# Patient Record
Sex: Male | Born: 1955
Health system: Southern US, Community
[De-identification: ages and names within clinical notes are randomized; demographics above are authoritative.]

## PROBLEM LIST (undated history)

## (undated) DIAGNOSIS — F79 Unspecified intellectual disabilities: Secondary | ICD-10-CM

## (undated) DIAGNOSIS — F419 Anxiety disorder, unspecified: Secondary | ICD-10-CM

## (undated) DIAGNOSIS — I2510 Atherosclerotic heart disease of native coronary artery without angina pectoris: Secondary | ICD-10-CM

## (undated) DIAGNOSIS — K429 Umbilical hernia without obstruction or gangrene: Secondary | ICD-10-CM

## (undated) DIAGNOSIS — R479 Unspecified speech disturbances: Secondary | ICD-10-CM

## (undated) DIAGNOSIS — M1611 Unilateral primary osteoarthritis, right hip: Secondary | ICD-10-CM

## (undated) DIAGNOSIS — Z9911 Dependence on respirator [ventilator] status: Secondary | ICD-10-CM

## (undated) DIAGNOSIS — M1990 Unspecified osteoarthritis, unspecified site: Secondary | ICD-10-CM

## (undated) DIAGNOSIS — F801 Expressive language disorder: Secondary | ICD-10-CM

## (undated) DIAGNOSIS — I10 Essential (primary) hypertension: Secondary | ICD-10-CM

## (undated) DIAGNOSIS — Q048 Other specified congenital malformations of brain: Secondary | ICD-10-CM

## (undated) DIAGNOSIS — M19012 Primary osteoarthritis, left shoulder: Secondary | ICD-10-CM

## (undated) DIAGNOSIS — M1612 Unilateral primary osteoarthritis, left hip: Secondary | ICD-10-CM

## (undated) DIAGNOSIS — K5900 Constipation, unspecified: Secondary | ICD-10-CM

## (undated) DIAGNOSIS — I7121 Aneurysm of the ascending aorta, without rupture: Secondary | ICD-10-CM

## (undated) DIAGNOSIS — R4189 Other symptoms and signs involving cognitive functions and awareness: Secondary | ICD-10-CM

## (undated) DIAGNOSIS — Z87442 Personal history of urinary calculi: Secondary | ICD-10-CM

## (undated) DIAGNOSIS — N201 Calculus of ureter: Secondary | ICD-10-CM

## (undated) HISTORY — PX: TONSILLECTOMY: SUR1361

## (undated) HISTORY — DX: Dependence on respirator (ventilator) status: Z99.11

## (undated) HISTORY — PX: BACK SURGERY: SHX140

---

## 2005-02-18 ENCOUNTER — Encounter: Admission: RE | Admit: 2005-02-18 | Discharge: 2005-02-18 | Payer: Self-pay | Admitting: General Surgery

## 2005-03-04 ENCOUNTER — Ambulatory Visit (HOSPITAL_BASED_OUTPATIENT_CLINIC_OR_DEPARTMENT_OTHER): Admission: RE | Admit: 2005-03-04 | Discharge: 2005-03-04 | Payer: Self-pay | Admitting: General Surgery

## 2005-03-04 ENCOUNTER — Ambulatory Visit (HOSPITAL_COMMUNITY): Admission: RE | Admit: 2005-03-04 | Discharge: 2005-03-04 | Payer: Self-pay | Admitting: General Surgery

## 2005-03-04 HISTORY — PX: INGUINAL HERNIA REPAIR: SUR1180

## 2006-10-19 ENCOUNTER — Encounter: Admission: RE | Admit: 2006-10-19 | Discharge: 2006-10-19 | Payer: Self-pay | Admitting: Internal Medicine

## 2009-03-18 ENCOUNTER — Encounter: Admission: RE | Admit: 2009-03-18 | Discharge: 2009-03-18 | Payer: Self-pay | Admitting: Gastroenterology

## 2009-10-12 HISTORY — PX: TOTAL SHOULDER ARTHROPLASTY: SHX126

## 2010-08-05 ENCOUNTER — Encounter: Admission: RE | Admit: 2010-08-05 | Discharge: 2010-08-05 | Payer: Self-pay | Admitting: Orthopedic Surgery

## 2010-08-26 ENCOUNTER — Inpatient Hospital Stay (HOSPITAL_COMMUNITY): Admission: RE | Admit: 2010-08-26 | Discharge: 2010-08-28 | Payer: Self-pay | Admitting: Orthopedic Surgery

## 2010-08-26 HISTORY — PX: OTHER SURGICAL HISTORY: SHX169

## 2010-12-23 LAB — BASIC METABOLIC PANEL
BUN: 8 mg/dL (ref 6–23)
CO2: 27 mEq/L (ref 19–32)
Chloride: 102 mEq/L (ref 96–112)
Chloride: 104 mEq/L (ref 96–112)
Chloride: 105 mEq/L (ref 96–112)
Creatinine, Ser: 0.62 mg/dL (ref 0.4–1.5)
GFR calc Af Amer: >60 mL/min (ref >60)
GFR calc Af Amer: >60 mL/min (ref >60)
GFR calc non Af Amer: >60 mL/min (ref >60)
GFR calc non Af Amer: >60 mL/min (ref >60)
Potassium: 4.1 mEq/L (ref 3.5–5.1)
Potassium: 4.2 mEq/L (ref 3.5–5.1)
Potassium: 4.3 mEq/L (ref 3.5–5.1)
Sodium: 135 mEq/L (ref 135–145)
Sodium: 138 mEq/L (ref 135–145)

## 2010-12-23 LAB — SURGICAL PCR SCREEN
MRSA, PCR: NEGATIVE
Staphylococcus aureus: NEGATIVE

## 2010-12-23 LAB — CBC
HCT: 33.6 % — ABNORMAL LOW (ref 39.0–52.0)
HCT: 34.2 % — ABNORMAL LOW (ref 39.0–52.0)
Hemoglobin: 11.9 g/dL — ABNORMAL LOW (ref 13.0–17.0)
Hemoglobin: 14.4 g/dL (ref 13.0–17.0)
MCH: 34.5 pg — ABNORMAL HIGH (ref 26.0–34.0)
MCV: 100.3 fL — ABNORMAL HIGH (ref 78.0–100.0)
MCV: 98.3 fL (ref 78.0–100.0)
MCV: 99 fL (ref 78.0–100.0)
Platelets: 212 10*3/uL (ref 150–400)
RBC: 3.35 MIL/uL — ABNORMAL LOW (ref 4.22–5.81)
RBC: 4.17 MIL/uL — ABNORMAL LOW (ref 4.22–5.81)
RDW: 12.9 % (ref 11.5–15.5)
WBC: 8.4 10*3/uL (ref 4.0–10.5)
WBC: 9.5 10*3/uL (ref 4.0–10.5)

## 2011-02-27 NOTE — Op Note (Signed)
NAME:  Joe [name], Joe [name]               ACCOUNT NO.:  [id number]   MEDICAL RECORD NO.:  [id number]          PATIENT TYPE:  AMB   LOCATION:  [facility]                         FACILITY:  [facility]   PHYSICIAN:  Anselm Pancoast. Zachery Dakins, M.D.DATE OF BIRTH:  [birth year]   DATE OF PROCEDURE:  03/04/2005  DATE OF DISCHARGE:                                 OPERATIVE REPORT   REFERRING PHYSICIAN:  Olene Craven, M.D.   PREOPERATIVE DIAGNOSIS:  Left inguinal hernia.   POSTOPERATIVE DIAGNOSIS:  Left inguinal hernia.   OPERATION:  Left inguinal herniorrhaphy with mesh reinforcement.   ANESTHESIA:  General anesthesia.   SURGEON:  Anselm Pancoast. Zachery Dakins, M.D.   ASSISTANT:  Nurse.   HISTORY:  Javi Bollman is a 55 year old Caucasian male who was referred to  me by Dr. Barbee Shropshire for a symptomatic left inguinal hernia.  Pao is mentally  retarded, has a mild speech impairment, but has no difficulty communicating  with you.  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  Patient has a left bulge that has been present for several weeks  and sometimes his work does require fairly heavy lifting and he is not on  any type of chronic home medications.  His only previous surgery has been a  tonsillectomy.  He denies any difficulty voiding and does not smoke.  Patient lives with his mother but does drive and we are planning to keep him  out of work approximately three weeks because of the heavy nature of his  work.  Patient preoperatively, was taken to the operating suite, induction  of general anesthesia and __________ he had already shaved the groin area.  We had marked the left side, identified the patient and prepped him with  Betadine surgical solution.  Patient has been given a gram of Kefzol and  after draping in a sterile manner, a left inguinal incision area was  infiltrated with 0.5% Marcaine with adrenalin and the ilioinguinal nerve  area was infiltrated with blunted 21-gauge  needle for nerve block.  Sharp  dissection into the skin and subcutaneous tissue.  External oblique  aponeurosis was opened through the external ring.  The cord structures were  elevated and there was a definite large lipoma herniated through the  internal ring area and after this had been removed and the pedicle ligated  with 4-0 Vicryl, a indirect hernia sac about two inches in length was  separated from the cord structures, opened and a high sac ligation done was  2-0 Surgilon under direct vision.  This retracted up into the preperitoneal  space nicely.  He has kind of a weakness of the floor and I closed this with  a __________ modified __________ running 2-0 Prolene and then taking it by  suturing the two ends together.  The reinforced internal ring should not be  tight enough to cause any testicular swelling.  A piece of mesh like a sail  slit laterally was then used to reinforce the floor starting at the  symphysis pubis with a running 2-0 Prolene and the two tails were sutured  together laterally reinforcing the internal ring.  The superior flap was  sutured down with interrupted 2-0 Vicryl and the ilioinguinal nerve comes up  much higher than the typical and I placed the tail of the mesh over it, not  really elevating and trying to bring this out through the recreated internal  ring area.  The mesh is lying flat and not under excessive tension and then  the external oblique was closed with running 3-0 Vicryl.  Scarpa's fascia closed with interrupted 3-0 Vicryl, 4-0 Dexon subcuticular  and Benzoin and Steri-Strips on the skin.  The patient tolerated the  procedure nicely.  Sterile occlusive dressing has been applied.  He was sent  to the recovery room in a stable postoperative condition.      WJW/MEDQ  D:  03/04/2005  T:  03/04/2005  Job:  [id number]   cc:   Olene Craven, M.D.  [address]  [address]  [place]  [place] [address]  Fax: [phone]

## 2011-05-13 ENCOUNTER — Inpatient Hospital Stay (HOSPITAL_COMMUNITY): Admission: RE | Admit: 2011-05-13 | Payer: Medicare Other | Source: Ambulatory Visit | Admitting: Orthopedic Surgery

## 2011-08-18 ENCOUNTER — Encounter (HOSPITAL_COMMUNITY): Payer: Self-pay | Admitting: Pharmacy Technician

## 2011-08-19 ENCOUNTER — Other Ambulatory Visit: Payer: Self-pay

## 2011-08-19 ENCOUNTER — Other Ambulatory Visit: Payer: Self-pay | Admitting: Orthopedic Surgery

## 2011-08-19 ENCOUNTER — Encounter (HOSPITAL_COMMUNITY)
Admission: RE | Admit: 2011-08-19 | Discharge: 2011-08-19 | Disposition: A | Discharge status: Home / Self Care | Payer: Medicare Other | Source: Ambulatory Visit | Attending: Orthopedic Surgery | Admitting: Orthopedic Surgery

## 2011-08-19 ENCOUNTER — Encounter (HOSPITAL_COMMUNITY): Payer: Self-pay

## 2011-08-19 HISTORY — DX: Anxiety disorder, unspecified: F41.9

## 2011-08-19 HISTORY — DX: Unspecified osteoarthritis, unspecified site: M19.90

## 2011-08-19 LAB — CBC
HCT: 41.8 % (ref 39.0–52.0)
MCH: 35 pg — ABNORMAL HIGH (ref 26.0–34.0)
MCHC: 35.9 g/dL (ref 30.0–36.0)
MCV: 97.4 fL (ref 78.0–100.0)
Platelets: 258 10*3/uL (ref 150–400)
RDW: 12.5 % (ref 11.5–15.5)
WBC: 7.1 10*3/uL (ref 4.0–10.5)

## 2011-08-19 LAB — DIFFERENTIAL
Lymphocytes Relative: 24 % (ref 12–46)
Monocytes Absolute: 0.5 10*3/uL (ref 0.1–1.0)
Monocytes Relative: 7 % (ref 3–12)
Neutro Abs: 4.9 10*3/uL (ref 1.7–7.7)
Neutrophils Relative %: 69 % (ref 43–77)

## 2011-08-19 LAB — COMPREHENSIVE METABOLIC PANEL
AST: 15 U/L (ref 0–37)
Albumin: 4.4 g/dL (ref 3.5–5.2)
BUN: 16 mg/dL (ref 6–23)
Calcium: 10.5 mg/dL (ref 8.4–10.5)
Creatinine, Ser: 0.66 mg/dL (ref 0.50–1.35)
Total Protein: 7.6 g/dL (ref 6.0–8.3)

## 2011-08-19 LAB — URINALYSIS, ROUTINE W REFLEX MICROSCOPIC
Glucose, UA: NEGATIVE mg/dL
Hgb urine dipstick: NEGATIVE
Ketones, ur: 15 mg/dL — AB
Leukocytes, UA: NEGATIVE
Protein, ur: NEGATIVE mg/dL
Urobilinogen, UA: 0.2 mg/dL (ref 0.0–1.0)

## 2011-08-19 LAB — PROTIME-INR: INR: 1.03 (ref 0.00–1.49)

## 2011-08-19 LAB — SURGICAL PCR SCREEN: MRSA, PCR: NEGATIVE

## 2011-08-19 LAB — TYPE AND SCREEN
ABO/RH(D): A POS
Antibody Screen: NEGATIVE

## 2011-08-19 NOTE — Pre-Procedure Instructions (Signed)
20 Dina A Bodin  08/19/2011   Your procedure is scheduled on:  08/27/2011  Report to [facility] at 5:30 AM.  Call this number if you have problems the morning of surgery: [phone]   Remember: Bring Incentive Spirometer back in suitcase day of surgery.   Do not eat food:After Midnight.  Do not drink clear liquids: 4 Hours before arrival.  Take these medicines the morning of surgery with A SIP OF WATER: Oxycodone   Do not wear jewelry, make-up or nail polish.  Do not wear lotions, powders, or perfumes. You may wear deodorant.  Do not shave 48 hours prior to surgery.  Do not bring valuables to the hospital.  Contacts, dentures or bridgework may not be worn into surgery.  Leave suitcase in the car. After surgery it may be brought to your room.  For patients admitted to the hospital, checkout time is 11:00 AM the day of discharge.   Patients discharged the day of surgery will not be allowed to drive home.  Name and phone number of your driver: [name] mother [phone]  Special Instructions: CHG Shower Use Special Wash: 1/2 bottle night before surgery and 1/2 bottle morning of surgery.   Please read over the following fact sheets that you were given: Pain Booklet, Coughing and Deep Breathing, Blood Transfusion Information, MRSA Information and Surgical Site Infection Prevention

## 2011-08-26 ENCOUNTER — Encounter (HOSPITAL_COMMUNITY): Admission: RE | Payer: Self-pay | Source: Ambulatory Visit

## 2011-08-26 ENCOUNTER — Inpatient Hospital Stay (HOSPITAL_COMMUNITY): Admission: RE | Admit: 2011-08-26 | Payer: Medicare Other | Source: Ambulatory Visit | Admitting: Orthopedic Surgery

## 2011-08-26 SURGERY — POSTERIOR LUMBAR FUSION 2 LEVEL
Anesthesia: General

## 2011-08-27 ENCOUNTER — Inpatient Hospital Stay (HOSPITAL_COMMUNITY): Payer: Medicare Other | Admitting: Anesthesiology

## 2011-08-27 ENCOUNTER — Inpatient Hospital Stay (HOSPITAL_COMMUNITY): Payer: Medicare Other

## 2011-08-27 ENCOUNTER — Encounter (HOSPITAL_COMMUNITY): Payer: Self-pay | Admitting: Anesthesiology

## 2011-08-27 ENCOUNTER — Inpatient Hospital Stay (HOSPITAL_COMMUNITY)
Admission: RE | Admit: 2011-08-27 | Discharge: 2011-08-29 | DRG: 460 | Disposition: A | Discharge status: Home / Self Care | Payer: Medicare Other | Source: Ambulatory Visit | Attending: Orthopedic Surgery | Admitting: Orthopedic Surgery

## 2011-08-27 ENCOUNTER — Encounter (HOSPITAL_COMMUNITY)
Admission: RE | Disposition: A | Discharge status: Home / Self Care | Payer: Self-pay | Source: Ambulatory Visit | Attending: Orthopedic Surgery

## 2011-08-27 ENCOUNTER — Encounter (HOSPITAL_COMMUNITY): Payer: Self-pay | Admitting: *Deleted

## 2011-08-27 DIAGNOSIS — Z79899 Other long term (current) drug therapy: Secondary | ICD-10-CM

## 2011-08-27 DIAGNOSIS — Q762 Congenital spondylolisthesis: Principal | ICD-10-CM

## 2011-08-27 DIAGNOSIS — F411 Generalized anxiety disorder: Secondary | ICD-10-CM | POA: Diagnosis present

## 2011-08-27 DIAGNOSIS — M79605 Pain in left leg: Secondary | ICD-10-CM | POA: Insufficient documentation

## 2011-08-27 DIAGNOSIS — M48061 Spinal stenosis, lumbar region without neurogenic claudication: Secondary | ICD-10-CM | POA: Diagnosis present

## 2011-08-27 DIAGNOSIS — Z01818 Encounter for other preprocedural examination: Secondary | ICD-10-CM

## 2011-08-27 DIAGNOSIS — Z01811 Encounter for preprocedural respiratory examination: Secondary | ICD-10-CM

## 2011-08-27 DIAGNOSIS — M129 Arthropathy, unspecified: Secondary | ICD-10-CM | POA: Diagnosis present

## 2011-08-27 DIAGNOSIS — Z01812 Encounter for preprocedural laboratory examination: Secondary | ICD-10-CM

## 2011-08-27 DIAGNOSIS — Z01810 Encounter for preprocedural cardiovascular examination: Secondary | ICD-10-CM

## 2011-08-27 DIAGNOSIS — Z96619 Presence of unspecified artificial shoulder joint: Secondary | ICD-10-CM

## 2011-08-27 HISTORY — PX: POSTERIOR LUMBAR FUSION: SHX6036

## 2011-08-27 SURGERY — POSTERIOR LUMBAR FUSION 1 LEVEL
Anesthesia: General | Site: Back | Wound class: Clean

## 2011-08-27 MED ORDER — MENTHOL 3 MG MT LOZG
1.0000 | LOZENGE | OROMUCOSAL | Status: DC | PRN
  Filled 2011-08-27: qty 9

## 2011-08-27 MED ORDER — NALOXONE HCL 0.4 MG/ML IJ SOLN: 0.4000 mg | INTRAMUSCULAR | Status: DC | PRN

## 2011-08-27 MED ORDER — SODIUM CHLORIDE 0.9 % IV SOLN: 250.0000 mL | INTRAVENOUS | Status: DC

## 2011-08-27 MED ORDER — DIPHENHYDRAMINE HCL 12.5 MG/5ML PO ELIX
12.5000 mg | ORAL_SOLUTION | Freq: Four times a day (QID) | ORAL | Status: DC | PRN
  Filled 2011-08-27: qty 5

## 2011-08-27 MED ORDER — POTASSIUM CHLORIDE IN NACL 20-0.9 MEQ/L-% IV SOLN
INTRAVENOUS | Status: DC
  Filled 2011-08-27 (×4): qty 1000

## 2011-08-27 MED ORDER — ALBUMIN HUMAN 25 % IV SOLN: INTRAVENOUS | Status: DC | PRN

## 2011-08-27 MED ORDER — OXYCODONE-ACETAMINOPHEN 5-325 MG PO TABS
1.0000 | ORAL_TABLET | ORAL | Status: DC | PRN
  Administered 2011-08-27 – 2011-08-29 (×5): 2 via ORAL
  Filled 2011-08-27 (×5): qty 2

## 2011-08-27 MED ORDER — SODIUM CHLORIDE 0.9 % IJ SOLN
3.0000 mL | Freq: Two times a day (BID) | INTRAMUSCULAR | Status: DC
  Administered 2011-08-27 – 2011-08-28 (×3): 3 mL via INTRAVENOUS

## 2011-08-27 MED ORDER — ONDANSETRON HCL 4 MG/2ML IJ SOLN
INTRAMUSCULAR | Status: DC | PRN
  Administered 2011-08-27: 4 mg via INTRAVENOUS

## 2011-08-27 MED ORDER — SODIUM CHLORIDE 0.9 % IV SOLN
INTRAVENOUS | Status: DC | PRN
  Administered 2011-08-27: 11:00:00 via INTRAVENOUS

## 2011-08-27 MED ORDER — PHENYLEPHRINE HCL 10 MG/ML IJ SOLN
20.0000 mg | INTRAVENOUS | Status: DC | PRN
  Administered 2011-08-27: 50 ug/min via INTRAVENOUS

## 2011-08-27 MED ORDER — NEOSTIGMINE METHYLSULFATE 1 MG/ML IJ SOLN
INTRAMUSCULAR | Status: DC | PRN
  Administered 2011-08-27: 4 mg via INTRAVENOUS

## 2011-08-27 MED ORDER — HEMOSTATIC AGENTS (NO CHARGE) OPTIME
TOPICAL | Status: DC | PRN
  Administered 2011-08-27 (×3): 1 via TOPICAL

## 2011-08-27 MED ORDER — DIPHENHYDRAMINE HCL 50 MG/ML IJ SOLN: 12.5000 mg | Freq: Four times a day (QID) | INTRAMUSCULAR | Status: DC | PRN

## 2011-08-27 MED ORDER — SODIUM CHLORIDE 0.9 % IJ SOLN: 9.0000 mL | INTRAMUSCULAR | Status: DC | PRN

## 2011-08-27 MED ORDER — MAGNESIUM HYDROXIDE 400 MG/5ML PO SUSP: 30.0000 mL | Freq: Two times a day (BID) | ORAL | Status: DC | PRN

## 2011-08-27 MED ORDER — ROCURONIUM BROMIDE 100 MG/10ML IV SOLN
INTRAVENOUS | Status: DC | PRN
  Administered 2011-08-27: 30 mg via INTRAVENOUS
  Administered 2011-08-27: 50 mg via INTRAVENOUS
  Administered 2011-08-27: 10 mg via INTRAVENOUS
  Administered 2011-08-27 (×2): 20 mg via INTRAVENOUS

## 2011-08-27 MED ORDER — CEFAZOLIN SODIUM 1-5 GM-% IV SOLN
INTRAVENOUS | Status: DC | PRN
  Administered 2011-08-27: 2 g via INTRAVENOUS

## 2011-08-27 MED ORDER — PROPOFOL 10 MG/ML IV EMUL
INTRAVENOUS | Status: DC | PRN
  Administered 2011-08-27: 180 mg via INTRAVENOUS

## 2011-08-27 MED ORDER — THROMBIN 20000 UNITS EX KIT
PACK | CUTANEOUS | Status: DC | PRN
  Administered 2011-08-27: 09:00:00 via TOPICAL

## 2011-08-27 MED ORDER — BUPIVACAINE-EPINEPHRINE 0.25% -1:200000 IJ SOLN
INTRAMUSCULAR | Status: DC | PRN
  Administered 2011-08-27: 6 mL

## 2011-08-27 MED ORDER — ONDANSETRON HCL 4 MG/2ML IJ SOLN: 4.0000 mg | Freq: Four times a day (QID) | INTRAMUSCULAR | Status: DC | PRN

## 2011-08-27 MED ORDER — ACETAMINOPHEN 650 MG RE SUPP: 650.0000 mg | RECTAL | Status: DC | PRN

## 2011-08-27 MED ORDER — SENNA 8.6 MG PO TABS
1.0000 | ORAL_TABLET | Freq: Two times a day (BID) | ORAL | Status: DC
  Administered 2011-08-27 – 2011-08-29 (×4): 8.6 mg via ORAL
  Filled 2011-08-27 (×6): qty 1

## 2011-08-27 MED ORDER — ALUM & MAG HYDROXIDE-SIMETH 400-400-40 MG/5ML PO SUSP
30.0000 mL | Freq: Four times a day (QID) | ORAL | Status: DC | PRN
  Filled 2011-08-27: qty 30

## 2011-08-27 MED ORDER — ACETAMINOPHEN 325 MG PO TABS: 650.0000 mg | ORAL_TABLET | ORAL | Status: DC | PRN

## 2011-08-27 MED ORDER — MORPHINE SULFATE (PF) 1 MG/ML IV SOLN
INTRAVENOUS | Status: DC
  Administered 2011-08-27: 1 mg via INTRAVENOUS
  Administered 2011-08-27: 15:00:00 via INTRAVENOUS
  Administered 2011-08-28: 1 mg via INTRAVENOUS

## 2011-08-27 MED ORDER — ACETAMINOPHEN 10 MG/ML IV SOLN
1000.0000 mg | Freq: Four times a day (QID) | INTRAVENOUS | Status: DC
  Administered 2011-08-27: 1000 mg via INTRAVENOUS
  Filled 2011-08-27 (×4): qty 100

## 2011-08-27 MED ORDER — SODIUM CHLORIDE 0.9 % IR SOLN
Status: DC | PRN
  Administered 2011-08-27 (×2): 1000 mL

## 2011-08-27 MED ORDER — MORPHINE SULFATE 2 MG/ML IJ SOLN: 2.0000 mg | INTRAMUSCULAR | Status: DC | PRN

## 2011-08-27 MED ORDER — HYDROXYZINE HCL 50 MG PO TABS
50.0000 mg | ORAL_TABLET | ORAL | Status: DC | PRN
  Filled 2011-08-27: qty 1

## 2011-08-27 MED ORDER — LACTATED RINGERS IV SOLN
INTRAVENOUS | Status: DC | PRN
  Administered 2011-08-27 (×2): via INTRAVENOUS

## 2011-08-27 MED ORDER — HYDROMORPHONE HCL PF 1 MG/ML IJ SOLN
0.2500 mg | INTRAMUSCULAR | Status: DC | PRN
  Filled 2011-08-27: qty 0.5

## 2011-08-27 MED ORDER — HYDROXYZINE HCL 50 MG/ML IM SOLN
50.0000 mg | INTRAMUSCULAR | Status: DC | PRN
  Filled 2011-08-27: qty 1

## 2011-08-27 MED ORDER — MIDAZOLAM HCL 5 MG/5ML IJ SOLN
INTRAMUSCULAR | Status: DC | PRN
  Administered 2011-08-27: 2 mg via INTRAVENOUS

## 2011-08-27 MED ORDER — LIDOCAINE HCL (CARDIAC) 20 MG/ML IV SOLN
INTRAVENOUS | Status: DC | PRN
  Administered 2011-08-27: 100 mg via INTRAVENOUS

## 2011-08-27 MED ORDER — DIAZEPAM 5 MG PO TABS
5.0000 mg | ORAL_TABLET | ORAL | Status: DC
  Administered 2011-08-27 – 2011-08-29 (×11): 5 mg via ORAL
  Filled 2011-08-27 (×11): qty 1

## 2011-08-27 MED ORDER — ZOLPIDEM TARTRATE 5 MG PO TABS: 5.0000 mg | ORAL_TABLET | Freq: Every evening | ORAL | Status: DC | PRN

## 2011-08-27 MED ORDER — LACTATED RINGERS IV SOLN
INTRAVENOUS | Status: DC | PRN
  Administered 2011-08-27 (×2): via INTRAVENOUS

## 2011-08-27 MED ORDER — CEFAZOLIN SODIUM 1-5 GM-% IV SOLN
1.0000 g | Freq: Three times a day (TID) | INTRAVENOUS | Status: AC
  Administered 2011-08-27 – 2011-08-28 (×2): 1 g via INTRAVENOUS
  Filled 2011-08-27 (×2): qty 50

## 2011-08-27 MED ORDER — PHENOL 1.4 % MT LIQD
1.0000 | OROMUCOSAL | Status: DC | PRN
  Filled 2011-08-27: qty 177

## 2011-08-27 MED ORDER — ALBUMIN HUMAN 5 % IV SOLN: INTRAVENOUS | Status: DC | PRN

## 2011-08-27 MED ORDER — DEXTROSE 5 % IV SOLN
INTRAVENOUS | Status: DC | PRN
  Administered 2011-08-27 (×2): via INTRAVENOUS

## 2011-08-27 MED ORDER — GLYCOPYRROLATE 0.2 MG/ML IJ SOLN
INTRAMUSCULAR | Status: DC | PRN
  Administered 2011-08-27: .8 mg via INTRAVENOUS
  Administered 2011-08-27: .4 mg via INTRAVENOUS

## 2011-08-27 MED ORDER — SODIUM CHLORIDE 0.9 % IV SOLN: INTRAVENOUS | Status: DC

## 2011-08-27 MED ORDER — FENTANYL CITRATE 0.05 MG/ML IJ SOLN
INTRAMUSCULAR | Status: DC | PRN
  Administered 2011-08-27: 75 ug via INTRAVENOUS
  Administered 2011-08-27: 50 ug via INTRAVENOUS
  Administered 2011-08-27: 125 ug via INTRAVENOUS

## 2011-08-27 MED ORDER — DEXAMETHASONE SODIUM PHOSPHATE 10 MG/ML IJ SOLN
INTRAMUSCULAR | Status: DC | PRN
  Administered 2011-08-27: 10 mg via INTRAVENOUS

## 2011-08-27 MED ORDER — DOCUSATE SODIUM 100 MG PO CAPS
100.0000 mg | ORAL_CAPSULE | Freq: Two times a day (BID) | ORAL | Status: DC
  Administered 2011-08-27 – 2011-08-28 (×3): 100 mg via ORAL
  Filled 2011-08-27 (×4): qty 1

## 2011-08-27 MED ORDER — SODIUM CHLORIDE 0.9 % IJ SOLN: 3.0000 mL | INTRAMUSCULAR | Status: DC | PRN

## 2011-08-27 MED ORDER — ALBUMIN HUMAN 5 % IV SOLN
INTRAVENOUS | Status: DC | PRN
  Administered 2011-08-27 (×2): via INTRAVENOUS

## 2011-08-27 SURGICAL SUPPLY — 80 items
BENZOIN TINCTURE PRP APPL 2/3 (GAUZE/BANDAGES/DRESSINGS) ×2 IMPLANT
BLADE SURG ROTATE 9660 (MISCELLANEOUS) IMPLANT
BUR ROUND PRECISION 4.0 (BURR) ×2 IMPLANT
CARTRIDGE OIL MAESTRO DRILL (MISCELLANEOUS) IMPLANT
CLOTH BEACON ORANGE TIMEOUT ST (SAFETY) ×2 IMPLANT
CONT SPEC STER OR (MISCELLANEOUS) ×2 IMPLANT
CORDS BIPOLAR (ELECTRODE) ×2 IMPLANT
COVER SURGICAL LIGHT HANDLE (MISCELLANEOUS) ×2 IMPLANT
DIFFUSER DRILL AIR PNEUMATIC (MISCELLANEOUS) ×2 IMPLANT
DRAIN CHANNEL 15F RND FF W/TCR (WOUND CARE) ×2 IMPLANT
DRAPE C-ARM 42X72 X-RAY (DRAPES) ×2 IMPLANT
DRAPE ORTHO SPLIT 77X108 STRL (DRAPES) ×1
DRAPE POUCH INSTRU U-SHP 10X18 (DRAPES) ×2 IMPLANT
DRAPE SURG 17X23 STRL (DRAPES) ×6 IMPLANT
DRAPE SURG ORHT 6 SPLT 77X108 (DRAPES) ×1 IMPLANT
DURAPREP 26ML APPLICATOR (WOUND CARE) ×2 IMPLANT
ELECT BLADE 4.0 EZ CLEAN MEGAD (MISCELLANEOUS) ×2
ELECT CAUTERY BLADE 6.4 (BLADE) ×2 IMPLANT
ELECT REM PT RETURN 9FT ADLT (ELECTROSURGICAL) ×2
ELECTRODE BLDE 4.0 EZ CLN MEGD (MISCELLANEOUS) ×1 IMPLANT
ELECTRODE REM PT RTRN 9FT ADLT (ELECTROSURGICAL) ×1 IMPLANT
EVACUATOR SILICONE 100CC (DRAIN) ×2 IMPLANT
GAUZE SPONGE 4X4 12PLY STRL LF (GAUZE/BANDAGES/DRESSINGS) ×2 IMPLANT
GAUZE SPONGE 4X4 16PLY XRAY LF (GAUZE/BANDAGES/DRESSINGS) ×4 IMPLANT
GLOVE BIO SURGEON STRL SZ 6.5 (GLOVE) ×4 IMPLANT
GLOVE BIO SURGEON STRL SZ7.5 (GLOVE) ×4 IMPLANT
GLOVE BIO SURGEON STRL SZ8 (GLOVE) ×6 IMPLANT
GLOVE BIOGEL M 7.0 STRL (GLOVE) ×2 IMPLANT
GLOVE BIOGEL PI IND STRL 6.5 (GLOVE) ×1 IMPLANT
GLOVE BIOGEL PI IND STRL 7.5 (GLOVE) ×3 IMPLANT
GLOVE BIOGEL PI IND STRL 8 (GLOVE) ×1 IMPLANT
GLOVE BIOGEL PI INDICATOR 6.5 (GLOVE) ×1
GLOVE BIOGEL PI INDICATOR 7.5 (GLOVE) ×3
GLOVE BIOGEL PI INDICATOR 8 (GLOVE) ×1
GLOVE SURG SS PI 7.5 STRL IVOR (GLOVE) ×6 IMPLANT
GOWN PREVENTION PLUS XLARGE (GOWN DISPOSABLE) ×8 IMPLANT
GOWN STRL NON-REIN LRG LVL3 (GOWN DISPOSABLE) ×2 IMPLANT
IV CATH 14GX2 1/4 (CATHETERS) ×2 IMPLANT
KIT BASIN OR (CUSTOM PROCEDURE TRAY) ×2 IMPLANT
KIT POSITION SURG JACKSON T1 (MISCELLANEOUS) IMPLANT
KIT ROOM TURNOVER OR (KITS) ×2 IMPLANT
MARKER SKIN DUAL TIP RULER LAB (MISCELLANEOUS) ×2 IMPLANT
MILL MEDIUM DISP (BLADE) ×2 IMPLANT
NEEDLE BONE MARROW 8GX6 FENEST (NEEDLE) ×2 IMPLANT
NEEDLE HYPO 25GX1X1/2 BEV (NEEDLE) ×2 IMPLANT
NEEDLE SPNL 18GX3.5 QUINCKE PK (NEEDLE) ×2 IMPLANT
NS IRRIG 1000ML POUR BTL (IV SOLUTION) ×2 IMPLANT
OIL CARTRIDGE MAESTRO DRILL (MISCELLANEOUS)
PACK LAMINECTOMY ORTHO (CUSTOM PROCEDURE TRAY) ×2 IMPLANT
PACK UNIVERSAL I (CUSTOM PROCEDURE TRAY) ×2 IMPLANT
PACK VITOSS BIOACTIVE 5CC (Neuro Prosthesis/Implant) ×2 IMPLANT
PAD ARMBOARD 7.5X6 YLW CONV (MISCELLANEOUS) ×4 IMPLANT
PATTIES SURGICAL .5 X1 (DISPOSABLE) ×2 IMPLANT
PATTIES SURGICAL .5X1.5 (GAUZE/BANDAGES/DRESSINGS) IMPLANT
ROD EXPEDIUM PREBENT 5.5X30MM (Rod) ×2 IMPLANT
ROD EXPEDIUM PREBENT 5.5X35MM (Rod) ×2 IMPLANT
SCREW EXPEDIUM POLYAXIAL 7X40M (Screw) ×2 IMPLANT
SCREW POLYAXIAL 7X45MM (Screw) ×6 IMPLANT
SCREW SET SINGLE INNER (Screw) ×8 IMPLANT
SLEEVE SURGEON STRL (DRAPES) ×2 IMPLANT
SPONGE GAUZE 4X4 12PLY (GAUZE/BANDAGES/DRESSINGS) ×2 IMPLANT
SPONGE INTESTINAL PEANUT (DISPOSABLE) ×2 IMPLANT
SPONGE SURGIFOAM ABS GEL 100 (HEMOSTASIS) ×2 IMPLANT
STRIP CLOSURE SKIN 1/2X4 (GAUZE/BANDAGES/DRESSINGS) ×2 IMPLANT
SURGIFLO TRUKIT (HEMOSTASIS) IMPLANT
SUT MNCRL AB 3-0 PS2 18 (SUTURE) IMPLANT
SUT MNCRL AB 4-0 PS2 18 (SUTURE) ×2 IMPLANT
SUT VIC AB 0 CT1 18XCR BRD 8 (SUTURE) ×1 IMPLANT
SUT VIC AB 0 CT1 8-18 (SUTURE) ×1
SUT VIC AB 1 CT1 18XCR BRD 8 (SUTURE) ×1 IMPLANT
SUT VIC AB 1 CT1 8-18 (SUTURE) ×1
SUT VIC AB 2-0 CT2 18 VCP726D (SUTURE) ×4 IMPLANT
SYR 20CC LL (SYRINGE) ×2 IMPLANT
SYR BULB IRRIGATION 50ML (SYRINGE) ×2 IMPLANT
SYR CONTROL 10ML LL (SYRINGE) ×6 IMPLANT
TOWEL OR 17X24 6PK STRL BLUE (TOWEL DISPOSABLE) ×2 IMPLANT
TOWEL OR 17X26 10 PK STRL BLUE (TOWEL DISPOSABLE) ×2 IMPLANT
TRAY FOLEY CATH 14FR (SET/KITS/TRAYS/PACK) ×2 IMPLANT
WATER STERILE IRR 1000ML POUR (IV SOLUTION) ×2 IMPLANT
YANKAUER SUCT BULB TIP NO VENT (SUCTIONS) ×2 IMPLANT

## 2011-08-27 NOTE — H&P (Signed)
PREOPERATIVE H&P  Chief Complaint: left leg pain  HPI: Joe [name] is a 55 y.o. male who presents with left leg pain  Past Medical History  Diagnosis Date  . Arthritis   . Anxiety     over surgery   Past Surgical History  Procedure Date  . Shoulder surgery 08/2010    Replacement   History   Social History  . Marital Status: Single    Spouse Name: N/A    Number of Children: N/A  . Years of Education: N/A   Social History Main Topics  . Smoking status: Never Smoker   . Smokeless tobacco: None  . Alcohol Use:   . Drug Use:   . Sexually Active:    Other Topics Concern  . None   Social History Narrative  . None   History reviewed. No pertinent family history. No Known Allergies Prior to Admission medications   Medication Sig Start Date End Date Taking? Authorizing Provider  oxyCODONE-acetaminophen (PERCOCET) 5-325 MG per tablet Take 1 tablet by mouth every 4 (four) hours as needed. For pain    Yes Historical Provider, MD  naproxen sodium (ANAPROX) 220 MG tablet Take 220 mg by mouth daily as needed. For pain     Historical Provider, MD     All other systems have been reviewed and were otherwise negative with the exception of those mentioned in the HPI and as above.  Physical Exam: Filed Vitals:   08/27/11 0629  BP: 139/69  Pulse: 68  Temp: 98.3 F (36.8 C)  Resp: 18    General: Alert, no acute distress Cardiovascular: No pedal edema Respiratory: No cyanosis, no use of accessory musculature GI: No organomegaly, abdomen is soft and non-tender Skin: No lesions in the area of chief complaint Neurologic: Sensation intact distally Psychiatric: Patient is competent for consent with normal mood and affect Lymphatic: No axillary or cervical lymphadenopathy  MUSCULOSKELETAL: SLR on left   Assessment/Plan: left leg pain Plan for Procedure(s): L4-S1 decompression, L4/5 PSF with instrumentation   Ilias Stcharles,Sanders LEONARD 08/27/2011 7:07 AM

## 2011-08-27 NOTE — Progress Notes (Signed)
Utilization review completed.  

## 2011-08-27 NOTE — Preoperative (Signed)
Beta Blockers   Reason not to administer Beta Blockers:Not Applicable 

## 2011-08-27 NOTE — Anesthesia Preprocedure Evaluation (Addendum)
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  Performs ADL's w/o diffficulty.  Expressive delay.  Pt's mother said Romario completed [employer] in "Special Needs" Class. Negative Psych ROS   GI/Hepatic negative GI ROS, Neg liver ROS,   Endo/Other  Negative Endocrine ROS  Renal/GU negative Renal ROS  Genitourinary negative   Musculoskeletal negative musculoskeletal ROS (+) Arthritis -,   Abdominal   Peds  Hematology negative hematology ROS (+)   Anesthesia Other Findings   Reproductive/Obstetrics                        Anesthesia Physical Anesthesia Plan  ASA: II  Anesthesia Plan: General   Post-op Pain Management:    Induction: Intravenous  Airway Management Planned: Oral ETT  Additional Equipment:   Intra-op Plan:   Post-operative Plan: Extubation in OR  Informed Consent: I have reviewed the patients History and Physical, chart, labs and discussed the procedure including the risks, benefits and alternatives for the proposed anesthesia with the patient or authorized representative who has indicated his/her understanding and acceptance.   Dental advisory given  Plan Discussed with: CRNA and Surgeon  Anesthesia Plan Comments:        Anesthesia Quick Evaluation

## 2011-08-27 NOTE — Anesthesia Postprocedure Evaluation (Signed)
Anesthesia Post Note  Patient: Joe [name]  Procedure(s) Performed:  POSTERIOR LUMBAR FUSION 1 LEVEL - Laminectomy Lumbar Decompression L4-5 L5-S1 Lumbar Fusion L4-5 and Lumbar Fusion L4-5 with Bone Marrow Asprite  Anesthesia type: General  Patient location: PACU  Post pain: Pain level controlled and Adequate analgesia  Post assessment: Post-op Vital signs reviewed, Patient's Cardiovascular Status Stable, Respiratory Function Stable, Patent Airway and Pain level controlled  Last Vitals:  Filed Vitals:   08/27/11 1311  BP:   Pulse:   Temp: 37.3 C  Resp:     Post vital signs: Reviewed and stable  Level of consciousness: awake, alert  and oriented  Complications: No apparent anesthesia complications

## 2011-08-27 NOTE — Transfer of Care (Signed)
Immediate Anesthesia Transfer of Care Note  Patient: Joe [name]  Procedure(s) Performed:  POSTERIOR LUMBAR FUSION 1 LEVEL - Laminectomy Lumbar Decompression L4-5 L5-S1 Lumbar Fusion L4-5 and Lumbar Fusion L4-5 with Bone Marrow Asprite  Patient Location: PACU  Anesthesia Type: General  Level of Consciousness: awake, alert , oriented and patient cooperative  Airway & Oxygen Therapy: Patient Spontanous Breathing and Patient connected to nasal cannula oxygen  Post-op Assessment: Report given to PACU RN, Post -op Vital signs reviewed and stable and Patient moving all extremities  Post vital signs: Reviewed and stable  Complications: No apparent anesthesia complications

## 2011-08-27 NOTE — Op Note (Signed)
Joe [name], Joe [name]               ACCOUNT NO.:  [id number]  MEDICAL RECORD NO.:  [id number]  LOCATION:  5030                         FACILITY:  [facility]  PHYSICIAN:  Estill Bamberg, MD      DATE OF BIRTH:  Apr 18, 1956  DATE OF PROCEDURE:  08/27/2011 DATE OF DISCHARGE:                              OPERATIVE REPORT   PREOPERATIVE DIAGNOSES: 1. L4-5, L5-S1 spinal stenosis. 2. Grade 1 L4-5 spondylolisthesis.  POSTOPERATIVE DIAGNOSES: 1. L4-5, L5-S1 spinal stenosis. 2. Grade 1 L4-5 spondylolisthesis.  PROCEDURES: 1. L4-5, L5-S1 laminectomy, partial facetectomy, and foraminotomy. 2. Posterolateral fusion, L4-5. 3. Placement of posterior instrumentation, L4, L5. 4. Use of local autograft. 5. Intraoperative bone marrow aspiration from the patient's left iliac     crest, using a separate incision. 6. Intraoperative use of fluoroscopy.  SURGEON:  Estill Bamberg, M.D.  ASSISTANT:  Skip Mayer, PA-C.  ANESTHESIA:  General endotracheal anesthesia.  COMPLICATIONS:  None.  DISPOSITION:  Stable.  INSTRUMENTATION USED:  Expedium.  ESTIMATED BLOOD LOSS:  300 mL.  INDICATIONS FOR PROCEDURE:  Briefly, Mr. Imler is an extremely pleasant 55 year old male who presented to my office with severe debilitating pain in his left leg.  I did review an MRI, which was notable for rather profound spinal stenosis at the L4-5 and L5-S1 levels.  There was noted to be excessive facet hypertrophy at both the L4-5 and L5-S1 facets.  This did result in severe lateral recess stenosis bilaterally at L4-5 and left-sided neural foraminal stenosis at L5-S1.  Of note, we did go forward with extensive conservative care and the patient continued to have ongoing debilitating pain.  Of note, prior to the patient's nonoperative treatment, was a L5 selected nerve block on the left side and this did significantly alleviate the patient's symptomatology.  We therefore had a discussion regarding going forward with  two-level decompression and a 1 level fusion as noted above.  The patient fully understood the risks and limitations of the procedure as outlined in my preoperative note.  OPERATIVE DETAILS:  On August 27, 2011, the patient was brought to surgery and general endotracheal anesthesia was administered.  The patient was placed prone on a well-padded flat Jackson bed with a Wilson frame.  SCDs were placed and a time-out procedure was performed. Antibiotics were given.  The back was prepped and draped in usual sterile fashion.  I then made an incision from approximately spinous process of L3 to approximately spinous process of S1.  The fascia was incised using electrocautery at the midline and the paraspinal muscular gently swept laterally.  A lateral fluoroscopic view was obtained to help optimize the trajectory of the pedicle screws.  I then brought in lateral fluoroscopy and I did place a Kocher over the L4 spinous process and a lateral fluoroscopic view was obtained, which helped to confirm the appropriate operative levels.  The lamina of L4, L5 and S1 were subperiosteally exposed as was the transverse processes of L4 and L5 bilaterally.  Of note, there was noted to be excessive and profound hypertrophy of the L4-5 facet joints.  Again, the amount of hypertrophy was significant and extensive amount of this facet hypertrophy was taken down in order  to help optimize the exposure.  I then packed out the posterolateral gutters using Ray-Tec soaked with thrombin.  Prior to this, I did use a 4-mm high-speed bur followed by a gearshift probe to cannulate the L4 and L5 pedicles on both the right and the left sides. A ball-tip Feeler was used to confirm that there was no cortical violation.  A 6-mm tap was utilized and again, a ball-tip probe confirmed there was no cortical violation.  The holes were sealed with bone wax and the posterolateral gutters were packed.  I then went forward with a  thorough central and lateral recess and bilateral neural foraminal decompression at the L4-5 and L5-S1 levels in the usual manner.  At the termination of the decompression, I was easily able to pass a [place] out the neural foramen on both the right and left sides of the L4-5 and L5-S1 levels.  Of particular note, was the left side where it readily apparent that there was excessive and severe lateral recess stenosis secondary to the L4-5 facet hypertrophy.  This was removed in its entirety.  There was also noted to be a significant bone spur at the L5-S1 facet joint, which was causing compression of the exiting L5 nerve at the L5-S1 level and a thorough foraminotomy was performed, which did confirm adequate decompression of the exiting L5 nerve.  I was very happy with the final decompression.  The epidural space was packed with Gelfoam soaked with thrombin.  At this point, the wound was copiously irrigated using approximately 1 L of normal saline. I then turned my attention towards the fusion aspect of the procedure. I did use a 4-mm high-speed bur to decorticate the transverse processes of L4 and L5 bilaterally as well as the posterior elements in addition to the L4-5 facet joint.  I did obtain a 5 mL of the Vitoss BA and this was mixed with 4 mL of bone marrow aspirate that obtained from the patient's left iliac crest using a separate incision.  This was mixed with the autograft.  Autograft was placed in the right posterolateral gutter and bone marrow aspirate/Vitoss/autograft mixture was placed into the left posterolateral gutter.  7 x 45 mm screws were placed bilaterally at L5 and on the left side at L4.  7 x 40 mm screw was placed on the right side at L4.  Each screw was tested using the triggered EMG and each screw tested over 25 milliamps with the exception of the right L5 screw, which was tested at 7 milliamps.  I did obtain multiple fluoroscopic views to visualize the right L5  screw and it was clearly adequately positioned.  A 30-mm rod was placed on the right and a 35-mm rod was placed on the left.  Caps were placed and a final locking procedure was performed bilaterally.  I again obtained AP and lateral fluoroscopic views and I was very happy with the final appearance of the radiographs.  I then went forward on closing the fascia using #1 Vicryl.  The subcutaneous layer was closed using 2-0 Vicryl.  The skin was closed using 3-0 Monocryl.  The iliac crest site was also closed using 3-0 Monocryl.  All instrument counts were correct at the termination of the procedure.  Of note, Skip Mayer was my assistant throughout the procedure and aided in essential retraction and suctioning needed throughout the surgery.     Estill Bamberg, MD     MD/MEDQ  D:  08/27/2011  T:  08/27/2011  Job:  [id number]

## 2011-08-28 MED ORDER — OXYCODONE HCL 10 MG PO TB12
10.0000 mg | ORAL_TABLET | Freq: Two times a day (BID) | ORAL | Status: DC
  Administered 2011-08-28 (×2): 10 mg via ORAL
  Filled 2011-08-28 (×2): qty 1

## 2011-08-28 NOTE — Progress Notes (Signed)
Ortho  Pt comfortable, no leg pain, minimal back pain  AVSS NVI Dressing cdi  POD #1 after 4-1 decompression, 4/5 fusion  D/c pca today Oxycontin, percocet, valium SCDs Up with PT today BID Likely d/c home tomorrow vs. Sunday

## 2011-08-28 NOTE — Progress Notes (Signed)
Occupational Therapy Evaluation Patient Details Name: Joe [name] MRN: [id number] DOB: 09-02-1956 Today's Date: 08/28/2011  Problem List:  Patient Active Problem List  Diagnoses  . Left leg pain    Past Medical History:  Past Medical History  Diagnosis Date  . Arthritis   . Anxiety     over surgery  . Mental disorder    Past Surgical History:  Past Surgical History  Procedure Date  . Shoulder surgery 08/2010    Replacement    OT Assessment/Plan/Recommendation OT Assessment Clinical Impression Statement: Pt will benefit from OT services in the acute care setting to increase I with ADLs and functional transfers in prep for d/c home with mother. OT Recommendation/Assessment: Patient will need skilled OT in the acute care venue OT Problem List: Decreased activity tolerance;Decreased knowledge of use of DME or AE;Decreased knowledge of precautions;Pain OT Therapy Diagnosis : Acute pain OT Plan OT Frequency: Min 2X/week OT Treatment/Interventions: Self-care/ADL training;DME and/or AE instruction;Therapeutic activities;Patient/family education OT Recommendation Follow Up Recommendations: None Equipment Recommended: 3 in 1 bedside comode Individuals Consulted Consulted and Agree with Results and Recommendations: Patient;Family member/caregiver Family Member Consulted: Mother OT Goals Acute Rehab OT Goals OT Goal Formulation: With patient/family Time For Goal Achievement: 7 days ADL Goals Pt Will Perform Grooming: with modified independence;Standing at sink;Other (comment) (while maintaining back safety precautions) ADL Goal: Grooming - Progress: Other (comment) Pt Will Perform Lower Body Bathing: with modified independence;Sit to stand from bed;Other (comment) (while maintaining back safety precautions) ADL Goal: Lower Body Bathing - Progress: Other (comment) Pt Will Perform Lower Body Dressing: with modified independence;Sit to stand from chair;Other (comment) (while  maintaining back safety precautions) ADL Goal: Lower Body Dressing - Progress: Other (comment) Pt Will Transfer to Toilet: with modified independence;Stand pivot transfer;3-in-1;with DME;Maintaining back safety precautions ADL Goal: Toilet Transfer - Progress: Other (comment)  OT Evaluation Precautions/Restrictions  Precautions Precautions: Back Precaution Booklet Issued: Yes (comment) Required Braces or Orthoses: No Prior Functioning Home Living Lives With: Family Receives Help From: Family Type of Home: House Home Layout: Two level Alternate Level Stairs-Rails: Can reach both Alternate Level Stairs-Number of Steps: 15 Home Access: Stairs to enter Entrance Stairs-Rails: None Entrance Stairs-Number of Steps: 2 Bathroom Shower/Tub: Engineer, manufacturing systems: Standard Bathroom Accessibility: Yes How Accessible: Accessible via walker Home Adaptive Equipment: Straight cane Additional Comments: May possibly have RW Prior Function Level of Independence: Independent with basic ADLs;Independent with gait;Independent with transfers Able to Take Stairs?: Yes Driving: Yes Vocation: On disability ADL ADL Eating/Feeding: Simulated;Independent Where Assessed - Eating/Feeding: Chair Grooming: Simulated;Set up Grooming Details (indicate cue type and reason): Setup assist to gather grooming materials. Where Assessed - Grooming: Sitting, chair Upper Body Bathing: Simulated;Set up Upper Body Bathing Details (indicate cue type and reason): Setup assist to gather bathing materials. Where Assessed - Upper Body Bathing: Sitting, chair Lower Body Bathing: Simulated;Minimal assistance Lower Body Bathing Details (indicate cue type and reason): Min assist due to pain Where Assessed - Lower Body Bathing: Sit to stand from chair Upper Body Dressing: Simulated;Independent Where Assessed - Upper Body Dressing: Sitting, chair Lower Body Dressing: Simulated;Minimal assistance Lower Body  Dressing Details (indicate cue type and reason): Min assist due to pain Where Assessed - Lower Body Dressing: Sit to stand from chair Toilet Transfer: Simulated;Minimal assistance Toilet Transfer Method: Ambulating Toileting - Clothing Manipulation: Simulated;Supervision/safety Where Assessed - Toileting Clothing Manipulation: Standing Toileting - Hygiene: Simulated;Supervision/safety Where Assessed - Toileting Hygiene: Standing Tub/Shower Transfer: Not assessed Tub/Shower Transfer Method: Not assessed Equipment Used:  Rolling walker ADL Comments: Pt with decreased I with ADLs due to back pain. Vision/Perception    Cognition Cognition Arousal/Alertness: Awake/alert Overall Cognitive Status: History of cognitive impairments History of Cognitive Impairment: Appears at baseline functioning Orientation Level: Oriented X4 Sensation/Coordination   Extremity Assessment RUE Assessment RUE Assessment: Within Functional Limits LUE Assessment LUE Assessment: Within Functional Limits Mobility  Bed Mobility Bed Mobility: Yes Rolling Left: 4: Min assist Rolling Left Details (indicate cue type and reason): VC for log rolling Left Sidelying to Sit: Not tested (comment) Sitting - Scoot to Edge of Bed: Not tested (comment) Transfers Transfers: Yes Sit to Stand: 5: Supervision Stand to Sit: 5: Supervision Exercises   End of Session OT - End of Session Equipment Utilized During Treatment: Gait belt Activity Tolerance: Patient limited by pain Patient left: in bed;with call bell in reach;with family/visitor present General Behavior During Session: [facility] for tasks performed Cognition: [facility] for tasks performed   Cipriano Mile 08/28/2011, 4:00 PM  08/28/2011 Cipriano Mile OTR/L Pager [phone] Office [phone]

## 2011-08-28 NOTE — Progress Notes (Signed)
Physical Therapy Evaluation Patient Details Name: Joe [name] MRN: [id number] DOB: May 30, 1956 Today's Date: 08/28/2011  Problem List:  Patient Active Problem List  Diagnoses  . Left leg pain    Past Medical History:  Past Medical History  Diagnosis Date  . Arthritis   . Anxiety     over surgery  . Mental disorder    Past Surgical History:  Past Surgical History  Procedure Date  . Shoulder surgery 08/2010    Replacement    PT Assessment/Plan/Recommendation PT Assessment Clinical Impression Statement: Pt presents with a medical diagnosis of L4-5 fusion and L4-S1 decompression along with the following impairments/deficits. Pt will benefit from skilled PT in the acute care setting in order to maximize functional mobility for a safe d/c home. PT Recommendation/Assessment: Patient will need skilled PT in the acute care venue PT Problem List: Decreased balance;Decreased mobility;Decreased safety awareness;Decreased knowledge of precautions;Pain PT Therapy Diagnosis : Difficulty walking PT Plan PT Frequency: Min 5X/week PT Treatment/Interventions: Gait training;Stair training;Patient/family education;Balance training;Therapeutic exercise;Functional mobility training PT Recommendation Follow Up Recommendations: None Equipment Recommended: Rolling walker with 5" wheels PT Goals  Acute Rehab PT Goals PT Goal Formulation: With patient/family Time For Goal Achievement: 7 days Pt will Roll Supine to Left Side: with modified independence PT Goal: Rolling Supine to Left Side - Progress: Progressing toward goal Pt will go Supine/Side to Sit: with modified independence PT Goal: Supine/Side to Sit - Progress: Progressing toward goal Pt will Transfer Sit to Stand/Stand to Sit: with modified independence PT Transfer Goal: Sit to Stand/Stand to Sit - Progress: Progressing toward goal Pt will Transfer Bed to Chair/Chair to Bed: with modified independence PT Transfer Goal: Bed to  Chair/Chair to Bed - Progress: Progressing toward goal Pt will Ambulate: >150 feet;with modified independence;with rolling walker PT Goal: Ambulate - Progress: Progressing toward goal Pt will Go Up / Down Stairs: Flight;with supervision;with rail(s) PT Goal: Up/Down Stairs - Progress: Progressing toward goal  PT Evaluation Precautions/Restrictions  Precautions Precautions: Back Precaution Booklet Issued: Yes (comment) Required Braces or Orthoses: No Prior Functioning  Home Living Lives With: Family Receives Help From: Family Type of Home: House Home Layout: Two level Alternate Level Stairs-Rails: Can reach both Alternate Level Stairs-Number of Steps: 15 Home Access: Stairs to enter Entrance Stairs-Rails: None Entrance Stairs-Number of Steps: 2 Bathroom Shower/Tub: Engineer, manufacturing systems: Standard Bathroom Accessibility: Yes How Accessible: Accessible via walker Home Adaptive Equipment: Straight cane Additional Comments: May possibly have RW Prior Function Level of Independence: Independent with basic ADLs;Independent with gait;Independent with transfers Able to Take Stairs?: Yes Driving: Yes Vocation: On disability Cognition Cognition Arousal/Alertness: Awake/alert Overall Cognitive Status: History of cognitive impairments History of Cognitive Impairment: Appears at baseline functioning Orientation Level: Oriented X4 Sensation/Coordination Sensation Light Touch: Appears Intact Extremity Assessment RLE Assessment RLE Assessment: Within Functional Limits LLE Assessment LLE Assessment: Within Functional Limits Mobility (including Balance) Bed Mobility Bed Mobility: Yes Rolling Left: 4: Min assist Rolling Left Details (indicate cue type and reason): VC for log rolling to maintain back precuations including and hand placement Left Sidelying to Sit: 4: Min assist Left Sidelying to Sit Details (indicate cue type and reason): VC for sequencing and hand  placement Sitting - Scoot to Edge of Bed: 6: Modified independent (Device/Increase time) Transfers Transfers: Yes Sit to Stand: 5: Supervision Sit to Stand Details (indicate cue type and reason): VC for hand placement. Supervision for safety Stand to Sit: 5: Supervision Stand to Sit Details: VC for hand placement for safety Ambulation/Gait  Ambulation/Gait: Yes Ambulation/Gait Assistance: 5: Supervision;4: Min assist (Supervision-Minguard assist) Ambulation/Gait Assistance Details (indicate cue type and reason): VC for gait sequencing and speed secondary to speed Ambulation Distance (Feet): 200 Feet Assistive device: None Gait Pattern: Within Functional Limits Gait velocity: Normal gait speed (slight balance loss with increased gait speed) Stairs: Yes Stairs Assistance: 4: Min assist Stairs Assistance Details (indicate cue type and reason): VC for sequencing. Assist for balance on steps Stair Management Technique: No rails;Forwards Number of Stairs: 3   Balance Balance Assessed: Yes Dynamic Standing Balance Dynamic Standing - Level of Assistance: 4: Min assist (Slight balance loss during gait) Exercise    End of Session PT - End of Session Equipment Utilized During Treatment: Gait belt Activity Tolerance: Patient tolerated treatment well Patient left: in chair;with family/visitor present;with call bell in reach Nurse Communication: Mobility status for transfers;Mobility status for ambulation General Behavior During Session: [facility] for tasks performed Cognition: [facility] for tasks performed  Joe [name] 08/28/2011, 10:47 AM  08/28/2011 Joe [name] DPT PAGER: [phone] OFFICE: [phone]

## 2011-08-29 MED ORDER — PROMETHAZINE HCL 12.5 MG PO TABS: 12.5000 mg | ORAL_TABLET | ORAL | Status: DC | PRN

## 2011-08-29 MED ORDER — OXYCODONE HCL 10 MG PO TB12: 10.0000 mg | ORAL_TABLET | Freq: Two times a day (BID) | ORAL | Status: AC

## 2011-08-29 NOTE — Progress Notes (Signed)
JP accidentally pulled out when getting out of bed by patient. JP output 20ml. Scant drainage at site, reinforced with 4x4 and hyperfix tape. Will continue to monitor pt. Elly Modena

## 2011-08-29 NOTE — Progress Notes (Signed)
Physical Therapy Treatment Patient Details Name: DELEON PASSE MRN: [id number] DOB: 1955/11/26 Today's Date: 08/29/2011  PT Assessment/Plan  PT - Assessment/Plan Comments on Treatment Session: Pt progressing well. Discussed safety with pt and mother for use of RW at home until pt feels as though his balance and strength is normal. Pt was able to complete a full flight of steps with minimal assistance.  PT Plan: Discharge plan remains appropriate PT Frequency: Min 5X/week Follow Up Recommendations: None Equipment Recommended: Rolling walker with 5" wheels PT Goals  Acute Rehab PT Goals PT Goal Formulation: With patient/family Time For Goal Achievement: 7 days PT Goal: Rolling Supine to Left Side - Progress: Progressing toward goal PT Goal: Supine/Side to Sit - Progress: Progressing toward goal PT Transfer Goal: Sit to Stand/Stand to Sit - Progress: Progressing toward goal PT Transfer Goal: Bed to Chair/Chair to Bed - Progress: Progressing toward goal PT Goal: Ambulate - Progress: Progressing toward goal PT Goal: Up/Down Stairs - Progress: Progressing toward goal  PT Treatment Precautions/Restrictions  Precautions Precautions: Back Precaution Booklet Issued: Yes (comment) Required Braces or Orthoses: No Mobility (including Balance) Bed Mobility Bed Mobility: No Transfers Transfers: Yes Sit to Stand: 6: Modified independent (Device/Increase time) Stand to Sit: 6: Modified independent (Device/Increase time) Ambulation/Gait Ambulation/Gait: Yes Ambulation/Gait Assistance: 4: Min assist (Minguard assist-Min assist ) Ambulation/Gait Assistance Details (indicate cue type and reason): Increased assistance needed at times during swaying. Discussed use of RW at home when no supervision for safety due to balance loss and weakness.  Ambulation Distance (Feet): 200 Feet Assistive device: None Gait Pattern:  (Swaying with increased speed) Gait velocity: Normal gait speed Stairs:  Yes Stairs Assistance: 4: Min assist (Minguard assist) Stairs Assistance Details (indicate cue type and reason): VC for stair sequencing. Reciprocal stairs towards end of stairwell Stair Management Technique: One rail Left;Forwards Number of Stairs: 13     Exercise    End of Session PT - End of Session Equipment Utilized During Treatment: Gait belt Activity Tolerance: Patient tolerated treatment well Patient left: in chair;with family/visitor present;with call bell in reach Nurse Communication: Mobility status for transfers;Mobility status for ambulation General Behavior During Session: [facility] for tasks performed Cognition: [facility] for tasks performed  [name] 08/29/2011, 10:28 AM  08/29/2011 [name] DPT PAGER: [phone] OFFICE: [phone]

## 2011-08-29 NOTE — Progress Notes (Signed)
Ortho   Pt comfortable, no leg pain, minimal back pain   AVSS  NVI  Dressing cdi  Pt appears very comfortable, sitting in chair  POD #2 after 4-1 decompression, 4/5 fusion   Oxycontin, percocet, valium  SCDs  d/c home today

## 2011-08-31 MED FILL — Sodium Chloride Irrigation Soln 0.9%: Qty: 3000 | Status: AC

## 2011-08-31 MED FILL — Heparin Sodium (Porcine) Inj 1000 Unit/ML: INTRAMUSCULAR | Qty: 1 | Status: AC

## 2011-08-31 MED FILL — Sodium Chloride IV Soln 0.9%: INTRAVENOUS | Qty: 1000 | Status: AC

## 2011-09-11 NOTE — Discharge Summary (Signed)
NAMESWAIN, ACREE               ACCOUNT NO.:  [id number]  MEDICAL RECORD NO.:  [id number]  LOCATION:  5030                         FACILITY:  [facility]  PHYSICIAN:  Estill Bamberg, MD      DATE OF BIRTH:  20-Jan-1956  DATE OF ADMISSION:  08/27/2011 DATE OF DISCHARGE:  08/29/2011                              DISCHARGE SUMMARY   ADMISSION DIAGNOSES:  L4-S1 spinal stenosis as well as L5-S1 spondylolisthesis.  DISCHARGE DIAGNOSES:  L4-S1 spinal stenosis as well as L5-S1 spondylolisthesis.  ATTENDING PHYSICIAN:  Estill Bamberg, M.D.  ADMISSION HISTORY:  Briefly, Mr. Krukowski is a very pleasant 55 year old male who presented to my office with severe debilitating pain in his left leg.  Upon review of the patient's MRI, he was noted to have severe stenosis at the L4-5 and L5-S1 levels as well as a grade 1 spondylolisthesis.  Given the patient's failure of nonoperative care, the patient was admitted on August 27, 2011, for a lumbar decompression and fusion.  HOSPITAL COURSE:  On August 27, 2011, the patient was brought to surgery and underwent the procedure noted above.  The patient tolerated the procedure well and was transferred to recovery in stable condition. Of note, the patient has very minimal pain postoperatively.  His PCA was discontinued on the morning of postop day #1.  The patient was progressively mobilized throughout his hospital stay.  On the morning of postop day #2, it was felt the patient will be safe for discharge home. The patient was uneventfully discharged home on the morning of postop day#3. The patient was noted to be neurovascularly intact and his dressing was noted to be clean, dry, intact.  BRIEF DISCHARGE INSTRUCTIONS:  The patient will take Percocet for pain and Valium for spasms.  He will adhere to back precautions at all times. He will follow up in my office at 2 weeks.     Estill Bamberg, MD     MD/MEDQ  D:  09/11/2011  T:  09/11/2011  Job:   [id number]

## 2012-09-28 IMAGING — CR DG LUMBAR SPINE 2-3V
2 series · 2 of 2 positions shown · non-contrast
Comparison: Intraoperative films earlier today.

CLINICAL DATA: Low back pain.  Postop.

LUMBAR SPINE - 2-3 VIEW

[view not recorded (1 of 2)]
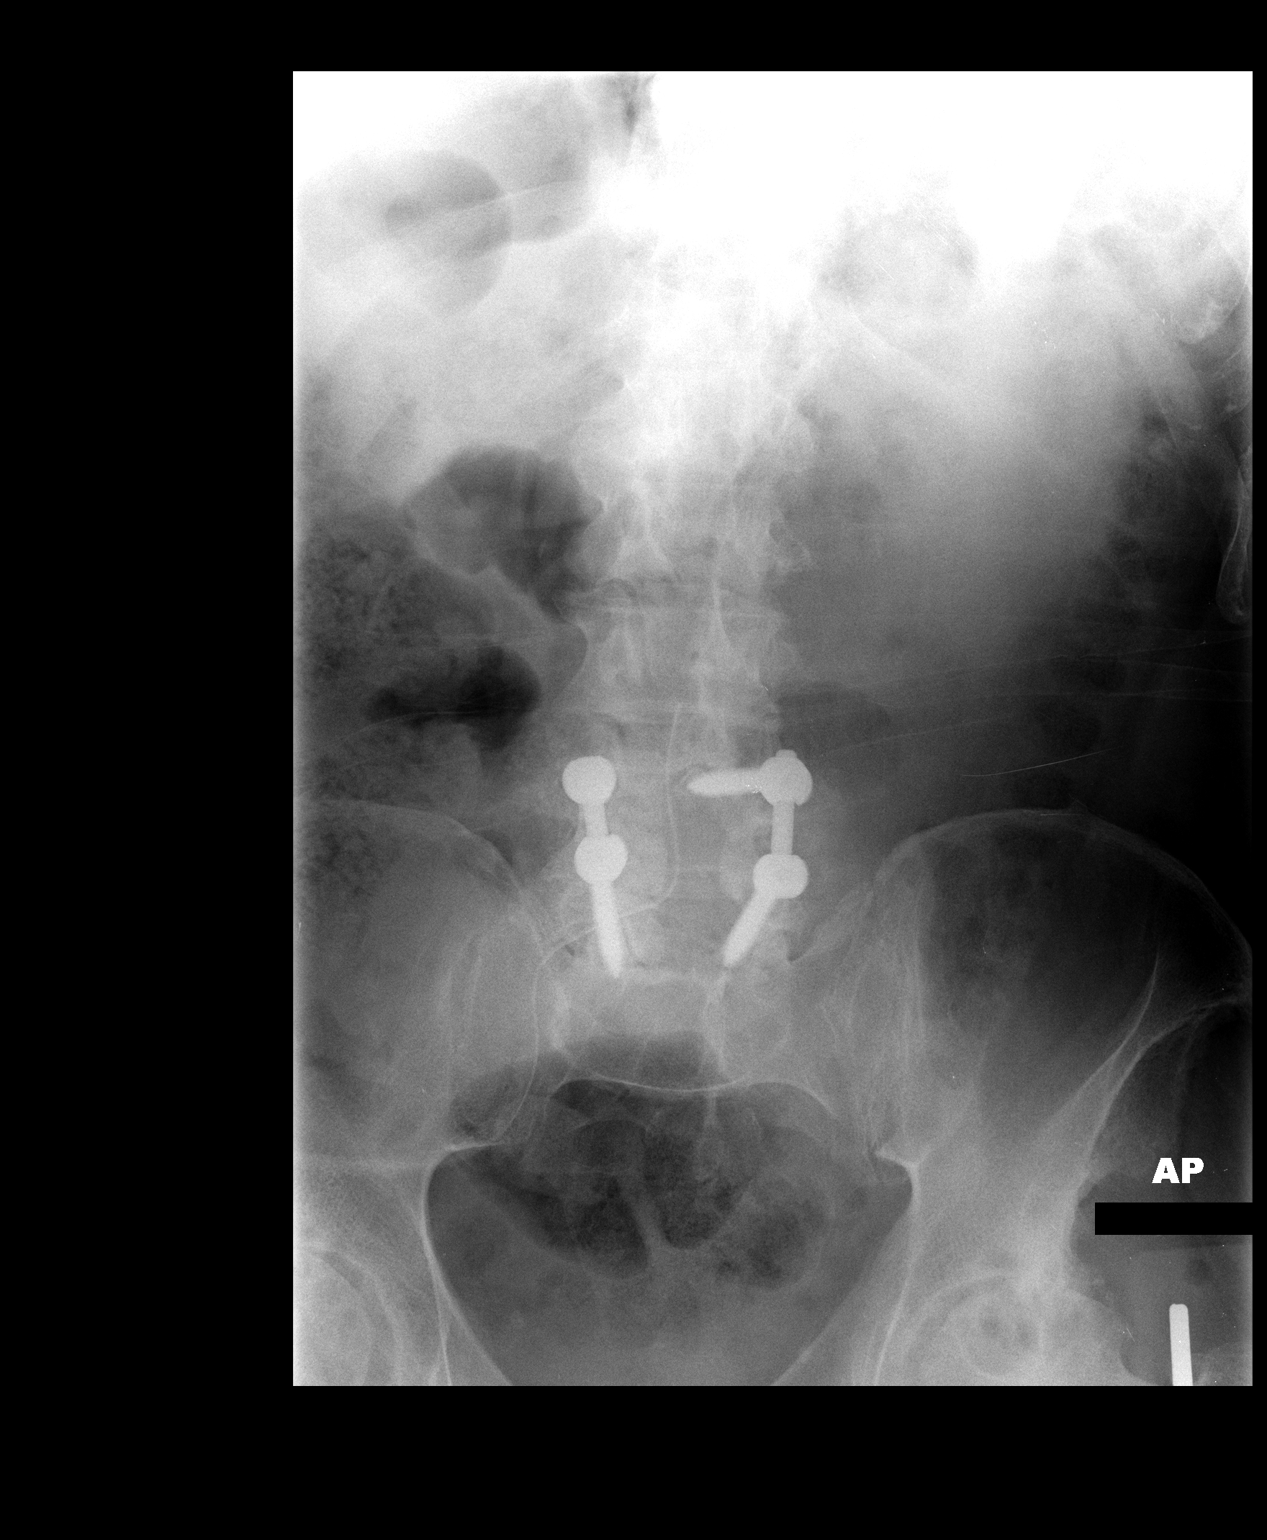

[view not recorded (2 of 2)]
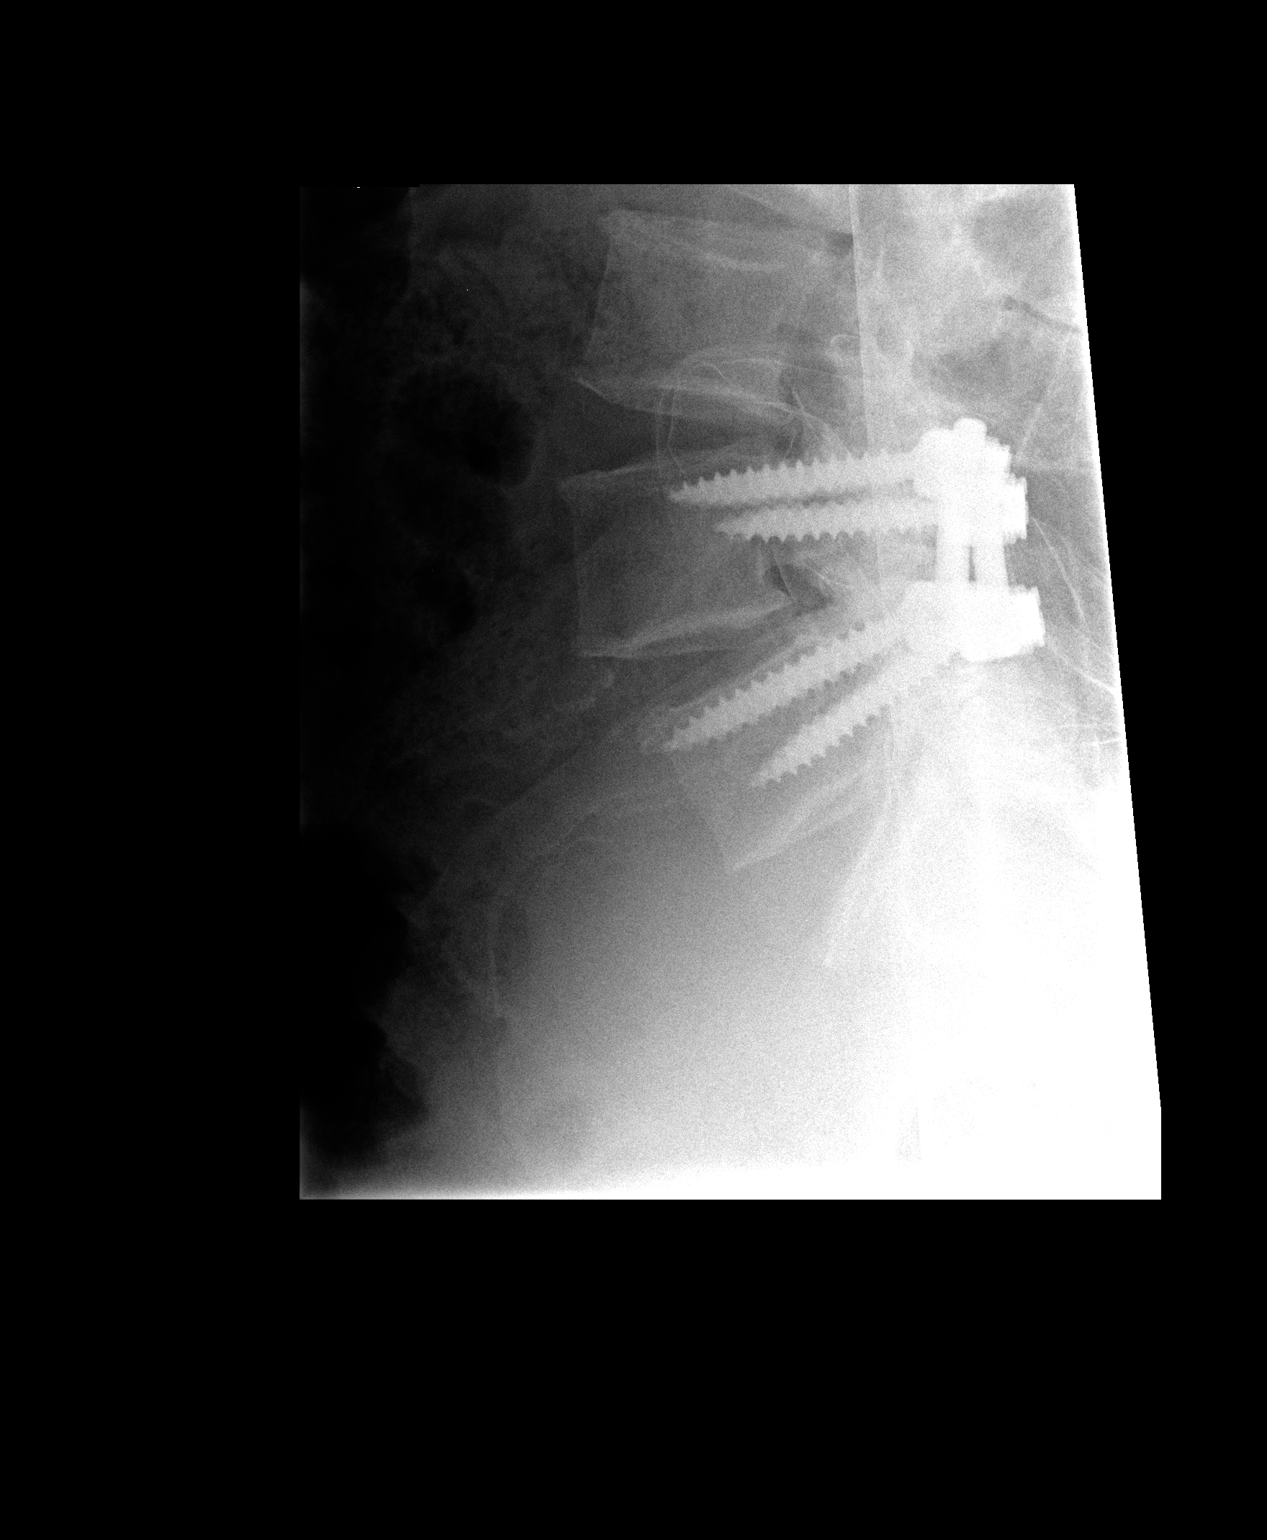

[2 of 2 positions shown; findings below may reference images not displayed]

FINDINGS: The patient is status post L4-L5 posterolateral fusion
with pedicle screw and rod fixation.  Satisfactory position and
alignment.  Surgical drain in the midline.
IMPRESSION: As above.

## 2012-12-11 ENCOUNTER — Emergency Department (HOSPITAL_COMMUNITY): Payer: Medicare Other

## 2012-12-11 ENCOUNTER — Inpatient Hospital Stay (HOSPITAL_COMMUNITY)
Admission: EM | Admit: 2012-12-11 | Discharge: 2012-12-16 | DRG: 641 | Disposition: A | Discharge status: Home / Self Care | Payer: Medicare Other | Attending: Internal Medicine | Admitting: Internal Medicine

## 2012-12-11 ENCOUNTER — Encounter (HOSPITAL_COMMUNITY): Payer: Self-pay | Admitting: Nurse Practitioner

## 2012-12-11 DIAGNOSIS — R339 Retention of urine, unspecified: Secondary | ICD-10-CM | POA: Diagnosis present

## 2012-12-11 DIAGNOSIS — R142 Eructation: Secondary | ICD-10-CM | POA: Diagnosis present

## 2012-12-11 DIAGNOSIS — R141 Gas pain: Secondary | ICD-10-CM | POA: Diagnosis present

## 2012-12-11 DIAGNOSIS — M129 Arthropathy, unspecified: Secondary | ICD-10-CM | POA: Diagnosis present

## 2012-12-11 DIAGNOSIS — M79609 Pain in unspecified limb: Secondary | ICD-10-CM | POA: Diagnosis present

## 2012-12-11 DIAGNOSIS — Z96619 Presence of unspecified artificial shoulder joint: Secondary | ICD-10-CM

## 2012-12-11 DIAGNOSIS — R443 Hallucinations, unspecified: Secondary | ICD-10-CM | POA: Diagnosis present

## 2012-12-11 DIAGNOSIS — R945 Abnormal results of liver function studies: Secondary | ICD-10-CM

## 2012-12-11 DIAGNOSIS — N138 Other obstructive and reflux uropathy: Secondary | ICD-10-CM | POA: Diagnosis present

## 2012-12-11 DIAGNOSIS — F05 Delirium due to known physiological condition: Secondary | ICD-10-CM | POA: Diagnosis present

## 2012-12-11 DIAGNOSIS — R748 Abnormal levels of other serum enzymes: Secondary | ICD-10-CM | POA: Diagnosis present

## 2012-12-11 DIAGNOSIS — F411 Generalized anxiety disorder: Secondary | ICD-10-CM | POA: Diagnosis present

## 2012-12-11 DIAGNOSIS — R140 Abdominal distension (gaseous): Secondary | ICD-10-CM | POA: Diagnosis not present

## 2012-12-11 DIAGNOSIS — E876 Hypokalemia: Secondary | ICD-10-CM | POA: Diagnosis present

## 2012-12-11 DIAGNOSIS — F79 Unspecified intellectual disabilities: Secondary | ICD-10-CM | POA: Diagnosis present

## 2012-12-11 DIAGNOSIS — E860 Dehydration: Principal | ICD-10-CM | POA: Diagnosis present

## 2012-12-11 DIAGNOSIS — N401 Enlarged prostate with lower urinary tract symptoms: Secondary | ICD-10-CM | POA: Diagnosis present

## 2012-12-11 DIAGNOSIS — M79605 Pain in left leg: Secondary | ICD-10-CM | POA: Diagnosis present

## 2012-12-11 DIAGNOSIS — D72829 Elevated white blood cell count, unspecified: Secondary | ICD-10-CM | POA: Diagnosis present

## 2012-12-11 DIAGNOSIS — R7989 Other specified abnormal findings of blood chemistry: Secondary | ICD-10-CM | POA: Diagnosis present

## 2012-12-11 DIAGNOSIS — R4182 Altered mental status, unspecified: Secondary | ICD-10-CM | POA: Diagnosis present

## 2012-12-11 LAB — URINALYSIS, ROUTINE W REFLEX MICROSCOPIC
Glucose, UA: NEGATIVE mg/dL
Hgb urine dipstick: NEGATIVE
Leukocytes, UA: NEGATIVE
Specific Gravity, Urine: 1.021 (ref 1.005–1.030)
Urobilinogen, UA: 1 mg/dL (ref 0.0–1.0)

## 2012-12-11 LAB — CBC
HCT: 42.6 % (ref 39.0–52.0)
HCT: 38.3 % — ABNORMAL LOW (ref 39.0–52.0)
Hemoglobin: 14.1 g/dL (ref 13.0–17.0)
MCH: 35.5 pg — ABNORMAL HIGH (ref 26.0–34.0)
MCHC: 36.8 g/dL — ABNORMAL HIGH (ref 30.0–36.0)
MCV: 96.5 fL (ref 78.0–100.0)
RDW: 13.5 % (ref 11.5–15.5)
WBC: 12.8 10*3/uL — ABNORMAL HIGH (ref 4.0–10.5)

## 2012-12-11 LAB — COMPREHENSIVE METABOLIC PANEL
AST: 22 U/L (ref 0–37)
Albumin: 4.6 g/dL (ref 3.5–5.2)
Calcium: 10.1 mg/dL (ref 8.4–10.5)
Creatinine, Ser: 0.62 mg/dL (ref 0.50–1.35)
GFR calc non Af Amer: >90 mL/min (ref >90)

## 2012-12-11 LAB — GLUCOSE, CAPILLARY: Glucose-Capillary: 118 mg/dL — ABNORMAL HIGH (ref 70–99)

## 2012-12-11 MED ORDER — ACETAMINOPHEN 325 MG PO TABS
650.0000 mg | ORAL_TABLET | Freq: Four times a day (QID) | ORAL | Status: DC | PRN
  Administered 2012-12-13: 650 mg via ORAL
  Filled 2012-12-11: qty 2

## 2012-12-11 MED ORDER — DIAZEPAM 5 MG PO TABS
2.5000 mg | ORAL_TABLET | Freq: Four times a day (QID) | ORAL | Status: DC | PRN
  Administered 2012-12-12: 2.5 mg via ORAL
  Filled 2012-12-11: qty 1

## 2012-12-11 MED ORDER — ACETAMINOPHEN 650 MG RE SUPP: 650.0000 mg | Freq: Four times a day (QID) | RECTAL | Status: DC | PRN

## 2012-12-11 MED ORDER — DOCUSATE SODIUM 100 MG PO CAPS
100.0000 mg | ORAL_CAPSULE | Freq: Two times a day (BID) | ORAL | Status: DC
  Administered 2012-12-11 – 2012-12-16 (×8): 100 mg via ORAL
  Filled 2012-12-11 (×8): qty 1

## 2012-12-11 MED ORDER — ONDANSETRON HCL 4 MG PO TABS: 4.0000 mg | ORAL_TABLET | Freq: Four times a day (QID) | ORAL | Status: DC | PRN

## 2012-12-11 MED ORDER — SODIUM CHLORIDE 0.9 % IV SOLN
INTRAVENOUS | Status: DC
  Administered 2012-12-11 – 2012-12-16 (×10): via INTRAVENOUS

## 2012-12-11 MED ORDER — SODIUM CHLORIDE 0.9 % IV SOLN: Freq: Once | INTRAVENOUS | Status: DC

## 2012-12-11 MED ORDER — ONDANSETRON HCL 4 MG/2ML IJ SOLN: 4.0000 mg | Freq: Four times a day (QID) | INTRAMUSCULAR | Status: DC | PRN

## 2012-12-11 MED ORDER — ENOXAPARIN SODIUM 40 MG/0.4ML ~~LOC~~ SOLN
40.0000 mg | SUBCUTANEOUS | Status: DC
  Administered 2012-12-11 – 2012-12-15 (×5): 40 mg via SUBCUTANEOUS
  Filled 2012-12-11 (×6): qty 0.4

## 2012-12-11 NOTE — H&P (Signed)
[facility]  History and Physical Scott L. Link Snuffer, MD Pager [phone] (if 7P to 7A, page night hospitalist on [email or web address] [name] NFA:[id number] DOB: April 13, 1956 DOA: 12/11/2012   PCP: Melford Aase, MD   Chief Complaint: altered sensorium   HPI:  The patient has a history of mental retardation, though he is independent with functional.  He complains of urinary urge, without dysuria, hematuria, and may have some urinary retention. He does note decreased interactivity over the past 2 days, denies specific cognitive changes.   The patient's companion states over the past 2 days the patient has been disoriented, with visual hallucinations, delusional behavior. Symptoms began yesterday without clear precipitant. Since onset symptoms not improved with Valium, or other interventions.  In ED, CT head and CXR unremarkable. WBC mildly elevated. VSS. BUN:Cr is elevated       Review of Systems:  As mentioned above   Past Medical History  Diagnosis Date  . Arthritis   . Anxiety     over surgery  . Mental disorder     Past Surgical History  Procedure Laterality Date  . Shoulder surgery  08/2010    Replacement    Social History:  reports that he has never smoked. He does not have any smokeless tobacco history on file. He reports that he does not drink alcohol. His drug history is not on file.  No Known Allergies  History reviewed. No pertinent family history.   Prior to Admission medications   Medication Sig Start Date End Date Taking? Authorizing Provider  diazepam (VALIUM) 5 MG tablet Take 2.5 mg by mouth every 6 (six) hours as needed for anxiety.   Yes Historical Provider, MD  Naproxen Sodium (ALEVE PO) Take 2 tablets by mouth daily as needed.   Yes Historical Provider, MD   Physical Exam: Filed Vitals:   12/11/12 1031  BP: 130/83  Pulse: 87  Temp: 98.6 F (37 C)  TempSrc: Oral  Resp: 18  SpO2: 99%     General:  Well nourished WM in NAD    HEENT: EOMI, PERRL, moist membranes   Neck: trachea midline, no JVD   Cardiovascular: RRR no MRG   Respiratory: no wheezes or rales. CTAB  Abdomen: soft, minimally tender in suprapubic region   Skin: no tenting   Musculoskeletal: 5/5 strength in UE and LE   Psychiatric: no hallucinations at this time but not oriented to place or situation   Neurologic: AAOx1. Normal reflexes   Wt Readings from Last 3 Encounters:  08/27/11 75.4 kg (166 lb 3.6 oz)  08/27/11 75.4 kg (166 lb 3.6 oz)  08/19/11 75.4 kg (166 lb 3.6 oz)    Labs on Admission:  Basic Metabolic Panel:  Recent Labs Lab 12/11/12 1035  NA 138  K 4.0  CL 102  CO2 21  GLUCOSE 120*  BUN 21  CREATININE 0.62  CALCIUM 10.1    Liver Function Tests:  Recent Labs Lab 12/11/12 1035  AST 22  ALT 24  ALKPHOS 118*  BILITOT 1.3*  PROT 7.8  ALBUMIN 4.6   No results found for this basename: LIPASE, AMYLASE,  in the last 168 hours No results found for this basename: AMMONIA,  in the last 168 hours  CBC:  Recent Labs Lab 12/11/12 1157  WBC 12.8*  HGB 15.1  HCT 42.6  MCV 97.3  PLT 261    Cardiac Enzymes: No results found for this basename: CKTOTAL, CKMB, CKMBINDEX, TROPONINI,  in the last 168 hours  Troponin (  Point of Care Test) No results found for this basename: TROPIPOC,  in the last 72 hours  BNP (last 3 results) No results found for this basename: PROBNP,  in the last 8760 hours  CBG:  Recent Labs Lab 12/11/12 1057  GLUCAP 118*     Radiological Exams on Admission: Dg Chest 2 View  12/11/2012  *RADIOLOGY REPORT*  Clinical Data: Altered mental status  CHEST - 2 VIEW  Comparison: 08/19/2011  Findings: Lungs are essentially clear.  Bibasilar opacities, likely atelectasis.  No pleural effusion or pneumothorax.  Mild cardiomegaly.  Degenerative changes of the visualized thoracolumbar spine.  Lucency beneath the right hemidiaphragm likely reflects interposition of colon.  IMPRESSION: No evidence  of acute cardiopulmonary disease.  Lucency beneath the right hemidiaphragm likely reflects interposition of colon.  Consider dedicated abdominal radiographs if clinically warranted.   Original Report Authenticated By: Charline Bills, M.D.    Ct Head Wo Contrast  12/11/2012  *RADIOLOGY REPORT*  Clinical Data: Altered mental status  CT HEAD WITHOUT CONTRAST  Technique:  Contiguous axial images were obtained from the base of the skull through the vertex without contrast.  Comparison: None.  Findings: Motion degraded images.  No evidence of parenchymal hemorrhage or extra-axial fluid collection. No mass lesion, mass effect, or midline shift.  No CT evidence of acute infarction.  Cerebral volume is age appropriate.  No ventriculomegaly.  The visualized paranasal sinuses are essentially clear. 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  The mastoid air cells are unopacified.  No evidence of calvarial fracture.  IMPRESSION: Motion degraded images.  No evidence of acute intracranial abnormality.   Original Report Authenticated By: Charline Bills, M.D.    Dg Finger Ring Right  12/11/2012  *RADIOLOGY REPORT*  Clinical Data: Ring finger pain.  RIGHT RING FINGER 2+V  Comparison: None.  Findings: Degenerative changes at the right ring DIP joint. No acute bony abnormality.  Specifically, no fracture, subluxation, or dislocation.  Soft tissues are intact.  IMPRESSION: No acute bony abnormality.   Original Report Authenticated By: Charlett Nose, M.D.        Assessment/Plan Leukocytosis   - likely hemoconcentration   - U/A clean  -CXR unremarkable  - vitals stable and normal   - repeat CBC in AM and follow   - no abx at this time    Mental status change   - could be from dehydration, combined w/ some urinary retention. But also concern for psychiatric causes  - will hydrate at 100cc/hr NS  - bladder scan in a few hours and see if needs foley. - want to avoid foley if possible b/c  of altered sensorium and potential to worsen delirium w/ restraint  - given hallucinations, ED placed consult to psych at my request  - continue to follow     Time spent: 45 minutes  Alysia Penna, MD  Internal Medicine Pager [phone] If 7PM-7AM, please contact night-coverage at [email or web address], password [facility] 12/11/2012, 2:59 PM

## 2012-12-11 NOTE — ED Notes (Signed)
Pt mother states since yesterday he has been having chills, confusion, agitation, hallucinations, change in mental status. C/o pain in R 4th finger where he jammed it on a door this am. Mother gave pt valium with no change in symptoms. Mother states he has been urinating more. Denies any thoughts of harming self or others. Mother states all night pt thought the doorbell was ringing, patio was on fire, snakes were in house.

## 2012-12-11 NOTE — ED Provider Notes (Signed)
History     CSN: [id number]  Arrival date & time 12/11/12  1007   First MD Initiated Contact with Patient 12/11/12 1108      Chief Complaint  Patient presents with  . Altered Mental Status    HPI  The patient presents with family members and College right the history of present illness.  The patient has a history of mental retardation, though he is independent with functional.  He states that he currently has pain in his finger, left, index following minor trauma, but has no other significant pain.  He complains of urinary urge, without dysuria, hematuria.  He does note decreased interactivity over the past 2 days, denies specific cognitive changes. The patient's companion states over the past 2 days the patient has been disoriented, with visual hallucinations, delusional behavior.  Symptoms began yesterday without clear precipitant.  Since onset symptoms not improved with Valium, or other interventions.  Past Medical History  Diagnosis Date  . Arthritis   . Anxiety     over surgery  . Mental disorder     Past Surgical History  Procedure Laterality Date  . Shoulder surgery  08/2010    Replacement    History reviewed. No pertinent family history.  History  Substance Use Topics  . Smoking status: Never Smoker   . Smokeless tobacco: Not on file  . Alcohol Use: No      Review of Systems  Unable to perform ROS: Psychiatric disorder    Allergies  Review of patient's allergies indicates no known allergies.  Home Medications   Current Outpatient Rx  Name  Route  Sig  Dispense  Refill  . diazepam (VALIUM) 5 MG tablet   Oral   Take 2.5 mg by mouth every 6 (six) hours as needed for anxiety.         . Naproxen Sodium (ALEVE PO)   Oral   Take 2 tablets by mouth daily as needed.           BP 130/83  Pulse 87  Temp(Src) 98.6 F (37 C) (Oral)  Resp 18  SpO2 99%  Physical Exam  Nursing note and vitals reviewed. Constitutional: He is oriented to person,  place, and time. He appears well-developed. No distress.  HENT:  Head: Normocephalic and atraumatic.  Eyes: Conjunctivae and EOM are normal.  Cardiovascular: Normal rate and regular rhythm.   Pulmonary/Chest: Effort normal. No stridor. No respiratory distress.  Abdominal: He exhibits no distension.  Musculoskeletal: He exhibits no edema.       Arms: Neurological: He is alert and oriented to person, place, and time.  Skin: Skin is warm and dry.  Psychiatric: His mood appears anxious. He is slowed. Thought content is delusional. Cognition and memory are impaired. He expresses no homicidal and no suicidal ideation. He expresses no suicidal plans and no homicidal plans.    ED Course  Procedures (including critical care time)  Labs Reviewed  COMPREHENSIVE METABOLIC PANEL - Abnormal; Notable for the following:    Glucose, Bld 120 (*)    Alkaline Phosphatase 118 (*)    Total Bilirubin 1.3 (*)    All other components within normal limits  URINALYSIS, ROUTINE W REFLEX MICROSCOPIC - Abnormal; Notable for the following:    Color, Urine AMBER (*)    Bilirubin Urine SMALL (*)    Ketones, ur 40 (*)    All other components within normal limits  GLUCOSE, CAPILLARY - Abnormal; Notable for the following:    Glucose-Capillary 118 (*)  All other components within normal limits  CBC - Abnormal; Notable for the following:    WBC 12.8 (*)    MCH 34.5 (*)    All other components within normal limits  URINALYSIS, MICROSCOPIC ONLY   Ct Head Wo Contrast  12/11/2012  *RADIOLOGY REPORT*  Clinical Data: Altered mental status  CT HEAD WITHOUT CONTRAST  Technique:  Contiguous axial images were obtained from the base of the skull through the vertex without contrast.  Comparison: None.  Findings: Motion degraded images.  No evidence of parenchymal hemorrhage or extra-axial fluid collection. No mass lesion, mass effect, or midline shift.  No CT evidence of acute infarction.  Cerebral volume is age appropriate.   No ventriculomegaly.  The visualized paranasal sinuses are essentially clear. 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  The mastoid air cells are unopacified.  No evidence of calvarial fracture.  IMPRESSION: Motion degraded images.  No evidence of acute intracranial abnormality.   Original Report Authenticated By: Charline Bills, M.D.    Dg Finger Ring Right  12/11/2012  *RADIOLOGY REPORT*  Clinical Data: Ring finger pain.  RIGHT RING FINGER 2+V  Comparison: None.  Findings: Degenerative changes at the right ring DIP joint. No acute bony abnormality.  Specifically, no fracture, subluxation, or dislocation.  Soft tissues are intact.  IMPRESSION: No acute bony abnormality.   Original Report Authenticated By: Charlett Nose, M.D.      No diagnosis found.  Soon after the patient's initial evaluation with his complaints of urinary urge, with no production, he had Foley catheter placed with production of significant amounts of urine.  Initial labs demonstrate ketonuria, but are otherwise largely unremarkable  MDM  This patient presents with altered mental status, difficulty initiating urinary stream.  The patient also has a musculoskeletal concern, but x-ray does not demonstrate fracture, and the patient is neurovascularly intact in the distal extremity.  Given the patient's mental retardation, there is some limitation of the history of present illness.  However, given the new concern for delirium, possible urinary retention he was admitted for further evaluation and management.  Gerhard Munch, MD 12/11/12 Serena Croissant

## 2012-12-12 DIAGNOSIS — R7989 Other specified abnormal findings of blood chemistry: Secondary | ICD-10-CM | POA: Diagnosis present

## 2012-12-12 LAB — COMPREHENSIVE METABOLIC PANEL
Alkaline Phosphatase: 95 U/L (ref 39–117)
BUN: 12 mg/dL (ref 6–23)
CO2: 23 mEq/L (ref 19–32)
Chloride: 107 mEq/L (ref 96–112)
GFR calc Af Amer: >90 mL/min (ref >90)
Glucose, Bld: 106 mg/dL — ABNORMAL HIGH (ref 70–99)
Potassium: 3.4 mEq/L — ABNORMAL LOW (ref 3.5–5.1)
Total Bilirubin: 1.1 mg/dL (ref 0.3–1.2)

## 2012-12-12 LAB — TSH: TSH: 1.208 u[IU]/mL (ref 0.350–4.500)

## 2012-12-12 MED ORDER — HALOPERIDOL LACTATE 5 MG/ML IJ SOLN
5.0000 mg | Freq: Four times a day (QID) | INTRAMUSCULAR | Status: DC | PRN
Start: 2012-12-12 — End: 2012-12-16
  Administered 2012-12-13: 5 mg via INTRAVENOUS
  Filled 2012-12-12: qty 1

## 2012-12-12 MED ORDER — RISPERIDONE 0.25 MG PO TABS
0.2500 mg | ORAL_TABLET | Freq: Two times a day (BID) | ORAL | Status: DC | PRN
  Administered 2012-12-12 – 2012-12-16 (×4): 0.25 mg via ORAL
  Filled 2012-12-12 (×4): qty 1

## 2012-12-12 MED ORDER — LEVOFLOXACIN 500 MG PO TABS
500.0000 mg | ORAL_TABLET | Freq: Every day | ORAL | Status: DC
  Administered 2012-12-12 – 2012-12-16 (×5): 500 mg via ORAL
  Filled 2012-12-12 (×5): qty 1

## 2012-12-12 MED ORDER — TAMSULOSIN HCL 0.4 MG PO CAPS
0.4000 mg | ORAL_CAPSULE | Freq: Every day | ORAL | Status: DC
  Administered 2012-12-12 – 2012-12-16 (×5): 0.4 mg via ORAL
  Filled 2012-12-12 (×6): qty 1

## 2012-12-12 NOTE — Progress Notes (Signed)
[facility] PROGRESS NOTE  Joe [name] ZOX:[id number] DOB: [birth year] DOA: 12/11/2012 PCP: Melford Aase, MD  Assessment/Plan: 1. Leukocytosis -mild - likely hemoconcentration,  although  demargination is a possibility as well  - Given his presentation I would suspect he may have an infectious process -CXR unremarkable - not coughing-doubt pneumonia  2. visual hallucinations-probably delirium - could be from dehydration, combined w/ some urinary retention. Description of his mental status changes fits with probable medical delirium.  - check TSH, B12, RPR on 12/12/12   - also concern for psychiatric causes - await opinion from Dr. Ferol Luz   - Risperdal  0.25 mg by mouth twice a day as needed to control symptoms  3. Urinary retention-most likely etiology is  BPH. Alternatively the patient may have a urinary tract infection as well. Send urine culture, start Flomax and Levaquin 12/12/12   4. Mild elevation AP and TB 3/2 probably from mild dehydration - might be worthwhile checking an ammonia level.  Repeat complete metabolic profile on March 3 - WNL     Code Status: full Family Communication: Mother at bedside Disposition Plan: Home   Consultants:  Psychiatry  Procedures:  Foley catheter placement  Antibiotics: Levaquin March 3  HPI/Subjective: asleep now  Objective: Filed Vitals:   12/11/12 2105 12/12/12 0300 12/12/12 0605 12/12/12 1400  BP: 140/87  147/78 159/82  Pulse: 83  75 81  Temp: 98.6 F (37 C)  98.4 F (36.9 C) 97.9 F (36.6 C)  TempSrc: Oral   Oral  Resp: 18  19 19   Height:  5\' 5"  (1.651 m)    Weight:  75.6 kg (166 lb 10.7 oz)    SpO2: 96%  97% 99%    Intake/Output Summary (Last 24 hours) at 12/12/12 1542 Last data filed at 12/12/12 1400  Gross per 24 hour  Intake   2175 ml  Output   2300 ml  Net   -125 ml   Filed Weights   12/12/12 0300  Weight: 75.6 kg (166 lb 10.7 oz)    Exam:   General:  Sleeping   Cardiovascular:  rrr  Respiratory: ctab   Abdomen: soft, nt   Data Reviewed: Basic Metabolic Panel:  Recent Labs Lab 12/11/12 1035 12/11/12 1635 12/12/12 0725  NA 138  --  142  K 4.0  --  3.4*  CL 102  --  107  CO2 21  --  23  GLUCOSE 120*  --  106*  BUN 21  --  12  CREATININE 0.62 0.58 0.56  CALCIUM 10.1  --  9.0   Liver Function Tests:  Recent Labs Lab 12/11/12 1035 12/12/12 0725  AST 22 15  ALT 24 17  ALKPHOS 118* 95  BILITOT 1.3* 1.1  PROT 7.8 6.3  ALBUMIN 4.6 3.6   No results found for this basename: LIPASE, AMYLASE,  in the last 168 hours No results found for this basename: AMMONIA,  in the last 168 hours CBC:  Recent Labs Lab 12/11/12 1157 12/11/12 1635  WBC 12.8* 10.2  HGB 15.1 14.1  HCT 42.6 38.3*  MCV 97.3 96.5  PLT 261 244   Cardiac Enzymes: No results found for this basename: CKTOTAL, CKMB, CKMBINDEX, TROPONINI,  in the last 168 hours BNP (last 3 results) No results found for this basename: PROBNP,  in the last 8760 hours CBG:  Recent Labs Lab 12/11/12 1057  GLUCAP 118*    No results found for this or any previous visit (from the past 240 hour(s)).  Studies: Dg Chest 2 View  12/11/2012  *RADIOLOGY REPORT*  Clinical Data: Altered mental status  CHEST - 2 VIEW  Comparison: 08/19/2011  Findings: Lungs are essentially clear.  Bibasilar opacities, likely atelectasis.  No pleural effusion or pneumothorax.  Mild cardiomegaly.  Degenerative changes of the visualized thoracolumbar spine.  Lucency beneath the right hemidiaphragm likely reflects interposition of colon.  IMPRESSION: No evidence of acute cardiopulmonary disease.  Lucency beneath the right hemidiaphragm likely reflects interposition of colon.  Consider dedicated abdominal radiographs if clinically warranted.   Original Report Authenticated By: Charline Bills, M.D.    Ct Head Wo Contrast  12/11/2012  *RADIOLOGY REPORT*  Clinical Data: Altered mental status  CT HEAD WITHOUT CONTRAST  Technique:   Contiguous axial images were obtained from the base of the skull through the vertex without contrast.  Comparison: None.  Findings: Motion degraded images.  No evidence of parenchymal hemorrhage or extra-axial fluid collection. No mass lesion, mass effect, or midline shift.  No CT evidence of acute infarction.  Cerebral volume is age appropriate.  No ventriculomegaly.  The visualized paranasal sinuses are essentially clear. 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  The mastoid air cells are unopacified.  No evidence of calvarial fracture.  IMPRESSION: Motion degraded images.  No evidence of acute intracranial abnormality.   Original Report Authenticated By: Charline Bills, M.D.    Dg Finger Ring Right  12/11/2012  *RADIOLOGY REPORT*  Clinical Data: Ring finger pain.  RIGHT RING FINGER 2+V  Comparison: None.  Findings: Degenerative changes at the right ring DIP joint. No acute bony abnormality.  Specifically, no fracture, subluxation, or dislocation.  Soft tissues are intact.  IMPRESSION: No acute bony abnormality.   Original Report Authenticated By: Charlett Nose, M.D.     Scheduled Meds: . docusate sodium  100 mg Oral BID  . enoxaparin (LOVENOX) injection  40 mg Subcutaneous Q24H  . levofloxacin  500 mg Oral Daily  . tamsulosin  0.4 mg Oral Daily   Continuous Infusions: . sodium chloride 100 mL/hr at 12/12/12 1238    Principal Problem:   Mental status change Active Problems:   Left leg pain   Urinary retention   Abnormal LFTs      LAZA,SORIN  [facility] Pager [phone]. If 8PM-8AM, please contact night-coverage at [email or web address], password [facility] 12/12/2012, 3:42 PM  LOS: 1 day
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
## 2012-12-13 ENCOUNTER — Observation Stay (HOSPITAL_COMMUNITY): Payer: Medicare Other

## 2012-12-13 LAB — COMPREHENSIVE METABOLIC PANEL
Albumin: 3.4 g/dL — ABNORMAL LOW (ref 3.5–5.2)
BUN: 8 mg/dL (ref 6–23)
Chloride: 105 mEq/L (ref 96–112)
Creatinine, Ser: 0.65 mg/dL (ref 0.50–1.35)
GFR calc non Af Amer: >90 mL/min (ref >90)
Total Bilirubin: 1 mg/dL (ref 0.3–1.2)

## 2012-12-13 LAB — CBC
Platelets: 209 10*3/uL (ref 150–400)
RBC: 3.81 MIL/uL — ABNORMAL LOW (ref 4.22–5.81)
WBC: 6.5 10*3/uL (ref 4.0–10.5)

## 2012-12-13 MED ORDER — POTASSIUM CHLORIDE CRYS ER 20 MEQ PO TBCR
40.0000 meq | EXTENDED_RELEASE_TABLET | Freq: Once | ORAL | Status: AC
  Administered 2012-12-13: 40 meq via ORAL
  Filled 2012-12-13: qty 2

## 2012-12-13 NOTE — Progress Notes (Signed)
Notified NP on call that pt is anxious and having visual hallucinations. Also that pt already had dose of Risperdal for this 12 hr. Period. Received order for Haldol IV.
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
## 2012-12-13 NOTE — Progress Notes (Signed)
UR COMPLETED  

## 2012-12-13 NOTE — Progress Notes (Addendum)
[facility] PROGRESS NOTE  Joe [name] WGN:[id number] DOB: 11/09/1955 DOA: 12/11/2012 PCP: Melford Aase, MD  Assessment/Plan: Leukocytosis -mild - Likely hemoconcentration, although demargination/reactive leukocytosis is a possibility as well. - CXR and urine analysis not suggestive for infectious etiology.  - On empiric levofloxacin.  Altered mental status/Acute delirium/visual hallucinations - Etiology unclear.  - Could be from dehydration, combined w/ some urinary retention.  - TSH, B12, RPR, and plasma ammonia 12/12/12 unremarkable.  - Concern for psychiatric causes, appreciate input of Dr. Ferol Luz. - Will get MRI of the brain and EEG. - Risperdal  0.25 mg by mouth twice a day as needed to control symptoms  Urinary retention - Most likely etiology is  BPH. Alternatively the patient may have a urinary tract infection as well, urine analysis not suggestive of infectious etiology.  Urine culture not sent yesterday unfortunately. Treat with empiric levofloxacin. - Continue Flomax, 12/12/12. Do a voiding trial tomorrow.  Mild elevation alkaline phosphatase and total bilirubin on 3/2 probably from mild dehydration - Ammonia level normal. - Likely due to dehydration, resolved with IV hydration.s   Generalized weakness Request PT consultation  Hypokalemia Replace as needed.  Abdominal distention Denies any pain. Having bowel movements, will get an abdominal x-ray for further evaluation.  Code Status: Full Family Communication: Mother at bedside Disposition Plan: Home   Consultants:  Psychiatry  Procedures:  Foley catheter placement  Antibiotics: Levofloxacin 12/12/2012 >>   HPI/Subjective: Per mother patient still confused but improved after risperidone yesterday. Complaining of being hot at times and cold other times.  Objective: Filed Vitals:   12/12/12 0605 12/12/12 1400 12/12/12 2129 12/13/12 0623  BP: 147/78 159/82 135/74 134/81  Pulse: 75 81 81  80  Temp: 98.4 F (36.9 C) 97.9 F (36.6 C) 97.7 F (36.5 C) 98.4 F (36.9 C)  TempSrc:  Oral Oral Oral  Resp: 19 19 18 18   Height:      Weight:      SpO2: 97% 99% 99% 99%    Intake/Output Summary (Last 24 hours) at 12/13/12 1255 Last data filed at 12/13/12 1105  Gross per 24 hour  Intake 2566.33 ml  Output   3500 ml  Net -933.67 ml   Filed Weights   12/12/12 0300  Weight: 75.6 kg (166 lb 10.7 oz)    Exam: Physical Exam: General: Awake, Oriented to location, not oriented to self or time, No acute distress. HEENT: EOMI. Neck: Supple CV: S1 and S2 Lungs: Clear to ascultation bilaterally Abdomen: Soft, Nontender, Nondistended, +bowel sounds. Ext: Good pulses. Trace edema.  Data Reviewed: Basic Metabolic Panel:  Recent Labs Lab 12/11/12 1035 12/11/12 1635 12/12/12 0725 12/13/12 0515  NA 138  --  142 141  K 4.0  --  3.4* 3.1*  CL 102  --  107 105  CO2 21  --  23 24  GLUCOSE 120*  --  106* 109*  BUN 21  --  12 8  CREATININE 0.62 0.58 0.56 0.65  CALCIUM 10.1  --  9.0 9.2   Liver Function Tests:  Recent Labs Lab 12/11/12 1035 12/12/12 0725 12/13/12 0515  AST 22 15 15   ALT 24 17 17   ALKPHOS 118* 95 91  BILITOT 1.3* 1.1 1.0  PROT 7.8 6.3 6.2  ALBUMIN 4.6 3.6 3.4*   No results found for this basename: LIPASE, AMYLASE,  in the last 168 hours  Recent Labs Lab 12/12/12 1630  AMMONIA 31   CBC:  Recent Labs Lab 12/11/12 1157 12/11/12 1635 12/13/12  0515  WBC 12.8* 10.2 6.5  HGB 15.1 14.1 13.2  HCT 42.6 38.3* 37.3*  MCV 97.3 96.5 97.9  PLT 261 244 209   Cardiac Enzymes: No results found for this basename: CKTOTAL, CKMB, CKMBINDEX, TROPONINI,  in the last 168 hours BNP (last 3 results) No results found for this basename: PROBNP,  in the last 8760 hours CBG:  Recent Labs Lab 12/11/12 1057  GLUCAP 118*    No results found for this or any previous visit (from the past 240 hour(s)).   Studies: Dg Chest 2 View  12/11/2012  *RADIOLOGY  REPORT*  Clinical Data: Altered mental status  CHEST - 2 VIEW  Comparison: 08/19/2011  Findings: Lungs are essentially clear.  Bibasilar opacities, likely atelectasis.  No pleural effusion or pneumothorax.  Mild cardiomegaly.  Degenerative changes of the visualized thoracolumbar spine.  Lucency beneath the right hemidiaphragm likely reflects interposition of colon.  IMPRESSION: No evidence of acute cardiopulmonary disease.  Lucency beneath the right hemidiaphragm likely reflects interposition of colon.  Consider dedicated abdominal radiographs if clinically warranted.   Original Report Authenticated By: Charline Bills, M.D.     Scheduled Meds: . docusate sodium  100 mg Oral BID  . enoxaparin (LOVENOX) injection  40 mg Subcutaneous Q24H  . levofloxacin  500 mg Oral Daily  . potassium chloride  40 mEq Oral Once  . tamsulosin  0.4 mg Oral Daily   Continuous Infusions: . sodium chloride 100 mL/hr at 12/13/12 1610    Principal Problem:   Mental status change Active Problems:   Left leg pain   Urinary retention   Abnormal LFTs      REDDY,SRIKAR A  [facility] Pager [phone]. If 7PM-7AM, please contact night-coverage at [email or web address], password [facility] 12/13/2012, 12:55 PM  LOS: 2 days

## 2012-12-14 ENCOUNTER — Inpatient Hospital Stay (HOSPITAL_COMMUNITY): Payer: Medicare Other

## 2012-12-14 DIAGNOSIS — R140 Abdominal distension (gaseous): Secondary | ICD-10-CM | POA: Diagnosis not present

## 2012-12-14 LAB — MAGNESIUM: Magnesium: 1.9 mg/dL (ref 1.5–2.5)

## 2012-12-14 LAB — BASIC METABOLIC PANEL
Chloride: 104 mEq/L (ref 96–112)
GFR calc Af Amer: >90 mL/min (ref >90)
Potassium: 3.6 mEq/L (ref 3.5–5.1)
Sodium: 140 mEq/L (ref 135–145)

## 2012-12-14 LAB — HIV ANTIBODY (ROUTINE TESTING W REFLEX): HIV: NONREACTIVE

## 2012-12-14 MED ORDER — HALOPERIDOL LACTATE 5 MG/ML IJ SOLN
5.0000 mg | Freq: Four times a day (QID) | INTRAMUSCULAR | Status: DC | PRN
  Administered 2012-12-15: 5 mg via INTRAVENOUS
  Filled 2012-12-14: qty 1

## 2012-12-14 NOTE — Progress Notes (Signed)
[id number] Patient's mother very concern about patient's abdomen being very distended. Mother stated patient had two bowel movements yesterday and one was diarrhea. Patient's abdomen is very distended and firm with audible bowel sounds in all quadrants. Will continue to monitor.

## 2012-12-14 NOTE — Progress Notes (Signed)
eeg completed at bedside

## 2012-12-14 NOTE — Consult Note (Signed)
Patient Identification:  Joe [name] Date of Evaluation:  12/14/2012 Reason for Consult:  hallucinations  Referring Provider: Dr. Betti Cruz  History of Present Illness:  Joe [name] is a 57 yo man cared for by his mother, [name].  He is mentally retarded and is supported by mother's family by supervised work jobs or respite care.  He suddenly developed symptoms of urgency His mother says he went to the bathroom many times Sat. Night before she realized something might be wrong. He was having visual hallucinations of snakes crawling out of the walls. He was extremely restless Sat, Sun.and Mon [in hosp.] Nights sleeping only about 2 hrs at a time. His aunt and uncle came over Sun and helped him get dressed and took him to the ED. Inquiry about BPH.- Pt has not had a physical exam in past 3 yrs. (Pt. Had injured shoulder and back, not knowing limits of his strength; and has had multiple surgeries - has not gone to PCP) yesterday, he was awake and responded appropriately.  Today, he is undergoing the EEG and cannot be seen.  Mother provides update for his behavior and remarks.    Past Psychiatric History:Cognitive deficits attributed to birth process ? He has a older brother, working and married; cares for pt. Pt has been in special ed with specific attention to speech. He was unable to retain ie store learned information. He does very well in supervised work, Barrister's clerk. He Has SSD, drinks only occasional alcohol and has never made a suicide attempts. He is supported by family members and is fond of aunt and uncle parent surrogates.     Past Medical History:     Past Medical History  Diagnosis Date  . Arthritis   . Anxiety     over surgery  . Mental disorder        Past Surgical History  Procedure Laterality Date  . Shoulder surgery  08/2010    Replacement    Allergies: No Known Allergies  Current Medications:  Prior to Admission medications   Medication Sig Start Date  End Date Taking? Authorizing Provider  diazepam (VALIUM) 5 MG tablet Take 2.5 mg by mouth every 6 (six) hours as needed for anxiety.   Yes Historical Provider, MD  Naproxen Sodium (ALEVE PO) Take 2 tablets by mouth daily as needed.   Yes Historical Provider, MD    Social History:    reports that he has never smoked. He does not have any smokeless tobacco history on file. He reports that he does not drink alcohol. His drug history is not on file.   Family History:    History reviewed. No pertinent family history.  Mental Status Examination/Evaluation: Objective:  Appearance: Casual and fewer complaints  Eye Contact::  NA  Speech:  NA  Volume:  Normal  Mood:    Affect:  NA  Thought Process:  NA  Orientation:  NA  Thought Content:  NA  Suicidal Thoughts:  No  Homicidal Thoughts:  No  Judgement:  NA  Insight:  NA   DIAGNOSIS:   AXIS I  Psychotic Disorder, NOS, delirium secondary to r/o urinary retention [BPH?], dehydration  AXIS II  Deferred  AXIS III See medical notes.  AXIS IV other psychosocial or environmental problems, problems related to social environment and Pt developed symptoms acutely and are beginning to resolve; pt has very supportive family  AXIS V 41-50 serious symptoms   Assessment/Plan:  Dr. Betti Cruz,  Discussed with Psych CSW Pt  is resting quietly in darkened room while EEG is recorded.  Mother steps out of room.  She says her son [pt] is doing so much better today.  There is concern over his distended abdomen.  He has bowel sounds and has had some diarrhea.  He is placed on liquid diet.  KUB displays gas in distended bowel with no apparent obstrction.  Dr. noted some improvement today.    Mother is encouraged.  She reports pt [son] has not reported any visual hallucinations today.  It is planned to remove the catheter tomorrow.  RECOMMENDATION:  1,.  Will continue to follow; visual hallucinations are most likely due to medical symptoms and are resolving. [name] MD 12/14/2012 2:40 PM

## 2012-12-14 NOTE — Progress Notes (Signed)
Portable EEG completed

## 2012-12-14 NOTE — Progress Notes (Signed)
[facility] PROGRESS NOTE  Joe [name] AVW:[id number] DOB: 1956/05/13 DOA: 12/11/2012 PCP: Melford Aase, MD  Assessment/Plan: Leukocytosis Resolved. Likely hemoconcentration, although demargination/reactive leukocytosis is a possibility as well. CXR and urine analysis not suggestive for infectious etiology. On empiric levofloxacin, define 5 day course.  Altered mental status/Acute delirium/visual hallucinations Etiology unclear. Improved. Could be from dehydration, combined w/ some urinary retention.  TSH, B12, RPR, and plasma ammonia 12/12/12 unremarkable. Concern for psychiatric causes, appreciate input of Dr. Ferol Luz. MRI of the brain and EEG. Risperdal  0.25 mg by mouth twice a day as needed to control symptoms. Hallucinations improved from admission.  Urinary retention Most likely etiology is BPH. Alternatively the patient may have a urinary tract infection as well, urine analysis not suggestive of infectious etiology.  Urine culture not sent yesterday unfortunately. Treat with empiric levofloxacin. Continue Flomax, 12/12/12. Do a voiding trial tomorrow.  Mild elevation alkaline phosphatase and total bilirubin on 3/2 probably from mild dehydration Ammonia level normal. Likely due to dehydration, resolved with IV hydration.  Abdominal distention Abdominal x-ray suggestive of ileus. Patient on clear liquid diet. Patient having bowel movements. Abdominal distention improved from yesterday.  Generalized weakness Request PT consultation.  Hypokalemia Resolved with replacement.  Code Status: Full Family Communication: Mother at bedside Disposition Plan: Home   Consultants:  Psychiatry  Procedures:  Foley catheter placement  Antibiotics: Levofloxacin 12/12/2012 >>   HPI/Subjective: No specific concerns except feeling hot and cold at times. Denies any abdominal pain.  Objective: Filed Vitals:   12/13/12 1420 12/13/12 2112 12/14/12 0400 12/14/12 0558  BP: 138/75  131/74  132/74  Pulse: 82 69  94  Temp: 98.4 F (36.9 C) 98.1 F (36.7 C) 98.4 F (36.9 C) 97 F (36.1 C)  TempSrc: Oral Oral    Resp: 18 18  17   Height:      Weight:      SpO2: 97% 96%  100%    Intake/Output Summary (Last 24 hours) at 12/14/12 1211 Last data filed at 12/14/12 0610  Gross per 24 hour  Intake 2906.67 ml  Output   3751 ml  Net -844.33 ml   Filed Weights   12/12/12 0300  Weight: 75.6 kg (166 lb 10.7 oz)    Exam: Physical Exam: General: Awake, No acute distress. HEENT: EOMI. Neck: Supple CV: S1 and S2 Lungs: Clear to ascultation bilaterally Abdomen: Soft, Nontender, Nondistended, +bowel sounds. Ext: Good pulses. Trace edema.  Data Reviewed: Basic Metabolic Panel:  Recent Labs Lab 12/11/12 1035 12/11/12 1635 12/12/12 0725 12/13/12 0515 12/14/12 0540  NA 138  --  142 141 140  K 4.0  --  3.4* 3.1* 3.6  CL 102  --  107 105 104  CO2 21  --  23 24 23   GLUCOSE 120*  --  106* 109* 113*  BUN 21  --  12 8 10   CREATININE 0.62 0.58 0.56 0.65 0.62  CALCIUM 10.1  --  9.0 9.2 9.5  MG  --   --   --   --  1.9   Liver Function Tests:  Recent Labs Lab 12/11/12 1035 12/12/12 0725 12/13/12 0515  AST 22 15 15   ALT 24 17 17   ALKPHOS 118* 95 91  BILITOT 1.3* 1.1 1.0  PROT 7.8 6.3 6.2  ALBUMIN 4.6 3.6 3.4*   No results found for this basename: LIPASE, AMYLASE,  in the last 168 hours  Recent Labs Lab 12/12/12 1630  AMMONIA 31   CBC:  Recent Labs Lab 12/11/12  1157 12/11/12 1635 12/13/12 0515  WBC 12.8* 10.2 6.5  HGB 15.1 14.1 13.2  HCT 42.6 38.3* 37.3*  MCV 97.3 96.5 97.9  PLT 261 244 209   Cardiac Enzymes: No results found for this basename: CKTOTAL, CKMB, CKMBINDEX, TROPONINI,  in the last 168 hours BNP (last 3 results) No results found for this basename: PROBNP,  in the last 8760 hours CBG:  Recent Labs Lab 12/11/12 1057  GLUCAP 118*    No results found for this or any previous visit (from the past 240 hour(s)).    Studies: Dg Abd 2 Views  12/13/2012  *RADIOLOGY REPORT*  Clinical Data: 57 year old male with abdominal pain and distention nausea and vomiting.  ABDOMEN - 2 VIEW  Comparison: Lumbar radiographs 08/27/2011.  Barium enema 03/18/2009.  Findings: Supine and left-side down lateral decubitus views of the abdomen.  No pneumoperitoneum.  Gas throughout the colon which is redundant as seen in 2010.  Gas in some small bowel, but no definitely dilated small bowel loops. Lower lumbar fusion hardware. Interval very pronounced bilateral hip joint space loss with subchondral sclerosis and bulky subchondral cysts.  No definite acute osseous abnormality.  IMPRESSION: 1.  Gas throughout redundant colon, favor ileus.  No pneumoperitoneum or strong evidence of mechanical bowel obstruction.  Follow-up films may be helpful. 2.  Advanced bilateral hip joint osteoarthritis with significant progression since 2010.   Original Report Authenticated By: Erskine Speed, M.D.     Scheduled Meds: . docusate sodium  100 mg Oral BID  . enoxaparin (LOVENOX) injection  40 mg Subcutaneous Q24H  . levofloxacin  500 mg Oral Daily  . tamsulosin  0.4 mg Oral Daily   Continuous Infusions: . sodium chloride 100 mL/hr at 12/14/12 0606    Principal Problem:   Mental status change Active Problems:   Left leg pain   Urinary retention   Abnormal LFTs      REDDY,SRIKAR A  [facility] Pager [phone]. If 7PM-7AM, please contact night-coverage at [email or web address], password [facility] 12/14/2012, 12:11 PM  LOS: 3 days

## 2012-12-14 NOTE — Progress Notes (Signed)
Patient's mother at bedside and states he appears to be better- complaining of being cold instead of hot- He is sitting up in chair and appears more comfortable than yesterday when I met him- Still unclear etiology- mom plans to take him home at dc- CSW will continue to follow for support and assistance as identified.   Reece Levy, MSW, Theresia Majors [phone]
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
## 2012-12-15 ENCOUNTER — Inpatient Hospital Stay (HOSPITAL_COMMUNITY): Payer: Medicare Other

## 2012-12-15 LAB — CBC
Hemoglobin: 14.2 g/dL (ref 13.0–17.0)
RBC: 4.15 MIL/uL — ABNORMAL LOW (ref 4.22–5.81)

## 2012-12-15 LAB — BASIC METABOLIC PANEL
CO2: 23 mEq/L (ref 19–32)
GFR calc non Af Amer: >90 mL/min (ref >90)
Glucose, Bld: 104 mg/dL — ABNORMAL HIGH (ref 70–99)
Potassium: 3.8 mEq/L (ref 3.5–5.1)
Sodium: 140 mEq/L (ref 135–145)

## 2012-12-15 MED ORDER — LURASIDONE HCL 40 MG PO TABS: 20.0000 mg | ORAL_TABLET | Freq: Every day | ORAL | Status: DC

## 2012-12-15 MED ORDER — LURASIDONE HCL 40 MG PO TABS
20.0000 mg | ORAL_TABLET | Freq: Every day | ORAL | Status: DC
  Administered 2012-12-15: 20 mg via ORAL
  Filled 2012-12-15 (×2): qty 1
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
## 2012-12-15 NOTE — Progress Notes (Signed)
[facility] PROGRESS NOTE  Joe [name] KGM:[id number] DOB: [birth year] DOA: 12/11/2012 PCP: Melford Aase, MD  Assessment/Plan: Leukocytosis Resolved. Likely hemoconcentration, although demargination/reactive leukocytosis is [name] possibility as well. CXR and urine analysis not suggestive for infectious etiology. On empiric levofloxacin, define 5 day course.  Altered mental status/Acute delirium/visual hallucinations Etiology unclear. Improved. Could be from dehydration, combined w/ some urinary retention.  TSH, B12, RPR, and plasma ammonia 12/12/12 unremarkable. Concern for psychiatric causes, appreciate input of Dr. Ferol Luz. Discussed today with Dr. Ferol Luz start lurasidone. MRI of the brain and EEG negative. Risperdal  0.25 mg by mouth twice [name] day as needed to control symptoms. Hallucinations improved from admission.  Urinary retention Most likely etiology is BPH.  Continue Flomax.  Foley catheter discontinued, patient voiding appropriately. Continue empiric levofloxacin.   Mild elevation alkaline phosphatase and total bilirubin on 3/2 probably from mild dehydration Ammonia level normal. Likely due to dehydration, resolved with IV hydration.  Abdominal distention Abdominal x-ray suggestive of ileus, patient having daily regular bowel movements. Patient on clear liquid diet, advance diet to full liquid.  Generalized weakness Request PT consultation.  No PT needs.  Hypokalemia Resolved with replacement.  Code Status: Full Family Communication: Mother at bedside Disposition Plan: Home   Consultants:  Psychiatry  Procedures:  Foley catheter discontinued on 12/15/2012  Antibiotics: Levofloxacin 12/12/2012 >>   HPI/Subjective: Per patient's mother had an episode of hallucination last night.  Objective: Filed Vitals:   12/14/12 0558 12/14/12 1357 12/14/12 2124 12/15/12 0517  BP: 132/74 134/83 134/80 138/77  Pulse: 94 85 75 91  Temp: 97 F (36.1 C) 97.6 F (36.4  C) 98.1 F (36.7 C) 98.2 F (36.8 C)  TempSrc:  Oral Oral Oral  Resp: 17 16 18 18   Height:      Weight:      SpO2: 100% 97% 96% 98%    Intake/Output Summary (Last 24 hours) at 12/15/12 1316 Last data filed at 12/15/12 1100  Gross per 24 hour  Intake   1902 ml  Output   2500 ml  Net   -598 ml   Filed Weights   12/12/12 0300  Weight: 75.6 kg (166 lb 10.7 oz)    Exam: Physical Exam: General: Awake, No acute distress. HEENT: EOMI. Neck: Supple CV: S1 and S2 Lungs: Clear to ascultation bilaterally Abdomen: Soft, Nontender, Nondistended, +bowel sounds. Ext: Good pulses. Trace edema.  Data Reviewed: Basic Metabolic Panel:  Recent Labs Lab 12/11/12 1035 12/11/12 1635 12/12/12 0725 12/13/12 0515 12/14/12 0540 12/15/12 0705  NA 138  --  142 141 140 140  K 4.0  --  3.4* 3.1* 3.6 3.8  CL 102  --  107 105 104 106  CO2 21  --  23 24 23 23   GLUCOSE 120*  --  106* 109* 113* 104*  BUN 21  --  12 8 10 8   CREATININE 0.62 0.58 0.56 0.65 0.62 0.59  CALCIUM 10.1  --  9.0 9.2 9.5 9.1  MG  --   --   --   --  1.9  --    Liver Function Tests:  Recent Labs Lab 12/11/12 1035 12/12/12 0725 12/13/12 0515  AST 22 15 15   ALT 24 17 17   ALKPHOS 118* 95 91  BILITOT 1.3* 1.1 1.0  PROT 7.8 6.3 6.2  ALBUMIN 4.6 3.6 3.4*   No results found for this basename: LIPASE, AMYLASE,  in the last 168 hours  Recent Labs Lab 12/12/12 1630  AMMONIA 31  CBC:  Recent Labs Lab 12/11/12 1157 12/11/12 1635 12/13/12 0515 12/15/12 0705  WBC 12.8* 10.2 6.5 10.2  HGB 15.1 14.1 13.2 14.2  HCT 42.6 38.3* 37.3* 39.6  MCV 97.3 96.5 97.9 95.4  PLT 261 244 209 201   Cardiac Enzymes: No results found for this basename: CKTOTAL, CKMB, CKMBINDEX, TROPONINI,  in the last 168 hours BNP (last 3 results) No results found for this basename: PROBNP,  in the last 8760 hours CBG:  Recent Labs Lab 12/11/12 1057  GLUCAP 118*    No results found for this or any previous visit (from the past 240  hour(s)).   Studies: Mr Brain Wo Contrast  12/15/2012  *RADIOLOGY REPORT*  Clinical Data: Altered mental status.  Hallucinations.  MRI HEAD WITHOUT CONTRAST  Technique:  Multiplanar, multiecho pulse sequences of the brain and surrounding structures were obtained according to standard protocol without intravenous contrast.  Comparison: History of mental dilated.  Findings: The patient was not able to complete entire examination (T1 axial and T2 coronal images were not obtained) secondary to anxiety.  No acute infarct.  No intracranial hemorrhage.  No hydrocephalus.  Major intracranial vascular structures are patent with [name] congenitally small right vertebral artery.  Large slightly complex Thornwaldt cyst.  On the sagittal sequence there is apparent slight asymmetry of the sellar region with fullness on the right.  It is possible this is related to partial volume averaging with the right cavernous sinus region.  This is incompletely assessed on axial imaging. No other findings to suggest intracranial mass lesion.  IMPRESSION: The patient was not able to complete entire examination (T1 axial and T2 coronal images were not obtained) secondary to anxiety.  No acute infarct.  No intracranial hemorrhage.  On the sagittal sequence there is apparent slight asymmetry of the sellar region with fullness on the right.  It is possible this is related to partial volume averaging with the right cavernous sinus region.  This is incompletely assessed on axial imaging.   Original Report Authenticated By: Lacy Duverney, M.D.    Dg Abd 2 Views  12/15/2012  *RADIOLOGY REPORT*  Clinical Data: Abdominal pain, possible ileus  ABDOMEN - 2 VIEW  Comparison: Abdomen films of 12/13/2012  Findings: Both large and small bowel gas is present most consistent with diffuse ileus.  No definite bowel obstruction is seen.  No free air is noted on the left lateral decubitus film of the abdomen.  Hardware for fusion of the lower lumbar spine is noted  and marked degenerative changes are present in both hips.  IMPRESSION: No change in apparent ileus pattern.  No definite obstruction or free air.   Original Report Authenticated By: Dwyane Dee, M.D.    Dg Abd 2 Views  12/13/2012  *RADIOLOGY REPORT*  Clinical Data: 57 year old male with abdominal pain and distention nausea and vomiting.  ABDOMEN - 2 VIEW  Comparison: Lumbar radiographs 08/27/2011.  Barium enema 03/18/2009.  Findings: Supine and left-side down lateral decubitus views of the abdomen.  No pneumoperitoneum.  Gas throughout the colon which is redundant as seen in 2010.  Gas in some small bowel, but no definitely dilated small bowel loops. Lower lumbar fusion hardware. Interval very pronounced bilateral hip joint space loss with subchondral sclerosis and bulky subchondral cysts.  No definite acute osseous abnormality.  IMPRESSION: 1.  Gas throughout redundant colon, favor ileus.  No pneumoperitoneum or strong evidence of mechanical bowel obstruction.  Follow-up films may be helpful. 2.  Advanced bilateral hip joint osteoarthritis  with significant progression since 2010.   Original Report Authenticated By: Erskine Speed, M.D.     Scheduled Meds: . docusate sodium  100 mg Oral BID  . enoxaparin (LOVENOX) injection  40 mg Subcutaneous Q24H  . levofloxacin  500 mg Oral Daily  . lurasidone  20 mg Oral QHS  . tamsulosin  0.4 mg Oral Daily   Continuous Infusions: . sodium chloride 100 mL/hr at 12/15/12 0542    Principal Problem:   Mental status change Active Problems:   Left leg pain   Urinary retention   Abnormal LFTs   Abdominal distension      Joe [name],Joe [name]  [facility] Pager [phone]. If 7PM-7AM, please contact night-coverage at [email or web address], password [facility] 12/15/2012, 1:16 PM  LOS: 4 days

## 2012-12-15 NOTE — Procedures (Signed)
INDICATION FOR STUDY:  A 57 year old man with a history of mental retardation presenting with altered mental status with delirium and confusion.  Study is being performed to assess severity of encephalopathy as well as to rule out possible seizure activity.  DESCRIPTION:  This is a routine EEG recording performed during wakefulness.  Predominant background activity consisted of symmetrical 10 Hz alpha rhythm, which attenuated well with eye opening.  Photic stimulation produced a symmetrical occipital driving response. Hyperventilation was not performed.  No epileptiform discharges were recorded.  There were no areas of abnormal slowing.  INTERPRETATION:  This is a normal EEG recording during wakefulness.  No evidence of an epileptic disorder was demonstrated.     Noel Christmas, MD    FA:OZHY D:  12/14/2012 17:51:08  T:  12/15/2012 03:51:22  Job #:  [id number]

## 2012-12-15 NOTE — Progress Notes (Signed)
Notified NP on call that pt was unable to have MRI brain done because he was unable to hold still enough. Requested to be able to pre-medicate pt with haldol before he returns to MRI. Received order.
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
## 2012-12-15 NOTE — Progress Notes (Signed)
Physical Therapy Evaluation Patient Details Name: Joe [name] MRN: [id number] DOB: 05-28-1956 Today's Date: 12/15/2012 Time: 7829-5621 PT Time Calculation (min): 26 min  PT Assessment / Plan / Recommendation Clinical Impression  Pt is a 57 y.o. male who presented to [facility] with AMS and hx of MR.  Patient initially unstead but as session continued patient was [facility].  Spoke with patients mom in regards to activity. Possible that initial balance deficits are related to fear of moving normally with IV lines attached.  Patient steady with activity.  Encouraged to continue ambulation. Will see one more time to address stair negotiation. No other PT needs identified.    PT Assessment  Patient needs continued PT services    Follow Up Recommendations  No PT follow up    Does the patient have the potential to tolerate intense rehabilitation      Barriers to Discharge None      Equipment Recommendations  None recommended by PT    Recommendations for Other Services     Frequency Min 2X/week    Precautions / Restrictions Precautions Precautions: Fall Restrictions Weight Bearing Restrictions: No   Pertinent Vitals/Pain No pain      Mobility  Bed Mobility Bed Mobility: Supine to Sit;Sitting - Scoot to Edge of Bed;Sit to Supine Supine to Sit: 4: Min assist;HOB elevated;With rails Sitting - Scoot to Edge of Bed: 5: Supervision;With rail Sit to Supine: 5: Supervision;HOB flat Transfers Transfers: Sit to Stand;Stand to Sit Sit to Stand: 4: Min guard Stand to Sit: 4: Min guard Ambulation/Gait Ambulation/Gait Assistance: 5: Supervision;4: Min guard Ambulation Distance (Feet): 400 Feet Assistive device: None Ambulation/Gait Assistance Details: Patient initially unsteady and rigid but with VCs able to increase cadence and stability. Gait Pattern: Within Functional Limits Gait velocity: initally decreased but with cues able to increase to [facility] Gait Details: Feel patient's  instability is secondary to fear of moving with IV line attached. Patient able to increase to a near jog when ambulating in hall without any assistance of balance deficits. Stairs: No        PT Diagnosis: Difficulty walking  PT Problem List: Decreased balance PT Treatment Interventions: Gait training;Stair training;Therapeutic activities;Therapeutic exercise   PT Goals Acute Rehab PT Goals PT Goal Formulation: With patient/family Time For Goal Achievement: 12/22/12 Potential to Achieve Goals: Good Pt will go Sit to Stand: Independently PT Goal: Sit to Stand - Progress: Goal set today Pt will go Stand to Sit: Independently PT Goal: Stand to Sit - Progress: Goal set today Pt will Ambulate: >150 feet;Independently PT Goal: Ambulate - Progress: Goal set today Pt will Go Up / Down Stairs: 3-5 stairs PT Goal: Up/Down Stairs - Progress: Goal set today  Visit Information  Last PT Received On: 12/15/12 Assistance Needed: +1    Subjective Data  Subjective: I feel cold Patient Stated Goal: to walk   Prior Functioning  Home Living Lives With: Family (mother) Available Help at Discharge: Family;Available 24 hours/day Type of Home: House Home Access: Stairs to enter [employer] of Steps: 1 Home Layout: Two level Alternate Level Stairs-Number of Steps: 12, can stay on main if has to; however his bedroom is really upstairs Alternate Level Stairs-Rails: Right Bathroom Shower/Tub: Tub/shower unit;[employer]: Standard Home Adaptive Equipment: Shower chair with back Prior Function Level of Independence: Independent Able to Take Stairs?: Yes Driving: No Communication Communication: Expressive difficulties Dominant Hand: Right    Cognition  Cognition Overall Cognitive Status: History of cognitive impairments - at baseline  Arousal/Alertness: Awake/alert Behavior During Session: Flat affect    Extremity/Trunk Assessment Right Upper Extremity Assessment RUE  ROM/Strength/Tone: Within functional levels Left Upper Extremity Assessment LUE ROM/Strength/Tone: Within functional levels   Balance Standardized Balance Assessment Standardized Balance Assessment: Dynamic Gait Index Dynamic Gait Index Level Surface: Normal Change in Gait Speed: Mild Impairment Gait with Horizontal Head Turns: Normal Gait with Vertical Head Turns: Normal Gait and Pivot Turn: Normal Step Over Obstacle: Normal Step Around Obstacles: Mild Impairment  End of Session PT - End of Session Equipment Utilized During Treatment: Gait belt Activity Tolerance: Patient tolerated treatment well Patient left: in bed;with call bell/phone within reach;with family/visitor present Nurse Communication: Mobility status  GP     Fabio Asa 12/15/2012, 3:43 PM Charlotte Crumb, PT DPT  [phone]

## 2012-12-15 NOTE — Progress Notes (Signed)
RN notified by MRI that they will be unable to take pt down to have MRI of brain tonight but that it will probably be done early in the morning. RN notified pt and pt's mother of this as well.

## 2012-12-16 MED ORDER — LURASIDONE HCL 20 MG PO TABS: 20.0000 mg | ORAL_TABLET | Freq: Every day | ORAL | Status: DC

## 2012-12-16 MED ORDER — TAMSULOSIN HCL 0.4 MG PO CAPS: 0.4000 mg | ORAL_CAPSULE | Freq: Every day | ORAL | Status: DC

## 2012-12-16 MED ORDER — RISPERIDONE 0.25 MG PO TABS: 0.2500 mg | ORAL_TABLET | Freq: Two times a day (BID) | ORAL | Status: DC | PRN

## 2012-12-16 NOTE — Progress Notes (Signed)
Pt discharged to home

## 2012-12-16 NOTE — Progress Notes (Signed)
[facility] PROGRESS NOTE  Joe [name] ZOX:[id number] DOB: [birth year] DOA: 12/11/2012 PCP: Melford Aase, MD  Assessment/Plan: Leukocytosis Resolved. Likely hemoconcentration, although demargination/reactive leukocytosis is a possibility as well. CXR and urine analysis not suggestive for infectious etiology. On empiric levofloxacin, define 5 day course.  Altered mental status/Acute delirium/visual hallucinations Etiology unclear. Improved. Could be from dehydration, combined w/ some urinary retention.  TSH, B12, RPR, and plasma ammonia 12/12/12 unremarkable. Concern for psychiatric causes, appreciate input of Dr. Ferol Luz. Continue start lurasidone, risperdal  0.25 mg by mouth twice a day as needed to control symptoms. MRI of the brain and EEG negative. Hallucinations improved from admission.  Urinary retention Most likely etiology is BPH.  Continue Flomax.  Foley catheter discontinued, patient voiding appropriately. Completed empiric levofloxacin.   Mild elevation alkaline phosphatase and total bilirubin on 3/2 probably from mild dehydration Ammonia level normal. Likely due to dehydration, resolved with IV hydration.  Abdominal distention Abdominal x-ray suggestive of ileus, patient having daily regular bowel movements. Patient on clear liquid diet, advance diet to full liquid.  Generalized weakness PT noted, no PT needs.  Hypokalemia Resolved with replacement.  Code Status: Full Family Communication: Mother at bedside Disposition Plan: Home today.   Consultants:  Psychiatry  Procedures:  Foley catheter discontinued on 12/15/2012  Antibiotics: Levofloxacin 12/12/2012 >> 12/16/2012  HPI/Subjective: Had hallucinations last night that lasted only for a short period of time.  Objective: Filed Vitals:   12/15/12 0517 12/15/12 1408 12/15/12 2057 12/16/12 0616  BP: 138/77 126/80 144/80 145/80  Pulse: 91 88 71 64  Temp: 98.2 F (36.8 C) 97.8 F (36.6 C) 97.6 F  (36.4 C) 97.4 F (36.3 C)  TempSrc: Oral Oral Oral Oral  Resp: 18 20 20 20   Height:      Weight:      SpO2: 98% 99% 97% 100%    Intake/Output Summary (Last 24 hours) at 12/16/12 1122 Last data filed at 12/16/12 1000  Gross per 24 hour  Intake 2660.33 ml  Output   2150 ml  Net 510.33 ml   Filed Weights   12/12/12 0300  Weight: 75.6 kg (166 lb 10.7 oz)    Exam: Physical Exam: General: Awake, No acute distress. HEENT: EOMI. Neck: Supple CV: S1 and S2 Lungs: Clear to ascultation bilaterally Abdomen: Soft, Nontender, Nondistended, +bowel sounds. Ext: Good pulses. Trace edema.  Data Reviewed: Basic Metabolic Panel:  Recent Labs Lab 12/11/12 1035 12/11/12 1635 12/12/12 0725 12/13/12 0515 12/14/12 0540 12/15/12 0705  NA 138  --  142 141 140 140  K 4.0  --  3.4* 3.1* 3.6 3.8  CL 102  --  107 105 104 106  CO2 21  --  23 24 23 23   GLUCOSE 120*  --  106* 109* 113* 104*  BUN 21  --  12 8 10 8   CREATININE 0.62 0.58 0.56 0.65 0.62 0.59  CALCIUM 10.1  --  9.0 9.2 9.5 9.1  MG  --   --   --   --  1.9  --    Liver Function Tests:  Recent Labs Lab 12/11/12 1035 12/12/12 0725 12/13/12 0515  AST 22 15 15   ALT 24 17 17   ALKPHOS 118* 95 91  BILITOT 1.3* 1.1 1.0  PROT 7.8 6.3 6.2  ALBUMIN 4.6 3.6 3.4*   No results found for this basename: LIPASE, AMYLASE,  in the last 168 hours  Recent Labs Lab 12/12/12 1630  AMMONIA 31   CBC:  Recent Labs Lab 12/11/12  1157 12/11/12 1635 12/13/12 0515 12/15/12 0705  WBC 12.8* 10.2 6.5 10.2  HGB 15.1 14.1 13.2 14.2  HCT 42.6 38.3* 37.3* 39.6  MCV 97.3 96.5 97.9 95.4  PLT 261 244 209 201   Cardiac Enzymes: No results found for this basename: CKTOTAL, CKMB, CKMBINDEX, TROPONINI,  in the last 168 hours BNP (last 3 results) No results found for this basename: PROBNP,  in the last 8760 hours CBG:  Recent Labs Lab 12/11/12 1057  GLUCAP 118*    No results found for this or any previous visit (from the past 240  hour(s)).   Studies: Mr Brain Wo Contrast  12/15/2012  *RADIOLOGY REPORT*  Clinical Data: Altered mental status.  Hallucinations.  MRI HEAD WITHOUT CONTRAST  Technique:  Multiplanar, multiecho pulse sequences of the brain and surrounding structures were obtained according to standard protocol without intravenous contrast.  Comparison: History of mental dilated.  Findings: The patient was not able to complete entire examination (T1 axial and T2 coronal images were not obtained) secondary to anxiety.  No acute infarct.  No intracranial hemorrhage.  No hydrocephalus.  Major intracranial vascular structures are patent with a congenitally small right vertebral artery.  Large slightly complex Thornwaldt cyst.  On the sagittal sequence there is apparent slight asymmetry of the sellar region with fullness on the right.  It is possible this is related to partial volume averaging with the right cavernous sinus region.  This is incompletely assessed on axial imaging. No other findings to suggest intracranial mass lesion.  IMPRESSION: The patient was not able to complete entire examination (T1 axial and T2 coronal images were not obtained) secondary to anxiety.  No acute infarct.  No intracranial hemorrhage.  On the sagittal sequence there is apparent slight asymmetry of the sellar region with fullness on the right.  It is possible this is related to partial volume averaging with the right cavernous sinus region.  This is incompletely assessed on axial imaging.   Original Report Authenticated By: Lacy Duverney, M.D.    Dg Abd 2 Views  12/15/2012  *RADIOLOGY REPORT*  Clinical Data: Abdominal pain, possible ileus  ABDOMEN - 2 VIEW  Comparison: Abdomen films of 12/13/2012  Findings: Both large and small bowel gas is present most consistent with diffuse ileus.  No definite bowel obstruction is seen.  No free air is noted on the left lateral decubitus film of the abdomen.  Hardware for fusion of the lower lumbar spine is noted  and marked degenerative changes are present in both hips.  IMPRESSION: No change in apparent ileus pattern.  No definite obstruction or free air.   Original Report Authenticated By: Dwyane Dee, M.D.     Scheduled Meds: . docusate sodium  100 mg Oral BID  . enoxaparin (LOVENOX) injection  40 mg Subcutaneous Q24H  . levofloxacin  500 mg Oral Daily  . lurasidone  20 mg Oral Q supper  . tamsulosin  0.4 mg Oral Daily   Continuous Infusions: . sodium chloride 100 mL/hr at 12/16/12 1610    Principal Problem:   Mental status change Active Problems:   Left leg pain   Urinary retention   Abnormal LFTs   Abdominal distension      REDDY,SRIKAR A  [facility] Pager [phone]. If 7PM-7AM, please contact night-coverage at [email or web address], password [facility] 12/16/2012, 11:22 AM  LOS: 5 days

## 2012-12-16 NOTE — Progress Notes (Signed)
Physical Therapy Treatment Patient Details Name: Joe [name] MRN: [id number] DOB: July 06, 1956 Today's Date: 12/16/2012 Time: 4098-1191 PT Time Calculation (min): 26 min  PT Assessment / Plan / Recommendation Comments on Treatment Session  Pt able to perform all aspects of DGI today without difficulty. Patient amble to go up and down steps without difficulty. Feel patient is back at baseline for mobility, no other acute PT concerns. PT will sign off.    Follow Up Recommendations  No PT follow up     Does the patient have the potential to tolerate intense rehabilitation     Barriers to Discharge        Equipment Recommendations  None recommended by PT    Recommendations for Other Services    Frequency     Plan All goals met and education completed, patient dischaged from PT services    Precautions / Restrictions Precautions Precautions: Fall Restrictions Weight Bearing Restrictions: No   Pertinent Vitals/Pain No pain    Mobility  Bed Mobility Bed Mobility: Not assessed Transfers Transfers: Sit to Stand;Stand to Sit Sit to Stand: 7: Independent Stand to Sit: 7: Independent Ambulation/Gait Ambulation/Gait Assistance: 5: Supervision;4: Min guard Ambulation Distance (Feet): 400 Feet Assistive device: None Ambulation/Gait Assistance Details: Supervision to manage IV pole Gait Pattern: Within Functional Limits Gait velocity: initally decreased but with cues able to increase to [facility] Gait Details: Patient appears overall steady with baseline gait Stairs: Yes Stairs Assistance: 5: Supervision Stairs Assistance Details (indicate cue type and reason): No assist required Stair Management Technique: One rail Right;Forwards Number of Stairs: 6 (performed x 2)      PT Goals Acute Rehab PT Goals PT Goal Formulation: With patient/family Time For Goal Achievement: 12/22/12 Potential to Achieve Goals: Good Pt will go Sit to Stand: Independently PT Goal: Sit to Stand -  Progress: Met Pt will go Stand to Sit: Independently PT Goal: Stand to Sit - Progress: Met Pt will Ambulate: >150 feet;Independently PT Goal: Ambulate - Progress: Partly met Pt will Go Up / Down Stairs: 3-5 stairs PT Goal: Up/Down Stairs - Progress: Met  Visit Information  Last PT Received On: 12/16/12 Assistance Needed: +1    Subjective Data  Subjective: I feel better Patient Stated Goal: to go home   Cognition  Cognition Overall Cognitive Status: History of cognitive impairments - at baseline Arousal/Alertness: Awake/alert Behavior During Session: [facility] for tasks performed    Balance  Standardized Balance Assessment Standardized Balance Assessment: Dynamic Gait Index Dynamic Gait Index Level Surface: Normal Change in Gait Speed: Normal Gait with Horizontal Head Turns: Normal Gait with Vertical Head Turns: Normal Gait and Pivot Turn: Normal Step Over Obstacle: Normal Step Around Obstacles: Normal Steps: Normal Total Score: 24  End of Session PT - End of Session Equipment Utilized During Treatment: Gait belt Activity Tolerance: Patient tolerated treatment well Patient left: in bed;with call bell/phone within reach;with family/visitor present Nurse Communication: Mobility status   GP     Fabio Asa 12/16/2012, 12:06 PM Charlotte Crumb, PT DPT  [phone]

## 2012-12-16 NOTE — Progress Notes (Signed)
Physical Therapy Discharge Patient Details Name: JAVEN HINDERLITER MRN: [id number] DOB: 1955-11-12 Today's Date: 12/16/2012 Time: 4098-1191 PT Time Calculation (min): 26 min  Patient discharged from PT services secondary to goals met and no further PT needs identified.  Please see latest therapy progress note for current level of functioning and progress toward goals.    Progress and discharge plan discussed with patient and/or caregiver: Patient/Caregiver agrees with plan  GP     Fabio Asa 12/16/2012, 12:07 PM

## 2012-12-16 NOTE — Discharge Summary (Signed)
Physician Discharge Summary  Joe [name] YNW:[id number] DOB: 1956-06-26 DOA: 12/11/2012  PCP: Melford Aase, MD  Admit date: 12/11/2012 Discharge date: 12/16/2012  Time spent: 25 minutes  Recommendations for Outpatient Follow-up:  Please followup with Melford Aase, MD (PCP) in 1 week. May consider following up with Urology and Psychiatry as outpatient in the near future.  Discharge Diagnoses:  Principal Problem:   Mental status change Active Problems:   Left leg pain   Urinary retention   Abnormal LFTs   Abdominal distension   Discharge Condition: Stable  Diet recommendation: Regular diet.  Filed Weights   12/12/12 0300  Weight: 75.6 kg (166 lb 10.7 oz)    History of present illness:  On admission: "The patient has a history of mental retardation, though he is independent with functional. He complains of urinary urge, without dysuria, hematuria, and may have some urinary retention. He does note decreased interactivity over the past 2 days, denies specific cognitive changes.   The patient's companion states over the past 2 days the patient has been disoriented, with visual hallucinations, delusional behavior. Symptoms began yesterday without clear precipitant. Since onset symptoms not improved with Valium, or other interventions."  Hospital Course:  Leukocytosis Resolved. Likely hemoconcentration, although demargination/reactive leukocytosis is a possibility as well. CXR and urine analysis not suggestive for infectious etiology. On empiric levofloxacin, completed 5 day course.  Altered mental status/Acute delirium/visual hallucinations Etiology unclear. Improved. Could be from dehydration, combined w/ some urinary retention.  TSH, B12, RPR, and plasma ammonia 12/12/12 unremarkable. Concern for psychiatric causes, appreciate input of Dr. Ferol Luz. Continue start lurasidone, risperdal  0.25 mg by mouth twice a day as needed to control symptoms. MRI of the brain and EEG  negative. Hallucinations improved from admission.  Urinary retention Most likely etiology is BPH.  Continue Flomax.  Foley catheter discontinued, patient voiding appropriately. Completed empiric levofloxacin.   Mild elevation alkaline phosphatase and total bilirubin on 3/2 probably from mild dehydration Ammonia level normal. Likely due to dehydration, resolved with IV hydration.  Abdominal distention Abdominal x-ray suggestive of ileus, patient having daily regular bowel movements. Patient on clear liquid diet, advance diet to full liquid.  Generalized weakness PT noted, no PT needs.  Hypokalemia Resolved with replacement.  Consultants:  Psychiatry  Procedures:  Foley catheter discontinued on 12/15/2012  Antibiotics: Levofloxacin 12/12/2012 >> 12/16/2012  Discharge Exam: Filed Vitals:   12/15/12 0517 12/15/12 1408 12/15/12 2057 12/16/12 0616  BP: 138/77 126/80 144/80 145/80  Pulse: 91 88 71 64  Temp: 98.2 F (36.8 C) 97.8 F (36.6 C) 97.6 F (36.4 C) 97.4 F (36.3 C)  TempSrc: Oral Oral Oral Oral  Resp: 18 20 20 20   Height:      Weight:      SpO2: 98% 99% 97% 100%   Discharge Instructions  Discharge Orders   Future Orders Complete By Expires     Diet general  As directed     Discharge instructions  As directed     Comments:      Please followup with VAN [name],[name], MD (PCP) in 1 week. May consider following up with Urology and Psychiatry as outpatient in the near future.    Increase activity slowly  As directed         Medication List    STOP taking these medications       VALIUM 5 MG tablet  Generic drug:  diazepam      TAKE these medications       ALEVE PO  Take 2 tablets by mouth daily as needed.     Lurasidone HCl 20 MG Tabs  Take 1 tablet (20 mg total) by mouth daily with supper.     risperiDONE 0.25 MG tablet  Commonly known as:  RISPERDAL  Take 1 tablet (0.25 mg total) by mouth every 12 (twelve) hours as needed (hallucinations).      tamsulosin 0.4 MG Caps  Commonly known as:  FLOMAX  Take 1 capsule (0.4 mg total) by mouth daily.           Follow-up Information   Follow up with VAN [name],[name], MD. Schedule an appointment as soon as possible for a visit in 1 week.   Contact information:   [facility] [address] [place] [address] [phone]        The results of significant diagnostics from this hospitalization (including imaging, microbiology, ancillary and laboratory) are listed below for reference.    Significant Diagnostic Studies: Dg Chest 2 View  12/11/2012  *RADIOLOGY REPORT*  Clinical Data: Altered mental status  CHEST - 2 VIEW  Comparison: 08/19/2011  Findings: Lungs are essentially clear.  Bibasilar opacities, likely atelectasis.  No pleural effusion or pneumothorax.  Mild cardiomegaly.  Degenerative changes of the visualized thoracolumbar spine.  Lucency beneath the right hemidiaphragm likely reflects interposition of colon.  IMPRESSION: No evidence of acute cardiopulmonary disease.  Lucency beneath the right hemidiaphragm likely reflects interposition of colon.  Consider dedicated abdominal radiographs if clinically warranted.   Original Report Authenticated By: Charline Bills, M.D.    Ct Head Wo Contrast  12/11/2012  *RADIOLOGY REPORT*  Clinical Data: Altered mental status  CT HEAD WITHOUT CONTRAST  Technique:  Contiguous axial images were obtained from the base of the skull through the vertex without contrast.  Comparison: None.  Findings: Motion degraded images.  No evidence of parenchymal hemorrhage or extra-axial fluid collection. No mass lesion, mass effect, or midline shift.  No CT evidence of acute infarction.  Cerebral volume is age appropriate.  No ventriculomegaly.  The visualized paranasal sinuses are essentially clear. 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  The mastoid air cells are unopacified.  No evidence of  calvarial fracture.  IMPRESSION: Motion degraded images.  No evidence of acute intracranial abnormality.   Original Report Authenticated By: Charline Bills, M.D.    Mr Brain Wo Contrast  12/15/2012  *RADIOLOGY REPORT*  Clinical Data: Altered mental status.  Hallucinations.  MRI HEAD WITHOUT CONTRAST  Technique:  Multiplanar, multiecho pulse sequences of the brain and surrounding structures were obtained according to standard protocol without intravenous contrast.  Comparison: History of mental dilated.  Findings: The patient was not able to complete entire examination (T1 axial and T2 coronal images were not obtained) secondary to anxiety.  No acute infarct.  No intracranial hemorrhage.  No hydrocephalus.  Major intracranial vascular structures are patent with a congenitally small right vertebral artery.  Large slightly complex Thornwaldt cyst.  On the sagittal sequence there is apparent slight asymmetry of the sellar region with fullness on the right.  It is possible this is related to partial volume averaging with the right cavernous sinus region.  This is incompletely assessed on axial imaging. No other findings to suggest intracranial mass lesion.  IMPRESSION: The patient was not able to complete entire examination (T1 axial and T2 coronal images were not obtained) secondary to anxiety.  No acute infarct.  No intracranial hemorrhage.  On the sagittal sequence  there is apparent slight asymmetry of the sellar region with fullness on the right.  It is possible this is related to partial volume averaging with the right cavernous sinus region.  This is incompletely assessed on axial imaging.   Original Report Authenticated By: Lacy Duverney, M.D.    Dg Abd 2 Views  12/15/2012  *RADIOLOGY REPORT*  Clinical Data: Abdominal pain, possible ileus  ABDOMEN - 2 VIEW  Comparison: Abdomen films of 12/13/2012  Findings: Both large and small bowel gas is present most consistent with diffuse ileus.  No definite bowel  obstruction is seen.  No free air is noted on the left lateral decubitus film of the abdomen.  Hardware for fusion of the lower lumbar spine is noted and marked degenerative changes are present in both hips.  IMPRESSION: No change in apparent ileus pattern.  No definite obstruction or free air.   Original Report Authenticated By: Dwyane Dee, M.D.    Dg Abd 2 Views  12/13/2012  *RADIOLOGY REPORT*  Clinical Data: 57 year old male with abdominal pain and distention nausea and vomiting.  ABDOMEN - 2 VIEW  Comparison: Lumbar radiographs 08/27/2011.  Barium enema 03/18/2009.  Findings: Supine and left-side down lateral decubitus views of the abdomen.  No pneumoperitoneum.  Gas throughout the colon which is redundant as seen in 2010.  Gas in some small bowel, but no definitely dilated small bowel loops. Lower lumbar fusion hardware. Interval very pronounced bilateral hip joint space loss with subchondral sclerosis and bulky subchondral cysts.  No definite acute osseous abnormality.  IMPRESSION: 1.  Gas throughout redundant colon, favor ileus.  No pneumoperitoneum or strong evidence of mechanical bowel obstruction.  Follow-up films may be helpful. 2.  Advanced bilateral hip joint osteoarthritis with significant progression since 2010.   Original Report Authenticated By: Erskine Speed, M.D.    Dg Finger Ring Right  12/11/2012  *RADIOLOGY REPORT*  Clinical Data: Ring finger pain.  RIGHT RING FINGER 2+V  Comparison: None.  Findings: Degenerative changes at the right ring DIP joint. No acute bony abnormality.  Specifically, no fracture, subluxation, or dislocation.  Soft tissues are intact.  IMPRESSION: No acute bony abnormality.   Original Report Authenticated By: Charlett Nose, M.D.     Microbiology: No results found for this or any previous visit (from the past 240 hour(s)).   Labs: Basic Metabolic Panel:  Recent Labs Lab 12/11/12 1035 12/11/12 1635 12/12/12 0725 12/13/12 0515 12/14/12 0540 12/15/12 0705  NA  138  --  142 141 140 140  K 4.0  --  3.4* 3.1* 3.6 3.8  CL 102  --  107 105 104 106  CO2 21  --  23 24 23 23   GLUCOSE 120*  --  106* 109* 113* 104*  BUN 21  --  12 8 10 8   CREATININE 0.62 0.58 0.56 0.65 0.62 0.59  CALCIUM 10.1  --  9.0 9.2 9.5 9.1  MG  --   --   --   --  1.9  --    Liver Function Tests:  Recent Labs Lab 12/11/12 1035 12/12/12 0725 12/13/12 0515  AST 22 15 15   ALT 24 17 17   ALKPHOS 118* 95 91  BILITOT 1.3* 1.1 1.0  PROT 7.8 6.3 6.2  ALBUMIN 4.6 3.6 3.4*   No results found for this basename: LIPASE, AMYLASE,  in the last 168 hours  Recent Labs Lab 12/12/12 1630  AMMONIA 31   CBC:  Recent Labs Lab 12/11/12 1157 12/11/12 1635 12/13/12 0515  12/15/12 0705  WBC 12.8* 10.2 6.5 10.2  HGB 15.1 14.1 13.2 14.2  HCT 42.6 38.3* 37.3* 39.6  MCV 97.3 96.5 97.9 95.4  PLT 261 244 209 201   Cardiac Enzymes: No results found for this basename: CKTOTAL, CKMB, CKMBINDEX, TROPONINI,  in the last 168 hours BNP: BNP (last 3 results) No results found for this basename: PROBNP,  in the last 8760 hours CBG:  Recent Labs Lab 12/11/12 1057  GLUCAP 118*       Signed:  REDDY,SRIKAR A  [facility] 12/16/2012, 11:29 AM

## 2013-03-24 ENCOUNTER — Ambulatory Visit (HOSPITAL_COMMUNITY): Payer: Medicare Other | Admitting: Anesthesiology

## 2013-03-24 ENCOUNTER — Encounter (HOSPITAL_COMMUNITY)
Admission: AD | Disposition: A | Discharge status: Home / Self Care | Payer: Self-pay | Source: Ambulatory Visit | Attending: Urology

## 2013-03-24 ENCOUNTER — Encounter (HOSPITAL_COMMUNITY): Payer: Self-pay | Admitting: *Deleted

## 2013-03-24 ENCOUNTER — Other Ambulatory Visit: Payer: Self-pay | Admitting: Urology

## 2013-03-24 ENCOUNTER — Ambulatory Visit (HOSPITAL_COMMUNITY)
Admission: AD | Admit: 2013-03-24 | Discharge: 2013-03-24 | Disposition: A | Discharge status: Home / Self Care | Payer: Medicare Other | Source: Ambulatory Visit | Attending: Urology | Admitting: Urology

## 2013-03-24 ENCOUNTER — Encounter (HOSPITAL_COMMUNITY): Payer: Self-pay | Admitting: Anesthesiology

## 2013-03-24 DIAGNOSIS — N1330 Unspecified hydronephrosis: Secondary | ICD-10-CM | POA: Insufficient documentation

## 2013-03-24 DIAGNOSIS — F79 Unspecified intellectual disabilities: Secondary | ICD-10-CM | POA: Insufficient documentation

## 2013-03-24 DIAGNOSIS — N201 Calculus of ureter: Secondary | ICD-10-CM | POA: Insufficient documentation

## 2013-03-24 DIAGNOSIS — N390 Urinary tract infection, site not specified: Secondary | ICD-10-CM | POA: Insufficient documentation

## 2013-03-24 DIAGNOSIS — N400 Enlarged prostate without lower urinary tract symptoms: Secondary | ICD-10-CM | POA: Insufficient documentation

## 2013-03-24 HISTORY — DX: Unspecified intellectual disabilities: F79

## 2013-03-24 HISTORY — PX: CYSTOSCOPY WITH STENT PLACEMENT: SHX5790

## 2013-03-24 SURGERY — CYSTOSCOPY, WITH STENT INSERTION
Anesthesia: General | Site: Urethra | Laterality: Right | Wound class: Clean Contaminated

## 2013-03-24 MED ORDER — PROMETHAZINE HCL 25 MG/ML IJ SOLN: 6.2500 mg | INTRAMUSCULAR | Status: DC | PRN

## 2013-03-24 MED ORDER — MUPIROCIN 2 % EX OINT: TOPICAL_OINTMENT | Freq: Two times a day (BID) | CUTANEOUS | Status: DC

## 2013-03-24 MED ORDER — CIPROFLOXACIN HCL 250 MG PO TABS: 250.0000 mg | ORAL_TABLET | Freq: Two times a day (BID) | ORAL | Status: DC

## 2013-03-24 MED ORDER — LIDOCAINE HCL 2 % EX GEL
CUTANEOUS | Status: DC | PRN
  Administered 2013-03-24: 1

## 2013-03-24 MED ORDER — LIDOCAINE HCL (CARDIAC) 20 MG/ML IV SOLN
INTRAVENOUS | Status: DC | PRN
  Administered 2013-03-24: 50 mg via INTRAVENOUS

## 2013-03-24 MED ORDER — IOHEXOL 300 MG/ML  SOLN
INTRAMUSCULAR | Status: AC
  Filled 2013-03-24: qty 1

## 2013-03-24 MED ORDER — LIDOCAINE HCL 2 % EX GEL
CUTANEOUS | Status: AC
  Filled 2013-03-24: qty 10

## 2013-03-24 MED ORDER — HYDROCODONE-ACETAMINOPHEN 5-325 MG PO TABS: 1.0000 | ORAL_TABLET | Freq: Four times a day (QID) | ORAL | Status: DC | PRN

## 2013-03-24 MED ORDER — FENTANYL CITRATE 0.05 MG/ML IJ SOLN
INTRAMUSCULAR | Status: DC | PRN
  Administered 2013-03-24: 100 ug via INTRAVENOUS

## 2013-03-24 MED ORDER — LACTATED RINGERS IV SOLN
INTRAVENOUS | Status: DC | PRN
  Administered 2013-03-24: 17:00:00 via INTRAVENOUS

## 2013-03-24 MED ORDER — MUPIROCIN 2 % EX OINT
TOPICAL_OINTMENT | CUTANEOUS | Status: AC
  Administered 2013-03-24: 1 via NASAL
  Filled 2013-03-24: qty 22

## 2013-03-24 MED ORDER — CEPHALEXIN 250 MG PO CAPS: 250.0000 mg | ORAL_CAPSULE | Freq: Four times a day (QID) | ORAL | Status: DC

## 2013-03-24 MED ORDER — BELLADONNA ALKALOIDS-OPIUM 16.2-60 MG RE SUPP
RECTAL | Status: AC
  Filled 2013-03-24: qty 1

## 2013-03-24 MED ORDER — MIDAZOLAM HCL 5 MG/5ML IJ SOLN
INTRAMUSCULAR | Status: DC | PRN
  Administered 2013-03-24 (×2): 1 mg via INTRAVENOUS

## 2013-03-24 MED ORDER — CEFAZOLIN SODIUM 1-5 GM-% IV SOLN
1.0000 g | INTRAVENOUS | Status: AC
  Administered 2013-03-24: 2 g via INTRAVENOUS

## 2013-03-24 MED ORDER — PROPOFOL 10 MG/ML IV BOLUS
INTRAVENOUS | Status: DC | PRN
  Administered 2013-03-24: 180 mg via INTRAVENOUS

## 2013-03-24 MED ORDER — ONDANSETRON HCL 4 MG/2ML IJ SOLN
INTRAMUSCULAR | Status: DC | PRN
  Administered 2013-03-24: 4 mg via INTRAVENOUS

## 2013-03-24 MED ORDER — CEFAZOLIN SODIUM-DEXTROSE 2-3 GM-% IV SOLR
INTRAVENOUS | Status: AC
  Filled 2013-03-24: qty 50

## 2013-03-24 MED ORDER — FENTANYL CITRATE 0.05 MG/ML IJ SOLN: 25.0000 ug | INTRAMUSCULAR | Status: DC | PRN

## 2013-03-24 MED ORDER — EPHEDRINE SULFATE 50 MG/ML IJ SOLN
INTRAMUSCULAR | Status: DC | PRN
  Administered 2013-03-24 (×2): 5 mg via INTRAVENOUS

## 2013-03-24 MED ORDER — LACTATED RINGERS IV SOLN: INTRAVENOUS | Status: DC

## 2013-03-24 MED ORDER — SODIUM CHLORIDE 0.9 % IR SOLN
Status: DC | PRN
  Administered 2013-03-24: 3000 mL

## 2013-03-24 SURGICAL SUPPLY — 16 items
ADAPTER CATH URET PLST 4-6FR (CATHETERS) ×3 IMPLANT
BAG URO CATCHER STRL LF (DRAPE) ×3 IMPLANT
CATH INTERMIT  6FR 70CM (CATHETERS) IMPLANT
CATH URET 5FR 28IN CONE TIP (BALLOONS)
CATH URET 5FR 70CM CONE TIP (BALLOONS) IMPLANT
CLOTH BEACON ORANGE TIMEOUT ST (SAFETY) ×3 IMPLANT
DRAPE CAMERA CLOSED 9X96 (DRAPES) ×3 IMPLANT
GLOVE BIOGEL M 7.0 STRL (GLOVE) ×3 IMPLANT
GOWN STRL NON-REIN LRG LVL3 (GOWN DISPOSABLE) ×6 IMPLANT
MANIFOLD NEPTUNE II (INSTRUMENTS) ×3 IMPLANT
MARKER SKIN DUAL TIP RULER LAB (MISCELLANEOUS) ×3 IMPLANT
NS IRRIG 1000ML POUR BTL (IV SOLUTION) ×3 IMPLANT
PACK CYSTO (CUSTOM PROCEDURE TRAY) ×3 IMPLANT
STENT CONTOUR 6FRX24X.038 (STENTS) ×3 IMPLANT
TUBING CONNECTING 10 (TUBING) ×3 IMPLANT
WIRE COONS/BENSON .038X145CM (WIRE) ×3 IMPLANT

## 2013-03-24 NOTE — Transfer of Care (Signed)
Immediate Anesthesia Transfer of Care Note  Patient: Joe [name]  Procedure(s) Performed: Procedure(s): CYSTOSCOPY WITH STENT PLACEMENT (Right)  Patient Location: PACU  Anesthesia Type:General  Level of Consciousness: awake, alert  and oriented  Airway & Oxygen Therapy: Patient Spontanous Breathing and Patient connected to face mask oxygen  Post-op Assessment: Report given to PACU RN and Post -op Vital signs reviewed and stable  Post vital signs: Reviewed and stable  Complications: No apparent anesthesia complications

## 2013-03-24 NOTE — H&P (Signed)
History of Present Illness   Joe [name] with PMH of mental retardation, is a patient of Dr. Sherron Monday with the following GU hx:  Urinary retention:  He was seen in ER for hallucinations and found to have urinary retention which resolved with Rapaflo.  His PVR during f/u visit in March 2014 was 70 ml.  Currently on tamsulosin 0.4mg  once daily.  Interval Hx:  Joe [name], accompanied by his mother, returns to office today w/ c/o acute onset of right flank pain that woke him up this morning.  Pain was sharp 8/10, radiates to left pain then to the suprapubic area.  Pain associated with intermittent chills and lost of appetite.  He has no desire for food and has not eaten anything all day today.  Denies fever/nausea/vomitting/dysuria/hematuria.  Denies any urinary voiding problems.  He took 1/2 dose of the 5-325mg  oxycodone for pain with improvement, current pain grade 2-3 out of 10. Denies any other alleviating or aggravating factors.  Denies hx of kidney stones but brother and cousins have hx of stones.   Past Medical History Problems  1. History of  Acute Urinary Retention 788.20 2. History of  Anxiety (Symptom) 300.00 3. History of  Anxiety (Symptom) 300.00 4. History of  Arthritis V13.4 5. History of  Arthritis V13.4 6. History of  Hypokalemia 276.8 7. History of  Mental Retardation (History) 8. History of  Mental Status Change 780.97 9. History of  Urge Incontinence Of Urine 788.31  Surgical History Problems  1. History of  Hernia Repair 2. History of  Shoulder Repair 3. History of  Shoulder Surgery 4. History of  Spine Excision  Allergies Medication  1. Levaquin TABS  Family History Problems  1. Paternal history of  Lung Cancer V16.1 2. Maternal history of  Malignant Lymphoma V16.7 3. Fraternal history of  Nephrolithiasis  Social History Problems  1. Caffeine Use 2 drinks daily 2. Marital History - Single 3. Never A Smoker 4. Occupation: Runner, broadcasting/film/video  5. History of  Alcohol Use 6. History of  Tobacco Use  Review of Systems Genitourinary system(s) were reviewed and pertinent findings if present are noted.  Genitourinary: no urinary frequency, no feelings of urinary urgency, no dysuria, no nocturia, no incomplete emptying of bladder, no post-void dribbling, no hematuria and no suprapubic pain.  Gastrointestinal: flank pain, but no nausea, no vomiting, no diarrhea and no constipation.  Constitutional: feeling poorly (malaise) and feeling tired (fatigue), but no fever and no night sweats.    Vitals Vital Signs [Data Includes: Last 1 Day]  13Jun2014 11:36AM  BMI Calculated: 23.54 BSA Calculated: 1.79 Height: 5 ft 7 in Weight: 150 lb  Blood Pressure: 116 / 73 Temperature: 98 F Heart Rate: 79  Physical Exam Constitutional: Well nourished and well developed . No acute distress.   Within normal limits  Amended By: Su Grand; 03/24/2013 5:32 PMEST  Normal. No adenopathy.  Amended By: Su Grand; 03/24/2013 5:32 PMEST  Lungs clear  Amended By: Su Grand; 03/24/2013 5:32 PMEST  Regular sinus rhythm  Amended By: Su Grand; 03/24/2013 5:32 PMEST Abdomen: The abdomen is soft and nontender. Suprapubic tenderness is present, but no epigastric tenderness. Right CVA tenderness, but no left CVA tenderness.   No skin rash  Amended By: Su Grand; 03/24/2013 5:32 PMEST  Mental retardation  Amended By: Su Grand; 03/24/2013 5:32 PMEST   Results/Data Urine Tempie Donning Includes: Last 1 Day]   13Jun2014  COLOR YELLOW   APPEARANCE CLOUDY   SPECIFIC GRAVITY 1.020  pH 8.0   GLUCOSE NEG mg/dL  BILIRUBIN NEG   KETONE NEG mg/dL  BLOOD LARGE   PROTEIN TRACE mg/dL  UROBILINOGEN 4 mg/dL  NITRITE POS   LEUKOCYTE ESTERASE SMALL   SQUAMOUS EPITHELIAL/HPF RARE   WBC 3-6 WBC/hpf  RBC 11-20 RBC/hpf  BACTERIA FEW   CRYSTALS NONE SEEN   CASTS NONE SEEN    The following images/tracing/specimen were independently visualized:  obstructing  right distal 7mm ureteral stone.  The following clinical lab reports were reviewed:  UA + RBC, + WBC, +bacteria.    Assessment Assessed  1. Flank Pain Right 2. Distal Ureteral Stone On The Right 592.1   At time of arrival to clinic, patient appears comfortable, non-toxic looking, except for 2-3 out of 10 right flank pain grade.  Afebrile, vitals normal.  However, 1 hour later, after completion of CT test, right flank pain increased to 8 out 10 but vitals remain normal.  He has an obstructing distal right ureteral stone of 7mm with right hydronephrosis.   Plan Flank Pain Right  1. Ketorolac Tromethamine 60 MG/2ML Intramuscular Solution; INJECT 60  MG Intramuscular;  Done: 13Jun2014 02:16PM; Status: COMPLETE   - Toradol 60mg  IM today for pain - Continue w/ tamsulosin home med. - Going OR tonight for right ureteral stent placement.  Patient maybe clinically infected w/ obstructing stone and + UA - Return to see Dr. Margarita Grizzle for future stone management of possible right ureteroscopy laser stone removal   Discussion/Summary  Informed patient and his mother of the CT result regarding the 7mm right ureteral stone and showed patient the stone location on the CT imaging. Consulted with on call physician, Dr. Brunilda Payor, and received confirmation that he will do stent placement today.  I drew picture of the stone location and explained reason for right ureteral stent placement and its risks of infection, ureteral /bladder perforation, kidney damage.  I also informed patient of possible side effects of stent: intermittent hematuria/dysuria/urinary frequency/urge incontinence.  We also discussed treatment timeline: stent today, treat infection w/ abx, return to see Dr. Margarita Grizzle for stone management option w/ possible ureteroscopy Holmium laser stone retrieval.  Answered all questions.  Patient and mom verbalized understanding and willing to proceed with stent placement today.   Signatures Electronically  signed by : Seward Grater, Dyann Ruddle; Mar 24 2013  4:19PM Electronically signed by : Alfredo Martinez, M.D.; Mar 24 2013  4:23PM Electronically signed by : Su Grand, M.D.; Mar 24 2013  5:35PM

## 2013-03-24 NOTE — Op Note (Signed)
Joe [name] is a 57 y.o.   03/24/2013  General  Preop diagnosis: Right ureteral stone, urinary tract infection.  Postop diagnosis: Same.  Procedure done: Cystoscopy and insertion of right double-J stent  Anesthesia: General  Surgeon: Wendie Simmer. Raynesha Tiedt  Indication: Patient is a 57 years old male with history of mental retardation who was seen in the office today with the sudden onset of severe right flank pain that woke him up this morning. The pain was associated with chills and loss of appetite. He does not have a history of kidney stone. CT scan revealed a 7 mm right ureteral stone. Urinalysis shows a 3-6 WBCs,  few bacteria and is nitrite positive. The pain has been intermittent on and off. Now after CT scan note flank pain increased to an 8 out of10. In view of the the abnormal urinalysis the plan is to place the double-J stent to relieve obstruction. The stone will be treated at a later date when the infection clears up with either ESL or ureteroscopy  Procedure: Patient was identified by his wrist band and proper timeout was taken.  Under general anesthesia he was prepped and draped and placed in the dorsolithotomy position. A panendoscope was inserted in the bladder. The urethra is normal. There is trilobar prostatic hypertrophy. The bladder mucosa is reddened. There is no stone or tumor in the bladder. The ureteral orifices are in normal position and shape.  A sensor wire was passed through the cystoscope and the right ureteral orifice and advanced in the renal pelvis. A #[id number] French-[id number] double-J stent was then passed over the sensor wire. When the proximal end of the double-J stent was noted in the renal pelvis the sensor wire was removed. The proximal curl of the double-J stent is in the collecting system; the distal curl is in the bladder. Cloudy urine drained out of the stent. The bladder was then emptied and the cystoscope was removed. There was no string attached to the double-J  stent.  Patient tolerated the procedure well and left the OR in satisfactory condition to postanesthesia care unit.

## 2013-03-24 NOTE — Anesthesia Preprocedure Evaluation (Addendum)
Anesthesia Evaluation  Patient identified by MRN, date of birth, ID band Patient awake    Reviewed: Allergy & Precautions, H&P , NPO status , Patient's Chart, lab work & pertinent test results  Airway Mallampati: II TM Distance: >3 FB Neck ROM: Full    Dental no notable dental hx.    Pulmonary neg pulmonary ROS,  breath sounds clear to auscultation  Pulmonary exam normal       Cardiovascular negative cardio ROS  Rhythm:Regular Rate:Normal     Neuro/Psych PSYCHIATRIC DISORDERS Anxiety H/O Mental disorder    GI/Hepatic negative GI ROS, Abnormal LFTs   Endo/Other  negative endocrine ROS  Renal/GU Renal disease  negative genitourinary   Musculoskeletal negative musculoskeletal ROS (+)   Abdominal   Peds negative pediatric ROS (+)  Hematology negative hematology ROS (+)   Anesthesia Other Findings   Reproductive/Obstetrics negative OB ROS                         Anesthesia Physical Anesthesia Plan  ASA: II  Anesthesia Plan: General   Post-op Pain Management:    Induction: Intravenous  Airway Management Planned: LMA  Additional Equipment:   Intra-op Plan:   Post-operative Plan: Extubation in OR  Informed Consent: I have reviewed the patients History and Physical, chart, labs and discussed the procedure including the risks, benefits and alternatives for the proposed anesthesia with the patient or authorized representative who has indicated his/her understanding and acceptance.   Dental advisory given  Plan Discussed with: CRNA  Anesthesia Plan Comments: (Discussed with patient's mother)      Anesthesia Quick Evaluation

## 2013-03-24 NOTE — Progress Notes (Signed)
PACU Nsg Note: Pt alert & oriented, per mental hx, cooperative, follows commands, interacting with nsg staff, mother at bedside, pt denies any pain or discomfort, pt able to ambulate to BR w/o assistance, gait very steady, pt voided very well, dark colored urine, pt taking PO's very well, denies any nausea, VSS. DC instructions reviewed with mother, pt teaching done re: (1) Rx's of Norco and Keflex, explained importance of taking all abx prescribed by MD, and safety while taking PO narcotics. (2) importance of handwashing, (3) post procedure diet when arriving home (4) importance of MD follow up appt, Teach Back method utilized, copy of DC instructions given to mother, again several opportunities for questions provided, pt escorted to exit via wc, mother at side.

## 2013-03-24 NOTE — Anesthesia Postprocedure Evaluation (Signed)
  Anesthesia Post-op Note  Patient: Joe [name]  Procedure(s) Performed: Procedure(s) (LRB): CYSTOSCOPY WITH STENT PLACEMENT (Right)  Patient Location: PACU  Anesthesia Type: General  Level of Consciousness: awake and alert   Airway and Oxygen Therapy: Patient Spontanous Breathing  Post-op Pain: mild  Post-op Assessment: Post-op Vital signs reviewed, Patient's Cardiovascular Status Stable, Respiratory Function Stable, Patent Airway and No signs of Nausea or vomiting  Last Vitals:  Filed Vitals:   03/24/13 1845  BP: 119/65  Pulse: 56  Temp:   Resp: 16    Post-op Vital Signs: stable   Complications: No apparent anesthesia complications

## 2013-03-27 ENCOUNTER — Encounter (HOSPITAL_COMMUNITY): Payer: Self-pay | Admitting: Urology

## 2013-04-03 ENCOUNTER — Other Ambulatory Visit: Payer: Self-pay | Admitting: Urology

## 2013-04-11 ENCOUNTER — Encounter (HOSPITAL_BASED_OUTPATIENT_CLINIC_OR_DEPARTMENT_OTHER): Payer: Self-pay | Admitting: *Deleted

## 2013-04-11 NOTE — Progress Notes (Signed)
SPOKE W/ PT MOTHER. PT IS COGNITIVE AND SPEECH DELAY'S WITH ANXIETY ABOUT SURGERY, LET MOTHER GO BACK. NPO AFTER MN WITH EXCEPTION CLEAR LIQUIDS UNTIL 0800 (NO CREAM/ MILK PRODUCTS). NEEDS HG. MAY TAKE ONE OF THE RX PAIN MED. W/ SIP OF WATER.

## 2013-04-19 ENCOUNTER — Ambulatory Visit (HOSPITAL_BASED_OUTPATIENT_CLINIC_OR_DEPARTMENT_OTHER): Payer: Medicare Other | Admitting: Anesthesiology

## 2013-04-19 ENCOUNTER — Ambulatory Visit (HOSPITAL_COMMUNITY): Payer: Medicare Other

## 2013-04-19 ENCOUNTER — Encounter (HOSPITAL_BASED_OUTPATIENT_CLINIC_OR_DEPARTMENT_OTHER): Payer: Self-pay | Admitting: Anesthesiology

## 2013-04-19 ENCOUNTER — Encounter (HOSPITAL_BASED_OUTPATIENT_CLINIC_OR_DEPARTMENT_OTHER): Payer: Self-pay

## 2013-04-19 ENCOUNTER — Encounter (HOSPITAL_BASED_OUTPATIENT_CLINIC_OR_DEPARTMENT_OTHER)
Admission: RE | Disposition: A | Discharge status: Home / Self Care | Payer: Self-pay | Source: Ambulatory Visit | Attending: Urology

## 2013-04-19 ENCOUNTER — Ambulatory Visit (HOSPITAL_BASED_OUTPATIENT_CLINIC_OR_DEPARTMENT_OTHER)
Admission: RE | Admit: 2013-04-19 | Discharge: 2013-04-19 | Disposition: A | Discharge status: Home / Self Care | Payer: Medicare Other | Source: Ambulatory Visit | Attending: Urology | Admitting: Urology

## 2013-04-19 DIAGNOSIS — Q625 Duplication of ureter: Secondary | ICD-10-CM

## 2013-04-19 DIAGNOSIS — N201 Calculus of ureter: Secondary | ICD-10-CM | POA: Insufficient documentation

## 2013-04-19 DIAGNOSIS — F79 Unspecified intellectual disabilities: Secondary | ICD-10-CM | POA: Insufficient documentation

## 2013-04-19 DIAGNOSIS — Q628 Other congenital malformations of ureter: Secondary | ICD-10-CM | POA: Insufficient documentation

## 2013-04-19 DIAGNOSIS — N200 Calculus of kidney: Secondary | ICD-10-CM | POA: Insufficient documentation

## 2013-04-19 HISTORY — PX: CYSTOSCOPY WITH RETROGRADE PYELOGRAM, URETEROSCOPY AND STENT PLACEMENT: SHX5789

## 2013-04-19 HISTORY — DX: Unspecified speech disturbances: R47.9

## 2013-04-19 HISTORY — PX: CYSTOSCOPY W/ URETERAL STENT REMOVAL: SHX1430

## 2013-04-19 HISTORY — DX: Other symptoms and signs involving cognitive functions and awareness: R41.89

## 2013-04-19 HISTORY — DX: Other specified congenital malformations of brain: Q04.8

## 2013-04-19 HISTORY — PX: HOLMIUM LASER APPLICATION: SHX5852

## 2013-04-19 HISTORY — DX: Calculus of ureter: N20.1

## 2013-04-19 HISTORY — DX: Expressive language disorder: F80.1

## 2013-04-19 SURGERY — CYSTOURETEROSCOPY, WITH RETROGRADE PYELOGRAM AND STENT INSERTION
Anesthesia: General | Site: Ureter | Laterality: Right | Wound class: Clean Contaminated

## 2013-04-19 MED ORDER — DEXAMETHASONE SODIUM PHOSPHATE 4 MG/ML IJ SOLN
INTRAMUSCULAR | Status: DC | PRN
  Administered 2013-04-19: 10 mg via INTRAVENOUS

## 2013-04-19 MED ORDER — CEPHALEXIN 500 MG PO CAPS: 500.0000 mg | ORAL_CAPSULE | Freq: Three times a day (TID) | ORAL | Status: DC

## 2013-04-19 MED ORDER — HYOSCYAMINE SULFATE 0.125 MG PO TABS: 0.1250 mg | ORAL_TABLET | ORAL | Status: DC | PRN

## 2013-04-19 MED ORDER — OXYCODONE-ACETAMINOPHEN 5-325 MG PO TABS: 1.0000 | ORAL_TABLET | ORAL | Status: DC | PRN

## 2013-04-19 MED ORDER — PROMETHAZINE HCL 25 MG/ML IJ SOLN
6.2500 mg | INTRAMUSCULAR | Status: DC | PRN
  Filled 2013-04-19: qty 1

## 2013-04-19 MED ORDER — BELLADONNA ALKALOIDS-OPIUM 16.2-60 MG RE SUPP
RECTAL | Status: DC | PRN
  Administered 2013-04-19: 1 via RECTAL

## 2013-04-19 MED ORDER — FENTANYL CITRATE 0.05 MG/ML IJ SOLN
25.0000 ug | INTRAMUSCULAR | Status: DC | PRN
  Filled 2013-04-19: qty 1

## 2013-04-19 MED ORDER — GLYCOPYRROLATE 0.2 MG/ML IJ SOLN
INTRAMUSCULAR | Status: DC | PRN
  Administered 2013-04-19: 0.2 mg via INTRAVENOUS

## 2013-04-19 MED ORDER — IOHEXOL 350 MG/ML SOLN
INTRAVENOUS | Status: DC | PRN
  Administered 2013-04-19: 12 mL

## 2013-04-19 MED ORDER — SODIUM CHLORIDE 0.9 % IR SOLN
Status: DC | PRN
  Administered 2013-04-19: 6000 mL

## 2013-04-19 MED ORDER — PROPOFOL 10 MG/ML IV BOLUS
INTRAVENOUS | Status: DC | PRN
  Administered 2013-04-19: 300 mg via INTRAVENOUS

## 2013-04-19 MED ORDER — PHENAZOPYRIDINE HCL 100 MG PO TABS: 100.0000 mg | ORAL_TABLET | Freq: Three times a day (TID) | ORAL | Status: DC | PRN

## 2013-04-19 MED ORDER — ONDANSETRON HCL 4 MG/2ML IJ SOLN
INTRAMUSCULAR | Status: DC | PRN
  Administered 2013-04-19: 4 mg via INTRAVENOUS

## 2013-04-19 MED ORDER — LIDOCAINE HCL (CARDIAC) 20 MG/ML IV SOLN
INTRAVENOUS | Status: DC | PRN
  Administered 2013-04-19: 100 mg via INTRAVENOUS

## 2013-04-19 MED ORDER — CEFAZOLIN SODIUM-DEXTROSE 2-3 GM-% IV SOLR
2.0000 g | INTRAVENOUS | Status: AC
  Administered 2013-04-19: 2 g via INTRAVENOUS
  Filled 2013-04-19: qty 50

## 2013-04-19 MED ORDER — MIDAZOLAM HCL 5 MG/5ML IJ SOLN
INTRAMUSCULAR | Status: DC | PRN
  Administered 2013-04-19: 2 mg via INTRAVENOUS

## 2013-04-19 MED ORDER — FENTANYL CITRATE 0.05 MG/ML IJ SOLN
INTRAMUSCULAR | Status: DC | PRN
  Administered 2013-04-19: 25 ug via INTRAVENOUS
  Administered 2013-04-19: 50 ug via INTRAVENOUS
  Administered 2013-04-19: 25 ug via INTRAVENOUS

## 2013-04-19 MED ORDER — LACTATED RINGERS IV SOLN
INTRAVENOUS | Status: DC
  Administered 2013-04-19 (×2): via INTRAVENOUS
  Filled 2013-04-19: qty 1000

## 2013-04-19 MED ORDER — SENNOSIDES-DOCUSATE SODIUM 8.6-50 MG PO TABS: 1.0000 | ORAL_TABLET | Freq: Two times a day (BID) | ORAL | Status: DC

## 2013-04-19 MED ORDER — LIDOCAINE HCL 2 % EX GEL
CUTANEOUS | Status: DC | PRN
  Administered 2013-04-19: 1

## 2013-04-19 MED ORDER — LACTATED RINGERS IV SOLN
INTRAVENOUS | Status: DC
  Filled 2013-04-19: qty 1000

## 2013-04-19 MED ORDER — MEPERIDINE HCL 25 MG/ML IJ SOLN
6.2500 mg | INTRAMUSCULAR | Status: DC | PRN
  Filled 2013-04-19: qty 1

## 2013-04-19 SURGICAL SUPPLY — 38 items
ADAPTER CATH URET PLST 4-6FR (CATHETERS) IMPLANT
BAG DRAIN URO-CYSTO SKYTR STRL (DRAIN) ×2 IMPLANT
BASKET LASER NITINOL 1.9FR (BASKET) IMPLANT
BASKET STNLS GEMINI 4WIRE 3FR (BASKET) IMPLANT
BASKET ZERO TIP NITINOL 2.4FR (BASKET) ×2 IMPLANT
BRUSH URET BIOPSY 3F (UROLOGICAL SUPPLIES) IMPLANT
CANISTER SUCT LVC 12 LTR MEDI- (MISCELLANEOUS) ×2 IMPLANT
CATH CLEAR GEL 3F BACKSTOP (CATHETERS) IMPLANT
CATH INTERMIT  6FR 70CM (CATHETERS) IMPLANT
CATH URET 5FR 28IN CONE TIP (BALLOONS)
CATH URET 5FR 28IN OPEN ENDED (CATHETERS) ×2 IMPLANT
CATH URET 5FR 70CM CONE TIP (BALLOONS) IMPLANT
CATH URET DUAL LUMEN 6-10FR 50 (CATHETERS) IMPLANT
CLOTH BEACON ORANGE TIMEOUT ST (SAFETY) ×2 IMPLANT
DRAPE CAMERA CLOSED 9X96 (DRAPES) ×2 IMPLANT
ELECT REM PT RETURN 9FT ADLT (ELECTROSURGICAL)
ELECTRODE REM PT RTRN 9FT ADLT (ELECTROSURGICAL) IMPLANT
GLOVE BIO SURGEON STRL SZ 6.5 (GLOVE) ×2 IMPLANT
GLOVE BIO SURGEON STRL SZ7 (GLOVE) ×2 IMPLANT
GLOVE ECLIPSE 7.0 STRL STRAW (GLOVE) ×2 IMPLANT
GLOVE INDICATOR 6.5 STRL GRN (GLOVE) ×2 IMPLANT
GLOVE INDICATOR 7.5 STRL GRN (GLOVE) ×2 IMPLANT
GOWN PREVENTION PLUS LG XLONG (DISPOSABLE) ×2 IMPLANT
GUIDEWIRE 0.038 PTFE COATED (WIRE) IMPLANT
GUIDEWIRE ANG ZIPWIRE 038X150 (WIRE) IMPLANT
GUIDEWIRE STR DUAL SENSOR (WIRE) ×2 IMPLANT
IV NS IRRIG 3000ML ARTHROMATIC (IV SOLUTION) ×4 IMPLANT
KIT BALLIN UROMAX 15FX10 (LABEL) IMPLANT
KIT BALLN UROMAX 15FX4 (MISCELLANEOUS) IMPLANT
KIT BALLN UROMAX 26 75X4 (MISCELLANEOUS)
LASER FIBER DISP (UROLOGICAL SUPPLIES) ×2 IMPLANT
PACK CYSTOSCOPY (CUSTOM PROCEDURE TRAY) ×2 IMPLANT
SET HIGH PRES BAL DIL (LABEL)
SHEATH ACCESS URETERAL 38CM (SHEATH) ×2 IMPLANT
SHEATH ACCESS URETERAL 54CM (SHEATH) IMPLANT
SHEATH URET ACCESS 12FR/35CM (UROLOGICAL SUPPLIES) IMPLANT
SHEATH URET ACCESS 12FR/55CM (UROLOGICAL SUPPLIES) IMPLANT
SYRINGE IRR TOOMEY STRL 70CC (SYRINGE) IMPLANT

## 2013-04-19 NOTE — H&P (Signed)
Urology History and Physical Exam  CC: Right ureter stones, right nephrolithiasis  HPI:  57 year old male presents today for nephrolithiasis and a ureter stone. This was discovered on a CT scan 03/24/13. He has a stone located in his right distal ureter. It appears to be 2 different stones. One measures 7 mm in size and the other is approximately 1 mm in size. He also has right-sided nephrolithiasis. This stone is located in the upper pole of the right kidney. It is approximately 6-7 mm in size. This was associated with flank pain and urinary tract infection. He had a right-sided ureter stent placed by Dr. Brunilda Payor 03/24/13 and was treated according to cultures with Keflex. He has completed the antibiotic. Repeat urine culture on 04/03/13 was negative for growth. He presents today for cystoscopy, right ureteroscopy, laser lithotripsy, possible right retrograde pyelogram, right ureter stent removal, possible right ureter stent placement.  PMH: Past Medical History  Diagnosis Date  . Right ureteral stone   . Cognitive deficits   . Anxiety     over surgery  . 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 Expressive speech delay   . Arthritis     BACK AND SHOULDER  . Congenital brain damage   . Speech impediment     PSH: Past Surgical History  Procedure Laterality Date  . Cystoscopy with stent placement Right 03/24/2013    Procedure: CYSTOSCOPY WITH STENT PLACEMENT;  Surgeon: Lindaann Slough, MD;  Location: [facility];  Service: Urology;  Laterality: Right;  . Inguinal hernia repair Left 03-04-2005  . Posterior lumbar fusion  08-27-2011    L4 -- L5  . Tonsillectomy  AS CHILD  . Right shoulder hemiarthroplasty  08-26-2010    OA    Allergies: Allergies  Allergen Reactions  . Levaquin (Levofloxacin In D5w) Diarrhea    SEVERE    Medications: No prescriptions prior to admission     Social History: History   Social History  . Marital Status:  Single    Spouse Name: N/A    Number of Children: N/A  . Years of Education: N/A   Occupational History  . Not on file.   Social History Main Topics  . Smoking status: Never Smoker   . Smokeless tobacco: Never Used  . Alcohol Use: No  . Drug Use: No  . Sexually Active: Not on file   Other Topics Concern  . 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    Family History: History reviewed. No pertinent family history.  Review of Systems: Positive: Right flank pain. Negative: Fever, chest pain, or SOB.  A further 10 point review of systems was negative except what is listed in the HPI.  Physical Exam: Filed Vitals:   04/19/13 1305  BP: 113/66  Pulse: 58  Temp: 97.1 F (36.2 C)  Resp: 18    General: No acute distress.  Awake. Head:  Normocephalic.  Atraumatic. ENT:  EOMI.  Mucous membranes moist Neck:  Supple.  No lymphadenopathy. CV:  S1 present. S2 present. Regular rate. Pulmonary: Equal effort bilaterally.  Clear to auscultation bilaterally. Abdomen: Soft.  Non- tender to palpation. Skin:  Normal turgor.  No visible rash. Extremity: No gross deformity of bilateral upper  extremities.  No gross deformity of    bilateral lower extremities. Neurologic: Alert. Appropriate mood.    Studies:  No results found for this basename: HGB, WBC, PLT,  in the last 72 hours  No results found for this basename: NA, K, CL, CO2, BUN, CREATININE, CALCIUM, MAGNESIUM, GFRNONAA, GFRAA,  in the last 72 hours   No results found for this basename: PT, INR, APTT,  in the last 72 hours   No components found with this basename: ABG,     Assessment:  Right ureter stones, right nephrolithiasis.  Plan: To OR for cystoscopy, right ureteroscopy, laser lithotripsy, possible right retrograde pyelogram,  right ureter stent removal, possible right ureter stent placement.

## 2013-04-19 NOTE — Op Note (Signed)
Urology Operative Report  Date of Procedure: 04/19/13  Surgeon: Natalia Leatherwood, MD Assistant:  None  Preoperative Diagnosis: Right ureter stone. Right nephrolithiasis. Postoperative Diagnosis:  Right ureter stone. Partially duplicated right ureter.  Procedure(s): Cystoscopy Right ureteroscopy Laser lithotripsy Basket stone removal Right retrograde pyelogram Right ureter stent removal  Estimated blood loss: Minimal  Specimen: Stone sent to [facility] lab for chemical analysis  Drains: none  Complications: None  Findings: Right ureter stone. Partially duplicated right ureter. Negative right nephrolithiasis.  History of present illness: 57 year old male presents today for right ureter stone. This was an obstructing stone which was associated with urinary tract infection. He had right ureter stent placed and his infection was treated. He had a CT scan which showed a right ureter stone and a right kidney stone. He presents today for ureteroscopy.   Procedure in detail: After informed consent was obtained, the patient was taken to the operating room. They were placed in the supine position. SCDs were turned on and in place. IV antibiotics were infused, and general anesthesia was induced. A timeout was performed in which the correct patient, surgical site, and procedure were identified and agreed upon by the team.  The patient was placed in a dorsolithotomy position, making sure to pad all pertinent neurovascular pressure points. The genitals were prepped and draped in the usual sterile fashion.  A rigid cystoscope was passed through the urethra and into the bladder. The bladder was drained. It was then fully distended and evaluated in a systematic fashion. There were no bladder tumors. Attention was turned to the right ureter orifice. A sensor tip wire was placed the side the ureter stent into the right renal pelvis on fluoroscopy. A stent grasper was used to remove the right ureter stent  and the wire was secured to the drape as a safety wire. The flexible digital ureter scope was then navigated into the distal right ureter where a stone was encountered. Lithotripsy was carried out with a holmium laser using a 200  fiber and settings of 0.5 J and 20 Hz. After lithotripsy was carried out the stone fragments were completely removed with a 0 tip Nitinol basket. These were set aside to be taken for chemical analysis at the [facility]. I then navigated the ureter scope up the ureter into the renal pelvis. I shot a retrograde pyelogram to outline the renal pelvis and all the calyces were systematically evaluated. They were free of stones. I withdrew the ureter scope were encountered bifurcation of the ureter. This was fairly distal in the ureter. I navigated up this other side and encountered what appeared to be a blind ending ureter. I shot a retrograde pyelogram on this side as well and this was confirmed. There were no stones in this area. The entirety of the right ureter was visualized as I removed the ureter scope and there were no injuries or mucosal disruptions. All of the stone fragments had been removed. I then removed the safety wire. I then replaced the cystoscope into the bladder and drained all the remaining stone fragments out of the bladder.  I injected 10 cc of lidocaine jelly into the urethra and placed a belladonna and opium suppository into the rectum. Anesthesia was reversed, he was placed in a supine position, and he was taken to the PACU in stable condition.  All counts were correct at the end of the case.

## 2013-04-19 NOTE — Anesthesia Procedure Notes (Signed)
Procedure Name: LMA Insertion Date/Time: 04/19/2013 2:35 PM Performed by: Norva Pavlov Pre-anesthesia Checklist: Patient identified, Emergency Drugs available, Suction available and Patient being monitored Patient Re-evaluated:Patient Re-evaluated prior to inductionOxygen Delivery Method: Circle System Utilized Preoxygenation: Pre-oxygenation with 100% oxygen Intubation Type: IV induction Ventilation: Mask ventilation without difficulty LMA: LMA inserted LMA Size: 4.0 Number of attempts: 1 Airway Equipment and Method: bite block Placement Confirmation: positive ETCO2 Tube secured with: Tape Dental Injury: Teeth and Oropharynx as per pre-operative assessment

## 2013-04-19 NOTE — Anesthesia Preprocedure Evaluation (Signed)
Anesthesia Evaluation  Patient identified by MRN, date of birth, ID band Patient awake    Reviewed: Allergy & Precautions, H&P , NPO status , Patient's Chart, lab work & pertinent test results  Airway Mallampati: II TM Distance: >3 FB Neck ROM: Full    Dental no notable dental hx.    Pulmonary neg pulmonary ROS,  breath sounds clear to auscultation  Pulmonary exam normal       Cardiovascular negative cardio ROS  Rhythm:Regular Rate:Normal     Neuro/Psych PSYCHIATRIC DISORDERS Anxiety H/O Mental disorder    GI/Hepatic negative GI ROS, Abnormal LFTs   Endo/Other  negative endocrine ROS  Renal/GU Renal disease  negative genitourinary   Musculoskeletal negative musculoskeletal ROS (+)   Abdominal   Peds negative pediatric ROS (+)  Hematology negative hematology ROS (+)   Anesthesia Other Findings   Reproductive/Obstetrics negative OB ROS                           Anesthesia Physical  Anesthesia Plan  ASA: II  Anesthesia Plan: General   Post-op Pain Management:    Induction: Intravenous  Airway Management Planned: LMA  Additional Equipment:   Intra-op Plan:   Post-operative Plan: Extubation in OR  Informed Consent: I have reviewed the patients History and Physical, chart, labs and discussed the procedure including the risks, benefits and alternatives for the proposed anesthesia with the patient or authorized representative who has indicated his/her understanding and acceptance.   Dental advisory given  Plan Discussed with: CRNA  Anesthesia Plan Comments: (Discussed with patient's mother)        Anesthesia Quick Evaluation

## 2013-04-19 NOTE — Transfer of Care (Signed)
Immediate Anesthesia Transfer of Care Note  Patient: Joe [name]  Procedure(s) Performed: Procedure(s) (LRB): CYSTOSCOPY WITH RETROGRADE PYELOGRAM, URETEROSCOPY  (Right) HOLMIUM LASER APPLICATION (Right) CYSTOSCOPY WITH STENT REMOVAL (Right)  Patient Location: PACU  Anesthesia Type: General  Level of Consciousness: awake, alert  and oriented  Airway & Oxygen Therapy: Patient Spontanous Breathing and Patient connected to face mask oxygen  Post-op Assessment: Report given to PACU RN and Post -op Vital signs reviewed and stable  Post vital signs: Reviewed and stable  Complications: No apparent anesthesia complications

## 2013-04-20 ENCOUNTER — Encounter (HOSPITAL_BASED_OUTPATIENT_CLINIC_OR_DEPARTMENT_OTHER): Payer: Self-pay | Admitting: Urology

## 2013-04-20 NOTE — Anesthesia Postprocedure Evaluation (Signed)
  Anesthesia Post-op Note  Patient: Joe [name]  Procedure(s) Performed: Procedure(s) (LRB): CYSTOSCOPY WITH RETROGRADE PYELOGRAM, URETEROSCOPY  (Right) HOLMIUM LASER APPLICATION (Right) CYSTOSCOPY WITH STENT REMOVAL (Right)  Patient Location: PACU  Anesthesia Type: General  Level of Consciousness: awake and alert   Airway and Oxygen Therapy: Patient Spontanous Breathing  Post-op Pain: mild  Post-op Assessment: Post-op Vital signs reviewed, Patient's Cardiovascular Status Stable, Respiratory Function Stable, Patent Airway and No signs of Nausea or vomiting  Last Vitals:  Filed Vitals:   04/19/13 1730  BP: 151/83  Pulse: 58  Temp: 36.6 C  Resp: 16    Post-op Vital Signs: stable   Complications: No apparent anesthesia complications

## 2013-12-21 ENCOUNTER — Other Ambulatory Visit: Payer: Self-pay | Admitting: Orthopedic Surgery

## 2014-01-04 ENCOUNTER — Encounter (HOSPITAL_COMMUNITY): Payer: Self-pay | Admitting: Pharmacy Technician

## 2014-01-09 ENCOUNTER — Encounter (HOSPITAL_COMMUNITY): Payer: Self-pay

## 2014-01-09 ENCOUNTER — Encounter (HOSPITAL_COMMUNITY)
Admission: RE | Admit: 2014-01-09 | Discharge: 2014-01-09 | Disposition: A | Discharge status: Home / Self Care | Payer: Medicare Other | Source: Ambulatory Visit | Attending: Orthopedic Surgery | Admitting: Orthopedic Surgery

## 2014-01-09 DIAGNOSIS — Z01812 Encounter for preprocedural laboratory examination: Secondary | ICD-10-CM | POA: Insufficient documentation

## 2014-01-09 HISTORY — DX: Constipation, unspecified: K59.00

## 2014-01-09 HISTORY — DX: Umbilical hernia without obstruction or gangrene: K42.9

## 2014-01-09 LAB — SURGICAL PCR SCREEN
MRSA, PCR: NEGATIVE
Staphylococcus aureus: NEGATIVE

## 2014-01-09 LAB — APTT: APTT: 34 s (ref 24–37)

## 2014-01-09 LAB — CBC
HEMATOCRIT: 41.7 % (ref 39.0–52.0)
Hemoglobin: 15.2 g/dL (ref 13.0–17.0)
MCH: 36.3 pg — ABNORMAL HIGH (ref 26.0–34.0)
MCHC: 36.5 g/dL — ABNORMAL HIGH (ref 30.0–36.0)
MCV: 99.5 fL (ref 78.0–100.0)
PLATELETS: 228 10*3/uL (ref 150–400)
RBC: 4.19 MIL/uL — AB (ref 4.22–5.81)
RDW: 12.5 % (ref 11.5–15.5)
WBC: 7.3 10*3/uL (ref 4.0–10.5)

## 2014-01-09 LAB — BASIC METABOLIC PANEL
BUN: 15 mg/dL (ref 6–23)
CALCIUM: 9.8 mg/dL (ref 8.4–10.5)
CO2: 23 mEq/L (ref 19–32)
Chloride: 103 mEq/L (ref 96–112)
Creatinine, Ser: 0.63 mg/dL (ref 0.50–1.35)
GFR calc Af Amer: >90 mL/min (ref >90)
GFR calc non Af Amer: >90 mL/min (ref >90)
Glucose, Bld: 90 mg/dL (ref 70–99)
Potassium: 4.7 mEq/L (ref 3.7–5.3)
Sodium: 139 mEq/L (ref 137–147)

## 2014-01-09 LAB — PROTIME-INR
INR: 1.05 (ref 0.00–1.49)
Prothrombin Time: 13.5 seconds (ref 11.6–15.2)

## 2014-01-09 LAB — TYPE AND SCREEN
ABO/RH(D): A POS
Antibody Screen: NEGATIVE

## 2014-01-09 NOTE — Pre-Procedure Instructions (Signed)
Joe [name]  01/09/2014   Your procedure is scheduled on:  Tuesday, April 7.  Report to [facility], Main Entrance [name]" at 8:00 AM.  Call this number if you have problems the morning of surgery: [phone]   Remember:   Do not eat food or drink liquids after midnight, Monday, April 6.    Take these medicines the morning of surgery with A SIP OF WATER: Take if needed:promethazine (PHENERGAN), traMADol (ULTRAM).     Stop taking :naproxen sodium (ANAPROX), and Vitamins.   Do not wear jewelry, make-up or nail polish.  Do not wear lotions, powders, or perfumes.             Men may shave face and neck.  Do not bring valuables to the hospital.  [facility] is not responsible for any belongings or valuables.               Contacts, dentures or bridgework may not be worn into surgery.  Leave suitcase in the car. After surgery it may be brought to your room.  For patients admitted to the hospital, discharge time is determined by your  treatment team.            Special Instructions: Review  [facility] - Preparing For Surgery.   Please read over the following fact sheets that you were given: Pain Booklet, Coughing and Deep Breathing, Blood Transfusion Information and Surgical Site Infection Prevention

## 2014-01-15 IMAGING — CR DG ABDOMEN 2V
2 series · 2 of 2 positions shown · non-contrast
Comparison: Lumbar radiographs 08/27/2011.  Barium enema
03/18/2009.

CLINICAL DATA: 56-year-old male with abdominal pain and distention
nausea and vomiting.

ABDOMEN - 2 VIEW

[w abdomen decub]
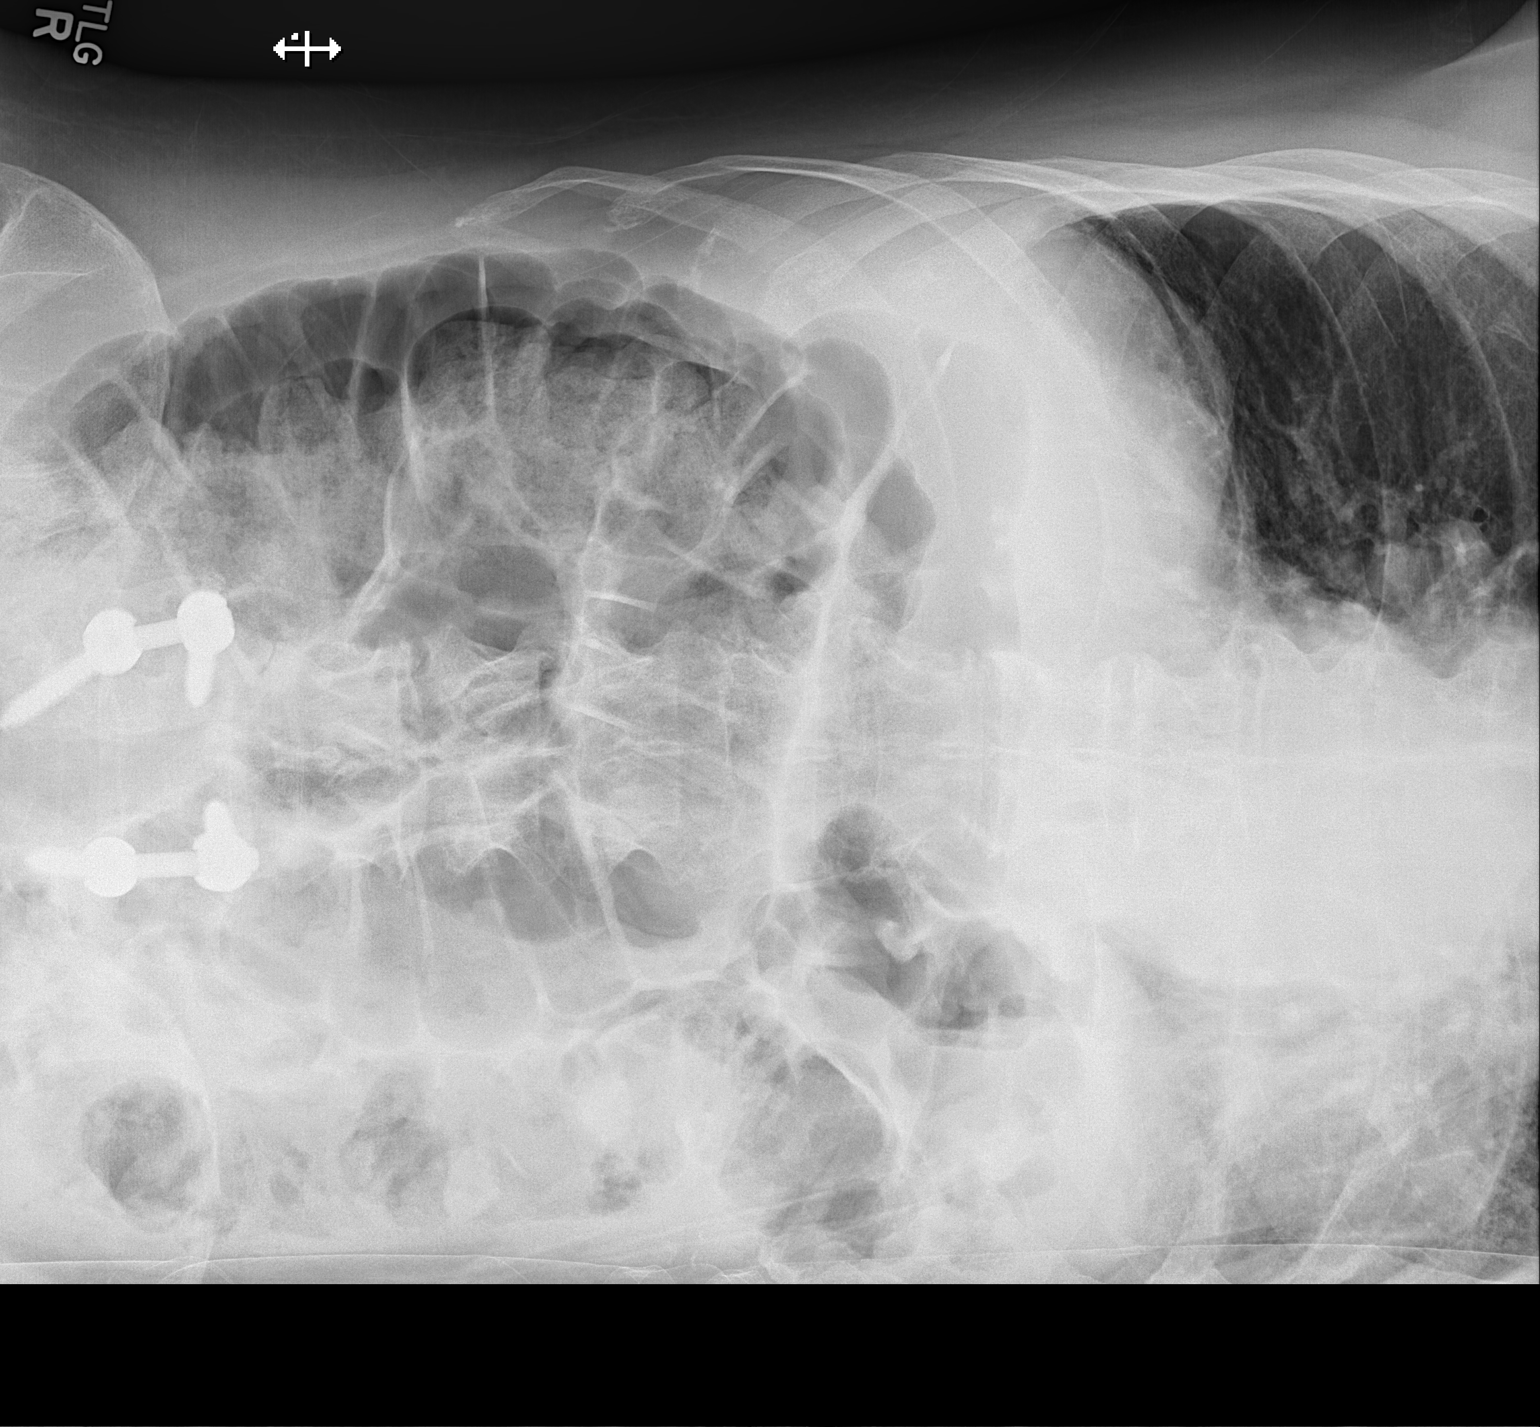

[x abdomen supine]
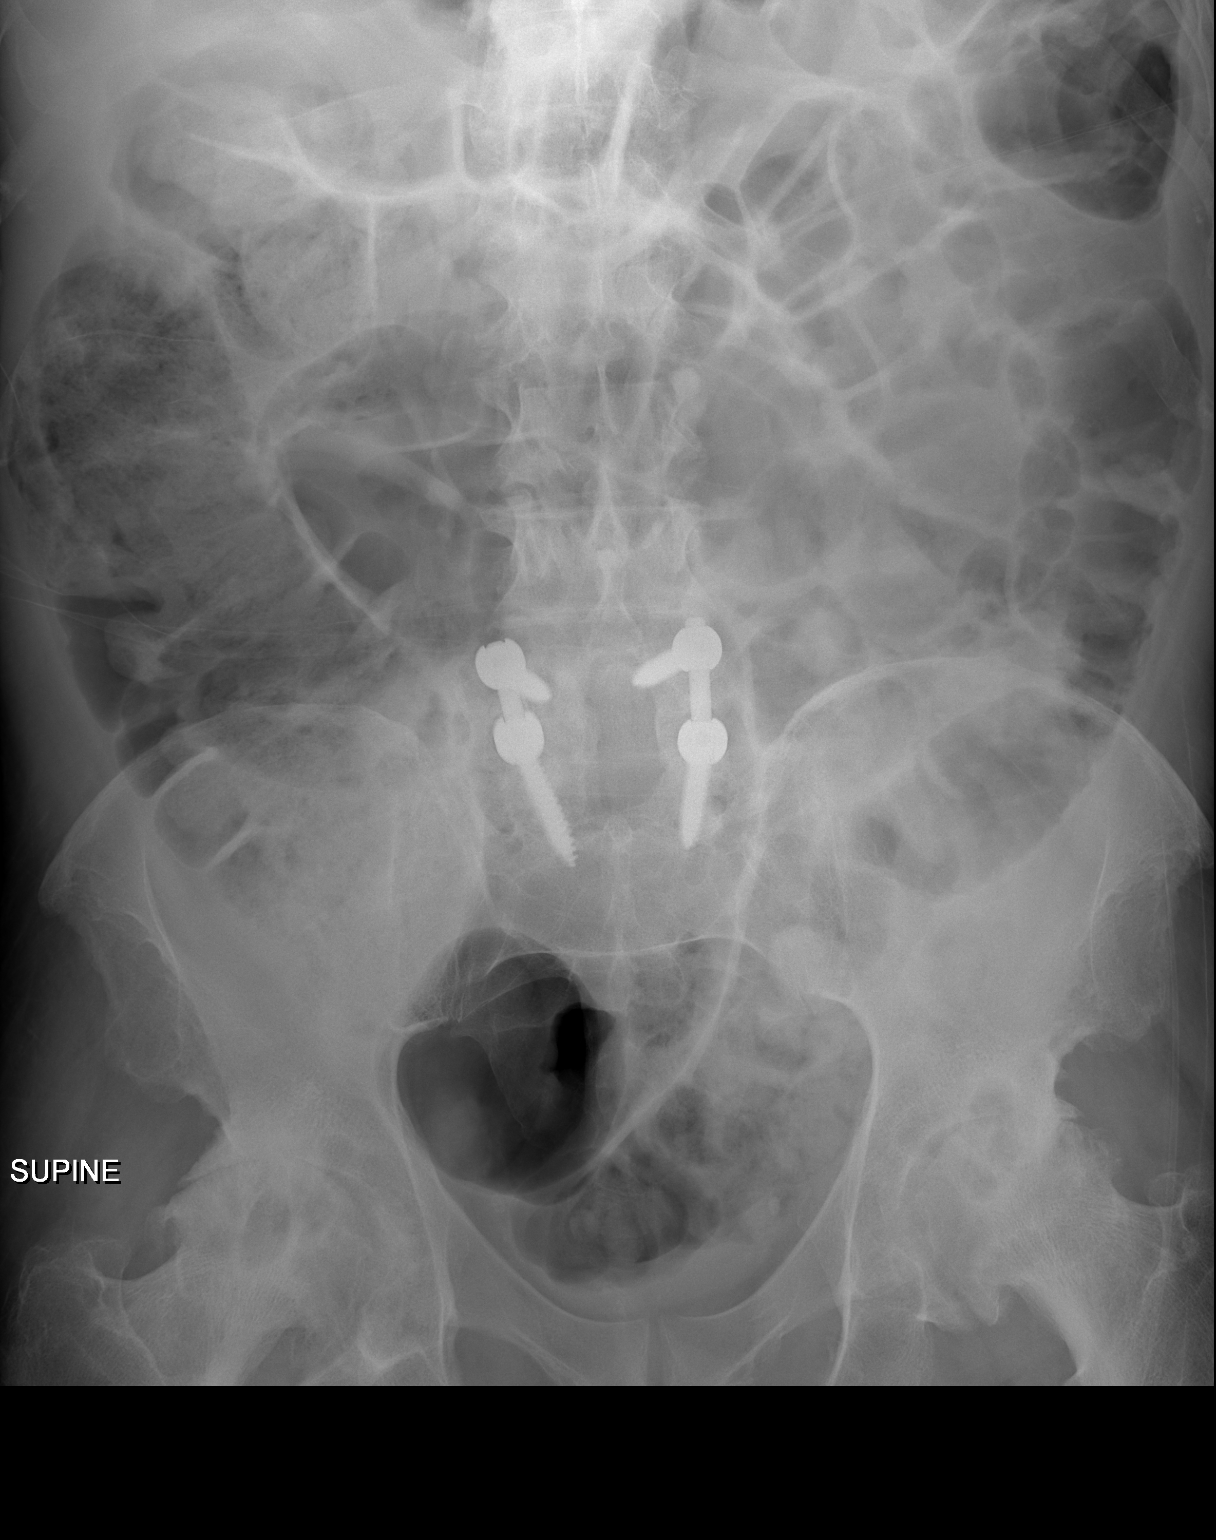

[2 of 2 positions shown; findings below may reference images not displayed]

FINDINGS: Supine and left-side down lateral decubitus views of the
abdomen.  No pneumoperitoneum.  Gas throughout the colon which is
redundant as seen in 7282.  Gas in some small bowel, but no
definitely dilated small bowel loops. Lower lumbar fusion hardware.
Interval very pronounced bilateral hip joint space loss with
subchondral sclerosis and bulky subchondral cysts.  No definite
acute osseous abnormality.
IMPRESSION: 1.  Gas throughout redundant colon, favor ileus.  No
pneumoperitoneum or strong evidence of mechanical bowel
obstruction.  Follow-up films may be helpful.
2.  Advanced bilateral hip joint osteoarthritis with significant
progression since [DATE].

## 2014-01-15 MED ORDER — CEFAZOLIN SODIUM-DEXTROSE 2-3 GM-% IV SOLR
2.0000 g | INTRAVENOUS | Status: AC
  Administered 2014-01-16: 2 g via INTRAVENOUS
  Filled 2014-01-15: qty 50

## 2014-01-16 ENCOUNTER — Inpatient Hospital Stay (HOSPITAL_COMMUNITY)
Admission: RE | Admit: 2014-01-16 | Discharge: 2014-01-18 | DRG: 470 | Disposition: A | Discharge status: Home Health | Payer: Medicare Other | Source: Ambulatory Visit | Attending: Orthopedic Surgery | Admitting: Orthopedic Surgery

## 2014-01-16 ENCOUNTER — Inpatient Hospital Stay (HOSPITAL_COMMUNITY): Payer: Medicare Other

## 2014-01-16 ENCOUNTER — Inpatient Hospital Stay (HOSPITAL_COMMUNITY): Payer: Medicare Other | Admitting: Anesthesiology

## 2014-01-16 ENCOUNTER — Encounter (HOSPITAL_COMMUNITY)
Admission: RE | Disposition: A | Discharge status: Home Health | Payer: Self-pay | Source: Ambulatory Visit | Attending: Orthopedic Surgery

## 2014-01-16 ENCOUNTER — Encounter (HOSPITAL_COMMUNITY): Payer: Medicare Other | Admitting: Anesthesiology

## 2014-01-16 ENCOUNTER — Encounter (HOSPITAL_COMMUNITY): Payer: Self-pay | Admitting: *Deleted

## 2014-01-16 DIAGNOSIS — M169 Osteoarthritis of hip, unspecified: Principal | ICD-10-CM | POA: Diagnosis present

## 2014-01-16 DIAGNOSIS — F8089 Other developmental disorders of speech and language: Secondary | ICD-10-CM | POA: Diagnosis present

## 2014-01-16 DIAGNOSIS — E871 Hypo-osmolality and hyponatremia: Secondary | ICD-10-CM | POA: Diagnosis not present

## 2014-01-16 DIAGNOSIS — Z881 Allergy status to other antibiotic agents status: Secondary | ICD-10-CM

## 2014-01-16 DIAGNOSIS — F79 Unspecified intellectual disabilities: Secondary | ICD-10-CM | POA: Diagnosis present

## 2014-01-16 DIAGNOSIS — M1612 Unilateral primary osteoarthritis, left hip: Secondary | ICD-10-CM | POA: Diagnosis present

## 2014-01-16 DIAGNOSIS — F411 Generalized anxiety disorder: Secondary | ICD-10-CM | POA: Diagnosis present

## 2014-01-16 DIAGNOSIS — Z87820 Personal history of traumatic brain injury: Secondary | ICD-10-CM

## 2014-01-16 DIAGNOSIS — M1610 Unilateral primary osteoarthritis, unspecified hip: Principal | ICD-10-CM | POA: Diagnosis present

## 2014-01-16 HISTORY — DX: Unilateral primary osteoarthritis, left hip: M16.12

## 2014-01-16 HISTORY — PX: TOTAL HIP ARTHROPLASTY: SHX124

## 2014-01-16 LAB — CBC
HEMATOCRIT: 34.5 % — AB (ref 39.0–52.0)
Hemoglobin: 12.2 g/dL — ABNORMAL LOW (ref 13.0–17.0)
MCH: 35.3 pg — ABNORMAL HIGH (ref 26.0–34.0)
MCHC: 35.4 g/dL (ref 30.0–36.0)
MCV: 99.7 fL (ref 78.0–100.0)
Platelets: 204 10*3/uL (ref 150–400)
RBC: 3.46 MIL/uL — ABNORMAL LOW (ref 4.22–5.81)
RDW: 12.8 % (ref 11.5–15.5)
WBC: 10.9 10*3/uL — ABNORMAL HIGH (ref 4.0–10.5)

## 2014-01-16 LAB — CREATININE, SERUM
Creatinine, Ser: 0.69 mg/dL (ref 0.50–1.35)
GFR calc Af Amer: >90 mL/min (ref >90)

## 2014-01-16 SURGERY — ARTHROPLASTY, HIP, TOTAL,POSTERIOR APPROACH
Anesthesia: General | Site: Hip | Laterality: Left

## 2014-01-16 MED ORDER — CEFAZOLIN SODIUM-DEXTROSE 2-3 GM-% IV SOLR
2.0000 g | Freq: Four times a day (QID) | INTRAVENOUS | Status: AC
  Administered 2014-01-16 (×2): 2 g via INTRAVENOUS
  Filled 2014-01-16 (×2): qty 50

## 2014-01-16 MED ORDER — OXYCODONE HCL 5 MG/5ML PO SOLN: 5.0000 mg | Freq: Once | ORAL | Status: DC | PRN

## 2014-01-16 MED ORDER — ACETAMINOPHEN 650 MG RE SUPP
650.0000 mg | Freq: Four times a day (QID) | RECTAL | Status: DC | PRN
Start: 2014-01-16 — End: 2014-01-18
  Filled 2014-01-16: qty 1

## 2014-01-16 MED ORDER — LIDOCAINE HCL (CARDIAC) 20 MG/ML IV SOLN
INTRAVENOUS | Status: DC | PRN
  Administered 2014-01-16: 80 mg via INTRAVENOUS

## 2014-01-16 MED ORDER — HYDROMORPHONE HCL PF 1 MG/ML IJ SOLN
0.2500 mg | INTRAMUSCULAR | Status: DC | PRN
  Administered 2014-01-16: 0.25 mg via INTRAVENOUS
  Administered 2014-01-16: 0.5 mg via INTRAVENOUS
  Administered 2014-01-16: 0.25 mg via INTRAVENOUS

## 2014-01-16 MED ORDER — ACETAMINOPHEN 325 MG PO TABS
650.0000 mg | ORAL_TABLET | Freq: Four times a day (QID) | ORAL | Status: DC | PRN
  Administered 2014-01-17 – 2014-01-18 (×2): 650 mg via ORAL
  Filled 2014-01-16 (×2): qty 2

## 2014-01-16 MED ORDER — ROCURONIUM BROMIDE 50 MG/5ML IV SOLN
INTRAVENOUS | Status: AC
  Filled 2014-01-16: qty 1

## 2014-01-16 MED ORDER — PROPOFOL 10 MG/ML IV BOLUS
INTRAVENOUS | Status: DC | PRN
  Administered 2014-01-16: 110 mg via INTRAVENOUS

## 2014-01-16 MED ORDER — BISACODYL 10 MG RE SUPP: 10.0000 mg | Freq: Every day | RECTAL | Status: DC | PRN

## 2014-01-16 MED ORDER — PROMETHAZINE HCL 25 MG PO TABS: 25.0000 mg | ORAL_TABLET | Freq: Four times a day (QID) | ORAL | Status: DC | PRN

## 2014-01-16 MED ORDER — BUPIVACAINE HCL (PF) 0.25 % IJ SOLN
INTRAMUSCULAR | Status: DC | PRN
  Administered 2014-01-16: 10 mL

## 2014-01-16 MED ORDER — DIAZEPAM 5 MG PO TABS: 5.0000 mg | ORAL_TABLET | Freq: Four times a day (QID) | ORAL | Status: DC | PRN

## 2014-01-16 MED ORDER — FENTANYL CITRATE 0.05 MG/ML IJ SOLN
50.0000 ug | Freq: Once | INTRAMUSCULAR | Status: DC
Start: 2014-01-16 — End: 2014-01-16

## 2014-01-16 MED ORDER — LACTATED RINGERS IV SOLN
INTRAVENOUS | Status: DC
  Administered 2014-01-16: 09:00:00 via INTRAVENOUS

## 2014-01-16 MED ORDER — EPHEDRINE SULFATE 50 MG/ML IJ SOLN
INTRAMUSCULAR | Status: DC | PRN
  Administered 2014-01-16 (×4): 5 mg via INTRAVENOUS
  Administered 2014-01-16: 10 mg via INTRAVENOUS

## 2014-01-16 MED ORDER — GLYCOPYRROLATE 0.2 MG/ML IJ SOLN
INTRAMUSCULAR | Status: DC | PRN
  Administered 2014-01-16: 0.4 mg via INTRAVENOUS

## 2014-01-16 MED ORDER — METHOCARBAMOL 500 MG PO TABS
500.0000 mg | ORAL_TABLET | Freq: Four times a day (QID) | ORAL | Status: DC | PRN
  Administered 2014-01-16 – 2014-01-18 (×4): 500 mg via ORAL
  Filled 2014-01-16 (×6): qty 1

## 2014-01-16 MED ORDER — ONDANSETRON HCL 4 MG/2ML IJ SOLN: 4.0000 mg | Freq: Four times a day (QID) | INTRAMUSCULAR | Status: DC | PRN

## 2014-01-16 MED ORDER — PHENOL 1.4 % MT LIQD: 1.0000 | OROMUCOSAL | Status: DC | PRN

## 2014-01-16 MED ORDER — FENTANYL CITRATE 0.05 MG/ML IJ SOLN
INTRAMUSCULAR | Status: AC
  Filled 2014-01-16: qty 5

## 2014-01-16 MED ORDER — FENTANYL CITRATE 0.05 MG/ML IJ SOLN
INTRAMUSCULAR | Status: DC | PRN
  Administered 2014-01-16: 100 ug via INTRAVENOUS
  Administered 2014-01-16: 50 ug via INTRAVENOUS

## 2014-01-16 MED ORDER — DEXAMETHASONE 6 MG PO TABS
10.0000 mg | ORAL_TABLET | Freq: Three times a day (TID) | ORAL | Status: AC
  Administered 2014-01-16 – 2014-01-17 (×2): 10 mg via ORAL
  Filled 2014-01-16 (×3): qty 1

## 2014-01-16 MED ORDER — METHOCARBAMOL 500 MG PO TABS: 500.0000 mg | ORAL_TABLET | Freq: Four times a day (QID) | ORAL | Status: DC

## 2014-01-16 MED ORDER — MIDAZOLAM HCL 2 MG/2ML IJ SOLN
INTRAMUSCULAR | Status: AC
  Filled 2014-01-16: qty 2

## 2014-01-16 MED ORDER — NEOSTIGMINE METHYLSULFATE 1 MG/ML IJ SOLN
INTRAMUSCULAR | Status: DC | PRN
  Administered 2014-01-16: 3 mg via INTRAVENOUS

## 2014-01-16 MED ORDER — MIDAZOLAM HCL 5 MG/5ML IJ SOLN
INTRAMUSCULAR | Status: DC | PRN
  Administered 2014-01-16: 2 mg via INTRAVENOUS

## 2014-01-16 MED ORDER — BUPIVACAINE HCL (PF) 0.25 % IJ SOLN
INTRAMUSCULAR | Status: AC
  Filled 2014-01-16: qty 30

## 2014-01-16 MED ORDER — ONDANSETRON HCL 4 MG PO TABS: 4.0000 mg | ORAL_TABLET | Freq: Four times a day (QID) | ORAL | Status: DC | PRN

## 2014-01-16 MED ORDER — HYDROMORPHONE HCL PF 1 MG/ML IJ SOLN
INTRAMUSCULAR | Status: AC
  Administered 2014-01-16: 0.25 mg via INTRAVENOUS
  Filled 2014-01-16: qty 1

## 2014-01-16 MED ORDER — ENOXAPARIN SODIUM 40 MG/0.4ML ~~LOC~~ SOLN: 40.0000 mg | SUBCUTANEOUS | Status: DC

## 2014-01-16 MED ORDER — POTASSIUM CHLORIDE IN NACL 20-0.45 MEQ/L-% IV SOLN
INTRAVENOUS | Status: DC
  Administered 2014-01-16: 75 mL/h via INTRAVENOUS
  Filled 2014-01-16 (×5): qty 1000

## 2014-01-16 MED ORDER — DIAZEPAM 5 MG PO TABS
5.0000 mg | ORAL_TABLET | Freq: Three times a day (TID) | ORAL | Status: DC | PRN
  Administered 2014-01-16 – 2014-01-17 (×2): 5 mg via ORAL
  Filled 2014-01-16 (×2): qty 1

## 2014-01-16 MED ORDER — PHENYLEPHRINE HCL 10 MG/ML IJ SOLN
INTRAMUSCULAR | Status: DC | PRN
  Administered 2014-01-16: 80 ug via INTRAVENOUS
  Administered 2014-01-16: 40 ug via INTRAVENOUS
  Administered 2014-01-16 (×2): 80 ug via INTRAVENOUS

## 2014-01-16 MED ORDER — MIDAZOLAM HCL 2 MG/2ML IJ SOLN: 1.0000 mg | INTRAMUSCULAR | Status: DC | PRN

## 2014-01-16 MED ORDER — ALUM & MAG HYDROXIDE-SIMETH 200-200-20 MG/5ML PO SUSP
30.0000 mL | ORAL | Status: DC | PRN
  Administered 2014-01-17 (×2): 30 mL via ORAL
  Filled 2014-01-16 (×2): qty 30

## 2014-01-16 MED ORDER — NEOSTIGMINE METHYLSULFATE 1 MG/ML IJ SOLN
INTRAMUSCULAR | Status: AC
  Filled 2014-01-16: qty 10

## 2014-01-16 MED ORDER — GLYCOPYRROLATE 0.2 MG/ML IJ SOLN
INTRAMUSCULAR | Status: AC
  Filled 2014-01-16: qty 3

## 2014-01-16 MED ORDER — OXYCODONE-ACETAMINOPHEN 10-325 MG PO TABS: 1.0000 | ORAL_TABLET | Freq: Four times a day (QID) | ORAL | Status: DC | PRN

## 2014-01-16 MED ORDER — METOCLOPRAMIDE HCL 5 MG/ML IJ SOLN: 5.0000 mg | Freq: Three times a day (TID) | INTRAMUSCULAR | Status: DC | PRN

## 2014-01-16 MED ORDER — DEXAMETHASONE SODIUM PHOSPHATE 10 MG/ML IJ SOLN
10.0000 mg | Freq: Three times a day (TID) | INTRAMUSCULAR | Status: AC
  Administered 2014-01-16: 10 mg via INTRAVENOUS
  Filled 2014-01-16 (×3): qty 1

## 2014-01-16 MED ORDER — ROCURONIUM BROMIDE 100 MG/10ML IV SOLN
INTRAVENOUS | Status: DC | PRN
  Administered 2014-01-16: 50 mg via INTRAVENOUS

## 2014-01-16 MED ORDER — HYDROMORPHONE HCL PF 1 MG/ML IJ SOLN: 0.5000 mg | INTRAMUSCULAR | Status: DC | PRN

## 2014-01-16 MED ORDER — LIDOCAINE HCL (CARDIAC) 20 MG/ML IV SOLN
INTRAVENOUS | Status: AC
  Filled 2014-01-16: qty 5

## 2014-01-16 MED ORDER — OXYCODONE HCL 5 MG PO TABS
5.0000 mg | ORAL_TABLET | ORAL | Status: DC | PRN
  Administered 2014-01-16 – 2014-01-17 (×4): 10 mg via ORAL
  Filled 2014-01-16 (×3): qty 2

## 2014-01-16 MED ORDER — OXYCODONE HCL 5 MG PO TABS: 5.0000 mg | ORAL_TABLET | Freq: Once | ORAL | Status: DC | PRN

## 2014-01-16 MED ORDER — ALBUMIN HUMAN 5 % IV SOLN
INTRAVENOUS | Status: DC | PRN
  Administered 2014-01-16: 12:00:00 via INTRAVENOUS

## 2014-01-16 MED ORDER — MAGNESIUM CITRATE PO SOLN: 1.0000 | Freq: Once | ORAL | Status: AC | PRN

## 2014-01-16 MED ORDER — DEXTROSE 5 % IV SOLN
500.0000 mg | Freq: Four times a day (QID) | INTRAVENOUS | Status: DC | PRN
  Filled 2014-01-16: qty 5

## 2014-01-16 MED ORDER — DIPHENHYDRAMINE HCL 12.5 MG/5ML PO ELIX: 12.5000 mg | ORAL_SOLUTION | ORAL | Status: DC | PRN

## 2014-01-16 MED ORDER — POLYETHYLENE GLYCOL 3350 17 G PO PACK
17.0000 g | PACK | Freq: Every day | ORAL | Status: DC | PRN
  Administered 2014-01-17: 17 g via ORAL
  Filled 2014-01-16: qty 1

## 2014-01-16 MED ORDER — ONDANSETRON HCL 4 MG/2ML IJ SOLN
INTRAMUSCULAR | Status: AC
  Filled 2014-01-16: qty 2

## 2014-01-16 MED ORDER — ENOXAPARIN SODIUM 40 MG/0.4ML ~~LOC~~ SOLN
40.0000 mg | SUBCUTANEOUS | Status: DC
  Administered 2014-01-17 – 2014-01-18 (×2): 40 mg via SUBCUTANEOUS
  Filled 2014-01-16 (×3): qty 0.4

## 2014-01-16 MED ORDER — SENNA 8.6 MG PO TABS
1.0000 | ORAL_TABLET | Freq: Two times a day (BID) | ORAL | Status: DC
  Administered 2014-01-16 – 2014-01-17 (×3): 8.6 mg via ORAL
  Filled 2014-01-16 (×5): qty 1

## 2014-01-16 MED ORDER — TRAMADOL HCL 50 MG PO TABS
25.0000 mg | ORAL_TABLET | Freq: Two times a day (BID) | ORAL | Status: DC | PRN
  Administered 2014-01-16 – 2014-01-17 (×2): 25 mg via ORAL
  Filled 2014-01-16 (×2): qty 1

## 2014-01-16 MED ORDER — MENTHOL 3 MG MT LOZG: 1.0000 | LOZENGE | OROMUCOSAL | Status: DC | PRN

## 2014-01-16 MED ORDER — SENNA-DOCUSATE SODIUM 8.6-50 MG PO TABS: 2.0000 | ORAL_TABLET | Freq: Every day | ORAL | Status: DC

## 2014-01-16 MED ORDER — DOCUSATE SODIUM 100 MG PO CAPS
100.0000 mg | ORAL_CAPSULE | Freq: Two times a day (BID) | ORAL | Status: DC
  Administered 2014-01-16 – 2014-01-18 (×4): 100 mg via ORAL
  Filled 2014-01-16 (×5): qty 1

## 2014-01-16 MED ORDER — OXYCODONE HCL 5 MG PO TABS
ORAL_TABLET | ORAL | Status: AC
  Administered 2014-01-16: 10 mg via ORAL
  Filled 2014-01-16: qty 2

## 2014-01-16 MED ORDER — ONDANSETRON HCL 4 MG/2ML IJ SOLN
INTRAMUSCULAR | Status: DC | PRN
  Administered 2014-01-16: 4 mg via INTRAVENOUS

## 2014-01-16 MED ORDER — LACTATED RINGERS IV SOLN
INTRAVENOUS | Status: DC | PRN
  Administered 2014-01-16 (×2): via INTRAVENOUS

## 2014-01-16 MED ORDER — PROMETHAZINE HCL 25 MG/ML IJ SOLN: 6.2500 mg | INTRAMUSCULAR | Status: DC | PRN

## 2014-01-16 MED ORDER — 0.9 % SODIUM CHLORIDE (POUR BTL) OPTIME
TOPICAL | Status: DC | PRN
  Administered 2014-01-16: 1000 mL

## 2014-01-16 MED ORDER — METHOCARBAMOL 500 MG PO TABS
ORAL_TABLET | ORAL | Status: AC
  Administered 2014-01-16: 500 mg via ORAL
  Filled 2014-01-16: qty 1

## 2014-01-16 MED ORDER — METOCLOPRAMIDE HCL 10 MG PO TABS: 5.0000 mg | ORAL_TABLET | Freq: Three times a day (TID) | ORAL | Status: DC | PRN

## 2014-01-16 SURGICAL SUPPLY — 57 items
BENZOIN TINCTURE PRP APPL 2/3 (GAUZE/BANDAGES/DRESSINGS) ×3 IMPLANT
BLADE SAW SAG 73X25 THK (BLADE) ×2
BLADE SAW SGTL 73X25 THK (BLADE) ×1 IMPLANT
BRUSH FEMORAL CANAL (MISCELLANEOUS) IMPLANT
CAPT HIP PF COP ×3 IMPLANT
CLOSURE STERI-STRIP 1/2X4 (GAUZE/BANDAGES/DRESSINGS) ×1
CLSR STERI-STRIP ANTIMIC 1/2X4 (GAUZE/BANDAGES/DRESSINGS) ×2 IMPLANT
COVER SURGICAL LIGHT HANDLE (MISCELLANEOUS) ×3 IMPLANT
DRAPE INCISE IOBAN 66X45 STRL (DRAPES) IMPLANT
DRAPE ORTHO SPLIT 77X108 STRL (DRAPES) ×4
DRAPE PROXIMA HALF (DRAPES) ×3 IMPLANT
DRAPE SURG ORHT 6 SPLT 77X108 (DRAPES) ×2 IMPLANT
DRAPE U-SHAPE 47X51 STRL (DRAPES) ×3 IMPLANT
DRILL BIT 5/64 (BIT) ×3 IMPLANT
DRSG MEPILEX BORDER 4X12 (GAUZE/BANDAGES/DRESSINGS) ×3 IMPLANT
DRSG MEPILEX BORDER 4X8 (GAUZE/BANDAGES/DRESSINGS) IMPLANT
DRSG PAD ABDOMINAL 8X10 ST (GAUZE/BANDAGES/DRESSINGS) IMPLANT
DURAPREP 26ML APPLICATOR (WOUND CARE) ×3 IMPLANT
ELECT CAUTERY BLADE 6.4 (BLADE) ×3 IMPLANT
ELECT REM PT RETURN 9FT ADLT (ELECTROSURGICAL) ×3
ELECTRODE REM PT RTRN 9FT ADLT (ELECTROSURGICAL) ×1 IMPLANT
GLOVE BIOGEL PI ORTHO PRO SZ8 (GLOVE) ×2
GLOVE ORTHO TXT STRL SZ7.5 (GLOVE) ×3 IMPLANT
GLOVE PI ORTHO PRO STRL SZ8 (GLOVE) ×1 IMPLANT
GLOVE SURG ORTHO 8.0 STRL STRW (GLOVE) ×3 IMPLANT
GOWN STRL REUS W/ TWL LRG LVL3 (GOWN DISPOSABLE) ×1 IMPLANT
GOWN STRL REUS W/TWL LRG LVL3 (GOWN DISPOSABLE) ×2
HANDPIECE INTERPULSE COAX TIP (DISPOSABLE)
HOOD PEEL AWAY FACE SHEILD DIS (HOOD) ×6 IMPLANT
KIT BASIN OR (CUSTOM PROCEDURE TRAY) ×3 IMPLANT
KIT ROOM TURNOVER OR (KITS) ×3 IMPLANT
MANIFOLD NEPTUNE II (INSTRUMENTS) ×3 IMPLANT
NEEDLE HYPO 25GX1X1/2 BEV (NEEDLE) ×3 IMPLANT
NS IRRIG 1000ML POUR BTL (IV SOLUTION) ×3 IMPLANT
PACK TOTAL JOINT (CUSTOM PROCEDURE TRAY) ×3 IMPLANT
PAD ARMBOARD 7.5X6 YLW CONV (MISCELLANEOUS) ×6 IMPLANT
PILLOW ABDUCTION HIP (SOFTGOODS) ×3 IMPLANT
PRESSURIZER FEMORAL UNIV (MISCELLANEOUS) IMPLANT
RETRIEVER SUT HEWSON (MISCELLANEOUS) ×3 IMPLANT
SET HNDPC FAN SPRY TIP SCT (DISPOSABLE) IMPLANT
SPONGE GAUZE 4X4 12PLY (GAUZE/BANDAGES/DRESSINGS) ×3 IMPLANT
SPONGE LAP 4X18 X RAY DECT (DISPOSABLE) IMPLANT
SUCTION FRAZIER TIP 10 FR DISP (SUCTIONS) ×3 IMPLANT
SUT FIBERWIRE #2 38 REV NDL BL (SUTURE) ×9
SUT MNCRL AB 4-0 PS2 18 (SUTURE) ×3 IMPLANT
SUT VIC AB 0 CT1 27 (SUTURE) ×2
SUT VIC AB 0 CT1 27XBRD ANBCTR (SUTURE) ×1 IMPLANT
SUT VIC AB 2-0 CT1 27 (SUTURE) ×2
SUT VIC AB 2-0 CT1 TAPERPNT 27 (SUTURE) ×1 IMPLANT
SUT VIC AB 3-0 SH 8-18 (SUTURE) ×3 IMPLANT
SUTURE FIBERWR#2 38 REV NDL BL (SUTURE) ×3 IMPLANT
SYR CONTROL 10ML LL (SYRINGE) ×3 IMPLANT
TOWEL OR 17X24 6PK STRL BLUE (TOWEL DISPOSABLE) ×3 IMPLANT
TOWEL OR 17X26 10 PK STRL BLUE (TOWEL DISPOSABLE) ×3 IMPLANT
TOWER CARTRIDGE SMART MIX (DISPOSABLE) IMPLANT
TRAY FOLEY CATH 14FR (SET/KITS/TRAYS/PACK) IMPLANT
WATER STERILE IRR 1000ML POUR (IV SOLUTION) ×6 IMPLANT

## 2014-01-16 NOTE — Anesthesia Postprocedure Evaluation (Signed)
  Anesthesia Post-op Note  Patient: Joe [name]  Procedure(s) Performed: Procedure(s): TOTAL HIP ARTHROPLASTY (Left)  Patient Location: PACU  Anesthesia Type:General  Level of Consciousness: awake and alert   Airway and Oxygen Therapy: Patient Spontanous Breathing  Post-op Pain: mild  Post-op Assessment: Post-op Vital signs reviewed, Patient's Cardiovascular Status Stable, Respiratory Function Stable, Patent Airway, No signs of Nausea or vomiting and Pain level controlled  Post-op Vital Signs: Reviewed and stable  Complications: No apparent anesthesia complications

## 2014-01-16 NOTE — Discharge Instructions (Signed)
Diet: As you were doing prior to hospitalization  ° °Shower:  May shower but keep the wounds dry, use an occlusive plastic wrap, NO SOAKING IN TUB.  If the bandage gets wet, change with a clean dry gauze. ° °Dressing:  You may change your dressing 3-5 days after surgery.  Then change the dressing daily with sterile gauze dressing.   ° °There are sticky tapes (steri-strips) on your wounds and all the stitches are absorbable.  Leave the steri-strips in place when changing your dressings, they will peel off with time, usually 2-3 weeks. ° °Activity:  Increase activity slowly as tolerated, but follow the weight bearing instructions below.  No lifting or driving for 6 weeks. ° °Weight Bearing:   As tolerated.   ° °To prevent constipation: you may use a stool softener such as - ° °Colace (over the counter) 100 mg by mouth twice a day  °Drink plenty of fluids (prune juice may be helpful) and high fiber foods °Miralax (over the counter) for constipation as needed.   ° °Itching:  If you experience itching with your medications, try taking only a single pain pill, or even half a pain pill at a time.  You may take up to 10 pain pills per day, and you can also use benadryl over the counter for itching or also to help with sleep.  ° °Precautions:  If you experience chest pain or shortness of breath - call 911 immediately for transfer to the hospital emergency department!! ° °If you develop a fever greater that 101 F, purulent drainage from wound, increased redness or drainage from wound, or calf pain -- Call the office at [phone]                                                °Follow- Up Appointment:  Please call for an appointment to be seen in 2 weeks [place] - [phone] ° ° ° ° ° °

## 2014-01-16 NOTE — Anesthesia Preprocedure Evaluation (Addendum)
Anesthesia Evaluation  Patient identified by MRN, date of birth, ID band Patient awake    Reviewed: Allergy & Precautions, H&P , NPO status , Patient's Chart, lab work & pertinent test results  Airway Mallampati: II TM Distance: >3 FB Neck ROM: Full    Dental no notable dental hx. (+) Teeth Intact, Dental Advisory Given   Pulmonary neg pulmonary ROS,  breath sounds clear to auscultation  Pulmonary exam normal       Cardiovascular negative cardio ROS  Rhythm:Regular Rate:Normal     Neuro/Psych PSYCHIATRIC DISORDERS Anxiety Mental retardationH/O Mental disorder    GI/Hepatic negative GI ROS, Abnormal LFTs   Endo/Other  negative endocrine ROS  Renal/GU   negative genitourinary   Musculoskeletal negative musculoskeletal ROS (+)   Abdominal   Peds negative pediatric ROS (+)  Hematology negative hematology ROS (+)   Anesthesia Other Findings   Reproductive/Obstetrics negative OB ROS                          Anesthesia Physical Anesthesia Plan  ASA: III  Anesthesia Plan: General   Post-op Pain Management:    Induction: Intravenous  Airway Management Planned: Oral ETT  Additional Equipment:   Intra-op Plan:   Post-operative Plan: Extubation in OR  Informed Consent: I have reviewed the patients History and Physical, chart, labs and discussed the procedure including the risks, benefits and alternatives for the proposed anesthesia with the patient or authorized representative who has indicated his/her understanding and acceptance.     Plan Discussed with: CRNA and Surgeon  Anesthesia Plan Comments:         Anesthesia Quick Evaluation

## 2014-01-16 NOTE — Progress Notes (Signed)
PT Cancellation Note  Patient Details Name: Joe [name] MRN: [id number] DOB: 1956-04-15   Cancelled Treatment:    Reason Eval/Treat Not Completed: Patient declined, no reason specified Pt in room with his mother and aunt. Pt states he would prefer to rest at this time and start with therapy tomorrow. Will follow up first thing tomorrow for PT Evaluation/Treat.  [place], [place]   [name] 01/16/2014, 4:17 PM

## 2014-01-16 NOTE — Transfer of Care (Signed)
Immediate Anesthesia Transfer of Care Note  Patient: Joe [name]  Procedure(s) Performed: Procedure(s): TOTAL HIP ARTHROPLASTY (Left)  Patient Location: PACU  Anesthesia Type:General  Level of Consciousness: awake and alert   Airway & Oxygen Therapy: Patient Spontanous Breathing and Patient connected to nasal cannula oxygen  Post-op Assessment: Report given to PACU RN, Post -op Vital signs reviewed and stable and Patient moving all extremities X 4  Post vital signs: Reviewed and stable  Complications: No apparent anesthesia complications

## 2014-01-16 NOTE — Op Note (Signed)
01/16/2014  1:14 PM  PATIENT:  Joe [name]   MRN: [id number]  PRE-OPERATIVE DIAGNOSIS:  DJD LT HIP  POST-OPERATIVE DIAGNOSIS:  Degenerative Joint Disease Left Hip  PROCEDURE:  Procedure(s): TOTAL HIP ARTHROPLASTY  PREOPERATIVE INDICATIONS:    Joe [name] is an 58 y.o. male who has a diagnosis of Osteoarthritis of left hip and elected for surgical management after failing conservative treatment.  The risks benefits and alternatives were discussed with the patient including but not limited to the risks of nonoperative treatment, versus surgical intervention including infection, bleeding, nerve injury, periprosthetic fracture, the need for revision surgery, dislocation, leg length discrepancy, blood clots, cardiopulmonary complications, morbidity, mortality, among others, and they were willing to proceed.     OPERATIVE REPORT     SURGEON:   , MD    ASSISTANT:   [place], PA-C  (Present throughout the entire procedure,  necessary for completion of procedure in a timely manner, assisting with retraction, instrumentation, and closure)     ANESTHESIA:  General    COMPLICATIONS:  None.     COMPONENTS:  Depuy [place] Press fit femur size 5 hi-offset with a 36 mm +5 head ball and a gription acetabular shell size 54 with a 10 degree lipped polyethylene liner    PROCEDURE IN DETAIL:   The patient was met in the holding area and  identified.  The appropriate hip was identified and marked at the operative site.  The patient was then transported to the OR  and  placed under general anesthesia.  At that point, the patient was  placed in the lateral decubitus position with the operative side up and  secured to the operating room table and all bony prominences padded.     The operative lower extremity was prepped from the iliac crest to the distal leg.  Sterile draping was performed.  Time out was performed prior to incision.      A routine posterolateral approach was  utilized via sharp dissection  carried down to the subcutaneous tissue.  Gross bleeders were Bovie coagulated.  The iliotibial band was identified and incised along the length of the skin incision.  Self-retaining retractors were  inserted.  With the hip internally rotated, the short external rotators  were identified. The piriformis and capsule was tagged with FiberWire, and the hip capsule released in a T-type fashion.  The femoral neck was exposed, and I resected the femoral neck using the appropriate jig. This was performed at approximately a thumb's breadth above the lesser trochanter.    I then exposed the deep acetabulum, cleared out any tissue including the ligamentum teres.  A wing retractor was placed.  There were hypertrophic osteophyte, particularly anteriorly and inferiorly on the acetabulum. The native landmarks were somewhat difficult to assess. After adequate visualization, I excised the labrum, and then sequentially reamed.  I placed the trial acetabulum, which seated nicely, and then impacted the real cup into place.  Appropriate version and inclination was confirmed clinically matching their bony anatomy, and also with the use of the jig. I adjusted the inclination at final impaction, in order to achieve more inclination, because I felt like I was somewhat flat. Because there was still able to adjust the position, I did feel that backing the fixation up with a screw was prudent, which I did using a 20 mm screw. I suspect that this was a bicortical screw, posterior superior. Upon inspection, I then realized that actually I had caught some of the FiberWire tagging   sutures underneath the shell of the cup, and so the sutures were cut, because the shell was already well fixed. I replaced the sutures on the capsule inferiorly and superiorly.  A trial polyethylene liner was placed and the wing retractor removed.    I then prepared the proximal femur using the cookie-cutter, the lateralizing  reamer, and then sequentially reamed and broached.  A trial broach, neck, and head was utilized, and I reduced the hip, and initially I trialed with a +1.5 and standard neck, which did not adequately restore stability or leg length, and so I went to the high offset with the +5 neck. This eliminated the shuck, and had better stability, with functional range of motion. The trial components were then removed, and the real polyethylene liner was placed with the lip directed posteriorly. I also excised osteophytes inferiorly, as well as to some degree anteriorly.  I then impacted the real femoral prosthesis into place into the appropriate version, slightly anteverted to the normal anatomy, and I impacted the real head ball into place. The hip was then reduced and taken through functional range of motion and found to have excellent stability. Leg lengths were restored.  I then used a 2 mm drill bits to pass the FiberWire suture from the capsule and piriformis through the greater trochanter, and secured this. Satisfactory posterior repair was achieved, although I suspect that the high offset was slightly more offset than his native position, such that the posterior repair did not quite fully reached the posterior bone.  Nonetheless it was the best posterior capsular repair possible. I also closed the T in the capsule.  I then irrigated the hip copiously again with pulse lavage, and repaired the fascia with Vicryl, followed by Vicryl for the subcutaneous tissue, Monocryl for the skin, Steri-Strips and sterile gauze. The wounds were injected. The patient was then awakened and returned to PACU in stable and satisfactory condition. There were no complications.  Marchia Bond, MD Orthopedic Surgeon [phone]   01/16/2014 1:14 PM

## 2014-01-16 NOTE — Anesthesia Procedure Notes (Addendum)
Procedure Name: Intubation Date/Time: 01/16/2014 11:00 AM Performed by: Blair Heys E Pre-anesthesia Checklist: Patient identified, Emergency Drugs available, Suction available and Patient being monitored Patient Re-evaluated:Patient Re-evaluated prior to inductionOxygen Delivery Method: Circle system utilized Preoxygenation: Pre-oxygenation with 100% oxygen Intubation Type: IV induction Ventilation: Mask ventilation without difficulty Laryngoscope Size: 4 and Mac Grade View: Grade III Tube type: Oral (grade 3 view with MAC 4 blade SRNA; grade 1 view with glidescope) Tube size: 7.5 mm Number of attempts: 2 Airway Equipment and Method: Video-laryngoscopy and Stylet Placement Confirmation: ETT inserted through vocal cords under direct vision,  breath sounds checked- equal and bilateral,  positive ETCO2 and CO2 detector Secured at: 23 cm Tube secured with: Tape Dental Injury: Teeth and Oropharynx as per pre-operative assessment  Difficulty Due To: Difficulty was anticipated

## 2014-01-16 NOTE — H&P (Signed)
PREOPERATIVE H&P  Chief Complaint: DJD LT HIP  HPI: Joe [name] is a 58 y.o. male who presents for preoperative history and physical with a diagnosis of DJD LT HIP. Symptoms are rated as moderate to severe, and have been worsening.  This is significantly impairing activities of daily living.  He has elected for surgical management. He has failed injections, activity modification, assistive walking device, anti-inflammatories, among others. Does have a history of a kidney stone as well.  Past Medical History  Diagnosis Date  . Right ureteral stone   . Cognitive deficits   . Anxiety     over surgery  . 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 Expressive speech delay   . Arthritis     BACK AND SHOULDER  . Congenital brain damage   . Speech impediment   . Umbilical hernia   . Complication of anesthesia   . Constipation    Past Surgical History  Procedure Laterality Date  . Cystoscopy with stent placement Right 03/24/2013    Procedure: CYSTOSCOPY WITH STENT PLACEMENT;  Surgeon: Hanley Ben, MD;  Location: [facility];  Service: Urology;  Laterality: Right;  . Inguinal hernia repair Left 03-04-2005  . Posterior lumbar fusion  08-27-2011    L4 -- L5  . Tonsillectomy  AS CHILD  . Right shoulder hemiarthroplasty  08-26-2010    OA  . Cystoscopy with retrograde pyelogram, ureteroscopy and stent placement Right 04/19/2013    Procedure: CYSTOSCOPY WITH RETROGRADE PYELOGRAM, URETEROSCOPY ;  Surgeon: Molli Hazard, MD;  Location: [facility];  Service: Urology;  Laterality: Right;  . Holmium laser application Right 11/15/4008    Procedure: HOLMIUM LASER APPLICATION;  Surgeon: Molli Hazard, MD;  Location: [facility];  Service: Urology;  Laterality: Right;  . Cystoscopy w/ ureteral stent removal Right 04/19/2013    Procedure: CYSTOSCOPY WITH STENT REMOVAL;  Surgeon: Molli Hazard, MD;  Location:  [facility];  Service: Urology;  Laterality: Right;   History   Social History  . Marital Status: Single    Spouse Name: N/A    Number of Children: N/A  . Years of Education: N/A   Social History Main Topics  . Smoking status: Never Smoker   . Smokeless tobacco: Never Used  . Alcohol Use: No  . Drug Use: No  . Sexual Activity: None   Other Topics Concern  . 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  Lives with parents.   History reviewed. No pertinent family history. Allergies  Allergen Reactions  . Levaquin [Levofloxacin In D5w] Diarrhea    SEVERE   Prior to Admission medications   Medication Sig Start Date End Date Taking? Authorizing Provider  Ascorbic Acid (VITAMIN C PO) Take 500 mg by mouth daily.   Yes Historical Provider, MD  naproxen sodium (ANAPROX) 220 MG tablet Take 440 mg by mouth 2 (two) times daily as needed (pain).   Yes Historical Provider, MD  promethazine (PHENERGAN) 25 MG tablet Take 12.5 mg by mouth 2 (two) times daily as needed for nausea or vomiting.   Yes Historical Provider, MD  sennosides-docusate sodium (SENOKOT-S) 8.6-50 MG tablet Take 1 tablet by mouth daily as needed for constipation.   Yes Historical  Provider, MD  traMADol (ULTRAM) 50 MG tablet Take 25 mg by mouth 2 (two) times daily as needed for moderate pain.   Yes Historical Provider, MD     Positive ROS: All other systems have been reviewed and were otherwise negative with the exception of those mentioned in the HPI and as above.  Physical Exam: General: Alert, no acute distress Cardiovascular: No pedal edema Respiratory: No cyanosis, no use of accessory musculature GI: No organomegaly, abdomen is soft and non-tender Skin: No lesions in the area of chief complaint Neurologic: Sensation intact  distally Psychiatric: Patient is competent for consent with normal mood and affect Lymphatic: No axillary or cervical lymphadenopathy  MUSCULOSKELETAL: Left hip has range of motion 0-80 at most, no internal rotation, left side is slightly shorter than the right. EHL is intact.  Assessment: DJD LT HIP  Plan: Plan for Procedure(s): TOTAL HIP ARTHROPLASTY  The risks benefits and alternatives were discussed with the patient including but not limited to the risks of nonoperative treatment, versus surgical intervention including infection, bleeding, nerve injury,  blood clots, cardiopulmonary complications, morbidity, mortality, among others, and they were willing to proceed.   Johnny Bridge, MD Cell [phone]   01/16/2014 9:59 AM

## 2014-01-16 NOTE — Progress Notes (Signed)
Utilization Review Completed.Joe [name] T4/04/2014  

## 2014-01-17 LAB — CBC
HEMATOCRIT: 38.5 % — AB (ref 39.0–52.0)
Hemoglobin: 13.5 g/dL (ref 13.0–17.0)
MCH: 35 pg — AB (ref 26.0–34.0)
MCHC: 35.1 g/dL (ref 30.0–36.0)
MCV: 99.7 fL (ref 78.0–100.0)
Platelets: 238 10*3/uL (ref 150–400)
RBC: 3.86 MIL/uL — ABNORMAL LOW (ref 4.22–5.81)
RDW: 12.7 % (ref 11.5–15.5)
WBC: 9.6 10*3/uL (ref 4.0–10.5)

## 2014-01-17 LAB — BASIC METABOLIC PANEL
BUN: 12 mg/dL (ref 6–23)
CHLORIDE: 95 meq/L — AB (ref 96–112)
CO2: 26 mEq/L (ref 19–32)
CREATININE: 0.62 mg/dL (ref 0.50–1.35)
Calcium: 9.5 mg/dL (ref 8.4–10.5)
GFR calc non Af Amer: >90 mL/min (ref >90)
Glucose, Bld: 140 mg/dL — ABNORMAL HIGH (ref 70–99)
Potassium: 4.5 mEq/L (ref 3.7–5.3)
Sodium: 135 mEq/L — ABNORMAL LOW (ref 137–147)

## 2014-01-17 LAB — GLUCOSE, CAPILLARY: Glucose-Capillary: 140 mg/dL — ABNORMAL HIGH (ref 70–99)

## 2014-01-17 IMAGING — CR DG ABDOMEN 2V
2 series · 2 of 2 positions shown · non-contrast
Comparison: Abdomen films of 12/13/2012

CLINICAL DATA: Abdominal pain, possible ileus

ABDOMEN - 2 VIEW

[w abdomen decub *]
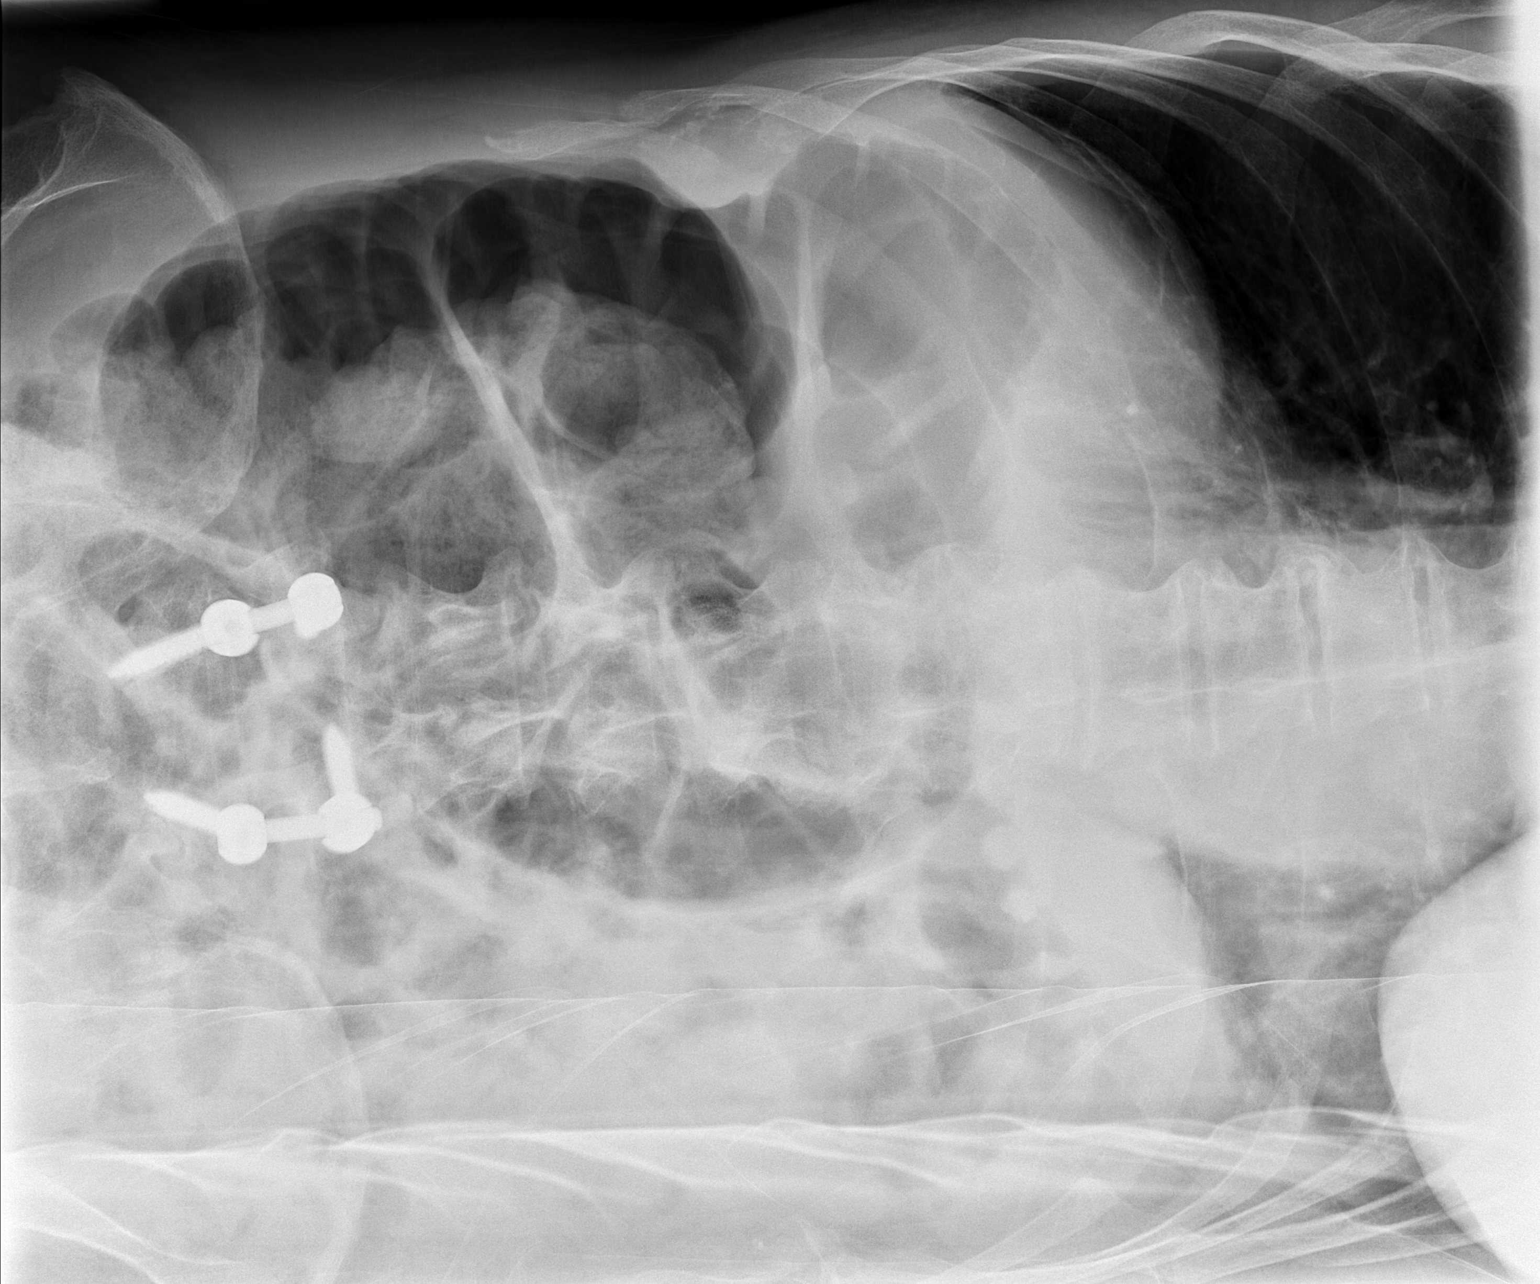

[t abdomen supine]
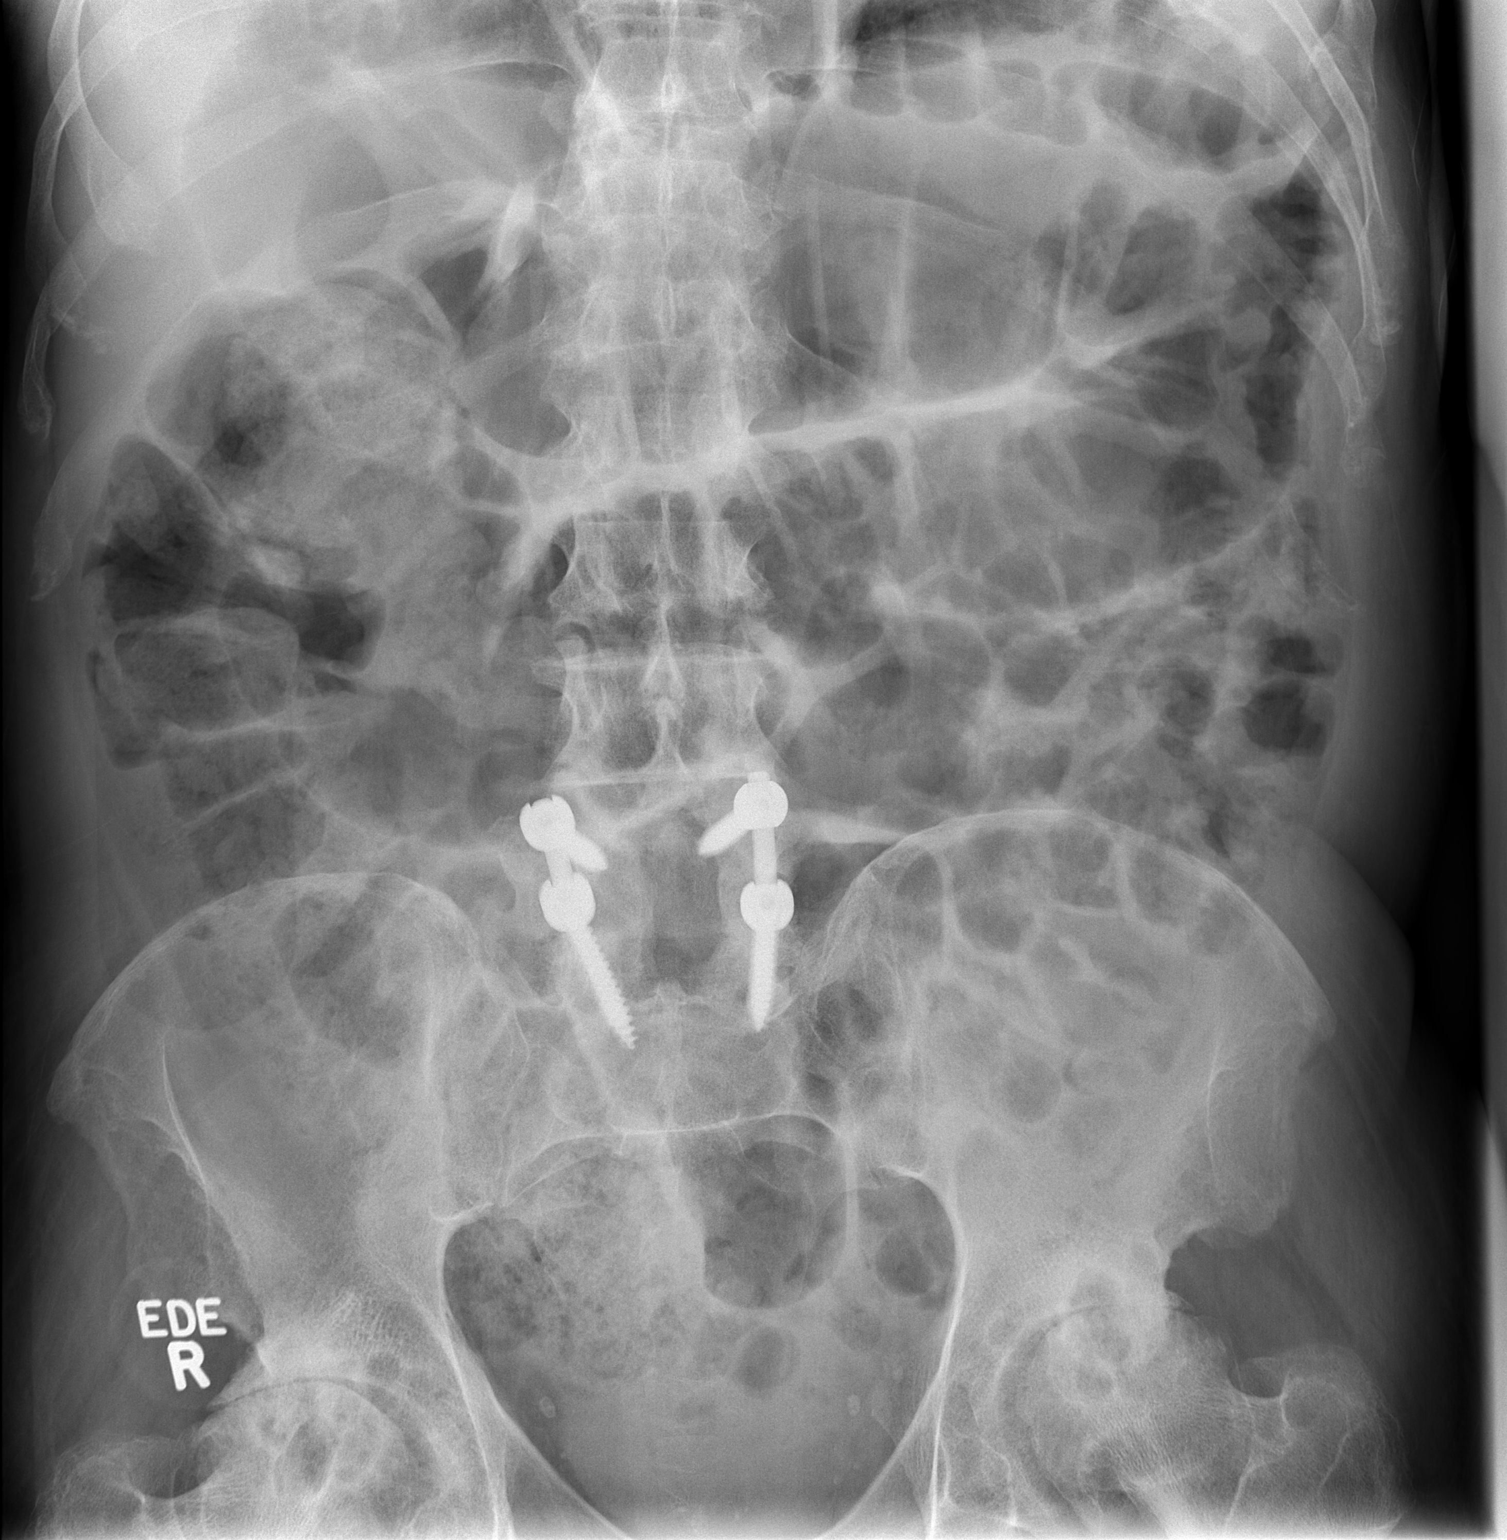

[2 of 2 positions shown; findings below may reference images not displayed]

FINDINGS: Both large and small bowel gas is present most consistent
with diffuse ileus.  No definite bowel obstruction is seen.  No
free air is noted on the left lateral decubitus film of the
abdomen.  Hardware for fusion of the lower lumbar spine is noted
and marked degenerative changes are present in both hips.
IMPRESSION: No change in apparent ileus pattern.  No definite obstruction or
free air.

## 2014-01-17 NOTE — Progress Notes (Signed)
Physical Therapy Treatment Patient Details Name: Joe [name] MRN: [id number] DOB: [birth year] Today's Date: 01/17/2014    History of Present Illness Pt is a 58 y.o. male s/p left THA with a history of mental retardation.    PT Comments    Pt is progressing well towards physical therapy goals, ambulating up to 100 feet with supervision and minimal cueing. Pt has completed stair training (1 step to enter house) and safely demonstrates understanding of correct technique to ascend/descend. Pt will benefit from continued skilled PT in home setting upon d/c. Will follow up with pt in AM to answer any further questions/concerns from family members as they have none at this time.  Follow Up Recommendations  Home health PT;Supervision/Assistance - 24 hour     Equipment Recommendations  3in1 (PT)    Recommendations for Other Services OT consult     Precautions / Restrictions Precautions Precautions: Posterior Hip Restrictions Weight Bearing Restrictions: Yes LLE Weight Bearing: Weight bearing as tolerated    Mobility  Bed Mobility               General bed mobility comments: Pt in recliner upon PT entering room  Transfers Overall transfer level: Needs assistance Equipment used: Rolling walker (2 wheeled) Transfers: Sit to/from Stand Sit to Stand: Supervision         General transfer comment: Supervision for safety, with verbal cues for hand placement and to scoot forward in chair.  Ambulation/Gait Ambulation/Gait assistance: Supervision Ambulation Distance (Feet): 100 Feet Assistive device: Rolling walker (2 wheeled) Gait Pattern/deviations: Step-through pattern;Decreased stride length;Decreased stance time - left;Antalgic Gait velocity: Slowed   General Gait Details: Pt continues to ambulate well;  Supervision for safety. Verbal cues for RW closer to BOS. No loss of balance. Decreased speed but pt is safe and careful with steps.   Stairs             Wheelchair Mobility    Modified Rankin (Stroke Patients Only)       Balance                                    Cognition Arousal/Alertness: Awake/alert Behavior During Therapy: WFL for tasks assessed/performed Overall Cognitive Status: History of cognitive impairments - at baseline                      Exercises Total Joint Exercises Ankle Circles/Pumps: AROM;Both;10 reps;Seated Hip ABduction/ADduction: AROM;Left;10 reps;Supine Long Arc Quad: AROM;Both;10 reps;Seated    General Comments General comments (skin integrity, edema, etc.): Pt educated again on posterior hip precautions. Reviewed exercises and safe stair navilgation. Mother and aunt were present and participated in discussion during therapy      Pertinent Vitals/Pain Pt states his pain in low at current. Complains of hiccoughs Nurse notified Pt positioned in chair for comfort.     Home Living                      Prior Function            PT Goals (current goals can now be found in the care plan section) Acute Rehab PT Goals Patient Stated Goal: No goal stated by pt PT Goal Formulation: Patient unable to participate in goal setting Time For Goal Achievement: 01/24/14 Potential to Achieve Goals: Good Progress towards PT goals: Progressing toward goals    Frequency  7X/week    PT  Plan Current plan remains appropriate    Co-evaluation             End of Session Equipment Utilized During Treatment: Gait belt Activity Tolerance: Patient tolerated treatment well Patient left: in chair;with call bell/phone within reach;with family/visitor present     Time: 1323-1350 PT Time Calculation (min): 27 min  Charges:  $Gait Training: 8-22 mins $Therapeutic Exercise: 8-22 mins                    G Codes:      [employer], [place] 01/17/2014, 2:05 PM

## 2014-01-17 NOTE — Care Management Note (Signed)
CARE MANAGEMENT NOTE 01/17/2014  Patient:  Joe [name], Joe [name]   Account Number:  [id number]  Date Initiated:  01/17/2014  Documentation initiated by:  Ricki Miller  Subjective/Objective Assessment:   58 yr old male s/p left total hip arthroplasty.     Action/Plan:   Case manager spoke with patient's mom concerning home health and DME needs at discharge. Preoperatively setup with [facility], no changes. Patient has rolling walker and 3in1. Family cares for him at home.   Anticipated DC Date:  01/18/2014   Anticipated DC Plan:  [place]  CM consult      [facility] Choice  HOME HEALTH   Choice offered to / List presented to:  C-6 Parent   DME arranged  NA        [place] arranged  HH-2 PT      [place].   Status of service:  Completed, signed off Medicare Important Message given?   (If response is "NO", the following Medicare IM given date fields will be blank) Date Medicare IM given:   Date Additional Medicare IM given:    Discharge Disposition:  [place]

## 2014-01-17 NOTE — Plan of Care (Signed)
Problem: Consults Goal: Diagnosis- Total Joint Replacement Primary Total Hip     

## 2014-01-17 NOTE — Progress Notes (Signed)
Occupational Therapy Evaluation and Discharge Patient Details Name: ANAY RATHE MRN: [id number] DOB: [birth year] Today's Date: 01/17/2014    History of Present Illness Pt is a 58 y.o male s/p L THA with hx of Intellectual Disability and speech impairment.   Clinical Impression   PTA pt lived at home with his mother and was Independent in ADLs and mobility. Pt unable to recall total hip precautions and pt and mother educated on 3/3 posterior hip precautions incorporating precautions into ADLs. Education and training provided for compensatory techniques for LB ADLs. Pt declined opportunity to practice shower transfer. Educated and demonstrated safe shower transfer with use of RW and BSC and provided handout to pt. No further acute OT needs at this time. Acute OT to sign off.     Follow Up Recommendations  No OT follow up;Supervision/Assistance - 24 hour    Equipment Recommendations  None recommended by OT       Precautions / Restrictions Precautions Precautions: Posterior Hip Precaution Booklet Issued: Yes (comment) Precaution Comments: Pt unable to recall precautions. Educated pt on 3/3 precautions and incorporating THP into ADLs. Restrictions Weight Bearing Restrictions: Yes LLE Weight Bearing: Weight bearing as tolerated      Mobility                General bed mobility comments: Pt in recliner for duration of OT session.                   ADL Overall ADL's : Needs assistance/impaired Eating/Feeding: Independent;Sitting   Grooming: Set up;Sitting   Upper Body Bathing: Set up;Sitting   Lower Body Bathing: Minimal assistance;Sit to/from stand   Upper Body Dressing : Set up;Sitting   Lower Body Dressing: Moderate assistance;Sit to/from stand                 General ADL Comments: Pt declined OOB activities due to lethargy. Educated and demonstrated shower transfer with use of RW and BSC.                Pertinent Vitals/Pain No c/o pain      Hand Dominance Right   Extremity/Trunk Assessment Upper Extremity Assessment Upper Extremity Assessment: Overall WFL for tasks assessed   Lower Extremity Assessment Lower Extremity Assessment: Defer to PT evaluation       Communication Communication Communication: Expressive difficulties (Hx of Intellectual Disability)   Cognition Arousal/Alertness: Awake/alert Behavior During Therapy: WFL for tasks assessed/performed Overall Cognitive Status: History of cognitive impairments - at baseline                                Home Living Family/patient expects to be discharged to:: Private residence Living Arrangements: Parent Available Help at Discharge: Available 24 hours/day;Family Type of Home: House Home Access: Stairs to enter [employer] of Steps: 2-3 Entrance Stairs-Rails: Right Home Layout: Two level;Able to live on main level with bedroom/bathroom     Bathroom Shower/Tub: Occupational psychologist: Standard     Home Equipment: Environmental consultant - 2 wheels;Toilet riser;Tub bench          Prior Functioning/Environment Level of Independence: Needs assistance        Comments: Assistance for cognitive deficits. Did not need physical assist                              End of Session  Activity  Tolerance: Patient limited by lethargy (pt in recliner for duration of session) Patient left: in chair;with call bell/phone within reach;with family/visitor present   Time: 5188-4166 OT Time Calculation (min): 24 min Charges:  OT General Charges $OT Visit: 1 Procedure OT Evaluation $Initial OT Evaluation Tier I: 1 Procedure OT Treatments $Self Care/Home Management : 8-22 mins  Juluis Rainier [phone] 01/17/2014, 3:23 PM

## 2014-01-17 NOTE — Evaluation (Signed)
Physical Therapy Evaluation Patient Details Name: Joe [name] MRN: [id number] DOB: [birth year] Today's Date: 01/17/2014   History of Present Illness  Pt is a 58 y.o. male s/p left THA with a history of mental retardation.  Clinical Impression  Patient is s/p left THA surgery resulting in functional limitations due to the deficits listed below (see PT Problem List). Pt is ambulating up to 80 feet with Min guard for safety. Patient will benefit from skilled PT to increase their independence and safety with mobility to allow discharge to the venue listed below. PT will continue to follow pt until d/c focusing on gait, stair training, and independence with functional mobility.     Follow Up Recommendations Home health PT;Supervision/Assistance - 24 hour    Equipment Recommendations  3in1 (PT)    Recommendations for Other Services OT consult     Precautions / Restrictions Precautions Precautions: Posterior Hip Precaution Booklet Issued: Yes (comment) Precaution Comments: Reviewed Restrictions Weight Bearing Restrictions: Yes LLE Weight Bearing: Weight bearing as tolerated      Mobility  Bed Mobility               General bed mobility comments: Pt was at EOB when PT entered  Transfers Overall transfer level: Needs assistance Equipment used: Rolling walker (2 wheeled) Transfers: Sit to/from Stand Sit to Stand: Supervision         General transfer comment: Supervision for safety, with verbal cues for hand placement. Pt did not need physical assist to stand from lowest bed setting  Ambulation/Gait Ambulation/Gait assistance: Min guard Ambulation Distance (Feet): 80 Feet Assistive device: Rolling walker (2 wheeled) Gait Pattern/deviations: Step-through pattern;Decreased stance time - left;Antalgic Gait velocity: Slowed   General Gait Details: Pt ambulating generally well; min guard for safety. Needed verbal cues for sequencing and RW placement initially however  progressed quickly during therapy session and showed good control of RW during gait.   Stairs            Wheelchair Mobility    Modified Rankin (Stroke Patients Only)       Balance Overall balance assessment: Modified Independent Sitting-balance support: No upper extremity supported Sitting balance-Leahy Scale: Good Sitting balance - Comments: Pt able to shift weight to limits of stability while seated without UE support   Standing balance support: No upper extremity supported Standing balance-Leahy Scale: Fair Standing balance comment: Pt able to stand without RW while urinating into urinal                             Pertinent Vitals/Pain Pt reports pain as "mild" states he does not care for medication right now Pt repositioned in chair for comfort    Home Living Family/patient expects to be discharged to:: Private residence Living Arrangements: Parent Available Help at Discharge: Available 24 hours/day;Family Type of Home: House Home Access: Stairs to enter Entrance Stairs-Rails: Right Entrance Stairs-Number of Steps: 2-3 Home Layout: Two level;Able to live on main level with bedroom/bathroom Home Equipment: Gilford Rile - 2 wheels;Toilet riser;Tub bench      Prior Function Level of Independence: Needs assistance         Comments: Assistance for cognitive deficits. Did not need physical assist     Hand Dominance        Extremity/Trunk Assessment   Upper Extremity Assessment: Overall WFL for tasks assessed           Lower Extremity Assessment: LLE deficits/detail   LLE Deficits /  Details: Generalized weakness from surgery.     Communication   Communication: Expressive difficulties;Other (comment) (Hx of mental retardation)  Cognition Arousal/Alertness: Awake/alert Behavior During Therapy: WFL for tasks assessed/performed Overall Cognitive Status: History of cognitive impairments - at baseline                      General  Comments General comments (skin integrity, edema, etc.): Pt educated extensively on posterior hip precautions for safety.    Exercises        Assessment/Plan    PT Assessment Patient needs continued PT services  PT Diagnosis Difficulty walking;Abnormality of gait;Acute pain   PT Problem List Decreased strength;Decreased range of motion;Decreased activity tolerance;Decreased balance;Decreased mobility;Decreased cognition;Decreased knowledge of use of DME;Decreased safety awareness;Decreased knowledge of precautions;Pain  PT Treatment Interventions DME instruction;Gait training;Stair training;Functional mobility training;Therapeutic activities;Therapeutic exercise;Balance training;Neuromuscular re-education;Patient/family education;Modalities   PT Goals (Current goals can be found in the Care Plan section) Acute Rehab PT Goals Patient Stated Goal: No goal stated by pt PT Goal Formulation: Patient unable to participate in goal setting Time For Goal Achievement: 01/24/14 Potential to Achieve Goals: Good    Frequency 7X/week   Barriers to discharge        Co-evaluation               End of Session Equipment Utilized During Treatment: Gait belt Activity Tolerance: Patient tolerated treatment well Patient left: in chair;with call bell/phone within reach;with family/visitor present Nurse Communication: Mobility status         Time: 5397-6734 PT Time Calculation (min): 26 min   Charges:   PT Evaluation $Initial PT Evaluation Tier I: 1 Procedure PT Treatments $Gait Training: 8-22 mins   PT G CodesCamille Bal [place], [place] 01/17/2014, 11:36 AM

## 2014-01-17 NOTE — Progress Notes (Signed)
Patient ID: Joe [name], male   DOB: 1956/08/30, 58 y.o.   MRN: [id number]     Subjective:  Patient reports pain as mild to moderate.  Patient and his family report that he feels better when he is able to move around. Bladder working today.  Bowels not active.  Objective:   VITALS:   Filed Vitals:   01/16/14 2156 01/16/14 2358 01/17/14 0020 01/17/14 0609  BP: 118/64  124/64 108/64  Pulse: 62  62 81  Temp: 97.9 F (36.6 C)  98.2 F (36.8 C) 97.8 F (36.6 C)  TempSrc: Oral  Oral Oral  Resp: 18 16 18 18   Weight:      SpO2: 100% 100% 93% 100%    ABD soft Sensation intact distally Dorsiflexion/Plantar flexion intact Incision: moderate drainage EHL FHL firing   Lab Results  Component Value Date   WBC 9.6 01/17/2014   HGB 13.5 01/17/2014   HCT 38.5* 01/17/2014   MCV 99.7 01/17/2014   PLT 238 01/17/2014     Assessment/Plan: 1 Day Post-Op   Principal Problem:   Osteoarthritis of left hip Active Problems:   Hip arthritis   Advance diet Up with therapy WBAT Plan dressing tomorrow  Plan DC home tomorrow    Joya Gaskins 01/17/2014, 7:55 AM  Seen and agree.  Marchia Bond, MD Cell [phone]

## 2014-01-18 ENCOUNTER — Encounter (HOSPITAL_COMMUNITY): Payer: Self-pay | Admitting: Orthopedic Surgery

## 2014-01-18 DIAGNOSIS — E871 Hypo-osmolality and hyponatremia: Secondary | ICD-10-CM | POA: Diagnosis not present

## 2014-01-18 LAB — BASIC METABOLIC PANEL
BUN: 27 mg/dL — AB (ref 6–23)
CO2: 23 mEq/L (ref 19–32)
Calcium: 8.7 mg/dL (ref 8.4–10.5)
Chloride: 95 mEq/L — ABNORMAL LOW (ref 96–112)
Creatinine, Ser: 0.71 mg/dL (ref 0.50–1.35)
GFR calc Af Amer: >90 mL/min (ref >90)
GLUCOSE: 110 mg/dL — AB (ref 70–99)
Potassium: 4.9 mEq/L (ref 3.7–5.3)
SODIUM: 130 meq/L — AB (ref 137–147)

## 2014-01-18 LAB — CBC
HEMATOCRIT: 31 % — AB (ref 39.0–52.0)
HEMOGLOBIN: 11.1 g/dL — AB (ref 13.0–17.0)
MCH: 35 pg — ABNORMAL HIGH (ref 26.0–34.0)
MCHC: 35.8 g/dL (ref 30.0–36.0)
MCV: 97.8 fL (ref 78.0–100.0)
Platelets: 204 10*3/uL (ref 150–400)
RBC: 3.17 MIL/uL — ABNORMAL LOW (ref 4.22–5.81)
RDW: 12.5 % (ref 11.5–15.5)
WBC: 13.8 10*3/uL — ABNORMAL HIGH (ref 4.0–10.5)

## 2014-01-18 NOTE — Clinical Documentation Improvement (Signed)
Possible Clinical Conditions?  _______Hyponatremia _______Other Condition _______Cannot Clinically Determine     Diagnostics: Sodium: 4/09: 130 4/08: 135 Treatment: 0.45% NaCl w/80meq kcl @ 75cc/hr  Thank Dennis Bast Theron Arista, Clinical Documentation Specialist:  [facility]

## 2014-01-18 NOTE — Progress Notes (Signed)
Patient ID: Joe [name], male   DOB: 08/07/1956, 58 y.o.   MRN: [id number]     Subjective:  Patient reports pain as mild.  Patient states that he feels much better and would like to go home.  Objective:   VITALS:   Filed Vitals:   01/18/14 0210 01/18/14 0400 01/18/14 0524 01/18/14 0538  BP:    99/57  Pulse:    72  Temp: 98.8 F (37.1 C)   98.4 F (36.9 C)  TempSrc: Oral   Oral  Resp:  18  16  Height:   5\' 8"  (1.727 m)   Weight:   66.679 kg (147 lb)   SpO2:  97%  98%    ABD soft Sensation intact distally Dorsiflexion/Plantar flexion intact Incision: dressing C/D/I and no drainage Wound clean and dry no sign of infection  Lab Results  Component Value Date   WBC 9.6 01/17/2014   HGB 13.5 01/17/2014   HCT 38.5* 01/17/2014   MCV 99.7 01/17/2014   PLT 238 01/17/2014     Assessment/Plan: 2 Days Post-Op   Principal Problem:   Osteoarthritis of left hip Active Problems:   Hip arthritis   Advance diet Up with therapy Discharge home with home health WBAT dry dressing PRN Mild hyponatremia, observe.     Joya Gaskins 01/18/2014, 7:40 AM   Marchia Bond, MD Cell [phone]

## 2014-01-18 NOTE — Progress Notes (Signed)
Pt provided with discharge instructions and follow up information. He is going home with HHPT provided by [facility]. Equipment has been provided by the family and education on Lovenox injections has been provided. He is going home at this time in private vehicle.

## 2014-01-18 NOTE — Progress Notes (Signed)
Physical Therapy Treatment Patient Details Name: Joe [name] MRN: [id number] DOB: 27-Sep-1956 Today's Date: 01/18/2014    History of Present Illness Pt is a 58 y.o. male s/p left THA with a history of mental retardation.    PT Comments    Pt continues to progress well towards physical therapy goals, ambulating up to 150 feet with supervision. Pt and his mother have been educated on posterior hip precautions and exercises. Gait training completed yesterday and pt states this AM that he feels comfortable with ascending/descending single step similar to home environment. Pt will benefit from continued skilled PT in home setting to improve independence with functional mobility. Pt is safe for d/c to home from PT standpoint.  Follow Up Recommendations  Home health PT;Supervision/Assistance - 24 hour     Equipment Recommendations  3in1 (PT)    Recommendations for Other Services OT consult     Precautions / Restrictions Precautions Precautions: Posterior Hip Precaution Booklet Issued: Yes (comment) Precaution Comments: Reviewed Restrictions Weight Bearing Restrictions: Yes LLE Weight Bearing: Weight bearing as tolerated    Mobility  Bed Mobility               General bed mobility comments: Pt in recliining chair when PT session began  Transfers Overall transfer level: Needs assistance Equipment used: Rolling walker (2 wheeled) Transfers: Sit to/from Stand Sit to Stand: Supervision         General transfer comment: Supervision for safety, with verbal cues for hand placement. Pt did not need physical assist to stand from recliner. Requires extra time.  Ambulation/Gait Ambulation/Gait assistance: Supervision Ambulation Distance (Feet): 150 Feet Assistive device: Rolling walker (2 wheeled) Gait Pattern/deviations: Step-through pattern;Decreased stance time - left;Antalgic     General Gait Details: Pt continues to progress with ambulatory distance. Supervision for  safety, no loss of balance. Cues for upright posture and forward gaze intermittently.   Stairs            Wheelchair Mobility    Modified Rankin (Stroke Patients Only)       Balance                                    Cognition Arousal/Alertness: Awake/alert Behavior During Therapy: WFL for tasks assessed/performed Overall Cognitive Status: History of cognitive impairments - at baseline                      Exercises Total Joint Exercises Ankle Circles/Pumps: AROM;Both;10 reps;Seated    General Comments General comments (skin integrity, edema, etc.): Pt and mother educated on posterior hip precautions and exercises. they have no further questions      Pertinent Vitals/Pain Pt verbalizes that he is "sore" this AM. Mother states nursing has just administered medications Pt repositioned in chair for comfort.    Home Living                      Prior Function            PT Goals (current goals can now be found in the care plan section) Acute Rehab PT Goals Patient Stated Goal: No goal stated by pt PT Goal Formulation: Patient unable to participate in goal setting Time For Goal Achievement: 01/24/14 Potential to Achieve Goals: Good Progress towards PT goals: Progressing toward goals    Frequency  7X/week    PT Plan Current plan remains appropriate  Co-evaluation             End of Session Equipment Utilized During Treatment: Gait belt Activity Tolerance: Patient tolerated treatment well Patient left: in chair;with call bell/phone within reach;with family/visitor present     Time: 0350-0938 PT Time Calculation (min): 11 min  Charges:  $Gait Training: 8-22 mins                    G Codes:      Joe [name], Joe [name] 01/18/2014, 9:45 AM

## 2014-01-18 NOTE — Discharge Summary (Signed)
Physician Discharge Summary  Patient ID: KOURTLAND COOPMAN MRN: [id number] DOB/AGE: [birth year] 58 y.o.  Admit date: 01/16/2014 Discharge date: 01/18/2014  Admission Diagnoses:  Osteoarthritis of left hip  Discharge Diagnoses:  Principal Problem:   Osteoarthritis of left hip Active Problems:   Hyponatremia   Past Medical History  Diagnosis Date  . Right ureteral stone   . Cognitive deficits   . Anxiety     over surgery  . 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 Expressive speech delay   . Arthritis     BACK AND SHOULDER  . Congenital brain damage   . Speech impediment   . Umbilical hernia   . Complication of anesthesia   . Constipation   . Osteoarthritis of left hip 01/16/2014    Surgeries: Procedure(s): TOTAL HIP ARTHROPLASTY on 01/16/2014   Consultants (if any):    Discharged Condition: Improved  Hospital Course: LUISALBERTO BEEGLE is an 58 y.o. male who was admitted 01/16/2014 with a diagnosis of Osteoarthritis of left hip and went to the operating room on 01/16/2014 and underwent the above named procedures.    He was given perioperative antibiotics:  Anti-infectives   Start     Dose/Rate Route Frequency Ordered Stop   01/16/14 1700  ceFAZolin (ANCEF) IVPB 2 g/50 mL premix     2 g 100 mL/hr over 30 Minutes Intravenous Every 6 hours 01/16/14 1520 01/16/14 2350   01/16/14 0600  ceFAZolin (ANCEF) IVPB 2 g/50 mL premix     2 g 100 mL/hr over 30 Minutes Intravenous On call to O.R. 01/15/14 1358 01/16/14 1102    .  He was given sequential compression devices, early ambulation, and lovenox for DVT prophylaxis.  He benefited maximally from the hospital stay and there were no complications.    Recent vital signs:  Filed Vitals:   01/18/14 0538  BP: 99/57  Pulse: 72  Temp: 98.4 F (36.9 C)  Resp: 16    Recent laboratory studies:  Lab Results  Component Value Date   HGB 11.1* 01/18/2014   HGB 13.5 01/17/2014   HGB 12.2*  01/16/2014   Lab Results  Component Value Date   WBC 13.8* 01/18/2014   PLT 204 01/18/2014   Lab Results  Component Value Date   INR 1.05 01/09/2014   Lab Results  Component Value Date   NA 130* 01/18/2014   K 4.9 01/18/2014   CL 95* 01/18/2014   CO2 23 01/18/2014   BUN 27* 01/18/2014   CREATININE 0.71 01/18/2014   GLUCOSE 110* 01/18/2014    Discharge Medications:     Medication List    STOP taking these medications       naproxen sodium 220 MG tablet  Commonly known as:  ANAPROX      TAKE these medications       diazepam 5 MG tablet  Commonly known as:  VALIUM  Take 1 tablet (5 mg total) by mouth every 6 (six) hours as needed for anxiety.     enoxaparin 40 MG/0.4ML injection  Commonly known as:  LOVENOX  Inject 0.4 mLs (40 mg total) into the skin daily.     methocarbamol 500 MG tablet  Commonly known as:  ROBAXIN  Take 1 tablet (500 mg total) by mouth 4 (four) times daily.     oxyCODONE-acetaminophen 10-325 MG per tablet  Commonly known as:  PERCOCET  Take 1-2 tablets by mouth every 6 (six)  hours as needed for pain. MAXIMUM TOTAL ACETAMINOPHEN DOSE IS 4000 MG PER DAY     promethazine 25 MG tablet  Commonly known as:  PHENERGAN  Take 1 tablet (25 mg total) by mouth every 6 (six) hours as needed for nausea or vomiting.     sennosides-docusate sodium 8.6-50 MG tablet  Commonly known as:  SENOKOT-S  Take 2 tablets by mouth daily.     sennosides-docusate sodium 8.6-50 MG tablet  Commonly known as:  SENOKOT-S  Take 1 tablet by mouth daily as needed for constipation.     traMADol 50 MG tablet  Commonly known as:  ULTRAM  Take 25 mg by mouth 2 (two) times daily as needed for moderate pain.     VITAMIN C PO  Take 500 mg by mouth daily.        Diagnostic Studies: Dg Pelvis Portable  01/16/2014   CLINICAL DATA:  Postop left hip arthroplasty.  EXAM: PORTABLE PELVIS 1-2 VIEWS  COMPARISON:  CT, 03/24/2013  FINDINGS: Left hip arthroplasty is well-seated with the acetabular and  femoral components well aligned. No acute fracture or evidence of an operative complication. There are advanced arthropathic changes of the right hip.  IMPRESSION: Well aligned left hip prosthesis.   Electronically Signed   By: Lajean Manes M.D.   On: 01/16/2014 14:33   Dg Hip Portable 1 View Left  01/16/2014   CLINICAL DATA:  Hip replacement.  EXAM: PORTABLE LEFT HIP - 1 VIEW  COMPARISON:  None.  FINDINGS: Left total hip arthroplasty is in place. The device is located. There is no fracture.  IMPRESSION: Left hip replacement without complication.   Electronically Signed   By: Inge Rise M.D.   On: 01/16/2014 14:44    Disposition: 01-Home or Self Care      Discharge Orders   Future Orders Complete By Expires   Posterior total hip precautions  As directed    Weight bearing as tolerated  As directed    Questions:     Laterality:     Extremity:        Follow-up Information   Follow up with Johnny Bridge, MD. Schedule an appointment as soon as possible for a visit in 2 weeks.   Specialty:  Orthopedic Surgery   Contact information:   [place] [id number] [phone]       Follow up with [facility]. (Someone from [facility] will contact you concerning physical therapy start date and time.)    Contact information:   [address] [place] [address] [phone]        Signed: Johnny Bridge 01/18/2014, 11:56 AM

## 2014-02-20 ENCOUNTER — Encounter (HOSPITAL_BASED_OUTPATIENT_CLINIC_OR_DEPARTMENT_OTHER): Payer: Medicare Other | Attending: General Surgery

## 2014-04-21 ENCOUNTER — Other Ambulatory Visit: Payer: Self-pay | Admitting: Orthopedic Surgery

## 2014-05-02 ENCOUNTER — Encounter (HOSPITAL_COMMUNITY): Payer: Self-pay | Admitting: Pharmacy Technician

## 2014-05-03 NOTE — Pre-Procedure Instructions (Signed)
[facility] - Preparing for Surgery  Before surgery, you can play an important role.  Because skin is not sterile, your skin needs to be as free of germs as possible.  You can reduce the number of germs on you skin by washing with CHG (chlorahexidine gluconate) soap before surgery.  CHG is an antiseptic cleaner which kills germs and bonds with the skin to continue killing germs even after washing.  Please DO NOT use if you have an allergy to CHG or antibacterial soaps.  If your skin becomes reddened/irritated stop using the CHG and inform your nurse when you arrive at Short Stay.  Do not shave (including legs and underarms) for at least 48 hours prior to the first CHG shower.  You may shave your face.  Please follow these instructions carefully:   1.  Shower with CHG Soap the night before surgery and the morning of Surgery.  2.  If you choose to wash your hair, wash your hair first as usual with your normal shampoo.  3.  After you shampoo, rinse your hair and body thoroughly to remove the shampoo.  4.  Use CHG as you would any other liquid soap.  You can apply CHG directly to the skin and wash gently with scrungie or a clean washcloth.  5.  Apply the CHG Soap to your body ONLY FROM THE NECK DOWN.  Do not use on open wounds or open sores.  Avoid contact with your eyes, ears, mouth and genitals (private parts).  Wash genitals (private parts) with your normal soap.  6.  Wash thoroughly, paying special attention to the area where your surgery will be performed.  7.  Thoroughly rinse your body with warm water from the neck down.  8.  DO NOT shower/wash with your normal soap after using and rinsing off the CHG Soap.  9.  Pat yourself dry with a clean towel.            10.  Wear clean pajamas.            11.  Place clean sheets on your bed the night of your first shower and do not sleep with pets.  Day of Surgery  Do not apply any lotions the morning of surgery.  Please wear clean clothes to the  hospital/surgery center.   

## 2014-05-03 NOTE — Pre-Procedure Instructions (Signed)
Taino Maertens Martinson  05/03/2014   Your procedure is scheduled on:  August 4  Report to [facility] Admitting at 05:30 AM.  Call this number if you have problems the morning of surgery: [phone]   Remember:   Do not eat food or drink liquids after midnight.   Take these medicines the morning of surgery with A SIP OF WATER: Tylenol OR Tramadol (if needed),    STOP/ Do not take Aspirin, Aleve, Naproxen, Advil, Ibuprofen, Motrin, Vitamins, Herbs, or Supplements starting today   Do not wear jewelry, make-up or nail polish.  Do not wear lotions, powders, or perfumes. You may wear deodorant.  Do not shave 48 hours prior to surgery. Men may shave face and neck.  Do not bring valuables to the hospital.  [facility] is not responsible for any belongings or valuables.               Contacts, dentures or bridgework may not be worn into surgery.  Leave suitcase in the car. After surgery it may be brought to your room.  For patients admitted to the hospital, discharge time is determined by your treatment team.               Special Instructions: See [facility] Preparing For Surgery   Please read over the following fact sheets that you were given: Pain Booklet, Coughing and Deep Breathing, Blood Transfusion Information, Total Joint Packet and Surgical Site Infection Prevention

## 2014-05-04 ENCOUNTER — Encounter (HOSPITAL_COMMUNITY): Payer: Self-pay

## 2014-05-04 ENCOUNTER — Encounter (HOSPITAL_COMMUNITY)
Admission: RE | Admit: 2014-05-04 | Discharge: 2014-05-04 | Disposition: A | Discharge status: Home / Self Care | Payer: Medicare Other | Source: Ambulatory Visit | Attending: Orthopedic Surgery | Admitting: Orthopedic Surgery

## 2014-05-04 DIAGNOSIS — Z01812 Encounter for preprocedural laboratory examination: Secondary | ICD-10-CM | POA: Diagnosis present

## 2014-05-04 DIAGNOSIS — Z01818 Encounter for other preprocedural examination: Secondary | ICD-10-CM | POA: Insufficient documentation

## 2014-05-04 HISTORY — DX: Personal history of urinary calculi: Z87.442

## 2014-05-04 LAB — CBC
HCT: 40.2 % (ref 39.0–52.0)
Hemoglobin: 13.6 g/dL (ref 13.0–17.0)
MCH: 33.3 pg (ref 26.0–34.0)
MCHC: 33.8 g/dL (ref 30.0–36.0)
MCV: 98.3 fL (ref 78.0–100.0)
Platelets: 229 10*3/uL (ref 150–400)
RBC: 4.09 MIL/uL — ABNORMAL LOW (ref 4.22–5.81)
RDW: 13.8 % (ref 11.5–15.5)
WBC: 6.4 10*3/uL (ref 4.0–10.5)

## 2014-05-04 LAB — BASIC METABOLIC PANEL
Anion gap: 12 (ref 5–15)
BUN: 13 mg/dL (ref 6–23)
CALCIUM: 9.7 mg/dL (ref 8.4–10.5)
CO2: 28 mEq/L (ref 19–32)
Chloride: 103 mEq/L (ref 96–112)
Creatinine, Ser: 0.56 mg/dL (ref 0.50–1.35)
GFR calc Af Amer: >90 mL/min (ref >90)
GLUCOSE: 100 mg/dL — AB (ref 70–99)
Potassium: 4.4 mEq/L (ref 3.7–5.3)
Sodium: 143 mEq/L (ref 137–147)

## 2014-05-04 LAB — TYPE AND SCREEN
ABO/RH(D): A POS
ANTIBODY SCREEN: NEGATIVE

## 2014-05-04 LAB — SURGICAL PCR SCREEN
MRSA, PCR: NEGATIVE
Staphylococcus aureus: NEGATIVE

## 2014-05-04 NOTE — Pre-Procedure Instructions (Signed)
Evert Wenrich Depner  05/04/2014   Your procedure is scheduled on:  Tuesday May 15, 2014 at 7:30 AM.  Report to [facility] Admitting at 5:30 AM.  Call this number if you have problems the morning of surgery: [phone]   Remember:   Do not eat food or drink liquids after midnight.   Take these medicines the morning of surgery with A SIP OF WATER: Tylenol OR Tramadol (if needed),    STOP/ Do not take Aspirin, Aleve, Naproxen, Advil, Ibuprofen, Motrin, Vitamins, Herbs, or Supplements starting today   Do not wear jewelry.  Do not wear lotions, powders, or cologne.   Men may shave face and neck.  Do not bring valuables to the hospital.  [facility] is not responsible for any belongings or valuables.               Contacts, dentures or bridgework may not be worn into surgery.  Leave suitcase in the car. After surgery it may be brought to your room.  For patients admitted to the hospital, discharge time is determined by your treatment team.               Special Instructions: See [facility] Preparing For Surgery   Please read over the following fact sheets that you were given: Pain Booklet, Coughing and Deep Breathing, Blood Transfusion Information, Total Joint Packet and Surgical Site Infection Prevention

## 2014-05-04 NOTE — Progress Notes (Signed)
Patient is very pleasant and had his mother Chalmers Guest at chairside during PAT visit. Patients mother answered all questions regarding past medical and surgical history, however patient was alert and oriented to person, place, and situation. PCP is at [facility].

## 2014-05-14 MED ORDER — CEFAZOLIN SODIUM-DEXTROSE 2-3 GM-% IV SOLR
2.0000 g | INTRAVENOUS | Status: AC
  Administered 2014-05-15: 2 g via INTRAVENOUS
  Filled 2014-05-14: qty 50

## 2014-05-15 ENCOUNTER — Inpatient Hospital Stay (HOSPITAL_COMMUNITY): Payer: Medicare Other

## 2014-05-15 ENCOUNTER — Inpatient Hospital Stay (HOSPITAL_COMMUNITY)
Admission: RE | Admit: 2014-05-15 | Discharge: 2014-05-17 | DRG: 470 | Disposition: A | Discharge status: Home Health | Payer: Medicare Other | Source: Ambulatory Visit | Attending: Orthopedic Surgery | Admitting: Orthopedic Surgery

## 2014-05-15 ENCOUNTER — Encounter (HOSPITAL_COMMUNITY): Payer: Self-pay | Admitting: *Deleted

## 2014-05-15 ENCOUNTER — Encounter (HOSPITAL_COMMUNITY)
Admission: RE | Disposition: A | Discharge status: Home Health | Payer: Self-pay | Source: Ambulatory Visit | Attending: Orthopedic Surgery

## 2014-05-15 ENCOUNTER — Inpatient Hospital Stay (HOSPITAL_COMMUNITY): Payer: Medicare Other | Admitting: Certified Registered Nurse Anesthetist

## 2014-05-15 ENCOUNTER — Encounter (HOSPITAL_COMMUNITY): Payer: Medicare Other | Admitting: Certified Registered Nurse Anesthetist

## 2014-05-15 DIAGNOSIS — F79 Unspecified intellectual disabilities: Secondary | ICD-10-CM | POA: Diagnosis present

## 2014-05-15 DIAGNOSIS — M1610 Unilateral primary osteoarthritis, unspecified hip: Principal | ICD-10-CM | POA: Diagnosis present

## 2014-05-15 DIAGNOSIS — M25559 Pain in unspecified hip: Secondary | ICD-10-CM | POA: Diagnosis present

## 2014-05-15 DIAGNOSIS — M169 Osteoarthritis of hip, unspecified: Secondary | ICD-10-CM | POA: Diagnosis not present

## 2014-05-15 DIAGNOSIS — M1611 Unilateral primary osteoarthritis, right hip: Secondary | ICD-10-CM | POA: Diagnosis present

## 2014-05-15 DIAGNOSIS — Z881 Allergy status to other antibiotic agents status: Secondary | ICD-10-CM

## 2014-05-15 DIAGNOSIS — F8089 Other developmental disorders of speech and language: Secondary | ICD-10-CM | POA: Diagnosis present

## 2014-05-15 DIAGNOSIS — Z96649 Presence of unspecified artificial hip joint: Secondary | ICD-10-CM

## 2014-05-15 HISTORY — DX: Unilateral primary osteoarthritis, right hip: M16.11

## 2014-05-15 HISTORY — PX: TOTAL HIP ARTHROPLASTY: SHX124

## 2014-05-15 SURGERY — ARTHROPLASTY, HIP, TOTAL,POSTERIOR APPROACH
Anesthesia: General | Site: Hip | Laterality: Right

## 2014-05-15 MED ORDER — NEOSTIGMINE METHYLSULFATE 10 MG/10ML IV SOLN
INTRAVENOUS | Status: DC | PRN
  Administered 2014-05-15: 4 mg via INTRAVENOUS

## 2014-05-15 MED ORDER — METOCLOPRAMIDE HCL 10 MG PO TABS: 5.0000 mg | ORAL_TABLET | Freq: Three times a day (TID) | ORAL | Status: DC | PRN

## 2014-05-15 MED ORDER — FENTANYL CITRATE 0.05 MG/ML IJ SOLN
INTRAMUSCULAR | Status: AC
  Filled 2014-05-15: qty 5

## 2014-05-15 MED ORDER — ONDANSETRON HCL 4 MG/2ML IJ SOLN
4.0000 mg | Freq: Four times a day (QID) | INTRAMUSCULAR | Status: DC | PRN
Start: 2014-05-15 — End: 2014-05-17
  Administered 2014-05-16: 4 mg via INTRAVENOUS
  Filled 2014-05-15: qty 2

## 2014-05-15 MED ORDER — HYDROMORPHONE HCL PF 1 MG/ML IJ SOLN
0.2500 mg | INTRAMUSCULAR | Status: DC | PRN
  Administered 2014-05-15: 0.5 mg via INTRAVENOUS

## 2014-05-15 MED ORDER — PHENOL 1.4 % MT LIQD: 1.0000 | OROMUCOSAL | Status: DC | PRN

## 2014-05-15 MED ORDER — SENNA 8.6 MG PO TABS
1.0000 | ORAL_TABLET | Freq: Two times a day (BID) | ORAL | Status: DC
  Administered 2014-05-15 – 2014-05-17 (×5): 8.6 mg via ORAL
  Filled 2014-05-15 (×6): qty 1

## 2014-05-15 MED ORDER — ONDANSETRON HCL 4 MG/2ML IJ SOLN
INTRAMUSCULAR | Status: DC | PRN
  Administered 2014-05-15: 4 mg via INTRAVENOUS

## 2014-05-15 MED ORDER — OXYCODONE HCL 5 MG/5ML PO SOLN: 5.0000 mg | Freq: Once | ORAL | Status: AC | PRN

## 2014-05-15 MED ORDER — CEFAZOLIN SODIUM-DEXTROSE 2-3 GM-% IV SOLR
2.0000 g | Freq: Four times a day (QID) | INTRAVENOUS | Status: AC
  Administered 2014-05-15 (×2): 2 g via INTRAVENOUS
  Filled 2014-05-15 (×2): qty 50

## 2014-05-15 MED ORDER — ROCURONIUM BROMIDE 100 MG/10ML IV SOLN
INTRAVENOUS | Status: DC | PRN
  Administered 2014-05-15: 40 mg via INTRAVENOUS

## 2014-05-15 MED ORDER — MAGNESIUM CITRATE PO SOLN: 1.0000 | Freq: Once | ORAL | Status: AC | PRN

## 2014-05-15 MED ORDER — LIDOCAINE HCL (CARDIAC) 20 MG/ML IV SOLN
INTRAVENOUS | Status: DC | PRN
  Administered 2014-05-15: 80 mg via INTRAVENOUS

## 2014-05-15 MED ORDER — PROPOFOL 10 MG/ML IV BOLUS
INTRAVENOUS | Status: DC | PRN
  Administered 2014-05-15: 160 mg via INTRAVENOUS

## 2014-05-15 MED ORDER — ACETAMINOPHEN 650 MG RE SUPP: 650.0000 mg | Freq: Four times a day (QID) | RECTAL | Status: DC | PRN

## 2014-05-15 MED ORDER — ONDANSETRON HCL 4 MG PO TABS: 4.0000 mg | ORAL_TABLET | Freq: Four times a day (QID) | ORAL | Status: DC | PRN

## 2014-05-15 MED ORDER — RIVAROXABAN 10 MG PO TABS
10.0000 mg | ORAL_TABLET | Freq: Every day | ORAL | Status: DC
  Administered 2014-05-16 – 2014-05-17 (×2): 10 mg via ORAL
  Filled 2014-05-15 (×3): qty 1

## 2014-05-15 MED ORDER — STERILE WATER FOR INJECTION IJ SOLN
INTRAMUSCULAR | Status: AC
  Filled 2014-05-15: qty 10

## 2014-05-15 MED ORDER — OXYCODONE HCL 5 MG PO TABS
ORAL_TABLET | ORAL | Status: AC
  Filled 2014-05-15: qty 1

## 2014-05-15 MED ORDER — ALUM & MAG HYDROXIDE-SIMETH 200-200-20 MG/5ML PO SUSP: 30.0000 mL | ORAL | Status: DC | PRN

## 2014-05-15 MED ORDER — METHOCARBAMOL 500 MG PO TABS: 500.0000 mg | ORAL_TABLET | Freq: Four times a day (QID) | ORAL | Status: DC

## 2014-05-15 MED ORDER — PHENYLEPHRINE HCL 10 MG/ML IJ SOLN
INTRAMUSCULAR | Status: DC | PRN
  Administered 2014-05-15 (×2): 80 ug via INTRAVENOUS
  Administered 2014-05-15: 40 ug via INTRAVENOUS
  Administered 2014-05-15 (×3): 80 ug via INTRAVENOUS

## 2014-05-15 MED ORDER — ACETAMINOPHEN 325 MG PO TABS: 650.0000 mg | ORAL_TABLET | Freq: Four times a day (QID) | ORAL | Status: DC | PRN

## 2014-05-15 MED ORDER — METHOCARBAMOL 1000 MG/10ML IJ SOLN
500.0000 mg | Freq: Four times a day (QID) | INTRAMUSCULAR | Status: DC | PRN
  Filled 2014-05-15: qty 5

## 2014-05-15 MED ORDER — EPHEDRINE SULFATE 50 MG/ML IJ SOLN
INTRAMUSCULAR | Status: AC
  Filled 2014-05-15: qty 1

## 2014-05-15 MED ORDER — MEPERIDINE HCL 25 MG/ML IJ SOLN: 6.2500 mg | INTRAMUSCULAR | Status: DC | PRN

## 2014-05-15 MED ORDER — VITAMIN C 500 MG PO TABS
500.0000 mg | ORAL_TABLET | Freq: Every day | ORAL | Status: DC
  Administered 2014-05-15 – 2014-05-17 (×3): 500 mg via ORAL
  Filled 2014-05-15 (×3): qty 1

## 2014-05-15 MED ORDER — OXYCODONE HCL 5 MG PO TABS
5.0000 mg | ORAL_TABLET | ORAL | Status: DC | PRN
  Administered 2014-05-15 (×2): 5 mg via ORAL
  Filled 2014-05-15: qty 1
  Filled 2014-05-15: qty 2

## 2014-05-15 MED ORDER — KETOROLAC TROMETHAMINE 15 MG/ML IJ SOLN
INTRAMUSCULAR | Status: AC
  Filled 2014-05-15: qty 1

## 2014-05-15 MED ORDER — SENNA-DOCUSATE SODIUM 8.6-50 MG PO TABS: 2.0000 | ORAL_TABLET | Freq: Every day | ORAL | Status: DC

## 2014-05-15 MED ORDER — TRAMADOL HCL 50 MG PO TABS
50.0000 mg | ORAL_TABLET | Freq: Four times a day (QID) | ORAL | Status: DC | PRN
  Administered 2014-05-16 – 2014-05-17 (×3): 50 mg via ORAL
  Filled 2014-05-15 (×3): qty 1

## 2014-05-15 MED ORDER — DEXAMETHASONE SODIUM PHOSPHATE 10 MG/ML IJ SOLN
10.0000 mg | Freq: Three times a day (TID) | INTRAMUSCULAR | Status: AC
  Filled 2014-05-15 (×3): qty 1

## 2014-05-15 MED ORDER — OXYCODONE-ACETAMINOPHEN 10-325 MG PO TABS: 1.0000 | ORAL_TABLET | Freq: Four times a day (QID) | ORAL | Status: DC | PRN

## 2014-05-15 MED ORDER — ONDANSETRON HCL 4 MG/2ML IJ SOLN: 4.0000 mg | Freq: Once | INTRAMUSCULAR | Status: DC | PRN

## 2014-05-15 MED ORDER — FENTANYL CITRATE 0.05 MG/ML IJ SOLN
INTRAMUSCULAR | Status: DC | PRN
  Administered 2014-05-15 (×4): 50 ug via INTRAVENOUS
  Administered 2014-05-15: 100 ug via INTRAVENOUS

## 2014-05-15 MED ORDER — POTASSIUM CHLORIDE IN NACL 20-0.45 MEQ/L-% IV SOLN
INTRAVENOUS | Status: DC
  Administered 2014-05-15 – 2014-05-16 (×2): via INTRAVENOUS
  Filled 2014-05-15 (×5): qty 1000

## 2014-05-15 MED ORDER — SODIUM CHLORIDE 0.9 % IR SOLN
Status: DC | PRN
  Administered 2014-05-15: 1000 mL

## 2014-05-15 MED ORDER — BUPIVACAINE HCL (PF) 0.25 % IJ SOLN
INTRAMUSCULAR | Status: AC
  Filled 2014-05-15: qty 30

## 2014-05-15 MED ORDER — POLYETHYLENE GLYCOL 3350 17 G PO PACK: 17.0000 g | PACK | Freq: Every day | ORAL | Status: DC | PRN

## 2014-05-15 MED ORDER — METHOCARBAMOL 500 MG PO TABS
500.0000 mg | ORAL_TABLET | Freq: Four times a day (QID) | ORAL | Status: DC | PRN
  Administered 2014-05-15 – 2014-05-17 (×3): 500 mg via ORAL
  Filled 2014-05-15 (×2): qty 1

## 2014-05-15 MED ORDER — PROPOFOL 10 MG/ML IV BOLUS
INTRAVENOUS | Status: AC
  Filled 2014-05-15: qty 20

## 2014-05-15 MED ORDER — HYDROMORPHONE HCL PF 1 MG/ML IJ SOLN
0.5000 mg | INTRAMUSCULAR | Status: DC | PRN
Start: 2014-05-15 — End: 2014-05-17

## 2014-05-15 MED ORDER — BUPIVACAINE HCL (PF) 0.25 % IJ SOLN
INTRAMUSCULAR | Status: DC | PRN
  Administered 2014-05-15: 12 mL

## 2014-05-15 MED ORDER — BISACODYL 10 MG RE SUPP: 10.0000 mg | Freq: Every day | RECTAL | Status: DC | PRN

## 2014-05-15 MED ORDER — METHOCARBAMOL 500 MG PO TABS
ORAL_TABLET | ORAL | Status: AC
  Filled 2014-05-15: qty 1

## 2014-05-15 MED ORDER — METOCLOPRAMIDE HCL 5 MG/ML IJ SOLN: 5.0000 mg | Freq: Three times a day (TID) | INTRAMUSCULAR | Status: DC | PRN

## 2014-05-15 MED ORDER — DEXAMETHASONE 6 MG PO TABS
10.0000 mg | ORAL_TABLET | Freq: Three times a day (TID) | ORAL | Status: AC
  Administered 2014-05-15 – 2014-05-16 (×3): 10 mg via ORAL
  Filled 2014-05-15 (×3): qty 1

## 2014-05-15 MED ORDER — EPHEDRINE SULFATE 50 MG/ML IJ SOLN
INTRAMUSCULAR | Status: DC | PRN
  Administered 2014-05-15 (×7): 10 mg via INTRAVENOUS

## 2014-05-15 MED ORDER — GLYCOPYRROLATE 0.2 MG/ML IJ SOLN
INTRAMUSCULAR | Status: DC | PRN
  Administered 2014-05-15: 0.6 mg via INTRAVENOUS

## 2014-05-15 MED ORDER — KETOROLAC TROMETHAMINE 15 MG/ML IJ SOLN
7.5000 mg | Freq: Four times a day (QID) | INTRAMUSCULAR | Status: AC
  Administered 2014-05-15 – 2014-05-16 (×4): 7.5 mg via INTRAVENOUS

## 2014-05-15 MED ORDER — DEXAMETHASONE SODIUM PHOSPHATE 4 MG/ML IJ SOLN
INTRAMUSCULAR | Status: DC | PRN
  Administered 2014-05-15: 4 mg via INTRAVENOUS

## 2014-05-15 MED ORDER — RIVAROXABAN 10 MG PO TABS: 10.0000 mg | ORAL_TABLET | Freq: Every day | ORAL | Status: DC

## 2014-05-15 MED ORDER — MENTHOL 3 MG MT LOZG: 1.0000 | LOZENGE | OROMUCOSAL | Status: DC | PRN

## 2014-05-15 MED ORDER — HYDROMORPHONE HCL PF 1 MG/ML IJ SOLN
INTRAMUSCULAR | Status: AC
  Filled 2014-05-15: qty 1

## 2014-05-15 MED ORDER — MIDAZOLAM HCL 2 MG/2ML IJ SOLN
INTRAMUSCULAR | Status: AC
  Filled 2014-05-15: qty 2

## 2014-05-15 MED ORDER — DOCUSATE SODIUM 100 MG PO CAPS
100.0000 mg | ORAL_CAPSULE | Freq: Two times a day (BID) | ORAL | Status: DC
  Administered 2014-05-15 – 2014-05-17 (×5): 100 mg via ORAL
  Filled 2014-05-15 (×6): qty 1

## 2014-05-15 MED ORDER — OXYCODONE HCL 5 MG PO TABS
5.0000 mg | ORAL_TABLET | Freq: Once | ORAL | Status: AC | PRN
  Administered 2014-05-15: 5 mg via ORAL

## 2014-05-15 MED ORDER — DIPHENHYDRAMINE HCL 12.5 MG/5ML PO ELIX: 12.5000 mg | ORAL_SOLUTION | ORAL | Status: DC | PRN

## 2014-05-15 MED ORDER — LACTATED RINGERS IV SOLN
INTRAVENOUS | Status: DC | PRN
  Administered 2014-05-15 (×3): via INTRAVENOUS

## 2014-05-15 SURGICAL SUPPLY — 57 items
BENZOIN TINCTURE PRP APPL 2/3 (GAUZE/BANDAGES/DRESSINGS) ×3 IMPLANT
BLADE SAW SAG 73X25 THK (BLADE) ×2
BLADE SAW SGTL 73X25 THK (BLADE) ×1 IMPLANT
BRUSH FEMORAL CANAL (MISCELLANEOUS) IMPLANT
CAPT HIP PF MOP ×3 IMPLANT
CLOSURE STERI-STRIP 1/2X4 (GAUZE/BANDAGES/DRESSINGS) ×1
CLSR STERI-STRIP ANTIMIC 1/2X4 (GAUZE/BANDAGES/DRESSINGS) ×2 IMPLANT
COVER SURGICAL LIGHT HANDLE (MISCELLANEOUS) ×3 IMPLANT
DRAPE INCISE IOBAN 66X45 STRL (DRAPES) IMPLANT
DRAPE ORTHO SPLIT 77X108 STRL (DRAPES) ×4
DRAPE PROXIMA HALF (DRAPES) ×3 IMPLANT
DRAPE SURG ORHT 6 SPLT 77X108 (DRAPES) ×2 IMPLANT
DRAPE U-SHAPE 47X51 STRL (DRAPES) ×3 IMPLANT
DRILL BIT 5/64 (BIT) ×3 IMPLANT
DRSG MEPILEX BORDER 4X12 (GAUZE/BANDAGES/DRESSINGS) IMPLANT
DRSG MEPILEX BORDER 4X8 (GAUZE/BANDAGES/DRESSINGS) ×3 IMPLANT
DRSG PAD ABDOMINAL 8X10 ST (GAUZE/BANDAGES/DRESSINGS) ×3 IMPLANT
DURAPREP 26ML APPLICATOR (WOUND CARE) ×3 IMPLANT
ELECT CAUTERY BLADE 6.4 (BLADE) ×3 IMPLANT
ELECT REM PT RETURN 9FT ADLT (ELECTROSURGICAL) ×3
ELECTRODE REM PT RTRN 9FT ADLT (ELECTROSURGICAL) ×1 IMPLANT
GAUZE SPONGE 4X4 12PLY STRL (GAUZE/BANDAGES/DRESSINGS) ×3 IMPLANT
GLOVE BIOGEL PI ORTHO PRO SZ8 (GLOVE) ×2
GLOVE ORTHO TXT STRL SZ7.5 (GLOVE) ×3 IMPLANT
GLOVE PI ORTHO PRO STRL SZ8 (GLOVE) ×1 IMPLANT
GLOVE SURG ORTHO 8.0 STRL STRW (GLOVE) ×3 IMPLANT
GOWN STRL REUS W/ TWL LRG LVL3 (GOWN DISPOSABLE) ×1 IMPLANT
GOWN STRL REUS W/TWL LRG LVL3 (GOWN DISPOSABLE) ×2
HANDPIECE INTERPULSE COAX TIP (DISPOSABLE)
HOOD PEEL AWAY FACE SHEILD DIS (HOOD) ×6 IMPLANT
KIT BASIN OR (CUSTOM PROCEDURE TRAY) ×3 IMPLANT
KIT ROOM TURNOVER OR (KITS) ×3 IMPLANT
MANIFOLD NEPTUNE II (INSTRUMENTS) ×3 IMPLANT
NEEDLE HYPO 25GX1X1/2 BEV (NEEDLE) ×3 IMPLANT
NS IRRIG 1000ML POUR BTL (IV SOLUTION) ×3 IMPLANT
PACK TOTAL JOINT (CUSTOM PROCEDURE TRAY) ×3 IMPLANT
PAD ARMBOARD 7.5X6 YLW CONV (MISCELLANEOUS) ×6 IMPLANT
PILLOW ABDUCTION HIP (SOFTGOODS) ×3 IMPLANT
PRESSURIZER FEMORAL UNIV (MISCELLANEOUS) IMPLANT
RETRIEVER SUT HEWSON (MISCELLANEOUS) ×3 IMPLANT
SET HNDPC FAN SPRY TIP SCT (DISPOSABLE) IMPLANT
SPONGE LAP 4X18 X RAY DECT (DISPOSABLE) IMPLANT
SUCTION FRAZIER TIP 10 FR DISP (SUCTIONS) ×3 IMPLANT
SUT FIBERWIRE #2 38 REV NDL BL (SUTURE) ×9
SUT MNCRL AB 4-0 PS2 18 (SUTURE) ×3 IMPLANT
SUT VIC AB 0 CT1 27 (SUTURE) ×2
SUT VIC AB 0 CT1 27XBRD ANBCTR (SUTURE) ×1 IMPLANT
SUT VIC AB 2-0 CT1 27 (SUTURE) ×2
SUT VIC AB 2-0 CT1 TAPERPNT 27 (SUTURE) ×1 IMPLANT
SUT VIC AB 3-0 SH 8-18 (SUTURE) ×3 IMPLANT
SUTURE FIBERWR#2 38 REV NDL BL (SUTURE) ×3 IMPLANT
SYR CONTROL 10ML LL (SYRINGE) ×3 IMPLANT
TOWEL OR 17X24 6PK STRL BLUE (TOWEL DISPOSABLE) ×3 IMPLANT
TOWEL OR 17X26 10 PK STRL BLUE (TOWEL DISPOSABLE) ×3 IMPLANT
TOWER CARTRIDGE SMART MIX (DISPOSABLE) IMPLANT
TRAY FOLEY CATH 14FR (SET/KITS/TRAYS/PACK) IMPLANT
WATER STERILE IRR 1000ML POUR (IV SOLUTION) ×6 IMPLANT

## 2014-05-15 NOTE — Progress Notes (Signed)
Patient with excessive bleeding at surgical right hip site. Dressing reinforced when assisted standing up to use urinal. Later, at approximately 1830, nurse tech informed this nurse, patient with pool of blood on floor. Original dressing saturated, removed. 4 x 4s and ABDs used to place new dressing. Patient assisted back to bed. Patient complaining of headache. Charge nurse aware and helped to dress surgical site. Spoke with Linton Rump a PA, informed him of patient's excessive bleeding. No further orders. Will report to oncoming night nurse to continue to monitor patient. Lind Covert, RN

## 2014-05-15 NOTE — Anesthesia Postprocedure Evaluation (Signed)
Anesthesia Post Note  Patient: Joe [name]  Procedure(s) Performed: Procedure(s) (LRB): RIGHT TOTAL HIP ARTHROPLASTY (Right)  Anesthesia type: general  Patient location: PACU  Post pain: Pain level controlled  Post assessment: Patient's Cardiovascular Status Stable  Last Vitals:  Filed Vitals:   05/15/14 1245  BP: 126/65  Pulse: 72  Temp:   Resp: 14    Post vital signs: Reviewed and stable  Level of consciousness: sedated  Complications: No apparent anesthesia complications

## 2014-05-15 NOTE — Plan of Care (Signed)
Problem: Consults Goal: Diagnosis- Total Joint Replacement Primary Total Hip     

## 2014-05-15 NOTE — Evaluation (Signed)
Physical Therapy Evaluation Patient Details Name: Joe [name] MRN: [id number] DOB: 1956/09/15 Today's Date: 05/15/2014   History of Present Illness  58 y.o. male s/p right total hip arthroplasty. Hx of cognitive deficits and recent (april 2015) left THA.  Clinical Impression  Pt is s/p right THA, via posterior approach, presenting with the deficits listed below (see PT Problem List). Ambulates up to 25 feet with min guard assist on post op day #0. Reviewed posterior hip precautions and handout with patient and his mother. Pt will benefit from skilled PT to increase their independence and safety with mobility to allow discharge to the venue listed below.       Follow Up Recommendations Home health PT;Supervision for mobility/OOB    Equipment Recommendations  None recommended by PT    Recommendations for Other Services OT consult     Precautions / Restrictions Precautions Precautions: Posterior Hip Precaution Booklet Issued: Yes (comment) Precaution Comments: Reviewed posterior hip precautions Restrictions Weight Bearing Restrictions: Yes RLE Weight Bearing: Weight bearing as tolerated      Mobility  Bed Mobility Overal bed mobility: Modified Independent                Transfers Overall transfer level: Needs assistance Equipment used: Rolling walker (2 wheeled) Transfers: Sit to/from Stand Sit to Stand: Min guard         General transfer comment: Min guard for safety. VC for technique to maintain posterior hip precautions. Good stability upon standing.  Ambulation/Gait Ambulation/Gait assistance: Min guard;+2 safety/equipment Ambulation Distance (Feet): 25 Feet Assistive device: Rolling walker (2 wheeled) Gait Pattern/deviations: Step-through pattern;Decreased step length - left;Decreased stance time - right;Antalgic   Gait velocity interpretation: Below normal speed for age/gender General Gait Details: Educated on safe DME use with VC for sequencing.  Second person available for safety. No loss of balance while using rolling walker.  Stairs            Wheelchair Mobility    Modified Rankin (Stroke Patients Only)       Balance Overall balance assessment: Needs assistance Sitting-balance support: No upper extremity supported;Feet supported Sitting balance-Leahy Scale: Good     Standing balance support: No upper extremity supported Standing balance-Leahy Scale: Fair                               Pertinent Vitals/Pain Pt reports pain as "pretty low" no numerical value given Moderate drainage through gown and bed pad from surgical site. Nurse notified Patient repositioned in chair for comfort.     Home Living Family/patient expects to be discharged to:: Private residence Living Arrangements: Parent Available Help at Discharge: Available 24 hours/day;Family Type of Home: House Home Access: Stairs to enter Entrance Stairs-Rails: Right Entrance Stairs-Number of Steps: 2-3 Home Layout: Two level;Able to live on main level with bedroom/bathroom Home Equipment: Gilford Rile - 2 wheels;Toilet riser;Tub bench;Bedside commode      Prior Function Level of Independence: Needs assistance   Gait / Transfers Assistance Needed: Independent with gait.  ADL's / Homemaking Assistance Needed: Independent with bath/dress - lives with parents to help assist with other ADLS  Comments: Assistance for cognitive deficits. Did not need physical assist     Hand Dominance   Dominant Hand: Right    Extremity/Trunk Assessment   Upper Extremity Assessment: Defer to OT evaluation           Lower Extremity Assessment: RLE deficits/detail RLE Deficits / Details: Decreased strength  and ROM as expected post op       Communication   Communication: Expressive difficulties (Hx of intellectual disability)  Cognition Arousal/Alertness: Awake/alert Behavior During Therapy: WFL for tasks assessed/performed Overall Cognitive  Status: History of cognitive impairments - at baseline                      General Comments General comments (skin integrity, edema, etc.): Moderate drainage from surgical site saturating gown and bed pad. Nurse notified. Pt mother was present during therapy session and assisted in answering questions intermittently.    Exercises Total Joint Exercises Ankle Circles/Pumps: AROM;Both;10 reps;Seated Quad Sets: AROM;Both;10 reps;Seated      Assessment/Plan    PT Assessment Patient needs continued PT services  PT Diagnosis Difficulty walking;Abnormality of gait;Acute pain   PT Problem List Decreased strength;Decreased range of motion;Decreased activity tolerance;Decreased balance;Decreased mobility;Decreased cognition;Decreased knowledge of use of DME;Decreased knowledge of precautions;Pain  PT Treatment Interventions DME instruction;Gait training;Stair training;Functional mobility training;Therapeutic activities;Therapeutic exercise;Balance training;Neuromuscular re-education;Cognitive remediation;Patient/family education;Modalities   PT Goals (Current goals can be found in the Care Plan section) Acute Rehab PT Goals Patient Stated Goal: None stated PT Goal Formulation: With patient/family Time For Goal Achievement: 05/22/14 Potential to Achieve Goals: Good    Frequency 7X/week   Barriers to discharge        Co-evaluation               End of Session   Activity Tolerance: Patient tolerated treatment well Patient left: in chair;with call bell/phone within reach;with family/visitor present Nurse Communication: Mobility status;Other (comment) (Bleeding from surgical site saturated dressing)         Time: 7672-0947 PT Time Calculation (min): 22 min   Charges:   PT Evaluation $Initial PT Evaluation Tier I: 1 Procedure PT Treatments $Therapeutic Activity: 8-22 mins   PT G Codes:         Elayne Snare, [place]  [name] 05/15/2014, 3:23  PM

## 2014-05-15 NOTE — Op Note (Signed)
05/15/2014  9:55 AM  PATIENT:  Joe [name]   MRN: [id number]  PRE-OPERATIVE DIAGNOSIS:  right hip DJD  POST-OPERATIVE DIAGNOSIS:  right hip DJD  PROCEDURE:  Procedure(s): RIGHT TOTAL HIP ARTHROPLASTY  PREOPERATIVE INDICATIONS:    Joe [name] is an 58 y.o. male who has a diagnosis of Osteoarthritis of right hip and elected for surgical management after failing conservative treatment.  The risks benefits and alternatives were discussed with the patient including but not limited to the risks of nonoperative treatment, versus surgical intervention including infection, bleeding, nerve injury, periprosthetic fracture, the need for revision surgery, dislocation, leg length discrepancy, blood clots, cardiopulmonary complications, morbidity, mortality, among others, and they were willing to proceed.     OPERATIVE REPORT     SURGEON:  Marchia Bond, MD    ASSISTANT:  Joya Gaskins, OPA-C  (Present throughout the entire procedure,  necessary for completion of procedure in a timely manner, assisting with retraction, instrumentation, and closure)     Second assistant: [facility], PA Student   ANESTHESIA:  General    COMPLICATIONS:  None.     COMPONENTS:  [employer] fit femur size 5 high offset with a +5 mm +36 head ball and a gription acetabular shell size 56 with a 10 degree lipped polyethylene liner    PROCEDURE IN DETAIL:   The patient was met in the holding area and  identified.  The appropriate hip was identified and marked at the operative site.  The patient was then transported to the OR  and  placed under general anesthesia.  At that point, the patient was  placed in the lateral decubitus position with the operative side up and  secured to the operating room table and all bony prominences padded.     The operative lower extremity was prepped from the iliac crest to the distal leg.  Sterile draping was performed.  Time out was performed prior to incision.      A  routine posterolateral approach was utilized via sharp dissection  carried down to the subcutaneous tissue.  Gross bleeders were Bovie coagulated.  The iliotibial band was identified and incised along the length of the skin incision.  Self-retaining retractors were  inserted.  With the hip internally rotated, the short external rotators  were identified. The piriformis and capsule was tagged with FiberWire, and the hip capsule released in a T-type fashion.  The femoral neck was exposed, and I resected the femoral neck using the appropriate jig. This was performed at approximately a thumb's breadth above the lesser trochanter.    I then exposed the deep acetabulum, cleared out any tissue including the ligamentum teres.   After adequate visualization, I excised the labrum, and then sequentially reamed.  I placed the trial acetabulum, which seated nicely, and then impacted the real cup into place.  Appropriate version and inclination was confirmed clinically matching their bony anatomy, and also with the use of the jig.There were substantial hypertrophic osteophyte inferiorly which were removed.  A trial polyethylene liner was placed and the wing retractor removed.    I then prepared the proximal femur using the cookie-cutter, the lateralizing reamer, and then sequentially reamed and broached.  A trial broach, neck, and head was utilized, and I reduced the hip and it was found to have excellent stability with functional range of motion. Initially I trialed with the standard neck, which did not provide adequate length restoration or stability, and I moved up to a +5, and  then there was still a small amount of shuck, which I eliminated with the high offset.The trial components were then removed, and the real polyethylene liner was placed with the lip directed posteriorly.  I then impacted the real femoral prosthesis into place into the appropriate version, slightly anteverted to the normal anatomy, and I  impacted the real head ball into place. The hip was then reduced and taken through functional range of motion and found to have excellent stability. Leg lengths were restored.  I then used a 2 mm drill bits to pass the FiberWire suture from the capsule and piriformis through the greater trochanter, and secured this. Excellent posterior capsular repair was achieved. I also closed the T in the capsule.  I then irrigated the hip copiously again with pulse lavage, and repaired the fascia with Vicryl, followed by Vicryl for the subcutaneous tissue, Monocryl for the skin, Steri-Strips and sterile gauze. The wounds were injected. The patient was then awakened and returned to PACU in stable and satisfactory condition. There were no complications.  Marchia Bond, MD Orthopedic Surgeon [phone]   05/15/2014 9:55 AM

## 2014-05-15 NOTE — Anesthesia Preprocedure Evaluation (Signed)
Anesthesia Evaluation  Patient identified by MRN, date of birth, ID band Patient awake    Reviewed: Allergy & Precautions, H&P , NPO status , Patient's Chart, lab work & pertinent test results  Airway Mallampati: I TM Distance: >3 FB Neck ROM: Full    Dental   Pulmonary          Cardiovascular     Neuro/Psych Anxiety    GI/Hepatic   Endo/Other    Renal/GU      Musculoskeletal   Abdominal   Peds  Hematology   Anesthesia Other Findings   Reproductive/Obstetrics                           Anesthesia Physical Anesthesia Plan  ASA: III  Anesthesia Plan: General   Post-op Pain Management:    Induction: Intravenous  Airway Management Planned: Oral ETT  Additional Equipment:   Intra-op Plan:   Post-operative Plan: Extubation in OR  Informed Consent: I have reviewed the patients History and Physical, chart, labs and discussed the procedure including the risks, benefits and alternatives for the proposed anesthesia with the patient or authorized representative who has indicated his/her understanding and acceptance.     Plan Discussed with: CRNA and Surgeon  Anesthesia Plan Comments:         Anesthesia Quick Evaluation

## 2014-05-15 NOTE — H&P (Signed)
PREOPERATIVE H&P  Chief Complaint: right hip DJD  HPI: Joe [name] is a 58 y.o. male who presents for preoperative history and physical with a diagnosis of right hip DJD. Symptoms are rated as moderate to severe, and have been worsening.  This is significantly impairing activities of daily living.  He has elected for surgical management. He has tried anti-inflammatories, injections, activity modification, and assistive walking devices without relief. He had a hip replacement done on the other side and was very happy with his pain relief with that.  Past Medical History  Diagnosis Date  . Right ureteral stone   . Cognitive deficits   . Anxiety     over surgery  . 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 Expressive speech delay   . Arthritis     BACK AND SHOULDER  . Congenital brain damage   . Speech impediment   . Umbilical hernia   . Constipation   . Osteoarthritis of left hip 01/16/2014  . History of kidney stones    Past Surgical History  Procedure Laterality Date  . Cystoscopy with stent placement Right 03/24/2013    Procedure: CYSTOSCOPY WITH STENT PLACEMENT;  Surgeon: Hanley Ben, MD;  Location: [facility];  Service: Urology;  Laterality: Right;  . Inguinal hernia repair Left 03-04-2005  . Posterior lumbar fusion  08-27-2011    L4 -- L5  . Tonsillectomy  AS CHILD  . Right shoulder hemiarthroplasty  08-26-2010    OA  . Cystoscopy with retrograde pyelogram, ureteroscopy and stent placement Right 04/19/2013    Procedure: CYSTOSCOPY WITH RETROGRADE PYELOGRAM, URETEROSCOPY ;  Surgeon: Molli Hazard, MD;  Location: [facility];  Service: Urology;  Laterality: Right;  . Holmium laser application Right 03/14/1307    Procedure: HOLMIUM LASER APPLICATION;  Surgeon: Molli Hazard, MD;  Location: [facility];  Service: Urology;  Laterality: Right;  . Cystoscopy w/ ureteral stent removal Right  04/19/2013    Procedure: CYSTOSCOPY WITH STENT REMOVAL;  Surgeon: Molli Hazard, MD;  Location: [facility];  Service: Urology;  Laterality: Right;  . Total hip arthroplasty Left 01/16/2014    Procedure: TOTAL HIP ARTHROPLASTY;  Surgeon: Johnny Bridge, MD;  Location: [place];  Service: Orthopedics;  Laterality: Left;  . Back surgery     History   Social History  . Marital Status: Single    Spouse Name: N/A    Number of Children: N/A  . Years of Education: N/A   Social History Main Topics  . Smoking status: Never Smoker   . Smokeless tobacco: Never Used  . Alcohol Use: No  . Drug Use: No  . Sexual Activity: None   Other Topics Concern  . 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  Lives with parents.   History reviewed. No pertinent family history. Allergies  Allergen Reactions  . Levaquin [Levofloxacin In D5w] Diarrhea    SEVERE   Prior to Admission medications   Medication Sig Start Date End Date Taking? Authorizing Provider  acetaminophen (TYLENOL) 500 MG tablet Take 500 mg by mouth every 6 (six) hours as needed for mild pain.   Yes Historical Provider, MD  senna-docusate (SENOKOT-S) 8.6-50 MG per tablet Take 1 tablet  by mouth daily as needed for mild constipation.   Yes Historical Provider, MD  traMADol (ULTRAM) 50 MG tablet Take 50 mg by mouth every 6 (six) hours as needed for moderate pain.    Yes Historical Provider, MD  vitamin C (ASCORBIC ACID) 500 MG tablet Take 500 mg by mouth daily.   Yes Historical Provider, MD     Positive ROS: All other systems have been reviewed and were otherwise negative with the exception of those mentioned in the HPI and as above.  Physical Exam: General: Alert, no acute distress Cardiovascular: No pedal edema Respiratory: No cyanosis, no use of accessory  musculature GI: No organomegaly, abdomen is soft and non-tender Skin: No lesions in the area of chief complaint Neurologic: Sensation intact distally Psychiatric: Patient is competent for consent with normal mood and affect Lymphatic: No axillary or cervical lymphadenopathy  MUSCULOSKELETAL: Right hip has range of motion 0-80, no internal or external rotation. EHL and FHL are firing.  Assessment: right hip DJD  Plan: Plan for Procedure(s): RIGHT TOTAL HIP ARTHROPLASTY  The risks benefits and alternatives were discussed with the patient including but not limited to the risks of nonoperative treatment, versus surgical intervention including infection, bleeding, nerve injury, periprosthetic fracture, the need for revision surgery, dislocation, leg length discrepancy, blood clots, cardiopulmonary complications, morbidity, mortality, among others, and they were willing to proceed.     Johnny Bridge, MD Cell [phone]   05/15/2014 7:16 AM

## 2014-05-15 NOTE — Progress Notes (Signed)
Utilization review completed.  

## 2014-05-15 NOTE — Transfer of Care (Signed)
Immediate Anesthesia Transfer of Care Note  Patient: Joe [name]  Procedure(s) Performed: Procedure(s): RIGHT TOTAL HIP ARTHROPLASTY (Right)  Patient Location: PACU  Anesthesia Type:General  Level of Consciousness: awake, alert , oriented, patient cooperative and responds to stimulation  Airway & Oxygen Therapy: Patient Spontanous Breathing and Patient connected to face mask oxygen  Post-op Assessment: Report given to PACU RN, Post -op Vital signs reviewed and stable and Patient moving all extremities  Post vital signs: Reviewed and stable  Complications: No apparent anesthesia complications

## 2014-05-16 LAB — BASIC METABOLIC PANEL
Anion gap: 14 (ref 5–15)
BUN: 10 mg/dL (ref 6–23)
CALCIUM: 8.8 mg/dL (ref 8.4–10.5)
CO2: 23 mEq/L (ref 19–32)
CREATININE: 0.46 mg/dL — AB (ref 0.50–1.35)
Chloride: 104 mEq/L (ref 96–112)
GFR calc Af Amer: >90 mL/min (ref >90)
GFR calc non Af Amer: >90 mL/min (ref >90)
GLUCOSE: 123 mg/dL — AB (ref 70–99)
Potassium: 4.6 mEq/L (ref 3.7–5.3)
SODIUM: 141 meq/L (ref 137–147)

## 2014-05-16 LAB — CBC
HCT: 37.1 % — ABNORMAL LOW (ref 39.0–52.0)
Hemoglobin: 12.3 g/dL — ABNORMAL LOW (ref 13.0–17.0)
MCH: 33.2 pg (ref 26.0–34.0)
MCHC: 33.2 g/dL (ref 30.0–36.0)
MCV: 100.3 fL — ABNORMAL HIGH (ref 78.0–100.0)
Platelets: 219 10*3/uL (ref 150–400)
RBC: 3.7 MIL/uL — ABNORMAL LOW (ref 4.22–5.81)
RDW: 14.8 % (ref 11.5–15.5)
WBC: 9.9 10*3/uL (ref 4.0–10.5)

## 2014-05-16 MED ORDER — BACLOFEN 10 MG PO TABS: 10.0000 mg | ORAL_TABLET | Freq: Three times a day (TID) | ORAL | Status: DC

## 2014-05-16 MED ORDER — KETOROLAC TROMETHAMINE 15 MG/ML IJ SOLN
INTRAMUSCULAR | Status: AC
  Filled 2014-05-16: qty 1

## 2014-05-16 NOTE — Progress Notes (Signed)
Physical Therapy Treatment Patient Details Name: Joe [name] MRN: [id number] DOB: 24-Jan-1956 Today's Date: 05/16/2014    History of Present Illness 58 y.o. male s/p right total hip arthroplasty. Hx of cognitive deficits and recent (april 2015) left THA.    PT Comments    Patient is progressing well towards physical therapy goals, ambulating up to 150 feet with supervision while using a rolling walker. Safely complete stair training today and performs this task well with no further questions. Patient will continue to benefit from skilled physical therapy services at home with HHPT to further improve independence with functional mobility.    Follow Up Recommendations  Home health PT;Supervision for mobility/OOB     Equipment Recommendations  None recommended by PT    Recommendations for Other Services OT consult     Precautions / Restrictions Precautions Precautions: Posterior Hip Precaution Booklet Issued: Yes (comment) Precaution Comments: Reviewed posterior hip precautions Restrictions Weight Bearing Restrictions: Yes RLE Weight Bearing: Weight bearing as tolerated    Mobility  Bed Mobility Overal bed mobility: Modified Independent                Transfers Overall transfer level: Needs assistance Equipment used: Rolling walker (2 wheeled) Transfers: Sit to/from Stand Sit to Stand: Supervision         General transfer comment: Supervision for safety. Correctly places hand on stable surface. Able to bring RLE forward to maintain Posterior hip precautions  Ambulation/Gait Ambulation/Gait assistance: Supervision Ambulation Distance (Feet): 150 Feet (additional bout of 125) Assistive device: Rolling walker (2 wheeled) Gait Pattern/deviations: Step-through pattern;Decreased step length - left;Decreased stance time - right;Antalgic   Gait velocity interpretation: Below normal speed for age/gender General Gait Details: VC for forward gaze. Instructions for  symmetical step length. No loss of balance during bout and demonstrates proper use of DME.   Stairs Stairs: Yes Stairs assistance: Min guard Stair Management: One rail Right;Step to pattern;Sideways Number of Stairs: 2 (x2) General stair comments: Demonstrated to patient and his mother prior to having patient practice. Able to safely perform with VC for step sequence. Pt able to teach back technique on second trial.  Wheelchair Mobility    Modified Rankin (Stroke Patients Only)       Balance                                    Cognition Arousal/Alertness: Awake/alert Behavior During Therapy: WFL for tasks assessed/performed Overall Cognitive Status: History of cognitive impairments - at baseline                      Exercises Total Joint Exercises Ankle Circles/Pumps: AROM;Both;10 reps;Seated Quad Sets: AROM;Both;10 reps;Seated Heel Slides: AAROM;Right;10 reps;Seated Hip ABduction/ADduction: AAROM;Right;10 reps;Supine Straight Leg Raises: AAROM;Right;10 reps;Supine Long Arc Quad: Strengthening;Right;10 reps;Seated    General Comments        Pertinent Vitals/Pain 4/10 pain of right hip Reports he does not wish for pain medication from nurse at this time Patient repositioned in chair for comfort.     Home Living                      Prior Function            PT Goals (current goals can now be found in the care plan section) Acute Rehab PT Goals PT Goal Formulation: With patient/family Time For Goal Achievement: 05/22/14 Potential to Achieve Goals:  Good Progress towards PT goals: Progressing toward goals    Frequency  7X/week    PT Plan Current plan remains appropriate    Co-evaluation             End of Session   Activity Tolerance: Patient tolerated treatment well Patient left: in chair;with call bell/phone within reach;with family/visitor present     Time: 1435-1500 PT Time Calculation (min): 25  min  Charges:  $Gait Training: 8-22 mins $Therapeutic Exercise: 8-22 mins                    G Codes:      [employer], [place]  [name] 05/16/2014, 4:32 PM

## 2014-05-16 NOTE — Progress Notes (Signed)
OT Cancellation Note  Patient Details Name: Joe [name] MRN: [id number] DOB: October 22, 1955   Cancelled Treatment:     Pt was previously seen for L THA in April by this OT. Pt and mother both report no problems with last THA upon return home and have no ADL concerns. Per PT note, pt ambulating 150 ft with Supervision using RW. Reviewed posterior hip precautions with pt and mother. Acute OT to sign off.   Juluis Rainier [phone] 05/16/2014, 4:40 PM

## 2014-05-16 NOTE — Progress Notes (Signed)
     Subjective:  Patient reports pain as mild.  Walked with PT yesterday.  Objective:   VITALS:   Filed Vitals:   05/16/14 0000 05/16/14 0200 05/16/14 0400 05/16/14 0527  BP:  117/65  126/66  Pulse:  62  64  Temp:  98.2 F (36.8 C)  98.2 F (36.8 C)  TempSrc:      Resp: 16 16 16 16   Height:      Weight:      SpO2: 98% 94% 98% 98%    Neurologically intact Sensation intact distally Dorsiflexion/Plantar flexion intact Incision: dressing C/D/I New dressing was applied yesterday due to bloody discharge.  Lab Results  Component Value Date   WBC 9.9 05/16/2014   HGB 12.3* 05/16/2014   HCT 37.1* 05/16/2014   MCV 100.3* 05/16/2014   PLT 219 05/16/2014   BMET    Component Value Date/Time   NA 141 05/16/2014 0600   K 4.6 05/16/2014 0600   CL 104 05/16/2014 0600   CO2 23 05/16/2014 0600   GLUCOSE 123* 05/16/2014 0600   BUN 10 05/16/2014 0600   CREATININE 0.46* 05/16/2014 0600   CALCIUM 8.8 05/16/2014 0600   GFRNONAA >90 05/16/2014 0600   GFRAA >90 05/16/2014 0600     Assessment/Plan: 1 Day Post-Op   Principal Problem:   Osteoarthritis of right hip Active Problems:   Hip arthritis   Advance diet Up with therapy Plan for discharge tomorrow Discharge home with home health    Rain[place] 05/16/2014, 8:35 AM   Marchia Bond, MD Cell [phone]

## 2014-05-16 NOTE — Discharge Instructions (Signed)
Diet: As you were doing prior to hospitalization  ° °Shower:  May shower but keep the wounds dry, use an occlusive plastic wrap, NO SOAKING IN TUB.  If the bandage gets wet, change with a clean dry gauze. ° °Dressing:  You may change your dressing 3-5 days after surgery.  Then change the dressing daily with sterile gauze dressing.   ° °There are sticky tapes (steri-strips) on your wounds and all the stitches are absorbable.  Leave the steri-strips in place when changing your dressings, they will peel off with time, usually 2-3 weeks. ° °Activity:  Increase activity slowly as tolerated, but follow the weight bearing instructions below.  No lifting or driving for 6 weeks. ° °Weight Bearing:   As tolerated.   ° °To prevent constipation: you may use a stool softener such as - ° °Colace (over the counter) 100 mg by mouth twice a day  °Drink plenty of fluids (prune juice may be helpful) and high fiber foods °Miralax (over the counter) for constipation as needed.   ° °Itching:  If you experience itching with your medications, try taking only a single pain pill, or even half a pain pill at a time.  You may take up to 10 pain pills per day, and you can also use benadryl over the counter for itching or also to help with sleep.  ° °Precautions:  If you experience chest pain or shortness of breath - call 911 immediately for transfer to the hospital emergency department!! ° °If you develop a fever greater that 101 F, purulent drainage from wound, increased redness or drainage from wound, or calf pain -- Call the office at [phone]                                                °Follow- Up Appointment:  Please call for an appointment to be seen in 2 weeks [place] - [phone] ° °___________________________________ ° ° °Information on my medicine - XARELTO® (Rivaroxaban) ° °This medication education was reviewed with me or my healthcare representative as part of my discharge preparation.  The pharmacist that spoke with  me during my hospital stay was:  Bernerd Terhune P, RPH ° °Why was Xarelto® prescribed for you? °Xarelto® was prescribed for you to reduce the risk of blood clots forming after orthopedic surgery. The medical term for these abnormal blood clots is venous thromboembolism (VTE). ° °What do you need to know about xarelto® ? °Take your Xarelto® ONCE DAILY at the same time every day. °You may take it either with or without food. ° °If you have difficulty swallowing the tablet whole, you may crush it and mix in applesauce just prior to taking your dose. ° °Take Xarelto® exactly as prescribed by your doctor and DO NOT stop taking Xarelto® without talking to the doctor who prescribed the medication.  Stopping without other VTE prevention medication to take the place of Xarelto® may increase your risk of developing a clot. ° °After discharge, you should have regular check-up appointments with your healthcare provider that is prescribing your Xarelto®.   ° °What do you do if you miss a dose? °If you miss a dose, take it as soon as you remember on the same day then continue your regularly scheduled once daily regimen the next day. Do not take two doses of Xarelto® on the same day.  ° °  Important Safety Information °A possible side effect of Xarelto® is bleeding. You should call your healthcare provider right away if you experience any of the following: °  Bleeding from an injury or your nose that does not stop. °  Unusual colored urine (red or dark brown) or unusual colored stools (red or black). °  Unusual bruising for unknown reasons. °  A serious fall or if you hit your head (even if there is no bleeding). ° °Some medicines may interact with Xarelto® and might increase your risk of bleeding while on Xarelto®. To help avoid this, consult your healthcare provider or pharmacist prior to using any new prescription or non-prescription medications, including herbals, vitamins, non-steroidal anti-inflammatory drugs (NSAIDs) and  supplements. ° °This website has more information on Xarelto®: www.xarelto.com. ° ° °

## 2014-05-16 NOTE — Care Management Note (Signed)
CARE MANAGEMENT NOTE 05/16/2014  Patient:  TYRIN, HERBERS A   Account Number:  [id number]  Date Initiated:  05/16/2014  Documentation initiated by:  Ricki Miller  Subjective/Objective Assessment:   58 yr old male s/p right total hip arthroplasty.     Action/Plan:   Case manager spoke with patient's mom concerning home health and DME needs. She has wheelchair, walker and 3in1. Preoperatively setup with [place], no changes.   Anticipated DC Date:  05/17/2014   Anticipated DC Plan:  [place]  CM consult      [facility] Choice  HOME HEALTH   Choice offered to / List presented to:  C-1 Patient   DME arranged  NA        [place] arranged  [facility]      [place].   Status of service:  Completed, signed off Medicare Important Message given?  NA - LOS <3 / Initial given by admissions (If response is "NO", the following Medicare IM given date fields will be blank) Date Medicare IM given:   Medicare IM given by:   Date Additional Medicare IM given:   Additional Medicare IM given by:    Discharge Disposition:  [place]  Per UR Regulation:  Reviewed for med. necessity/level of care/duration of stay

## 2014-05-16 NOTE — Progress Notes (Signed)
Physical Therapy Treatment Patient Details Name: Joe [name] MRN: [id number] DOB: [birth year] Today's Date: 05/16/2014    History of Present Illness 58 y.o. male s/p right total hip arthroplasty. Hx of cognitive deficits and recent (april 2015) left THA.    PT Comments    Patient is progressing well towards physical therapy goals, ambulating up to 85 feet with min guard for safety while using rolling walker. Reviewed posterior hip precautions, and tolerating therapeutic exercises well. Plan for stair training this afternoon. Patient will continue to benefit from skilled physical therapy services to further improve independence with functional mobility.    Follow Up Recommendations  Home health PT;Supervision for mobility/OOB     Equipment Recommendations  None recommended by PT    Recommendations for Other Services OT consult     Precautions / Restrictions Precautions Precautions: Posterior Hip Precaution Booklet Issued: Yes (comment) Precaution Comments: Reviewed posterior hip precautions Restrictions Weight Bearing Restrictions: Yes RLE Weight Bearing: Weight bearing as tolerated    Mobility  Bed Mobility Overal bed mobility: Modified Independent                Transfers Overall transfer level: Needs assistance Equipment used: Rolling walker (2 wheeled) Transfers: Sit to/from Stand Sit to Stand: Min guard         General transfer comment: Min guard for safety. VC for technique to maintain posterior hip precautions. Good stability upon standing.  Ambulation/Gait Ambulation/Gait assistance: Min guard Ambulation Distance (Feet): 85 Feet Assistive device: Rolling walker (2 wheeled) Gait Pattern/deviations: Step-through pattern;Decreased step length - left;Decreased stance time - right;Antalgic;Decreased dorsiflexion - right   Gait velocity interpretation: Below normal speed for age/gender General Gait Details: Instructions for walker placement and to  focus on Rt heel strike. Mildly antalgic. Improved ability to maintain posterior hip precautions with turns following VCs.   Stairs            Wheelchair Mobility    Modified Rankin (Stroke Patients Only)       Balance                                    Cognition Arousal/Alertness: Awake/alert Behavior During Therapy: WFL for tasks assessed/performed Overall Cognitive Status: History of cognitive impairments - at baseline                      Exercises Total Joint Exercises Ankle Circles/Pumps: AROM;Both;10 reps;Seated Quad Sets: AROM;Both;10 reps;Seated Gluteal Sets: Strengthening;Both;10 reps    General Comments        Pertinent Vitals/Pain 5/10 pain Nurse notified Patient repositioned in chair for comfort.     Home Living                      Prior Function            PT Goals (current goals can now be found in the care plan section) Acute Rehab PT Goals PT Goal Formulation: With patient/family Time For Goal Achievement: 05/22/14 Potential to Achieve Goals: Good Progress towards PT goals: Progressing toward goals    Frequency  7X/week    PT Plan Current plan remains appropriate    Co-evaluation             End of Session   Activity Tolerance: Patient tolerated treatment well Patient left: in chair;with call bell/phone within reach;with family/visitor present     Time: 6644-0347 PT Time  Calculation (min): 24 min  Charges:  $Gait Training: 8-22 mins $Therapeutic Exercise: 8-22 mins                    G Codes:      [employer], [name]  [name] 05/16/2014, 10:37 AM

## 2014-05-17 ENCOUNTER — Encounter (HOSPITAL_COMMUNITY): Payer: Self-pay | Admitting: Orthopedic Surgery

## 2014-05-17 LAB — BASIC METABOLIC PANEL
ANION GAP: 12 (ref 5–15)
BUN: 13 mg/dL (ref 6–23)
CALCIUM: 8.8 mg/dL (ref 8.4–10.5)
CO2: 24 mEq/L (ref 19–32)
CREATININE: 0.55 mg/dL (ref 0.50–1.35)
Chloride: 103 mEq/L (ref 96–112)
GFR calc Af Amer: >90 mL/min (ref >90)
Glucose, Bld: 96 mg/dL (ref 70–99)
Potassium: 4.3 mEq/L (ref 3.7–5.3)
Sodium: 139 mEq/L (ref 137–147)

## 2014-05-17 LAB — CBC
HCT: 32.6 % — ABNORMAL LOW (ref 39.0–52.0)
Hemoglobin: 11.2 g/dL — ABNORMAL LOW (ref 13.0–17.0)
MCH: 33.1 pg (ref 26.0–34.0)
MCHC: 34.4 g/dL (ref 30.0–36.0)
MCV: 96.4 fL (ref 78.0–100.0)
PLATELETS: 236 10*3/uL (ref 150–400)
RBC: 3.38 MIL/uL — ABNORMAL LOW (ref 4.22–5.81)
RDW: 14.9 % (ref 11.5–15.5)
WBC: 8.8 10*3/uL (ref 4.0–10.5)

## 2014-05-17 NOTE — Discharge Summary (Signed)
Physician Discharge Summary  Patient ID: Joe [name] MRN: [id number] DOB/AGE: 11-Dec-1955 58 y.o.  Admit date: 05/15/2014 Discharge date: 05/17/2014  Admission Diagnoses:  Osteoarthritis of right hip  Discharge Diagnoses:  Principal Problem:   Osteoarthritis of right hip Active Problems:   Hip arthritis   Past Medical History  Diagnosis Date  . Right ureteral stone   . Cognitive deficits   . Anxiety     over surgery  . 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 Expressive speech delay   . Arthritis     BACK AND SHOULDER  . Congenital brain damage   . Speech impediment   . Umbilical hernia   . Constipation   . Osteoarthritis of left hip 01/16/2014  . History of kidney stones   . Osteoarthritis of right hip 05/15/2014    Surgeries: Procedure(s): RIGHT TOTAL HIP ARTHROPLASTY on 05/15/2014   Consultants (if any):    Discharged Condition: Improved  Hospital Course: Joe [name] is an 58 y.o. male who was admitted 05/15/2014 with a diagnosis of Osteoarthritis of right hip and went to the operating room on 05/15/2014 and underwent the above named procedures.    He was given perioperative antibiotics:  Anti-infectives   Start     Dose/Rate Route Frequency Ordered Stop   05/15/14 1330  ceFAZolin (ANCEF) IVPB 2 g/50 mL premix     2 g 100 mL/hr over 30 Minutes Intravenous Every 6 hours 05/15/14 1251 05/15/14 2123   05/15/14 0600  ceFAZolin (ANCEF) IVPB 2 g/50 mL premix     2 g 100 mL/hr over 30 Minutes Intravenous On call to O.R. 05/14/14 1409 05/15/14 0726    .  He was given sequential compression devices, early ambulation, and xarelto for DVT prophylaxis.  He benefited maximally from the hospital stay and there were no complications.    Recent vital signs:  Filed Vitals:   05/17/14 0623  BP: 121/67  Pulse: 79  Temp: 97.5 F (36.4 C)  Resp: 18    Recent laboratory studies:  Lab Results  Component Value Date   HGB  12.3* 05/16/2014   HGB 13.6 05/04/2014   HGB 11.1* 01/18/2014   Lab Results  Component Value Date   WBC 9.9 05/16/2014   PLT 219 05/16/2014   Lab Results  Component Value Date   INR 1.05 01/09/2014   Lab Results  Component Value Date   NA 141 05/16/2014   K 4.6 05/16/2014   CL 104 05/16/2014   CO2 23 05/16/2014   BUN 10 05/16/2014   CREATININE 0.46* 05/16/2014   GLUCOSE 123* 05/16/2014    Discharge Medications:     Medication List    STOP taking these medications       acetaminophen 500 MG tablet  Commonly known as:  TYLENOL      TAKE these medications       baclofen 10 MG tablet  Commonly known as:  LIORESAL  Take 1 tablet (10 mg total) by mouth 3 (three) times daily. As needed for muscle spasm     oxyCODONE-acetaminophen 10-325 MG per tablet  Commonly known as:  PERCOCET  Take 1-2 tablets by mouth every 6 (six) hours as needed for pain. MAXIMUM TOTAL ACETAMINOPHEN DOSE IS 4000 MG PER DAY     rivaroxaban 10 MG Tabs tablet  Commonly known as:  XARELTO  Take 1 tablet (10 mg total) by mouth daily.  senna-docusate 8.6-50 MG per tablet  Commonly known as:  Senokot-S  Take 1 tablet by mouth daily as needed for mild constipation.     sennosides-docusate sodium 8.6-50 MG tablet  Commonly known as:  SENOKOT-S  Take 2 tablets by mouth daily.     traMADol 50 MG tablet  Commonly known as:  ULTRAM  Take 50 mg by mouth every 6 (six) hours as needed for moderate pain.     vitamin C 500 MG tablet  Commonly known as:  ASCORBIC ACID  Take 500 mg by mouth daily.        Diagnostic Studies: Dg Pelvis Portable  05/15/2014   CLINICAL DATA:  Severe osteoarthritis of the right hip. Status post right total hip prosthesis insertion.  EXAM: PORTABLE PELVIS 1-2 VIEWS  COMPARISON:  01/16/2014  FINDINGS: The acetabular and femoral components of the right hip prosthesis appear in excellent position in the AP projection. No fractures. Left hip prosthesis appears unchanged. No acute abnormality of the  pelvic bones.  IMPRESSION: Satisfactory appearance of the right hip in the AP projection after total hip prosthesis insertion.   Electronically Signed   By: Rozetta Nunnery M.D.   On: 05/15/2014 11:04   Dg Hip Portable 1 View Right  05/15/2014   CLINICAL DATA:  Severe osteoarthritis of the right hip. Status post total hip prosthesis insertion.  EXAM: PORTABLE RIGHT HIP - 1 VIEW  COMPARISON:  01/16/2014  FINDINGS: Acetabular and femoral components of the total hip prosthesis appear in good position in the lateral projection. No visible fractures.  IMPRESSION: Satisfactory appearance of the right hip prosthesis in the lateral projection.   Electronically Signed   By: Rozetta Nunnery M.D.   On: 05/15/2014 11:05    Disposition: 06-Home-Health Care Svc      Discharge Instructions   Weight bearing as tolerated    Complete by:  As directed            Follow-up Information   Follow up with Johnny Bridge, MD. Schedule an appointment as soon as possible for a visit in 2 weeks.   Specialty:  Orthopedic Surgery   Contact information:   [place] [address] [phone]       Follow up with [facility]. (Someone from [facility] will contact you regarding start date and time for physical therapy.)    Contact information:   [place] [address] [phone]        Signed: Johnny Bridge 05/17/2014, 7:00 AM

## 2014-05-17 NOTE — Progress Notes (Signed)
Physical Therapy Treatment Patient Details Name: Joe [name] MRN: [id number] DOB: 25-Oct-1955 Today's Date: 05/17/2014    History of Present Illness 58 y.o. male s/p right total hip arthroplasty. Hx of cognitive deficits and recent (april 2015) left THA.    PT Comments    Patient continues to progress well towards physical therapy goals, ambulating up to 175 feet at a supervision level while using a rolling walker. Has already completed stair training and reports he feels ready to go home. Mother present during therapy session and all education has been reviewed. Feel patient is adequate for d/c from a mobility standpoint when medically ready. Patient will continue to benefit from skilled physical therapy services at home with HHPT to further improve independence with functional mobility.    Follow Up Recommendations  Home health PT;Supervision for mobility/OOB     Equipment Recommendations  None recommended by PT    Recommendations for Other Services OT consult     Precautions / Restrictions Precautions Precautions: Posterior Hip Precaution Booklet Issued: Yes (comment) Precaution Comments: Reviewed posterior hip precautions Restrictions Weight Bearing Restrictions: Yes RLE Weight Bearing: Weight bearing as tolerated    Mobility  Bed Mobility                  Transfers Overall transfer level: Needs assistance Equipment used: Rolling walker (2 wheeled) Transfers: Sit to/from Stand Sit to Stand: Supervision         General transfer comment: Supervision for safety. Correctly places hand on stable surface. Able to bring RLE forward to maintain Posterior hip precautions with sit<>stand.  Ambulation/Gait Ambulation/Gait assistance: Supervision Ambulation Distance (Feet): 175 Feet (additional bout of 150 feet) Assistive device: Rolling walker (2 wheeled) Gait Pattern/deviations: Step-to pattern;Step-through pattern;Decreased step length - left;Decreased stance  time - right;Antalgic   Gait velocity interpretation: Below normal speed for age/gender General Gait Details: Initially using step-to gait pattern due to pain, but quickly progressed to step-though pattern with VC. Additional cues for forward gaze and upright posture. Walker height adjusted to more appropriate level. No loss of balance during bouts.   Stairs            Wheelchair Mobility    Modified Rankin (Stroke Patients Only)       Balance                                    Cognition Arousal/Alertness: Awake/alert Behavior During Therapy: WFL for tasks assessed/performed Overall Cognitive Status: History of cognitive impairments - at baseline                      Exercises Total Joint Exercises Ankle Circles/Pumps: AROM;Both;10 reps;Seated Quad Sets: AROM;Both;10 reps;Seated Gluteal Sets: Strengthening;Both;10 reps Short Arc Quad: AROM;Right;10 reps;Supine Heel Slides: AAROM;Right;10 reps;Seated Hip ABduction/ADduction: AAROM;Right;10 reps;Supine Straight Leg Raises: AAROM;Right;10 reps;Supine Long Arc Quad: Strengthening;Right;10 reps;Seated    General Comments        Pertinent Vitals/Pain 4/10 pain Patient repositioned in chair for comfort.     Home Living                      Prior Function            PT Goals (current goals can now be found in the care plan section) Acute Rehab PT Goals PT Goal Formulation: With patient/family Time For Goal Achievement: 05/22/14 Potential to Achieve Goals: Good Progress towards PT  goals: Progressing toward goals    Frequency  7X/week    PT Plan Current plan remains appropriate    Co-evaluation             End of Session   Activity Tolerance: Patient tolerated treatment well Patient left: in chair;with call bell/phone within reach     Time: 0900-0926 PT Time Calculation (min): 26 min  Charges:  $Gait Training: 8-22 mins $Therapeutic Exercise: 8-22 mins                     G Codes:      [employer], [place]  [name] 05/17/2014, 9:56 AM

## 2014-05-17 NOTE — Progress Notes (Signed)
Discharge instructions reviewed with patient and patient's mother.  Patient has all equipment and is set up with [facility].  Rx x 1 given.  All medications and discharge instructions reviewed.  All questions answered.  Pt d/c via wheelchair to home in stable condition.

## 2015-03-19 DIAGNOSIS — K5900 Constipation, unspecified: Secondary | ICD-10-CM | POA: Diagnosis not present

## 2015-03-19 DIAGNOSIS — K625 Hemorrhage of anus and rectum: Secondary | ICD-10-CM | POA: Diagnosis not present

## 2015-03-19 DIAGNOSIS — Z1211 Encounter for screening for malignant neoplasm of colon: Secondary | ICD-10-CM | POA: Diagnosis not present

## 2015-03-29 DIAGNOSIS — H2513 Age-related nuclear cataract, bilateral: Secondary | ICD-10-CM | POA: Diagnosis not present

## 2015-03-29 DIAGNOSIS — H10413 Chronic giant papillary conjunctivitis, bilateral: Secondary | ICD-10-CM | POA: Diagnosis not present

## 2015-03-29 DIAGNOSIS — H04123 Dry eye syndrome of bilateral lacrimal glands: Secondary | ICD-10-CM | POA: Diagnosis not present

## 2015-05-24 DIAGNOSIS — Z0000 Encounter for general adult medical examination without abnormal findings: Secondary | ICD-10-CM | POA: Diagnosis not present

## 2015-05-24 DIAGNOSIS — Z1211 Encounter for screening for malignant neoplasm of colon: Secondary | ICD-10-CM | POA: Diagnosis not present

## 2015-05-24 DIAGNOSIS — H6122 Impacted cerumen, left ear: Secondary | ICD-10-CM | POA: Diagnosis not present

## 2015-09-09 DIAGNOSIS — Z1211 Encounter for screening for malignant neoplasm of colon: Secondary | ICD-10-CM | POA: Diagnosis not present

## 2015-09-09 DIAGNOSIS — Z1212 Encounter for screening for malignant neoplasm of rectum: Secondary | ICD-10-CM | POA: Diagnosis not present

## 2015-09-23 DIAGNOSIS — M25512 Pain in left shoulder: Secondary | ICD-10-CM | POA: Diagnosis not present

## 2015-09-23 DIAGNOSIS — M25511 Pain in right shoulder: Secondary | ICD-10-CM | POA: Diagnosis not present

## 2015-09-23 DIAGNOSIS — M542 Cervicalgia: Secondary | ICD-10-CM | POA: Diagnosis not present

## 2015-09-23 DIAGNOSIS — M25551 Pain in right hip: Secondary | ICD-10-CM | POA: Diagnosis not present

## 2016-07-06 DIAGNOSIS — H9312 Tinnitus, left ear: Secondary | ICD-10-CM | POA: Diagnosis not present

## 2016-07-06 DIAGNOSIS — J0100 Acute maxillary sinusitis, unspecified: Secondary | ICD-10-CM | POA: Diagnosis not present

## 2016-07-06 DIAGNOSIS — H6122 Impacted cerumen, left ear: Secondary | ICD-10-CM | POA: Diagnosis not present

## 2016-08-04 DIAGNOSIS — J302 Other seasonal allergic rhinitis: Secondary | ICD-10-CM | POA: Diagnosis not present

## 2016-08-04 DIAGNOSIS — H903 Sensorineural hearing loss, bilateral: Secondary | ICD-10-CM | POA: Diagnosis not present

## 2016-08-04 DIAGNOSIS — H9312 Tinnitus, left ear: Secondary | ICD-10-CM | POA: Diagnosis not present

## 2016-08-04 DIAGNOSIS — H93233 Hyperacusis, bilateral: Secondary | ICD-10-CM | POA: Diagnosis not present

## 2016-09-09 DIAGNOSIS — E559 Vitamin D deficiency, unspecified: Secondary | ICD-10-CM | POA: Diagnosis not present

## 2016-09-09 DIAGNOSIS — Z0000 Encounter for general adult medical examination without abnormal findings: Secondary | ICD-10-CM | POA: Diagnosis not present

## 2016-09-09 DIAGNOSIS — R238 Other skin changes: Secondary | ICD-10-CM | POA: Diagnosis not present

## 2016-09-23 DIAGNOSIS — M25551 Pain in right hip: Secondary | ICD-10-CM | POA: Diagnosis not present

## 2016-09-23 DIAGNOSIS — M25511 Pain in right shoulder: Secondary | ICD-10-CM | POA: Diagnosis not present

## 2016-09-23 DIAGNOSIS — M25512 Pain in left shoulder: Secondary | ICD-10-CM | POA: Diagnosis not present

## 2016-12-17 DIAGNOSIS — K219 Gastro-esophageal reflux disease without esophagitis: Secondary | ICD-10-CM | POA: Diagnosis not present

## 2016-12-17 DIAGNOSIS — Z86010 Personal history of colonic polyps: Secondary | ICD-10-CM | POA: Diagnosis not present

## 2016-12-17 DIAGNOSIS — R1033 Periumbilical pain: Secondary | ICD-10-CM | POA: Diagnosis not present

## 2016-12-18 ENCOUNTER — Other Ambulatory Visit: Payer: Self-pay | Admitting: Gastroenterology

## 2016-12-18 DIAGNOSIS — R1033 Periumbilical pain: Secondary | ICD-10-CM

## 2016-12-24 ENCOUNTER — Other Ambulatory Visit: Payer: Self-pay

## 2017-01-20 DIAGNOSIS — Z1211 Encounter for screening for malignant neoplasm of colon: Secondary | ICD-10-CM | POA: Diagnosis not present

## 2017-01-20 DIAGNOSIS — Z86010 Personal history of colonic polyps: Secondary | ICD-10-CM | POA: Diagnosis not present

## 2017-01-20 DIAGNOSIS — D123 Benign neoplasm of transverse colon: Secondary | ICD-10-CM | POA: Diagnosis not present

## 2017-01-20 DIAGNOSIS — K635 Polyp of colon: Secondary | ICD-10-CM | POA: Diagnosis not present

## 2017-01-20 DIAGNOSIS — K5730 Diverticulosis of large intestine without perforation or abscess without bleeding: Secondary | ICD-10-CM | POA: Diagnosis not present

## 2017-03-24 DIAGNOSIS — H2513 Age-related nuclear cataract, bilateral: Secondary | ICD-10-CM | POA: Diagnosis not present

## 2017-03-24 DIAGNOSIS — H43393 Other vitreous opacities, bilateral: Secondary | ICD-10-CM | POA: Diagnosis not present

## 2017-03-24 DIAGNOSIS — H04123 Dry eye syndrome of bilateral lacrimal glands: Secondary | ICD-10-CM | POA: Diagnosis not present

## 2017-03-24 DIAGNOSIS — H10413 Chronic giant papillary conjunctivitis, bilateral: Secondary | ICD-10-CM | POA: Diagnosis not present

## 2017-03-24 DIAGNOSIS — H43811 Vitreous degeneration, right eye: Secondary | ICD-10-CM | POA: Diagnosis not present

## 2017-04-01 DIAGNOSIS — Z86010 Personal history of colonic polyps: Secondary | ICD-10-CM | POA: Diagnosis not present

## 2017-04-01 DIAGNOSIS — K5904 Chronic idiopathic constipation: Secondary | ICD-10-CM | POA: Diagnosis not present

## 2017-04-01 DIAGNOSIS — K219 Gastro-esophageal reflux disease without esophagitis: Secondary | ICD-10-CM | POA: Diagnosis not present

## 2017-04-02 ENCOUNTER — Other Ambulatory Visit: Payer: Self-pay | Admitting: Gastroenterology

## 2017-04-02 DIAGNOSIS — Q438 Other specified congenital malformations of intestine: Secondary | ICD-10-CM

## 2017-04-20 ENCOUNTER — Ambulatory Visit
Admission: RE | Admit: 2017-04-20 | Discharge: 2017-04-20 | Disposition: A | Discharge status: Home / Self Care | Payer: Medicare Other | Source: Ambulatory Visit | Attending: Gastroenterology | Admitting: Gastroenterology

## 2017-04-20 DIAGNOSIS — Q438 Other specified congenital malformations of intestine: Secondary | ICD-10-CM | POA: Diagnosis not present

## 2017-08-18 ENCOUNTER — Other Ambulatory Visit: Payer: Self-pay | Admitting: Orthopedic Surgery

## 2017-08-18 DIAGNOSIS — Z96643 Presence of artificial hip joint, bilateral: Secondary | ICD-10-CM | POA: Diagnosis not present

## 2017-08-18 DIAGNOSIS — M25512 Pain in left shoulder: Secondary | ICD-10-CM

## 2017-08-18 DIAGNOSIS — M25511 Pain in right shoulder: Secondary | ICD-10-CM | POA: Diagnosis not present

## 2017-08-18 DIAGNOSIS — M19012 Primary osteoarthritis, left shoulder: Secondary | ICD-10-CM | POA: Diagnosis not present

## 2017-08-23 ENCOUNTER — Ambulatory Visit
Admission: RE | Admit: 2017-08-23 | Discharge: 2017-08-23 | Disposition: A | Discharge status: Home / Self Care | Payer: Medicare Other | Source: Ambulatory Visit | Attending: Orthopedic Surgery | Admitting: Orthopedic Surgery

## 2017-08-23 DIAGNOSIS — M25512 Pain in left shoulder: Secondary | ICD-10-CM

## 2017-08-23 DIAGNOSIS — Z471 Aftercare following joint replacement surgery: Secondary | ICD-10-CM | POA: Diagnosis not present

## 2017-08-23 DIAGNOSIS — Z96612 Presence of left artificial shoulder joint: Secondary | ICD-10-CM | POA: Diagnosis not present

## 2017-09-08 ENCOUNTER — Other Ambulatory Visit: Payer: Self-pay | Admitting: Orthopedic Surgery

## 2017-09-10 ENCOUNTER — Other Ambulatory Visit: Payer: Self-pay

## 2017-09-10 ENCOUNTER — Encounter (HOSPITAL_COMMUNITY)
Admission: RE | Admit: 2017-09-10 | Discharge: 2017-09-10 | Disposition: A | Discharge status: Home / Self Care | Payer: Medicare Other | Source: Ambulatory Visit | Attending: Ophthalmology | Admitting: Ophthalmology

## 2017-09-10 ENCOUNTER — Encounter (HOSPITAL_COMMUNITY): Payer: Self-pay

## 2017-09-10 DIAGNOSIS — Z01810 Encounter for preprocedural cardiovascular examination: Secondary | ICD-10-CM | POA: Insufficient documentation

## 2017-09-10 DIAGNOSIS — Z01812 Encounter for preprocedural laboratory examination: Secondary | ICD-10-CM | POA: Insufficient documentation

## 2017-09-10 DIAGNOSIS — M4326 Fusion of spine, lumbar region: Secondary | ICD-10-CM | POA: Diagnosis not present

## 2017-09-10 DIAGNOSIS — Z9889 Other specified postprocedural states: Secondary | ICD-10-CM | POA: Diagnosis not present

## 2017-09-10 DIAGNOSIS — Q049 Congenital malformation of brain, unspecified: Secondary | ICD-10-CM | POA: Insufficient documentation

## 2017-09-10 DIAGNOSIS — F809 Developmental disorder of speech and language, unspecified: Secondary | ICD-10-CM | POA: Diagnosis not present

## 2017-09-10 DIAGNOSIS — F79 Unspecified intellectual disabilities: Secondary | ICD-10-CM | POA: Insufficient documentation

## 2017-09-10 LAB — SURGICAL PCR SCREEN
MRSA, PCR: NEGATIVE
STAPHYLOCOCCUS AUREUS: NEGATIVE

## 2017-09-10 LAB — BASIC METABOLIC PANEL
Anion gap: 7 (ref 5–15)
BUN: 15 mg/dL (ref 6–20)
CHLORIDE: 106 mmol/L (ref 101–111)
CO2: 24 mmol/L (ref 22–32)
CREATININE: 0.77 mg/dL (ref 0.61–1.24)
Calcium: 9.7 mg/dL (ref 8.9–10.3)
GFR calc Af Amer: >60 mL/min (ref >60)
GLUCOSE: 94 mg/dL (ref 65–99)
Potassium: 4.3 mmol/L (ref 3.5–5.1)
SODIUM: 137 mmol/L (ref 135–145)

## 2017-09-10 LAB — CBC
HEMATOCRIT: 40.6 % (ref 39.0–52.0)
Hemoglobin: 13.8 g/dL (ref 13.0–17.0)
MCH: 33.3 pg (ref 26.0–34.0)
MCHC: 34 g/dL (ref 30.0–36.0)
MCV: 97.8 fL (ref 78.0–100.0)
PLATELETS: 242 10*3/uL (ref 150–400)
RBC: 4.15 MIL/uL — ABNORMAL LOW (ref 4.22–5.81)
RDW: 13.7 % (ref 11.5–15.5)
WBC: 7.4 10*3/uL (ref 4.0–10.5)

## 2017-09-10 NOTE — Pre-Procedure Instructions (Signed)
Joe [name]  09/10/2017      [facility], [place] - [place] AT [address] [place] [address] Phone: [phone] Fax: [phone]    Your procedure is scheduled on 09-21-2017 Tuesday   Report to [facility] Admitting at 9:30 A.M.   Call this number if you have problems the morning of surgery:  [phone]   Remember:  Do not eat food or drink liquids after midnight.   Take these medicines the morning of surgery with A SIP OF WATER Tylenol if needed omeprazole(Prilosec)  STOP ASPIRIN,ANTIINFLAMATORIES (IBUPROFEN,ALEVE,MOTRIN,ADVIL,GOODY'S POWDERS),HERBAL SUPPLEMENTS,FISH OIL,AND VITAMINS 5-7 DAYS PRIOR TO SURGERY   Do not wear jewelry, .  Do not wear lotions, powders, or perfumes, or deoderant.  Do not shave 48 hours prior to surgery.  Men may shave face and neck.  Do not bring valuables to the hospital.   [name] is not responsible for any belongings or valuables.  Contacts, dentures or bridgework may not be worn into surgery.  Leave your suitcase in the car.  After surgery it may be brought to your room.  For patients admitted to the hospital, discharge time will be determined by your treatment team.  Patients discharged the day of surgery will not be allowed to drive home.    Special Instructions: [name] - Preparing for Surgery  Before surgery, you can play an important role.  Because skin is not sterile, your skin needs to be as free of germs as possible.  You can reduce the number of germs on you skin by washing with CHG (chlorahexidine gluconate) soap before surgery.  CHG is an antiseptic cleaner which kills germs and bonds with the skin to continue killing germs even after washing.  Please DO NOT use if you have an allergy to CHG or antibacterial soaps.  If your skin becomes reddened/irritated stop using the CHG and inform your nurse when you arrive at Short Stay.  Do  not shave (including legs and underarms) for at least 48 hours prior to the first CHG shower.  You may shave your face.  Please follow these instructions carefully:   1.  Shower with CHG Soap the night before surgery and the   morning of Surgery.  2.  If you choose to wash your hair, wash your hair first as usual with your normal shampoo.  3.  After you shampoo, rinse your hair and body thoroughly to remove the  Shampoo.  4.  Use CHG as you would any other liquid soap.  You can apply chg directly  to the skin and wash gently with scrungie or a clean washcloth.  5.  Apply the CHG Soap to your body ONLY FROM THE NECK DOWN.   Do not use on open wounds or open sores.  Avoid contact with your eyes,  ears, mouth and genitals (private parts).  Wash genitals (private parts) with your normal soap.  6.  Wash thoroughly, paying special attention to the area where your surgery will be performed.  7.  Thoroughly rinse your body with warm water from the neck down.  8.  DO NOT shower/wash with your normal soap after using and rinsing o  the CHG Soap.  9.  Pat yourself dry with a clean towel.            10.  Wear clean pajamas.  11.  Place clean sheets on your bed the night of your first shower and do not sleep with pets.  Day of Surgery  Do not apply any lotions/deodorants the morning of surgery.  Please wear clean clothes to the hospital/surgery center.   Please read over the following fact sheets that you were given. MRSA Information and Surgical Site Infection Prevention

## 2017-09-10 NOTE — Progress Notes (Signed)
Mother requested Dr. Conrad [place] as anesthesiologist.  OR scheduler notified

## 2017-09-13 NOTE — Progress Notes (Signed)
Anesthesia Chart Review:  Mother (caregiver) requests Dr. Conrad [place] be anesthesiologist.   Pt is a 61 year old male scheduled for L total shoulder arthroplasty on 09/21/2017 with Marchia Bond, MD  PMH includes:  Congenital brain damage, mental retardation, expressive speech delay. Never smoker. BMI 26.  - S/p R THA 05/15/14. S/p L THA 01/16/14. S/p lumbar fusion 08/27/11.   Medications reviewed  BP (!) 143/57   Pulse 61   Temp 36.9 C (Oral)   Resp 18   Ht 5\' 6"  (1.676 m)   Wt 159 lb 2 oz (72.2 kg)   SpO2 99%   BMI 25.68 kg/m   Preoperative labs reviewed.     EKG 09/10/17: Sinus bradycardia (56 bpm) with 1st degree A-V block. LVH with QRS widening and repolarization abnormality Possible Inferior infarct, age undetermined  Reviewed case with Dr. Deatra Canter.   If no changes, I anticipate pt can proceed with surgery as scheduled.   Willeen Cass, FNP-BC [facility]/Anesthesiology Phone: [phone] 09/13/2017 3:48 PM

## 2017-09-15 DIAGNOSIS — Z0000 Encounter for general adult medical examination without abnormal findings: Secondary | ICD-10-CM | POA: Diagnosis not present

## 2017-09-15 DIAGNOSIS — E7800 Pure hypercholesterolemia, unspecified: Secondary | ICD-10-CM | POA: Diagnosis not present

## 2017-09-15 DIAGNOSIS — J302 Other seasonal allergic rhinitis: Secondary | ICD-10-CM | POA: Diagnosis not present

## 2017-09-15 DIAGNOSIS — H6123 Impacted cerumen, bilateral: Secondary | ICD-10-CM | POA: Diagnosis not present

## 2017-09-15 DIAGNOSIS — M1990 Unspecified osteoarthritis, unspecified site: Secondary | ICD-10-CM | POA: Diagnosis not present

## 2017-09-21 ENCOUNTER — Encounter (HOSPITAL_COMMUNITY)
Admission: RE | Disposition: A | Discharge status: Home / Self Care | Payer: Self-pay | Source: Ambulatory Visit | Attending: Orthopedic Surgery

## 2017-09-21 ENCOUNTER — Inpatient Hospital Stay (HOSPITAL_COMMUNITY): Payer: Medicare Other | Admitting: Emergency Medicine

## 2017-09-21 ENCOUNTER — Inpatient Hospital Stay (HOSPITAL_COMMUNITY)
Admission: RE | Admit: 2017-09-21 | Discharge: 2017-09-22 | DRG: 483 | Disposition: A | Discharge status: Home / Self Care | Payer: Medicare Other | Source: Ambulatory Visit | Attending: Orthopedic Surgery | Admitting: Orthopedic Surgery

## 2017-09-21 ENCOUNTER — Encounter (HOSPITAL_COMMUNITY): Payer: Self-pay

## 2017-09-21 ENCOUNTER — Other Ambulatory Visit: Payer: Self-pay

## 2017-09-21 ENCOUNTER — Inpatient Hospital Stay (HOSPITAL_COMMUNITY): Payer: Medicare Other

## 2017-09-21 DIAGNOSIS — Z96619 Presence of unspecified artificial shoulder joint: Secondary | ICD-10-CM

## 2017-09-21 DIAGNOSIS — G8918 Other acute postprocedural pain: Secondary | ICD-10-CM | POA: Diagnosis not present

## 2017-09-21 DIAGNOSIS — Z885 Allergy status to narcotic agent status: Secondary | ICD-10-CM

## 2017-09-21 DIAGNOSIS — F79 Unspecified intellectual disabilities: Secondary | ICD-10-CM | POA: Diagnosis present

## 2017-09-21 DIAGNOSIS — F801 Expressive language disorder: Secondary | ICD-10-CM | POA: Diagnosis present

## 2017-09-21 DIAGNOSIS — F419 Anxiety disorder, unspecified: Secondary | ICD-10-CM | POA: Diagnosis present

## 2017-09-21 DIAGNOSIS — M19012 Primary osteoarthritis, left shoulder: Secondary | ICD-10-CM | POA: Diagnosis not present

## 2017-09-21 DIAGNOSIS — Z881 Allergy status to other antibiotic agents status: Secondary | ICD-10-CM

## 2017-09-21 DIAGNOSIS — M25712 Osteophyte, left shoulder: Secondary | ICD-10-CM | POA: Diagnosis not present

## 2017-09-21 DIAGNOSIS — R4189 Other symptoms and signs involving cognitive functions and awareness: Secondary | ICD-10-CM | POA: Diagnosis present

## 2017-09-21 DIAGNOSIS — Z471 Aftercare following joint replacement surgery: Secondary | ICD-10-CM | POA: Diagnosis not present

## 2017-09-21 DIAGNOSIS — Z981 Arthrodesis status: Secondary | ICD-10-CM | POA: Diagnosis not present

## 2017-09-21 DIAGNOSIS — Z79899 Other long term (current) drug therapy: Secondary | ICD-10-CM

## 2017-09-21 DIAGNOSIS — M160 Bilateral primary osteoarthritis of hip: Secondary | ICD-10-CM | POA: Diagnosis not present

## 2017-09-21 DIAGNOSIS — Z7901 Long term (current) use of anticoagulants: Secondary | ICD-10-CM

## 2017-09-21 DIAGNOSIS — Z96611 Presence of right artificial shoulder joint: Secondary | ICD-10-CM | POA: Diagnosis present

## 2017-09-21 DIAGNOSIS — Z96612 Presence of left artificial shoulder joint: Secondary | ICD-10-CM | POA: Diagnosis not present

## 2017-09-21 DIAGNOSIS — M19019 Primary osteoarthritis, unspecified shoulder: Secondary | ICD-10-CM | POA: Diagnosis present

## 2017-09-21 DIAGNOSIS — Z96643 Presence of artificial hip joint, bilateral: Secondary | ICD-10-CM | POA: Diagnosis present

## 2017-09-21 DIAGNOSIS — R339 Retention of urine, unspecified: Secondary | ICD-10-CM | POA: Diagnosis not present

## 2017-09-21 HISTORY — DX: Primary osteoarthritis, left shoulder: M19.012

## 2017-09-21 HISTORY — PX: SHOULDER HEMI-ARTHROPLASTY: SHX5049

## 2017-09-21 SURGERY — HEMIARTHROPLASTY, SHOULDER
Anesthesia: General | Laterality: Left

## 2017-09-21 MED ORDER — PHENOL 1.4 % MT LIQD: 1.0000 | OROMUCOSAL | Status: DC | PRN

## 2017-09-21 MED ORDER — FENTANYL CITRATE (PF) 250 MCG/5ML IJ SOLN
INTRAMUSCULAR | Status: DC | PRN
  Administered 2017-09-21 (×2): 50 ug via INTRAVENOUS

## 2017-09-21 MED ORDER — GLYCOPYRROLATE 0.2 MG/ML IJ SOLN
INTRAMUSCULAR | Status: DC | PRN
  Administered 2017-09-21: 0.2 mg via INTRAVENOUS

## 2017-09-21 MED ORDER — ROCURONIUM BROMIDE 10 MG/ML (PF) SYRINGE
PREFILLED_SYRINGE | INTRAVENOUS | Status: AC
  Filled 2017-09-21: qty 15

## 2017-09-21 MED ORDER — EPHEDRINE 5 MG/ML INJ
INTRAVENOUS | Status: AC
  Filled 2017-09-21: qty 10

## 2017-09-21 MED ORDER — ROCURONIUM BROMIDE 10 MG/ML (PF) SYRINGE
PREFILLED_SYRINGE | INTRAVENOUS | Status: AC
  Filled 2017-09-21: qty 5

## 2017-09-21 MED ORDER — PANTOPRAZOLE SODIUM 40 MG PO TBEC
80.0000 mg | DELAYED_RELEASE_TABLET | Freq: Every day | ORAL | Status: DC
  Administered 2017-09-21: 80 mg via ORAL
  Filled 2017-09-21: qty 2

## 2017-09-21 MED ORDER — 0.9 % SODIUM CHLORIDE (POUR BTL) OPTIME
TOPICAL | Status: DC | PRN
  Administered 2017-09-21: 1000 mL

## 2017-09-21 MED ORDER — SENNA 8.6 MG PO TABS
1.0000 | ORAL_TABLET | Freq: Two times a day (BID) | ORAL | Status: DC
  Administered 2017-09-21: 8.6 mg via ORAL
  Filled 2017-09-21: qty 1

## 2017-09-21 MED ORDER — METOCLOPRAMIDE HCL 5 MG PO TABS: 5.0000 mg | ORAL_TABLET | Freq: Three times a day (TID) | ORAL | Status: DC | PRN

## 2017-09-21 MED ORDER — OXYCODONE HCL 5 MG PO TABS: 5.0000 mg | ORAL_TABLET | ORAL | 0 refills | Status: DC | PRN

## 2017-09-21 MED ORDER — OXYCODONE HCL 5 MG PO TABS
10.0000 mg | ORAL_TABLET | ORAL | Status: DC | PRN
  Administered 2017-09-21: 10 mg via ORAL
  Filled 2017-09-21: qty 2

## 2017-09-21 MED ORDER — FENTANYL CITRATE (PF) 100 MCG/2ML IJ SOLN
50.0000 ug | Freq: Once | INTRAMUSCULAR | Status: AC
  Administered 2017-09-21: 50 ug via INTRAVENOUS
  Filled 2017-09-21: qty 1

## 2017-09-21 MED ORDER — HYDROMORPHONE HCL 1 MG/ML IJ SOLN: 0.2500 mg | INTRAMUSCULAR | Status: DC | PRN

## 2017-09-21 MED ORDER — LIDOCAINE 2% (20 MG/ML) 5 ML SYRINGE
INTRAMUSCULAR | Status: AC
  Filled 2017-09-21: qty 15

## 2017-09-21 MED ORDER — ONDANSETRON HCL 4 MG PO TABS: 4.0000 mg | ORAL_TABLET | Freq: Three times a day (TID) | ORAL | 0 refills | Status: DC | PRN

## 2017-09-21 MED ORDER — VITAMIN C 500 MG PO TABS: 500.0000 mg | ORAL_TABLET | Freq: Every day | ORAL | Status: DC

## 2017-09-21 MED ORDER — ONDANSETRON HCL 4 MG PO TABS: 4.0000 mg | ORAL_TABLET | Freq: Four times a day (QID) | ORAL | Status: DC | PRN

## 2017-09-21 MED ORDER — DOCUSATE SODIUM 100 MG PO CAPS
100.0000 mg | ORAL_CAPSULE | Freq: Two times a day (BID) | ORAL | Status: DC
  Administered 2017-09-21: 100 mg via ORAL
  Filled 2017-09-21: qty 1

## 2017-09-21 MED ORDER — PROPOFOL 10 MG/ML IV BOLUS
INTRAVENOUS | Status: AC
  Filled 2017-09-21: qty 20

## 2017-09-21 MED ORDER — DIPHENHYDRAMINE HCL 12.5 MG/5ML PO ELIX: 12.5000 mg | ORAL_SOLUTION | ORAL | Status: DC | PRN

## 2017-09-21 MED ORDER — BACLOFEN 10 MG PO TABS: 10.0000 mg | ORAL_TABLET | Freq: Three times a day (TID) | ORAL | 0 refills | Status: DC

## 2017-09-21 MED ORDER — CHLORHEXIDINE GLUCONATE 4 % EX LIQD: 60.0000 mL | Freq: Once | CUTANEOUS | Status: DC

## 2017-09-21 MED ORDER — ROCURONIUM BROMIDE 10 MG/ML (PF) SYRINGE
PREFILLED_SYRINGE | INTRAVENOUS | Status: DC | PRN
  Administered 2017-09-21: 20 mg via INTRAVENOUS
  Administered 2017-09-21: 30 mg via INTRAVENOUS
  Administered 2017-09-21: 20 mg via INTRAVENOUS
  Administered 2017-09-21: 50 mg via INTRAVENOUS

## 2017-09-21 MED ORDER — ONDANSETRON HCL 4 MG/2ML IJ SOLN
INTRAMUSCULAR | Status: DC | PRN
  Administered 2017-09-21: 4 mg via INTRAVENOUS

## 2017-09-21 MED ORDER — ROCURONIUM BROMIDE 10 MG/ML (PF) SYRINGE
PREFILLED_SYRINGE | INTRAVENOUS | Status: AC
  Filled 2017-09-21: qty 20

## 2017-09-21 MED ORDER — MAGNESIUM CITRATE PO SOLN: 1.0000 | Freq: Once | ORAL | Status: DC | PRN

## 2017-09-21 MED ORDER — METHOCARBAMOL 500 MG PO TABS
ORAL_TABLET | ORAL | Status: AC
  Filled 2017-09-21: qty 1

## 2017-09-21 MED ORDER — SUGAMMADEX SODIUM 200 MG/2ML IV SOLN
INTRAVENOUS | Status: DC | PRN
  Administered 2017-09-21: 200 mg via INTRAVENOUS

## 2017-09-21 MED ORDER — ACETAMINOPHEN 500 MG PO TABS
1000.0000 mg | ORAL_TABLET | Freq: Four times a day (QID) | ORAL | Status: DC
  Administered 2017-09-21 – 2017-09-22 (×3): 1000 mg via ORAL
  Filled 2017-09-21 (×3): qty 2

## 2017-09-21 MED ORDER — ZOLPIDEM TARTRATE 5 MG PO TABS: 5.0000 mg | ORAL_TABLET | Freq: Every evening | ORAL | Status: DC | PRN

## 2017-09-21 MED ORDER — BISACODYL 10 MG RE SUPP: 10.0000 mg | Freq: Every day | RECTAL | Status: DC | PRN

## 2017-09-21 MED ORDER — DEXAMETHASONE SODIUM PHOSPHATE 10 MG/ML IJ SOLN
INTRAMUSCULAR | Status: DC | PRN
  Administered 2017-09-21: 5 mg via INTRAVENOUS

## 2017-09-21 MED ORDER — OXYCODONE HCL 5 MG PO TABS
5.0000 mg | ORAL_TABLET | ORAL | Status: DC | PRN
  Administered 2017-09-21: 5 mg via ORAL

## 2017-09-21 MED ORDER — LIDOCAINE 2% (20 MG/ML) 5 ML SYRINGE
INTRAMUSCULAR | Status: AC
  Filled 2017-09-21: qty 10

## 2017-09-21 MED ORDER — DEXAMETHASONE SODIUM PHOSPHATE 10 MG/ML IJ SOLN
INTRAMUSCULAR | Status: AC
  Filled 2017-09-21: qty 3

## 2017-09-21 MED ORDER — METHOCARBAMOL 1000 MG/10ML IJ SOLN: 500.0000 mg | Freq: Four times a day (QID) | INTRAVENOUS | Status: DC | PRN

## 2017-09-21 MED ORDER — BUPIVACAINE-EPINEPHRINE (PF) 0.5% -1:200000 IJ SOLN
INTRAMUSCULAR | Status: DC | PRN
  Administered 2017-09-21: 30 mL via PERINEURAL

## 2017-09-21 MED ORDER — FENTANYL CITRATE (PF) 250 MCG/5ML IJ SOLN
INTRAMUSCULAR | Status: AC
  Filled 2017-09-21: qty 5

## 2017-09-21 MED ORDER — MIDAZOLAM HCL 2 MG/2ML IJ SOLN
2.0000 mg | Freq: Once | INTRAMUSCULAR | Status: AC
  Administered 2017-09-21: 2 mg via INTRAVENOUS
  Filled 2017-09-21: qty 2

## 2017-09-21 MED ORDER — POLYETHYLENE GLYCOL 3350 17 G PO PACK: 17.0000 g | PACK | Freq: Every day | ORAL | Status: DC | PRN

## 2017-09-21 MED ORDER — EPHEDRINE SULFATE 50 MG/ML IJ SOLN
INTRAMUSCULAR | Status: DC | PRN
  Administered 2017-09-21: 10 mg via INTRAVENOUS

## 2017-09-21 MED ORDER — SENNA-DOCUSATE SODIUM 8.6-50 MG PO TABS: 2.0000 | ORAL_TABLET | Freq: Every day | ORAL | 1 refills | Status: DC

## 2017-09-21 MED ORDER — CEFAZOLIN SODIUM-DEXTROSE 1-4 GM/50ML-% IV SOLN
1.0000 g | Freq: Four times a day (QID) | INTRAVENOUS | Status: AC
  Administered 2017-09-21 – 2017-09-22 (×3): 1 g via INTRAVENOUS
  Filled 2017-09-21 (×3): qty 50

## 2017-09-21 MED ORDER — CEFAZOLIN SODIUM-DEXTROSE 2-4 GM/100ML-% IV SOLN
2.0000 g | INTRAVENOUS | Status: AC
  Administered 2017-09-21: 2 g via INTRAVENOUS
  Filled 2017-09-21: qty 100

## 2017-09-21 MED ORDER — CALCIUM CARBONATE-VITAMIN D 500-200 MG-UNIT PO TABS
2.0000 | ORAL_TABLET | Freq: Every day | ORAL | Status: DC
Start: 2017-09-22 — End: 2017-09-22

## 2017-09-21 MED ORDER — LIDOCAINE 2% (20 MG/ML) 5 ML SYRINGE
INTRAMUSCULAR | Status: DC | PRN
  Administered 2017-09-21: 40 mg via INTRAVENOUS

## 2017-09-21 MED ORDER — ONDANSETRON HCL 4 MG/2ML IJ SOLN: 4.0000 mg | Freq: Once | INTRAMUSCULAR | Status: DC | PRN

## 2017-09-21 MED ORDER — LACTATED RINGERS IV SOLN
INTRAVENOUS | Status: DC
  Administered 2017-09-21 (×2): via INTRAVENOUS

## 2017-09-21 MED ORDER — FENTANYL CITRATE (PF) 100 MCG/2ML IJ SOLN
INTRAMUSCULAR | Status: AC
  Administered 2017-09-21: 50 ug via INTRAVENOUS
  Filled 2017-09-21: qty 2

## 2017-09-21 MED ORDER — PROPOFOL 1000 MG/100ML IV EMUL
INTRAVENOUS | Status: AC
  Filled 2017-09-21: qty 100

## 2017-09-21 MED ORDER — PHENYLEPHRINE HCL 10 MG/ML IJ SOLN
INTRAVENOUS | Status: DC | PRN
  Administered 2017-09-21: 50 ug/min via INTRAVENOUS

## 2017-09-21 MED ORDER — MENTHOL 3 MG MT LOZG: 1.0000 | LOZENGE | OROMUCOSAL | Status: DC | PRN

## 2017-09-21 MED ORDER — KETOROLAC TROMETHAMINE 15 MG/ML IJ SOLN
7.5000 mg | Freq: Four times a day (QID) | INTRAMUSCULAR | Status: DC
  Administered 2017-09-21 – 2017-09-22 (×3): 7.5 mg via INTRAVENOUS
  Filled 2017-09-21 (×3): qty 1

## 2017-09-21 MED ORDER — ACETAMINOPHEN 650 MG RE SUPP
650.0000 mg | RECTAL | Status: DC | PRN
Start: 2017-09-21 — End: 2017-09-22

## 2017-09-21 MED ORDER — MEPERIDINE HCL 25 MG/ML IJ SOLN: 6.2500 mg | INTRAMUSCULAR | Status: DC | PRN

## 2017-09-21 MED ORDER — ONDANSETRON HCL 4 MG/2ML IJ SOLN: 4.0000 mg | Freq: Four times a day (QID) | INTRAMUSCULAR | Status: DC | PRN

## 2017-09-21 MED ORDER — B-12 5000 MCG PO CAPS: 5000.0000 ug | ORAL_CAPSULE | Freq: Every day | ORAL | Status: DC

## 2017-09-21 MED ORDER — METOCLOPRAMIDE HCL 5 MG/ML IJ SOLN: 5.0000 mg | Freq: Three times a day (TID) | INTRAMUSCULAR | Status: DC | PRN

## 2017-09-21 MED ORDER — METHOCARBAMOL 500 MG PO TABS
500.0000 mg | ORAL_TABLET | Freq: Four times a day (QID) | ORAL | Status: DC | PRN
  Administered 2017-09-21: 500 mg via ORAL

## 2017-09-21 MED ORDER — DEXAMETHASONE SODIUM PHOSPHATE 10 MG/ML IJ SOLN
INTRAMUSCULAR | Status: AC
  Filled 2017-09-21: qty 4

## 2017-09-21 MED ORDER — OXYCODONE HCL 5 MG PO TABS
ORAL_TABLET | ORAL | Status: AC
  Filled 2017-09-21: qty 1

## 2017-09-21 MED ORDER — EPHEDRINE SULFATE-NACL 50-0.9 MG/10ML-% IV SOSY
PREFILLED_SYRINGE | INTRAVENOUS | Status: DC | PRN
  Administered 2017-09-21: 10 mg via INTRAVENOUS

## 2017-09-21 MED ORDER — PROPOFOL 10 MG/ML IV BOLUS
INTRAVENOUS | Status: DC | PRN
  Administered 2017-09-21: 130 mg via INTRAVENOUS

## 2017-09-21 MED ORDER — ONDANSETRON HCL 4 MG/2ML IJ SOLN
INTRAMUSCULAR | Status: AC
  Filled 2017-09-21: qty 10

## 2017-09-21 MED ORDER — ALUM & MAG HYDROXIDE-SIMETH 200-200-20 MG/5ML PO SUSP: 30.0000 mL | ORAL | Status: DC | PRN

## 2017-09-21 MED ORDER — HYDROMORPHONE HCL 1 MG/ML IJ SOLN: 0.5000 mg | INTRAMUSCULAR | Status: DC | PRN

## 2017-09-21 MED ORDER — MIDAZOLAM HCL 2 MG/2ML IJ SOLN
INTRAMUSCULAR | Status: AC
  Administered 2017-09-21: 2 mg via INTRAVENOUS
  Filled 2017-09-21: qty 2

## 2017-09-21 MED ORDER — POTASSIUM CHLORIDE IN NACL 20-0.45 MEQ/L-% IV SOLN
INTRAVENOUS | Status: DC
  Filled 2017-09-21: qty 1000

## 2017-09-21 MED ORDER — ACETAMINOPHEN 325 MG PO TABS: 650.0000 mg | ORAL_TABLET | ORAL | Status: DC | PRN

## 2017-09-21 MED ORDER — ONDANSETRON HCL 4 MG/2ML IJ SOLN
INTRAMUSCULAR | Status: AC
  Filled 2017-09-21: qty 6

## 2017-09-21 MED ORDER — SODIUM CHLORIDE 0.9 % IR SOLN
Status: DC | PRN
  Administered 2017-09-21: 3000 mL

## 2017-09-21 SURGICAL SUPPLY — 53 items
BIT DRILL 5/64X5 DISP (BIT) ×3 IMPLANT
BIT DRILL 7/64X5 DISP (BIT) ×3 IMPLANT
BIT DRILL QUICK RELEASE PRPHRL (DRILL) IMPLANT
BLADE SAW SGTL MED 73X18.5 STR (BLADE) ×3 IMPLANT
CAPT SHOULDER PARTIAL 2 ×3 IMPLANT
CEMENT BONE REFOBACIN R1X40 US (Cement) IMPLANT
CLOSURE STERI-STRIP 1/2X4 (GAUZE/BANDAGES/DRESSINGS) ×2
CLSR STERI-STRIP ANTIMIC 1/2X4 (GAUZE/BANDAGES/DRESSINGS) ×4 IMPLANT
COVER SURGICAL LIGHT HANDLE (MISCELLANEOUS) ×3 IMPLANT
DRAPE HALF SHEET 40X57 (DRAPES) ×3 IMPLANT
DRAPE ORTHO SPLIT 77X108 STRL (DRAPES) ×4
DRAPE SURG ORHT 6 SPLT 77X108 (DRAPES) ×2 IMPLANT
DRAPE U-SHAPE 47X51 STRL (DRAPES) ×3 IMPLANT
DRILL QUICK RELEASE PERIPHERAL (DRILL)
DRSG MEPILEX BORDER 4X8 (GAUZE/BANDAGES/DRESSINGS) ×3 IMPLANT
DURAPREP 26ML APPLICATOR (WOUND CARE) ×6 IMPLANT
ELECT REM PT RETURN 9FT ADLT (ELECTROSURGICAL) ×3
ELECTRODE REM PT RTRN 9FT ADLT (ELECTROSURGICAL) ×1 IMPLANT
GLOVE BIOGEL PI ORTHO PRO SZ8 (GLOVE) ×4
GLOVE ORTHO TXT STRL SZ7.5 (GLOVE) ×3 IMPLANT
GLOVE PI ORTHO PRO STRL SZ8 (GLOVE) ×2 IMPLANT
GLOVE SURG ORTHO 8.0 STRL STRW (GLOVE) ×3 IMPLANT
GOWN STRL REUS W/ TWL XL LVL3 (GOWN DISPOSABLE) ×1 IMPLANT
GOWN STRL REUS W/TWL 2XL LVL3 (GOWN DISPOSABLE) ×3 IMPLANT
GOWN STRL REUS W/TWL XL LVL3 (GOWN DISPOSABLE) ×2
HANDPIECE INTERPULSE COAX TIP (DISPOSABLE) ×2
HOOD PEEL AWAY FLYTE STAYCOOL (MISCELLANEOUS) ×6 IMPLANT
KIT BASIN OR (CUSTOM PROCEDURE TRAY) ×3 IMPLANT
KIT ROOM TURNOVER OR (KITS) ×3 IMPLANT
MANIFOLD NEPTUNE II (INSTRUMENTS) ×3 IMPLANT
NS IRRIG 1000ML POUR BTL (IV SOLUTION) ×3 IMPLANT
PACK SHOULDER (CUSTOM PROCEDURE TRAY) ×3 IMPLANT
PAD ARMBOARD 7.5X6 YLW CONV (MISCELLANEOUS) ×6 IMPLANT
PIN HUMERAL STMN 3.2MMX9IN (INSTRUMENTS) IMPLANT
PIN STEINMANN THREADED TIP (PIN) IMPLANT
SET HNDPC FAN SPRY TIP SCT (DISPOSABLE) ×1 IMPLANT
SLING ARM IMMOBILIZER LRG (SOFTGOODS) ×3 IMPLANT
SLING ARM IMMOBILIZER MED (SOFTGOODS) IMPLANT
SMARTMIX MINI TOWER (MISCELLANEOUS) ×3
SUCTION FRAZIER HANDLE 10FR (MISCELLANEOUS) ×2
SUCTION TUBE FRAZIER 10FR DISP (MISCELLANEOUS) ×1 IMPLANT
SUPPORT WRAP ARM LG (MISCELLANEOUS) ×3 IMPLANT
SUT FIBERWIRE #2 38 REV NDL BL (SUTURE) ×15
SUT VIC AB 0 CT1 27 (SUTURE) ×2
SUT VIC AB 0 CT1 27XBRD ANBCTR (SUTURE) ×1 IMPLANT
SUT VIC AB 2-0 CT1 27 (SUTURE) ×2
SUT VIC AB 2-0 CT1 TAPERPNT 27 (SUTURE) ×1 IMPLANT
SUT VIC AB 3-0 SH 8-18 (SUTURE) ×3 IMPLANT
SUTURE FIBERWR#2 38 REV NDL BL (SUTURE) ×5 IMPLANT
TOWER SMARTMIX MINI (MISCELLANEOUS) ×1 IMPLANT
TUBE CONNECTING 12'X1/4 (SUCTIONS)
TUBE CONNECTING 12X1/4 (SUCTIONS) IMPLANT
YANKAUER SUCT BULB TIP NO VENT (SUCTIONS) ×3 IMPLANT

## 2017-09-21 NOTE — Anesthesia Postprocedure Evaluation (Signed)
Anesthesia Post Note  Patient: Joe [name]  Procedure(s) Performed: SHOULDER HEMI-ARTHROPLASTY (Left )     Patient location during evaluation: PACU Anesthesia Type: General Level of consciousness: awake and alert Pain management: pain level controlled Vital Signs Assessment: post-procedure vital signs reviewed and stable Respiratory status: spontaneous breathing, nonlabored ventilation, respiratory function stable and patient connected to nasal cannula oxygen Cardiovascular status: blood pressure returned to baseline and stable Postop Assessment: no apparent nausea or vomiting Anesthetic complications: no    Last Vitals:  Vitals:   09/21/17 1605 09/21/17 1633  BP:  (!) 118/59  Pulse: (!) 56 67  Resp: 15 16  Temp: (!) 36.1 C (!) 36.1 C  SpO2: 100% 99%    Last Pain:  Vitals:   09/21/17 1600  TempSrc:   PainSc: [name] DAVID

## 2017-09-21 NOTE — Transfer of Care (Signed)
Immediate Anesthesia Transfer of Care Note  Patient: Joe [name]  Procedure(s) Performed: SHOULDER HEMI-ARTHROPLASTY (Left )  Patient Location: PACU  Anesthesia Type:General  Level of Consciousness: drowsy  Airway & Oxygen Therapy: Patient Spontanous Breathing and Patient connected to face mask oxygen  Post-op Assessment: Report given to RN and Post -op Vital signs reviewed and stable  Post vital signs: Reviewed and stable  Last Vitals:  Vitals:   09/21/17 1020 09/21/17 1025  BP: (!) 153/59 (!) 145/60  Pulse: (!) 51 (!) 54  Resp: 17 15  Temp:    SpO2: 100% 100%    Last Pain:  Vitals:   09/21/17 1025  TempSrc:   PainSc: 0-No pain      Patients Stated Pain Goal: 1 (64/33/29 5188)  Complications: No apparent anesthesia complications

## 2017-09-21 NOTE — Anesthesia Procedure Notes (Signed)
Anesthesia Regional Block: Interscalene brachial plexus block   Pre-Anesthetic Checklist: ,, timeout performed, Correct Patient, Correct Site, Correct Laterality, Correct Procedure, Correct Position, site marked, Risks and benefits discussed,  Surgical consent,  Pre-op evaluation,  At surgeon's request and post-op pain management  Laterality: Left  Prep: chloraprep       Needles:  Injection technique: Single-shot  Needle Type: Echogenic Stimulator Needle     Needle Length: 5cm  Needle Gauge: 21     Additional Needles:   Procedures:, nerve stimulator,,,,,,,   Nerve Stimulator or Paresthesia:  Response: 0.4 mA,   Additional Responses:   Narrative:  Start time: 09/21/2017 10:11 AM End time: 09/21/2017 10:21 AM Injection made incrementally with aspirations every 5 mL.  Performed by: Personally  Anesthesiologist: Lillia Abed, MD  Additional Notes: Monitors applied. Patient sedated. Sterile prep and drape,hand hygiene and sterile gloves were used. Relevant anatomy identified.Needle position confirmed.Local anesthetic injected incrementally after negative aspiration. Local anesthetic spread visualized around nerve(s). Vascular puncture avoided. No complications. Image printed for medical record.The patient tolerated the procedure well.

## 2017-09-21 NOTE — Plan of Care (Signed)
  Progressing Safety: Ability to remain free from injury will improve 09/21/2017 2006 - Progressing by Charlena Cross, RN Activity: Ability to tolerate increased activity will improve 09/21/2017 2006 - Progressing by Charlena Cross, RN Pain Management: Pain level will decrease with appropriate interventions 09/21/2017 2006 - Progressing by Charlena Cross, RN

## 2017-09-21 NOTE — Discharge Instructions (Signed)

## 2017-09-21 NOTE — H&P (Signed)
PREOPERATIVE H&P  Chief Complaint: DJD LEFT SHOULDER  HPI: Joe [name] is a 61 y.o. male who presents for preoperative history and physical with a diagnosis of DJD LEFT SHOULDER. Symptoms are rated as moderate to severe, and have been worsening.  This is significantly impairing activities of daily living.  He has elected for surgical management.   He has failed injections, activity modification, anti-inflammatories, and assistive devices.  Preoperative X-rays demonstrate end stage degenerative changes with osteophyte formation, loss of joint space, subchondral sclerosis.   Past Medical History:  Diagnosis Date  . Anxiety    over surgery  . Arthritis    BACK AND SHOULDER  . Cognitive deficits   . Congenital brain damage ([place])   . Constipation   . Expressive speech delay   . History of kidney stones   . 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 Osteoarthritis of left hip 01/16/2014  . Osteoarthritis of right hip 05/15/2014  . Right ureteral stone   . Speech impediment   . Umbilical hernia    Past Surgical History:  Procedure Laterality Date  . BACK SURGERY    . CYSTOSCOPY W/ URETERAL STENT REMOVAL Right 04/19/2013   Procedure: CYSTOSCOPY WITH STENT REMOVAL;  Surgeon: Molli Hazard, MD;  Location: [facility];  Service: Urology;  Laterality: Right;  . CYSTOSCOPY WITH RETROGRADE PYELOGRAM, URETEROSCOPY AND STENT PLACEMENT Right 04/19/2013   Procedure: CYSTOSCOPY WITH RETROGRADE PYELOGRAM, URETEROSCOPY ;  Surgeon: Molli Hazard, MD;  Location: [facility];  Service: Urology;  Laterality: Right;  . CYSTOSCOPY WITH STENT PLACEMENT Right 03/24/2013   Procedure: CYSTOSCOPY WITH STENT PLACEMENT;  Surgeon: Hanley Ben, MD;  Location: [facility];  Service: Urology;  Laterality: Right;  . HOLMIUM LASER APPLICATION Right 02/13/980   Procedure: HOLMIUM LASER APPLICATION;  Surgeon: Molli Hazard,  MD;  Location: [facility];  Service: Urology;  Laterality: Right;  . INGUINAL HERNIA REPAIR Left 03-04-2005  . POSTERIOR LUMBAR FUSION  08-27-2011   L4 -- L5  . RIGHT SHOULDER HEMIARTHROPLASTY  08-26-2010   OA  . TONSILLECTOMY  AS CHILD  . TOTAL HIP ARTHROPLASTY Left 01/16/2014   Procedure: TOTAL HIP ARTHROPLASTY;  Surgeon: Johnny Bridge, MD;  Location: [facility];  Service: Orthopedics;  Laterality: Left;  . TOTAL HIP ARTHROPLASTY Right 05/15/2014   Procedure: RIGHT TOTAL HIP ARTHROPLASTY;  Surgeon: Johnny Bridge, MD;  Location: [facility];  Service: Orthopedics;  Laterality: Right;  . TOTAL SHOULDER ARTHROPLASTY Right 2011   Social History   Socioeconomic History  . Marital status: Single    Spouse name: None  . Number of children: None  . Years of education: None  . Highest education level: None  Social Needs  . Financial resource strain: None  . Food insecurity - worry: None  . Food insecurity - inability: None  . Transportation needs - medical: None  . Transportation needs - non-medical: None  Occupational History  . None  Tobacco Use  . Smoking status: Never Smoker  . Smokeless tobacco: Never Used  Substance and Sexual Activity  . Alcohol use: No  . Drug use: No  . Sexual activity: None  Other Topics Concern  . 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  Lives with parents.   History reviewed. No pertinent family history. Allergies  Allergen Reactions  . Codeine Nausea Only  . Levaquin [Levofloxacin In D5w] Diarrhea   Prior to Admission medications   Medication Sig Start Date End Date Taking? Authorizing Provider  Acetaminophen (TYLENOL PO) Take 2 tablets by mouth every 8 (eight) hours as needed (pain).   Yes [provider]  CALCIUM-VITAMIN D PO Take 2 tablets by mouth daily.   Yes  [provider]  Cyanocobalamin (B-12) 5000 MCG CAPS Take 5,000 mcg by mouth daily.   Yes [provider]  docusate sodium (COLACE) 100 MG capsule Take 100 mg by mouth daily.   Yes [provider]  omeprazole (PRILOSEC) 40 MG capsule Take 40 mg by mouth daily.   Yes [provider]  baclofen (LIORESAL) 10 MG tablet Take 1 tablet (10 mg total) by mouth 3 (three) times daily. As needed for muscle spasm Patient not taking: Reported on 09/01/2017 05/16/14   Marchia Bond, MD  oxyCODONE-acetaminophen (PERCOCET) 10-325 MG per tablet Take 1-2 tablets by mouth every 6 (six) hours as needed for pain. MAXIMUM TOTAL ACETAMINOPHEN DOSE IS 4000 MG PER DAY Patient not taking: Reported on 09/01/2017 05/15/14   Marchia Bond, MD  rivaroxaban (XARELTO) 10 MG TABS tablet Take 1 tablet (10 mg total) by mouth daily. Patient not taking: Reported on 09/01/2017 05/15/14   Marchia Bond, MD  sennosides-docusate sodium (SENOKOT-S) 8.6-50 MG tablet Take 2 tablets by mouth daily. Patient not taking: Reported on 09/01/2017 05/15/14   Marchia Bond, MD  vitamin C (ASCORBIC ACID) 500 MG tablet Take 500 mg by mouth daily.    [provider]     Positive ROS: All other systems have been reviewed and were otherwise negative with the exception of those mentioned in the HPI and as above.  Physical Exam: General: Alert, no acute distress Cardiovascular: No pedal edema Respiratory: No cyanosis, no use of accessory musculature GI: No organomegaly, abdomen is soft and non-tender Skin: No lesions in the area of chief complaint Neurologic: Sensation intact distally Psychiatric: Patient is competent for consent with normal mood and affect Lymphatic: No axillary or cervical lymphadenopathy  MUSCULOSKELETAL: Left shoulder active motion 0-60 degrees with no external rotation, positive crepitance, significant painful arc of motion.  Assessment: DJD LEFT SHOULDER   Plan: Plan for  Procedure(s): TOTAL SHOULDER ARTHROPLASTY, versus hemiarthroplasty  The risks benefits and alternatives were discussed with the patient including but not limited to the risks of nonoperative treatment, versus surgical intervention including infection, bleeding, nerve injury,  blood clots, cardiopulmonary complications, morbidity, mortality, among others, and they were willing to proceed.   Johnny Bridge, MD Cell [phone]   09/21/2017 10:48 AM

## 2017-09-21 NOTE — Anesthesia Preprocedure Evaluation (Signed)
Anesthesia Evaluation  Patient identified by MRN, date of birth, ID band Patient awake    Reviewed: Allergy & Precautions, NPO status , Patient's Chart, lab work & pertinent test results  Airway Mallampati: I  TM Distance: >3 FB Neck ROM: Full    Dental   Pulmonary    Pulmonary exam normal        Cardiovascular Normal cardiovascular exam     Neuro/Psych Anxiety    GI/Hepatic   Endo/Other    Renal/GU      Musculoskeletal   Abdominal   Peds  Hematology   Anesthesia Other Findings   Reproductive/Obstetrics                             Anesthesia Physical Anesthesia Plan  ASA: III  Anesthesia Plan: General   Post-op Pain Management:  Regional for Post-op pain   Induction: Intravenous  PONV Risk Score and Plan: 2 and Ondansetron, Dexamethasone and Treatment may vary due to age or medical condition  Airway Management Planned: Oral ETT  Additional Equipment:   Intra-op Plan:   Post-operative Plan: Extubation in OR  Informed Consent: I have reviewed the patients History and Physical, chart, labs and discussed the procedure including the risks, benefits and alternatives for the proposed anesthesia with the patient or authorized representative who has indicated his/her understanding and acceptance.     Plan Discussed with: CRNA and Surgeon  Anesthesia Plan Comments:         Anesthesia Quick Evaluation

## 2017-09-21 NOTE — Op Note (Signed)
09/21/2017  3:07 PM  PATIENT:  Joe [name]    PRE-OPERATIVE DIAGNOSIS:  LEFT SHOULDER primary localized osteoarthritis  POST-OPERATIVE DIAGNOSIS:  Same  PROCEDURE: Left SHOULDER HEMI-ARTHROPLASTY  SURGEON:  Johnny Bridge, MD  PHYSICIAN ASSISTANT: Joya Gaskins, OPA-C, present and scrubbed throughout the case, critical for completion in a timely fashion, and for retraction, instrumentation, and closure.  ANESTHESIA:   General with an interscalene block  ESTIMATED BLOOD LOSS: 200 mL  PREOPERATIVE INDICATIONS:  Joe [name] is a  61 y.o. male with a diagnosis of DJD LEFT SHOULDER who failed conservative measures and elected for surgical management.  He has had a previous contralateral hemiarthroplasty with excellent results.  He had a preoperative CAT scan that demonstrated severe bone loss on the glenoid, enough that fixation for glenoid implantation appeared to be too risky to warrant, particularly given his excellent result on the contralateral side with the same structure of the anatomy.  The risks benefits and alternatives were discussed with the patient preoperatively including but not limited to the risks of infection, bleeding, nerve injury, cardiopulmonary complications, the need for revision surgery, dislocation, loosening, incomplete relief of pain, among others, and the patient was willing to proceed.   OPERATIVE IMPLANTS: Biomet size 12 micro press-fit humeral stem, size 46+21 versa-dial humeral head, set in the D position with increased coverage posteriorly.  OPERATIVE FINDINGS: Advanced glenohumeral osteoarthritis involving the glenoid and the humeral head with substantial osteophyte formation inferiorly.  The glenoid was extremely flattened, and it was completely smooth, but eburnated, with severe medialization.  There was not enough bone stock to warrant implantation of a glenoid component.   OPERATIVE PROCEDURE: The patient was brought to the operating room and  placed in the supine position. General anesthesia was administered. IV antibiotics were given.  The upper extremity was prepped and draped in usual sterile fashion. The patient was in a beachchair position with all bony prominences padded.   Time out was performed and a deltopectoral approach was carried out. The biceps tendon was tenodesed to the pectoralis tendon. The subscapularis was released, tagging it with a #2 FiberWire, leaving a cuff of tendon for repair.   The inferior osteophyte was removed, and release of the capsule off of the humeral side was completed. The head was dislocated, and I reamed sequentially. I placed the humeral cutting guide at 30 of retroversion, and then pinned this into place, and made my humeral neck cut. This was at the appropriate level.   I then placed deep retractors and exposed the glenoid. I excised the biceps tendon at its base, and evaluated the glenoid and elected not to perform the glenoid implantation because of loss of bone stock.  I sequentially broached, up to the selected size, with the broach set at 30 of retroversion. I placed 3 #2 Fiberwire through the bone for subsequent repair.  I then placed the real stem. I trialed with multiple heads, and the above-named component was selected. Increased posterior coverage improved the coverage. The soft tissue tension was appropriate.   I then impacted the real humeral head into place, reduced the head, and irrigated copiously. Excellent stability and range of motion was achieved. I repaired the subscapularis with a total of 5 #2 FiberWire; one for the interval, one for the corner, and then the remaining three from the lesser tuberosity which had already been passed.  I reinforced the repair by using the tag suture brought through parallel soft tissue tunnels at the bicipital groove in  order to augment closure of the subscapularis.    Satisfactory repair achieved and I irrigated copiously once more. The  subcutaneous tissue was closed with Vicryl including the deltopectoral fascia.   The skin was closed with Steri-Strips and sterile gauze was applied. He had a preoperative nerve block. He tolerated the procedure well and there were no complications.

## 2017-09-21 NOTE — Anesthesia Procedure Notes (Signed)
Procedure Name: Intubation Date/Time: 09/21/2017 1:03 PM Performed by: Freddie Breech, CRNA Pre-anesthesia Checklist: Patient identified, Emergency Drugs available, Suction available and Patient being monitored Patient Re-evaluated:Patient Re-evaluated prior to induction Oxygen Delivery Method: Circle System Utilized Preoxygenation: Pre-oxygenation with 100% oxygen Induction Type: IV induction Ventilation: Mask ventilation without difficulty Laryngoscope Size: Mac and 4 Grade View: Grade II Tube type: Oral Tube size: 7.5 mm Number of attempts: 1 Airway Equipment and Method: Stylet and Oral airway Placement Confirmation: ETT inserted through vocal cords under direct vision,  positive ETCO2 and breath sounds checked- equal and bilateral Secured at: 23 cm Tube secured with: Tape Dental Injury: Teeth and Oropharynx as per pre-operative assessment

## 2017-09-22 ENCOUNTER — Encounter (HOSPITAL_COMMUNITY): Payer: Self-pay | Admitting: Orthopedic Surgery

## 2017-09-22 LAB — BASIC METABOLIC PANEL
ANION GAP: 7 (ref 5–15)
BUN: 11 mg/dL (ref 6–20)
CALCIUM: 9 mg/dL (ref 8.9–10.3)
CHLORIDE: 102 mmol/L (ref 101–111)
CO2: 26 mmol/L (ref 22–32)
CREATININE: 0.77 mg/dL (ref 0.61–1.24)
GFR calc non Af Amer: >60 mL/min (ref >60)
GLUCOSE: 101 mg/dL — AB (ref 65–99)
Potassium: 4.4 mmol/L (ref 3.5–5.1)
Sodium: 135 mmol/L (ref 135–145)

## 2017-09-22 LAB — CBC
HEMATOCRIT: 37.5 % — AB (ref 39.0–52.0)
HEMOGLOBIN: 12.5 g/dL — AB (ref 13.0–17.0)
MCH: 32.7 pg (ref 26.0–34.0)
MCHC: 33.3 g/dL (ref 30.0–36.0)
MCV: 98.2 fL (ref 78.0–100.0)
Platelets: 227 10*3/uL (ref 150–400)
RBC: 3.82 MIL/uL — ABNORMAL LOW (ref 4.22–5.81)
RDW: 13.6 % (ref 11.5–15.5)
WBC: 9.7 10*3/uL (ref 4.0–10.5)

## 2017-09-22 MED ORDER — DIAZEPAM 5 MG PO TABS: 5.0000 mg | ORAL_TABLET | Freq: Four times a day (QID) | ORAL | 0 refills | Status: DC | PRN

## 2017-09-22 NOTE — Evaluation (Signed)
Physical Therapy Evaluation & Discharge   Patient Details Name: Joe [name] MRN: [id number] DOB: August 09, 1956 Today's Date: 09/22/2017   History of Present Illness  Pt is a 61 y/o male s/p L shoulder hemiarthroplasty. PMH including but not limited to congenital brain damage with cognitive deficits, PLIF in 2012, L THA in 2015 and R THA in 2015.  Clinical Impression  Pt presented sitting OOB in recliner chair, awake and willing to participate in therapy session. Pt's mother present throughout session as well. Prior to admission, pt reported that he was independent with all functional mobility and ADLs. Pt ambulated in hallway without use of an AD with supervision for safety. Pt also successfully completed stair training with no concerns. No further acute PT needs identified at this time. PT signing off.      Follow Up Recommendations No PT follow up    Equipment Recommendations  None recommended by PT    Recommendations for Other Services       Precautions / Restrictions Precautions Precautions: Shoulder Restrictions Weight Bearing Restrictions: Yes LUE Weight Bearing: Non weight bearing      Mobility  Bed Mobility               General bed mobility comments: pt OOB in recliner upon arrival  Transfers Overall transfer level: Needs assistance Equipment used: None Transfers: Sit to/from Stand Sit to Stand: Supervision         General transfer comment: for safety  Ambulation/Gait Ambulation/Gait assistance: Supervision Ambulation Distance (Feet): 150 Feet Assistive device: None Gait Pattern/deviations: Step-through pattern;Decreased stride length Gait velocity: decreased Gait velocity interpretation: Below normal speed for age/gender General Gait Details: mild instability but no overt LOB, supervision for safety  Stairs Stairs: Yes Stairs assistance: Supervision Stair Management: One rail Right;Step to pattern;Forwards Number of Stairs: 1 General stair  comments: no instability or LOB, supervision for safety with use of R rail  Wheelchair Mobility    Modified Rankin (Stroke Patients Only)       Balance Overall balance assessment: Needs assistance Sitting-balance support: Feet supported Sitting balance-Leahy Scale: Good     Standing balance support: During functional activity;No upper extremity supported Standing balance-Leahy Scale: Good                               Pertinent Vitals/Pain Pain Assessment: Faces Faces Pain Scale: Hurts a little bit Pain Location: L shoulder Pain Descriptors / Indicators: Sore Pain Intervention(s): Monitored during session;Repositioned    Home Living Family/patient expects to be discharged to:: Private residence Living Arrangements: Parent Available Help at Discharge: Family Type of Home: House Home Access: Stairs to enter Entrance Stairs-Rails: Right Entrance Stairs-Number of Steps: 1 Home Layout: One level Home Equipment: None      Prior Function Level of Independence: Independent         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, newspaper   Dominant Hand: Right    Extremity/Trunk Assessment   Upper Extremity Assessment Upper Extremity Assessment: Defer to OT evaluation    Lower Extremity Assessment Lower Extremity Assessment: Overall WFL for tasks assessed       Communication   Communication: No difficulties  Cognition Arousal/Alertness: Awake/alert Behavior During Therapy: WFL for tasks assessed/performed Overall Cognitive Status: History of cognitive impairments - at baseline  General Comments      Exercises     Assessment/Plan    PT Assessment Patent does not need any further PT services  PT Problem List         PT Treatment Interventions      PT Goals (Current goals can be found in the Care Plan section)  Acute Rehab PT Goals Patient Stated Goal: return home     Frequency     Barriers to discharge        Co-evaluation               AM-PAC PT "6 Clicks" Daily Activity  Outcome Measure Difficulty turning over in bed (including adjusting bedclothes, sheets and blankets)?: A Little Difficulty moving from lying on back to sitting on the side of the bed? : A Little Difficulty sitting down on and standing up from a chair with arms (e.g., wheelchair, bedside commode, etc,.)?: A Little Help needed moving to and from a bed to chair (including a wheelchair)?: None Help needed walking in hospital room?: None Help needed climbing 3-5 steps with a railing? : A Little 6 Click Score: 20    End of Session Equipment Utilized During Treatment: Other (comment)(L UE sling) Activity Tolerance: Patient tolerated treatment well Patient left: in chair;with call bell/phone within reach;with family/visitor present Nurse Communication: Mobility status PT Visit Diagnosis: Other abnormalities of gait and mobility (R26.89)    Time: 3888-2800 PT Time Calculation (min) (ACUTE ONLY): 10 min   Charges:   PT Evaluation $PT Eval Low Complexity: 1 Low     PT G Codes:        [place], PT, DPT [place] 09/22/2017, 9:19 AM

## 2017-09-22 NOTE — Discharge Summary (Addendum)
Physician Discharge Summary  Patient ID: Joe [name] MRN: [id number] DOB/AGE: [birth year] 63 y.o.  Admit date: 09/21/2017 Discharge date: 09/22/2017  Admission Diagnoses:  Primary localized osteoarthrosis of left shoulder  Discharge Diagnoses:  Principal Problem:   Primary localized osteoarthrosis of left shoulder Active Problems:   Primary localized osteoarthrosis of shoulder   Past Medical History:  Diagnosis Date  . Anxiety    over surgery  . Arthritis    BACK AND SHOULDER  . Cognitive deficits   . Congenital brain damage ([place])   . Constipation   . Expressive speech delay   . History of kidney stones   . 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 Osteoarthritis of left hip 01/16/2014  . Osteoarthritis of right hip 05/15/2014  . Primary localized osteoarthrosis of left shoulder 09/21/2017  . Right ureteral stone   . Speech impediment   . Umbilical hernia     Surgeries: Procedure(s): SHOULDER HEMI-ARTHROPLASTY on 09/21/2017   Consultants (if any):   Discharged Condition: Improved  Hospital Course: Joe [name] is an 61 y.o. male who was admitted 09/21/2017 with a diagnosis of Primary localized osteoarthrosis of left shoulder and went to the operating room on 09/21/2017 and underwent the above named procedures.    He was given perioperative antibiotics:  Anti-infectives (From admission, onward)   Start     Dose/Rate Route Frequency Ordered Stop   09/21/17 1900  ceFAZolin (ANCEF) IVPB 1 g/50 mL premix     1 g 100 mL/hr over 30 Minutes Intravenous Every 6 hours 09/21/17 1625 09/22/17 0630   09/21/17 1020  ceFAZolin (ANCEF) IVPB 2g/100 mL premix     2 g 200 mL/hr over 30 Minutes Intravenous To Surgery 09/21/17 0927 09/21/17 1307    .  He was given sequential compression devices, early ambulation for DVT prophylaxis.  He benefited maximally from the hospital stay and there were no complications.  He recovered full  neurovascular function by the morning.   Recent vital signs:  Vitals:   09/21/17 2249 09/22/17 0405  BP: 140/62 (!) 150/62  Pulse: 61 (!) 56  Resp: 18 18  Temp: (!) 97.5 F (36.4 C) (!) 97.4 F (36.3 C)  SpO2: 99% 99%    Recent laboratory studies:  Lab Results  Component Value Date   HGB 12.5 (L) 09/22/2017   HGB 13.8 09/10/2017   HGB 11.2 (L) 05/17/2014   Lab Results  Component Value Date   WBC 9.7 09/22/2017   PLT 227 09/22/2017   Lab Results  Component Value Date   INR 1.05 01/09/2014   Lab Results  Component Value Date   NA 137 09/10/2017   K 4.3 09/10/2017   CL 106 09/10/2017   CO2 24 09/10/2017   BUN 15 09/10/2017   CREATININE 0.77 09/10/2017   GLUCOSE 94 09/10/2017    Discharge Medications:   Allergies as of 09/22/2017      Reactions   Codeine Nausea Only   Levaquin [levofloxacin In D5w] Diarrhea      Medication List    STOP taking these medications   oxyCODONE-acetaminophen 10-325 MG tablet Commonly known as:  PERCOCET   rivaroxaban 10 MG Tabs tablet Commonly known as:  XARELTO     TAKE these medications   B-12 5000 MCG Caps Take 5,000 mcg by mouth daily.   baclofen 10 MG tablet Commonly known as:  LIORESAL Take 1 tablet (10 mg total) by mouth 3 (  three) times daily. As needed for muscle spasm   CALCIUM-VITAMIN D PO Take 2 tablets by mouth daily.   diazepam 5 MG tablet Commonly known as:  VALIUM Take 1 tablet (5 mg total) by mouth every 6 (six) hours as needed for anxiety.   docusate sodium 100 MG capsule Commonly known as:  COLACE Take 100 mg by mouth daily.   omeprazole 40 MG capsule Commonly known as:  PRILOSEC Take 40 mg by mouth daily.   ondansetron 4 MG tablet Commonly known as:  ZOFRAN Take 1 tablet (4 mg total) by mouth every 8 (eight) hours as needed for nausea or vomiting.   oxyCODONE 5 MG immediate release tablet Commonly known as:  ROXICODONE Take 1-2 tablets (5-10 mg total) by mouth every 4 (four) hours as  needed for severe pain.   sennosides-docusate sodium 8.6-50 MG tablet Commonly known as:  SENOKOT-S Take 2 tablets by mouth daily.   TYLENOL PO Take 2 tablets by mouth every 8 (eight) hours as needed (pain).   vitamin C 500 MG tablet Commonly known as:  ASCORBIC ACID Take 500 mg by mouth daily.       Diagnostic Studies: Ct Shoulder Left Wo Contrast  Result Date: 08/23/2017 CLINICAL DATA:  Planned left shoulder arthroplasty. EXAM: CT OF THE UPPER LEFT EXTREMITY WITHOUT CONTRAST TECHNIQUE: Multidetector CT imaging of the upper left extremity was performed according to the standard protocol. COMPARISON:  None. FINDINGS: Bones/Joint/Cartilage There are severe degenerative changes of the glenohumeral joint with severe joint space loss, bone-on-bone apposition, subchondral cystic change, bony remodeling of the glenoid and humeral head, and large osteophytes. Possible small loose body in the subscapularis recess. Mild degenerative changes of the acromioclavicular joint. No acute fracture or malalignment. Scout images demonstrate prior right shoulder hemiarthroplasty. Ligaments Suboptimally assessed by CT. Muscles and Tendons No focal abnormality.  Rotator cuff muscle bulk appears preserved. Soft tissues Unremarkable. Visualized left lung is clear. No axillary adenopathy. IMPRESSION: 1. End-stage left glenohumeral osteoarthritis. Possible loose body in the subscapularis recess. No acute osseous abnormality. Electronically Signed   By: Titus Dubin M.D.   On: 08/23/2017 15:43   Dg Shoulder Left Port  Result Date: 09/21/2017 CLINICAL DATA:  Status post shoulder replacement EXAM: LEFT SHOULDER - 1 VIEW COMPARISON:  None. FINDINGS: Left proximal humeral prosthesis is noted in satisfactory position. Degenerative changes of the glenohumeral joint are noted. Degenerative changes of the acromioclavicular joint are seen as well. No other focal abnormality is noted. IMPRESSION: Status post left shoulder  replacement. Electronically Signed   By: Inez Catalina M.D.   On: 09/21/2017 15:35    Disposition: 06-Home-Health Care Svc    Follow-up Information    Marchia Bond, MD. Schedule an appointment as soon as possible for a visit in 2 weeks.   Specialty:  Orthopedic Surgery Contact information: [address] [place] [address] [phone]            Signed: Johnny Bridge 09/22/2017, 7:27 AM

## 2017-09-22 NOTE — Progress Notes (Signed)
Pt doing well. Pt and mother given D/C instructions with verbal understanding. Pt's incision is clean and dry with no sign of infection. Pt's IV was removed prior to D/C. Pt D/C'd home via wheelchair @ 0945 per MD order. Pt is stable @ D/C and has no other needs at this time. Holli Humbles, RN

## 2017-09-22 NOTE — Evaluation (Signed)
Occupational Therapy Evaluation Patient Details Name: Joe [name] MRN: [id number] DOB: [birth year] Today's Date: 09/22/2017    History of Present Illness Pt is a 61 y/o male s/p L shoulder hemiarthroplasty. PMH including but not limited to congenital brain damage with cognitive deficits, PLIF in 2012, L THA in 2015 and R THA in 2015.   Clinical Impression   Patient evaluated by Occupational Therapy with no further acute OT needs identified. All education has been completed and the patient has no further questions. See below for any follow-up Occupational Therapy or equipment needs. OT to sign off. Thank you for referral.      Follow Up Recommendations  No OT follow up;DC plan and follow up therapy as arranged by surgeon    Equipment Recommendations  None recommended by OT    Recommendations for Other Services       Precautions / Restrictions Precautions Precautions: Shoulder Type of Shoulder Precautions: conservative Shoulder Interventions: At all times;Off for dressing/bathing/exercises Precaution Comments: elbow wrist hand ROM only Restrictions Weight Bearing Restrictions: Yes LUE Weight Bearing: Non weight bearing      Mobility Bed Mobility Overal bed mobility: Modified Independent             General bed mobility comments: pt OOB in recliner upon arrival  Transfers Overall transfer level: Needs assistance Equipment used: None Transfers: Sit to/from Stand Sit to Stand: Supervision         General transfer comment: for safety    Balance Overall balance assessment: Needs assistance Sitting-balance support: Feet supported Sitting balance-Leahy Scale: Good     Standing balance support: During functional activity;No upper extremity supported Standing balance-Leahy Scale: Good                             ADL either performed or assessed with clinical judgement   ADL Overall ADL's : Needs assistance/impaired Eating/Feeding: Set up    Grooming: Set up   Upper Body Bathing: Moderate assistance   Lower Body Bathing: Moderate assistance       Lower Body Dressing: Moderate assistance   Toilet Transfer: Min guard           Functional mobility during ADLs: Supervision/safety General ADL Comments: pt and mother educated on dressing with demonstration this session  Ptand mother educated on bathing/ dressing with keeping incision dry at this time. Pt educated on elbow wrist and hand only ROM. Pt very guarded with L UE and needs cues to attempt AROM of hand wrist elbow. Mother completely dressed patient with min cues for d/c home.    Vision Patient Visual Report: No change from baseline       Perception     Praxis      Pertinent Vitals/Pain Pain Assessment: Faces Faces Pain Scale: Hurts a little bit Pain Location: L shoulder Pain Descriptors / Indicators: Sore Pain Intervention(s): Monitored during session;Premedicated before session;Repositioned     Hand Dominance Right   Extremity/Trunk Assessment Upper Extremity Assessment Upper Extremity Assessment: LUE deficits/detail;RUE deficits/detail RUE Deficits / Details: 11 yr ago R shoulder surgery LUE Deficits / Details: s/p surg   Lower Extremity Assessment Lower Extremity Assessment: Overall WFL for tasks assessed   Cervical / Trunk Assessment Cervical / Trunk Assessment: Kyphotic   Communication Communication Communication: No difficulties   Cognition Arousal/Alertness: Awake/alert Behavior During Therapy: WFL for tasks assessed/performed Overall Cognitive Status: History of cognitive impairments - at baseline  General Comments  dressing dry and intact    Exercises Exercises: Shoulder   Shoulder Instructions Shoulder Instructions Donning/doffing shirt without moving shoulder: Caregiver independent with task Method for sponge bathing under operated UE: Caregiver independent with  task Donning/doffing sling/immobilizer: Caregiver independent with task Correct positioning of sling/immobilizer: Caregiver independent with task ROM for elbow, wrist and digits of operated UE: Caregiver independent with task Sling wearing schedule (on at all times/off for ADL's): Caregiver independent with task Proper positioning of operated UE when showering: Caregiver independent with task Dressing change: Caregiver independent with task Positioning of UE while sleeping: Caregiver independent with task    Home Living Family/patient expects to be discharged to:: Private residence Living Arrangements: Parent Available Help at Discharge: Family Type of Home: House Home Access: Stairs to enter Technical brewer of Steps: 1 Entrance Stairs-Rails: Right Home Layout: One level     Bathroom Shower/Tub: Occupational psychologist: Standard     Home Equipment: None          Prior Functioning/Environment Level of Independence: Independent        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        OT Problem List:        OT Treatment/Interventions:      OT Goals(Current goals can be found in the care plan section) Acute Rehab OT Goals Patient Stated Goal: return home OT Goal Formulation: With patient  OT Frequency:     Barriers to D/C:            Co-evaluation              AM-PAC PT "6 Clicks" Daily Activity     Outcome Measure Help from another person eating meals?: A Little Help from another person taking care of personal grooming?: A Little Help from another person toileting, which includes using toliet, bedpan, or urinal?: A Little Help from another person bathing (including washing, rinsing, drying)?: A Little Help from another person to put on and taking off regular upper body clothing?: A Little Help from another person to put on and taking off regular lower body clothing?: A Little 6 Click Score: 18   End of Session Equipment Utilized  During Treatment: Gait belt Nurse Communication: Mobility status;Precautions  Activity Tolerance: Patient tolerated treatment well Patient left: in chair;with call bell/phone within reach;with family/visitor present  OT Visit Diagnosis: Unsteadiness on feet (R26.81)                Time: 4098-1191 OT Time Calculation (min): 29 min Charges:  OT General Charges $OT Visit: 1 Visit OT Evaluation $OT Eval Moderate Complexity: 1 Mod G-Codes: OT G-codes **NOT FOR INPATIENT CLASS** Functional Assessment Tool Used: Clinical judgement Functional Limitation: Self care Self Care Current Status (Y7829): At least 1 percent but less than 20 percent impaired, limited or restricted Self Care Discharge Status [phone]): At least 1 percent but less than 20 percent impaired, limited or restricted    Jeri Modena   OTR/L Pager: [phone] Office: [phone] .   Parke Poisson B 09/22/2017, 11:27 AM

## 2017-09-30 ENCOUNTER — Encounter (HOSPITAL_COMMUNITY): Payer: Self-pay | Admitting: Orthopedic Surgery

## 2017-10-06 DIAGNOSIS — M19012 Primary osteoarthritis, left shoulder: Secondary | ICD-10-CM | POA: Diagnosis not present

## 2017-11-03 DIAGNOSIS — M25512 Pain in left shoulder: Secondary | ICD-10-CM | POA: Diagnosis not present

## 2017-11-03 DIAGNOSIS — M19012 Primary osteoarthritis, left shoulder: Secondary | ICD-10-CM | POA: Diagnosis not present

## 2017-11-04 DIAGNOSIS — M6281 Muscle weakness (generalized): Secondary | ICD-10-CM | POA: Diagnosis not present

## 2017-11-04 DIAGNOSIS — M25612 Stiffness of left shoulder, not elsewhere classified: Secondary | ICD-10-CM | POA: Diagnosis not present

## 2017-11-04 DIAGNOSIS — M19012 Primary osteoarthritis, left shoulder: Secondary | ICD-10-CM | POA: Diagnosis not present

## 2017-11-04 DIAGNOSIS — M25512 Pain in left shoulder: Secondary | ICD-10-CM | POA: Diagnosis not present

## 2017-11-09 DIAGNOSIS — M19012 Primary osteoarthritis, left shoulder: Secondary | ICD-10-CM | POA: Diagnosis not present

## 2017-11-09 DIAGNOSIS — M25612 Stiffness of left shoulder, not elsewhere classified: Secondary | ICD-10-CM | POA: Diagnosis not present

## 2017-11-09 DIAGNOSIS — M6281 Muscle weakness (generalized): Secondary | ICD-10-CM | POA: Diagnosis not present

## 2017-11-09 DIAGNOSIS — M25512 Pain in left shoulder: Secondary | ICD-10-CM | POA: Diagnosis not present

## 2017-11-12 DIAGNOSIS — M6281 Muscle weakness (generalized): Secondary | ICD-10-CM | POA: Diagnosis not present

## 2017-11-12 DIAGNOSIS — M19012 Primary osteoarthritis, left shoulder: Secondary | ICD-10-CM | POA: Diagnosis not present

## 2017-11-12 DIAGNOSIS — M25612 Stiffness of left shoulder, not elsewhere classified: Secondary | ICD-10-CM | POA: Diagnosis not present

## 2017-11-12 DIAGNOSIS — M25512 Pain in left shoulder: Secondary | ICD-10-CM | POA: Diagnosis not present

## 2017-11-16 DIAGNOSIS — M25512 Pain in left shoulder: Secondary | ICD-10-CM | POA: Diagnosis not present

## 2017-11-16 DIAGNOSIS — M6281 Muscle weakness (generalized): Secondary | ICD-10-CM | POA: Diagnosis not present

## 2017-11-16 DIAGNOSIS — M25612 Stiffness of left shoulder, not elsewhere classified: Secondary | ICD-10-CM | POA: Diagnosis not present

## 2017-11-16 DIAGNOSIS — M19012 Primary osteoarthritis, left shoulder: Secondary | ICD-10-CM | POA: Diagnosis not present

## 2017-11-18 DIAGNOSIS — M25612 Stiffness of left shoulder, not elsewhere classified: Secondary | ICD-10-CM | POA: Diagnosis not present

## 2017-11-18 DIAGNOSIS — M19012 Primary osteoarthritis, left shoulder: Secondary | ICD-10-CM | POA: Diagnosis not present

## 2017-11-18 DIAGNOSIS — M25512 Pain in left shoulder: Secondary | ICD-10-CM | POA: Diagnosis not present

## 2017-11-18 DIAGNOSIS — M6281 Muscle weakness (generalized): Secondary | ICD-10-CM | POA: Diagnosis not present

## 2017-11-22 DIAGNOSIS — M19012 Primary osteoarthritis, left shoulder: Secondary | ICD-10-CM | POA: Diagnosis not present

## 2017-12-06 DIAGNOSIS — M6281 Muscle weakness (generalized): Secondary | ICD-10-CM | POA: Diagnosis not present

## 2017-12-06 DIAGNOSIS — M25612 Stiffness of left shoulder, not elsewhere classified: Secondary | ICD-10-CM | POA: Diagnosis not present

## 2017-12-06 DIAGNOSIS — M19012 Primary osteoarthritis, left shoulder: Secondary | ICD-10-CM | POA: Diagnosis not present

## 2017-12-06 DIAGNOSIS — M25512 Pain in left shoulder: Secondary | ICD-10-CM | POA: Diagnosis not present

## 2017-12-10 DIAGNOSIS — M25612 Stiffness of left shoulder, not elsewhere classified: Secondary | ICD-10-CM | POA: Diagnosis not present

## 2017-12-10 DIAGNOSIS — M6281 Muscle weakness (generalized): Secondary | ICD-10-CM | POA: Diagnosis not present

## 2017-12-10 DIAGNOSIS — M19012 Primary osteoarthritis, left shoulder: Secondary | ICD-10-CM | POA: Diagnosis not present

## 2017-12-10 DIAGNOSIS — M25512 Pain in left shoulder: Secondary | ICD-10-CM | POA: Diagnosis not present

## 2017-12-13 DIAGNOSIS — M25512 Pain in left shoulder: Secondary | ICD-10-CM | POA: Diagnosis not present

## 2017-12-13 DIAGNOSIS — M19012 Primary osteoarthritis, left shoulder: Secondary | ICD-10-CM | POA: Diagnosis not present

## 2017-12-13 DIAGNOSIS — M6281 Muscle weakness (generalized): Secondary | ICD-10-CM | POA: Diagnosis not present

## 2017-12-13 DIAGNOSIS — M25612 Stiffness of left shoulder, not elsewhere classified: Secondary | ICD-10-CM | POA: Diagnosis not present

## 2017-12-16 DIAGNOSIS — M19012 Primary osteoarthritis, left shoulder: Secondary | ICD-10-CM | POA: Diagnosis not present

## 2017-12-16 DIAGNOSIS — M6281 Muscle weakness (generalized): Secondary | ICD-10-CM | POA: Diagnosis not present

## 2017-12-16 DIAGNOSIS — M25512 Pain in left shoulder: Secondary | ICD-10-CM | POA: Diagnosis not present

## 2017-12-16 DIAGNOSIS — M25612 Stiffness of left shoulder, not elsewhere classified: Secondary | ICD-10-CM | POA: Diagnosis not present

## 2017-12-20 DIAGNOSIS — M19012 Primary osteoarthritis, left shoulder: Secondary | ICD-10-CM | POA: Diagnosis not present

## 2017-12-21 DIAGNOSIS — M19012 Primary osteoarthritis, left shoulder: Secondary | ICD-10-CM | POA: Diagnosis not present

## 2017-12-21 DIAGNOSIS — M6281 Muscle weakness (generalized): Secondary | ICD-10-CM | POA: Diagnosis not present

## 2017-12-21 DIAGNOSIS — M25512 Pain in left shoulder: Secondary | ICD-10-CM | POA: Diagnosis not present

## 2017-12-21 DIAGNOSIS — M25612 Stiffness of left shoulder, not elsewhere classified: Secondary | ICD-10-CM | POA: Diagnosis not present

## 2017-12-23 DIAGNOSIS — M6281 Muscle weakness (generalized): Secondary | ICD-10-CM | POA: Diagnosis not present

## 2017-12-23 DIAGNOSIS — M25512 Pain in left shoulder: Secondary | ICD-10-CM | POA: Diagnosis not present

## 2017-12-23 DIAGNOSIS — M19012 Primary osteoarthritis, left shoulder: Secondary | ICD-10-CM | POA: Diagnosis not present

## 2017-12-23 DIAGNOSIS — M25612 Stiffness of left shoulder, not elsewhere classified: Secondary | ICD-10-CM | POA: Diagnosis not present

## 2018-01-04 DIAGNOSIS — M25612 Stiffness of left shoulder, not elsewhere classified: Secondary | ICD-10-CM | POA: Diagnosis not present

## 2018-01-04 DIAGNOSIS — M19012 Primary osteoarthritis, left shoulder: Secondary | ICD-10-CM | POA: Diagnosis not present

## 2018-01-04 DIAGNOSIS — M25512 Pain in left shoulder: Secondary | ICD-10-CM | POA: Diagnosis not present

## 2018-01-04 DIAGNOSIS — M6281 Muscle weakness (generalized): Secondary | ICD-10-CM | POA: Diagnosis not present

## 2018-01-06 DIAGNOSIS — M25512 Pain in left shoulder: Secondary | ICD-10-CM | POA: Diagnosis not present

## 2018-01-06 DIAGNOSIS — M6281 Muscle weakness (generalized): Secondary | ICD-10-CM | POA: Diagnosis not present

## 2018-01-06 DIAGNOSIS — M25612 Stiffness of left shoulder, not elsewhere classified: Secondary | ICD-10-CM | POA: Diagnosis not present

## 2018-01-06 DIAGNOSIS — M19012 Primary osteoarthritis, left shoulder: Secondary | ICD-10-CM | POA: Diagnosis not present

## 2018-01-17 DIAGNOSIS — M19012 Primary osteoarthritis, left shoulder: Secondary | ICD-10-CM | POA: Diagnosis not present

## 2018-01-25 DIAGNOSIS — M25612 Stiffness of left shoulder, not elsewhere classified: Secondary | ICD-10-CM | POA: Diagnosis not present

## 2018-01-25 DIAGNOSIS — M25512 Pain in left shoulder: Secondary | ICD-10-CM | POA: Diagnosis not present

## 2018-01-25 DIAGNOSIS — M19012 Primary osteoarthritis, left shoulder: Secondary | ICD-10-CM | POA: Diagnosis not present

## 2018-01-25 DIAGNOSIS — M6281 Muscle weakness (generalized): Secondary | ICD-10-CM | POA: Diagnosis not present

## 2018-09-30 ENCOUNTER — Ambulatory Visit (INDEPENDENT_AMBULATORY_CARE_PROVIDER_SITE_OTHER): Payer: Medicare Other | Admitting: Nurse Practitioner

## 2018-09-30 ENCOUNTER — Encounter: Payer: Self-pay | Admitting: Nurse Practitioner

## 2018-09-30 VITALS — BP 122/60 | HR 72 | Temp 97.7°F | Ht 65.0 in | Wt 158.6 lb

## 2018-09-30 DIAGNOSIS — N390 Urinary tract infection, site not specified: Secondary | ICD-10-CM

## 2018-09-30 DIAGNOSIS — Z23 Encounter for immunization: Secondary | ICD-10-CM

## 2018-09-30 DIAGNOSIS — E559 Vitamin D deficiency, unspecified: Secondary | ICD-10-CM

## 2018-09-30 DIAGNOSIS — E782 Mixed hyperlipidemia: Secondary | ICD-10-CM

## 2018-09-30 DIAGNOSIS — Z0000 Encounter for general adult medical examination without abnormal findings: Secondary | ICD-10-CM

## 2018-09-30 LAB — POCT URINALYSIS DIPSTICK
Bilirubin, UA: NEGATIVE
GLUCOSE UA: NEGATIVE
Ketones, UA: NEGATIVE
Nitrite, UA: POSITIVE
Protein, UA: NEGATIVE
Spec Grav, UA: 1.01 (ref 1.010–1.025)
Urobilinogen, UA: 0.2 E.U./dL
pH, UA: 5.5 (ref 5.0–8.0)

## 2018-09-30 MED ORDER — NITROFURANTOIN MONOHYD MACRO 100 MG PO CAPS: 100.0000 mg | ORAL_CAPSULE | Freq: Two times a day (BID) | ORAL | 0 refills | Status: AC

## 2018-09-30 NOTE — Patient Instructions (Signed)
Urinary Tract Infection, Adult A urinary tract infection (UTI) is an infection of any part of the urinary tract. The urinary tract includes:  The kidneys.  The ureters.  The bladder.  The urethra. These organs make, store, and get rid of pee (urine) in the body. What are the causes? This is caused by germs (bacteria) in your genital area. These germs grow and cause swelling (inflammation) of your urinary tract. What increases the risk? You are more likely to develop this condition if:  You have a small, thin tube (catheter) to drain pee.  You cannot control when you pee or poop (incontinence).  You are male, and: ? You use these methods to prevent pregnancy: ? A medicine that kills sperm (spermicide). ? A device that blocks sperm (diaphragm). ? You have low levels of a male hormone (estrogen). ? You are pregnant.  You have genes that add to your risk.  You are sexually active.  You take antibiotic medicines.  You have trouble peeing because of: ? A prostate that is bigger than normal, if you are male. ? A blockage in the part of your body that drains pee from the bladder (urethra). ? A kidney stone. ? A nerve condition that affects your bladder (neurogenic bladder). ? Not getting enough to drink. ? Not peeing often enough.  You have other conditions, such as: ? Diabetes. ? A weak disease-fighting system (immune system). ? Sickle cell disease. ? Gout. ? Injury of the spine. What are the signs or symptoms? Symptoms of this condition include:  Needing to pee right away (urgently).  Peeing often.  Peeing small amounts often.  Pain or burning when peeing.  Blood in the pee.  Pee that smells bad or not like normal.  Trouble peeing.  Pee that is cloudy.  Fluid coming from the vagina, if you are male.  Pain in the belly or lower back. Other symptoms include:  Throwing up (vomiting).  No urge to eat.  Feeling mixed up (confused).  Being tired  and grouchy (irritable).  A fever.  Watery poop (diarrhea). How is this treated? This condition may be treated with:  Antibiotic medicine.  Other medicines.  Drinking enough water. Follow these instructions at home:  Medicines  Take over-the-counter and prescription medicines only as told by your doctor.  If you were prescribed an antibiotic medicine, take it as told by your doctor. Do not stop taking it even if you start to feel better. General instructions  Make sure you: ? Pee until your bladder is empty. ? Do not hold pee for a long time. ? Empty your bladder after sex. ? Wipe from front to back after pooping if you are a male. Use each tissue one time when you wipe.  Drink enough fluid to keep your pee pale yellow.  Keep all follow-up visits as told by your doctor. This is important. Contact a doctor if:  You do not get better after 1-2 days.  Your symptoms go away and then come back. Get help right away if:  You have very bad back pain.  You have very bad pain in your lower belly.  You have a fever.  You are sick to your stomach (nauseous).  You are throwing up. Summary  A urinary tract infection (UTI) is an infection of any part of the urinary tract.  This condition is caused by germs in your genital area.  There are many risk factors for a UTI. These include having a small, thin   tube to drain pee and not being able to control when you pee or poop.  Treatment includes antibiotic medicines for germs.  Drink enough fluid to keep your pee pale yellow. This information is not intended to replace advice given to you by your health care provider. Make sure you discuss any questions you have with your health care provider. Document Released: 03/16/2008 Document Revised: 04/07/2018 Document Reviewed: 04/07/2018 Elsevier Interactive Patient Education  2019 [employer].  [name] , Thank you for taking time to come for your Medicare Wellness Visit.  I appreciate your ongoing commitment to your health goals. Please review the following plan we discussed and let me know if I can assist you in the future.   These are the goals we discussed: Goals    . Exercise 3x per week (30 min per time)     Mother would like for him to exercise more regularly       This is a list of the screening recommended for you and due dates:  Health Maintenance  Topic Date Due  . Tetanus Vaccine  05/04/1973  . Flu Shot  01/10/2019*  .  Hepatitis C: One time screening is recommended by Center for Disease Control  (CDC) for  adults born from [birth year] through 1967.   10/01/2019*  . HIV Screening  10/01/2019*  . Colon Cancer Screening  01/28/2027  *Topic was postponed. The date shown is not the original due date.

## 2018-09-30 NOTE — Progress Notes (Signed)
Subjective:     Patient ID: Joe [name] , male    DOB: 1956-07-21 , 62 y.o.   MRN: [id number]   Chief Complaint  Patient presents with  . Medicare Wellness    HPI  Here for Medicare Annual    Past Medical History:  Diagnosis Date  . Anxiety    over surgery  . Arthritis    BACK AND SHOULDER  . Cognitive deficits   . Congenital brain damage ([place])   . Constipation   . Expressive speech delay   . History of kidney stones   . 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 Osteoarthritis of left hip 01/16/2014  . Osteoarthritis of right hip 05/15/2014  . Primary localized osteoarthrosis of left shoulder 09/21/2017  . Right ureteral stone   . Speech impediment   . Umbilical hernia      No family history on file.   Current Outpatient Medications:  .  Acetaminophen (TYLENOL PO), Take 2 tablets by mouth every 8 (eight) hours as needed (pain)., Disp: , Rfl:  .  CALCIUM-VITAMIN D PO, Take 2 tablets by mouth daily., Disp: , Rfl:  .  Cyanocobalamin (B-12) 5000 MCG CAPS, Take 5,000 mcg by mouth daily., Disp: , Rfl:  .  docusate sodium (COLACE) 100 MG capsule, Take 100 mg by mouth daily., Disp: , Rfl:  .  Multiple Vitamin (MULTIVITAMIN WITH MINERALS) TABS tablet, Take 1 tablet by mouth daily., Disp: , Rfl:  .  omeprazole (PRILOSEC) 40 MG capsule, Take 40 mg by mouth daily., Disp: , Rfl:  .  sennosides-docusate sodium (SENOKOT-S) 8.6-50 MG tablet, Take 2 tablets by mouth daily., Disp: 30 tablet, Rfl: 1 .  vitamin C (ASCORBIC ACID) 500 MG tablet, Take 500 mg by mouth daily., Disp: , Rfl:    Allergies  Allergen Reactions  . Codeine Nausea Only  . Levaquin [Levofloxacin In D5w] Diarrhea     Review of Systems  Constitutional: Negative.   HENT: Negative.   Eyes: Negative.   Respiratory: Negative.   Cardiovascular: Negative.   Gastrointestinal: Positive for constipation.  Endocrine: Negative.   Genitourinary: Negative.     Musculoskeletal: Negative.   Skin: Negative.   Allergic/Immunologic: Negative.   Neurological: Negative.   Hematological: Negative.   Psychiatric/Behavioral: Negative.      Today's Vitals   09/30/18 1431  BP: 122/60  Pulse: 72  Temp: 97.7 F (36.5 C)  TempSrc: Oral  SpO2: 97%  Weight: 158 lb 9.6 oz (71.9 kg)  Height: _0  (1.651 m)  PainSc: 0-No pain   Body mass index is 26.39 kg/m.   Objective:  Physical Exam Vitals signs reviewed.  Constitutional:      Appearance: Normal appearance.  HENT:     Head: Normocephalic.     Right Ear: Tympanic membrane, ear canal and external ear normal.     Left Ear: Tympanic membrane, ear canal and external ear normal.     Mouth/Throat:     Mouth: Mucous membranes are moist.  Eyes:     Extraocular Movements: Extraocular movements intact.     Conjunctiva/sclera: Conjunctivae normal.     Pupils: Pupils are equal, round, and reactive to light.  Neck:     Musculoskeletal: Normal range of motion.  Cardiovascular:     Rate and Rhythm: Normal rate.     Pulses: Normal pulses.     Heart sounds: Normal heart sounds. No murmur.  Pulmonary:  Effort: Pulmonary effort is normal.     Breath sounds: Normal breath sounds.  Abdominal:     General: Abdomen is flat. Bowel sounds are normal.     Palpations: Abdomen is soft.  Musculoskeletal: Normal range of motion.  Skin:    General: Skin is warm and dry.     Capillary Refill: Capillary refill takes less than 2 seconds.  Neurological:     General: No focal deficit present.     Mental Status: He is alert and oriented to person, place, and time.  Psychiatric:        Mood and Affect: Mood normal.         Assessment And Plan:       1. Urinary tract infection without hematuria, site unspecified  UA positive for nitrites  Will treat with macrobid BID  Encouraged to increase water intake and drink cranberry juice - Culture, Urine - nitrofurantoin, macrocrystal-monohydrate, (MACROBID)  100 MG capsule; Take 1 capsule (100 mg total) by mouth 2 (two) times daily for 5 days.  Dispense: 10 capsule; Refill: 0 - CBC no Diff - BMP8+eGFR - POCT Urinalysis Dipstick (81002)  2. Encounter for immunization  Will give tetanus vaccine today while in office. Refer to order management. TDAP will be administered to adults 50-43 years old every 10 years. - Tdap vaccine greater than or equal to 7yo IM  3. Encounter for Medicare annual wellness exam  Pt's annual wellness exam was performed and geriatric assessment reviewed.   Pt has no new identiafble wellness concerns at this time.   WIll obtain routine labs.   Will obtain UA and micro.   Behavior modifications discussed and diet history reviewed. Pt will continue to exercise regularly and modify diet, with low GI, plant based foods and decrease food intake of processed foods.   Recommend intake of daily multivitamin, Vitamin D, and calcium.  Recommond colonoscopy (up to date) for preventive screenings, as well as recommend immunizations that include influenza (mother declines) and TDAP  4. Vitamin D deficiency  Will check vitamin D level and supplement as needed.     Also encouraged to spend 15 minutes in the sun daily.  - Vitamin D (25 hydroxy)  5. Mixed hyperlipidemia  Chronic, controlled  No current medications - Lipid panel   Minette Brine, FNP    Subjective:   Joe [name] is a 62 y.o. male who presents for an Initial Medicare Annual Wellness Visit.  Review of Systems  See above      Objective:    Today's Vitals   09/30/18 1431  BP: 122/60  Pulse: 72  Temp: 97.7 F (36.5 C)  TempSrc: Oral  SpO2: 97%  Weight: 158 lb 9.6 oz (71.9 kg)  Height: '5\' 5"'$  (1.651 m)  PainSc: 0-No pain   Body mass index is 26.39 kg/m.  Advanced Directives 09/30/2018 09/10/2017 05/15/2014 05/04/2014 04/19/2013 03/24/2013 12/12/2012  Does Patient Have a Medical Advance Directive? No Yes Patient has advance directive, copy in  chart Patient has advance directive, copy in chart Patient has advance directive, copy not in chart Patient has advance directive, copy not in chart Patient does not have advance directive  Type of Advance Directive - [place];Living will [place];Living will [place];Living will [place];Living will [place];Living will -  Does patient want to make changes to medical advance directive? - - No change requested No change requested - - -  Copy of  Healthcare Power of Attorney in Chart? - - - - Copy requested from family Copy requested from family -  Would patient like information on creating a medical advance directive? Yes (MAU/Ambulatory/Procedural Areas - Information given) - - - - - -  Pre-existing out of facility DNR order (yellow form or pink MOST form) - - - - - No No    Current Medications (verified) Outpatient Encounter Medications as of 09/30/2018  Medication Sig  . Acetaminophen (TYLENOL PO) Take 2 tablets by mouth every 8 (eight) hours as needed (pain).  . CALCIUM-VITAMIN D PO Take 2 tablets by mouth daily.  . Cyanocobalamin (B-12) 5000 MCG CAPS Take 5,000 mcg by mouth daily.  [name] Kitchen docusate sodium (COLACE) 100 MG capsule Take 100 mg by mouth daily.  . Multiple Vitamin (MULTIVITAMIN WITH MINERALS) TABS tablet Take 1 tablet by mouth daily.  [name] Kitchen omeprazole (PRILOSEC) 40 MG capsule Take 40 mg by mouth daily.  . sennosides-docusate sodium (SENOKOT-S) 8.6-50 MG tablet Take 2 tablets by mouth daily.  . vitamin C (ASCORBIC ACID) 500 MG tablet Take 500 mg by mouth daily.  . nitrofurantoin, macrocrystal-monohydrate, (MACROBID) 100 MG capsule Take 1 capsule (100 mg total) by mouth 2 (two) times daily for 5 days.  . [DISCONTINUED] baclofen (LIORESAL) 10 MG tablet Take 1 tablet (10 mg total) by mouth 3 (three) times daily. As needed for muscle spasm (Patient not taking: Reported on 09/30/2018)  .  [DISCONTINUED] diazepam (VALIUM) 5 MG tablet Take 1 tablet (5 mg total) by mouth every 6 (six) hours as needed for anxiety. (Patient not taking: Reported on 09/30/2018)  . [DISCONTINUED] ondansetron (ZOFRAN) 4 MG tablet Take 1 tablet (4 mg total) by mouth every 8 (eight) hours as needed for nausea or vomiting. (Patient not taking: Reported on 09/30/2018)  . [DISCONTINUED] oxyCODONE (ROXICODONE) 5 MG immediate release tablet Take 1-2 tablets (5-10 mg total) by mouth every 4 (four) hours as needed for severe pain. (Patient not taking: Reported on 09/30/2018)   No facility-administered encounter medications on file as of 09/30/2018.     Allergies (verified) Codeine and Levaquin [levofloxacin in d5w]   History: Past Medical History:  Diagnosis Date  . Anxiety    over surgery  . Arthritis    BACK AND SHOULDER  . Cognitive deficits   . Congenital brain damage ([place])   . Constipation   . Expressive speech delay   . History of kidney stones   . 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 Osteoarthritis of left hip 01/16/2014  . Osteoarthritis of right hip 05/15/2014  . Primary localized osteoarthrosis of left shoulder 09/21/2017  . Right ureteral stone   . Speech impediment   . Umbilical hernia    Past Surgical History:  Procedure Laterality Date  . BACK SURGERY    . CYSTOSCOPY W/ URETERAL STENT REMOVAL Right 04/19/2013   Procedure: CYSTOSCOPY WITH STENT REMOVAL;  Surgeon: Molli Hazard, MD;  Location: [facility];  Service: Urology;  Laterality: Right;  . CYSTOSCOPY WITH RETROGRADE PYELOGRAM, URETEROSCOPY AND STENT PLACEMENT Right 04/19/2013   Procedure: CYSTOSCOPY WITH RETROGRADE PYELOGRAM, URETEROSCOPY ;  Surgeon: Molli Hazard, MD;  Location: [facility];  Service: Urology;  Laterality: Right;  . CYSTOSCOPY WITH STENT PLACEMENT Right 03/24/2013   Procedure: CYSTOSCOPY WITH STENT PLACEMENT;  Surgeon:  Hanley Ben, MD;  Location: [facility];  Service: Urology;  Laterality: Right;  . HOLMIUM LASER APPLICATION Right 4/0/9811  Procedure: HOLMIUM LASER APPLICATION;  Surgeon: Molli Hazard, MD;  Location: [facility];  Service: Urology;  Laterality: Right;  . INGUINAL HERNIA REPAIR Left 03-04-2005  . POSTERIOR LUMBAR FUSION  08-27-2011   L4 -- L5  . RIGHT SHOULDER HEMIARTHROPLASTY  08-26-2010   OA  . SHOULDER HEMI-ARTHROPLASTY Left 09/21/2017   Procedure: SHOULDER HEMI-ARTHROPLASTY;  Surgeon: Marchia Bond, MD;  Location: [place];  Service: Orthopedics;  Laterality: Left;  . TONSILLECTOMY  AS CHILD  . TOTAL HIP ARTHROPLASTY Left 01/16/2014   Procedure: TOTAL HIP ARTHROPLASTY;  Surgeon: Johnny Bridge, MD;  Location: [place];  Service: Orthopedics;  Laterality: Left;  . TOTAL HIP ARTHROPLASTY Right 05/15/2014   Procedure: RIGHT TOTAL HIP ARTHROPLASTY;  Surgeon: Johnny Bridge, MD;  Location: [place];  Service: Orthopedics;  Laterality: Right;  . TOTAL SHOULDER ARTHROPLASTY Right 2011   No family history on file. Social History   Socioeconomic History  . Marital status: Single    Spouse name: Not on file  . Number of children: Not on file  . Years of education: Not on file  . Highest education level: Not on file  Occupational History  . Not on file  Social Needs  . Financial resource strain: Not on file  . Food insecurity:    Worry: Not on file    Inability: Not on file  . Transportation needs:    Medical: Not on file    Non-medical: Not on file  Tobacco Use  . Smoking status: Never Smoker  . Smokeless tobacco: Never Used  Substance and Sexual Activity  . Alcohol use: No  . Drug use: No  . Sexual activity: Not on file  Lifestyle  . Physical activity:    Days per week: Not on file    Minutes per session: Not on file  . Stress: Not on file  Relationships  . Social connections:    Talks on phone: Not on file    Gets together: Not on file    Attends  religious service: Not on file    Active member of club or organization: Not on file    Attends meetings of clubs or organizations: Not on file    Relationship status: Not on file  Other Topics Concern  . 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  Lives with parents.   Tobacco Counseling Counseling given: Not Answered   Clinical Intake:     Pain Score: 0-No pain                 Activities of Daily Living In your present state of health, do you have any difficulty performing the following activities: 09/30/2018  Hearing? N  Vision? N  Difficulty concentrating or making decisions? Y  Walking or climbing stairs? N  Dressing or bathing? N  Doing errands, shopping? Y  Some recent data might be hidden     Immunizations and Health Maintenance There is no immunization history for the selected administration types on file for this patient. Health Maintenance Due  Topic Date Due  . Samul Dada  05/04/1975    Patient Care Team: Glendale Chard, MD as PCP - General (Internal Medicine)  Indicate any recent Medical Services you may have received from other than [facility] providers in the past year (date may be  approximate).    Assessment:   This is a routine wellness examination for [employer].  Hearing/Vision screen No exam data present  Dietary issues and exercise activities discussed:    Goals    . Exercise 3x per week (30 min per time)     Mother would like for him to exercise more regularly      Depression Screen PHQ 2/9 Scores 09/30/2018  PHQ - 2 Score 1  PHQ- 9 Score 2    Fall Risk Fall Risk  09/30/2018  Falls in the past year? 0    Is the patient's home free of loose throw rugs in walkways, pet beds, electrical cords, etc?   yes      Grab bars in the bathroom? yes      Handrails on the  stairs?   yes      Adequate lighting?   yes  Timed Get Up and Go performed: normal  Cognitive Function:        Screening Tests Health Maintenance  Topic Date Due  . TETANUS/TDAP  05/04/1973  . INFLUENZA VACCINE  01/10/2019 (Originally 05/12/2018)  . Hepatitis C Screening  10/01/2019 (Originally September 13, 1956)  . HIV Screening  10/01/2019 (Originally 05/04/1969)  . COLONOSCOPY  01/28/2027    Qualifies for Shingles Vaccine? Declined  Cancer Screenings: Lung: Low Dose CT Chest recommended if Age 13-80 years, 30 pack-year currently smoking OR have quit w/in 15years. Patient does not qualify. Colorectal: 2018  Additional Screenings:  Hepatitis C Screening: declined      Plan:    See above  I have personally reviewed and noted the following in the patient's chart:   . Medical and social history . Use of alcohol, tobacco or illicit drugs  . Current medications and supplements . Functional ability and status . Nutritional status . Physical activity . Advanced directives . List of other physicians . Hospitalizations, surgeries, and ER visits in previous 12 months . Vitals . Screenings to include cognitive, depression, and falls . Referrals and appointments  In addition, I have reviewed and discussed with patient certain preventive protocols, quality metrics, and best practice recommendations. A written personalized care plan for preventive services as well as general preventive health recommendations were provided to patient.     Minette Brine, FNP   09/30/2018

## 2018-10-01 LAB — CBC
HEMOGLOBIN: 14.8 g/dL (ref 13.0–17.7)
Hematocrit: 42.2 % (ref 37.5–51.0)
MCH: 35.3 pg — AB (ref 26.6–33.0)
MCHC: 35.1 g/dL (ref 31.5–35.7)
MCV: 101 fL — AB (ref 79–97)
Platelets: 231 10*3/uL (ref 150–450)
RBC: 4.19 x10E6/uL (ref 4.14–5.80)
RDW: 12.8 % (ref 12.3–15.4)
WBC: 6.2 10*3/uL (ref 3.4–10.8)

## 2018-10-01 LAB — BMP8+EGFR
BUN/Creatinine Ratio: 22 (ref 10–24)
BUN: 20 mg/dL (ref 8–27)
CO2: 26 mmol/L (ref 20–29)
CREATININE: 0.9 mg/dL (ref 0.76–1.27)
Calcium: 9.6 mg/dL (ref 8.6–10.2)
Chloride: 105 mmol/L (ref 96–106)
GFR, EST AFRICAN AMERICAN: 104 mL/min/{1.73_m2} (ref >59)
GFR, EST NON AFRICAN AMERICAN: 90 mL/min/{1.73_m2} (ref >59)
Glucose: 81 mg/dL (ref 65–99)
Potassium: 5 mmol/L (ref 3.5–5.2)
Sodium: 145 mmol/L — ABNORMAL HIGH (ref 134–144)

## 2018-10-01 LAB — LIPID PANEL
CHOL/HDL RATIO: 3.1 ratio (ref 0.0–5.0)
Cholesterol, Total: 221 mg/dL — ABNORMAL HIGH (ref 100–199)
HDL: 72 mg/dL (ref >39)
LDL CALC: 134 mg/dL — AB (ref 0–99)
Triglycerides: 75 mg/dL (ref 0–149)
VLDL CHOLESTEROL CAL: 15 mg/dL (ref 5–40)

## 2018-10-01 LAB — VITAMIN D 25 HYDROXY (VIT D DEFICIENCY, FRACTURES): VIT D 25 HYDROXY: 17.1 ng/mL — AB (ref 30.0–100.0)

## 2018-10-02 LAB — URINE CULTURE

## 2018-10-11 MED ORDER — TETANUS-DIPHTH-ACELL PERTUSSIS 5-2.5-18.5 LF-MCG/0.5 IM SUSP: 0.5000 mL | Freq: Once | INTRAMUSCULAR | 0 refills | Status: AC

## 2018-10-24 IMAGING — DX DG SHOULDER 1V*L*
1 series · 1 of 1 positions shown · non-contrast
Comparison: None.

CLINICAL DATA: Status post shoulder replacement

EXAM:
LEFT SHOULDER - 1 VIEW

[shoulder]
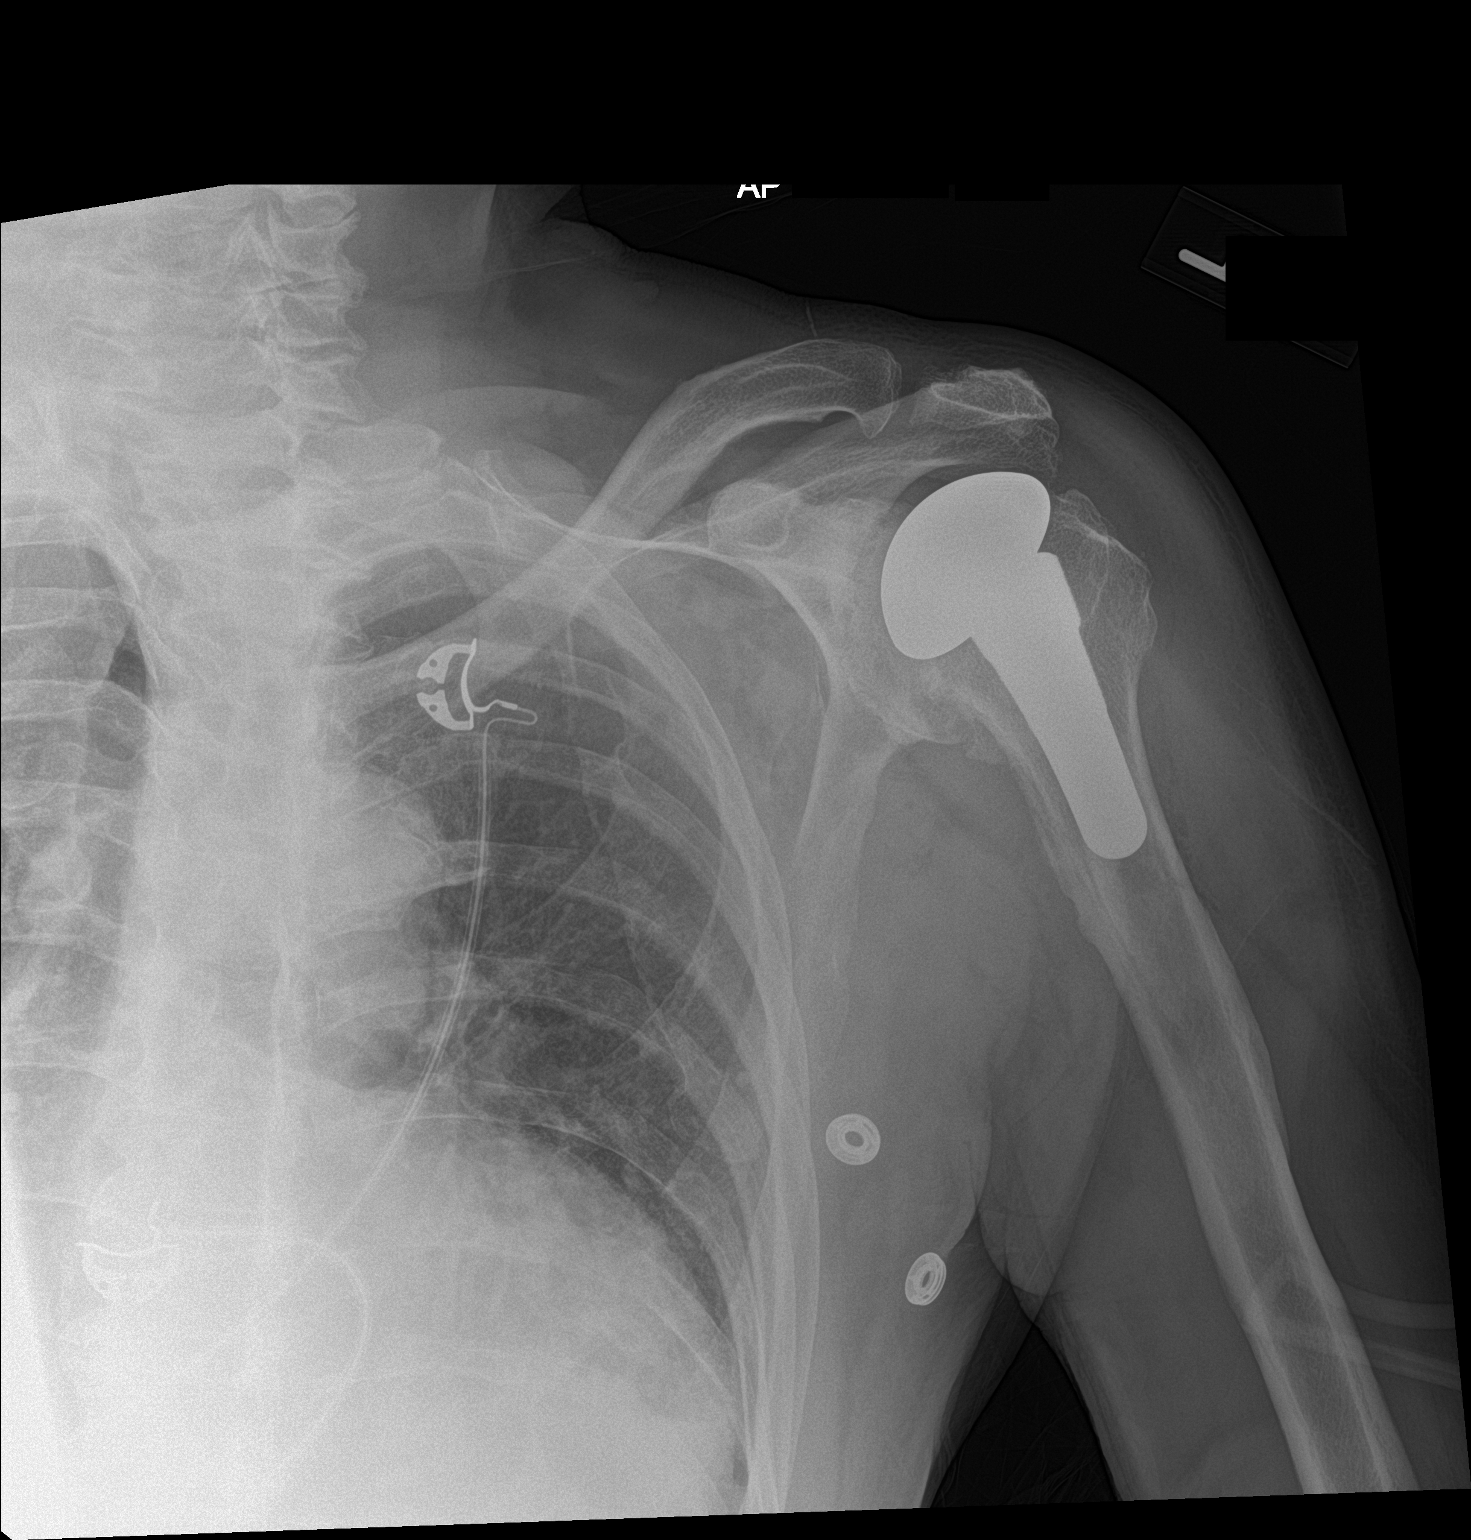

[1 of 1 positions shown; findings below may reference images not displayed]

FINDINGS: Left proximal humeral prosthesis is noted in satisfactory position.
Degenerative changes of the glenohumeral joint are noted.
Degenerative changes of the acromioclavicular joint are seen as
well. No other focal abnormality is noted.
IMPRESSION: Status post left shoulder replacement.

## 2019-06-15 DIAGNOSIS — Z85828 Personal history of other malignant neoplasm of skin: Secondary | ICD-10-CM | POA: Diagnosis not present

## 2019-06-15 DIAGNOSIS — D229 Melanocytic nevi, unspecified: Secondary | ICD-10-CM | POA: Diagnosis not present

## 2019-06-15 DIAGNOSIS — L821 Other seborrheic keratosis: Secondary | ICD-10-CM | POA: Diagnosis not present

## 2019-06-15 DIAGNOSIS — D1801 Hemangioma of skin and subcutaneous tissue: Secondary | ICD-10-CM | POA: Diagnosis not present

## 2019-06-22 ENCOUNTER — Telehealth: Payer: Self-pay | Admitting: Internal Medicine

## 2019-06-22 NOTE — Telephone Encounter (Signed)
I left a message asking pt to call and schedule AWV [facility] calendar year). VDM (DD)

## 2019-07-11 ENCOUNTER — Ambulatory Visit: Payer: Medicare Other

## 2019-08-10 ENCOUNTER — Ambulatory Visit: Payer: Medicare Other

## 2019-08-21 ENCOUNTER — Telehealth: Payer: Self-pay

## 2019-08-21 NOTE — Telephone Encounter (Signed)
Joe [name] CALLED TO VERIFY PT APPT

## 2019-10-11 ENCOUNTER — Ambulatory Visit: Payer: Medicare Other

## 2019-10-11 ENCOUNTER — Ambulatory Visit (INDEPENDENT_AMBULATORY_CARE_PROVIDER_SITE_OTHER): Payer: Medicare Other

## 2019-10-11 ENCOUNTER — Encounter: Payer: Self-pay | Admitting: Nurse Practitioner

## 2019-10-11 ENCOUNTER — Ambulatory Visit (INDEPENDENT_AMBULATORY_CARE_PROVIDER_SITE_OTHER): Payer: Medicare Other | Admitting: Nurse Practitioner

## 2019-10-11 ENCOUNTER — Other Ambulatory Visit: Payer: Self-pay

## 2019-10-11 VITALS — BP 142/60 | HR 68 | Temp 97.9°F | Ht 65.2 in | Wt 171.2 lb

## 2019-10-11 VITALS — BP 142/60 | HR 68 | Temp 97.9°F | Ht 65.2 in | Wt 171.0 lb

## 2019-10-11 DIAGNOSIS — H6123 Impacted cerumen, bilateral: Secondary | ICD-10-CM

## 2019-10-11 DIAGNOSIS — Z0000 Encounter for general adult medical examination without abnormal findings: Secondary | ICD-10-CM

## 2019-10-11 DIAGNOSIS — E782 Mixed hyperlipidemia: Secondary | ICD-10-CM | POA: Diagnosis not present

## 2019-10-11 DIAGNOSIS — Z23 Encounter for immunization: Secondary | ICD-10-CM | POA: Diagnosis not present

## 2019-10-11 DIAGNOSIS — E559 Vitamin D deficiency, unspecified: Secondary | ICD-10-CM

## 2019-10-11 DIAGNOSIS — R350 Frequency of micturition: Secondary | ICD-10-CM | POA: Diagnosis not present

## 2019-10-11 LAB — POCT URINALYSIS DIPSTICK
Bilirubin, UA: NEGATIVE
Blood, UA: NEGATIVE
Glucose, UA: NEGATIVE
Ketones, UA: NEGATIVE
Nitrite, UA: NEGATIVE
Protein, UA: NEGATIVE
Spec Grav, UA: 1.01 (ref 1.010–1.025)
Urobilinogen, UA: 0.2 E.U./dL
pH, UA: 7 (ref 5.0–8.0)

## 2019-10-11 MED ORDER — BOOSTRIX 5-2.5-18.5 LF-MCG/0.5 IM SUSP: 0.5000 mL | Freq: Once | INTRAMUSCULAR | 0 refills | Status: AC

## 2019-10-11 NOTE — Patient Instructions (Signed)
[name] , Thank you for taking time to come for your Medicare Wellness Visit. I appreciate your ongoing commitment to your health goals. Please review the following plan we discussed and let me know if I can assist you in the future.   Screening recommendations/referrals: Colonoscopy: 04/2017 Recommended yearly ophthalmology/optometry visit for glaucoma screening and checkup Recommended yearly dental visit for hygiene and checkup  Vaccinations: Influenza vaccine: declines Pneumococcal vaccine: declines Tdap vaccine: sent to pharmacy Shingles vaccine: discussed    Advanced directives: Please bring a copy of your POA (Power of Attorney) and/or Living Will to your next appointment.    Conditions/risks identified: over weight  Next appointment: 04/09/2020 at 2:15  Preventive Care 65 Years and Older, Male Preventive care refers to lifestyle choices and visits with your health care provider that can promote health and wellness. What does preventive care include?  A yearly physical exam. This is also called an annual well check.  Dental exams once or twice a year.  Routine eye exams. Ask your health care provider how often you should have your eyes checked.  Personal lifestyle choices, including:  Daily care of your teeth and gums.  Regular physical activity.  Eating a healthy diet.  Avoiding tobacco and drug use.  Limiting alcohol use.  Practicing safe sex.  Taking low doses of aspirin every day.  Taking vitamin and mineral supplements as recommended by your health care provider. What happens during an annual well check? The services and screenings done by your health care provider during your annual well check will depend on your age, overall health, lifestyle risk factors, and family history of disease. Counseling  Your health care provider may ask you questions about your:  Alcohol use.  Tobacco use.  Drug use.  Emotional well-being.  Home and relationship  well-being.  Sexual activity.  Eating habits.  History of falls.  Memory and ability to understand (cognition).  Work and work Statistician. Screening  You may have the following tests or measurements:  Height, weight, and BMI.  Blood pressure.  Lipid and cholesterol levels. These may be checked every 5 years, or more frequently if you are over 96 years old.  Skin check.  Lung cancer screening. You may have this screening every year starting at age 86 if you have a 30-pack-year history of smoking and currently smoke or have quit within the past 15 years.  Fecal occult blood test (FOBT) of the stool. You may have this test every year starting at age 81.  Flexible sigmoidoscopy or colonoscopy. You may have a sigmoidoscopy every 5 years or a colonoscopy every 10 years starting at age 84.  Prostate cancer screening. Recommendations will vary depending on your family history and other risks.  Hepatitis C blood test.  Hepatitis B blood test.  Sexually transmitted disease (STD) testing.  Diabetes screening. This is done by checking your blood sugar (glucose) after you have not eaten for a while (fasting). You may have this done every 1-3 years.  Abdominal aortic aneurysm (AAA) screening. You may need this if you are a current or former smoker.  Osteoporosis. You may be screened starting at age 72 if you are at high risk. Talk with your health care provider about your test results, treatment options, and if necessary, the need for more tests. Vaccines  Your health care provider may recommend certain vaccines, such as:  Influenza vaccine. This is recommended every year.  Tetanus, diphtheria, and acellular pertussis (Tdap, Td) vaccine. You may need a  Td booster every 10 years.  Zoster vaccine. You may need this after age 16.  Pneumococcal 13-valent conjugate (PCV13) vaccine. One dose is recommended after age 23.  Pneumococcal polysaccharide (PPSV23) vaccine. One dose is  recommended after age 60. Talk to your health care provider about which screenings and vaccines you need and how often you need them. This information is not intended to replace advice given to you by your health care provider. Make sure you discuss any questions you have with your health care provider. Document Released: 10/25/2015 Document Revised: 06/17/2016 Document Reviewed: 07/30/2015 Elsevier Interactive Patient Education  2017 [place] Prevention in the Home Falls can cause injuries. They can happen to people of all ages. There are many things you can do to make your home safe and to help prevent falls. What can I do on the outside of my home?  Regularly fix the edges of walkways and driveways and fix any cracks.  Remove anything that might make you trip as you walk through a door, such as a raised step or threshold.  Trim any bushes or trees on the path to your home.  Use bright outdoor lighting.  Clear any walking paths of anything that might make someone trip, such as rocks or tools.  Regularly check to see if handrails are loose or broken. Make sure that both sides of any steps have handrails.  Any raised decks and porches should have guardrails on the edges.  Have any leaves, snow, or ice cleared regularly.  Use sand or salt on walking paths during winter.  Clean up any spills in your garage right away. This includes oil or grease spills. What can I do in the bathroom?  Use night lights.  Install grab bars by the toilet and in the tub and shower. Do not use towel bars as grab bars.  Use non-skid mats or decals in the tub or shower.  If you need to sit down in the shower, use a plastic, non-slip stool.  Keep the floor dry. Clean up any water that spills on the floor as soon as it happens.  Remove soap buildup in the tub or shower regularly.  Attach bath mats securely with double-sided non-slip rug tape.  Do not have throw rugs and other things on  the floor that can make you trip. What can I do in the bedroom?  Use night lights.  Make sure that you have a light by your bed that is easy to reach.  Do not use any sheets or blankets that are too big for your bed. They should not hang down onto the floor.  Have a firm chair that has side arms. You can use this for support while you get dressed.  Do not have throw rugs and other things on the floor that can make you trip. What can I do in the kitchen?  Clean up any spills right away.  Avoid walking on wet floors.  Keep items that you use a lot in easy-to-reach places.  If you need to reach something above you, use a strong step stool that has a grab bar.  Keep electrical cords out of the way.  Do not use floor polish or wax that makes floors slippery. If you must use wax, use non-skid floor wax.  Do not have throw rugs and other things on the floor that can make you trip. What can I do with my stairs?  Do not leave any items on the stairs.  Make sure that there are handrails on both sides of the stairs and use them. Fix handrails that are broken or loose. Make sure that handrails are as long as the stairways.  Check any carpeting to make sure that it is firmly attached to the stairs. Fix any carpet that is loose or worn.  Avoid having throw rugs at the top or bottom of the stairs. If you do have throw rugs, attach them to the floor with carpet tape.  Make sure that you have a light switch at the top of the stairs and the bottom of the stairs. If you do not have them, ask someone to add them for you. What else can I do to help prevent falls?  Wear shoes that:  Do not have high heels.  Have rubber bottoms.  Are comfortable and fit you well.  Are closed at the toe. Do not wear sandals.  If you use a stepladder:  Make sure that it is fully opened. Do not climb a closed stepladder.  Make sure that both sides of the stepladder are locked into place.  Ask someone to  hold it for you, if possible.  Clearly [place] and make sure that you can see:  Any grab bars or handrails.  First and last steps.  Where the edge of each step is.  Use tools that help you move around (mobility aids) if they are needed. These include:  Canes.  Walkers.  Scooters.  Crutches.  Turn on the lights when you go into a dark area. Replace any light bulbs as soon as they burn out.  Set up your furniture so you have a clear path. Avoid moving your furniture around.  If any of your floors are uneven, fix them.  If there are any pets around you, be aware of where they are.  Review your medicines with your doctor. Some medicines can make you feel dizzy. This can increase your chance of falling. Ask your doctor what other things that you can do to help prevent falls. This information is not intended to replace advice given to you by your health care provider. Make sure you discuss any questions you have with your health care provider. Document Released: 07/25/2009 Document Revised: 03/05/2016 Document Reviewed: 11/02/2014 Elsevier Interactive Patient Education  2017 [employer].

## 2019-10-11 NOTE — Progress Notes (Signed)
This visit occurred during the SARS-CoV-2 public health emergency.  Safety protocols were in place, including screening questions prior to the visit, additional usage of staff PPE, and extensive cleaning of exam room while observing appropriate contact time as indicated for disinfecting solutions.  Subjective:     Patient ID: Joe [name] , male    DOB: [birth year] , 63 y.o.   MRN: [id number]   Chief Complaint  Patient presents with  . Annual Exam    HPI  HPI   Past Medical History:  Diagnosis Date  . Anxiety    over surgery  . Arthritis    BACK AND SHOULDER  . Cognitive deficits   . Congenital brain damage ([place])   . Constipation   . Expressive speech delay   . History of kidney stones   . 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 Osteoarthritis of left hip 01/16/2014  . Osteoarthritis of right hip 05/15/2014  . Primary localized osteoarthrosis of left shoulder 09/21/2017  . Right ureteral stone   . Speech impediment   . Umbilical hernia      No family history on file.   Current Outpatient Medications:  .  Acetaminophen (TYLENOL PO), Take 2 tablets by mouth every 8 (eight) hours as needed (pain)., Disp: , Rfl:  .  CALCIUM-VITAMIN D PO, Take 2 tablets by mouth daily., Disp: , Rfl:  .  Cyanocobalamin (B-12) 5000 MCG CAPS, Take 5,000 mcg by mouth daily., Disp: , Rfl:  .  docusate sodium (COLACE) 100 MG capsule, Take 100 mg by mouth daily., Disp: , Rfl:  .  fluticasone (FLONASE) 50 MCG/ACT nasal spray, Place into the nose., Disp: , Rfl:  .  Multiple Vitamin (MULTIVITAMIN WITH MINERALS) TABS tablet, Take 1 tablet by mouth daily., Disp: , Rfl:  .  omeprazole (PRILOSEC) 40 MG capsule, Take 40 mg by mouth daily., Disp: , Rfl:  .  sennosides-docusate sodium (SENOKOT-S) 8.6-50 MG tablet, Take 2 tablets by mouth daily., Disp: 30 tablet, Rfl: 1 .  Tdap (BOOSTRIX) 5-2.5-18.5 LF-MCG/0.5 injection, Inject 0.5 mLs into the muscle once for 1  dose., Disp: 0.5 mL, Rfl: 0 .  vitamin C (ASCORBIC ACID) 500 MG tablet, Take 500 mg by mouth daily., Disp: , Rfl:    Allergies  Allergen Reactions  . Codeine Nausea Only  . Levaquin [Levofloxacin In D5w] Diarrhea     Review of Systems  Constitutional: Negative.   Eyes: Negative.   Respiratory: Negative.  Negative for cough.   Cardiovascular: Negative.   Gastrointestinal: Negative.   Endocrine: Negative.   Genitourinary: Negative.   Musculoskeletal: Negative.   Skin: Negative.   Allergic/Immunologic: Negative.   Neurological: Negative.   Hematological: Negative.   Psychiatric/Behavioral: Negative.      Today's Vitals   10/11/19 1415  BP: (!) 142/60  Pulse: 68  Temp: 97.9 F (36.6 C)  TempSrc: Oral  Weight: 171 lb (77.6 kg)  Height: 5' 5.2" (1.656 m)   Body mass index is 28.28 kg/m.   Objective:  Physical Exam Constitutional:      Appearance: Normal appearance.  HENT:     Head: Normocephalic and atraumatic.  Cardiovascular:     Rate and Rhythm: Normal rate and regular rhythm.     Pulses: Normal pulses.     Heart sounds: Normal heart sounds. No murmur.  Pulmonary:     Effort: Pulmonary effort is normal. No respiratory distress.     Breath sounds:  Normal breath sounds.  Skin:    General: Skin is warm.     Capillary Refill: Capillary refill takes less than 2 seconds.  Neurological:     General: No focal deficit present.     Mental Status: He is alert and oriented to person, place, and time.         Assessment And Plan:     1. Vitamin D deficiency  Will check vitamin D level and supplement as needed.     Also encouraged to spend 15 minutes in the sun daily.  - Vitamin D (25 hydroxy)  2. Mixed hyperlipidemia Chronic, controlled No current medications, diet controlled - Lipid Profile - BMP8+eGFR  3. Excessive cerumen in both ear canals  Mother will administer debrox gtts 2-3 times a week  4. Encounter for general adult medical examination w/o  abnormal findings . Behavior modifications discussed and diet history reviewed.   . Pt will continue to exercise regularly and modify diet with low GI, plant based foods and decrease intake of processed foods.  . Recommend intake of daily multivitamin, Vitamin D, and calcium.  . Recommend for preventive screenings, as well as recommend immunizations that include influenza (declines), TDAP  - POCT Urinalysis Dipstick (81002)  5. Urinary frequency  He has had a urinary tract infection in the past without symptoms I will check today for a urinalysis due to symptoms - Culture, Urine   Minette Brine, FNP    THE PATIENT IS ENCOURAGED TO PRACTICE SOCIAL DISTANCING DUE TO THE COVID-19 PANDEMIC.

## 2019-10-11 NOTE — Progress Notes (Signed)
This visit occurred during the SARS-CoV-2 public health emergency.  Safety protocols were in place, including screening questions prior to the visit, additional usage of staff PPE, and extensive cleaning of exam room while observing appropriate contact time as indicated for disinfecting solutions.  Subjective:   Joe [name] is a 63 y.o. male who presents for Medicare Annual/Subsequent preventive examination.  Review of Systems:  n/a Cardiac Risk Factors include: advanced age (>47men, >67 women);male gender     Objective:    Vitals: BP (!) 142/60 (BP Location: Right Arm, Patient Position: Sitting, Cuff Size: Normal)   Pulse 68   Temp 97.9 F (36.6 C) (Oral)   Ht 5' 5.2" (1.656 m)   Wt 171 lb 3.2 oz (77.7 kg)   SpO2 99%   BMI 28.31 kg/m   Body mass index is 28.31 kg/m.  Advanced Directives 10/11/2019 09/30/2018 09/10/2017 05/15/2014 05/04/2014 04/19/2013 03/24/2013  Does Patient Have a Medical Advance Directive? Yes No Yes Patient has advance directive, copy in chart Patient has advance directive, copy in chart Patient has advance directive, copy not in chart Patient has advance directive, copy not in chart  Type of Advance Directive [place];Living will - [place];Living will [place];Living will [place];Living will [place];Living will [place];Living will  Does patient want to make changes to medical advance directive? - - - No change requested No change requested - -  Copy of [place] in Chart? No - copy requested - - - - Copy requested from family Copy requested from family  Would patient like information on creating a medical advance directive? - Yes (MAU/Ambulatory/Procedural Areas - Information given) - - - - -  Pre-existing out of facility DNR order (yellow form or pink MOST form) - - - - - - No    Tobacco Social History   Tobacco Use    Smoking Status Never Smoker  Smokeless Tobacco Never Used     Counseling given: Not Answered   Clinical Intake:  Pre-visit preparation completed: Yes  Pain : No/denies pain     Nutritional Status: BMI 25 -29 Overweight Nutritional Risks: None Diabetes: No        Information entered by :: NAllen LPN  Past Medical History:  Diagnosis Date  . Anxiety    over surgery  . Arthritis    BACK AND SHOULDER  . Cognitive deficits   . Congenital brain damage ([place])   . Constipation   . Expressive speech delay   . History of kidney stones   . 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 Osteoarthritis of left hip 01/16/2014  . Osteoarthritis of right hip 05/15/2014  . Primary localized osteoarthrosis of left shoulder 09/21/2017  . Right ureteral stone   . Speech impediment   . Umbilical hernia    Past Surgical History:  Procedure Laterality Date  . BACK SURGERY    . CYSTOSCOPY W/ URETERAL STENT REMOVAL Right 04/19/2013   Procedure: CYSTOSCOPY WITH STENT REMOVAL;  Surgeon: Molli Hazard, MD;  Location[facility];  Service: Urology;  Laterality: Right;  . CYSTOSCOPY WITH RETROGRADE PYELOGRAM, URETEROSCOPY AND STENT PLACEMENT Right 04/19/2013   Procedure: CYSTOSCOPY WITH RETROGRADE PYELOGRAM, URETEROSCOPY ;  Surgeon: Molli Hazard, MD;  Location: [facility];  Service: Urology;  Laterality: Right;  . CYSTOSCOPY WITH STENT PLACEMENT Right 03/24/2013  Procedure: CYSTOSCOPY WITH STENT PLACEMENT;  Surgeon: Hanley Ben, MD;  Location: [facility];  Service: Urology;  Laterality: Right;  . HOLMIUM LASER APPLICATION Right 0000000   Procedure: HOLMIUM LASER APPLICATION;  Surgeon: Molli Hazard, MD;  [facility]  Service: Urology;  Laterality: Right;  . INGUINAL HERNIA REPAIR Left 03-04-2005  . POSTERIOR LUMBAR FUSION  08-27-2011   L4 -- L5  . RIGHT SHOULDER  HEMIARTHROPLASTY  08-26-2010   OA  . SHOULDER HEMI-ARTHROPLASTY Left 09/21/2017   Procedure: SHOULDER HEMI-ARTHROPLASTY;  Surgeon: Marchia Bond, MD;  Location: [place];  Service: Orthopedics;  Laterality: Left;  . TONSILLECTOMY  AS CHILD  . TOTAL HIP ARTHROPLASTY Left 01/16/2014   Procedure: TOTAL HIP ARTHROPLASTY;  Surgeon: Johnny Bridge, MD;  Location: [place];  Service: Orthopedics;  Laterality: Left;  . TOTAL HIP ARTHROPLASTY Right 05/15/2014   Procedure: RIGHT TOTAL HIP ARTHROPLASTY;  Surgeon: Johnny Bridge, MD;  Location: [place];  Service: Orthopedics;  Laterality: Right;  . TOTAL SHOULDER ARTHROPLASTY Right 2011   History reviewed. No pertinent family history. Social History   Socioeconomic History  . Marital status: Single    Spouse name: Not on file  . Number of children: Not on file  . Years of education: Not on file  . Highest education level: Not on file  Occupational History  . Occupation: unemployed  Tobacco Use  . Smoking status: Never Smoker  . Smokeless tobacco: Never Used  Substance and Sexual Activity  . Alcohol use: No  . Drug use: No  . Sexual activity: Not Currently  Other Topics Concern  . 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  Lives with parents.   Social Determinants of Health   Financial Resource Strain: Low Risk   . Difficulty of Paying Living Expenses: Not hard at all  Food Insecurity: No Food Insecurity  . Worried About Charity fundraiser in the Last Year: Never true  . Ran Out of Food in the Last Year: Never true  Transportation Needs: No Transportation Needs  . Lack of Transportation (Medical): No  . Lack of Transportation (Non-Medical): No  Physical Activity: Sufficiently Active  . Days of Exercise per Week: 5 days  . Minutes of Exercise per Session: 40 min  Stress:  Stress Concern Present  . Feeling of Stress : To some extent  Social Connections:   . Frequency of Communication with Friends and Family: Not on file  . Frequency of Social Gatherings with Friends and Family: Not on file  . Attends Religious Services: Not on file  . Active Member of Clubs or Organizations: Not on file  . Attends Archivist Meetings: Not on file  . Marital Status: Not on file    Outpatient Encounter Medications as of 10/11/2019  Medication Sig  . Acetaminophen (TYLENOL PO) Take 2 tablets by mouth every 8 (eight) hours as needed (pain).  . CALCIUM-VITAMIN D PO Take 2 tablets by mouth daily.  . Cyanocobalamin (B-12) 5000 MCG CAPS Take 5,000 mcg by mouth daily.  [name] Kitchen docusate sodium (COLACE) 100 MG capsule Take 100 mg by mouth daily.  . fluticasone (FLONASE) 50 MCG/ACT nasal spray Place into the nose.  . Multiple Vitamin (MULTIVITAMIN WITH MINERALS) TABS tablet Take 1 tablet by mouth daily.  [name] Kitchen omeprazole (  PRILOSEC) 40 MG capsule Take 40 mg by mouth daily.  . sennosides-docusate sodium (SENOKOT-S) 8.6-50 MG tablet Take 2 tablets by mouth daily.  . vitamin C (ASCORBIC ACID) 500 MG tablet Take 500 mg by mouth daily.  . Tdap (BOOSTRIX) 5-2.5-18.5 LF-MCG/0.5 injection Inject 0.5 mLs into the muscle once for 1 dose.   No facility-administered encounter medications on file as of 10/11/2019.    Activities of Daily Living In your present state of health, do you have any difficulty performing the following activities: 10/11/2019  Hearing? Y  Comment right ear gives trouble  Vision? N  Difficulty concentrating or making decisions? Y  Walking or climbing stairs? N  Dressing or bathing? N  Doing errands, shopping? Y  Preparing Food and eating ? Y  Using the Toilet? N  In the past six months, have you accidently leaked urine? N  Do you have problems with loss of bowel control? N  Managing your Medications? Y  Comment mother manages  Housekeeping or managing your  Housekeeping? Y  Some recent data might be hidden    Patient Care Team: Minette Brine, FNP as PCP - General (General Practice)   Assessment:   This is a routine wellness examination for [employer].  Exercise Activities and Dietary recommendations Current Exercise Habits: Home exercise routine, Type of exercise: walking, Time (Minutes): 45, Frequency (Times/Week): 5, Weekly Exercise (Minutes/Week): 225  Goals    . Exercise 3x per week (30 min per time)     Mother would like for him to exercise more regularly    . Patient Stated     10/11/2019 maintain current health       Fall Risk Fall Risk  10/11/2019 09/30/2018  Falls in the past year? 1 0  Risk for fall due to : History of fall(s) -  Follow up Falls evaluation completed;Education provided;Falls prevention discussed -   Is the patient's home free of loose throw rugs in walkways, pet beds, electrical cords, etc?   yes      Grab bars in the bathroom? yes      Handrails on the stairs?   yes      Adequate lighting?   yes  Timed Get Up and Go Performed: n/a  Depression Screen PHQ 2/9 Scores 10/11/2019 09/30/2018  PHQ - 2 Score 1 1  PHQ- 9 Score 7 2    Cognitive Function        There is no immunization history for the selected administration types on file for this patient.  Qualifies for Shingles Vaccine? yes  Screening Tests Health Maintenance  Topic Date Due  . Hepatitis C Screening  1956/08/14  . TETANUS/TDAP  05/04/1973  . INFLUENZA VACCINE  01/10/2020 (Originally 05/13/2019)  . PNA vac Low Risk Adult (1 of 2 - PCV13) 10/10/2020 (Originally 05/05/2019)  . COLONOSCOPY  01/28/2027  . HIV Screening  Completed   Cancer Screenings: Lung: Low Dose CT Chest recommended if Age 90-80 years, 30 pack-year currently smoking OR have quit w/in 15years. Patient does not qualify. Colorectal: up to date  Additional Screenings:  Hepatitis C Screening:     . Plan:    Patient wants to maintain health. Patient has cognitive  deficits. 6 CIT was not administered.  I have personally reviewed and noted the following in the patient's chart:   . Medical and social history . Use of alcohol, tobacco or illicit drugs  . Current medications and supplements . Functional ability and status . Nutritional status . Physical activity .  Advanced directives . List of other physicians . Hospitalizations, surgeries, and ER visits in previous 12 months . Vitals . Screenings to include cognitive, depression, and falls . Referrals and appointments  In addition, I have reviewed and discussed with patient certain preventive protocols, quality metrics, and best practice recommendations. A written personalized care plan for preventive services as well as general preventive health recommendations were provided to patient.     Kellie Simmering, LPN  [id number]

## 2019-10-11 NOTE — Patient Instructions (Signed)
Health Maintenance  Topic Date Due  . TETANUS/TDAP  05/04/1973  . INFLUENZA VACCINE  01/10/2020 (Originally 05/13/2019)  . Hepatitis C Screening  10/10/2020 (Originally 1956-03-29)  . PNA vac Low Risk Adult (1 of 2 - PCV13) 10/10/2020 (Originally 05/05/2019)  . COLONOSCOPY  01/28/2027  . HIV Screening  Completed   Health Maintenance After Age 65 After age 97, you are at a higher risk for certain long-term diseases and infections as well as injuries from falls. Falls are a major cause of broken bones and head injuries in people who are older than age 40. Getting regular preventive care can help to keep you healthy and well. Preventive care includes getting regular testing and making lifestyle changes as recommended by your health care provider. Talk with your health care provider about:  Which screenings and tests you should have. A screening is a test that checks for a disease when you have no symptoms.  A diet and exercise plan that is right for you. What should I know about screenings and tests to prevent falls? Screening and testing are the best ways to find a health problem early. Early diagnosis and treatment give you the best chance of managing medical conditions that are common after age 6. Certain conditions and lifestyle choices may make you more likely to have a fall. Your health care provider may recommend:  Regular vision checks. Poor vision and conditions such as cataracts can make you more likely to have a fall. If you wear glasses, make sure to get your prescription updated if your vision changes.  Medicine review. Work with your health care provider to regularly review all of the medicines you are taking, including over-the-counter medicines. Ask your health care provider about any side effects that may make you more likely to have a fall. Tell your health care provider if any medicines that you take make you feel dizzy or sleepy.  Osteoporosis screening. Osteoporosis is a  condition that causes the bones to get weaker. This can make the bones weak and cause them to break more easily.  Blood pressure screening. Blood pressure changes and medicines to control blood pressure can make you feel dizzy.  Strength and balance checks. Your health care provider may recommend certain tests to check your strength and balance while standing, walking, or changing positions.  Foot health exam. Foot pain and numbness, as well as not wearing proper footwear, can make you more likely to have a fall.  Depression screening. You may be more likely to have a fall if you have a fear of falling, feel emotionally low, or feel unable to do activities that you used to do.  Alcohol use screening. Using too much alcohol can affect your balance and may make you more likely to have a fall. What actions can I take to lower my risk of falls? General instructions  Talk with your health care provider about your risks for falling. Tell your health care provider if: ? You fall. Be sure to tell your health care provider about all falls, even ones that seem minor. ? You feel dizzy, sleepy, or off-balance.  Take over-the-counter and prescription medicines only as told by your health care provider. These include any supplements.  Eat a healthy diet and maintain a healthy weight. A healthy diet includes low-fat dairy products, low-fat (lean) meats, and fiber from whole grains, beans, and lots of fruits and vegetables. Home safety  Remove any tripping hazards, such as rugs, cords, and clutter.  Install safety  equipment such as grab bars in bathrooms and safety rails on stairs.  Keep rooms and walkways well-lit. Activity   Follow a regular exercise program to stay fit. This will help you maintain your balance. Ask your health care provider what types of exercise are appropriate for you.  If you need a cane or walker, use it as recommended by your health care provider.  Wear supportive shoes  that have nonskid soles. Lifestyle  Do not drink alcohol if your health care provider tells you not to drink.  If you drink alcohol, limit how much you have: ? 0-1 drink a day for women. ? 0-2 drinks a day for men.  Be aware of how much alcohol is in your drink. In the [place] one drink equals one typical bottle of beer (12 oz), one-half glass of wine (5 oz), or one shot of hard liquor (1 oz).  Do not use any products that contain nicotine or tobacco, such as cigarettes and e-cigarettes. If you need help quitting, ask your health care provider. Summary  Having a healthy lifestyle and getting preventive care can help to protect your health and wellness after age 83.  Screening and testing are the best way to find a health problem early and help you avoid having a fall. Early diagnosis and treatment give you the best chance for managing medical conditions that are more common for people who are older than age 50.  Falls are a major cause of broken bones and head injuries in people who are older than age 84. Take precautions to prevent a fall at home.  Work with your health care provider to learn what changes you can make to improve your health and wellness and to prevent falls. This information is not intended to replace advice given to you by your health care provider. Make sure you discuss any questions you have with your health care provider. Document Released: 08/11/2017 Document Revised: 01/19/2019 Document Reviewed: 08/11/2017 Elsevier Patient Education  [place].    Use Debrox drops to ears 2-3 times a week to soften wax

## 2019-10-12 LAB — LIPID PANEL
Chol/HDL Ratio: 2.5 ratio (ref 0.0–5.0)
Cholesterol, Total: 203 mg/dL — ABNORMAL HIGH (ref 100–199)
HDL: 80 mg/dL (ref >39)
LDL Chol Calc (NIH): 109 mg/dL — ABNORMAL HIGH (ref 0–99)
Triglycerides: 79 mg/dL (ref 0–149)
VLDL Cholesterol Cal: 14 mg/dL (ref 5–40)

## 2019-10-12 LAB — BMP8+EGFR
BUN/Creatinine Ratio: 16 (ref 10–24)
BUN: 13 mg/dL (ref 8–27)
CO2: 22 mmol/L (ref 20–29)
Calcium: 9.6 mg/dL (ref 8.6–10.2)
Chloride: 104 mmol/L (ref 96–106)
Creatinine, Ser: 0.81 mg/dL (ref 0.76–1.27)
GFR calc Af Amer: 108 mL/min/{1.73_m2} (ref >59)
GFR calc non Af Amer: 93 mL/min/{1.73_m2} (ref >59)
Glucose: 69 mg/dL (ref 65–99)
Potassium: 4.6 mmol/L (ref 3.5–5.2)
Sodium: 141 mmol/L (ref 134–144)

## 2019-10-12 LAB — VITAMIN D 25 HYDROXY (VIT D DEFICIENCY, FRACTURES): Vit D, 25-Hydroxy: 29.8 ng/mL — ABNORMAL LOW (ref 30.0–100.0)

## 2019-10-13 LAB — URINE CULTURE

## 2019-10-17 ENCOUNTER — Other Ambulatory Visit: Payer: Self-pay | Admitting: Nurse Practitioner

## 2019-10-17 DIAGNOSIS — N3000 Acute cystitis without hematuria: Secondary | ICD-10-CM

## 2019-10-17 MED ORDER — SULFAMETHOXAZOLE-TRIMETHOPRIM 800-160 MG PO TABS: 1.0000 | ORAL_TABLET | Freq: Two times a day (BID) | ORAL | 1 refills | Status: DC

## 2019-10-25 ENCOUNTER — Other Ambulatory Visit: Payer: Medicare Other

## 2020-01-09 ENCOUNTER — Other Ambulatory Visit: Payer: Self-pay | Admitting: Nurse Practitioner

## 2020-01-09 ENCOUNTER — Other Ambulatory Visit: Payer: Self-pay

## 2020-01-09 ENCOUNTER — Encounter: Payer: Self-pay | Admitting: Nurse Practitioner

## 2020-01-09 ENCOUNTER — Ambulatory Visit (INDEPENDENT_AMBULATORY_CARE_PROVIDER_SITE_OTHER): Payer: Medicare Other | Admitting: Nurse Practitioner

## 2020-01-09 VITALS — BP 122/70 | HR 64 | Temp 98.2°F | Ht 64.4 in | Wt 171.0 lb

## 2020-01-09 DIAGNOSIS — R300 Dysuria: Secondary | ICD-10-CM | POA: Diagnosis not present

## 2020-01-09 DIAGNOSIS — N390 Urinary tract infection, site not specified: Secondary | ICD-10-CM

## 2020-01-09 DIAGNOSIS — G8929 Other chronic pain: Secondary | ICD-10-CM

## 2020-01-09 DIAGNOSIS — R519 Headache, unspecified: Secondary | ICD-10-CM

## 2020-01-09 DIAGNOSIS — R109 Unspecified abdominal pain: Secondary | ICD-10-CM | POA: Diagnosis not present

## 2020-01-09 DIAGNOSIS — R3911 Hesitancy of micturition: Secondary | ICD-10-CM | POA: Diagnosis not present

## 2020-01-09 LAB — POCT URINALYSIS DIPSTICK
Bilirubin, UA: NEGATIVE
Glucose, UA: NEGATIVE
Ketones, UA: NEGATIVE
Nitrite, UA: POSITIVE
Protein, UA: NEGATIVE
Spec Grav, UA: 1.01 (ref 1.010–1.025)
Urobilinogen, UA: 0.2 E.U./dL
pH, UA: 7 (ref 5.0–8.0)

## 2020-01-09 MED ORDER — MOMETASONE FUROATE 50 MCG/ACT NA SUSP
2.0000 | Freq: Every day | NASAL | 2 refills | Status: DC
Start: 2020-01-09 — End: 2022-05-26

## 2020-01-09 MED ORDER — CEFTRIAXONE SODIUM 1 G IJ SOLR
1.0000 g | Freq: Once | INTRAMUSCULAR | Status: AC
  Administered 2020-01-09: 10:00:00 1 g via INTRAMUSCULAR

## 2020-01-09 MED ORDER — FLUTICASONE PROPIONATE 50 MCG/ACT NA SUSP: 2.0000 | Freq: Every day | NASAL | 1 refills | Status: DC

## 2020-01-09 NOTE — Progress Notes (Signed)
This visit occurred during the SARS-CoV-2 public health emergency.  Safety protocols were in place, including screening questions prior to the visit, additional usage of staff PPE, and extensive cleaning of exam room while observing appropriate contact time as indicated for disinfecting solutions.  Subjective:     Patient ID: Joe [name] , male    DOB: [birth year] , 64 y.o.   MRN: [id number]   Chief Complaint  Patient presents with  . Dysuria    patient stated he has been having some urinary frequency, back pain    HPI  He has had kidney stones about 5 years ago seen by Urologist, had stents placed.  He had a urinary tract infection and the inability to urinate.    Dysuria  This is a recurrent problem. The current episode started 1 to 4 weeks ago. The problem occurs intermittently. The problem has been gradually worsening. The patient is experiencing no pain. He is not sexually active. There is no history of pyelonephritis. Associated symptoms include flank pain, frequency and hesitancy. Pertinent negatives include no chills or urgency. He has tried nothing for the symptoms. His past medical history is significant for recurrent UTIs. There is no history of catheterization.  Headache  This is a recurrent problem. The current episode started more than 1 month ago. The pain is located in the frontal region. The patient is experiencing no pain. Pertinent negatives include no abdominal pain, numbness, sore throat or tingling. He has tried acetaminophen for the symptoms. There is no history of cluster headaches.     Past Medical History:  Diagnosis Date  . Anxiety    over surgery  . Arthritis    BACK AND SHOULDER  . Cognitive deficits   . Congenital brain damage ([place])   . Constipation   . Expressive speech delay   . History of kidney stones   . 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 Osteoarthritis of left hip 01/16/2014  .  Osteoarthritis of right hip 05/15/2014  . Primary localized osteoarthrosis of left shoulder 09/21/2017  . Right ureteral stone   . Speech impediment   . Umbilical hernia      No family history on file.   Current Outpatient Medications:  .  Acetaminophen (TYLENOL PO), Take 2 tablets by mouth every 8 (eight) hours as needed (pain)., Disp: , Rfl:  .  CALCIUM-VITAMIN D PO, Take 2 tablets by mouth daily., Disp: , Rfl:  .  Cyanocobalamin (B-12) 5000 MCG CAPS, Take 5,000 mcg by mouth daily., Disp: , Rfl:  .  docusate sodium (COLACE) 100 MG capsule, Take 100 mg by mouth daily., Disp: , Rfl:  .  fluticasone (FLONASE) 50 MCG/ACT nasal spray, Place into the nose., Disp: , Rfl:  .  Multiple Vitamin (MULTIVITAMIN WITH MINERALS) TABS tablet, Take 1 tablet by mouth daily., Disp: , Rfl:  .  omeprazole (PRILOSEC) 40 MG capsule, Take 40 mg by mouth daily., Disp: , Rfl:  .  sennosides-docusate sodium (SENOKOT-S) 8.6-50 MG tablet, Take 2 tablets by mouth daily., Disp: 30 tablet, Rfl: 1 .  vitamin C (ASCORBIC ACID) 500 MG tablet, Take 500 mg by mouth daily., Disp: , Rfl:    Allergies  Allergen Reactions  . Codeine Nausea Only  . Levaquin [Levofloxacin In D5w] Diarrhea     Review of Systems  Constitutional: Negative for chills.  HENT: Negative for sore throat.   Gastrointestinal: Negative for abdominal pain.  Genitourinary: Positive  for dysuria, flank pain, frequency and hesitancy. Negative for urgency.  Neurological: Positive for headaches. Negative for tingling and numbness.     Today's Vitals   01/09/20 0915  BP: 122/70  Pulse: 64  Temp: 98.2 F (36.8 C)  TempSrc: Oral  Weight: 171 lb (77.6 kg)  Height: 5' 4.4" (1.636 m)  PainSc: 3   PainLoc: Back   Body mass index is 28.99 kg/m.   Objective:  Physical Exam Cardiovascular:     Pulses: Normal pulses.     Heart sounds: Normal heart sounds. No murmur.  Pulmonary:     Effort: Pulmonary effort is normal. No respiratory distress.      Breath sounds: Normal breath sounds.  Abdominal:     General: Abdomen is flat. Bowel sounds are normal.     Palpations: Abdomen is soft.     Tenderness: There is no abdominal tenderness. There is right CVA tenderness. There is no left CVA tenderness.  Skin:    Capillary Refill: Capillary refill takes less than 2 seconds.  Neurological:     General: No focal deficit present.     Mental Status: He is oriented to person, place, and time.  Psychiatric:        Mood and Affect: Mood normal.        Behavior: Behavior normal.        Thought Content: Thought content normal.        Judgment: Judgment normal.         Assessment And Plan:     1. Dysuria  Will refer to urologist due to the last time this occurred he had kidney stones and he has had 3 urinary tract infections since Dec 2019 - POCT Urinalysis Dipstick [id number]) - Ambulatory referral to Urology  2. Urinary tract infection without hematuria, site unspecified  Positive for nitrates  Will treat with rocephin 1 gram, has been treated with macrodantin twice   Urine sent for culture - Culture, Urine - Ambulatory referral to Urology - cefTRIAXone (ROCEPHIN) injection 1 g  3. Flank pain  Has positive CVA tenderness to right kidney more than left - Ambulatory referral to Urology  4. Urinary hesitancy  Will refer to urologist due to history of kidney stones - Ambulatory referral to Urology  5. Chronic nonintractable headache, unspecified headache type  Likely related to allergies  Will treat with nasonex nasal spray - mometasone (NASONEX) 50 MCG/ACT nasal spray; Place 2 sprays into the nose daily.  Dispense: 17 g; Refill: 2   Minette Brine, FNP    THE PATIENT IS ENCOURAGED TO PRACTICE SOCIAL DISTANCING DUE TO THE COVID-19 PANDEMIC.

## 2020-01-09 NOTE — Patient Instructions (Signed)
Urinary Tract Infection, Adult A urinary tract infection (UTI) is an infection of any part of the urinary tract. The urinary tract includes:  The kidneys.  The ureters.  The bladder.  The urethra. These organs make, store, and get rid of pee (urine) in the body. What are the causes? This is caused by germs (bacteria) in your genital area. These germs grow and cause swelling (inflammation) of your urinary tract. What increases the risk? You are more likely to develop this condition if:  You have a small, thin tube (catheter) to drain pee.  You cannot control when you pee or poop (incontinence).  You are male, and: ? You use these methods to prevent pregnancy:  A medicine that kills sperm (spermicide).  A device that blocks sperm (diaphragm). ? You have low levels of a male hormone (estrogen). ? You are pregnant.  You have genes that add to your risk.  You are sexually active.  You take antibiotic medicines.  You have trouble peeing because of: ? A prostate that is bigger than normal, if you are male. ? A blockage in the part of your body that drains pee from the bladder (urethra). ? A kidney stone. ? A nerve condition that affects your bladder (neurogenic bladder). ? Not getting enough to drink. ? Not peeing often enough.  You have other conditions, such as: ? Diabetes. ? A weak disease-fighting system (immune system). ? Sickle cell disease. ? Gout. ? Injury of the spine. What are the signs or symptoms? Symptoms of this condition include:  Needing to pee right away (urgently).  Peeing often.  Peeing small amounts often.  Pain or burning when peeing.  Blood in the pee.  Pee that smells bad or not like normal.  Trouble peeing.  Pee that is cloudy.  Fluid coming from the vagina, if you are male.  Pain in the belly or lower back. Other symptoms include:  Throwing up (vomiting).  No urge to eat.  Feeling mixed up (confused).  Being tired  and grouchy (irritable).  A fever.  Watery poop (diarrhea). How is this treated? This condition may be treated with:  Antibiotic medicine.  Other medicines.  Drinking enough water. Follow these instructions at home:  Medicines  Take over-the-counter and prescription medicines only as told by your doctor.  If you were prescribed an antibiotic medicine, take it as told by your doctor. Do not stop taking it even if you start to feel better. General instructions  Make sure you: ? Pee until your bladder is empty. ? Do not hold pee for a long time. ? Empty your bladder after sex. ? Wipe from front to back after pooping if you are a male. Use each tissue one time when you wipe.  Drink enough fluid to keep your pee pale yellow.  Keep all follow-up visits as told by your doctor. This is important. Contact a doctor if:  You do not get better after 1-2 days.  Your symptoms go away and then come back. Get help right away if:  You have very bad back pain.  You have very bad pain in your lower belly.  You have a fever.  You are sick to your stomach (nauseous).  You are throwing up. Summary  A urinary tract infection (UTI) is an infection of any part of the urinary tract.  This condition is caused by germs in your genital area.  There are many risk factors for a UTI. These include having a small, thin   tube to drain pee and not being able to control when you pee or poop.  Treatment includes antibiotic medicines for germs.  Drink enough fluid to keep your pee pale yellow. This information is not intended to replace advice given to you by your health care provider. Make sure you discuss any questions you have with your health care provider. Document Revised: 09/15/2018 Document Reviewed: 04/07/2018 Elsevier Patient Education  2020 Elsevier Inc.  

## 2020-01-11 LAB — URINE CULTURE

## 2020-01-15 ENCOUNTER — Telehealth: Payer: Self-pay

## 2020-01-15 NOTE — Telephone Encounter (Signed)
Okay thank you

## 2020-01-15 NOTE — Telephone Encounter (Signed)
The pt's mother Guerry Minors notified of the pt's most recent lab results.

## 2020-01-25 ENCOUNTER — Telehealth: Payer: Self-pay

## 2020-01-25 ENCOUNTER — Other Ambulatory Visit: Payer: Self-pay

## 2020-01-25 DIAGNOSIS — N390 Urinary tract infection, site not specified: Secondary | ICD-10-CM | POA: Diagnosis not present

## 2020-01-25 NOTE — Telephone Encounter (Signed)
I left pt vm to call the office we wanted to know if he has an appointment with the urologist. Joe [name]

## 2020-01-25 NOTE — Progress Notes (Signed)
ur

## 2020-01-27 LAB — URINE CULTURE

## 2020-01-29 ENCOUNTER — Other Ambulatory Visit: Payer: Self-pay | Admitting: Nurse Practitioner

## 2020-01-29 DIAGNOSIS — N390 Urinary tract infection, site not specified: Secondary | ICD-10-CM

## 2020-01-29 MED ORDER — SULFAMETHOXAZOLE-TRIMETHOPRIM 400-80 MG PO TABS: 1.0000 | ORAL_TABLET | Freq: Every day | ORAL | 0 refills | Status: AC

## 2020-02-06 DIAGNOSIS — K429 Umbilical hernia without obstruction or gangrene: Secondary | ICD-10-CM | POA: Diagnosis not present

## 2020-02-06 DIAGNOSIS — R3914 Feeling of incomplete bladder emptying: Secondary | ICD-10-CM | POA: Diagnosis not present

## 2020-02-06 DIAGNOSIS — N3020 Other chronic cystitis without hematuria: Secondary | ICD-10-CM | POA: Diagnosis not present

## 2020-02-06 DIAGNOSIS — N281 Cyst of kidney, acquired: Secondary | ICD-10-CM | POA: Diagnosis not present

## 2020-02-06 DIAGNOSIS — R1084 Generalized abdominal pain: Secondary | ICD-10-CM | POA: Diagnosis not present

## 2020-02-06 DIAGNOSIS — N200 Calculus of kidney: Secondary | ICD-10-CM | POA: Diagnosis not present

## 2020-03-12 DIAGNOSIS — N3020 Other chronic cystitis without hematuria: Secondary | ICD-10-CM | POA: Diagnosis not present

## 2020-03-12 DIAGNOSIS — R3914 Feeling of incomplete bladder emptying: Secondary | ICD-10-CM | POA: Diagnosis not present

## 2020-04-09 ENCOUNTER — Encounter: Payer: Medicare Other | Admitting: Nurse Practitioner

## 2020-04-24 DIAGNOSIS — R3914 Feeling of incomplete bladder emptying: Secondary | ICD-10-CM | POA: Diagnosis not present

## 2020-04-24 DIAGNOSIS — N200 Calculus of kidney: Secondary | ICD-10-CM | POA: Diagnosis not present

## 2020-04-24 DIAGNOSIS — N3020 Other chronic cystitis without hematuria: Secondary | ICD-10-CM | POA: Diagnosis not present

## 2020-06-10 DIAGNOSIS — H04123 Dry eye syndrome of bilateral lacrimal glands: Secondary | ICD-10-CM | POA: Diagnosis not present

## 2020-06-10 DIAGNOSIS — H10413 Chronic giant papillary conjunctivitis, bilateral: Secondary | ICD-10-CM | POA: Diagnosis not present

## 2020-06-10 DIAGNOSIS — H43811 Vitreous degeneration, right eye: Secondary | ICD-10-CM | POA: Diagnosis not present

## 2020-06-10 DIAGNOSIS — H2513 Age-related nuclear cataract, bilateral: Secondary | ICD-10-CM | POA: Diagnosis not present

## 2020-06-13 DIAGNOSIS — D229 Melanocytic nevi, unspecified: Secondary | ICD-10-CM | POA: Diagnosis not present

## 2020-06-13 DIAGNOSIS — D1801 Hemangioma of skin and subcutaneous tissue: Secondary | ICD-10-CM | POA: Diagnosis not present

## 2020-06-13 DIAGNOSIS — D235 Other benign neoplasm of skin of trunk: Secondary | ICD-10-CM | POA: Diagnosis not present

## 2020-06-13 DIAGNOSIS — L821 Other seborrheic keratosis: Secondary | ICD-10-CM | POA: Diagnosis not present

## 2020-06-13 DIAGNOSIS — Z85828 Personal history of other malignant neoplasm of skin: Secondary | ICD-10-CM | POA: Diagnosis not present

## 2020-06-14 DIAGNOSIS — Z87442 Personal history of urinary calculi: Secondary | ICD-10-CM | POA: Diagnosis not present

## 2020-06-14 DIAGNOSIS — R3914 Feeling of incomplete bladder emptying: Secondary | ICD-10-CM | POA: Diagnosis not present

## 2020-06-14 DIAGNOSIS — N3020 Other chronic cystitis without hematuria: Secondary | ICD-10-CM | POA: Diagnosis not present

## 2020-09-16 ENCOUNTER — Telehealth: Payer: Self-pay

## 2020-09-16 NOTE — Telephone Encounter (Signed)
Patient's mother called stating patient may have a UTI and she would like to bring him to the office to do a urine sample.  I returned her call on both numbers left v/m to call office so we can make appointment [facility]

## 2020-09-17 DIAGNOSIS — N200 Calculus of kidney: Secondary | ICD-10-CM | POA: Diagnosis not present

## 2020-09-17 DIAGNOSIS — R3914 Feeling of incomplete bladder emptying: Secondary | ICD-10-CM | POA: Diagnosis not present

## 2020-09-17 DIAGNOSIS — N3020 Other chronic cystitis without hematuria: Secondary | ICD-10-CM | POA: Diagnosis not present

## 2020-09-17 DIAGNOSIS — N281 Cyst of kidney, acquired: Secondary | ICD-10-CM | POA: Diagnosis not present

## 2020-09-18 ENCOUNTER — Telehealth: Payer: Self-pay | Admitting: Nurse Practitioner

## 2020-09-18 NOTE — Telephone Encounter (Signed)
Left message asking pt to call office.  Please reschedule 10/16/20 appointment Pamala Hurry will be out of office

## 2020-10-08 DIAGNOSIS — R1084 Generalized abdominal pain: Secondary | ICD-10-CM | POA: Diagnosis not present

## 2020-10-08 DIAGNOSIS — R3914 Feeling of incomplete bladder emptying: Secondary | ICD-10-CM | POA: Diagnosis not present

## 2020-10-08 DIAGNOSIS — N3020 Other chronic cystitis without hematuria: Secondary | ICD-10-CM | POA: Diagnosis not present

## 2020-10-16 ENCOUNTER — Encounter: Payer: Medicare Other | Admitting: Nurse Practitioner

## 2020-10-16 ENCOUNTER — Ambulatory Visit: Payer: Medicare Other | Admitting: Nurse Practitioner

## 2020-10-16 ENCOUNTER — Ambulatory Visit: Payer: Medicare Other

## 2020-10-24 ENCOUNTER — Ambulatory Visit: Payer: Medicare Other

## 2020-10-24 ENCOUNTER — Ambulatory Visit: Payer: Medicare Other | Admitting: Nurse Practitioner

## 2020-11-06 NOTE — Telephone Encounter (Signed)
Spoke with (mom) Guerry Minors to schedule AWV.  She wanted to let someone know.    She stated she never received a call from the office regarding pt having uti in dec..  She stated when she didn't hear anything from office she called [facility] and got an appointment.  Patient did have UTI.    I ready phone message to her stilled wanted to let someone know that she never received a call.

## 2020-12-23 DIAGNOSIS — R8271 Bacteriuria: Secondary | ICD-10-CM | POA: Diagnosis not present

## 2020-12-23 DIAGNOSIS — R3914 Feeling of incomplete bladder emptying: Secondary | ICD-10-CM | POA: Diagnosis not present

## 2021-01-30 ENCOUNTER — Encounter: Payer: Medicare Other | Admitting: Nurse Practitioner

## 2021-01-30 ENCOUNTER — Ambulatory Visit: Payer: Medicare Other

## 2021-02-12 DIAGNOSIS — M19012 Primary osteoarthritis, left shoulder: Secondary | ICD-10-CM | POA: Diagnosis not present

## 2021-02-12 DIAGNOSIS — M19011 Primary osteoarthritis, right shoulder: Secondary | ICD-10-CM | POA: Diagnosis not present

## 2021-02-12 DIAGNOSIS — M160 Bilateral primary osteoarthritis of hip: Secondary | ICD-10-CM | POA: Diagnosis not present

## 2021-03-03 DIAGNOSIS — E785 Hyperlipidemia, unspecified: Secondary | ICD-10-CM | POA: Diagnosis not present

## 2021-03-03 DIAGNOSIS — Z0000 Encounter for general adult medical examination without abnormal findings: Secondary | ICD-10-CM | POA: Diagnosis not present

## 2021-03-03 DIAGNOSIS — R7309 Other abnormal glucose: Secondary | ICD-10-CM | POA: Diagnosis not present

## 2021-03-03 DIAGNOSIS — G4700 Insomnia, unspecified: Secondary | ICD-10-CM | POA: Diagnosis not present

## 2021-03-03 DIAGNOSIS — R030 Elevated blood-pressure reading, without diagnosis of hypertension: Secondary | ICD-10-CM | POA: Diagnosis not present

## 2021-03-03 DIAGNOSIS — E559 Vitamin D deficiency, unspecified: Secondary | ICD-10-CM | POA: Diagnosis not present

## 2021-04-01 ENCOUNTER — Telehealth: Payer: Self-pay | Admitting: Nurse Practitioner

## 2021-04-01 NOTE — Telephone Encounter (Signed)
Left message for patient to call back and schedule Medicare Annual Wellness Visit (AWV) either virtually or in office.   Last AWV 10/11/19  please schedule at anytime with [facility]    This should be a 45 minute visit.

## 2021-04-23 ENCOUNTER — Telehealth: Payer: Self-pay | Admitting: Nurse Practitioner

## 2021-04-23 NOTE — Telephone Encounter (Signed)
Spoke with caregiver. She stated patient has changed PCP then hung up

## 2021-06-12 DIAGNOSIS — H43811 Vitreous degeneration, right eye: Secondary | ICD-10-CM | POA: Diagnosis not present

## 2021-06-12 DIAGNOSIS — H04123 Dry eye syndrome of bilateral lacrimal glands: Secondary | ICD-10-CM | POA: Diagnosis not present

## 2021-06-12 DIAGNOSIS — H10413 Chronic giant papillary conjunctivitis, bilateral: Secondary | ICD-10-CM | POA: Diagnosis not present

## 2021-06-12 DIAGNOSIS — H2513 Age-related nuclear cataract, bilateral: Secondary | ICD-10-CM | POA: Diagnosis not present

## 2021-06-19 DIAGNOSIS — L219 Seborrheic dermatitis, unspecified: Secondary | ICD-10-CM | POA: Diagnosis not present

## 2021-06-19 DIAGNOSIS — D1801 Hemangioma of skin and subcutaneous tissue: Secondary | ICD-10-CM | POA: Diagnosis not present

## 2021-06-19 DIAGNOSIS — L821 Other seborrheic keratosis: Secondary | ICD-10-CM | POA: Diagnosis not present

## 2021-06-19 DIAGNOSIS — C44519 Basal cell carcinoma of skin of other part of trunk: Secondary | ICD-10-CM | POA: Diagnosis not present

## 2021-06-19 DIAGNOSIS — L814 Other melanin hyperpigmentation: Secondary | ICD-10-CM | POA: Diagnosis not present

## 2021-06-23 DIAGNOSIS — R3914 Feeling of incomplete bladder emptying: Secondary | ICD-10-CM | POA: Diagnosis not present

## 2021-06-23 DIAGNOSIS — N3020 Other chronic cystitis without hematuria: Secondary | ICD-10-CM | POA: Diagnosis not present

## 2021-09-29 DIAGNOSIS — C44519 Basal cell carcinoma of skin of other part of trunk: Secondary | ICD-10-CM | POA: Diagnosis not present

## 2021-10-16 DIAGNOSIS — J309 Allergic rhinitis, unspecified: Secondary | ICD-10-CM | POA: Diagnosis not present

## 2021-10-16 DIAGNOSIS — M79604 Pain in right leg: Secondary | ICD-10-CM | POA: Diagnosis not present

## 2021-10-16 DIAGNOSIS — L299 Pruritus, unspecified: Secondary | ICD-10-CM | POA: Diagnosis not present

## 2021-12-10 DIAGNOSIS — R3914 Feeling of incomplete bladder emptying: Secondary | ICD-10-CM | POA: Diagnosis not present

## 2021-12-10 DIAGNOSIS — N3020 Other chronic cystitis without hematuria: Secondary | ICD-10-CM | POA: Diagnosis not present

## 2022-01-08 DIAGNOSIS — M545 Low back pain, unspecified: Secondary | ICD-10-CM | POA: Diagnosis not present

## 2022-01-08 DIAGNOSIS — R008 Other abnormalities of heart beat: Secondary | ICD-10-CM | POA: Diagnosis not present

## 2022-04-09 DIAGNOSIS — E785 Hyperlipidemia, unspecified: Secondary | ICD-10-CM | POA: Diagnosis not present

## 2022-04-09 DIAGNOSIS — E559 Vitamin D deficiency, unspecified: Secondary | ICD-10-CM | POA: Diagnosis not present

## 2022-04-09 DIAGNOSIS — R030 Elevated blood-pressure reading, without diagnosis of hypertension: Secondary | ICD-10-CM | POA: Diagnosis not present

## 2022-04-09 DIAGNOSIS — R7309 Other abnormal glucose: Secondary | ICD-10-CM | POA: Diagnosis not present

## 2022-04-20 DIAGNOSIS — R008 Other abnormalities of heart beat: Secondary | ICD-10-CM | POA: Diagnosis not present

## 2022-04-20 DIAGNOSIS — R109 Unspecified abdominal pain: Secondary | ICD-10-CM | POA: Diagnosis not present

## 2022-04-20 DIAGNOSIS — Z0001 Encounter for general adult medical examination with abnormal findings: Secondary | ICD-10-CM | POA: Diagnosis not present

## 2022-05-26 ENCOUNTER — Ambulatory Visit (INDEPENDENT_AMBULATORY_CARE_PROVIDER_SITE_OTHER): Payer: Medicare Other

## 2022-05-26 ENCOUNTER — Ambulatory Visit (HOSPITAL_BASED_OUTPATIENT_CLINIC_OR_DEPARTMENT_OTHER): Payer: Medicare Other | Admitting: Cardiovascular Disease

## 2022-05-26 ENCOUNTER — Encounter (HOSPITAL_BASED_OUTPATIENT_CLINIC_OR_DEPARTMENT_OTHER): Payer: Self-pay | Admitting: Cardiovascular Disease

## 2022-05-26 DIAGNOSIS — I447 Left bundle-branch block, unspecified: Secondary | ICD-10-CM

## 2022-05-26 DIAGNOSIS — I493 Ventricular premature depolarization: Secondary | ICD-10-CM | POA: Diagnosis not present

## 2022-05-26 DIAGNOSIS — R600 Localized edema: Secondary | ICD-10-CM | POA: Insufficient documentation

## 2022-05-26 DIAGNOSIS — R011 Cardiac murmur, unspecified: Secondary | ICD-10-CM

## 2022-05-26 HISTORY — DX: Ventricular premature depolarization: I49.3

## 2022-05-26 HISTORY — DX: Cardiac murmur, unspecified: R01.1

## 2022-05-26 HISTORY — DX: Localized edema: R60.0

## 2022-05-26 HISTORY — DX: Left bundle-branch block, unspecified: I44.7

## 2022-05-26 MED ORDER — FUROSEMIDE 20 MG PO TABS: 20.0000 mg | ORAL_TABLET | ORAL | 3 refills | Status: DC | PRN

## 2022-05-26 NOTE — Patient Instructions (Signed)
Medication Instructions:  START- Furosemide(Lasix) 20 mg by mouth as needed for swelling  *If you need a refill on your cardiac medications before your next appointment, please call your pharmacy*   Lab Work: None Ordered   Testing/Procedures: Your physician has requested that you have an echocardiogram. Echocardiography is a painless test that uses sound waves to create images of your heart. It provides your doctor with information about the size and shape of your heart and how well your heart's chambers and valves are working. This procedure takes approximately one hour. There are no restrictions for this procedure.  Your physician has recommended that you wear a holter monitor for 3 days. Holter monitors are medical devices that record the heart's electrical activity. Doctors most often use these monitors to diagnose arrhythmias. Arrhythmias are problems with the speed or rhythm of the heartbeat. The monitor is a small, portable device. You can wear one while you do your normal daily activities. This is usually used to diagnose what is causing palpitations/syncope (passing out).    Follow-Up: At [facility], you and your health needs are our priority.  As part of our continuing mission to provide you with exceptional heart care, we have created designated Provider Care Teams.  These Care Teams include your primary Cardiologist (physician) and Advanced Practice Providers (APPs -  Physician Assistants and Nurse Practitioners) who all work together to provide you with the care you need, when you need it.  We recommend signing up for the patient portal called "MyChart".  Sign up information is provided on this After Visit Summary.  MyChart is used to connect with patients for Virtual Visits (Telemedicine).  Patients are able to view lab/test results, encounter notes, upcoming appointments, etc.  Non-urgent messages can be sent to your provider as well.   To learn more about what you can do  with MyChart, go to [email or web address].    Your next appointment:   4-6 week(s)  The format for your next appointment:   In Person  Provider:   Laurann Montana, NP    Other Instructions 6 Months with Dr Dionicia Abler- Long Term Monitor Instructions  Your physician has requested you wear a ZIO patch monitor for 3 days.  This is a single patch monitor. [employer] supplies one patch monitor per enrollment. Additional stickers are not available. Please do not apply patch if you will be having a Nuclear Stress Test,  Echocardiogram, Cardiac CT, MRI, or Chest Xray during the period you would be wearing the  monitor. The patch cannot be worn during these tests. You cannot remove and re-apply the  ZIO XT patch monitor.  Your ZIO patch monitor will be mailed 3 day USPS to your address on file. It may take 3-5 days  to receive your monitor after you have been enrolled.  Once you have received your monitor, please review the enclosed instructions. Your monitor  has already been registered assigning a specific monitor serial # to you.  Billing and Patient Assistance Program Information  We have supplied [employer] with any of your insurance information on file for billing purposes. [employer] offers a sliding scale Patient Assistance Program for patients that do not have  insurance, or whose insurance does not completely cover the cost of the ZIO monitor.  You must apply for the Patient Assistance Program to qualify for this discounted rate.  To apply, please call [employer] at [phone], select option 4, select option 2, ask to apply for  Patient Assistance Program. Theodore Demark  will ask your household income, and how many people  are in your household. They will quote your out-of-pocket cost based on that information.  [employer] will also be able to set up a 12-month interest-free payment plan if needed.  Applying the monitor   Shave hair from upper left chest.  Hold abrader disc by orange  tab. Rub abrader in 40 strokes over the upper left chest as  indicated in your monitor instructions.  Clean area with 4 enclosed alcohol pads. Let dry.  Apply patch as indicated in monitor instructions. Patch will be placed under collarbone on left  side of chest with arrow pointing upward.  Rub patch adhesive wings for 2 minutes. Remove white label marked "1". Remove the white  label marked "2". Rub patch adhesive wings for 2 additional minutes.  While looking in a mirror, press and release button in center of patch. A small green light will  flash 3-4 times. This will be your only indicator that the monitor has been turned on.  Do not shower for the first 24 hours. You may shower after the first 24 hours.  Press the button if you feel a symptom. You will hear a small click. Record Date, Time and  Symptom in the Patient Logbook.  When you are ready to remove the patch, follow instructions on the last 2 pages of Patient  Logbook. Stick patch monitor onto the last page of Patient Logbook.  Place Patient Logbook in the blue and white box. Use locking tab on box and tape box closed  securely. The blue and white box has prepaid postage on it. Please place it in the mailbox as  soon as possible. Your physician should have your test results approximately 7 days after the  monitor has been [facility]  Call [employer] [phone] you have questions regarding  your ZIO XT patch monitor. Call them immediately if you see an orange light blinking on your  monitor.  If your monitor falls off in less than 4 days, contact our Monitor department at [phone]  If your monitor becomes loose or falls off after 4 days call [employer] at [phone]  suggestions on securing your monitor

## 2022-05-26 NOTE — Assessment & Plan Note (Signed)
Diastolic murmur noted lower extremity edema as above.  We will get an echocardiogram and also give him Lasix 20 mg to take as needed.

## 2022-05-26 NOTE — Progress Notes (Signed)
Not waking up like Cardiology Office Note:    Date:  05/26/2022   ID:  Joe [name], DOB 1956/06/22, MRN [id number]  PCP:  Willey Blade, MD   [place] Providers Cardiologist:  None     Referring MD: Willey Blade, MD   No chief complaint on file.   History of Present Illness:    Joe [name] is a 66 y.o. male with a hx of anxiety and mild cognitive impairment here for evaluation of abnormal EKG. He saw Dr. Karlton Lemon 04/2022. He had an EKG that revealed sinus arrhythmia, LBBB, and a first degree heart block.  Today, he is accompanied by his mother, who also provides the history. He states he is generally feeling okay, aside from "a little bit" of chest pain. This is randomly occurring, with no known triggers. For exercise he rides his bicycle every Saturday, usually 5-6 miles in [facility], and then back home. He feels good with exercise, without anginal symptoms. Also he works out at [employer] sometimes. Also he complains of swelling in his bilateral ankles, with initial onset in the past few months. They attribute this to having too much sodium in one day. They usually cook his meals at home, and he admits to having too much salt in his diet (usually adding salt). He has never been a smoker. He may drink one beer rarely. He endorses some snoring. In the mornings he is usually still a little tired, but not much. During the day he may fall asleep easily such as when sitting and watching TV. He denies any palpitations, or shortness of breath. No lightheadedness, headaches, syncope, orthopnea, or PND.   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   Murmur 05/26/2022   Osteoarthritis of left hip 01/16/2014   Osteoarthritis of right hip 05/15/2014   Primary localized osteoarthrosis of left shoulder 09/21/2017   PVC (premature ventricular contraction) 05/26/2022   Right ureteral stone    Speech impediment    Umbilical hernia     Past Surgical History:  Procedure Laterality Date   BACK SURGERY     CYSTOSCOPY W/ URETERAL STENT REMOVAL Right 04/19/2013   Procedure: CYSTOSCOPY WITH STENT REMOVAL;  Surgeon: Molli Hazard, MD;  Location: [facility];  Service: Urology;  Laterality: Right;   CYSTOSCOPY WITH RETROGRADE PYELOGRAM, URETEROSCOPY AND STENT PLACEMENT Right 04/19/2013   Procedure: CYSTOSCOPY WITH RETROGRADE PYELOGRAM, URETEROSCOPY ;  Surgeon: Molli Hazard, MD;  Location: [facility];  Service: Urology;  Laterality: Right;   CYSTOSCOPY WITH STENT PLACEMENT Right 03/24/2013   Procedure: CYSTOSCOPY WITH STENT PLACEMENT;  Surgeon: Hanley Ben, MD;  Location: [facility];  Service: Urology;  Laterality: Right;   HOLMIUM LASER APPLICATION Right 06/18/4162   Procedure: HOLMIUM LASER APPLICATION;  Surgeon: Molli Hazard, MD;  Location[facility];  Service: Urology;  Laterality: Right;   INGUINAL HERNIA REPAIR Left 03-04-2005   POSTERIOR LUMBAR FUSION  08-27-2011   L4 -- L5   RIGHT SHOULDER HEMIARTHROPLASTY  08-26-2010   OA  SHOULDER HEMI-ARTHROPLASTY Left 09/21/2017   Procedure: SHOULDER HEMI-ARTHROPLASTY;  Surgeon: Marchia Bond, MD;  Location: [place];  Service: Orthopedics;  Laterality: Left;   TONSILLECTOMY  AS CHILD   TOTAL HIP ARTHROPLASTY Left 01/16/2014   Procedure: TOTAL HIP ARTHROPLASTY;  Surgeon: Johnny Bridge, MD;  Location: [place];  Service: Orthopedics;  Laterality: Left;   TOTAL HIP ARTHROPLASTY Right 05/15/2014   Procedure: RIGHT TOTAL HIP ARTHROPLASTY;  Surgeon: Johnny Bridge, MD;  Location: [place];  Service: Orthopedics;  Laterality: Right;    TOTAL SHOULDER ARTHROPLASTY Right 2011    Current Medications: Current Meds  Medication Sig   CALCIUM-VITAMIN D PO Take 2 tablets by mouth daily.   Cyanocobalamin (B-12) 5000 MCG CAPS Take 5,000 mcg by mouth daily.   fluticasone (FLONASE) 50 MCG/ACT nasal spray Place 2 sprays into both nostrils daily.   furosemide (LASIX) 20 MG tablet Take 1 tablet (20 mg total) by mouth as needed for edema.   L-Theanine 200 MG CAPS Take 200 mg by mouth at bedtime.   Multiple Vitamin (MULTIVITAMIN WITH MINERALS) TABS tablet Take 1 tablet by mouth daily.   tamsulosin (FLOMAX) 0.4 MG CAPS capsule Take 0.4 mg by mouth 2 (two) times daily.   UNABLE TO FIND daily. Med Name: Neuro optimizer   UNABLE TO FIND Med Name: Neuro Zac: Take 2 in AM and 1 in PM   vitamin C (ASCORBIC ACID) 500 MG tablet Take 500 mg by mouth daily.     Allergies:   Codeine and Levaquin [levofloxacin in d5w]   Social History   Socioeconomic History   Marital status: Single    Spouse name: Not on file   Number of children: Not on file   Years of education: Not on file   Highest education level: Not on file  Occupational History   Occupation: unemployed  Tobacco Use   Smoking status: Never   Smokeless tobacco: Never  Vaping Use   Vaping Use: Never used  Substance and Sexual Activity   Alcohol use: No   Drug use: No   Sexual activity: Not Currently  Other Topics Concern   Not on file  Social History Narrative   Pt with delayed mental capabilities, 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  Lives with parents.   Social Determinants of Health   Financial Resource Strain: Low Risk  (10/11/2019)   Overall Financial Resource Strain (CARDIA)    Difficulty of Paying Living Expenses: Not hard at all  Food Insecurity: No Food Insecurity (10/11/2019)   Hunger Vital Sign    Worried About Running Out of Food in the Last Year: Never true     Ran Out of Food in the Last Year: Never true  Transportation Needs: No Transportation Needs (10/11/2019)   PRAPARE - Hydrologist (Medical): No    Lack of Transportation (Non-Medical): No  Physical Activity: Sufficiently Active (10/11/2019)   Exercise Vital Sign    Days of Exercise per Week: 5 days    Minutes of Exercise per Session: 40 min  Stress: Stress Concern Present (10/11/2019)   [place]    Feeling of Stress : To some extent  Social Connections: Not on file     Family History: The patient's family history includes Atrial fibrillation in his maternal grandmother and mother; Heart failure in his  maternal grandmother and mother.  ROS:   Please see the history of present illness.    (+) Chest pain (+) Bilateral ankle edema (+) Snoring (+) Daytime somnolence All other systems reviewed and are negative.  EKGs/Labs/Other Studies Reviewed:    The following studies were reviewed today:  No prior cardiovascular studies available.   EKG:  EKG is personally reviewed. 05/26/2022: Sinus bradycardia. Rate 57 bpm. LBBB. First degree AV block.  Recent Labs: No results found for requested labs within last 365 days.   Recent Lipid Panel    Component Value Date/Time   CHOL 203 (H) 10/11/2019 1521   TRIG 79 10/11/2019 1521   HDL 80 10/11/2019 1521   CHOLHDL 2.5 10/11/2019 1521   LDLCALC 109 (H) 10/11/2019 1521        Physical Exam:    Wt Readings from Last 3 Encounters:  05/26/22 171 lb (77.6 kg)  01/09/20 171 lb (77.6 kg)  10/11/19 171 lb (77.6 kg)     VS:  BP 122/68   Pulse (!) 57   Ht '5\' 4"'$  (1.626 m)   Wt 171 lb (77.6 kg)   BMI 29.35 kg/m  , BMI Body mass index is 29.35 kg/m. GENERAL:  Well appearing HEENT: Pupils equal round and reactive, fundi not visualized, oral mucosa unremarkable NECK:  No jugular venous distention, waveform within normal limits, carotid  upstroke brisk and symmetric, no bruits, no thyromegaly LUNGS:  Clear to auscultation bilaterally HEART:  RRR.  PMI not displaced or sustained,S1 and S2 within normal limits, no S3, no S4, no clicks, no rubs, 2/4 diastolic murmur at the LUSB ABD:  Flat, positive bowel sounds normal in frequency in pitch, no bruits, no rebound, no guarding, no midline pulsatile mass, no hepatomegaly, no splenomegaly EXT:  2 plus pulses throughout, 1+ LE edema to the ankles R>L, no cyanosis no clubbing SKIN:  No rashes no nodules NEURO:  Cranial nerves II through XII grossly intact, motor grossly intact throughout PSYCH:  Cognitively intact, oriented to person place and time   ASSESSMENT:    1. PVC (premature ventricular contraction)   2. Lower extremity edema   3. LBBB (left bundle branch block)   4. Murmur    PLAN:    PVC (premature ventricular contraction) Frequent PACs.  He also has heart failure symptoms with lower extremity edema and orthopnea.  Labs were unremarkable with his PCP recently.  We will get a 3-day ZIO monitor to quantify his arrhythmia burden.  He  Lower extremity edema This is new and the last several weeks.  He still has good exertional tolerance but does report some occasional orthopnea.  We will get an echo to assess.  LBBB (left bundle branch block) Left lower branch block noted on the EKG today with his PCP.  He rides his bike for over 5 miles and works out at [employer] regularly with no chest pain.  I suspect that this is not related to ischemia.  We are getting an echo as above.  Murmur Diastolic murmur noted lower extremity edema as above.  We will get an echocardiogram and also give him Lasix 20 mg to take as needed.  Disposition: FU with APP in 4-6 weeks. FU with Elwyn Klosinski C. Oval Linsey, MD, [facility] in 6 months.  Medication Adjustments/Labs and Tests Ordered: Current medicines are reviewed at length with the patient today.  Concerns regarding medicines are outlined above.    Orders Placed This Encounter  Procedures   LONG TERM MONITOR (3-14 DAYS)  EKG 12-Lead   ECHOCARDIOGRAM COMPLETE   Meds ordered this encounter  Medications   furosemide (LASIX) 20 MG tablet    Sig: Take 1 tablet (20 mg total) by mouth as needed for edema.    Dispense:  90 tablet    Refill:  3   Patient Instructions  Medication Instructions:  START- Furosemide(Lasix) 20 mg by mouth as needed for swelling  *If you need a refill on your cardiac medications before your next appointment, please call your pharmacy*   Lab Work: None Ordered   Testing/Procedures: Your physician has requested that you have an echocardiogram. Echocardiography is a painless test that uses sound waves to create images of your heart. It provides your doctor with information about the size and shape of your heart and how well your heart's chambers and valves are working. This procedure takes approximately one hour. There are no restrictions for this procedure.  Your physician has recommended that you wear a holter monitor for 3 days. Holter monitors are medical devices that record the heart's electrical activity. Doctors most often use these monitors to diagnose arrhythmias. Arrhythmias are problems with the speed or rhythm of the heartbeat. The monitor is a small, portable device. You can wear one while you do your normal daily activities. This is usually used to diagnose what is causing palpitations/syncope (passing out).    Follow-Up: At [facility], you and your health needs are our priority.  As part of our continuing mission to provide you with exceptional heart care, we have created designated Provider Care Teams.  These Care Teams include your primary Cardiologist (physician) and Advanced Practice Providers (APPs -  Physician Assistants and Nurse Practitioners) who all work together to provide you with the care you need, when you need it.  We recommend signing up for the patient portal called  "MyChart".  Sign up information is provided on this After Visit Summary.  MyChart is used to connect with patients for Virtual Visits (Telemedicine).  Patients are able to view lab/test results, encounter notes, upcoming appointments, etc.  Non-urgent messages can be sent to your provider as well.   To learn more about what you can do with MyChart, go to [email or web address].    Your next appointment:   4-6 week(s)  The format for your next appointment:   In Person  Provider:   Laurann Montana, NP    Other Instructions 6 Months with Dr Dionicia Abler- Long Term Monitor Instructions  Your physician has requested you wear a ZIO patch monitor for 3 days.  This is a single patch monitor. [employer] supplies one patch monitor per enrollment. Additional stickers are not available. Please do not apply patch if you will be having a Nuclear Stress Test,  Echocardiogram, Cardiac CT, MRI, or Chest Xray during the period you would be wearing the  monitor. The patch cannot be worn during these tests. You cannot remove and re-apply the  ZIO XT patch monitor.  Your ZIO patch monitor will be mailed 3 day USPS to your address on file. It may take 3-5 days  to receive your monitor after you have been enrolled.  Once you have received your monitor, please review the enclosed instructions. Your monitor  has already been registered assigning a specific monitor serial # to you.  Billing and Patient Assistance Program Information  We have supplied [employer] with any of your insurance information on file for billing purposes. [employer] offers a sliding scale Patient Assistance Program for patients that do  not have  insurance, or whose insurance does not completely cover the cost of the ZIO monitor.  You must apply for the Patient Assistance Program to qualify for this discounted rate.  To apply, please call [employer] at [phone], select option 4, select option 2, ask to apply for  Patient Assistance  Program. Theodore Demark will ask your household income, and how many people  are in your household. They will quote your out-of-pocket cost based on that information.  [employer] will also be able to set up a 75-month interest-free payment plan if needed.  Applying the monitor   Shave hair from upper left chest.  Hold abrader disc by orange tab. Rub abrader in 40 strokes over the upper left chest as  indicated in your monitor instructions.  Clean area with 4 enclosed alcohol pads. Let dry.  Apply patch as indicated in monitor instructions. Patch will be placed under collarbone on left  side of chest with arrow pointing upward.  Rub patch adhesive wings for 2 minutes. Remove white label marked "1". Remove the white  label marked "2". Rub patch adhesive wings for 2 additional minutes.  While looking in a mirror, press and release button in center of patch. A small green light will  flash 3-4 times. This will be your only indicator that the monitor has been turned on.  Do not shower for the first 24 hours. You may shower after the first 24 hours.  Press the button if you feel a symptom. You will hear a small click. Record Date, Time and  Symptom in the Patient Logbook.  When you are ready to remove the patch, follow instructions on the last 2 pages of Patient  Logbook. Stick patch monitor onto the last page of Patient Logbook.  Place Patient Logbook in the blue and white box. Use locking tab on box and tape box closed  securely. The blue and white box has prepaid postage on it. Please place it in the mailbox as  soon as possible. Your physician should have your test results approximately 7 days after the  monitor has [facility]  Call I[place]at [phone] you have questions regarding  your ZIO XT patch monitor. Call them immediately if you see an orange light blinking on your  monitor.  If your monitor falls off in less than 4 days, contact our  Monitor department at [phone]  If your monitor becomes loose or falls off after 4 days call [employer] at [phone]  suggestions on securing your monitor          I,Mathew Stumpf,acting as a scribe for TSkeet Latch MD.,have documented all relevant documentation on the behalf of TSkeet Latch MD,as directed by  TSkeet Latch MD while in the presence of TSkeet Latch MD.  I, TJeneraROval Linsey MD have reviewed all documentation for this visit.  The documentation of the exam, diagnosis, procedures, and orders on 05/26/2022 are all accurate and complete.   Signed, TSkeet Latch MD  05/26/2022 3:54 PM    [place]

## 2022-05-26 NOTE — Assessment & Plan Note (Signed)
Left lower branch block noted on the EKG today with his PCP.  He rides his bike for over 5 miles and works out at [employer] regularly with no chest pain.  I suspect that this is not related to ischemia.  We are getting an echo as above.

## 2022-05-26 NOTE — Assessment & Plan Note (Signed)
This is new and the last several weeks.  He still has good exertional tolerance but does report some occasional orthopnea.  We will get an echo to assess.

## 2022-05-26 NOTE — Assessment & Plan Note (Signed)
Frequent PACs.  He also has heart failure symptoms with lower extremity edema and orthopnea.  Labs were unremarkable with his PCP recently.  We will get a 3-day ZIO monitor to quantify his arrhythmia burden.  He

## 2022-06-04 ENCOUNTER — Other Ambulatory Visit (HOSPITAL_BASED_OUTPATIENT_CLINIC_OR_DEPARTMENT_OTHER): Payer: Medicare Other

## 2022-06-04 DIAGNOSIS — I493 Ventricular premature depolarization: Secondary | ICD-10-CM | POA: Diagnosis not present

## 2022-06-04 DIAGNOSIS — R011 Cardiac murmur, unspecified: Secondary | ICD-10-CM | POA: Diagnosis not present

## 2022-06-10 DIAGNOSIS — N3020 Other chronic cystitis without hematuria: Secondary | ICD-10-CM | POA: Diagnosis not present

## 2022-06-10 DIAGNOSIS — R3914 Feeling of incomplete bladder emptying: Secondary | ICD-10-CM | POA: Diagnosis not present

## 2022-06-25 ENCOUNTER — Other Ambulatory Visit: Payer: Self-pay

## 2022-06-25 ENCOUNTER — Encounter (HOSPITAL_COMMUNITY): Payer: Self-pay

## 2022-06-25 ENCOUNTER — Inpatient Hospital Stay (HOSPITAL_COMMUNITY)
Admission: RE | Admit: 2022-06-25 | Discharge: 2022-06-30 | DRG: 286 | Disposition: A | Discharge status: Home / Self Care | Payer: Medicare Other | Attending: Internal Medicine | Admitting: Internal Medicine

## 2022-06-25 ENCOUNTER — Other Ambulatory Visit: Payer: Self-pay | Admitting: Home Health

## 2022-06-25 ENCOUNTER — Ambulatory Visit (INDEPENDENT_AMBULATORY_CARE_PROVIDER_SITE_OTHER): Payer: Medicare Other

## 2022-06-25 DIAGNOSIS — Z87442 Personal history of urinary calculi: Secondary | ICD-10-CM | POA: Diagnosis not present

## 2022-06-25 DIAGNOSIS — Z8249 Family history of ischemic heart disease and other diseases of the circulatory system: Secondary | ICD-10-CM | POA: Diagnosis not present

## 2022-06-25 DIAGNOSIS — G939 Disorder of brain, unspecified: Secondary | ICD-10-CM | POA: Diagnosis not present

## 2022-06-25 DIAGNOSIS — F419 Anxiety disorder, unspecified: Secondary | ICD-10-CM | POA: Diagnosis present

## 2022-06-25 DIAGNOSIS — R600 Localized edema: Secondary | ICD-10-CM

## 2022-06-25 DIAGNOSIS — M19012 Primary osteoarthritis, left shoulder: Secondary | ICD-10-CM | POA: Diagnosis present

## 2022-06-25 DIAGNOSIS — N400 Enlarged prostate without lower urinary tract symptoms: Secondary | ICD-10-CM | POA: Diagnosis present

## 2022-06-25 DIAGNOSIS — G3184 Mild cognitive impairment, so stated: Secondary | ICD-10-CM | POA: Diagnosis present

## 2022-06-25 DIAGNOSIS — I5042 Chronic combined systolic (congestive) and diastolic (congestive) heart failure: Secondary | ICD-10-CM | POA: Diagnosis not present

## 2022-06-25 DIAGNOSIS — Z79899 Other long term (current) drug therapy: Secondary | ICD-10-CM

## 2022-06-25 DIAGNOSIS — Z96611 Presence of right artificial shoulder joint: Secondary | ICD-10-CM | POA: Diagnosis present

## 2022-06-25 DIAGNOSIS — I493 Ventricular premature depolarization: Secondary | ICD-10-CM

## 2022-06-25 DIAGNOSIS — R008 Other abnormalities of heart beat: Secondary | ICD-10-CM | POA: Diagnosis present

## 2022-06-25 DIAGNOSIS — I471 Supraventricular tachycardia: Secondary | ICD-10-CM | POA: Diagnosis present

## 2022-06-25 DIAGNOSIS — I5041 Acute combined systolic (congestive) and diastolic (congestive) heart failure: Secondary | ICD-10-CM | POA: Diagnosis not present

## 2022-06-25 DIAGNOSIS — Z881 Allergy status to other antibiotic agents status: Secondary | ICD-10-CM

## 2022-06-25 DIAGNOSIS — I2510 Atherosclerotic heart disease of native coronary artery without angina pectoris: Secondary | ICD-10-CM | POA: Diagnosis present

## 2022-06-25 DIAGNOSIS — I712 Thoracic aortic aneurysm, without rupture, unspecified: Secondary | ICD-10-CM | POA: Diagnosis not present

## 2022-06-25 DIAGNOSIS — F79 Unspecified intellectual disabilities: Secondary | ICD-10-CM | POA: Diagnosis present

## 2022-06-25 DIAGNOSIS — I447 Left bundle-branch block, unspecified: Secondary | ICD-10-CM

## 2022-06-25 DIAGNOSIS — Z96643 Presence of artificial hip joint, bilateral: Secondary | ICD-10-CM | POA: Diagnosis not present

## 2022-06-25 DIAGNOSIS — I351 Nonrheumatic aortic (valve) insufficiency: Secondary | ICD-10-CM | POA: Diagnosis not present

## 2022-06-25 DIAGNOSIS — F801 Expressive language disorder: Secondary | ICD-10-CM | POA: Diagnosis present

## 2022-06-25 DIAGNOSIS — I5021 Acute systolic (congestive) heart failure: Secondary | ICD-10-CM | POA: Diagnosis not present

## 2022-06-25 DIAGNOSIS — M160 Bilateral primary osteoarthritis of hip: Secondary | ICD-10-CM | POA: Diagnosis present

## 2022-06-25 DIAGNOSIS — I7121 Aneurysm of the ascending aorta, without rupture: Secondary | ICD-10-CM | POA: Diagnosis not present

## 2022-06-25 DIAGNOSIS — I472 Ventricular tachycardia, unspecified: Secondary | ICD-10-CM | POA: Diagnosis present

## 2022-06-25 DIAGNOSIS — Z885 Allergy status to narcotic agent status: Secondary | ICD-10-CM

## 2022-06-25 DIAGNOSIS — I440 Atrioventricular block, first degree: Secondary | ICD-10-CM | POA: Diagnosis not present

## 2022-06-25 DIAGNOSIS — I25119 Atherosclerotic heart disease of native coronary artery with unspecified angina pectoris: Secondary | ICD-10-CM | POA: Diagnosis not present

## 2022-06-25 DIAGNOSIS — I5020 Unspecified systolic (congestive) heart failure: Secondary | ICD-10-CM | POA: Diagnosis not present

## 2022-06-25 DIAGNOSIS — R079 Chest pain, unspecified: Secondary | ICD-10-CM | POA: Diagnosis present

## 2022-06-25 DIAGNOSIS — I429 Cardiomyopathy, unspecified: Secondary | ICD-10-CM | POA: Diagnosis not present

## 2022-06-25 DIAGNOSIS — I719 Aortic aneurysm of unspecified site, without rupture: Principal | ICD-10-CM | POA: Diagnosis present

## 2022-06-25 LAB — CBC WITH DIFFERENTIAL/PLATELET
Abs Immature Granulocytes: 0.01 10*3/uL (ref 0.00–0.07)
Basophils Absolute: 0.1 10*3/uL (ref 0.0–0.1)
Basophils Relative: 1 %
Eosinophils Absolute: 0.1 10*3/uL (ref 0.0–0.5)
Eosinophils Relative: 1 %
HCT: 41.4 % (ref 39.0–52.0)
Hemoglobin: 14.9 g/dL (ref 13.0–17.0)
Immature Granulocytes: 0 %
Lymphocytes Relative: 26 %
Lymphs Abs: 1.9 10*3/uL (ref 0.7–4.0)
MCH: 36.2 pg — ABNORMAL HIGH (ref 26.0–34.0)
MCHC: 36 g/dL (ref 30.0–36.0)
MCV: 100.5 fL — ABNORMAL HIGH (ref 80.0–100.0)
Monocytes Absolute: 0.5 10*3/uL (ref 0.1–1.0)
Monocytes Relative: 7 %
Neutro Abs: 4.6 10*3/uL (ref 1.7–7.7)
Neutrophils Relative %: 65 %
Platelets: 218 10*3/uL (ref 150–400)
RBC: 4.12 MIL/uL — ABNORMAL LOW (ref 4.22–5.81)
RDW: 12.7 % (ref 11.5–15.5)
WBC: 7.2 10*3/uL (ref 4.0–10.5)
nRBC: 0 % (ref 0.0–0.2)

## 2022-06-25 LAB — ECHOCARDIOGRAM COMPLETE
Area-P 1/2: 2.69 cm2
Calc EF: 30.3 %
MV M vel: 3.87 m/s
MV Peak grad: 59.9 mmHg
P 1/2 time: 572 msec
S' Lateral: 6.48 cm
Single Plane A2C EF: 32.8 %
Single Plane A4C EF: 30.4 %

## 2022-06-25 LAB — COMPREHENSIVE METABOLIC PANEL
ALT: 21 U/L (ref 0–44)
AST: 21 U/L (ref 15–41)
Albumin: 4 g/dL (ref 3.5–5.0)
Alkaline Phosphatase: 64 U/L (ref 38–126)
Anion gap: 9 (ref 5–15)
BUN: 15 mg/dL (ref 8–23)
CO2: 24 mmol/L (ref 22–32)
Calcium: 9.7 mg/dL (ref 8.9–10.3)
Chloride: 106 mmol/L (ref 98–111)
Creatinine, Ser: 0.83 mg/dL (ref 0.61–1.24)
GFR, Estimated: >60 mL/min (ref >60)
Glucose, Bld: 96 mg/dL (ref 70–99)
Potassium: 4.3 mmol/L (ref 3.5–5.1)
Sodium: 139 mmol/L (ref 135–145)
Total Bilirubin: 1.2 mg/dL (ref 0.3–1.2)
Total Protein: 6.6 g/dL (ref 6.5–8.1)

## 2022-06-25 LAB — BRAIN NATRIURETIC PEPTIDE: B Natriuretic Peptide: 436.2 pg/mL — ABNORMAL HIGH (ref 0.0–100.0)

## 2022-06-25 LAB — PROTIME-INR
INR: 1.1 (ref 0.8–1.2)
Prothrombin Time: 13.9 seconds (ref 11.4–15.2)

## 2022-06-25 LAB — MAGNESIUM: Magnesium: 2 mg/dL (ref 1.7–2.4)

## 2022-06-25 MED ORDER — FUROSEMIDE 20 MG PO TABS: 20.0000 mg | ORAL_TABLET | ORAL | Status: DC | PRN

## 2022-06-25 MED ORDER — FLUTICASONE PROPIONATE 50 MCG/ACT NA SUSP
2.0000 | Freq: Every day | NASAL | Status: DC
  Administered 2022-06-26 – 2022-06-30 (×5): 2 via NASAL
  Filled 2022-06-25: qty 16

## 2022-06-25 MED ORDER — TAMSULOSIN HCL 0.4 MG PO CAPS
0.4000 mg | ORAL_CAPSULE | Freq: Two times a day (BID) | ORAL | Status: DC
  Administered 2022-06-25 – 2022-06-30 (×10): 0.4 mg via ORAL
  Filled 2022-06-25 (×10): qty 1

## 2022-06-25 MED ORDER — ALUM & MAG HYDROXIDE-SIMETH 200-200-20 MG/5ML PO SUSP
30.0000 mL | ORAL | Status: DC | PRN
  Administered 2022-06-25: 30 mL via ORAL
  Filled 2022-06-25 (×2): qty 30

## 2022-06-25 MED ORDER — ACETAMINOPHEN 500 MG PO TABS
1000.0000 mg | ORAL_TABLET | Freq: Four times a day (QID) | ORAL | Status: DC | PRN
  Administered 2022-06-25 – 2022-06-29 (×7): 1000 mg via ORAL
  Filled 2022-06-25 (×7): qty 2

## 2022-06-25 MED ORDER — HEPARIN SODIUM (PORCINE) 5000 UNIT/ML IJ SOLN
5000.0000 [IU] | Freq: Three times a day (TID) | INTRAMUSCULAR | Status: DC
  Administered 2022-06-25 – 2022-06-29 (×11): 5000 [IU] via SUBCUTANEOUS
  Filled 2022-06-25 (×11): qty 1

## 2022-06-25 NOTE — Progress Notes (Unsigned)
Spoke with Dr.Christopher regarding patient's EF, Aortic valve and no prior cardiac history. Patient was symptomatic at time of test and has follow up with Laurann Montana, PA. Patient okay to leave.   Leavy Cella, RDCS, RVT

## 2022-06-25 NOTE — Progress Notes (Signed)
Pt arrived to 4e as direct admit. A&Ox3. Pain 5/10 anterior head. Skin c/d/I. No current c/o chest pain at this time. Pt oriented to unit and call light in reach.  Raelyn Number, RN

## 2022-06-25 NOTE — H&P (Cosign Needed Addendum)
Cardiology Admission History and Physical   Patient ID: [name] MRN: [id number]; DOB: 06-04-1956   Admission date: 06/25/2022  PCP:  [name], [facility] Cardiologist:  Joe Latch, MD   {   Chief Complaint:  chest pain  Patient Profile:   [name] is a 66 y.o. male with with PMH of  anxiety, mild cognitive impairment, LBBB, first degree AVB, who is being seen 06/25/2022 for the evaluation of aortic aneurysm.  History of Present Illness:   [name] was recently established as a new patient with [name] on 05/26/2022, was referred for evaluation of abnormal EKG completed 04/2022 revealing sinus arrhythmia, LBBB, first-degree AV block.  He had complained chest pain with random onset and no specific trigger.  He has no angina symptoms with exercising such as riding bikes >5 miles.  He complained of bilateral ankle swelling over the past few month, felt this was due to taking too much salt in his diet.  EKG completed that day revealed sinus bradycardia with ventricular rate 57 bpm, LBBB, first-degree AV block.  He was noted with frequent PVCs as well as a diastolic murmur.  He was concerned for CHF given lower extremity edema and orthopnea.  He was recommended 3-day ZIO monitor to quantify PVC burden.  He was arranged an echocardiogram.  Given his adequate tolerance to exercise, LBBB was felt not related to ischemia.  He was placed on as needed Lasix 20 mg daily.  3-day ZIO monitor since 06/09/2022 had revealed minimal heart rate 38 bpm, maximal heart rate 158 bpm, average heart rate 60 bpm, predominant underlying rhythm was sinus rhythm.  First-degree AV block was present.  Bundle branch block/IVCD was present.  1 run of ventricular tachycardia occurred lasting 5 beats with a maximal heart rate of 136 bpm, 21 SVT runs occurred, the run with the fastest interval lasting 6 beats with maximal heart rate of 158 bpm, the longest lasting  16.1 seconds with an average rate of 115 bpm.  Junctional rhythm was present.  Isolated SVE's were occasional (3.1%), SVE couplets were rare (<1%), SVE triplets were rare (<1%), isolated VE's were occasional (3.7%), VE couplets were rare (<1%), no VE triplets were present.  Ventricular bigeminy and trigeminy were present.  Echocardiogram from today revealed LVEF 25 to 30%, LV global hypokinesis, severely dilated LV internal cavity, moderate concentric LVH, grade 1 DD, no LV thrombus, normal RV normal PASP, severe LAE, mild RAE, mild MR, severe aortic regurgitation, There is diatsolic flow reversal (aortic arch asssessment). Eccentric jet  related to root and Sinus of Valsalva dialtiont. Vena contracta 0.8 mm.  Moderate pulmonic regurgitation.  Severe dilatation of aortic root 65 mm, moderate dilatation of the ascending aorta 45 mm.   Patient was called by [name] and advised to come into [name] for direct admission today.  He states he is feeling mostly the same, has been taking less salt, has not used any lasix at home. He still feels a little SOB, has random onset chest pain lasting few minutes, no syncope. His ankle swelling comes and goes. He did not noticed significant weight gain. His mother is here and states patient's speech is slurred at baseline with cognitive impairment since birth, this is limiting communication at baseline.     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   Murmur 05/26/2022   Osteoarthritis of left hip 01/16/2014   Osteoarthritis of right hip 05/15/2014   Primary localized osteoarthrosis of left shoulder  09/21/2017   PVC (premature ventricular contraction) 05/26/2022   Right ureteral stone    Speech impediment    Umbilical hernia     Past Surgical History:  Procedure Laterality Date   BACK SURGERY     CYSTOSCOPY W/ URETERAL STENT REMOVAL Right 04/19/2013   Procedure: CYSTOSCOPY WITH STENT REMOVAL;  Surgeon: Joe Hazard, MD;  Location: [name];  Service: Urology;  Laterality: Right;   CYSTOSCOPY WITH RETROGRADE PYELOGRAM, URETEROSCOPY AND STENT PLACEMENT Right 04/19/2013   Procedure: CYSTOSCOPY WITH RETROGRADE PYELOGRAM, URETEROSCOPY ;  Surgeon: Joe Hazard, MD;  Location: [name];  Service: Urology;  Laterality: Right;   CYSTOSCOPY WITH STENT PLACEMENT Right 03/24/2013   Procedure: CYSTOSCOPY WITH STENT PLACEMENT;  Surgeon: Joe Ben, MD;  Location: [name];  Service: Urology;  Laterality: Right;   HOLMIUM LASER APPLICATION Right 10/17/1094   Procedure: HOLMIUM LASER APPLICATION;  Surgeon: Joe Hazard, MD;  Location: [name];  Service: Urology;  Laterality: Right;   INGUINAL HERNIA REPAIR Left 03-04-2005   POSTERIOR LUMBAR FUSION  08-27-2011   L4 -- L5   RIGHT SHOULDER HEMIARTHROPLASTY  08-26-2010   OA   SHOULDER HEMI-ARTHROPLASTY Left 09/21/2017   Procedure: SHOULDER HEMI-ARTHROPLASTY;  Surgeon: Marchia Bond, MD;  Location: [name];  Service: Orthopedics;  Laterality: Left;   TONSILLECTOMY  AS CHILD   TOTAL HIP ARTHROPLASTY Left 01/16/2014   Procedure: TOTAL HIP ARTHROPLASTY;  Surgeon: Johnny Bridge, MD;  Location: [name];  Service: Orthopedics;  Laterality: Left;   TOTAL HIP ARTHROPLASTY Right 05/15/2014   Procedure: RIGHT TOTAL HIP ARTHROPLASTY;  Surgeon: Johnny Bridge, MD;  Location: [name];  Service: Orthopedics;  Laterality: Right;   TOTAL SHOULDER ARTHROPLASTY Right 2011     Medications Prior to Admission: Prior to Admission medications   Medication Sig Start Date End Date Taking? Authorizing  Provider  CALCIUM-VITAMIN D PO Take 2 tablets by mouth daily.    [provider]  Cyanocobalamin (B-12) 5000 MCG CAPS Take 5,000 mcg by mouth daily.    [provider]  fluticasone (FLONASE) 50 MCG/ACT nasal spray Place 2 sprays into both nostrils daily. 01/09/20   Minette Brine, FNP  furosemide (LASIX) 20 MG tablet Take 1 tablet (20 mg total) by mouth as needed for edema. 05/26/22 08/24/22  Joe Latch, MD  L-Theanine 200 MG CAPS Take 200 mg by mouth at bedtime.    [provider]  Multiple Vitamin (MULTIVITAMIN WITH MINERALS) TABS tablet Take 1 tablet by mouth daily.    [provider]  tamsulosin (FLOMAX) 0.4 MG CAPS capsule Take 0.4 mg by mouth 2 (two) times daily. 04/11/22   [provider]  UNABLE TO FIND daily. Med Name: Neuro optimizer    [provider]  UNABLE TO FIND Med Name: Neuro Zac: Take 2 in AM and 1 in PM    [provider]  vitamin C (ASCORBIC ACID) 500 MG tablet Take 500 mg by mouth daily.    [provider]     Allergies:    Allergies  Allergen Reactions   Codeine Nausea Only   Levaquin [Levofloxacin In D5w] Diarrhea    Social History:   Social History   Socioeconomic History   Marital status: Single    Spouse name: Not on file   Number of children: Not on file   Years of education: Not on file   Highest education level: Not on file  Occupational History   Occupation: unemployed  Tobacco Use   Smoking status: Never   Smokeless tobacco: Never  Vaping Use   Vaping Use: Never used  Substance and Sexual Activity   Alcohol use: No   Drug use: No   Sexual activity: Not Currently  Other Topics Concern   Not on file  Social History Narrative   Pt with delayed mental capabilities, 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  Lives with parents.   Social Determinants of Health   Financial Resource  Strain: Low Risk  (10/11/2019)   Overall Financial Resource Strain (CARDIA)    Difficulty of Paying Living Expenses: Not hard at all  Food Insecurity: No Food Insecurity (10/11/2019)   Hunger Vital Sign    Worried About Running Out of Food in the Last Year: Never true    Ran Out of Food in the Last Year: Never true  Transportation Needs: No Transportation Needs (10/11/2019)   PRAPARE - Hydrologist (Medical): No    Lack of Transportation (Non-Medical): No  Physical Activity: Sufficiently Active (10/11/2019)   Exercise Vital Sign    Days of Exercise per Week: 5 days    Minutes of Exercise per Session: 40 min  Stress: Stress Concern Present (10/11/2019)   [place]    Feeling of Stress : To some extent  Social Connections: Not on file  Intimate Partner Violence: Not on file    Family History:   The patient's family history includes Atrial fibrillation in his maternal grandmother and mother; Heart failure in his maternal grandmother and mother.    ROS:  Constitutional: Denied fever, chills, malaise, night sweats Eyes: Denied vision change or loss Ears/Nose/Mouth/Throat: Denied ear ache, sore throat, coughing, sinus pain Cardiovascular: see HPI  Respiratory: see HPI  Gastrointestinal: Denied nausea, vomiting, abdominal pain, diarrhea Genital/Urinary: Denied dysuria, hematuria, urinary frequency/urgency Musculoskeletal: Denied muscle ache, joint pain, weakness Skin: Denied rash, wound Neuro: Denied headache, dizziness, syncope Psych: Denied history of depression/anxiety  Endocrine: Denied history of diabetes   Physical Exam/Data:   Vitals:   06/25/22 1821  BP: (!) 156/51  Pulse: 68  Resp: 14  Temp: 98 F (36.7 C)  TempSrc: Oral  SpO2: 96%  Weight: 75.1 kg  Height: '5\' 8"'$  (1.727 m)   No intake or output data in the 24 hours ending 06/25/22 1828    06/25/2022    6:21 PM  05/26/2022    1:48 PM 01/09/2020    9:15 AM  Last 3 Weights  Weight (lbs) 165 lb 9.1 oz 171 lb 171 lb  Weight (kg) 75.1 kg 77.565 kg 77.565 kg     Body mass index is 25.17 kg/m.   Vitals:  Vitals:   06/25/22 1821  BP: (!) 156/51  Pulse: 68  Resp: 14  Temp: 98 F (36.7 C)  SpO2: 96%    General Appearance: In no  apparent distress, standing independently  HEENT: Normocephalic, atraumatic.  Neck: Supple, trachea midline Cardiovascular: Regular rate and rhythm, normal S1-S2,  grade III diastolic murmur LSB Respiratory: Resting breathing unlabored, lungs sounds clear to auscultation bilaterally, no use of accessory muscles. On room air. No rales.    Gastrointestinal: Bowel sounds positive, abdomen soft Extremities: Able to move all extremities in bed without difficulty, no edema of BLE Genitourinary: genital exam not performed Musculoskeletal: Normal muscle bulk and tone Skin: Intact, warm, dry. No rashes or petechiae noted in exposed areas.  Neurologic: Alert, oriented to person, place and time. Dysarthria. Mild coginitive impairment. Able to answer simple question and follow commands Psychiatric: Normal affect. Mood is appropriate.    EKG: Pending   Relevant CV Studies:  Echo from 06/25/22:   1. Left ventricular ejection fraction, by estimation, is 25 to 30%. The  left ventricle has severely decreased function. The left ventricle  demonstrates global hypokinesis. The left ventricular internal cavity size  was severely dilated. There is moderate   concentric left ventricular hypertrophy. Left ventricular diastolic  parameters are consistent with Grade I diastolic dysfunction (impaired  relaxation). The average left ventricular global longitudinal strain is  -7.0 %. The global longitudinal strain is  abnormal. No LV thrombus.   2. Right ventricular systolic function is normal. The right ventricular  size is normal. There is normal pulmonary artery systolic pressure.   3.  Left atrial size was severely dilated.   4. Right atrial size was mildly dilated.   5. The mitral valve is normal in structure. Mild mitral valve  regurgitation. No evidence of mitral stenosis.   6. The aortic valve is tricuspid. Aortic valve regurgitation is severe.  There is diatsolic flow reversal (aortic arch asssessment). Eccentric jet  related to root and Sinus of Valsalva dialtiont. Vena contracta 0.8 mm.   7. Pulmonic valve regurgitation is moderate.   8. Aortic dilatation noted. There is severe dilatation of the aortic  root, measuring 65 mm. There is moderate dilatation of the ascending  aorta, measuring 45 mm.   9. The inferior vena cava is normal in size with greater than 50%  respiratory variability, suggesting right atrial pressure of 3 mmHg.  10. Cannot exclude a small PFO.   Conclusion(s)/Recommendation(s): Severe aortic root dilation with severe  aortic regurgitation, LV dilation and dysfunction appears to be the  sequelae of chronic severe AI. Will reach out to primary cardiologist.    Laboratory Data:  High Sensitivity Troponin:  No results for input(s): "TROPONINIHS" in the last 720 hours.    ChemistryNo results for input(s): "NA", "K", "CL", "CO2", "GLUCOSE", "BUN", "CREATININE", "CALCIUM", "MG", "GFRNONAA", "GFRAA", "ANIONGAP" in the last 168 hours.  No results for input(s): "PROT", "ALBUMIN", "AST", "ALT", "ALKPHOS", "BILITOT" in the last 168 hours. Lipids No results for input(s): "CHOL", "TRIG", "HDL", "LABVLDL", "LDLCALC", "CHOLHDL" in the last 168 hours. HematologyNo results for input(s): "WBC", "RBC", "HGB", "HCT", "MCV", "MCH", "MCHC", "RDW", "PLT" in the last 168 hours. Thyroid No results for input(s): "TSH", "FREET4" in the last 168 hours. BNPNo results for input(s): "BNP", "PROBNP" in the last 168 hours.  DDimer No results for input(s): "DDIMER" in the last 168 hours.   Radiology/Studies:  ECHOCARDIOGRAM COMPLETE  Result Date: 06/25/2022     ECHOCARDIOGRAM REPORT   Patient Name:   [name] Date of Exam: 06/25/2022 Medical Rec #:  [id number]       Height:       64.0 in Accession #:  [id number]      Weight:       171.0 lb Date of Birth:  01-14-1956       BSA:          1.830 m Patient Age:    7 years        BP:           130/55 mmHg Patient Gender: M               HR:           69 bpm. Exam Location:  Outpatient Procedure: 2D Echo, 3D Echo, Cardiac Doppler, Color Doppler and Strain Analysis Indications:    PVC (premature ventricular contraction  History:        Patient has no prior history of Echocardiogram examinations.                 Abnormal ECG, Arrythmias:LBBB; Risk Factors:Non-Smoker. Mild                 cognitive impairment ;first degree heart block.  Sonographer:    Leavy Cella RDCS Referring Phys: [id number] TIFFANY [place] IMPRESSIONS  1. Left ventricular ejection fraction, by estimation, is 25 to 30%. The left ventricle has severely decreased function. The left ventricle demonstrates global hypokinesis. The left ventricular internal cavity size was severely dilated. There is moderate  concentric left ventricular hypertrophy. Left ventricular diastolic parameters are consistent with Grade I diastolic dysfunction (impaired relaxation). The average left ventricular global longitudinal strain is -7.0 %. The global longitudinal strain is abnormal. No LV thrombus.  2. Right ventricular systolic function is normal. The right ventricular size is normal. There is normal pulmonary artery systolic pressure.  3. Left atrial size was severely dilated.  4. Right atrial size was mildly dilated.  5. The mitral valve is normal in structure. Mild mitral valve regurgitation. No evidence of mitral stenosis.  6. The aortic valve is tricuspid. Aortic valve regurgitation is severe. There is diatsolic flow reversal (aortic arch asssessment). Eccentric jet related to root and Sinus of Valsalva dialtiont. Vena contracta 0.8 mm.  7. Pulmonic valve  regurgitation is moderate.  8. Aortic dilatation noted. There is severe dilatation of the aortic root, measuring 65 mm. There is moderate dilatation of the ascending aorta, measuring 45 mm.  9. The inferior vena cava is normal in size with greater than 50% respiratory variability, suggesting right atrial pressure of 3 mmHg. 10. Cannot exclude a small PFO. Conclusion(s)/Recommendation(s): Severe aortic root dilation with severe aortic regurgitation, LV dilation and dysfunction appears to be the sequelae of chronic severe AI. Will reach out to primary cardiologist. FINDINGS  Left Ventricle: Left ventricular ejection fraction, by estimation, is 25 to 30%. The left ventricle has severely decreased function. The left ventricle demonstrates global hypokinesis. The average left ventricular global longitudinal strain is -7.0 %. The global longitudinal strain is abnormal. 3D left ventricular ejection fraction analysis performed but not reported based on interpreter judgement due to suboptimal tracking. The left ventricular internal cavity size was severely dilated. There is moderate concentric left ventricular hypertrophy. Abnormal (paradoxical) septal motion, consistent with left bundle branch block. Left ventricular diastolic parameters are consistent with Grade I diastolic dysfunction (impaired relaxation). Right Ventricle: The right ventricular size is normal. No increase in right ventricular wall thickness. Right ventricular systolic function is normal. There is normal pulmonary artery systolic pressure. The tricuspid regurgitant velocity is 2.47 m/s, and  with an assumed right atrial pressure of 3 mmHg, the estimated right ventricular systolic  pressure is 27.4 mmHg. Left Atrium: Left atrial size was severely dilated. Right Atrium: Right atrial size was mildly dilated. Pericardium: There is no evidence of pericardial effusion. Mitral Valve: The mitral valve is normal in structure. Mild mitral valve regurgitation. No  evidence of mitral valve stenosis. Tricuspid Valve: The tricuspid valve is normal in structure. Tricuspid valve regurgitation is trivial. No evidence of tricuspid stenosis. Aortic Valve: The aortic valve is tricuspid. Aortic valve regurgitation is severe. Aortic regurgitation PHT measures 572 msec. Pulmonic Valve: The pulmonic valve was normal in structure. Pulmonic valve regurgitation is moderate. No evidence of pulmonic stenosis. Aorta: Aortic dilatation noted. There is severe dilatation of the aortic root, measuring 65 mm. There is moderate dilatation of the ascending aorta, measuring 45 mm. Venous: The inferior vena cava is normal in size with greater than 50% respiratory variability, suggesting right atrial pressure of 3 mmHg. IAS/Shunts: Cannot exclude a small PFO.  LEFT VENTRICLE PLAX 2D LVIDd:         7.56 cm      Diastology LVIDs:         6.48 cm      LV e' medial:    8.92 cm/s LV PW:         1.64 cm      LV E/e' medial:  4.5 LV IVS:        1.27 cm      LV e' lateral:   6.64 cm/s LVOT diam:     2.30 cm      LV E/e' lateral: 6.0 LV SV:         82 LV SV Index:   45           2D Longitudinal Strain LVOT Area:     4.15 cm     2D Strain GLS Avg:     -7.0 %  LV Volumes (MOD) LV vol d, MOD A2C: 470.0 ml 3D Volume EF: LV vol d, MOD A4C: 553.5 ml 3D EF:        34 % LV vol s, MOD A2C: 316.0 ml LV EDV:       426 ml LV vol s, MOD A4C: 385.0 ml LV ESV:       279 ml LV SV MOD A2C:     154.0 ml LV SV:        147 ml LV SV MOD A4C:     553.5 ml LV SV MOD BP:      155.1 ml RIGHT VENTRICLE RV Basal diam:  3.83 cm RV Mid diam:    3.28 cm RV S prime:     12.40 cm/s TAPSE (M-mode): 2.5 cm LEFT ATRIUM              Index        RIGHT ATRIUM           Index LA Vol (A2C):   142.0 ml 77.58 ml/m  RA Area:     24.00 cm LA Vol (A4C):   101.0 ml 55.18 ml/m  RA Volume:   69.80 ml  38.13 ml/m LA Biplane Vol: 119.0 ml 65.01 ml/m  AORTIC VALVE LVOT Vmax:   87.40 cm/s LVOT Vmean:  58.600 cm/s LVOT VTI:    0.197 m AI PHT:      572 msec   AORTA Ao Root diam: 4.40 cm Ao Asc diam:  4.00 cm MITRAL VALVE               TRICUSPID VALVE MV Area (PHT):  2.69 cm    TR Peak grad:   24.4 mmHg MV Decel Time: 282 msec    TR Vmax:        247.00 cm/s MR Peak grad: 59.9 mmHg MR Vmax:      387.00 cm/s  SHUNTS MV E velocity: 40.00 cm/s  Systemic VTI:  0.20 m MV A velocity: 59.50 cm/s  Systemic Diam: 2.30 cm MV E/A ratio:  0.67 Rudean Haskell MD Electronically signed by Rudean Haskell MD Signature Date/Time: 06/25/2022/1:18:27 PM    Final      Assessment and Plan:   Large aortic aneurysm Severe aortic regurgitation Acute systolic and diastolic heart failure - Several month onset of ankle edema and orthopnea, random chest pain without specific trigger, and diastolic murmur noted outpatient appointment 05/26/2022 - Echo today revealed LVEF 25 to 30%, global hypokinesis, severely dilated LV, moderate concentric LVH, grade 1 DD, no LV thrombus, normal RV, normal PASP, severe LAE, mild RAE, mild MR, severe aortic regurgitation, There is diatsolic flow reversal (aortic arch asssessment). Eccentric jet related to root and Sinus of Valsalva dialtiont. Vena contracta 0.8 mm.  Moderate pulmonic regurgitation, severe dilatation of aortic root 65 mm, moderate dilatation of ascending aorta at 45 mm - will admit to telemetry unit - will check admission labs including CBC, CMP, Mag, INR, BNP - will obtain CT coronary study (include aorta)  - will need CT surgery consult in AM , cath pending CT findings  - plan to start IV Lasix if hypervolemic , currently appears euvolemic  - GDMT: will avoid AV nodal agent to cause bradycardia, hold off ARNI/MRA/SGLT2I until more labs and workup completed this admission   BPH - resume tamsulosin 0.'4mg'$  BID  DVT prophylaxis  - start SQ heparin 5000 unit    Risk Assessment/Risk Scores:  { New York Heart Association (NYHA) Functional Class NYHA Class I     Severity of Illness: The appropriate patient status for  this patient is INPATIENT. Inpatient status is judged to be reasonable and necessary in order to provide the required intensity of service to ensure the patient's safety. The patient's presenting symptoms, physical exam findings, and initial radiographic and laboratory data in the context of their chronic comorbidities is felt to place them at high risk for further clinical deterioration. Furthermore, it is not anticipated that the patient will be medically stable for discharge from the [name] within 2 midnights of admission.   * I certify that at the point of admission it is my clinical judgment that the patient will require inpatient [name] care spanning beyond 2 midnights from the point of admission due to high intensity of service, high risk for further deterioration and high frequency of surveillance required.*   For questions or updates, please contact [facility] Please consult [email or web address] for contact info under     Signed, Margie Billet, NP  06/25/2022 6:28 PM

## 2022-06-26 ENCOUNTER — Inpatient Hospital Stay (HOSPITAL_COMMUNITY): Payer: Medicare Other

## 2022-06-26 ENCOUNTER — Other Ambulatory Visit (HOSPITAL_COMMUNITY): Payer: Self-pay

## 2022-06-26 DIAGNOSIS — I5042 Chronic combined systolic (congestive) and diastolic (congestive) heart failure: Secondary | ICD-10-CM | POA: Diagnosis not present

## 2022-06-26 DIAGNOSIS — I7121 Aneurysm of the ascending aorta, without rupture: Secondary | ICD-10-CM | POA: Diagnosis not present

## 2022-06-26 DIAGNOSIS — I2510 Atherosclerotic heart disease of native coronary artery without angina pectoris: Secondary | ICD-10-CM

## 2022-06-26 LAB — CBC
HCT: 41.7 % (ref 39.0–52.0)
Hemoglobin: 14.7 g/dL (ref 13.0–17.0)
MCH: 35.9 pg — ABNORMAL HIGH (ref 26.0–34.0)
MCHC: 35.3 g/dL (ref 30.0–36.0)
MCV: 101.7 fL — ABNORMAL HIGH (ref 80.0–100.0)
Platelets: 195 10*3/uL (ref 150–400)
RBC: 4.1 MIL/uL — ABNORMAL LOW (ref 4.22–5.81)
RDW: 12.8 % (ref 11.5–15.5)
WBC: 5.3 10*3/uL (ref 4.0–10.5)
nRBC: 0 % (ref 0.0–0.2)

## 2022-06-26 LAB — BASIC METABOLIC PANEL
Anion gap: 9 (ref 5–15)
BUN: 13 mg/dL (ref 8–23)
CO2: 20 mmol/L — ABNORMAL LOW (ref 22–32)
Calcium: 9.3 mg/dL (ref 8.9–10.3)
Chloride: 110 mmol/L (ref 98–111)
Creatinine, Ser: 0.84 mg/dL (ref 0.61–1.24)
GFR, Estimated: >60 mL/min (ref >60)
Glucose, Bld: 105 mg/dL — ABNORMAL HIGH (ref 70–99)
Potassium: 4.4 mmol/L (ref 3.5–5.1)
Sodium: 139 mmol/L (ref 135–145)

## 2022-06-26 LAB — HIV ANTIBODY (ROUTINE TESTING W REFLEX): HIV Screen 4th Generation wRfx: NONREACTIVE

## 2022-06-26 MED ORDER — LORAZEPAM 1 MG PO TABS
1.0000 mg | ORAL_TABLET | Freq: Every day | ORAL | Status: DC
  Administered 2022-06-26 – 2022-06-29 (×4): 1 mg via ORAL
  Filled 2022-06-26 (×4): qty 1

## 2022-06-26 MED ORDER — NITROGLYCERIN 0.4 MG SL SUBL
0.8000 mg | SUBLINGUAL_TABLET | Freq: Once | SUBLINGUAL | Status: AC
  Administered 2022-06-26: 0.8 mg via SUBLINGUAL

## 2022-06-26 MED ORDER — EMPAGLIFLOZIN 10 MG PO TABS
10.0000 mg | ORAL_TABLET | Freq: Every day | ORAL | Status: DC
  Administered 2022-06-26 – 2022-06-30 (×5): 10 mg via ORAL
  Filled 2022-06-26 (×5): qty 1

## 2022-06-26 MED ORDER — SENNOSIDES-DOCUSATE SODIUM 8.6-50 MG PO TABS
1.0000 | ORAL_TABLET | Freq: Every day | ORAL | Status: DC | PRN
  Administered 2022-06-26 – 2022-06-27 (×2): 1 via ORAL
  Filled 2022-06-26 (×2): qty 1

## 2022-06-26 MED ORDER — NITROGLYCERIN 0.4 MG SL SUBL
SUBLINGUAL_TABLET | SUBLINGUAL | Status: AC
  Filled 2022-06-26: qty 2

## 2022-06-26 MED ORDER — NITROGLYCERIN 0.4 MG SL SUBL: 0.8000 mg | SUBLINGUAL_TABLET | Freq: Once | SUBLINGUAL | Status: AC

## 2022-06-26 MED ORDER — METOPROLOL TARTRATE 5 MG/5ML IV SOLN
INTRAVENOUS | Status: AC
  Filled 2022-06-26: qty 5

## 2022-06-26 MED ORDER — SODIUM CHLORIDE 0.9% FLUSH
3.0000 mL | Freq: Two times a day (BID) | INTRAVENOUS | Status: DC
  Administered 2022-06-26 – 2022-06-29 (×6): 3 mL via INTRAVENOUS

## 2022-06-26 MED ORDER — IOHEXOL 350 MG/ML SOLN
195.0000 mL | Freq: Once | INTRAVENOUS | Status: AC | PRN
  Administered 2022-06-26: 195 mL via INTRAVENOUS

## 2022-06-26 MED ORDER — ONDANSETRON HCL 4 MG/2ML IJ SOLN
4.0000 mg | Freq: Four times a day (QID) | INTRAMUSCULAR | Status: DC | PRN
  Administered 2022-06-26 – 2022-06-28 (×2): 4 mg via INTRAVENOUS
  Filled 2022-06-26 (×2): qty 2

## 2022-06-26 MED ORDER — SACUBITRIL-VALSARTAN 24-26 MG PO TABS
1.0000 | ORAL_TABLET | Freq: Two times a day (BID) | ORAL | Status: DC
  Administered 2022-06-26 – 2022-06-30 (×9): 1 via ORAL
  Filled 2022-06-26 (×9): qty 1

## 2022-06-26 NOTE — Progress Notes (Addendum)
Patient in bed with no complaints had 7 beats V-tach then back to S.B. BBB 1st degree heart block rate in the 50's

## 2022-06-26 NOTE — Progress Notes (Signed)
Rounding Note    Patient Name: Joe [name] Date of Encounter: 06/26/2022  [place] Cardiologist: Skeet Latch, MD   Subjective   Patient doing well this AM. Denies chest pain, sob, dizziness, palpitations, orthopnea. Endorses mild ankle edema.   Inpatient Medications    Scheduled Meds:  fluticasone  2 spray Each Nare Daily   heparin  5,000 Units Subcutaneous Q8H   tamsulosin  0.4 mg Oral BID   Continuous Infusions:  PRN Meds: acetaminophen, alum & mag hydroxide-simeth, furosemide   Vital Signs    Vitals:   06/25/22 1821 06/25/22 1933 06/26/22 0057 06/26/22 0436  BP: (!) 156/51 (!) 137/51 (!) 143/54 (!) 122/51  Pulse: 68 (!) 52 62 61  Resp: '14 17 18 20  '$ Temp: 98 F (36.7 C) 97.8 F (36.6 C) (!) 97.5 F (36.4 C) 97.7 F (36.5 C)  TempSrc: Oral Oral Oral Oral  SpO2: 96% 95% 94% 96%  Weight: 75.1 kg   73.6 kg  Height: '5\' 8"'$  (1.727 m)       Intake/Output Summary (Last 24 hours) at 06/26/2022 0804 Last data filed at 06/26/2022 0352 Gross per 24 hour  Intake 420 ml  Output 1100 ml  Net -680 ml      06/26/2022    4:36 AM 06/25/2022    6:21 PM 05/26/2022    1:48 PM  Last 3 Weights  Weight (lbs) 162 lb 4.1 oz 165 lb 9.1 oz 171 lb  Weight (kg) 73.6 kg 75.1 kg 77.565 kg      Telemetry    Sinus rhythm with PVCs and PACs, HR in the 50s - Personally Reviewed  ECG     No new tracings - Personally Reviewed  Physical Exam   GEN: No acute distress. Sitting comfortably in the bed  Neck: No JVD Cardiac: RRR, grade 2/6 diastolic murmur at LSB Respiratory: Clear to auscultation bilaterally. Normal WOB on room air  GI: Soft, nontender, non-distended  MS: Trace edema; No deformity. Neuro:  Nonfocal  Psych: Normal affect   Labs    High Sensitivity Troponin:  No results for input(s): "TROPONINIHS" in the last 720 hours.   Chemistry Recent Labs  Lab 06/25/22 1829  NA 139  K 4.3  CL 106  CO2 24  GLUCOSE 96  BUN 15  CREATININE 0.83   CALCIUM 9.7  MG 2.0  PROT 6.6  ALBUMIN 4.0  AST 21  ALT 21  ALKPHOS 64  BILITOT 1.2  GFRNONAA >60  ANIONGAP 9    Lipids No results for input(s): "CHOL", "TRIG", "HDL", "LABVLDL", "LDLCALC", "CHOLHDL" in the last 168 hours.  Hematology Recent Labs  Lab 06/25/22 1829  WBC 7.2  RBC 4.12*  HGB 14.9  HCT 41.4  MCV 100.5*  MCH 36.2*  MCHC 36.0  RDW 12.7  PLT 218   Thyroid No results for input(s): "TSH", "FREET4" in the last 168 hours.  BNP Recent Labs  Lab 06/25/22 1829  BNP 436.2*    DDimer No results for input(s): "DDIMER" in the last 168 hours.   Radiology    ECHOCARDIOGRAM COMPLETE  Result Date: 06/25/2022    ECHOCARDIOGRAM REPORT   Patient Name:   Joe [name] Date of Exam: 06/25/2022 Medical Rec #:  [id number]       Height:       64.0 in Accession #:    [id number]      Weight:       171.0 lb Date of Birth:  1956/09/03  BSA:          1.830 m Patient Age:    66 years        BP:           130/55 mmHg Patient Gender: M               HR:           69 bpm. Exam Location:  Outpatient Procedure: 2D Echo, 3D Echo, Cardiac Doppler, Color Doppler and Strain Analysis Indications:    PVC (premature ventricular contraction  History:        Patient has no prior history of Echocardiogram examinations.                 Abnormal ECG, Arrythmias:LBBB; Risk Factors:Non-Smoker. Mild                 cognitive impairment ;first degree heart block.  Sonographer:    Leavy Cella RDCS Referring Phys: [id number] TIFFANY [place] IMPRESSIONS  1. Left ventricular ejection fraction, by estimation, is 25 to 30%. The left ventricle has severely decreased function. The left ventricle demonstrates global hypokinesis. The left ventricular internal cavity size was severely dilated. There is moderate  concentric left ventricular hypertrophy. Left ventricular diastolic parameters are consistent with Grade I diastolic dysfunction (impaired relaxation). The average left ventricular global longitudinal strain  is -7.0 %. The global longitudinal strain is abnormal. No LV thrombus.  2. Right ventricular systolic function is normal. The right ventricular size is normal. There is normal pulmonary artery systolic pressure.  3. Left atrial size was severely dilated.  4. Right atrial size was mildly dilated.  5. The mitral valve is normal in structure. Mild mitral valve regurgitation. No evidence of mitral stenosis.  6. The aortic valve is tricuspid. Aortic valve regurgitation is severe. There is diatsolic flow reversal (aortic arch asssessment). Eccentric jet related to root and Sinus of Valsalva dialtiont. Vena contracta 0.8 mm.  7. Pulmonic valve regurgitation is moderate.  8. Aortic dilatation noted. There is severe dilatation of the aortic root, measuring 65 mm. There is moderate dilatation of the ascending aorta, measuring 45 mm.  9. The inferior vena cava is normal in size with greater than 50% respiratory variability, suggesting right atrial pressure of 3 mmHg. 10. Cannot exclude a small PFO. Conclusion(s)/Recommendation(s): Severe aortic root dilation with severe aortic regurgitation, LV dilation and dysfunction appears to be the sequelae of chronic severe AI. Will reach out to primary cardiologist. FINDINGS  Left Ventricle: Left ventricular ejection fraction, by estimation, is 25 to 30%. The left ventricle has severely decreased function. The left ventricle demonstrates global hypokinesis. The average left ventricular global longitudinal strain is -7.0 %. The global longitudinal strain is abnormal. 3D left ventricular ejection fraction analysis performed but not reported based on interpreter judgement due to suboptimal tracking. The left ventricular internal cavity size was severely dilated. There is moderate concentric left ventricular hypertrophy. Abnormal (paradoxical) septal motion, consistent with left bundle branch block. Left ventricular diastolic parameters are consistent with Grade I diastolic dysfunction  (impaired relaxation). Right Ventricle: The right ventricular size is normal. No increase in right ventricular wall thickness. Right ventricular systolic function is normal. There is normal pulmonary artery systolic pressure. The tricuspid regurgitant velocity is 2.47 m/s, and  with an assumed right atrial pressure of 3 mmHg, the estimated right ventricular systolic pressure is 32.3 mmHg. Left Atrium: Left atrial size was severely dilated. Right Atrium: Right atrial size was mildly dilated. Pericardium: There is no evidence  of pericardial effusion. Mitral Valve: The mitral valve is normal in structure. Mild mitral valve regurgitation. No evidence of mitral valve stenosis. Tricuspid Valve: The tricuspid valve is normal in structure. Tricuspid valve regurgitation is trivial. No evidence of tricuspid stenosis. Aortic Valve: The aortic valve is tricuspid. Aortic valve regurgitation is severe. Aortic regurgitation PHT measures 572 msec. Pulmonic Valve: The pulmonic valve was normal in structure. Pulmonic valve regurgitation is moderate. No evidence of pulmonic stenosis. Aorta: Aortic dilatation noted. There is severe dilatation of the aortic root, measuring 65 mm. There is moderate dilatation of the ascending aorta, measuring 45 mm. Venous: The inferior vena cava is normal in size with greater than 50% respiratory variability, suggesting right atrial pressure of 3 mmHg. IAS/Shunts: Cannot exclude a small PFO.  LEFT VENTRICLE PLAX 2D LVIDd:         7.56 cm      Diastology LVIDs:         6.48 cm      LV e' medial:    8.92 cm/s LV PW:         1.64 cm      LV E/e' medial:  4.5 LV IVS:        1.27 cm      LV e' lateral:   6.64 cm/s LVOT diam:     2.30 cm      LV E/e' lateral: 6.0 LV SV:         82 LV SV Index:   45           2D Longitudinal Strain LVOT Area:     4.15 cm     2D Strain GLS Avg:     -7.0 %  LV Volumes (MOD) LV vol d, MOD A2C: 470.0 ml 3D Volume EF: LV vol d, MOD A4C: 553.5 ml 3D EF:        34 % LV vol s, MOD  A2C: 316.0 ml LV EDV:       426 ml LV vol s, MOD A4C: 385.0 ml LV ESV:       279 ml LV SV MOD A2C:     154.0 ml LV SV:        147 ml LV SV MOD A4C:     553.5 ml LV SV MOD BP:      155.1 ml RIGHT VENTRICLE RV Basal diam:  3.83 cm RV Mid diam:    3.28 cm RV S prime:     12.40 cm/s TAPSE (M-mode): 2.5 cm LEFT ATRIUM              Index        RIGHT ATRIUM           Index LA Vol (A2C):   142.0 ml 77.58 ml/m  RA Area:     24.00 cm LA Vol (A4C):   101.0 ml 55.18 ml/m  RA Volume:   69.80 ml  38.13 ml/m LA Biplane Vol: 119.0 ml 65.01 ml/m  AORTIC VALVE LVOT Vmax:   87.40 cm/s LVOT Vmean:  58.600 cm/s LVOT VTI:    0.197 m AI PHT:      572 msec  AORTA Ao Root diam: 4.40 cm Ao Asc diam:  4.00 cm MITRAL VALVE               TRICUSPID VALVE MV Area (PHT): 2.69 cm    TR Peak grad:   24.4 mmHg MV Decel Time: 282 msec    TR Vmax:  247.00 cm/s MR Peak grad: 59.9 mmHg MR Vmax:      387.00 cm/s  SHUNTS MV E velocity: 40.00 cm/s  Systemic VTI:  0.20 m MV A velocity: 59.50 cm/s  Systemic Diam: 2.30 cm MV E/A ratio:  0.67 Rudean Haskell MD Electronically signed by Rudean Haskell MD Signature Date/Time: 06/25/2022/1:18:27 PM    Final     Cardiac Studies   Echocardiogram 06/25/2022  1. Left ventricular ejection fraction, by estimation, is 25 to 30%. The  left ventricle has severely decreased function. The left ventricle  demonstrates global hypokinesis. The left ventricular internal cavity size  was severely dilated. There is moderate   concentric left ventricular hypertrophy. Left ventricular diastolic  parameters are consistent with Grade I diastolic dysfunction (impaired  relaxation). The average left ventricular global longitudinal strain is  -7.0 %. The global longitudinal strain is  abnormal. No LV thrombus.   2. Right ventricular systolic function is normal. The right ventricular  size is normal. There is normal pulmonary artery systolic pressure.   3. Left atrial size was severely dilated.   4.  Right atrial size was mildly dilated.   5. The mitral valve is normal in structure. Mild mitral valve  regurgitation. No evidence of mitral stenosis.   6. The aortic valve is tricuspid. Aortic valve regurgitation is severe.  There is diatsolic flow reversal (aortic arch asssessment). Eccentric jet  related to root and Sinus of Valsalva dialtiont. Vena contracta 0.8 mm.   7. Pulmonic valve regurgitation is moderate.   8. Aortic dilatation noted. There is severe dilatation of the aortic  root, measuring 65 mm. There is moderate dilatation of the ascending  aorta, measuring 45 mm.   9. The inferior vena cava is normal in size with greater than 50%  respiratory variability, suggesting right atrial pressure of 3 mmHg.  10. Cannot exclude a small PFO.   Conclusion(s)/Recommendation(s): Severe aortic root dilation with severe  aortic regurgitation, LV dilation and dysfunction appears to be the  sequelae of chronic severe AI. Will reach out to primary cardiologist.   Patient Profile     66 y.o. male  with PMH of  anxiety, mild cognitive impairment, LBBB, first degree AVB, who is being seen for the evaluation of aortic aneurysm, newly diagnosed CHF   Assessment & Plan    Large aortic aneurysm Severe aortic regurgitation Acute systolic and diastolic heart failure - Patient complains of several month onset of ankle edema and orthopnea, random chest pain without specific trigger. Seen for an outpatient visit on 05/26/2022 where a new diastolic murmur was noted - Echo 9/14 showed LVEF 25-30%, global hypokinesis, severely dilated LV, moderate concentric LVH, grade 1 DD, no LV thrombus, normal RV, normal PASP, severe LAE, mild RAE, mild MR, severe aortic regurgitation. There is diatsolic flow reversal (aortic arch asssessment). Eccentric jet related to root and Sinus of Valsalva dialtiont. Vena contracta 0.8 mm.  Moderate pulmonic regurgitation, severe dilatation of aortic root 65 mm, moderate dilatation  of ascending aorta at 45 mm - Ordered CT coronary study (include aorta)- I have messaged CT to help arrange. May need cath pending results  - Likely will need CT surgery consult - Given newly reduced EF, patient should be started on GDMT. BP has been mildly elevated and patient has good renal function.  Start low dose entresto today  - Start jardiance 10 mg daily  - Patient takes PRN lasix at home. Euvolemic today so does not need lasix  - Note-- will avoid  AV nodal agents due to history of bradycardia    BPH - continue tamsulosin 0.'4mg'$  BID   DVT prophylaxis  - continue SQ heparin 5000 unit   For questions or updates, please contact [facility] Please consult [email or web address] for contact info under        Signed, Margie Billet, PA-C  06/26/2022, 8:04 AM

## 2022-06-26 NOTE — TOC Benefit Eligibility Note (Signed)
Patient Research scientist (life sciences) completed.     The patient is currently admitted and upon discharge could be taking ENTRESTO 24-26.   The current 30 day co-pay is, $47.   The patient is insured through [employer].

## 2022-06-27 DIAGNOSIS — I25119 Atherosclerotic heart disease of native coronary artery with unspecified angina pectoris: Secondary | ICD-10-CM

## 2022-06-27 DIAGNOSIS — I5020 Unspecified systolic (congestive) heart failure: Secondary | ICD-10-CM | POA: Diagnosis not present

## 2022-06-27 DIAGNOSIS — I351 Nonrheumatic aortic (valve) insufficiency: Secondary | ICD-10-CM

## 2022-06-27 DIAGNOSIS — I712 Thoracic aortic aneurysm, without rupture, unspecified: Secondary | ICD-10-CM

## 2022-06-27 DIAGNOSIS — I7121 Aneurysm of the ascending aorta, without rupture: Secondary | ICD-10-CM

## 2022-06-27 NOTE — Consult Note (Signed)
[address]       [place] [address]             [phone]           Itzae A Persaud [place] Medical Record #[id number] Date of Birth: 03/12/1956  Skeet Latch, MD Willey Blade, MD  Chief Complaint:    Chief Complaint  Patient presents with   Aortic Aneurysm    History of Present Illness:     I have been asked to see this 66 yo wm with a new diagnosis of an aortic root aneurysm and severe AI. Pt reportedly from mother was seen months ago by new NP and heard carotid sound that along with EKG was asked to see cardiology.Pt was evaluated with holter monitor and was asked to start lasix for lower extremity edema which he took PRN. Pt has been very active with bike riding over 5 miles a day. Mother report he has been felling well with no CP or significant DOE just the lower extremity edema. He was seen by Cardiology and an echo was performed with severe AI, severe dilation of the LV, and a 6 cm aortic root aneurysm. Pt was asked to report for futher inpt work up with coronary CTA and now with concern for LAD and RCA stenosis. Pt has remained asymptomatic since admission.     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   Murmur 05/26/2022   Osteoarthritis of left hip 01/16/2014   Osteoarthritis of right hip 05/15/2014   Primary localized osteoarthrosis of left shoulder 09/21/2017   PVC (premature ventricular contraction) 05/26/2022   Right ureteral stone    Speech impediment    Umbilical hernia     Past Surgical History:  Procedure Laterality Date   BACK SURGERY     CYSTOSCOPY W/ URETERAL STENT  REMOVAL Right 04/19/2013   Procedure: CYSTOSCOPY WITH STENT REMOVAL;  Surgeon: Molli Hazard, MD;  Location: [facility];  Service: Urology;  Laterality: Right;   CYSTOSCOPY WITH RETROGRADE PYELOGRAM, URETEROSCOPY AND STENT PLACEMENT Right 04/19/2013   Procedure: CYSTOSCOPY WITH RETROGRADE PYELOGRAM, URETEROSCOPY ;  Surgeon: Molli Hazard, MD;  Location: [facility];  Service: Urology;  Laterality: Right;   CYSTOSCOPY WITH STENT PLACEMENT Right 03/24/2013   Procedure: CYSTOSCOPY WITH STENT PLACEMENT;  Surgeon: Hanley Ben, MD;  Location: [facility];  Service: Urology;  Laterality: Right;   HOLMIUM LASER APPLICATION Right 10/18/6158   Procedure: HOLMIUM LASER APPLICATION;  Surgeon: Molli Hazard, MD;  Location: [facility];  Service: Urology;  Laterality: Right;   INGUINAL HERNIA REPAIR Left 03-04-2005   POSTERIOR LUMBAR FUSION  08-27-2011   L4 -- L5   RIGHT SHOULDER HEMIARTHROPLASTY  08-26-2010   OA   SHOULDER HEMI-ARTHROPLASTY Left 09/21/2017   Procedure: SHOULDER HEMI-ARTHROPLASTY;  Surgeon: Marchia Bond, MD;  Location: [place];  Service: Orthopedics;  Laterality: Left;   TONSILLECTOMY  AS CHILD   TOTAL HIP ARTHROPLASTY Left 01/16/2014   Procedure: TOTAL HIP ARTHROPLASTY;  Surgeon: Vonna Kotyk  Llana Aliment, MD;  Location: [place];  Service: Orthopedics;  Laterality: Left;   TOTAL HIP ARTHROPLASTY Right 05/15/2014   Procedure: RIGHT TOTAL HIP ARTHROPLASTY;  Surgeon: Johnny Bridge, MD;  Location: [place];  Service: Orthopedics;  Laterality: Right;   TOTAL SHOULDER ARTHROPLASTY Right 2011    Social History   Tobacco Use  Smoking Status Never  Smokeless Tobacco Never    Social History   Substance and Sexual Activity  Alcohol Use No    Social History   Socioeconomic History   Marital status: Single    Spouse name: Not on file   Number of children: Not on file   Years of education: Not on file   Highest education level: Not on file   Occupational History   Occupation: unemployed  Tobacco Use   Smoking status: Never   Smokeless tobacco: Never  Vaping Use   Vaping Use: Never used  Substance and Sexual Activity   Alcohol use: No   Drug use: No   Sexual activity: Not Currently  Other Topics Concern   Not on file  Social History Narrative   Pt with delayed mental capabilities, 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  Lives with parents.   Social Determinants of Health   Financial Resource Strain: Low Risk  (10/11/2019)   Overall Financial Resource Strain (CARDIA)    Difficulty of Paying Living Expenses: Not hard at all  Food Insecurity: No Food Insecurity (10/11/2019)   Hunger Vital Sign    Worried About Running Out of Food in the Last Year: Never true    Ran Out of Food in the Last Year: Never true  Transportation Needs: No Transportation Needs (10/11/2019)   PRAPARE - Hydrologist (Medical): No    Lack of Transportation (Non-Medical): No  Physical Activity: Sufficiently Active (10/11/2019)   Exercise Vital Sign    Days of Exercise per Week: 5 days    Minutes of Exercise per Session: 40 min  Stress: Stress Concern Present (10/11/2019)   [place]    Feeling of Stress : To some extent  Social Connections: Not on file  Intimate Partner Violence: Not on file    Allergies  Allergen Reactions   Codeine Nausea Only   Levaquin [Levofloxacin In D5w] Diarrhea    Current Facility-Administered Medications  Medication Dose Route Frequency Provider Last Rate Last Admin   acetaminophen (TYLENOL) tablet 1,000 mg  1,000 mg Oral Q6H PRN Marykay Lex, MD   1,000 mg at 06/26/22 1927   alum & mag hydroxide-simeth (MAALOX/MYLANTA) 200-200-20 MG/5ML suspension 30 mL  30 mL Oral Q4H PRN Skeet Latch, MD   30 mL at 06/25/22 2131    empagliflozin (JARDIANCE) tablet 10 mg  10 mg Oral Daily Margie Billet, PA-C   10 mg at 06/27/22 0914   fluticasone (FLONASE) 50 MCG/ACT nasal spray 2 spray  2 spray Each Nare Daily Margie Billet, NP   2 spray at 06/27/22 0913   furosemide (LASIX) tablet 20 mg  20 mg Oral PRN Margie Billet, NP       heparin injection 5,000 Units  5,000 Units Subcutaneous Q8H Margie Billet, NP   5,000 Units at 06/27/22 1400   LORazepam (ATIVAN) tablet 1 mg  1 mg Oral QHS Skeet Latch, MD   1 mg at 06/26/22  2213   ondansetron (ZOFRAN) injection 4 mg  4 mg Intravenous Q6H PRN Skeet Latch, MD   4 mg at 06/26/22 1134   sacubitril-valsartan (ENTRESTO) 24-26 mg per tablet  1 tablet Oral BID Margie Billet, PA-C   1 tablet at 06/27/22 6378   senna-docusate (Senokot-S) tablet 1 tablet  1 tablet Oral Daily PRN Skeet Latch, MD   1 tablet at 06/26/22 1931   sodium chloride flush (NS) 0.9 % injection 3 mL  3 mL Intravenous Q12H Margie Billet, PA-C   3 mL at 06/27/22 5885   tamsulosin (FLOMAX) capsule 0.4 mg  0.4 mg Oral BID Margie Billet, NP   0.4 mg at 06/27/22 0277     Family History  Problem Relation Age of Onset   Heart failure Mother    Atrial fibrillation Mother    Heart failure Maternal Grandmother    Atrial fibrillation Maternal Grandmother    STUDIES  I have personally reviewed the following studies and concur with the findings  ECHO 06/25/22 IMPRESSIONS   1. Left ventricular ejection fraction, by estimation, is 25 to 30%. The  left ventricle has severely decreased function. The left ventricle  demonstrates global hypokinesis. The left ventricular internal cavity size  was severely dilated. There is moderate   concentric left ventricular hypertrophy. Left ventricular diastolic  parameters are consistent with Grade I diastolic dysfunction (impaired  relaxation). The average left ventricular global longitudinal strain is  -7.0 %. The global longitudinal strain is  abnormal. No LV  thrombus.   2. Right ventricular systolic function is normal. The right ventricular  size is normal. There is normal pulmonary artery systolic pressure.   3. Left atrial size was severely dilated.   4. Right atrial size was mildly dilated.   5. The mitral valve is normal in structure. Mild mitral valve  regurgitation. No evidence of mitral stenosis.   6. The aortic valve is tricuspid. Aortic valve regurgitation is severe.  There is diatsolic flow reversal (aortic arch asssessment). Eccentric jet  related to root and Sinus of Valsalva dialtiont. Vena contracta 0.8 mm.   7. Pulmonic valve regurgitation is moderate.   8. Aortic dilatation noted. There is severe dilatation of the aortic  root, measuring 65 mm. There is moderate dilatation of the ascending  aorta, measuring 45 mm.   9. The inferior vena cava is normal in size with greater than 50%  respiratory variability, suggesting right atrial pressure of 3 mmHg.  10. Cannot exclude a small PFO.   Conclusion(s)/Recommendation(s): Severe aortic root dilation with severe  aortic regurgitation, LV dilation and dysfunction appears to be the  sequelae of chronic severe AI. Will reach out to primary cardiologist.   FINDINGS   Left Ventricle: Left ventricular ejection fraction, by estimation, is 25  to 30%. The left ventricle has severely decreased function. The left  ventricle demonstrates global hypokinesis. The average left ventricular  global longitudinal strain is -7.0 %.  The global longitudinal strain is abnormal. 3D left ventricular ejection  fraction analysis performed but not reported based on interpreter  judgement due to suboptimal tracking. The left ventricular internal cavity  size was severely dilated. There is  moderate concentric left ventricular hypertrophy. Abnormal (paradoxical)  septal motion, consistent with left bundle branch block. Left ventricular  diastolic parameters are consistent with Grade I diastolic dysfunction   (impaired relaxation).   Right Ventricle: The right ventricular size is normal. No increase in  right ventricular wall thickness. Right ventricular systolic function is  normal. There is normal pulmonary artery systolic pressure. The tricuspid  regurgitant velocity is 2.47 m/s, and   with an assumed right atrial pressure of 3 mmHg, the estimated right  ventricular systolic pressure is 56.2 mmHg.   Left Atrium: Left atrial size was severely dilated.   Right Atrium: Right atrial size was mildly dilated.   Pericardium: There is no evidence of pericardial effusion.   Mitral Valve: The mitral valve is normal in structure. Mild mitral valve  regurgitation. No evidence of mitral valve stenosis.   Tricuspid Valve: The tricuspid valve is normal in structure. Tricuspid  valve regurgitation is trivial. No evidence of tricuspid stenosis.   Aortic Valve: The aortic valve is tricuspid. Aortic valve regurgitation is  severe. Aortic regurgitation PHT measures 572 msec.   Pulmonic Valve: The pulmonic valve was normal in structure. Pulmonic valve  regurgitation is moderate. No evidence of pulmonic stenosis.   Aorta: Aortic dilatation noted. There is severe dilatation of the aortic  root, measuring 65 mm. There is moderate dilatation of the ascending  aorta, measuring 45 mm.   Venous: The inferior vena cava is normal in size with greater than 50%  respiratory variability, suggesting right atrial pressure of 3 mmHg.   IAS/Shunts: Cannot exclude a small PFO.    CT Coronary CTA 06/26/22 FINDINGS: Image quality: Average   Noise artifact is: Moderately reduced signal to noise, contrast to noise ratios. Cardiac motion artifact.   AORTA: Severe dilation of the ascending aorta at the sinus of Valsalva. No dissection. No calcifications.   Measurements made in double oblique technique:   Sinus of Valsalva, measured sinus to sinus technique:   R-L: 66 mm   L-Non: 67 mm   R-Non: 65 mm    STJ: 46 mm   Mid ascending aorta (at PA bifurcation): 44 mm   Coronary calcium score is 693, which places the patient in the 82nd percentile for age and sex matched control.   Coronary arteries: Normal coronary origins.  Right dominance.   Right Coronary Artery: Mild mixed atherosclerotic plaque in the proximal RCA, 25-49% stenosis. Distal RCA is diffusely disease however due to image quality, luminal stenosis cannot be fully evaluated. Possible severe stenosis in the distal RCA, 70-99% stenosis. Which are patent   Left Main Coronary Artery: Minimal mixed atherosclerotic plaque in the ostial left main, <25% stenosis.   Left Anterior Descending Coronary Artery: Probable severe mixed atherosclerotic plaque in the mid LAD, 70-99% stenosis. Moderate calcified plaque in the mid-distal LAD at the level of the bifurcation of the second diagonal, 50-69% stenosis.   Left Circumflex Artery: No detectable plaque or stenosis. Small OM1 and OM2, with large OM3 and diminutive distal circumflex artery.   Aortic Valve: Trivial calcifications. AV calcium score 99.   Other findings:   Normal pulmonary vein drainage into the left atrium.   Normal left atrial appendage without thrombus.   Moderate-severe dilation of main pulmonary artery, 39 mm.   IMPRESSION: Motion and noise artifact, as well as reduced contrast density degrade the diagnostic quality of this exam.   1. Ascending aortic aneurysm with severe dilation at the sinus of Valsalva, 67 mm. Moderate dilation of mid ascending aorta, 44 mm.   2. Probable severe CAD in mid LAD and distal RCA, 70-99% stenosis, CADRADS 4. FFR could not be fully processed due to image quality.   3. Coronary calcium score is 693, which places the patient in the 82nd percentile for age and sex matched control.   4.  Normal coronary origins with right dominance.   5. Moderate-severe dilation of main pulmonary artery, 39 mm, suggests pulmonary  hypertension. Alternatively in setting of dilated aorta, may indicate vascular pathology.    Physical Exam: BP (!) 92/46 (BP Location: Left Arm)   Pulse 66   Temp 97.7 F (36.5 C) (Oral)   Resp 20   Ht '5\' 8"'$  (1.727 m)   Wt 73.6 kg   SpO2 96%   BMI 24.67 kg/m  General: cooperative, engaged HEENT: teeth in relatively good repair Lungs: overall clear Heart: reg with frequent extra sysptolies, loud blowing AI murmur Ext: no edema   Diagnostic Studies & Laboratory data:     Recent Radiology Findings:      Recent Lab Findings: Lab Results  Component Value Date   WBC 5.3 06/26/2022   HGB 14.7 06/26/2022   HCT 41.7 06/26/2022   PLT 195 06/26/2022   GLUCOSE 105 (H) 06/26/2022   CHOL 203 (H) 10/11/2019   TRIG 79 10/11/2019   HDL 80 10/11/2019   LDLCALC 109 (H) 10/11/2019   ALT 21 06/25/2022   AST 21 06/25/2022   NA 139 06/26/2022   K 4.4 06/26/2022   CL 110 06/26/2022   CREATININE 0.84 06/26/2022   BUN 13 06/26/2022   CO2 20 (L) 06/26/2022   TSH 1.208 12/12/2012   INR 1.1 06/25/2022      Assessment / Plan:      Pt is a 66 yo wm with newly diagnosed severe AI, severe LV dilation and cardiomyopathy with EF 25-30% and with Aortic root aneurysm and possible CAD   Pt will require operative intervention with aortic root replacement and possible CABG which will be at increased risks due to severely dilated LV and cardiomyopathy. Will await cath results and would recommend heart failure evaluation preoperatively to assess further options for medical improvement prior to surgical intervention. I have spent over 60 min in personally reviewing all the studies and notes, discussion with mother and pt of the current situation and need for intervention and discussion of timing of that operation and in coordination of care      '@me1'$ @ 06/27/2022 5:36 PM

## 2022-06-27 NOTE — Progress Notes (Signed)
Progress Note  Patient Name: Joe [name] Date of Encounter: 06/27/2022  Primary Cardiologist: Skeet Latch, MD  Subjective   Sitting in bedside chair, no complaints this morning.  Inpatient Medications    Scheduled Meds:  empagliflozin  10 mg Oral Daily   fluticasone  2 spray Each Nare Daily   heparin  5,000 Units Subcutaneous Q8H   LORazepam  1 mg Oral QHS   sacubitril-valsartan  1 tablet Oral BID   sodium chloride flush  3 mL Intravenous Q12H   tamsulosin  0.4 mg Oral BID    PRN Meds: acetaminophen, alum & mag hydroxide-simeth, furosemide, ondansetron (ZOFRAN) IV, senna-docusate   Vital Signs    Vitals:   06/26/22 2358 06/27/22 0322 06/27/22 0323 06/27/22 0749  BP: (!) 106/53 (!) 113/54 (!) 113/54 (!) 100/44  Pulse: 68 66 70 66  Resp: 20 (!) 76 18 20  Temp: 97.8 F (36.6 C) 97.7 F (36.5 C) 97.7 F (36.5 C) (!) 97.4 F (36.3 C)  TempSrc: Oral Oral Oral Oral  SpO2: 91% 94% 93% 95%  Weight:      Height:        Intake/Output Summary (Last 24 hours) at 06/27/2022 1145 Last data filed at 06/27/2022 0600 Gross per 24 hour  Intake 720 ml  Output 1425 ml  Net -705 ml   Filed Weights   06/25/22 1821 06/26/22 0436  Weight: 75.1 kg 73.6 kg    Telemetry    Sinus rhythm with burst of NSVT.  Personally reviewed.  ECG    An ECG dated 06/25/2022 was personally reviewed today and demonstrated:  Sinus rhythm with prolonged PR interval, left bundle branch block, PVCs.  Physical Exam   GEN: No acute distress.   Neck: No JVD. Cardiac: RRR, 2/6 diastolic murmur, rub, or gallop.  Respiratory: Nonlabored. Clear to auscultation bilaterally. GI: Soft, nontender, bowel sounds present. MS: No edema; No deformity. Neuro:  Nonfocal. Psych: Normal affect.  Labs    Chemistry Recent Labs  Lab 06/25/22 1829 06/26/22 0731  NA 139 139  K 4.3 4.4  CL 106 110  CO2 24 20*  GLUCOSE 96 105*  BUN 15 13  CREATININE 0.83 0.84  CALCIUM 9.7 9.3  PROT 6.6  --    ALBUMIN 4.0  --   AST 21  --   ALT 21  --   ALKPHOS 64  --   BILITOT 1.2  --   GFRNONAA >60 >60  ANIONGAP 9 9     Hematology Recent Labs  Lab 06/25/22 1829 06/26/22 0731  WBC 7.2 5.3  RBC 4.12* 4.10*  HGB 14.9 14.7  HCT 41.4 41.7  MCV 100.5* 101.7*  MCH 36.2* 35.9*  MCHC 36.0 35.3  RDW 12.7 12.8  PLT 218 195    BNP Recent Labs  Lab 06/25/22 1829  BNP 436.2*     Radiology    CT ANGIO CHEST AORTA W/ & OR WO/CM & GATING ([place] ONLY)  Result Date: 06/26/2022 EXAM: OVER-READ INTERPRETATION  CT CHEST The following report is a limited chest CT over-read performed by radiologist Dr. Markus Daft of [facility] on 06/26/2022. This over-read does not include interpretation of cardiac or coronary anatomy or pathology. The coronary CTA interpretation by the cardiologist is attached. COMPARISON:  None Available. FINDINGS: Vascular: Enlargement of the ascending thoracic aorta and aortic root. Cardiac enlargement. Refer to the separate cardiologist report. Mediastinum/Nodes: Visualized mediastinal structures are normal. Lungs/Pleura: Volume loss in the bilateral lower lobes. Probable 3  mm calcified granuloma in the periphery of the left upper lobe on sequence 11 image 7. No large areas of consolidation or airspace disease in the visualized lungs. No significant pleural effusions. Upper Abdomen: Images of the upper abdomen are unremarkable. Musculoskeletal: No acute bone abnormality. IMPRESSION: 1. No acute extracardiac findings. 2. Enlargement of the ascending thoracic aorta and aortic root. Refer to the cardiologist report. Electronically Signed   By: Markus Daft M.D.   On: 06/26/2022 16:25   CT CORONARY MORPH W/CTA COR W/SCORE W/CA W/CM &/OR WO/CM  Result Date: 06/26/2022 HISTORY: Chest pain, nonspecific EXAM: Cardiac/Coronary  CT TECHNIQUE: The patient was scanned on a [employer]. PROTOCOL: A 120 kV prospective scan was triggered in the descending thoracic aorta  at 111 HU's. Axial non-contrast 3 mm slices were carried out through the heart. The data set was analyzed on a dedicated work station and scored using the Agatston method. Gantry rotation speed was 250 msecs and collimation was .6 mm. Beta blockade and 0.8 mg of sl NTG was given. The 3D data set was reconstructed in 5% intervals of the 35-75 % of the R-R cycle. Systolic and diastolic phases were analyzed on a dedicated work station using MPR, MIP and VRT modes. The patient received contrast: 179m OMNIPAQUE IOHEXOL 350 MG/ML SOLN. FINDINGS: Image quality: Average Noise artifact is: Moderately reduced signal to noise, contrast to noise ratios. Cardiac motion artifact. AORTA: Severe dilation of the ascending aorta at the sinus of Valsalva. No dissection. No calcifications. Measurements made in double oblique technique: Sinus of Valsalva, measured sinus to sinus technique: R-L: 66 mm L-Non: 67 mm R-Non: 65 mm STJ: 46 mm Mid ascending aorta (at PA bifurcation): 44 mm Coronary calcium score is 693, which places the patient in the 82nd percentile for age and sex matched control. Coronary arteries: Normal coronary origins.  Right dominance. Right Coronary Artery: Mild mixed atherosclerotic plaque in the proximal RCA, 25-49% stenosis. Distal RCA is diffusely disease however due to image quality, luminal stenosis cannot be fully evaluated. Possible severe stenosis in the distal RCA, 70-99% stenosis. Which are patent Left Main Coronary Artery: Minimal mixed atherosclerotic plaque in the ostial left main, <25% stenosis. Left Anterior Descending Coronary Artery: Probable severe mixed atherosclerotic plaque in the mid LAD, 70-99% stenosis. Moderate calcified plaque in the mid-distal LAD at the level of the bifurcation of the second diagonal, 50-69% stenosis. Left Circumflex Artery: No detectable plaque or stenosis. Small OM1 and OM2, with large OM3 and diminutive distal circumflex artery. Aortic Valve: Trivial calcifications. AV  calcium score 99. Other findings: Normal pulmonary vein drainage into the left atrium. Normal left atrial appendage without thrombus. Moderate-severe dilation of main pulmonary artery, 39 mm. IMPRESSION: Motion and noise artifact, as well as reduced contrast density degrade the diagnostic quality of this exam. 1. Ascending aortic aneurysm with severe dilation at the sinus of Valsalva, 67 mm. Moderate dilation of mid ascending aorta, 44 mm. 2. Probable severe CAD in mid LAD and distal RCA, 70-99% stenosis, CADRADS 4. FFR could not be fully processed due to image quality. 3. Coronary calcium score is 693, which places the patient in the 82nd percentile for age and sex matched control. 4. Normal coronary origins with right dominance. 5. Moderate-severe dilation of main pulmonary artery, 39 mm, suggests pulmonary hypertension. Alternatively in setting of dilated aorta, may indicate vascular pathology. RECOMMENDATIONS: CAD-RADS 4 Severe stenosis. (70-99% or > 50% left main). Cardiac catheterization or CT FFR is recommended. Consider symptom-guided anti-ischemic pharmacotherapy as  well as risk factor modification per guideline directed care. Electronically Signed   By: Cherlynn Kaiser M.D.   On: 06/26/2022 16:06    Cardiac Studies   Echocardiogram 06/25/2022:  1. Left ventricular ejection fraction, by estimation, is 25 to 30%. The  left ventricle has severely decreased function. The left ventricle  demonstrates global hypokinesis. The left ventricular internal cavity size  was severely dilated. There is moderate   concentric left ventricular hypertrophy. Left ventricular diastolic  parameters are consistent with Grade I diastolic dysfunction (impaired  relaxation). The average left ventricular global longitudinal strain is  -7.0 %. The global longitudinal strain is  abnormal. No LV thrombus.   2. Right ventricular systolic function is normal. The right ventricular  size is normal. There is normal pulmonary  artery systolic pressure.   3. Left atrial size was severely dilated.   4. Right atrial size was mildly dilated.   5. The mitral valve is normal in structure. Mild mitral valve  regurgitation. No evidence of mitral stenosis.   6. The aortic valve is tricuspid. Aortic valve regurgitation is severe.  There is diatsolic flow reversal (aortic arch asssessment). Eccentric jet  related to root and Sinus of Valsalva dialtiont. Vena contracta 0.8 mm.   7. Pulmonic valve regurgitation is moderate.   8. Aortic dilatation noted. There is severe dilatation of the aortic  root, measuring 65 mm. There is moderate dilatation of the ascending  aorta, measuring 45 mm.   9. The inferior vena cava is normal in size with greater than 50%  respiratory variability, suggesting right atrial pressure of 3 mmHg.  10. Cannot exclude a small PFO.   Assessment & Plan    1.  Large ascending aortic aneurysm with severe dilatation of the sinus of Valsalva measuring 67 mm, moderately dilated mid ascending aorta at 44 mm.  2.  HFrEF with LVEF 25 to 30% and global hypokinesis by recent echocardiogram.  Duration uncertain.  3.  Severe aortic regurgitation by recent echocardiogram likely in association with problem #1.  Could also be contributing to cardiomyopathy.  4.  CAD by recent coronary CTA with suspected severe mid LAD stenosis and distal RCA stenosis.  Calcium score 693.  5.  Significantly dilated main pulmonary artery at 39 mm.  Pulmonic regurgitation was moderate by recent echocardiogram but estimated RVSP normal range.  This needs to be clarified in terms of potential pulmonary hypertension.  Chart reviewed in detail including recent work-up by Dr. Oval Linsey.  I see orders for patient to undergo cardiac catheterization on Monday, should be left and right heart procedure to clarify both coronary anatomy and also evaluate for possible pulmonary hypertension.  TCTS consult also will be requested.  Currently on  [place].  Signed, Rozann Lesches, MD  06/27/2022, 11:45 AM

## 2022-06-28 DIAGNOSIS — I7121 Aneurysm of the ascending aorta, without rupture: Secondary | ICD-10-CM | POA: Diagnosis not present

## 2022-06-28 DIAGNOSIS — I5020 Unspecified systolic (congestive) heart failure: Secondary | ICD-10-CM | POA: Diagnosis not present

## 2022-06-28 DIAGNOSIS — I25119 Atherosclerotic heart disease of native coronary artery with unspecified angina pectoris: Secondary | ICD-10-CM | POA: Diagnosis not present

## 2022-06-28 LAB — BASIC METABOLIC PANEL
Anion gap: 5 (ref 5–15)
BUN: 23 mg/dL (ref 8–23)
CO2: 24 mmol/L (ref 22–32)
Calcium: 8.8 mg/dL — ABNORMAL LOW (ref 8.9–10.3)
Chloride: 109 mmol/L (ref 98–111)
Creatinine, Ser: 0.94 mg/dL (ref 0.61–1.24)
GFR, Estimated: >60 mL/min (ref >60)
Glucose, Bld: 101 mg/dL — ABNORMAL HIGH (ref 70–99)
Potassium: 4.3 mmol/L (ref 3.5–5.1)
Sodium: 138 mmol/L (ref 135–145)

## 2022-06-28 LAB — CBC
HCT: 38.4 % — ABNORMAL LOW (ref 39.0–52.0)
Hemoglobin: 13.9 g/dL (ref 13.0–17.0)
MCH: 36.2 pg — ABNORMAL HIGH (ref 26.0–34.0)
MCHC: 36.2 g/dL — ABNORMAL HIGH (ref 30.0–36.0)
MCV: 100 fL (ref 80.0–100.0)
Platelets: 180 10*3/uL (ref 150–400)
RBC: 3.84 MIL/uL — ABNORMAL LOW (ref 4.22–5.81)
RDW: 12.6 % (ref 11.5–15.5)
WBC: 5.7 10*3/uL (ref 4.0–10.5)
nRBC: 0 % (ref 0.0–0.2)

## 2022-06-28 MED ORDER — BISACODYL 10 MG RE SUPP
10.0000 mg | Freq: Every day | RECTAL | Status: DC | PRN
  Administered 2022-06-28: 10 mg via RECTAL
  Filled 2022-06-28: qty 1

## 2022-06-28 MED ORDER — SODIUM CHLORIDE 0.9 % IV SOLN: 250.0000 mL | INTRAVENOUS | Status: DC | PRN

## 2022-06-28 MED ORDER — POLYETHYLENE GLYCOL 3350 17 G PO PACK
17.0000 g | PACK | Freq: Every day | ORAL | Status: DC
  Administered 2022-06-28 – 2022-06-30 (×2): 17 g via ORAL
  Filled 2022-06-28 (×2): qty 1

## 2022-06-28 MED ORDER — SODIUM CHLORIDE 0.9% FLUSH: 3.0000 mL | INTRAVENOUS | Status: DC | PRN

## 2022-06-28 MED ORDER — SODIUM CHLORIDE 0.9 % IV SOLN: INTRAVENOUS | Status: DC

## 2022-06-28 MED ORDER — HYDROCORTISONE 1 % EX CREA
TOPICAL_CREAM | CUTANEOUS | Status: DC | PRN
  Filled 2022-06-28: qty 28

## 2022-06-28 NOTE — Progress Notes (Signed)
Progress Note  Patient Name: Joe [name] Date of Encounter: 06/28/2022  Primary Cardiologist: Skeet Latch, MD  Subjective   Comfortable this morning.  Indicates that he did have some discomfort in his chest yesterday.  Not short of breath at rest.  Inpatient Medications    Scheduled Meds:  empagliflozin  10 mg Oral Daily   fluticasone  2 spray Each Nare Daily   heparin  5,000 Units Subcutaneous Q8H   LORazepam  1 mg Oral QHS   sacubitril-valsartan  1 tablet Oral BID   sodium chloride flush  3 mL Intravenous Q12H   tamsulosin  0.4 mg Oral BID    PRN Meds: acetaminophen, alum & mag hydroxide-simeth, furosemide, ondansetron (ZOFRAN) IV, senna-docusate   Vital Signs    Vitals:   06/27/22 1643 06/27/22 1937 06/28/22 0815 06/28/22 0926  BP: (!) 92/46 (!) 134/50  (!) 109/57  Pulse: 66 (!) 55 60 66  Resp: '20 18 20 '$ (!) 23  Temp: 97.7 F (36.5 C) 98.1 F (36.7 C)  97.7 F (36.5 C)  TempSrc: Oral Oral  Oral  SpO2: 96% 90% 100% 97%  Weight:      Height:        Intake/Output Summary (Last 24 hours) at 06/28/2022 0955 Last data filed at 06/28/2022 0815 Gross per 24 hour  Intake 360 ml  Output 2290 ml  Net -1930 ml   Filed Weights   06/25/22 1821 06/26/22 0436  Weight: 75.1 kg 73.6 kg    Telemetry    Sinus rhythm with episode of NSVT.  Personally reviewed.  ECG    An ECG dated 06/25/2022 was personally reviewed today and demonstrated:  Sinus rhythm with prolonged PR interval, left bundle branch block, PVCs.  Physical Exam   GEN: No acute distress.   Neck: No JVD. Cardiac: RRR, 2/6 diastolic murmur, no gallop.  Respiratory: Nonlabored. Clear to auscultation bilaterally. GI: Soft, nontender, bowel sounds present. MS: No edema; No deformity. Neuro:  Nonfocal. Psych: Normal affect.  Labs    Chemistry Recent Labs  Lab 06/25/22 1829 06/26/22 0731 06/28/22 0132  NA 139 139 138  K 4.3 4.4 4.3  CL 106 110 109  CO2 24 20* 24  GLUCOSE 96 105* 101*   BUN '15 13 23  '$ CREATININE 0.83 0.84 0.94  CALCIUM 9.7 9.3 8.8*  PROT 6.6  --   --   ALBUMIN 4.0  --   --   AST 21  --   --   ALT 21  --   --   ALKPHOS 64  --   --   BILITOT 1.2  --   --   GFRNONAA >60 >60 >60  ANIONGAP '9 9 5     '$ Hematology Recent Labs  Lab 06/25/22 1829 06/26/22 0731 06/28/22 0132  WBC 7.2 5.3 5.7  RBC 4.12* 4.10* 3.84*  HGB 14.9 14.7 13.9  HCT 41.4 41.7 38.4*  MCV 100.5* 101.7* 100.0  MCH 36.2* 35.9* 36.2*  MCHC 36.0 35.3 36.2*  RDW 12.7 12.8 12.6  PLT 218 195 180    BNP Recent Labs  Lab 06/25/22 1829  BNP 436.2*     Radiology    CT ANGIO CHEST AORTA W/ & OR WO/CM & GATING ([place] ONLY)  Result Date: 06/26/2022 EXAM: OVER-READ INTERPRETATION  CT CHEST The following report is a limited chest CT over-read performed by radiologist Dr. Markus Daft of [facility] on 06/26/2022. This over-read does not include interpretation of cardiac or coronary anatomy  or pathology. The coronary CTA interpretation by the cardiologist is attached. COMPARISON:  None Available. FINDINGS: Vascular: Enlargement of the ascending thoracic aorta and aortic root. Cardiac enlargement. Refer to the separate cardiologist report. Mediastinum/Nodes: Visualized mediastinal structures are normal. Lungs/Pleura: Volume loss in the bilateral lower lobes. Probable 3 mm calcified granuloma in the periphery of the left upper lobe on sequence 11 image 7. No large areas of consolidation or airspace disease in the visualized lungs. No significant pleural effusions. Upper Abdomen: Images of the upper abdomen are unremarkable. Musculoskeletal: No acute bone abnormality. IMPRESSION: 1. No acute extracardiac findings. 2. Enlargement of the ascending thoracic aorta and aortic root. Refer to the cardiologist report. Electronically Signed   By: Markus Daft M.D.   On: 06/26/2022 16:25   CT CORONARY MORPH W/CTA COR W/SCORE W/CA W/CM &/OR WO/CM  Result Date: 06/26/2022 HISTORY: Chest pain,  nonspecific EXAM: Cardiac/Coronary  CT TECHNIQUE: The patient was scanned on a [employer]. PROTOCOL: A 120 kV prospective scan was triggered in the descending thoracic aorta at 111 HU's. Axial non-contrast 3 mm slices were carried out through the heart. The data set was analyzed on a dedicated work station and scored using the Agatston method. Gantry rotation speed was 250 msecs and collimation was .6 mm. Beta blockade and 0.8 mg of sl NTG was given. The 3D data set was reconstructed in 5% intervals of the 35-75 % of the R-R cycle. Systolic and diastolic phases were analyzed on a dedicated work station using MPR, MIP and VRT modes. The patient received contrast: 150m OMNIPAQUE IOHEXOL 350 MG/ML SOLN. FINDINGS: Image quality: Average Noise artifact is: Moderately reduced signal to noise, contrast to noise ratios. Cardiac motion artifact. AORTA: Severe dilation of the ascending aorta at the sinus of Valsalva. No dissection. No calcifications. Measurements made in double oblique technique: Sinus of Valsalva, measured sinus to sinus technique: R-L: 66 mm L-Non: 67 mm R-Non: 65 mm STJ: 46 mm Mid ascending aorta (at PA bifurcation): 44 mm Coronary calcium score is 693, which places the patient in the 82nd percentile for age and sex matched control. Coronary arteries: Normal coronary origins.  Right dominance. Right Coronary Artery: Mild mixed atherosclerotic plaque in the proximal RCA, 25-49% stenosis. Distal RCA is diffusely disease however due to image quality, luminal stenosis cannot be fully evaluated. Possible severe stenosis in the distal RCA, 70-99% stenosis. Which are patent Left Main Coronary Artery: Minimal mixed atherosclerotic plaque in the ostial left main, <25% stenosis. Left Anterior Descending Coronary Artery: Probable severe mixed atherosclerotic plaque in the mid LAD, 70-99% stenosis. Moderate calcified plaque in the mid-distal LAD at the level of the bifurcation of the second diagonal,  50-69% stenosis. Left Circumflex Artery: No detectable plaque or stenosis. Small OM1 and OM2, with large OM3 and diminutive distal circumflex artery. Aortic Valve: Trivial calcifications. AV calcium score 99. Other findings: Normal pulmonary vein drainage into the left atrium. Normal left atrial appendage without thrombus. Moderate-severe dilation of main pulmonary artery, 39 mm. IMPRESSION: Motion and noise artifact, as well as reduced contrast density degrade the diagnostic quality of this exam. 1. Ascending aortic aneurysm with severe dilation at the sinus of Valsalva, 67 mm. Moderate dilation of mid ascending aorta, 44 mm. 2. Probable severe CAD in mid LAD and distal RCA, 70-99% stenosis, CADRADS 4. FFR could not be fully processed due to image quality. 3. Coronary calcium score is 693, which places the patient in the 82nd percentile for age and sex matched control. 4. Normal  coronary origins with right dominance. 5. Moderate-severe dilation of main pulmonary artery, 39 mm, suggests pulmonary hypertension. Alternatively in setting of dilated aorta, may indicate vascular pathology. RECOMMENDATIONS: CAD-RADS 4 Severe stenosis. (70-99% or > 50% left main). Cardiac catheterization or CT FFR is recommended. Consider symptom-guided anti-ischemic pharmacotherapy as well as risk factor modification per guideline directed care. Electronically Signed   By: Cherlynn Kaiser M.D.   On: 06/26/2022 16:06    Cardiac Studies   Echocardiogram 06/25/2022:  1. Left ventricular ejection fraction, by estimation, is 25 to 30%. The  left ventricle has severely decreased function. The left ventricle  demonstrates global hypokinesis. The left ventricular internal cavity size  was severely dilated. There is moderate   concentric left ventricular hypertrophy. Left ventricular diastolic  parameters are consistent with Grade I diastolic dysfunction (impaired  relaxation). The average left ventricular global longitudinal strain is   -7.0 %. The global longitudinal strain is  abnormal. No LV thrombus.   2. Right ventricular systolic function is normal. The right ventricular  size is normal. There is normal pulmonary artery systolic pressure.   3. Left atrial size was severely dilated.   4. Right atrial size was mildly dilated.   5. The mitral valve is normal in structure. Mild mitral valve  regurgitation. No evidence of mitral stenosis.   6. The aortic valve is tricuspid. Aortic valve regurgitation is severe.  There is diatsolic flow reversal (aortic arch asssessment). Eccentric jet  related to root and Sinus of Valsalva dialtiont. Vena contracta 0.8 mm.   7. Pulmonic valve regurgitation is moderate.   8. Aortic dilatation noted. There is severe dilatation of the aortic  root, measuring 65 mm. There is moderate dilatation of the ascending  aorta, measuring 45 mm.   9. The inferior vena cava is normal in size with greater than 50%  respiratory variability, suggesting right atrial pressure of 3 mmHg.  10. Cannot exclude a small PFO.   Assessment & Plan    1.  Large ascending aortic aneurysm with severe dilatation of the sinus of Valsalva measuring 67 mm, moderately dilated mid ascending aorta at 44 mm.  2.  HFrEF with LVEF 25 to 30% and global hypokinesis by recent echocardiogram.  Duration uncertain.  3.  Severe aortic regurgitation by recent echocardiogram likely in association with problem #1.  Could also be contributing to cardiomyopathy.  Started on [place].  4.  CAD by coronary CTA with suspected severe mid LAD stenosis and distal RCA stenosis.  Calcium score 693.  Scheduled for cardiac catheterization tomorrow.  5.  Significantly dilated main pulmonary artery at 39 mm.  Pulmonic regurgitation was moderate by recent echocardiogram but estimated RVSP normal range.  This needs to be clarified in terms of potential pulmonary hypertension.  TCTS consultation noted.  Plan is for right and left heart  catheterization tomorrow.  Continue Jardiance and Entresto.  Theoretically beta-blocker would be avoided in the setting of severe aortic regurgitation.  Further adjustments in GDMT pending hemodynamics at cardiac catheterization tomorrow.  Signed, Rozann Lesches, MD  06/28/2022, 9:55 AM

## 2022-06-28 NOTE — Progress Notes (Signed)
Mobility Specialist Progress Note:   06/28/22 1018  Mobility  Activity Ambulated with assistance in hallway  Level of Assistance Standby assist, set-up cues, supervision of patient - no hands on  Assistive Device None  Distance Ambulated (ft) 230 ft  Activity Response Tolerated well  $Mobility charge 1 Mobility   Pt received in bed willing to participate in mobility. No complaints of pain. Left in chair with call bell in reach and all needs met.   [facility] Surveyor, mining Chat only

## 2022-06-29 ENCOUNTER — Encounter (HOSPITAL_COMMUNITY)
Admission: RE | Disposition: A | Discharge status: Home / Self Care | Payer: Self-pay | Source: Home / Self Care | Attending: Cardiovascular Disease

## 2022-06-29 DIAGNOSIS — I712 Thoracic aortic aneurysm, without rupture, unspecified: Secondary | ICD-10-CM | POA: Diagnosis not present

## 2022-06-29 DIAGNOSIS — I5021 Acute systolic (congestive) heart failure: Secondary | ICD-10-CM

## 2022-06-29 DIAGNOSIS — I351 Nonrheumatic aortic (valve) insufficiency: Secondary | ICD-10-CM | POA: Diagnosis not present

## 2022-06-29 DIAGNOSIS — I2510 Atherosclerotic heart disease of native coronary artery without angina pectoris: Secondary | ICD-10-CM | POA: Diagnosis not present

## 2022-06-29 DIAGNOSIS — I5042 Chronic combined systolic (congestive) and diastolic (congestive) heart failure: Secondary | ICD-10-CM

## 2022-06-29 HISTORY — PX: RIGHT HEART CATH AND CORONARY ANGIOGRAPHY: CATH118264

## 2022-06-29 LAB — POCT I-STAT EG7
Acid-base deficit: 2 mmol/L (ref 0.0–2.0)
Bicarbonate: 23.1 mmol/L (ref 20.0–28.0)
Calcium, Ion: 1.27 mmol/L (ref 1.15–1.40)
HCT: 45 % (ref 39.0–52.0)
Hemoglobin: 15.3 g/dL (ref 13.0–17.0)
O2 Saturation: 66 %
Potassium: 4.4 mmol/L (ref 3.5–5.1)
Sodium: 139 mmol/L (ref 135–145)
TCO2: 24 mmol/L (ref 22–32)
pCO2, Ven: 39.7 mmHg — ABNORMAL LOW (ref 44–60)
pH, Ven: 7.372 (ref 7.25–7.43)
pO2, Ven: 35 mmHg (ref 32–45)

## 2022-06-29 LAB — BASIC METABOLIC PANEL
Anion gap: 7 (ref 5–15)
BUN: 20 mg/dL (ref 8–23)
CO2: 25 mmol/L (ref 22–32)
Calcium: 8.9 mg/dL (ref 8.9–10.3)
Chloride: 109 mmol/L (ref 98–111)
Creatinine, Ser: 1.09 mg/dL (ref 0.61–1.24)
GFR, Estimated: >60 mL/min (ref >60)
Glucose, Bld: 96 mg/dL (ref 70–99)
Potassium: 4.4 mmol/L (ref 3.5–5.1)
Sodium: 141 mmol/L (ref 135–145)

## 2022-06-29 LAB — POCT I-STAT 7, (LYTES, BLD GAS, ICA,H+H)
Acid-base deficit: 3 mmol/L — ABNORMAL HIGH (ref 0.0–2.0)
Bicarbonate: 21.1 mmol/L (ref 20.0–28.0)
Calcium, Ion: 1.24 mmol/L (ref 1.15–1.40)
HCT: 46 % (ref 39.0–52.0)
Hemoglobin: 15.6 g/dL (ref 13.0–17.0)
O2 Saturation: 93 %
Potassium: 4.3 mmol/L (ref 3.5–5.1)
Sodium: 139 mmol/L (ref 135–145)
TCO2: 22 mmol/L (ref 22–32)
pCO2 arterial: 35 mmHg (ref 32–48)
pH, Arterial: 7.389 (ref 7.35–7.45)
pO2, Arterial: 67 mmHg — ABNORMAL LOW (ref 83–108)

## 2022-06-29 LAB — CBC
HCT: 39.4 % (ref 39.0–52.0)
Hemoglobin: 14.3 g/dL (ref 13.0–17.0)
MCH: 36.9 pg — ABNORMAL HIGH (ref 26.0–34.0)
MCHC: 36.3 g/dL — ABNORMAL HIGH (ref 30.0–36.0)
MCV: 101.5 fL — ABNORMAL HIGH (ref 80.0–100.0)
Platelets: 187 10*3/uL (ref 150–400)
RBC: 3.88 MIL/uL — ABNORMAL LOW (ref 4.22–5.81)
RDW: 12.9 % (ref 11.5–15.5)
WBC: 4.9 10*3/uL (ref 4.0–10.5)
nRBC: 0 % (ref 0.0–0.2)

## 2022-06-29 SURGERY — RIGHT HEART CATH AND CORONARY ANGIOGRAPHY
Anesthesia: LOCAL

## 2022-06-29 MED ORDER — HEPARIN SODIUM (PORCINE) 5000 UNIT/ML IJ SOLN
5000.0000 [IU] | Freq: Three times a day (TID) | INTRAMUSCULAR | Status: DC
  Administered 2022-06-29 – 2022-06-30 (×2): 5000 [IU] via SUBCUTANEOUS
  Filled 2022-06-29 (×2): qty 1

## 2022-06-29 MED ORDER — MIDAZOLAM HCL 2 MG/2ML IJ SOLN
INTRAMUSCULAR | Status: AC
  Filled 2022-06-29: qty 2

## 2022-06-29 MED ORDER — VERAPAMIL HCL 2.5 MG/ML IV SOLN
INTRAVENOUS | Status: AC
  Filled 2022-06-29: qty 2

## 2022-06-29 MED ORDER — MIDAZOLAM HCL 2 MG/2ML IJ SOLN
INTRAMUSCULAR | Status: DC | PRN
  Administered 2022-06-29: 1 mg via INTRAVENOUS

## 2022-06-29 MED ORDER — LIDOCAINE HCL (PF) 1 % IJ SOLN
INTRAMUSCULAR | Status: AC
  Filled 2022-06-29: qty 30

## 2022-06-29 MED ORDER — LABETALOL HCL 5 MG/ML IV SOLN: 10.0000 mg | INTRAVENOUS | Status: AC | PRN

## 2022-06-29 MED ORDER — ASPIRIN 81 MG PO CHEW
81.0000 mg | CHEWABLE_TABLET | Freq: Once | ORAL | Status: AC
  Administered 2022-06-29: 81 mg via ORAL
  Filled 2022-06-29: qty 1

## 2022-06-29 MED ORDER — HEPARIN SODIUM (PORCINE) 1000 UNIT/ML IJ SOLN
INTRAMUSCULAR | Status: AC
  Filled 2022-06-29: qty 10

## 2022-06-29 MED ORDER — SODIUM CHLORIDE 0.9% FLUSH: 3.0000 mL | Freq: Two times a day (BID) | INTRAVENOUS | Status: DC

## 2022-06-29 MED ORDER — SODIUM CHLORIDE 0.9% FLUSH: 3.0000 mL | INTRAVENOUS | Status: DC | PRN

## 2022-06-29 MED ORDER — VERAPAMIL HCL 2.5 MG/ML IV SOLN
INTRAVENOUS | Status: DC | PRN
  Administered 2022-06-29: 10 mL via INTRA_ARTERIAL

## 2022-06-29 MED ORDER — FENTANYL CITRATE (PF) 100 MCG/2ML IJ SOLN
INTRAMUSCULAR | Status: AC
  Filled 2022-06-29: qty 2

## 2022-06-29 MED ORDER — SODIUM CHLORIDE 0.9 % IV SOLN: INTRAVENOUS | Status: AC

## 2022-06-29 MED ORDER — LIDOCAINE HCL (PF) 1 % IJ SOLN
INTRAMUSCULAR | Status: DC | PRN
  Administered 2022-06-29: 5 mL

## 2022-06-29 MED ORDER — SODIUM CHLORIDE 0.9 % IV SOLN: 250.0000 mL | INTRAVENOUS | Status: DC | PRN

## 2022-06-29 MED ORDER — HEPARIN (PORCINE) IN NACL 1000-0.9 UT/500ML-% IV SOLN
INTRAVENOUS | Status: DC | PRN
  Administered 2022-06-29 (×2): 500 mL

## 2022-06-29 MED ORDER — FENTANYL CITRATE (PF) 100 MCG/2ML IJ SOLN
INTRAMUSCULAR | Status: DC | PRN
  Administered 2022-06-29: 25 ug via INTRAVENOUS

## 2022-06-29 MED ORDER — HEPARIN SODIUM (PORCINE) 1000 UNIT/ML IJ SOLN
INTRAMUSCULAR | Status: DC | PRN
  Administered 2022-06-29: 5000 [IU] via INTRAVENOUS

## 2022-06-29 MED ORDER — SODIUM CHLORIDE 0.9 % IV SOLN
INTRAVENOUS | Status: AC | PRN
  Administered 2022-06-29: 10 mL/h via INTRAVENOUS

## 2022-06-29 MED ORDER — HYDRALAZINE HCL 20 MG/ML IJ SOLN: 10.0000 mg | INTRAMUSCULAR | Status: AC | PRN

## 2022-06-29 SURGICAL SUPPLY — 15 items
BAND ZEPHYR COMPRESS 30 LONG (HEMOSTASIS) IMPLANT
CATH 5FR PIGTAIL DIAGNOSTIC (CATHETERS) IMPLANT
CATH BALLN WEDGE 5F 110CM (CATHETERS) IMPLANT
CATH INFINITI 5 FR JL6.0 (CATHETERS) IMPLANT
CATH INFINITI 5FR JL5 (CATHETERS) IMPLANT
CATH INFINITI JR4 5F (CATHETERS) IMPLANT
GLIDESHEATH SLEND SS 6F .021 (SHEATH) IMPLANT
GUIDEWIRE .025 260CM (WIRE) IMPLANT
GUIDEWIRE INQWIRE 1.5J.035X260 (WIRE) IMPLANT
INQWIRE 1.5J .035X260CM (WIRE) ×1
KIT HEART LEFT (KITS) ×1 IMPLANT
PACK CARDIAC CATHETERIZATION (CUSTOM PROCEDURE TRAY) ×1 IMPLANT
SHEATH GLIDE SLENDER 4/5FR (SHEATH) IMPLANT
TRANSDUCER W/STOPCOCK (MISCELLANEOUS) ×1 IMPLANT
TUBING CIL FLEX 10 FLL-RA (TUBING) ×1 IMPLANT

## 2022-06-29 NOTE — H&P (View-Only) (Signed)
Progress Note  Patient Name: Joe [name] Date of Encounter: 06/29/2022  Primary Cardiologist: Skeet Latch, MD  Subjective   No new events overnight. Plan for [facility] today with Dr. Burt Knack. TCTS has evaluated for surgery. He was net negative 2L overnight- overall 5.7L negative.  Inpatient Medications    Scheduled Meds:  empagliflozin  10 mg Oral Daily   fluticasone  2 spray Each Nare Daily   heparin  5,000 Units Subcutaneous Q8H   LORazepam  1 mg Oral QHS   polyethylene glycol  17 g Oral Daily   sacubitril-valsartan  1 tablet Oral BID   sodium chloride flush  3 mL Intravenous Q12H   tamsulosin  0.4 mg Oral BID    sodium chloride     sodium chloride     PRN Meds: sodium chloride, acetaminophen, alum & mag hydroxide-simeth, bisacodyl, furosemide, hydrocortisone cream, ondansetron (ZOFRAN) IV, senna-docusate, sodium chloride flush   Vital Signs    Vitals:   06/28/22 2029 06/28/22 2330 06/29/22 0525 06/29/22 0815  BP: (!) 121/48 116/60 (!) 122/55 (!) 113/52  Pulse: 63 (!) 59 77 64  Resp: 20 19 (!) 24 17  Temp: 97.9 F (36.6 C) 98 F (36.7 C) 97.7 F (36.5 C) 97.9 F (36.6 C)  TempSrc: Oral Oral Oral Oral  SpO2: 94% 97% 92% 97%  Weight:   73.8 kg   Height:        Intake/Output Summary (Last 24 hours) at 06/29/2022 0919 Last data filed at 06/29/2022 0548 Gross per 24 hour  Intake 120 ml  Output 1650 ml  Net -1530 ml   Filed Weights   06/25/22 1821 06/26/22 0436 06/29/22 0525  Weight: 75.1 kg 73.6 kg 73.8 kg    Telemetry    Sinus rhythm with frequent PAC's and PVC's - Personally reviewed.  ECG    N/A  Physical Exam   GEN: No acute distress.   Neck: No JVD. Cardiac: RRR, 2/6 diastolic murmur, no gallop.  Respiratory: Nonlabored. Clear to auscultation bilaterally. GI: Soft, nontender, bowel sounds present. MS: No edema; No deformity. Neuro:  Nonfocal. Psych: Normal affect.  Labs    Chemistry Recent Labs  Lab 06/25/22 1829  06/26/22 0731 06/28/22 0132 06/29/22 0259  NA 139 139 138 141  K 4.3 4.4 4.3 4.4  CL 106 110 109 109  CO2 24 20* 24 25  GLUCOSE 96 105* 101* 96  BUN '15 13 23 20  '$ CREATININE 0.83 0.84 0.94 1.09  CALCIUM 9.7 9.3 8.8* 8.9  PROT 6.6  --   --   --   ALBUMIN 4.0  --   --   --   AST 21  --   --   --   ALT 21  --   --   --   ALKPHOS 64  --   --   --   BILITOT 1.2  --   --   --   GFRNONAA >60 >60 >60 >60  ANIONGAP '9 9 5 7     '$ Hematology Recent Labs  Lab 06/26/22 0731 06/28/22 0132 06/29/22 0259  WBC 5.3 5.7 4.9  RBC 4.10* 3.84* 3.88*  HGB 14.7 13.9 14.3  HCT 41.7 38.4* 39.4  MCV 101.7* 100.0 101.5*  MCH 35.9* 36.2* 36.9*  MCHC 35.3 36.2* 36.3*  RDW 12.8 12.6 12.9  PLT 195 180 187    BNP Recent Labs  Lab 06/25/22 1829  BNP 436.2*     Radiology    No results found.  Cardiac  Studies   Echocardiogram 06/25/2022:  1. Left ventricular ejection fraction, by estimation, is 25 to 30%. The  left ventricle has severely decreased function. The left ventricle  demonstrates global hypokinesis. The left ventricular internal cavity size  was severely dilated. There is moderate   concentric left ventricular hypertrophy. Left ventricular diastolic  parameters are consistent with Grade I diastolic dysfunction (impaired  relaxation). The average left ventricular global longitudinal strain is  -7.0 %. The global longitudinal strain is  abnormal. No LV thrombus.   2. Right ventricular systolic function is normal. The right ventricular  size is normal. There is normal pulmonary artery systolic pressure.   3. Left atrial size was severely dilated.   4. Right atrial size was mildly dilated.   5. The mitral valve is normal in structure. Mild mitral valve  regurgitation. No evidence of mitral stenosis.   6. The aortic valve is tricuspid. Aortic valve regurgitation is severe.  There is diatsolic flow reversal (aortic arch asssessment). Eccentric jet  related to root and Sinus of  Valsalva dialtiont. Vena contracta 0.8 mm.   7. Pulmonic valve regurgitation is moderate.   8. Aortic dilatation noted. There is severe dilatation of the aortic  root, measuring 65 mm. There is moderate dilatation of the ascending  aorta, measuring 45 mm.   9. The inferior vena cava is normal in size with greater than 50%  respiratory variability, suggesting right atrial pressure of 3 mmHg.  10. Cannot exclude a small PFO.   Assessment & Plan    1.  Large ascending aortic aneurysm with severe dilatation of the sinus of Valsalva measuring 67 mm, moderately dilated mid ascending aorta at 44 mm. Evaluated by CT surgery with plans for operative repair.  2.  HFrEF with LVEF 25 to 30% and global hypokinesis by recent echocardiogram.  Duration uncertain. On Jardiance and Entresto - good diuresis with this. Lasix was ordered as needed - will hold and evaluate need for standing lasix after cath.  Recommend adding low dose BB when clinically compensated, probably after surgery.  3.  Severe aortic regurgitation by recent echocardiogram likely in association with problem #1.  Could also be contributing to cardiomyopathy.  Started on [place].  4.  CAD by coronary CTA with suspected severe mid LAD stenosis and distal RCA stenosis.  Calcium score 693.  Scheduled for cardiac catheterization today  5.  Significantly dilated main pulmonary artery at 39 mm.  Pulmonic regurgitation was moderate by recent echocardiogram but estimated RVSP normal range.  This needs to be clarified in terms of potential pulmonary hypertension.  Pixie Casino, MD, [facility], [place] Director of the Advanced Lipid Disorders &  Cardiovascular Risk Reduction Clinic Diplomate of the American Board of Clinical Lipidology Attending Cardiologist  Direct Dial: [phone]  Fax: [phone]  Website:  [email or web address]  Pixie Casino, MD  06/29/2022, 9:19 AM

## 2022-06-29 NOTE — TOC Initial Note (Signed)
Transition of Care [facility]) - Initial/Assessment Note    Patient Details  Name: Joe [name] MRN: [id number] Date of Birth: 1956/08/30  [facility] CM/SW Contact:    Erenest Rasher, RN Phone Number: [phone] 06/29/2022, 11:03 AM  Clinical Narrative:                  HF TOC CM spoke to pt and mother at bedside. Pt was independent PTA. Scheduled for cardiac cath today. Expressed being nervous about procedure. Pt did not have any questions. Has scale at home. Explained HF will need to weigh daily and adhere to low sodium heart healthy diet. Will continue to follow for dc needs.    Expected Discharge Plan: Home/Self Care Barriers to Discharge: Continued Medical Work up   Patient Goals and CMS Choice Patient states their goals for this hospitalization and ongoing recovery are:: wants to get better      Expected Discharge Plan and Services Expected Discharge Plan: Home/Self Care   Discharge Planning Services: CM Consult   Living arrangements for the past 2 months: Single Family Home   Prior Living Arrangements/Services Living arrangements for the past 2 months: Single Family Home Lives with:: Parents Patient language and need for interpreter reviewed:: Yes Do you feel safe going back to the place where you live?: Yes      Need for Family Participation in Patient Care: No (Comment) Care giver support system in place?: Yes (comment) Current home services: DME (scale) Criminal Activity/Legal Involvement Pertinent to Current Situation/Hospitalization: No - Comment as needed  Activities of Daily Living      Permission Sought/Granted Permission sought to share information with : Case Manager, Family Supports, PCP Permission granted to share information with : Yes, Verbal Permission Granted  Share Information with NAME: Chalmers Guest     Permission granted to share info w Relationship: mother  Permission granted to share info w Contact Information:  [phone]  Emotional Assessment Appearance:: Appears stated age Attitude/Demeanor/Rapport: Engaged Affect (typically observed): Accepting Orientation: : Oriented to Self, Oriented to Place, Oriented to  Time, Oriented to Situation   Psych Involvement: No (comment)  Admission diagnosis:  Aortic aneurysm [facility]) [I71.9] Patient Active Problem List   Diagnosis Date Noted   Chronic combined systolic and diastolic heart failure ([place])    Aortic aneurysm ([place]) 06/25/2022   PVC (premature ventricular contraction) 05/26/2022   Lower extremity edema 05/26/2022   LBBB (left bundle branch block) 05/26/2022   Murmur 05/26/2022   Primary localized osteoarthrosis of left shoulder 09/21/2017   Primary localized osteoarthrosis of shoulder 09/21/2017   Osteoarthritis of right hip 05/15/2014   Hip arthritis 05/15/2014   Hyponatremia 01/18/2014   Osteoarthritis of left hip 01/16/2014   Abdominal distension 12/14/2012   Urinary retention 12/12/2012   Abnormal LFTs 12/12/2012   Mental status change 12/11/2012   Left leg pain 08/27/2011   PCP:  Willey Blade, MD Pharmacy:   [facility], [place] - [address] AT [address] [place] [address] Phone: [phone] Fax: [phone]     Social Determinants of Health (SDOH) Interventions    Readmission Risk Interventions     No data to display

## 2022-06-29 NOTE — Care Management Important Message (Signed)
Important Message  Patient Details  Name: Joe [name] MRN: [id number] Date of Birth: [birth year]   Medicare Important Message Given:  Yes     [name] 06/29/2022, 8:57 AM

## 2022-06-29 NOTE — Progress Notes (Signed)
Progress Note  Patient Name: Joe [name] Date of Encounter: 06/29/2022  Primary Cardiologist: Skeet Latch, MD  Subjective   No new events overnight. Plan for [facility] today with Dr. Burt Knack. TCTS has evaluated for surgery. He was net negative 2L overnight- overall 5.7L negative.  Inpatient Medications    Scheduled Meds:  empagliflozin  10 mg Oral Daily   fluticasone  2 spray Each Nare Daily   heparin  5,000 Units Subcutaneous Q8H   LORazepam  1 mg Oral QHS   polyethylene glycol  17 g Oral Daily   sacubitril-valsartan  1 tablet Oral BID   sodium chloride flush  3 mL Intravenous Q12H   tamsulosin  0.4 mg Oral BID    sodium chloride     sodium chloride     PRN Meds: sodium chloride, acetaminophen, alum & mag hydroxide-simeth, bisacodyl, furosemide, hydrocortisone cream, ondansetron (ZOFRAN) IV, senna-docusate, sodium chloride flush   Vital Signs    Vitals:   06/28/22 2029 06/28/22 2330 06/29/22 0525 06/29/22 0815  BP: (!) 121/48 116/60 (!) 122/55 (!) 113/52  Pulse: 63 (!) 59 77 64  Resp: 20 19 (!) 24 17  Temp: 97.9 F (36.6 C) 98 F (36.7 C) 97.7 F (36.5 C) 97.9 F (36.6 C)  TempSrc: Oral Oral Oral Oral  SpO2: 94% 97% 92% 97%  Weight:   73.8 kg   Height:        Intake/Output Summary (Last 24 hours) at 06/29/2022 0919 Last data filed at 06/29/2022 0548 Gross per 24 hour  Intake 120 ml  Output 1650 ml  Net -1530 ml   Filed Weights   06/25/22 1821 06/26/22 0436 06/29/22 0525  Weight: 75.1 kg 73.6 kg 73.8 kg    Telemetry    Sinus rhythm with frequent PAC's and PVC's - Personally reviewed.  ECG    N/A  Physical Exam   GEN: No acute distress.   Neck: No JVD. Cardiac: RRR, 2/6 diastolic murmur, no gallop.  Respiratory: Nonlabored. Clear to auscultation bilaterally. GI: Soft, nontender, bowel sounds present. MS: No edema; No deformity. Neuro:  Nonfocal. Psych: Normal affect.  Labs    Chemistry Recent Labs  Lab 06/25/22 1829  06/26/22 0731 06/28/22 0132 06/29/22 0259  NA 139 139 138 141  K 4.3 4.4 4.3 4.4  CL 106 110 109 109  CO2 24 20* 24 25  GLUCOSE 96 105* 101* 96  BUN '15 13 23 20  '$ CREATININE 0.83 0.84 0.94 1.09  CALCIUM 9.7 9.3 8.8* 8.9  PROT 6.6  --   --   --   ALBUMIN 4.0  --   --   --   AST 21  --   --   --   ALT 21  --   --   --   ALKPHOS 64  --   --   --   BILITOT 1.2  --   --   --   GFRNONAA >60 >60 >60 >60  ANIONGAP '9 9 5 7     '$ Hematology Recent Labs  Lab 06/26/22 0731 06/28/22 0132 06/29/22 0259  WBC 5.3 5.7 4.9  RBC 4.10* 3.84* 3.88*  HGB 14.7 13.9 14.3  HCT 41.7 38.4* 39.4  MCV 101.7* 100.0 101.5*  MCH 35.9* 36.2* 36.9*  MCHC 35.3 36.2* 36.3*  RDW 12.8 12.6 12.9  PLT 195 180 187    BNP Recent Labs  Lab 06/25/22 1829  BNP 436.2*     Radiology    No results found.  Cardiac  Studies   Echocardiogram 06/25/2022:  1. Left ventricular ejection fraction, by estimation, is 25 to 30%. The  left ventricle has severely decreased function. The left ventricle  demonstrates global hypokinesis. The left ventricular internal cavity size  was severely dilated. There is moderate   concentric left ventricular hypertrophy. Left ventricular diastolic  parameters are consistent with Grade I diastolic dysfunction (impaired  relaxation). The average left ventricular global longitudinal strain is  -7.0 %. The global longitudinal strain is  abnormal. No LV thrombus.   2. Right ventricular systolic function is normal. The right ventricular  size is normal. There is normal pulmonary artery systolic pressure.   3. Left atrial size was severely dilated.   4. Right atrial size was mildly dilated.   5. The mitral valve is normal in structure. Mild mitral valve  regurgitation. No evidence of mitral stenosis.   6. The aortic valve is tricuspid. Aortic valve regurgitation is severe.  There is diatsolic flow reversal (aortic arch asssessment). Eccentric jet  related to root and Sinus of  Valsalva dialtiont. Vena contracta 0.8 mm.   7. Pulmonic valve regurgitation is moderate.   8. Aortic dilatation noted. There is severe dilatation of the aortic  root, measuring 65 mm. There is moderate dilatation of the ascending  aorta, measuring 45 mm.   9. The inferior vena cava is normal in size with greater than 50%  respiratory variability, suggesting right atrial pressure of 3 mmHg.  10. Cannot exclude a small PFO.   Assessment & Plan    1.  Large ascending aortic aneurysm with severe dilatation of the sinus of Valsalva measuring 67 mm, moderately dilated mid ascending aorta at 44 mm. Evaluated by CT surgery with plans for operative repair.  2.  HFrEF with LVEF 25 to 30% and global hypokinesis by recent echocardiogram.  Duration uncertain. On Jardiance and Entresto - good diuresis with this. Lasix was ordered as needed - will hold and evaluate need for standing lasix after cath.  Recommend adding low dose BB when clinically compensated, probably after surgery.  3.  Severe aortic regurgitation by recent echocardiogram likely in association with problem #1.  Could also be contributing to cardiomyopathy.  Started on [place].  4.  CAD by coronary CTA with suspected severe mid LAD stenosis and distal RCA stenosis.  Calcium score 693.  Scheduled for cardiac catheterization today  5.  Significantly dilated main pulmonary artery at 39 mm.  Pulmonic regurgitation was moderate by recent echocardiogram but estimated RVSP normal range.  This needs to be clarified in terms of potential pulmonary hypertension.  Pixie Casino, MD, [facility], [place] Director of the Advanced Lipid Disorders &  Cardiovascular Risk Reduction Clinic Diplomate of the American Board of Clinical Lipidology Attending Cardiologist  Direct Dial: [phone]  Fax: [phone]  Website:  [email or web address]  Pixie Casino, MD  06/29/2022, 9:19 AM

## 2022-06-29 NOTE — Consult Note (Addendum)
Advanced Heart Failure Team Consult Note   Primary Physician: Willey Blade, MD PCP-Cardiologist:  Skeet Latch, MD  Reason for Consultation: Acute systolic CHF  HPI:    Joe [name] is seen today for evaluation of acute systolic CHF at the request of Dr. Lavonna Monarch with TCTS. 66 y.o. male with history of anxiety and mild cognitive impairment.   Recently established with Dr. Oval Linsey 05/26/22 for abnormal ECG - notable LBBB and first degree heart block. He was exercising regularly but did have evidence of decompensated CHF on exam. New murmur heard on exam. Echo 06/25/22: EF 25-30%, severely dilated LV, moderate LVH, RV okay, severe LAE, severe tricuspid valve AI, aortic root severely dilated measuring 65 mm and ascending aorta measures 45 mm. He was called by Cardiology for direct admission to hospital for further workup after echo results reviewed.   Coronary CTA 06/26/22: Ascending aorta measures 67 mm at sinus of Valsava, ascending aorta measures 44 mm, calcium score 693 with concern for 70-99% stenosis in m LAD and d RCA, moderate to severe dilatation main pulmonary artery measuring 39 mm.   TCTS has been consulted. Will likely require aortic root replacement and possible CABG pending R/LHC today. Advanced Heart Failure asked to assist with pre-op optimization in view of new severely reduced EF.  Pt feeling better today. Diuresed w/ IV Lasix, net negative 5.7L. Started on [place]. No current resting dyspnea. No CP. Awaiting cath. Mother present at bedside.     2D Echo 06/25/22  Left ventricular ejection fraction, by estimation, is 25 to 30%. The left ventricle has severely decreased function. The left ventricle demonstrates global hypokinesis. The left ventricular internal cavity size was severely dilated. There is moderate concentric left ventricular hypertrophy. Left ventricular diastolic parameters are consistent with Grade I diastolic dysfunction  (impaired relaxation). The average left ventricular global longitudinal strain is -7.0 %. The global longitudinal strain is abnormal. No LV thrombus. 1. Right ventricular systolic function is normal. The right ventricular size is normal. There is normal pulmonary artery systolic pressure. 2. 3. Left atrial size was severely dilated. 4. Right atrial size was mildly dilated. The mitral valve is normal in structure. Mild mitral valve regurgitation. No evidence of mitral stenosis. 5. The aortic valve is tricuspid. Aortic valve regurgitation is severe. There is diatsolic flow reversal (aortic arch asssessment). Eccentric jet related to root and Sinus of Valsalva dialtiont. Vena contracta 0.8 mm. 6. 7. Pulmonic valve regurgitation is moderate. Aortic dilatation noted. There is severe dilatation of the aortic root, measuring 65 mm. There is moderate dilatation of the ascending aorta, measuring 45 mm. 8. The inferior vena cava is normal in size with greater than 50% respiratory variability, suggesting right atrial pressure of 3 mmHg. 9. 10. Cannot exclude a small PFO.   Review of Systems: [y] = yes, '[ ]'$  = no   General: Weight gain '[ ]'$ ; Weight loss '[ ]'$ ; Anorexia '[ ]'$ ; Fatigue '[ ]'$ ; Fever '[ ]'$ ; Chills '[ ]'$ ; Weakness '[ ]'$   Cardiac: Chest pain/pressure '[ ]'$ ; Resting SOB '[ ]'$ ; Exertional SOB '[ ]'$ ; Orthopnea '[ ]'$ ; Pedal Edema '[ ]'$ ; Palpitations '[ ]'$ ; Syncope '[ ]'$ ; Presyncope '[ ]'$ ; Paroxysmal nocturnal dyspnea'[ ]'$   Pulmonary: Cough '[ ]'$ ; Wheezing'[ ]'$ ; Hemoptysis'[ ]'$ ; Sputum '[ ]'$ ; Snoring '[ ]'$   GI: Vomiting'[ ]'$ ; Dysphagia'[ ]'$ ; Melena'[ ]'$ ; Hematochezia '[ ]'$ ; Heartburn'[ ]'$ ; Abdominal pain '[ ]'$ ; Constipation '[ ]'$ ; Diarrhea '[ ]'$ ; BRBPR '[ ]'$   GU: Hematuria'[ ]'$ ; Dysuria '[ ]'$ ; Nocturia'[ ]'$   Vascular: Pain in legs with walking '[ ]'$ ; Pain in feet with lying flat '[ ]'$ ; Non-healing sores '[ ]'$ ; Stroke '[ ]'$ ; TIA '[ ]'$ ; Slurred speech '[ ]'$ ;  Neuro: Headaches'[ ]'$ ; Vertigo'[ ]'$ ; Seizures'[ ]'$ ; Paresthesias'[ ]'$ ;Blurred vision '[ ]'$ ; Diplopia '[ ]'$ ; Vision  changes '[ ]'$   Ortho/Skin: Arthritis '[ ]'$ ; Joint pain '[ ]'$ ; Muscle pain '[ ]'$ ; Joint swelling '[ ]'$ ; Back Pain '[ ]'$ ; Rash '[ ]'$   Psych: Depression'[ ]'$ ; Anxiety'[ ]'$   Heme: Bleeding problems '[ ]'$ ; Clotting disorders '[ ]'$ ; Anemia '[ ]'$   Endocrine: Diabetes '[ ]'$ ; Thyroid dysfunction'[ ]'$   Home Medications Prior to Admission medications   Medication Sig Start Date End Date Taking? Authorizing Provider  CALCIUM-VITAMIN D PO Take 2 tablets by mouth daily.   Yes [provider]  Cyanocobalamin (B-12) 5000 MCG CAPS Take 5,000 mcg by mouth daily.   Yes [provider]  fluticasone (FLONASE) 50 MCG/ACT nasal spray Place 2 sprays into both nostrils daily. 01/09/20  Yes Minette Brine, FNP  furosemide (LASIX) 20 MG tablet Take 1 tablet (20 mg total) by mouth as needed for edema. 05/26/22 08/24/22 Yes Skeet Latch, MD  L-Theanine 200 MG CAPS Take 200 mg by mouth at bedtime.   Yes [provider]  Multiple Vitamin (MULTIVITAMIN WITH MINERALS) TABS tablet Take 1 tablet by mouth daily.   Yes [provider]  tamsulosin (FLOMAX) 0.4 MG CAPS capsule Take 0.4 mg by mouth 2 (two) times daily. 04/11/22  Yes [provider]  UNABLE TO FIND Take 1 tablet by mouth daily. Med Name: Neuro optimizer   Yes [provider]  UNABLE TO FIND Take 1-2 tablets by mouth See admin instructions. Med Name: Neuro Zac: Take 2 tablets by mouth in morning and 1 tablet by mouth  in the afternoon   Yes [provider]  vitamin C (ASCORBIC ACID) 500 MG tablet Take 500 mg by mouth daily.   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   Murmur 05/26/2022   Osteoarthritis of left hip 01/16/2014   Osteoarthritis of right hip 05/15/2014   Primary localized osteoarthrosis of left shoulder 09/21/2017   PVC (premature ventricular contraction) 05/26/2022   Right ureteral stone    Speech impediment    Umbilical hernia     Past Surgical History: Past Surgical History:  Procedure Laterality Date   BACK SURGERY     CYSTOSCOPY W/ URETERAL STENT REMOVAL Right 04/19/2013   Procedure: CYSTOSCOPY WITH STENT REMOVAL;  Surgeon: Molli Hazard, MD;  Location: [facility];  Service: Urology;  Laterality: Right;   CYSTOSCOPY WITH RETROGRADE PYELOGRAM, URETEROSCOPY AND STENT PLACEMENT Right 04/19/2013   Procedure: CYSTOSCOPY WITH RETROGRADE PYELOGRAM, URETEROSCOPY ;  Surgeon: Molli Hazard, MD;  Location: [facility];  Service: Urology;  Laterality: Right;   CYSTOSCOPY WITH STENT PLACEMENT Right 03/24/2013   Procedure: CYSTOSCOPY WITH STENT PLACEMENT;  Surgeon: Hanley Ben, MD;  Location: [facility];  Service: Urology;  Laterality: Right;  HOLMIUM LASER APPLICATION Right 0/03/3015   Procedure: HOLMIUM LASER APPLICATION;  Surgeon: Molli Hazard, MD;  Location: [facility];  Service: Urology;  Laterality: Right;   INGUINAL HERNIA REPAIR Left 03-04-2005   POSTERIOR LUMBAR FUSION  08-27-2011   L4 -- L5   RIGHT SHOULDER HEMIARTHROPLASTY  08-26-2010   OA   SHOULDER HEMI-ARTHROPLASTY Left 09/21/2017   Procedure: SHOULDER HEMI-ARTHROPLASTY;  Surgeon: Marchia Bond, MD;  Location: [place];  Service: Orthopedics;  Laterality: Left;   TONSILLECTOMY  AS CHILD   TOTAL HIP ARTHROPLASTY Left 01/16/2014   Procedure: TOTAL HIP ARTHROPLASTY;  Surgeon: Johnny Bridge, MD;  Location: [place];  Service: Orthopedics;  Laterality: Left;   TOTAL HIP ARTHROPLASTY Right 05/15/2014   Procedure: RIGHT TOTAL HIP ARTHROPLASTY;  Surgeon: Johnny Bridge, MD;  Location: [place];   Service: Orthopedics;  Laterality: Right;   TOTAL SHOULDER ARTHROPLASTY Right 2011    Family History: Family History  Problem Relation Age of Onset   Heart failure Mother    Atrial fibrillation Mother    Heart failure Maternal Grandmother    Atrial fibrillation Maternal Grandmother     Social History: Social History   Socioeconomic History   Marital status: Single    Spouse name: Not on file   Number of children: Not on file   Years of education: Not on file   Highest education level: Not on file  Occupational History   Occupation: unemployed  Tobacco Use   Smoking status: Never   Smokeless tobacco: Never  Vaping Use   Vaping Use: Never used  Substance and Sexual Activity   Alcohol use: No   Drug use: No   Sexual activity: Not Currently  Other Topics Concern   Not on file  Social History Narrative   Pt with delayed mental capabilities, 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  Lives with parents.   Social Determinants of Health   Financial Resource Strain: Low Risk  (10/11/2019)   Overall Financial Resource Strain (CARDIA)    Difficulty of Paying Living Expenses: Not hard at all  Food Insecurity: No Food Insecurity (10/11/2019)   Hunger Vital Sign    Worried About Running Out of Food in the Last Year: Never true    Ran Out of Food in the Last Year: Never true  Transportation Needs: No Transportation Needs (10/11/2019)   PRAPARE - Hydrologist (Medical): No    Lack of Transportation (Non-Medical): No  Physical Activity: Sufficiently Active (10/11/2019)   Exercise Vital Sign    Days of Exercise per Week: 5 days    Minutes of Exercise per Session: 40 min  Stress: Stress Concern Present (10/11/2019)   [place]    Feeling of Stress : To some extent  Social Connections: Not on  file    Allergies:  Allergies  Allergen Reactions   Codeine Nausea Only   Levaquin [Levofloxacin In D5w] Diarrhea    Objective:    Vital Signs:   Temp:  [97.5 F (36.4 C)-98 F (36.7 C)] 97.9 F (36.6 C) (09/18 0815) Pulse Rate:  [59-77] 64 (09/18 0815) Resp:  [17-24] 17 (09/18 0815) BP: (113-136)/(48-60) 113/52 (09/18 0815) SpO2:  [92 %-97 %] 97 % (09/18 0815) Weight:  [73.8 kg] 73.8 kg (09/18 0525) Last BM Date : 06/28/22  Weight  change: Filed Weights   06/25/22 1821 06/26/22 0436 06/29/22 0525  Weight: 75.1 kg 73.6 kg 73.8 kg    Intake/Output:   Intake/Output Summary (Last 24 hours) at 06/29/2022 1004 Last data filed at 06/29/2022 0548 Gross per 24 hour  Intake 120 ml  Output 1650 ml  Net -1530 ml      Physical Exam    General:  Well appearing. No resp difficulty HEENT: normal Neck: supple. JVP . Carotids 2+ bilat; no bruits. No lymphadenopathy or thyromegaly appreciated. Cor: PMI nondisplaced. Regular rate & rhythm. 2/6 diastolic murmur  Lungs: clear Abdomen: soft, nontender, nondistended. No hepatosplenomegaly. No bruits or masses. Good bowel sounds. Extremities: no cyanosis, clubbing, rash, edema Neuro: alert & orientedx3, cranial nerves grossly intact. moves all 4 extremities w/o difficulty. Affect pleasant   Telemetry   NSR w/ PVCs, LBBB, HR upper 50s-60s, 7 beat run NSVT x 1.   EKG    SR w/ 1st degree AVB 65 bpm w/ PVCs, LBBB   Labs   Basic Metabolic Panel: Recent Labs  Lab 06/25/22 1829 06/26/22 0731 06/28/22 0132 06/29/22 0259  NA 139 139 138 141  K 4.3 4.4 4.3 4.4  CL 106 110 109 109  CO2 24 20* 24 25  GLUCOSE 96 105* 101* 96  BUN '15 13 23 20  '$ CREATININE 0.83 0.84 0.94 1.09  CALCIUM 9.7 9.3 8.8* 8.9  MG 2.0  --   --   --     Liver Function Tests: Recent Labs  Lab 06/25/22 1829  AST 21  ALT 21  ALKPHOS 64  BILITOT 1.2  PROT 6.6  ALBUMIN 4.0   No results for input(s): "LIPASE", "AMYLASE" in the last 168 hours. No  results for input(s): "AMMONIA" in the last 168 hours.  CBC: Recent Labs  Lab 06/25/22 1829 06/26/22 0731 06/28/22 0132 06/29/22 0259  WBC 7.2 5.3 5.7 4.9  NEUTROABS 4.6  --   --   --   HGB 14.9 14.7 13.9 14.3  HCT 41.4 41.7 38.4* 39.4  MCV 100.5* 101.7* 100.0 101.5*  PLT 218 195 180 187    Cardiac Enzymes: No results for input(s): "CKTOTAL", "CKMB", "CKMBINDEX", "TROPONINI" in the last 168 hours.  BNP: BNP (last 3 results) Recent Labs    06/25/22 1829  BNP 436.2*    ProBNP (last 3 results) No results for input(s): "PROBNP" in the last 8760 hours.   CBG: No results for input(s): "GLUCAP" in the last 168 hours.  Coagulation Studies: No results for input(s): "LABPROT", "INR" in the last 72 hours.   Imaging   No results found.   Medications:     Current Medications:  empagliflozin  10 mg Oral Daily   fluticasone  2 spray Each Nare Daily   heparin  5,000 Units Subcutaneous Q8H   LORazepam  1 mg Oral QHS   polyethylene glycol  17 g Oral Daily   sacubitril-valsartan  1 tablet Oral BID   sodium chloride flush  3 mL Intravenous Q12H   tamsulosin  0.4 mg Oral BID    Infusions:  sodium chloride     sodium chloride        Patient Profile   66 y.o. male with history of anxiety and mild cognitive impairment. Admitted with new systolic CHF/cardiomyopathy in setting of severe AI/aortic root aneurysm and possible obstructive CAD. CT surgery planning aortic root replacement and possible CABG pending R/LHC today. Advanced Heart Failure asked to assist with pre-op optimization in view of new severely reduced  EF.  Assessment/Plan   Ascending Aortic Aneurysm/ Severe AI  - CTA  w/ severe dilation at the sinus of Valsalva, 67 mm. Moderate dilation of mid ascending aorta, 44 mm. Tricuspid AoV - CT surgery planning aortic root/ valve replacement +/- CABG pending LHC results  - BP/HR controlled  2. Acute Systolic Heart Failure - newly diagnosed, in setting of severe  AI and likely MV CAD - Echo 9/23 EF 25-30%, LV severely dilated, severe AI, Global HK, RV ok  - Plan R/LHC today  - appears euvolemic on exam. Hold IV Lasix for now. RHC to guide need for further diuresis  - continue Entresto 24-26 mg bid - continue Jardiance 10 mg daily  - add spiro next  - w/ resting HR in the upper 50s, would hold off on addition of ? blocker - may need CRT-D in the future if EF does not improve w/ GDMT   3. CAD  - Coronary CTA suggestive of probable severe CAD in mid LAD and distal RCA, 70-99% stenosis. FFR could not be fully processed due to image quality - Plan LHC today  - suspect he will likely need CABG at time of aortic root/valve replacement  - should be on ASA and statin - check FLP in am to assess lipids   Length of Stay: [address], PA-C  06/29/2022, 10:04 AM  Advanced Heart Failure Team Pager [phone] (M-F; 7a - 5p)  Please contact [facility] for night-coverage after hours (4p -7a ) and weekends on [email or web address]   Patient seen and examined with the above-signed Advanced Practice Provider and/or Housestaff. I personally reviewed laboratory data, imaging studies and relevant notes. I independently examined the patient and formulated the important aspects of the plan. I have edited the note to reflect any of my changes or salient points. I have personally discussed the plan with the patient and/or family.  66 y/o male as above recently found to have severe LV dysfunction in setting of markedly dilated aortic root and severe AI (tricuspid AoV). CT suggests high-grade mid LAD stenosis. ECG with LBBB (144m) and frequent PVCs.   Denies CP. Breathing better after IV diuresis.   For cath today.   General:  Sitting up in bed No resp difficulty HEENT: normal Neck: supple. no JVD. Carotids 2+ bilat; no bruits. No lymphadenopathy or thryomegaly appreciated. Cor: PMI nondisplaced. Regular rate & rhythm. 2/6 AI Lungs: clear Abdomen: soft, nontender,  nondistended. No hepatosplenomegaly. No bruits or masses. Good bowel sounds. Extremities: no cyanosis, clubbing, rash, edema Neuro: alert & orientedx3, cranial nerves grossly intact. moves all 4 extremities w/o difficulty. Affect pleasant  Suspect LV dysfunction related primarily to AI but LBBB and PVCs may also be contributing.   Will need replacement of Aortic root +/- AVR and CABG. Would also consider epicardial lead placement at time for surgery for CRT.   Await cath results.   DGlori Bickers MD  2:42 PM  Addendum:  Cath results reviewed personally. Well compensated hemodynamics. 60-70% prox RCA lesion otherwise normal cors.   Seen at bedside with Dr. WLavonna Monarch Plan d/c tomorrow with re-admit next Wednesday for surgery. Including possible epicardial lead placement. EP notified and will see patient as well.   DGlori Bickers MD  4:51 PM

## 2022-06-29 NOTE — Interval H&P Note (Signed)
History and Physical Interval Note:  06/29/2022 2:27 PM  Joe [name]  has presented today for surgery, with the diagnosis of severe aortic regurg.  The various methods of treatment have been discussed with the patient and family. After consideration of risks, benefits and other options for treatment, the patient has consented to  Procedure(s): RIGHT/LEFT HEART CATH AND CORONARY ANGIOGRAPHY (N/A) as a surgical intervention.  The patient's history has been reviewed, patient examined, no change in status, stable for surgery.  I have reviewed the patient's chart and labs.  Questions were answered to the patient's satisfaction.     Sherren Mocha

## 2022-06-30 ENCOUNTER — Other Ambulatory Visit (HOSPITAL_COMMUNITY): Payer: Self-pay

## 2022-06-30 ENCOUNTER — Other Ambulatory Visit: Payer: Self-pay

## 2022-06-30 ENCOUNTER — Encounter: Payer: Self-pay | Admitting: *Deleted

## 2022-06-30 ENCOUNTER — Encounter (HOSPITAL_COMMUNITY): Payer: Self-pay | Admitting: Cardiovascular Disease

## 2022-06-30 ENCOUNTER — Other Ambulatory Visit: Payer: Self-pay | Admitting: *Deleted

## 2022-06-30 DIAGNOSIS — I5021 Acute systolic (congestive) heart failure: Secondary | ICD-10-CM | POA: Diagnosis not present

## 2022-06-30 DIAGNOSIS — I712 Thoracic aortic aneurysm, without rupture, unspecified: Secondary | ICD-10-CM

## 2022-06-30 DIAGNOSIS — I351 Nonrheumatic aortic (valve) insufficiency: Secondary | ICD-10-CM

## 2022-06-30 LAB — BASIC METABOLIC PANEL
Anion gap: 8 (ref 5–15)
BUN: 24 mg/dL — ABNORMAL HIGH (ref 8–23)
CO2: 22 mmol/L (ref 22–32)
Calcium: 8.9 mg/dL (ref 8.9–10.3)
Chloride: 109 mmol/L (ref 98–111)
Creatinine, Ser: 0.95 mg/dL (ref 0.61–1.24)
GFR, Estimated: >60 mL/min (ref >60)
Glucose, Bld: 94 mg/dL (ref 70–99)
Potassium: 4.3 mmol/L (ref 3.5–5.1)
Sodium: 139 mmol/L (ref 135–145)

## 2022-06-30 LAB — LIPID PANEL
Cholesterol: 185 mg/dL (ref 0–200)
HDL: 50 mg/dL (ref >40)
LDL Cholesterol: 125 mg/dL — ABNORMAL HIGH (ref 0–99)
Total CHOL/HDL Ratio: 3.7 RATIO
Triglycerides: 52 mg/dL (ref <150)
VLDL: 10 mg/dL (ref 0–40)

## 2022-06-30 MED ORDER — SPIRONOLACTONE 12.5 MG HALF TABLET
12.5000 mg | ORAL_TABLET | Freq: Every day | ORAL | Status: DC
  Administered 2022-06-30: 12.5 mg via ORAL
  Filled 2022-06-30: qty 1

## 2022-06-30 MED ORDER — ATORVASTATIN CALCIUM 40 MG PO TABS
40.0000 mg | ORAL_TABLET | Freq: Every day | ORAL | Status: DC
  Administered 2022-06-30: 40 mg via ORAL
  Filled 2022-06-30: qty 1

## 2022-06-30 MED ORDER — SPIRONOLACTONE 25 MG PO TABS
12.5000 mg | ORAL_TABLET | Freq: Every day | ORAL | 5 refills | Status: DC
  Filled 2022-06-30: qty 15, 30d supply, fill #0

## 2022-06-30 MED ORDER — ASPIRIN 81 MG PO TBEC
81.0000 mg | DELAYED_RELEASE_TABLET | Freq: Every day | ORAL | 12 refills | Status: DC
  Filled 2022-06-30: qty 30, 30d supply, fill #0

## 2022-06-30 MED ORDER — ASPIRIN 81 MG PO TBEC
81.0000 mg | DELAYED_RELEASE_TABLET | Freq: Every day | ORAL | Status: DC
  Administered 2022-06-30: 81 mg via ORAL
  Filled 2022-06-30: qty 1

## 2022-06-30 MED ORDER — ATORVASTATIN CALCIUM 40 MG PO TABS
40.0000 mg | ORAL_TABLET | Freq: Every day | ORAL | 5 refills | Status: DC
  Filled 2022-06-30: qty 30, 30d supply, fill #0

## 2022-06-30 MED ORDER — SACUBITRIL-VALSARTAN 24-26 MG PO TABS
1.0000 | ORAL_TABLET | Freq: Two times a day (BID) | ORAL | 5 refills | Status: DC
  Filled 2022-06-30: qty 60, 30d supply, fill #0

## 2022-06-30 MED ORDER — EMPAGLIFLOZIN 10 MG PO TABS
10.0000 mg | ORAL_TABLET | Freq: Every day | ORAL | 5 refills | Status: DC
  Filled 2022-06-30: qty 30, 30d supply, fill #0

## 2022-06-30 NOTE — Progress Notes (Signed)
Pt was educated on HF using HF book with mother present. Pt is not able to do CRPII at this time.  Joe [name] 06/30/2022 12:08 PM'

## 2022-06-30 NOTE — Progress Notes (Addendum)
Advanced Heart Failure Rounding Note  PCP-Cardiologist: Skeet Latch, MD   Subjective:    L/RHC yesterday: Well compensated hemodynamics. 60-70% prox RCA lesion otherwise normal cors.   Fick CO 4.75 L/min Fick CO Index 2.53 (L/min)/BSA RA Mean 1 mmHg RV Systolic Pressure 15 mmHg RV Diastolic Pressure -3 mmHg RV EDP 2 mmHg PA Systolic Pressure 18 mmHg PA Diastolic Pressure 5 mmHg PA Mean 9 mmHg PW A Wave 5 mmHg PW V Wave 4 mmHg PW Mean 3 mmHg AO Systolic Pressure 97 mmHg AO Diastolic Pressure 40 mmHg AO Mean 62 mmHg  Feels good this morning, mom at bedside. Denies CP/SOB.    Objective:   Weight Range: 73.8 kg Body mass index is 24.75 kg/m.   Vital Signs:   Temp:  [97.4 F (36.3 C)-98.2 F (36.8 C)] 98.2 F (36.8 C) (09/19 0813) Pulse Rate:  [31-73] 53 (09/19 0813) Resp:  [14-25] 18 (09/19 0813) BP: (97-159)/(40-62) 124/50 (09/19 0813) SpO2:  [91 %-97 %] 95 % (09/19 0813) Last BM Date : 06/28/22  Weight change: Filed Weights   06/25/22 1821 06/26/22 0436 06/29/22 0525  Weight: 75.1 kg 73.6 kg 73.8 kg    Intake/Output:   Intake/Output Summary (Last 24 hours) at 06/30/2022 0845 Last data filed at 06/30/2022 0815 Gross per 24 hour  Intake 240 ml  Output 1100 ml  Net -860 ml      Physical Exam    General:  well appearing. No respiratory difficulty HEENT: normal Neck: supple. No JVD. Carotids 2+ bilat; no bruits. No lymphadenopathy or thyromegaly appreciated. Cor: PMI nondisplaced. Regular rate & rhythm. No rubs, gallops. 2/6 diastolic murmur  Lungs: clear Abdomen: soft, nontender, nondistended. No hepatosplenomegaly. No bruits or masses. Good bowel sounds. Extremities: no cyanosis, clubbing, rash, edema  Neuro: alert & oriented x 3, cranial nerves grossly intact. moves all 4 extremities w/o difficulty. Affect pleasant.   Telemetry   NSR 60s, 4-10 PVC's/min (Personally reviewed)    EKG    No new EKG to review  Labs    CBC Recent Labs     06/28/22 0132 06/29/22 0259 06/29/22 1455 06/29/22 1456  WBC 5.7 4.9  --   --   HGB 13.9 14.3 15.3 15.6  HCT 38.4* 39.4 45.0 46.0  MCV 100.0 101.5*  --   --   PLT 180 187  --   --    Basic Metabolic Panel Recent Labs    06/29/22 0259 06/29/22 1455 06/29/22 1456 06/30/22 0432  NA 141   < > 139 139  K 4.4   < > 4.3 4.3  CL 109  --   --  109  CO2 25  --   --  22  GLUCOSE 96  --   --  94  BUN 20  --   --  24*  CREATININE 1.09  --   --  0.95  CALCIUM 8.9  --   --  8.9   < > = values in this interval not displayed.   Liver Function Tests No results for input(s): "AST", "ALT", "ALKPHOS", "BILITOT", "PROT", "ALBUMIN" in the last 72 hours. No results for input(s): "LIPASE", "AMYLASE" in the last 72 hours. Cardiac Enzymes No results for input(s): "CKTOTAL", "CKMB", "CKMBINDEX", "TROPONINI" in the last 72 hours.  BNP: BNP (last 3 results) Recent Labs    06/25/22 1829  BNP 436.2*    ProBNP (last 3 results) No results for input(s): "PROBNP" in the last 8760 hours.   D-Dimer No results  for input(s): "DDIMER" in the last 72 hours. Hemoglobin A1C No results for input(s): "HGBA1C" in the last 72 hours. Fasting Lipid Panel Recent Labs    06/30/22 0432  CHOL 185  HDL 50  LDLCALC 125*  TRIG 52  CHOLHDL 3.7   Thyroid Function Tests No results for input(s): "TSH", "T4TOTAL", "T3FREE", "THYROIDAB" in the last 72 hours.  Invalid input(s): "FREET3"  Other results:   Imaging    CARDIAC CATHETERIZATION  Result Date: 06/29/2022 1.  Moderate proximal RCA stenosis and an angulated portion of the vessel, estimated at 60 to 70% 2.  Patent left main, LAD, and left circumflex with mild luminal irregularities 3.  Low left and right heart filling pressures with preserved cardiac output Suspect medical therapy will be appropriate for his CAD.  Patient appears to be well diuresed and compensated with respect to his congestive heart failure.     Medications:     Scheduled  Medications:  empagliflozin  10 mg Oral Daily   fluticasone  2 spray Each Nare Daily   heparin  5,000 Units Subcutaneous Q8H   LORazepam  1 mg Oral QHS   polyethylene glycol  17 g Oral Daily   sacubitril-valsartan  1 tablet Oral BID   sodium chloride flush  3 mL Intravenous Q12H   sodium chloride flush  3 mL Intravenous Q12H   tamsulosin  0.4 mg Oral BID    Infusions:  sodium chloride      PRN Medications: sodium chloride, acetaminophen, alum & mag hydroxide-simeth, bisacodyl, hydrocortisone cream, ondansetron (ZOFRAN) IV, senna-docusate, sodium chloride flush    Patient Profile   66 y.o. male with history of anxiety and mild cognitive impairment. Admitted with new systolic CHF/cardiomyopathy in setting of severe AI/aortic root aneurysm and possible obstructive CAD. CT surgery planning aortic root replacement and possible CABG pending R/LHC today. Advanced Heart Failure asked to assist with pre-op optimization in view of new severely reduced EF.  Assessment/Plan   Ascending Aortic Aneurysm/ Severe AI  - CTA  w/ severe dilation at the sinus of Valsalva, 67 mm. Moderate dilation of mid ascending aorta, 44 mm. Tricuspid AoV - CT surgery planning aortic root/ valve replacement +/- CABG. Plan for admission for surgery next Wednesday w/ possible epicardial lead placement.  - BP/HR controlled   2. Acute Systolic Heart Failure - newly diagnosed, in setting of severe AI and likely MV CAD - Echo 9/23 EF 25-30%, LV severely dilated, severe AI, Global HK, RV ok  - L/RHC yesterday: Well compensated hemodynamics. 60-70% prox RCA lesion otherwise normal cors. - appears euvolemic on exam. No need for diuretic right now.  - continue Entresto 24-26 mg bid - continue Jardiance 10 mg daily  - start spironolactone 12.5 daily - w/ resting HR in the upper 50s, would hold off on addition of ? blocker - EP following, plan for CRT-D after surgery    3. CAD  - Coronary CTA suggestive of probable  severe CAD in mid LAD and distal RCA, 70-99% stenosis. FFR could not be fully processed due to image quality - LHC 9/18: Moderate proximal RCA stenosis and an angulated portion of the vessel, estimated at 60 to 70%. Patent left main, LAD, and left circumflex with mild luminal irregularities - suspect he will likely need CABG at time of aortic root/valve replacement. Plan for surgery 9/27 - Start ASA 81 mg daily  - Start atorvastatin 40 mg daily today  - LDL elevated at 125, goal <70   Ok to discharge  from AHF standpoint. Optimized on GDMT as best for now, will continue to follow OP. Plan for admission for surgery next Wednesday w/ possible epicardial lead placement.   AHF meds at discharge:  ASA '81mg'$  daily Atorvastatin '40mg'$  daily Jardiance '10mg'$  daily Entresto 24-'26mg'$  BID Spironolactone 12.'5mg'$  daily Lasix '20mg'$  PRN  Length of Stay: [place], AGACNP-BC  06/30/2022, 8:45 AM  Advanced Heart Failure Team Pager [phone] (M-F; 7a - 5p)  Please contact [facility] for night-coverage after hours (5p -7a ) and weekends on [email or web address]  Patient seen and examined with the above-signed Advanced Practice Provider and/or Housestaff. I personally reviewed laboratory data, imaging studies and relevant notes. I independently examined the patient and formulated the important aspects of the plan. I have edited the note to reflect any of my changes or salient points. I have personally discussed the plan with the patient and/or family.  Denies CP or SOB. Vitals stable. EP has seen.  General:  Well appearing. No resp difficulty HEENT: normal Neck: supple. no JVD. Carotids 2+ bilat; no bruits. No lymphadenopathy or thryomegaly appreciated. Cor: PMI nondisplaced. Regular rate & rhythm. 2/6 AI Lungs: clear Abdomen: soft, nontender, nondistended. No hepatosplenomegaly. No bruits or masses. Good bowel sounds. Extremities: no cyanosis, clubbing, rash, edema Neuro: alert & orientedx3, cranial nerves  grossly intact. moves all 4 extremities w/o difficulty. Affect pleasant  Ok for d/c on meds above. Will be readmitted for Ao Root replacement +/- AVR/CABG and epicardial lead placement next week. Will arrange f/u in HF Clinic. Meds d/w PharmD team and obtained from [facility].  Glori Bickers, MD  3:21 PM

## 2022-06-30 NOTE — Consult Note (Signed)
ELECTROPHYSIOLOGY CONSULT NOTE    Patient ID: [name] MRN: [id number], DOB/AGE: 1956/10/10 66 y.o.  Admit date: 06/25/2022 Date of Consult: 06/30/2022  Primary Physician: Willey Blade, MD Primary Cardiologist: Skeet Latch, MD  Electrophysiologist: New Dr. Quentin Ore   Referring Provider: Dr. Haroldine Laws   Patient Profile: Joe [name] is a 66 y.o. male with a history of acute systolic CHF, CAD, severe AI, and LBBB who is being seen today for the evaluation of LBBB and CHF at the request of Dr. Haroldine Laws   HPI:  Joe [name] is a 66 y.o. male with medical history as above.   Recently established with Dr. Oval Linsey 05/26/22 for abnormal ECG - notable LBBB and first degree heart block. He was exercising regularly but did have evidence of decompensated CHF on exam. New murmur heard on exam. Echo 06/25/22: EF 25-30%, severely dilated LV, moderate LVH, RV okay, severe LAE, severe tricuspid valve AI, aortic root severely dilated measuring 65 mm and ascending aorta measures 45 mm. He was called by Cardiology for direct admission to hospital for further workup after echo results reviewed.    Coronary CTA 06/26/22: Ascending aorta measures 67 mm at sinus of Valsava, ascending aorta measures 44 mm, calcium score 693 with concern for 70-99% stenosis in m LAD and d RCA, moderate to severe dilatation main pulmonary artery measuring 39 mm.   Echo 06/25/2022 showed LVEF 25-30%, severe LV dilation, Moderate LVH, Grade 1 DD, normal RV, severe LAE, Severe AI.   Pt diuresed and and started on GDMT as tolerated  TCTS saw and recommended Aortic root replacement +/- AVR  LHC 06/29/2022 showed 60-70% RCA lesion, otherwise normal coronaries with only mild luminal irregularities.   Per HF and TCTS, plan for discharge today, with re-admission next week for surgery. At that time, Surgeon will also place an epicardial LV lead given his LBBB, CHF, and potential need for pacing given valve surgery  and underlying conduction disease.   This am, pt is lying in bed in NAD. Mother is present in room.  Mother has a general understanding of the plan, and they do not currently have any questions.   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   Murmur 05/26/2022   Osteoarthritis of left hip 01/16/2014   Osteoarthritis of right hip 05/15/2014   Primary localized osteoarthrosis of left shoulder 09/21/2017   PVC (premature ventricular contraction) 05/26/2022   Right ureteral stone    Speech impediment    Umbilical hernia      Surgical History:  Past Surgical History:  Procedure Laterality Date   BACK SURGERY     CYSTOSCOPY W/ URETERAL STENT REMOVAL Right 04/19/2013   Procedure: CYSTOSCOPY WITH STENT REMOVAL;  Surgeon: Molli Hazard, MD;  Location: [facility];  Service: Urology;  Laterality: Right;   CYSTOSCOPY WITH RETROGRADE PYELOGRAM, URETEROSCOPY AND STENT PLACEMENT Right 04/19/2013   Procedure: CYSTOSCOPY WITH RETROGRADE PYELOGRAM, URETEROSCOPY ;  Surgeon: Molli Hazard, MD;  Location: [facility];  Service: Urology;  Laterality: Right;  CYSTOSCOPY WITH STENT PLACEMENT Right 03/24/2013   Procedure: CYSTOSCOPY WITH STENT PLACEMENT;  Surgeon: Hanley Ben, MD;  Location: [facility];  Service: Urology;  Laterality: Right;   HOLMIUM LASER APPLICATION Right 10/16/7827   Procedure: HOLMIUM LASER APPLICATION;  Surgeon: Molli Hazard, MD;  Location: [facility];  Service: Urology;  Laterality: Right;   INGUINAL HERNIA REPAIR Left 03-04-2005   POSTERIOR LUMBAR FUSION  08-27-2011   L4 -- L5    RIGHT HEART CATH AND CORONARY ANGIOGRAPHY N/A 06/29/2022   Procedure: RIGHT HEART CATH AND CORONARY ANGIOGRAPHY;  Surgeon: Sherren Mocha, MD;  Location: [place] [facility];  Service: Cardiovascular;  Laterality: N/A;   RIGHT SHOULDER HEMIARTHROPLASTY  08-26-2010   OA   SHOULDER HEMI-ARTHROPLASTY Left 09/21/2017   Procedure: SHOULDER HEMI-ARTHROPLASTY;  Surgeon: Marchia Bond, MD;  Location: [place];  Service: Orthopedics;  Laterality: Left;   TONSILLECTOMY  AS CHILD   TOTAL HIP ARTHROPLASTY Left 01/16/2014   Procedure: TOTAL HIP ARTHROPLASTY;  Surgeon: Johnny Bridge, MD;  Location: [place];  Service: Orthopedics;  Laterality: Left;   TOTAL HIP ARTHROPLASTY Right 05/15/2014   Procedure: RIGHT TOTAL HIP ARTHROPLASTY;  Surgeon: Johnny Bridge, MD;  Location: [place];  Service: Orthopedics;  Laterality: Right;   TOTAL SHOULDER ARTHROPLASTY Right 2011     Medications Prior to Admission  Medication Sig Dispense Refill Last Dose   CALCIUM-VITAMIN D PO Take 2 tablets by mouth daily.   06/24/2022   Cyanocobalamin (B-12) 5000 MCG CAPS Take 5,000 mcg by mouth daily.   06/24/2022   fluticasone (FLONASE) 50 MCG/ACT nasal spray Place 2 sprays into both nostrils daily. 16 g 1 06/24/2022   furosemide (LASIX) 20 MG tablet Take 1 tablet (20 mg total) by mouth as needed for edema. 90 tablet 3 Past Week   L-Theanine 200 MG CAPS Take 200 mg by mouth at bedtime.   06/24/2022   Multiple Vitamin (MULTIVITAMIN WITH MINERALS) TABS tablet Take 1 tablet by mouth daily.   06/24/2022   tamsulosin (FLOMAX) 0.4 MG CAPS capsule Take 0.4 mg by mouth 2 (two) times daily.   06/24/2022   UNABLE TO FIND Take 1 tablet by mouth daily. Med Name: Neuro optimizer   06/24/2022   UNABLE TO FIND Take 1-2 tablets by mouth See admin instructions. Med Name: Neuro Zac: Take 2 tablets by mouth in morning and 1 tablet by mouth  in the afternoon   06/24/2022   vitamin C (ASCORBIC ACID) 500 MG tablet Take 500 mg by mouth daily.   06/24/2022     Inpatient Medications:   empagliflozin  10 mg Oral Daily   fluticasone  2 spray Each Nare Daily   heparin  5,000 Units Subcutaneous Q8H   LORazepam  1 mg Oral QHS   polyethylene glycol  17 g Oral Daily   sacubitril-valsartan  1 tablet Oral BID   sodium chloride flush  3 mL Intravenous Q12H   sodium chloride flush  3 mL Intravenous Q12H   tamsulosin  0.4 mg Oral BID    Allergies:  Allergies  Allergen Reactions   Codeine Nausea Only   Levaquin [Levofloxacin In D5w] Diarrhea    Social History   Socioeconomic History   Marital status: Single    Spouse name: Not on file   Number of children: Not on file   Years of education: Not on file   Highest education level: Not on file  Occupational History   Occupation: unemployed  Tobacco Use  Smoking status: Never   Smokeless tobacco: Never  Vaping Use   Vaping Use: Never used  Substance and Sexual Activity   Alcohol use: No   Drug use: No   Sexual activity: Not Currently  Other Topics Concern   Not on file  Social History Narrative   Pt with delayed mental capabilities, 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  Lives with parents.   Social Determinants of Health   Financial Resource Strain: Low Risk  (10/11/2019)   Overall Financial Resource Strain (CARDIA)    Difficulty of Paying Living Expenses: Not hard at all  Food Insecurity: No Food Insecurity (06/29/2022)   Hunger Vital Sign    Worried About Running Out of Food in the Last Year: Never true    Ran Out of Food in the Last Year: Never true  Transportation Needs: No Transportation Needs (06/29/2022)   PRAPARE - Hydrologist (Medical): No    Lack of Transportation (Non-Medical): No  Physical Activity: Sufficiently Active (10/11/2019)   Exercise Vital Sign    Days of Exercise per Week: 5 days    Minutes of Exercise per Session: 40 min  Stress:  Stress Concern Present (10/11/2019)   [place]    Feeling of Stress : To some extent  Social Connections: Not on file  Intimate Partner Violence: Not At Risk (06/29/2022)   Humiliation, Afraid, Rape, and Kick questionnaire    Fear of Current or Ex-Partner: No    Emotionally Abused: No    Physically Abused: No    Sexually Abused: No     Family History  Problem Relation Age of Onset   Heart failure Mother    Atrial fibrillation Mother    Heart failure Maternal Grandmother    Atrial fibrillation Maternal Grandmother      Review of Systems: All other systems reviewed and are otherwise negative except as noted above.  Physical Exam: Vitals:   06/29/22 2311 06/30/22 0300 06/30/22 0400 06/30/22 0600  BP: (!) 121/40  (!) 112/44   Pulse: 66 (!) 43 (!) 31   Resp: 19 14 (!) 22   Temp: (!) 97.4 F (36.3 C)   (!) 97.5 F (36.4 C)  TempSrc: Oral   Oral  SpO2: 97% 93% 93%   Weight:      Height:        GEN- The patient is well appearing, alert and oriented x 3 today.   HEENT: normocephalic, atraumatic; sclera clear, conjunctiva pink; hearing intact; oropharynx clear; neck supple Lungs- Clear to ausculation bilaterally, normal work of breathing.  No wheezes, rales, rhonchi Heart- Regular rate and rhythm, no murmurs, rubs or gallops GI- soft, non-tender, non-distended, bowel sounds present Extremities- no clubbing, cyanosis, or edema; DP/PT/radial pulses 2+ bilaterally MS- no significant deformity or atrophy Skin- warm and dry, no rash or lesion Psych- euthymic mood, full affect Neuro- strength and sensation are intact  Labs:   Lab Results  Component Value Date   WBC 4.9 06/29/2022   HGB 15.6 06/29/2022   HCT 46.0 06/29/2022   MCV 101.5 (H) 06/29/2022   PLT 187 06/29/2022    Recent Labs  Lab 06/25/22 1829 06/26/22 0731 06/30/22 0432  NA 139   < > 139  K 4.3   < > 4.3  CL 106   < > 109  CO2 24   < > 22   BUN 15   < > 24*  CREATININE 0.83   < > 0.95  CALCIUM 9.7   < > 8.9  PROT 6.6  --   --   BILITOT 1.2  --   --   ALKPHOS 64  --   --   ALT 21  --   --   AST 21  --   --   GLUCOSE 96   < > 94   < > = values in this interval not displayed.      Radiology/Studies: CARDIAC CATHETERIZATION  Result Date: 06/29/2022 1.  Moderate proximal RCA stenosis and an angulated portion of the vessel, estimated at 60 to 70% 2.  Patent left main, LAD, and left circumflex with mild luminal irregularities 3.  Low left and right heart filling pressures with preserved cardiac output Suspect medical therapy will be appropriate for his CAD.  Patient appears to be well diuresed and compensated with respect to his congestive heart failure.   CT ANGIO CHEST AORTA W/ & OR WO/CM & GATING ([place] ONLY)  Result Date: 06/26/2022 EXAM: OVER-READ INTERPRETATION  CT CHEST The following report is a limited chest CT over-read performed by radiologist Dr. Markus Daft of [facility], [place] on 06/26/2022. This over-read does not include interpretation of cardiac or coronary anatomy or pathology. The coronary CTA interpretation by the cardiologist is attached. COMPARISON:  None Available. FINDINGS: Vascular: Enlargement of the ascending thoracic aorta and aortic root. Cardiac enlargement. Refer to the separate cardiologist report. Mediastinum/Nodes: Visualized mediastinal structures are normal. Lungs/Pleura: Volume loss in the bilateral lower lobes. Probable 3 mm calcified granuloma in the periphery of the left upper lobe on sequence 11 image 7. No large areas of consolidation or airspace disease in the visualized lungs. No significant pleural effusions. Upper Abdomen: Images of the upper abdomen are unremarkable. Musculoskeletal: No acute bone abnormality. IMPRESSION: 1. No acute extracardiac findings. 2. Enlargement of the ascending thoracic aorta and aortic root. Refer to the cardiologist report. Electronically Signed   By: Markus Daft M.D.   On: 06/26/2022 16:25   CT CORONARY MORPH W/CTA COR W/SCORE W/CA W/CM &/OR WO/CM  Result Date: 06/26/2022 HISTORY: Chest pain, nonspecific EXAM: Cardiac/Coronary  CT TECHNIQUE: The patient was scanned on a [employer]. PROTOCOL: A 120 kV prospective scan was triggered in the descending thoracic aorta at 111 HU's. Axial non-contrast 3 mm slices were carried out through the heart. The data set was analyzed on a dedicated work station and scored using the Agatston method. Gantry rotation speed was 250 msecs and collimation was .6 mm. Beta blockade and 0.8 mg of sl NTG was given. The 3D data set was reconstructed in 5% intervals of the 35-75 % of the R-R cycle. Systolic and diastolic phases were analyzed on a dedicated work station using MPR, MIP and VRT modes. The patient received contrast: 139m OMNIPAQUE IOHEXOL 350 MG/ML SOLN. FINDINGS: Image quality: Average Noise artifact is: Moderately reduced signal to noise, contrast to noise ratios. Cardiac motion artifact. AORTA: Severe dilation of the ascending aorta at the sinus of Valsalva. No dissection. No calcifications. Measurements made in double oblique technique: Sinus of Valsalva, measured sinus to sinus technique: R-L: 66 mm L-Non: 67 mm R-Non: 65 mm STJ: 46 mm Mid ascending aorta (at PA bifurcation): 44 mm Coronary calcium score is 693, which places the patient in the 82nd percentile for age and sex matched control. Coronary arteries: Normal coronary  origins.  Right dominance. Right Coronary Artery: Mild mixed atherosclerotic plaque in the proximal RCA, 25-49% stenosis. Distal RCA is diffusely disease however due to image quality, luminal stenosis cannot be fully evaluated. Possible severe stenosis in the distal RCA, 70-99% stenosis. Which are patent Left Main Coronary Artery: Minimal mixed atherosclerotic plaque in the ostial left main, <25% stenosis. Left Anterior Descending Coronary Artery: Probable severe mixed atherosclerotic plaque  in the mid LAD, 70-99% stenosis. Moderate calcified plaque in the mid-distal LAD at the level of the bifurcation of the second diagonal, 50-69% stenosis. Left Circumflex Artery: No detectable plaque or stenosis. Small OM1 and OM2, with large OM3 and diminutive distal circumflex artery. Aortic Valve: Trivial calcifications. AV calcium score 99. Other findings: Normal pulmonary vein drainage into the left atrium. Normal left atrial appendage without thrombus. Moderate-severe dilation of main pulmonary artery, 39 mm. IMPRESSION: Motion and noise artifact, as well as reduced contrast density degrade the diagnostic quality of this exam. 1. Ascending aortic aneurysm with severe dilation at the sinus of Valsalva, 67 mm. Moderate dilation of mid ascending aorta, 44 mm. 2. Probable severe CAD in mid LAD and distal RCA, 70-99% stenosis, CADRADS 4. FFR could not be fully processed due to image quality. 3. Coronary calcium score is 693, which places the patient in the 82nd percentile for age and sex matched control. 4. Normal coronary origins with right dominance. 5. Moderate-severe dilation of main pulmonary artery, 39 mm, suggests pulmonary hypertension. Alternatively in setting of dilated aorta, may indicate vascular pathology. RECOMMENDATIONS: CAD-RADS 4 Severe stenosis. (70-99% or > 50% left main). Cardiac catheterization or CT FFR is recommended. Consider symptom-guided anti-ischemic pharmacotherapy as well as risk factor modification per guideline directed care. Electronically Signed   By: Cherlynn Kaiser M.D.   On: 06/26/2022 16:06   ECHOCARDIOGRAM COMPLETE  Result Date: 06/25/2022    ECHOCARDIOGRAM REPORT   Patient Name:   Juvencio A Nease Date of Exam: 06/25/2022 Medical Rec #:  [id number]       Height:       64.0 in Accession #:    [id number]      Weight:       171.0 lb Date of Birth:  03-05-1956       BSA:          1.830 m Patient Age:    1 years        BP:           130/55 mmHg Patient Gender: M                HR:           69 bpm. Exam Location:  Outpatient Procedure: 2D Echo, 3D Echo, Cardiac Doppler, Color Doppler and Strain Analysis Indications:    PVC (premature ventricular contraction  History:        Patient has no prior history of Echocardiogram examinations.                 Abnormal ECG, Arrythmias:LBBB; Risk Factors:Non-Smoker. Mild                 cognitive impairment ;first degree heart block.  Sonographer:    Leavy Cella RDCS Referring Phys: [id number] TIFFANY Angelica IMPRESSIONS  1. Left ventricular ejection fraction, by estimation, is 25 to 30%. The left ventricle has severely decreased function. The left ventricle demonstrates global hypokinesis. The left ventricular internal cavity size was severely dilated. There is moderate  concentric left ventricular hypertrophy. Left ventricular diastolic parameters are  consistent with Grade I diastolic dysfunction (impaired relaxation). The average left ventricular global longitudinal strain is -7.0 %. The global longitudinal strain is abnormal. No LV thrombus.  2. Right ventricular systolic function is normal. The right ventricular size is normal. There is normal pulmonary artery systolic pressure.  3. Left atrial size was severely dilated.  4. Right atrial size was mildly dilated.  5. The mitral valve is normal in structure. Mild mitral valve regurgitation. No evidence of mitral stenosis.  6. The aortic valve is tricuspid. Aortic valve regurgitation is severe. There is diatsolic flow reversal (aortic arch asssessment). Eccentric jet related to root and Sinus of Valsalva dialtiont. Vena contracta 0.8 mm.  7. Pulmonic valve regurgitation is moderate.  8. Aortic dilatation noted. There is severe dilatation of the aortic root, measuring 65 mm. There is moderate dilatation of the ascending aorta, measuring 45 mm.  9. The inferior vena cava is normal in size with greater than 50% respiratory variability, suggesting right atrial pressure of 3 mmHg. 10. Cannot exclude a  small PFO. Conclusion(s)/Recommendation(s): Severe aortic root dilation with severe aortic regurgitation, LV dilation and dysfunction appears to be the sequelae of chronic severe AI. Will reach out to primary cardiologist. FINDINGS  Left Ventricle: Left ventricular ejection fraction, by estimation, is 25 to 30%. The left ventricle has severely decreased function. The left ventricle demonstrates global hypokinesis. The average left ventricular global longitudinal strain is -7.0 %. The global longitudinal strain is abnormal. 3D left ventricular ejection fraction analysis performed but not reported based on interpreter judgement due to suboptimal tracking. The left ventricular internal cavity size was severely dilated. There is moderate concentric left ventricular hypertrophy. Abnormal (paradoxical) septal motion, consistent with left bundle branch block. Left ventricular diastolic parameters are consistent with Grade I diastolic dysfunction (impaired relaxation). Right Ventricle: The right ventricular size is normal. No increase in right ventricular wall thickness. Right ventricular systolic function is normal. There is normal pulmonary artery systolic pressure. The tricuspid regurgitant velocity is 2.47 m/s, and  with an assumed right atrial pressure of 3 mmHg, the estimated right ventricular systolic pressure is 75.1 mmHg. Left Atrium: Left atrial size was severely dilated. Right Atrium: Right atrial size was mildly dilated. Pericardium: There is no evidence of pericardial effusion. Mitral Valve: The mitral valve is normal in structure. Mild mitral valve regurgitation. No evidence of mitral valve stenosis. Tricuspid Valve: The tricuspid valve is normal in structure. Tricuspid valve regurgitation is trivial. No evidence of tricuspid stenosis. Aortic Valve: The aortic valve is tricuspid. Aortic valve regurgitation is severe. Aortic regurgitation PHT measures 572 msec. Pulmonic Valve: The pulmonic valve was normal in  structure. Pulmonic valve regurgitation is moderate. No evidence of pulmonic stenosis. Aorta: Aortic dilatation noted. There is severe dilatation of the aortic root, measuring 65 mm. There is moderate dilatation of the ascending aorta, measuring 45 mm. Venous: The inferior vena cava is normal in size with greater than 50% respiratory variability, suggesting right atrial pressure of 3 mmHg. IAS/Shunts: Cannot exclude a small PFO.  LEFT VENTRICLE PLAX 2D LVIDd:         7.56 cm      Diastology LVIDs:         6.48 cm      LV e' medial:    8.92 cm/s LV PW:         1.64 cm      LV E/e' medial:  4.5 LV IVS:        1.27 cm  LV e' lateral:   6.64 cm/s LVOT diam:     2.30 cm      LV E/e' lateral: 6.0 LV SV:         82 LV SV Index:   45           2D Longitudinal Strain LVOT Area:     4.15 cm     2D Strain GLS Avg:     -7.0 %  LV Volumes (MOD) LV vol d, MOD A2C: 470.0 ml 3D Volume EF: LV vol d, MOD A4C: 553.5 ml 3D EF:        34 % LV vol s, MOD A2C: 316.0 ml LV EDV:       426 ml LV vol s, MOD A4C: 385.0 ml LV ESV:       279 ml LV SV MOD A2C:     154.0 ml LV SV:        147 ml LV SV MOD A4C:     553.5 ml LV SV MOD BP:      155.1 ml RIGHT VENTRICLE RV Basal diam:  3.83 cm RV Mid diam:    3.28 cm RV S prime:     12.40 cm/s TAPSE (M-mode): 2.5 cm LEFT ATRIUM              Index        RIGHT ATRIUM           Index LA Vol (A2C):   142.0 ml 77.58 ml/m  RA Area:     24.00 cm LA Vol (A4C):   101.0 ml 55.18 ml/m  RA Volume:   69.80 ml  38.13 ml/m LA Biplane Vol: 119.0 ml 65.01 ml/m  AORTIC VALVE LVOT Vmax:   87.40 cm/s LVOT Vmean:  58.600 cm/s LVOT VTI:    0.197 m AI PHT:      572 msec  AORTA Ao Root diam: 4.40 cm Ao Asc diam:  4.00 cm MITRAL VALVE               TRICUSPID VALVE MV Area (PHT): 2.69 cm    TR Peak grad:   24.4 mmHg MV Decel Time: 282 msec    TR Vmax:        247.00 cm/s MR Peak grad: 59.9 mmHg MR Vmax:      387.00 cm/s  SHUNTS MV E velocity: 40.00 cm/s  Systemic VTI:  0.20 m MV A velocity: 59.50 cm/s  Systemic Diam:  2.30 cm MV E/A ratio:  0.67 Rudean Haskell MD Electronically signed by Rudean Haskell MD Signature Date/Time: 06/25/2022/1:18:27 PM    Final     EKG: 9/14 showed NSR at 65 bpm with PVCs and LBBB >170 ms (personally reviewed)  TELEMETRY: NSR 60-70s with PVCs (personally reviewed)  Assessment/Plan:  1. Acute Systolic Heart Failure Newly diagnosed, in setting of severe AI and likely MV CAD Echo 9/23 EF 25-30%, LV severely dilated, severe AI, Global HK, RV ok  GDMT per primary and HF team.  Given LBBB and CHF, would plan LV epicardial lead at time of surgery, with CRT-D  2. Ascending Aortic Aneurysm/ Severe AI  - CTA  w/ severe dilation at the sinus of Valsalva, 67 mm. Moderate dilation of mid ascending aorta, 44 mm. Tricuspid AoV - CT surgery planning aortic root/ valve replacement next week.    3. CAD  LHC 9/18 with 60-70% RCA lesion otherwise normal cors.   Dr. Quentin Ore has seen the patient and we agree with plan for  epicardial LV wire at time of Aortic root replacement +/- AVR.   Would anticipated CRT-D placement prior to discharge once otherwise stable post-op.   For questions or updates, please contact [place] Please consult [email or web address] for contact info under Cardiology/STEMI.  Jacalyn Lefevre, PA-C  06/30/2022 7:31 AM

## 2022-06-30 NOTE — Discharge Summary (Cosign Needed)
Advanced Heart Failure Team  Discharge Summary   Patient ID: Joe [name] MRN: [id number], DOB/AGE: 04-24-1956 66 y.o. Admit date: 06/25/2022 D/C date:     06/30/2022   Primary Discharge Diagnoses:  Ascending aortic aneurysm / Severe AI Acute Systolic Heart Failure CAD LBBB  Hospital Course:  Joe [name] is a 66 y.o. male with history of anxiety and mild cognitive impairment.   Direct admit from [facility] w/ Joe [name]. Findings on EKG showed LBBB & 1st degree HB. Echo showed EF 25-30%. Pt w/ new systolic CHF/cardiomyopathy in setting of severe AI/aortic root aneurysm and possible obstructive CAD. On admission, CTA showed severe dilation at the sinus of Valsalva, 67 mm. Moderate dilation of mid ascending aorta, 44 mm. Tricuspid AoV. AHF team asked to see for GDMT optimization prior to surgery.   Underwent [facility] 9/18 which showed well compensated hemodynamics. 60-70% prox RCA lesion otherwise normal cors. GDMT able to be initiated, patient tolerating well. Discussed with TCTS, plan for Ao Root replacement +/- AVR/CABG and epicardial lead placement next Wednesday. EP evaluated and will do the CRT-D after surgery, during admission.   Pt will continue to be followed closely once admitted for surgery next Wednesday 9/27. Joe [name] evaluated and deemed appropriate for discharge.   See below for detailed problem list: Ascending Aortic Aneurysm/ Severe AI  - CTA  w/ severe dilation at the sinus of Valsalva, 67 mm. Moderate dilation of mid ascending aorta, 44 mm. Tricuspid AoV - CT surgery planning aortic root/ valve replacement +/- CABG. Plan for admission for surgery next Wednesday w/ possible epicardial lead placement.  - BP/HR controlled 2. Acute Systolic Heart Failure - newly diagnosed, in setting of severe AI and likely MV CAD - Echo 9/23 EF 25-30%, LV severely dilated, severe AI, Global HK, RV ok  - L/RHC yesterday: Well compensated hemodynamics. 60-70% prox RCA lesion otherwise  normal cors. - appears euvolemic on exam. No need for diuretic right now.  - continue Entresto 24-26 mg bid - continue Jardiance 10 mg daily  - start spironolactone 12.5 daily - w/ resting HR in the upper 50s, would hold off on addition of ? blocker - EP following, plan for CRT-D after surgery  3. CAD  - Coronary CTA suggestive of probable severe CAD in mid LAD and distal RCA, 70-99% stenosis. FFR could not be fully processed due to image quality - [facility] 9/18: Moderate proximal RCA stenosis and an angulated portion of the vessel, estimated at 60 to 70%. Patent left main, LAD, and left circumflex with mild luminal irregularities - suspect he will likely need CABG at time of aortic root/valve replacement. Plan for surgery 9/27 - Start ASA 81 mg daily  - Start atorvastatin 40 mg daily today  - LDL elevated at 125, goal <70  Discharge Weight Range: 73.8kg Discharge Vitals: Blood pressure 117/61, pulse (!) 59, temperature 97.8 F (36.6 C), temperature source Oral, resp. rate (!) 22, height '5\' 8"'$  (1.727 m), weight 73.8 kg, SpO2 97 %.  Labs: Lab Results  Component Value Date   WBC 4.9 06/29/2022   HGB 15.6 06/29/2022   HCT 46.0 06/29/2022   MCV 101.5 (H) 06/29/2022   PLT 187 06/29/2022    Recent Labs  Lab 06/25/22 1829 06/26/22 0731 06/30/22 0432  NA 139   < > 139  K 4.3   < > 4.3  CL 106   < > 109  CO2 24   < > 22  BUN 15   < > 24*  CREATININE 0.83   < > 0.95  CALCIUM 9.7   < > 8.9  PROT 6.6  --   --   BILITOT 1.2  --   --   ALKPHOS 64  --   --   ALT 21  --   --   AST 21  --   --   GLUCOSE 96   < > 94   < > = values in this interval not displayed.   Lab Results  Component Value Date   CHOL 185 06/30/2022   HDL 50 06/30/2022   LDLCALC 125 (H) 06/30/2022   TRIG 52 06/30/2022   BNP (last 3 results) Recent Labs    06/25/22 1829  BNP 436.2*    ProBNP (last 3 results) No results for input(s): "PROBNP" in the last 8760 hours.  Diagnostic Studies/Procedures    CARDIAC CATHETERIZATION  Result Date: 06/29/2022 1.  Moderate proximal RCA stenosis and an angulated portion of the vessel, estimated at 60 to 70% 2.  Patent left main, LAD, and left circumflex with mild luminal irregularities 3.  Low left and right heart filling pressures with preserved cardiac output Suspect medical therapy will be appropriate for his CAD.  Patient appears to be well diuresed and compensated with respect to his congestive heart failure.    CT CORONARY MORPH W/CTA COR W/SCORE W/CA W/CM &/OR WO/CM 1. No acute extracardiac findings. 2. Enlargement of the ascending thoracic aorta and aortic root. Refer to the cardiologist report.  Discharge Medications   Allergies as of 06/30/2022       Reactions   Codeine Nausea Only   Levaquin [levofloxacin In D5w] Diarrhea        Medication List     STOP taking these medications    ascorbic acid 500 MG tablet Commonly known as: VITAMIN C   B-12 5000 MCG Caps   CALCIUM-VITAMIN D PO   L-Theanine 200 MG Caps       TAKE these medications    Aspirin Low Dose 81 MG tablet Generic drug: aspirin EC Take 1 tablet (81 mg total) by mouth daily. Swallow whole. Start taking on: July 01, 2022   atorvastatin 40 MG tablet Commonly known as: LIPITOR Take 1 tablet (40 mg total) by mouth daily. Start taking on: July 01, 2022   Entresto 24-26 MG Generic drug: sacubitril-valsartan Take 1 tablet by mouth 2 (two) times daily.   fluticasone 50 MCG/ACT nasal spray Commonly known as: FLONASE Place 2 sprays into both nostrils daily.   furosemide 20 MG tablet Commonly known as: LASIX Take 1 tablet (20 mg total) by mouth as needed for edema.   Jardiance 10 MG Tabs tablet Generic drug: empagliflozin Take 1 tablet (10 mg total) by mouth daily. Start taking on: July 01, 2022   multivitamin with minerals Tabs tablet Take 1 tablet by mouth daily.   spironolactone 25 MG tablet Commonly known as: ALDACTONE Take  0.5 tablets (12.5 mg total) by mouth daily. Start taking on: July 01, 2022   tamsulosin 0.4 MG Caps capsule Commonly known as: FLOMAX Take 0.4 mg by mouth 2 (two) times daily.   UNABLE TO FIND Take 1 tablet by mouth daily. Med Name: Neuro optimizer   UNABLE TO FIND Take 1-2 tablets by mouth See admin instructions. Med Name: Neuro Zac: Take 2 tablets by mouth in morning and 1 tablet by mouth  in the afternoon        Disposition   The patient will be discharged in stable  condition to home. Discharge Instructions     (HEART FAILURE PATIENTS) Call MD:  Anytime you have any of the following symptoms: 1) 3 pound weight gain in 24 hours or 5 pounds in 1 week 2) shortness of breath, with or without a dry hacking cough 3) swelling in the hands, feet or stomach 4) if you have to sleep on extra pillows at night in order to breathe.   Complete by: As directed    Diet - low sodium heart healthy   Complete by: As directed    Heart Failure patients record your daily weight using the same scale at the same time of day   Complete by: As directed    Increase activity slowly   Complete by: As directed    STOP any activity that causes chest pain, shortness of breath, dizziness, sweating, or exessive weakness   Complete by: As directed        Follow-up Information     [facility].   Specialty: Cardiology Why: Call the clinic if you experience chest pain, shortness of breath, or any worsening symptoms.   [facility] information: [address] [id number] [place] [phone]                  Duration of Discharge Encounter: Greater than 35 minutes   Signed, Earnie Larsson, AGACNP-BC  06/30/2022, 3:32 PM   Patient seen and examined with the above-signed Advanced Practice Provider and/or Housestaff. I personally reviewed laboratory data, imaging studies and relevant notes. I  independently examined the patient and formulated the important aspects of the plan. I have edited the note to reflect any of my changes or salient points. I have personally discussed the plan with the patient and/or family.  Case d/w TCTS. Will d/c today and have him return next Wednesday for Surgery as above.   Glori Bickers, MD  4:25 PM

## 2022-06-30 NOTE — Plan of Care (Signed)

## 2022-07-01 LAB — LIPOPROTEIN A (LPA): Lipoprotein (a): 57.7 nmol/L — ABNORMAL HIGH

## 2022-07-02 ENCOUNTER — Ambulatory Visit (HOSPITAL_BASED_OUTPATIENT_CLINIC_OR_DEPARTMENT_OTHER): Payer: Medicare Other | Admitting: Family

## 2022-07-06 NOTE — Progress Notes (Signed)
Surgical Instructions    Your procedure is scheduled on Wednesday, September 27th, 2023.   Report to [facility] Main Entrance "A" at 05:30 A.M., then check in with the Admitting office.  Call this number if you have problems the morning of surgery:  [phone]   If you have any questions prior to your surgery date call [phone]: Open Monday-Friday 8am-4pm    Remember:  Do not eat or drink after midnight the night before your surgery    Take these medicines the morning of surgery with A SIP OF WATER:   atorvastatin (LIPITOR)  tamsulosin (FLOMAX) fluticasone (FLONASE) - as needed  Follow your surgeon's instructions on when to stop Aspirin.  If no instructions were given by your surgeon then you will need to call the office to get those instructions.     As of today, STOP taking any Aspirin (unless otherwise instructed by your surgeon) Aleve, Naproxen, Ibuprofen, Motrin, Advil, Goody's, BC's, all herbal medications, fish oil, and all vitamins.  Hold empagliflozin (JARDIANCE) 72 hrs prior your surgery.    The day of surgery:          Do not wear jewelry. Do not wear lotions, powders, cologne or deodorant. Men may shave face and neck. Do not bring valuables to the hospital.   [facility] is not responsible for any belongings or valuables.    Do NOT Smoke (Tobacco/Vaping)  24 hours prior to your procedure  If you use a CPAP at night, you may bring your mask for your overnight stay.   Contacts, glasses, hearing aids, dentures or partials may not be worn into surgery, please bring cases for these belongings   For patients admitted to the hospital, discharge time will be determined by your treatment team.   Patients discharged the day of surgery will not be allowed to drive home, and someone needs to stay with them for 24 hours.   SURGICAL WAITING ROOM VISITATION Patients having surgery or a procedure may have no more than 2 support people in the waiting area - these  visitors may rotate.   Children under the age of 83 must have an adult with them who is not the patient. If the patient needs to stay at the hospital during part of their recovery, the visitor guidelines for inpatient rooms apply. Pre-op nurse will coordinate an appropriate time for 1 support person to accompany patient in pre-op.  This support person may not rotate.   Please refer to the [facility] website for the visitor guidelines for Inpatients (after your surgery is over and you are in a regular room).    Special instructions:    Oral Hygiene is also important to reduce your risk of infection.  Remember - BRUSH YOUR TEETH THE MORNING OF SURGERY WITH YOUR REGULAR TOOTHPASTE   [facility]- Preparing For Surgery  Before surgery, you can play an important role. Because skin is not sterile, your skin needs to be as free of germs as possible. You can reduce the number of germs on your skin by washing with CHG (chlorahexidine gluconate) Soap before surgery.  CHG is an antiseptic cleaner which kills germs and bonds with the skin to continue killing germs even after washing.     Please do not use if you have an allergy to CHG or antibacterial soaps. If your skin becomes reddened/irritated stop using the CHG.  Do not shave (including legs and underarms) for at least 48 hours prior to first CHG shower. It is OK to  shave your face.  Please follow these instructions carefully.     Shower the NIGHT BEFORE SURGERY and the MORNING OF SURGERY with CHG Soap.   If you chose to wash your hair, wash your hair first as usual with your normal shampoo. After you shampoo, rinse your hair and body thoroughly to remove the shampoo.  Then [employer] and genitals (private parts) with your normal soap and rinse thoroughly to remove soap.  After that Use CHG Soap as you would any other liquid soap. You can apply CHG directly to the skin and wash gently with a scrungie or a clean washcloth.   Apply the CHG Soap to  your body ONLY FROM THE NECK DOWN.  Do not use on open wounds or open sores. Avoid contact with your eyes, ears, mouth and genitals (private parts). Wash Face and genitals (private parts)  with your normal soap.   Wash thoroughly, paying special attention to the area where your surgery will be performed.  Thoroughly rinse your body with warm water from the neck down.  DO NOT shower/wash with your normal soap after using and rinsing off the CHG Soap.  Pat yourself dry with a CLEAN TOWEL.  Wear CLEAN PAJAMAS to bed the night before surgery  Place CLEAN SHEETS on your bed the night before your surgery  DO NOT SLEEP WITH PETS.   Day of Surgery:  Take a shower with CHG soap. Wear Clean/Comfortable clothing the morning of surgery Do not apply any deodorants/lotions.   Remember to brush your teeth WITH YOUR REGULAR TOOTHPASTE.    If you received a COVID test during your pre-op visit, it is requested that you wear a mask when out in public, stay away from anyone that may not be feeling well, and notify your surgeon if you develop symptoms. If you have been in contact with anyone that has tested positive in the last 10 days, please notify your surgeon.    Please read over the following fact sheets that you were given.

## 2022-07-07 ENCOUNTER — Other Ambulatory Visit: Payer: Self-pay

## 2022-07-07 ENCOUNTER — Encounter (HOSPITAL_COMMUNITY): Payer: Self-pay

## 2022-07-07 ENCOUNTER — Encounter (HOSPITAL_COMMUNITY)
Admit: 2022-07-07 | Discharge: 2022-07-07 | Disposition: A | Discharge status: Home / Self Care | Payer: Medicare Other | Attending: Thoracic Surgery (Cardiothoracic Vascular Surgery) | Admitting: Thoracic Surgery (Cardiothoracic Vascular Surgery)

## 2022-07-07 ENCOUNTER — Ambulatory Visit (HOSPITAL_COMMUNITY)
Admission: RE | Admit: 2022-07-07 | Discharge: 2022-07-07 | Disposition: A | Discharge status: Home / Self Care | Payer: Medicare Other | Source: Ambulatory Visit | Attending: Thoracic Surgery (Cardiothoracic Vascular Surgery) | Admitting: Thoracic Surgery (Cardiothoracic Vascular Surgery)

## 2022-07-07 VITALS — BP 140/80 | HR 63 | Temp 98.2°F | Resp 18 | Ht 67.0 in | Wt 160.5 lb

## 2022-07-07 DIAGNOSIS — M19012 Primary osteoarthritis, left shoulder: Secondary | ICD-10-CM | POA: Insufficient documentation

## 2022-07-07 DIAGNOSIS — I472 Ventricular tachycardia, unspecified: Secondary | ICD-10-CM | POA: Insufficient documentation

## 2022-07-07 DIAGNOSIS — I371 Nonrheumatic pulmonary valve insufficiency: Secondary | ICD-10-CM | POA: Insufficient documentation

## 2022-07-07 DIAGNOSIS — R011 Cardiac murmur, unspecified: Secondary | ICD-10-CM | POA: Insufficient documentation

## 2022-07-07 DIAGNOSIS — I712 Thoracic aortic aneurysm, without rupture, unspecified: Secondary | ICD-10-CM

## 2022-07-07 DIAGNOSIS — I440 Atrioventricular block, first degree: Secondary | ICD-10-CM | POA: Insufficient documentation

## 2022-07-07 DIAGNOSIS — I110 Hypertensive heart disease with heart failure: Secondary | ICD-10-CM | POA: Insufficient documentation

## 2022-07-07 DIAGNOSIS — R609 Edema, unspecified: Secondary | ICD-10-CM | POA: Insufficient documentation

## 2022-07-07 DIAGNOSIS — I491 Atrial premature depolarization: Secondary | ICD-10-CM | POA: Insufficient documentation

## 2022-07-07 DIAGNOSIS — I471 Supraventricular tachycardia: Secondary | ICD-10-CM | POA: Insufficient documentation

## 2022-07-07 DIAGNOSIS — I5020 Unspecified systolic (congestive) heart failure: Secondary | ICD-10-CM | POA: Insufficient documentation

## 2022-07-07 DIAGNOSIS — I2510 Atherosclerotic heart disease of native coronary artery without angina pectoris: Secondary | ICD-10-CM | POA: Insufficient documentation

## 2022-07-07 DIAGNOSIS — I447 Left bundle-branch block, unspecified: Secondary | ICD-10-CM | POA: Insufficient documentation

## 2022-07-07 DIAGNOSIS — I350 Nonrheumatic aortic (valve) stenosis: Secondary | ICD-10-CM | POA: Insufficient documentation

## 2022-07-07 DIAGNOSIS — I429 Cardiomyopathy, unspecified: Secondary | ICD-10-CM | POA: Insufficient documentation

## 2022-07-07 DIAGNOSIS — R008 Other abnormalities of heart beat: Secondary | ICD-10-CM | POA: Insufficient documentation

## 2022-07-07 DIAGNOSIS — I351 Nonrheumatic aortic (valve) insufficiency: Secondary | ICD-10-CM | POA: Insufficient documentation

## 2022-07-07 DIAGNOSIS — Z01818 Encounter for other preprocedural examination: Secondary | ICD-10-CM | POA: Insufficient documentation

## 2022-07-07 DIAGNOSIS — I7121 Aneurysm of the ascending aorta, without rupture: Secondary | ICD-10-CM | POA: Insufficient documentation

## 2022-07-07 DIAGNOSIS — Z20822 Contact with and (suspected) exposure to covid-19: Secondary | ICD-10-CM | POA: Insufficient documentation

## 2022-07-07 HISTORY — DX: Atherosclerotic heart disease of native coronary artery without angina pectoris: I25.10

## 2022-07-07 HISTORY — DX: Aneurysm of the ascending aorta, without rupture: I71.21

## 2022-07-07 HISTORY — DX: Essential (primary) hypertension: I10

## 2022-07-07 LAB — URINALYSIS, ROUTINE W REFLEX MICROSCOPIC
Bilirubin Urine: NEGATIVE
Glucose, UA: >=500 mg/dL — AB
Hgb urine dipstick: NEGATIVE
Ketones, ur: NEGATIVE mg/dL
Nitrite: POSITIVE — AB
Protein, ur: NEGATIVE mg/dL
Specific Gravity, Urine: 1.015 (ref 1.005–1.030)
pH: 6 (ref 5.0–8.0)

## 2022-07-07 LAB — COMPREHENSIVE METABOLIC PANEL
ALT: 23 U/L (ref 0–44)
AST: 23 U/L (ref 15–41)
Albumin: 3.9 g/dL (ref 3.5–5.0)
Alkaline Phosphatase: 67 U/L (ref 38–126)
Anion gap: 8 (ref 5–15)
BUN: 11 mg/dL (ref 8–23)
CO2: 20 mmol/L — ABNORMAL LOW (ref 22–32)
Calcium: 9.4 mg/dL (ref 8.9–10.3)
Chloride: 109 mmol/L (ref 98–111)
Creatinine, Ser: 0.88 mg/dL (ref 0.61–1.24)
GFR, Estimated: >60 mL/min (ref >60)
Glucose, Bld: 108 mg/dL — ABNORMAL HIGH (ref 70–99)
Potassium: 4.5 mmol/L (ref 3.5–5.1)
Sodium: 137 mmol/L (ref 135–145)
Total Bilirubin: 1.2 mg/dL (ref 0.3–1.2)
Total Protein: 6.5 g/dL (ref 6.5–8.1)

## 2022-07-07 LAB — PROTIME-INR
INR: 1.1 (ref 0.8–1.2)
Prothrombin Time: 13.9 seconds (ref 11.4–15.2)

## 2022-07-07 LAB — URINALYSIS, MICROSCOPIC (REFLEX): RBC / HPF: NONE SEEN RBC/hpf (ref 0–5)

## 2022-07-07 LAB — BLOOD GAS, ARTERIAL
Acid-Base Excess: 0.9 mmol/L (ref 0.0–2.0)
Bicarbonate: 24 mmol/L (ref 20.0–28.0)
Drawn by: 58793
O2 Saturation: 99 %
Patient temperature: 37
pCO2 arterial: 33 mmHg (ref 32–48)
pH, Arterial: 7.47 — ABNORMAL HIGH (ref 7.35–7.45)
pO2, Arterial: 97 mmHg (ref 83–108)

## 2022-07-07 LAB — CBC
HCT: 43.1 % (ref 39.0–52.0)
Hemoglobin: 15.3 g/dL (ref 13.0–17.0)
MCH: 36.3 pg — ABNORMAL HIGH (ref 26.0–34.0)
MCHC: 35.5 g/dL (ref 30.0–36.0)
MCV: 102.1 fL — ABNORMAL HIGH (ref 80.0–100.0)
Platelets: 210 10*3/uL (ref 150–400)
RBC: 4.22 MIL/uL (ref 4.22–5.81)
RDW: 12.7 % (ref 11.5–15.5)
WBC: 4.9 10*3/uL (ref 4.0–10.5)
nRBC: 0 % (ref 0.0–0.2)

## 2022-07-07 LAB — SURGICAL PCR SCREEN
MRSA, PCR: NEGATIVE
Staphylococcus aureus: NEGATIVE

## 2022-07-07 LAB — APTT: aPTT: 31 seconds (ref 24–36)

## 2022-07-07 LAB — HEMOGLOBIN A1C
Hgb A1c MFr Bld: 5.3 % (ref 4.8–5.6)
Mean Plasma Glucose: 105.41 mg/dL

## 2022-07-07 MED ORDER — PHENYLEPHRINE HCL-NACL 20-0.9 MG/250ML-% IV SOLN
30.0000 ug/min | INTRAVENOUS | Status: DC
  Filled 2022-07-07: qty 250

## 2022-07-07 MED ORDER — TRANEXAMIC ACID 1000 MG/10ML IV SOLN
1.5000 mg/kg/h | INTRAVENOUS | Status: AC
  Administered 2022-07-08: 1.5 mg/kg/h via INTRAVENOUS
  Filled 2022-07-07: qty 25

## 2022-07-07 MED ORDER — MANNITOL 20 % IV SOLN
INTRAVENOUS | Status: DC
  Filled 2022-07-07: qty 13

## 2022-07-07 MED ORDER — PLASMA-LYTE A IV SOLN
INTRAVENOUS | Status: DC
  Filled 2022-07-07: qty 2.5

## 2022-07-07 MED ORDER — MILRINONE LACTATE IN DEXTROSE 20-5 MG/100ML-% IV SOLN
0.3000 ug/kg/min | INTRAVENOUS | Status: DC
  Filled 2022-07-07: qty 100

## 2022-07-07 MED ORDER — HEPARIN 30,000 UNITS/1000 ML (OHS) CELLSAVER SOLUTION
Status: DC
  Filled 2022-07-07: qty 1000

## 2022-07-07 MED ORDER — EPINEPHRINE HCL 5 MG/250ML IV SOLN IN NS
0.0000 ug/min | INTRAVENOUS | Status: AC
  Administered 2022-07-08: 2 ug/min via INTRAVENOUS
  Filled 2022-07-07: qty 250

## 2022-07-07 MED ORDER — TRANEXAMIC ACID (OHS) BOLUS VIA INFUSION
15.0000 mg/kg | INTRAVENOUS | Status: AC
  Administered 2022-07-08: 1092 mg via INTRAVENOUS
  Filled 2022-07-07: qty 1092

## 2022-07-07 MED ORDER — CEFAZOLIN SODIUM-DEXTROSE 2-4 GM/100ML-% IV SOLN
2.0000 g | INTRAVENOUS | Status: DC
  Filled 2022-07-07: qty 100

## 2022-07-07 MED ORDER — TRANEXAMIC ACID (OHS) PUMP PRIME SOLUTION
2.0000 mg/kg | INTRAVENOUS | Status: DC
  Filled 2022-07-07: qty 1.46

## 2022-07-07 MED ORDER — VANCOMYCIN HCL 1250 MG/250ML IV SOLN
1250.0000 mg | INTRAVENOUS | Status: AC
  Administered 2022-07-08: 1250 mg via INTRAVENOUS
  Filled 2022-07-07: qty 250

## 2022-07-07 MED ORDER — CEFAZOLIN SODIUM-DEXTROSE 2-4 GM/100ML-% IV SOLN
2.0000 g | INTRAVENOUS | Status: AC
  Administered 2022-07-08 (×2): 2 g via INTRAVENOUS
  Filled 2022-07-07: qty 100

## 2022-07-07 MED ORDER — NOREPINEPHRINE 4 MG/250ML-% IV SOLN
0.0000 ug/min | INTRAVENOUS | Status: AC
  Administered 2022-07-08: 2 ug/min via INTRAVENOUS
  Filled 2022-07-07: qty 250

## 2022-07-07 MED ORDER — DEXMEDETOMIDINE HCL IN NACL 400 MCG/100ML IV SOLN
0.1000 ug/kg/h | INTRAVENOUS | Status: AC
  Administered 2022-07-08: .5 ug/kg/h via INTRAVENOUS
  Filled 2022-07-07: qty 100

## 2022-07-07 MED ORDER — VANCOMYCIN HCL 1000 MG IV SOLR
INTRAVENOUS | Status: DC
  Filled 2022-07-07: qty 20

## 2022-07-07 MED ORDER — POTASSIUM CHLORIDE 2 MEQ/ML IV SOLN
80.0000 meq | INTRAVENOUS | Status: DC
  Filled 2022-07-07: qty 40

## 2022-07-07 MED ORDER — INSULIN REGULAR(HUMAN) IN NACL 100-0.9 UT/100ML-% IV SOLN
INTRAVENOUS | Status: AC
  Administered 2022-07-08: 1.2 [IU]/h via INTRAVENOUS
  Filled 2022-07-07 (×3): qty 100

## 2022-07-07 NOTE — Anesthesia Preprocedure Evaluation (Addendum)
Anesthesia Evaluation  Patient identified by MRN, date of birth, ID band Patient awake    Reviewed: Allergy & Precautions, NPO status , Patient's Chart, lab work & pertinent test results  History of Anesthesia Complications Negative for: history of anesthetic complications  Airway Mallampati: I  TM Distance: >3 FB Neck ROM: Full    Dental  (+) Dental Advisory Given   Pulmonary neg pulmonary ROS,    Pulmonary exam normal        Cardiovascular hypertension, Pt. on medications + CAD  + Valvular Problems/Murmurs AI  Rhythm:Regular Rate:Normal + Diastolic murmurs  '23 Cath - 1.  Moderate proximal RCA stenosis and an angulated portion of the vessel, estimated at 60 to 70% 2.  Patent left main, LAD, and left circumflex with mild luminal irregularities 3.  Low left and right heart filling pressures with preserved cardiac output  '23 TTE - EF 25 to 30%. Global hypokinesis. The left ventricular internal cavity size was severely dilated. There is moderate concentric left ventricular hypertrophy. Grade I diastolic dysfunction (impaired relaxation). Left atrial size was severely dilated. Right atrial size was mildly dilated. Mild mitral valve regurgitation. Aortic valve regurgitation is severe. There is diatsolic flow reversal (aortic arch asssessment). Eccentric jet related to root and Sinus of Valsalva dialtion. Vena contracta 0.8 mm. Pulmonic valve regurgitation is moderate. There is severe dilatation of the aortic root, measuring 65 mm. There is moderate dilatation of the ascending aorta, measuring 45 mm. Cannot exclude a small PFO.      Neuro/Psych PSYCHIATRIC DISORDERS Anxiety  Cognitive deficits, independent with ADLs Expressive speech delay     GI/Hepatic negative GI ROS, Neg liver ROS,   Endo/Other  negative endocrine ROS  Renal/GU negative Renal ROS     Musculoskeletal  (+) Arthritis ,   Abdominal   Peds   Hematology negative hematology ROS (+)   Anesthesia Other Findings   Reproductive/Obstetrics                           Anesthesia Physical Anesthesia Plan  ASA: 4  Anesthesia Plan: General   Post-op Pain Management:    Induction: Intravenous  PONV Risk Score and Plan: 2 and Treatment may vary due to age or medical condition  Airway Management Planned: Oral ETT  Additional Equipment: Arterial line, CVP, PA Cath, TEE and Ultrasound Guidance Line Placement  Intra-op Plan:   Post-operative Plan: Post-operative intubation/ventilation  Informed Consent: I have reviewed the patients History and Physical, chart, labs and discussed the procedure including the risks, benefits and alternatives for the proposed anesthesia with the patient or authorized representative who has indicated his/her understanding and acceptance.     Dental advisory given and Consent reviewed with POA  Plan Discussed with: CRNA and Anesthesiologist  Anesthesia Plan Comments: (PAT note written 07/07/2022 by Myra Gianotti, PA-C. )      Anesthesia Quick Evaluation

## 2022-07-07 NOTE — Progress Notes (Signed)
Notified ryan Rolena Infante of abnormal U/A result.

## 2022-07-07 NOTE — Progress Notes (Signed)
Anesthesia Chart Review:  Case: [id number] Date/Time: 07/08/22 0715   Procedures:      ASCENDING AORTIC ROOT REPLACEMENT (Chest)     possible CORONARY ARTERY BYPASS GRAFTING (CABG) (Chest)     TRANSESOPHAGEAL ECHOCARDIOGRAM (TEE)     EPICARDIAL PACING LEAD PLACEMENT   Anesthesia type: General   Pre-op diagnosis:      TAA     SEVERE AI   Location: [facility] OR ROOM 15 / [facility]   Surgeons: Coralie Common, MD       DISCUSSION: Patient is a 66 year old male scheduled for the above procedure.  History includes never smoker, HTN, CAD, left BBB, PVCs, murmur/severe aortic insufficiency, ascending thoracic aortic aneurysm, LE edema, cognitive impairment ("mild"), expressive speech delay, anxiety, osteoarthritis (right shoulder hemiarthroplasty 08/26/10; left THA 01/26/14; right THA 05/15/14; left shoulder hemiarthroplasty 09/21/17), spinal surgery (L4-S1 laminectomy, L4-5 posterolateral fusion 08/27/11)  [facility] admission 06/25/22-06/30/22. Per Discharge Summary, "Direct admit from [facility] w/ Dr. Oval Linsey. Findings on EKG showed LBBB & 1st degree HB. Echo showed EF 25-30%. Pt w/ new systolic CHF/cardiomyopathy in setting of severe AI/aortic root aneurysm and possible obstructive CAD. On admission, CTA showed severe dilation at the sinus of Valsalva, 67 mm. Moderate dilation of mid ascending aorta, 44 mm. Tricuspid AoV. AHF team asked to see for GDMT optimization prior to surgery.[name] showed well compensated hemodynamics. 60-70% prox RCA lesion otherwise normal cors. GDMT able to be initiated, patient tolerating well. Discussed with TCTS, plan for Ao Root replacement +/- AVR/CABG and epicardial lead placement next Wednesday. EP evaluated and will do the CRT-D after surgery, during admission."   Okay to continue aspirin per CT surgery, hold dose on day of surgery. Last Jardiance 07/06/22.   Anesthesia team to evaluate on the day of surgery. 07/07/22 CXR and COVID-19 test are still in  process. UA results already called to TCTS RN by PAT RN staff.    VS: BP (!) 140/80 Comment: manual  Pulse 63   Temp 36.8 C (Oral)   Resp 18   Ht '5\' 7"'$  (1.702 m)   Wt 72.8 kg   SpO2 99%   BMI 25.14 kg/m    PROVIDERS: Willey Blade, MD is PCP Skeet Latch, MD is primary cardiologist Glori Bickers, MD is HF cardiologist   LABS: Preoperative labs noted. See DISCUSSION. (all labs ordered are listed, but only abnormal results are displayed)  Labs Reviewed  CBC - Abnormal; Notable for the following components:      Result Value   MCV 102.1 (*)    MCH 36.3 (*)    All other components within normal limits  COMPREHENSIVE METABOLIC PANEL - Abnormal; Notable for the following components:   CO2 20 (*)    Glucose, Bld 108 (*)    All other components within normal limits  URINALYSIS, ROUTINE W REFLEX MICROSCOPIC - Abnormal; Notable for the following components:   Glucose, UA >=500 (*)    Nitrite POSITIVE (*)    Leukocytes,Ua SMALL (*)    All other components within normal limits  BLOOD GAS, ARTERIAL - Abnormal; Notable for the following components:   pH, Arterial 7.47 (*)    All other components within normal limits  URINALYSIS, MICROSCOPIC (REFLEX) - Abnormal; Notable for the following components:   Bacteria, UA MANY (*)    All other components within normal limits  SURGICAL PCR SCREEN  SARS CORONAVIRUS 2 (TAT 6-24 HRS)  PROTIME-INR  APTT  HEMOGLOBIN A1C  TYPE AND SCREEN    IMAGES:  CXR 07/07/22: In process.  CT Chest (overread) 06/26/22: IMPRESSION: 1. No acute extracardiac findings. 2. Enlargement of the ascending thoracic aorta and aortic root. Refer to the cardiologist report.   EKG: 06/25/22: Sinus rhythm with 1st degree A-V block with Premature atrial complexes Left bundle branch block Abnormal ECG When compared with ECG of 10-Sep-2017 13:52, LBBB is new Confirmed by Oswaldo Milian [phone]) on 06/26/2022 2:47:14 PM   CV: Cardiac cath  06/29/22: 1.  Moderate proximal RCA stenosis and an angulated portion of the vessel, estimated at 60 to 70% 2.  Patent left main, LAD, and left circumflex with mild luminal irregularities 3.  Low left and right heart filling pressures with preserved cardiac output   Suspect medical therapy will be appropriate for his CAD.  Patient appears to be well diuresed and compensated with respect to his congestive heart failure.    CT Cardiac 06/26/22: IMPRESSION: - Motion and noise artifact, as well as reduced contrast density degrade the diagnostic quality of this exam. 1. Ascending aortic aneurysm with severe dilation at the sinus of Valsalva, 67 mm. Moderate dilation of mid ascending aorta, 44 mm. 2. Probable severe CAD in mid LAD and distal RCA, 70-99% stenosis, CADRADS 4. FFR could not be fully processed due to image quality. 3. Coronary calcium score is 693, which places the patient in the 82nd percentile for age and sex matched control. 4. Normal coronary origins with right dominance. 5. Moderate-severe dilation of main pulmonary artery, 39 mm, suggests pulmonary hypertension. Alternatively in setting of dilated aorta, may indicate vascular pathology. RECOMMENDATIONS: CAD-RADS 4 Severe stenosis. (70-99% or > 50% left main). Cardiac catheterization or CT FFR is recommended. Consider symptom-guided anti-ischemic pharmacotherapy as well as risk factor modification per guideline directed care.   Echo 06/25/22: IMPRESSIONS   1. Left ventricular ejection fraction, by estimation, is 25 to 30%. The  left ventricle has severely decreased function. The left ventricle  demonstrates global hypokinesis. The left ventricular internal cavity size  was severely dilated. There is moderate   concentric left ventricular hypertrophy. Left ventricular diastolic  parameters are consistent with Grade I diastolic dysfunction (impaired  relaxation). The average left ventricular global longitudinal strain is   -7.0 %. The global longitudinal strain is  abnormal. No LV thrombus.   2. Right ventricular systolic function is normal. The right ventricular  size is normal. There is normal pulmonary artery systolic pressure.   3. Left atrial size was severely dilated.   4. Right atrial size was mildly dilated.   5. The mitral valve is normal in structure. Mild mitral valve  regurgitation. No evidence of mitral stenosis.   6. The aortic valve is tricuspid. Aortic valve regurgitation is severe.  There is diatsolic flow reversal (aortic arch asssessment). Eccentric jet  related to root and Sinus of Valsalva dialtiont. Vena contracta 0.8 mm.   7. Pulmonic valve regurgitation is moderate.   8. Aortic dilatation noted. There is severe dilatation of the aortic  root, measuring 65 mm. There is moderate dilatation of the ascending  aorta, measuring 45 mm.   9. The inferior vena cava is normal in size with greater than 50%  respiratory variability, suggesting right atrial pressure of 3 mmHg.  10. Cannot exclude a small PFO.  - Conclusion(s)/Recommendation(s): Severe aortic root dilation with severe  aortic regurgitation, LV dilation and dysfunction appears to be the  sequelae of chronic severe AI. Will reach out to primary cardiologist.    Long-term Hour monitor 05/26/22-05/29/22: Patch Wear Time:  2 days and 23 hours (2023-08-15T14:25:45-0400 to 2023-08-18T14:06:38-0400)   Patient had a min HR of 38 bpm, max HR of 158 bpm, and avg HR of 60 bpm. Predominant underlying rhythm was Sinus Rhythm. First Degree AV Block was present. Bundle Branch Block/IVCD was present. QRS morphology changes were present throughout recording.  Slight P wave morphology changes were noted. 1 run of Ventricular Tachycardia occurred lasting 5 beats with a max rate of 136 bpm (avg 127 bpm). 21 Supraventricular Tachycardia runs occurred, the run with the fastest interval lasting 6 beats with a max  rate of 158 bpm, the longest lasting  16.1 secs with an avg rate of 115 bpm. Junctional Rhythm was present. Isolated SVEs were occasional (3.1%, 7836), SVE Couplets were rare (<1.0%, 728), and SVE Triplets were rare (<1.0%, 179). Isolated VEs were  occasional (3.7%, 9364), VE Couplets were rare (<1.0%, 832), and no VE Triplets were present. Ventricular Bigeminy and Trigeminy were present. Difficulty discerning atrial activity making definitive diagnosis difficult to ascertain.   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   Murmur 05/26/2022   Osteoarthritis of left hip 01/16/2014   Osteoarthritis of right hip 05/15/2014   Primary localized osteoarthrosis of left shoulder 09/21/2017   PVC (premature ventricular contraction) 05/26/2022   Right ureteral stone    Speech impediment    Thoracic ascending aortic aneurysm ([place])    Umbilical hernia     Past Surgical History:  Procedure Laterality Date   BACK SURGERY     CYSTOSCOPY W/ URETERAL STENT REMOVAL Right 04/19/2013   Procedure: CYSTOSCOPY WITH STENT REMOVAL;  Surgeon: Molli Hazard, MD;  Location: [facility];  Service: Urology;  Laterality: Right;   CYSTOSCOPY WITH RETROGRADE PYELOGRAM, URETEROSCOPY AND STENT PLACEMENT Right 04/19/2013   Procedure: CYSTOSCOPY WITH RETROGRADE PYELOGRAM, URETEROSCOPY ;  Surgeon: Molli Hazard, MD;  Location: [facility];  Service: Urology;  Laterality: Right;   CYSTOSCOPY WITH STENT PLACEMENT Right 03/24/2013   Procedure: CYSTOSCOPY WITH STENT PLACEMENT;  Surgeon: Hanley Ben, MD;  Location: [facility];   Service: Urology;  Laterality: Right;   HOLMIUM LASER APPLICATION Right 0/06/3817   Procedure: HOLMIUM LASER APPLICATION;  Surgeon: Molli Hazard, MD;  Location: [facility];  Service: Urology;  Laterality: Right;   INGUINAL HERNIA REPAIR Left 03-04-2005   POSTERIOR LUMBAR FUSION  08-27-2011   L4 -- L5   RIGHT HEART CATH AND CORONARY ANGIOGRAPHY N/A 06/29/2022   Procedure: RIGHT HEART CATH AND CORONARY ANGIOGRAPHY;  Surgeon: Sherren Mocha, MD;  Location: [facility];  Service: Cardiovascular;  Laterality: N/A;   RIGHT SHOULDER HEMIARTHROPLASTY  08-26-2010   OA   SHOULDER HEMI-ARTHROPLASTY Left 09/21/2017   Procedure: SHOULDER HEMI-ARTHROPLASTY;  Surgeon: Marchia Bond, MD;  Location: [facility];  Service: Orthopedics;  Laterality: Left;   TONSILLECTOMY  AS CHILD   TOTAL HIP ARTHROPLASTY Left 01/16/2014   Procedure: TOTAL HIP ARTHROPLASTY;  Surgeon: Johnny Bridge, MD;  Location: [facility];  Service:  Orthopedics;  Laterality: Left;   TOTAL HIP ARTHROPLASTY Right 05/15/2014   Procedure: RIGHT TOTAL HIP ARTHROPLASTY;  Surgeon: Johnny Bridge, MD;  Location: [facility];  Service: Orthopedics;  Laterality: Right;   TOTAL SHOULDER ARTHROPLASTY Right 2011    MEDICATIONS:  aspirin EC 81 MG tablet   atorvastatin (LIPITOR) 40 MG tablet   Cholecalciferol (VITAMIN D3) 250 MCG (10000 UT) capsule   Coenzyme Q10 (CO Q-10) 100 MG CAPS   empagliflozin (JARDIANCE) 10 MG TABS tablet   fluticasone (FLONASE) 50 MCG/ACT nasal spray   furosemide (LASIX) 20 MG tablet   OVER THE COUNTER MEDICATION   sacubitril-valsartan (ENTRESTO) 24-26 MG   spironolactone (ALDACTONE) 25 MG tablet   tamsulosin (FLOMAX) 0.4 MG CAPS capsule   UNABLE TO FIND   UNABLE TO FIND   No current facility-administered medications for this encounter.    [START ON 07/08/2022] ceFAZolin (ANCEF) IVPB 2g/100 mL premix   [START ON 07/08/2022] ceFAZolin (ANCEF) IVPB 2g/100 mL premix   [START ON 07/08/2022] dexmedetomidine  (PRECEDEX) 400 MCG/100ML (4 mcg/mL) infusion   [START ON 07/08/2022] EPINEPHrine (ADRENALIN) 5 mg in NS 250 mL (0.02 mg/mL) premix infusion   [START ON 07/08/2022] heparin 30,000 units/NS 1000 mL solution for CELLSAVER   [START ON 07/08/2022] heparin sodium (porcine) 2,500 Units, papaverine 30 mg in electrolyte-A (PLASMALYTE-A PH 7.4) 500 mL irrigation   [START ON 07/08/2022] insulin regular, human (MYXREDLIN) 100 units/ 100 mL infusion   [START ON 07/08/2022] [place] Blood Cardioplegia vial (lidocaine/magnesium/mannitol 0.26g-4g-6.4g)   [START ON 07/08/2022] milrinone (PRIMACOR) 20 MG/100 ML (0.2 mg/mL) infusion   [START ON 07/08/2022] norepinephrine (LEVOPHED) '4mg'$  in 233m (0.016 mg/mL) premix infusion   [START ON 07/08/2022] phenylephrine (NEO-SYNEPHRINE) '20mg'$ /NS 2559mpremix infusion   [START ON 07/08/2022] potassium chloride injection 80 mEq   [START ON 07/08/2022] tranexamic acid (CYKLOKAPRON) 2,500 mg in sodium chloride 0.9 % 250 mL (10 mg/mL) infusion   [START ON 07/08/2022] tranexamic acid (CYKLOKAPRON) bolus via infusion - over 30 minutes 1,092 mg   [START ON 07/08/2022] tranexamic acid (CYKLOKAPRON) pump prime solution 146 mg   [START ON 07/08/2022] vancomycin (VANCOCIN) 1,000 mg in sodium chloride 0.9 % 1,000 mL irrigation   [START ON 07/08/2022] vancomycin (VANCOREADY) IVPB 1250 mg/250 mL    AlMyra GianottiPA-C Surgical Short Stay/Anesthesiology [facility] ([phone] ([phone]/26/2023 2:53 PM

## 2022-07-07 NOTE — Progress Notes (Addendum)
Notified Dr. Lavonna Monarch and Levonne Spiller that last aspirin dose was 07/06/22 and Jardiance dose was 07/06/22. They informed me to tell the patient that it is okay to take aspirin today but do not take it tomorrow. Ok to proceed based on last jardiance dose.   Messaged allison Z to review chart.

## 2022-07-07 NOTE — H&P (Signed)
Signed      Expand All Collapse All     [address]       [place] [address]             [phone]                                   [place] Medical Record #[id number] Date of Birth: [birth year]   Skeet Latch, MD Willey Blade, MD   Chief Complaint:       Chief Complaint  Patient presents with   Aortic Aneurysm      History of Present Illness:     I have been asked to see this 66 yo wm with a new diagnosis of an aortic root aneurysm and severe AI. Pt reportedly from mother was seen months ago by new NP and heard carotid sound that along with EKG was asked to see cardiology.Pt was evaluated with holter monitor and was asked to start lasix for lower extremity edema which he took PRN. Pt has been very active with bike riding over 5 miles a day. Mother report he has been felling well with no CP or significant DOE just the lower extremity edema. He was seen by Cardiology and an echo was performed with severe AI, severe dilation of the LV, and a 6 cm aortic root aneurysm. Pt was asked to report for futher inpt work up with coronary CTA and now with concern for LAD and RCA stenosis. Pt has remained asymptomatic since admission.           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   Murmur 05/26/2022   Osteoarthritis of left hip 01/16/2014   Osteoarthritis of right hip 05/15/2014   Primary localized osteoarthrosis of left shoulder 09/21/2017   PVC (premature ventricular contraction) 05/26/2022   Right ureteral stone     Speech impediment     Umbilical hernia             Past  Surgical History:  Procedure Laterality Date   BACK SURGERY       CYSTOSCOPY W/ URETERAL STENT REMOVAL Right 04/19/2013    Procedure: CYSTOSCOPY WITH STENT REMOVAL;  Surgeon: Molli Hazard, MD;  Location: [facility];  Service: Urology;  Laterality: Right;   CYSTOSCOPY WITH RETROGRADE PYELOGRAM, URETEROSCOPY AND STENT PLACEMENT Right 04/19/2013    Procedure: CYSTOSCOPY WITH RETROGRADE PYELOGRAM, URETEROSCOPY ;  Surgeon: Molli Hazard, MD;  Location: [facility];  Service: Urology;  Laterality: Right;   CYSTOSCOPY WITH STENT PLACEMENT Right 03/24/2013    Procedure: CYSTOSCOPY WITH STENT PLACEMENT;  Surgeon: Hanley Ben, MD;  Location: [facility];  Service: Urology;  Laterality: Right;   HOLMIUM LASER APPLICATION Right 12/12/2023    Procedure: HOLMIUM LASER APPLICATION;  Surgeon: Molli Hazard, MD;  Location: [facility];  Service: Urology;  Laterality: Right;  INGUINAL HERNIA REPAIR Left 03-04-2005   POSTERIOR LUMBAR FUSION   08-27-2011    L4 -- L5   RIGHT SHOULDER HEMIARTHROPLASTY   08-26-2010    OA   SHOULDER HEMI-ARTHROPLASTY Left 09/21/2017    Procedure: SHOULDER HEMI-ARTHROPLASTY;  Surgeon: Marchia Bond, MD;  Location: [facility];  Service: Orthopedics;  Laterality: Left;   TONSILLECTOMY   AS CHILD   TOTAL HIP ARTHROPLASTY Left 01/16/2014    Procedure: TOTAL HIP ARTHROPLASTY;  Surgeon: Johnny Bridge, MD;  Location: [facility];  Service: Orthopedics;  Laterality: Left;   TOTAL HIP ARTHROPLASTY Right 05/15/2014    Procedure: RIGHT TOTAL HIP ARTHROPLASTY;  Surgeon: Johnny Bridge, MD;  Location: [facility];  Service: Orthopedics;  Laterality: Right;   TOTAL SHOULDER ARTHROPLASTY Right 2011      Social History       Tobacco Use  Smoking Status Never  Smokeless Tobacco Never    Social History       Substance and Sexual Activity  Alcohol Use No      Social History         Socioeconomic History   Marital status: Single       Spouse name: Not on file   Number of children: Not on file   Years of education: Not on file   Highest education level: Not on file  Occupational History   Occupation: unemployed  Tobacco Use   Smoking status: Never   Smokeless tobacco: Never  Vaping Use   Vaping Use: Never used  Substance and Sexual Activity   Alcohol use: No   Drug use: No   Sexual activity: Not Currently  Other Topics Concern   Not on file  Social History Narrative    Pt with delayed mental capabilities, 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  Lives with parents.    Social Determinants of Health        Financial Resource Strain: Low Risk  (10/11/2019)    Overall Financial Resource Strain (CARDIA)     Difficulty of Paying Living Expenses: Not hard at all  Food Insecurity: No Food Insecurity (10/11/2019)    Hunger Vital Sign     Worried About Running Out of Food in the Last Year: Never true     Ran Out of Food in the Last Year: Never true  Transportation Needs: No Transportation Needs (10/11/2019)    PRAPARE - Armed forces logistics/support/administrative officer (Medical): No     Lack of Transportation (Non-Medical): No  Physical Activity: Sufficiently Active (10/11/2019)    Exercise Vital Sign     Days of Exercise per Week: 5 days     Minutes of Exercise per Session: 40 min  Stress: Stress Concern Present (10/11/2019)    [place]     Feeling of Stress : To some extent  Social Connections: Not on file  Intimate Partner Violence: Not on file          Allergies  Allergen Reactions   Codeine Nausea Only   Levaquin [Levofloxacin In D5w] Diarrhea               Current Facility-Administered Medications  Medication Dose Route Frequency Provider Last Rate Last Admin   acetaminophen (TYLENOL) tablet 1,000 mg  1,000 mg Oral Q6H PRN Marykay Lex, MD    1,000 mg at  06/26/22 1927   alum & mag hydroxide-simeth (MAALOX/MYLANTA) 200-200-20 MG/5ML suspension 30 mL  30 mL Oral Q4H PRN Skeet Latch, MD   30 mL at 06/25/22 2131   empagliflozin (JARDIANCE) tablet 10 mg  10 mg Oral Daily Margie Billet, PA-C   10 mg at 06/27/22 0914   fluticasone (FLONASE) 50 MCG/ACT nasal spray 2 spray  2 spray Each Nare Daily Margie Billet, NP   2 spray at 06/27/22 0913   furosemide (LASIX) tablet 20 mg  20 mg Oral PRN Margie Billet, NP       heparin injection 5,000 Units  5,000 Units Subcutaneous Q8H Margie Billet, NP   5,000 Units at 06/27/22 1400   LORazepam (ATIVAN) tablet 1 mg  1 mg Oral QHS Skeet Latch, MD   1 mg at 06/26/22 2213   ondansetron (ZOFRAN) injection 4 mg  4 mg Intravenous Q6H PRN Skeet Latch, MD   4 mg at 06/26/22 1134   sacubitril-valsartan (ENTRESTO) 24-26 mg per tablet  1 tablet Oral BID Margie Billet, PA-C   1 tablet at 06/27/22 1017   senna-docusate (Senokot-S) tablet 1 tablet  1 tablet Oral Daily PRN Skeet Latch, MD   1 tablet at 06/26/22 1931   sodium chloride flush (NS) 0.9 % injection 3 mL  3 mL Intravenous Q12H Margie Billet, PA-C   3 mL at 06/27/22 0914   tamsulosin (FLOMAX) capsule 0.4 mg  0.4 mg Oral BID Margie Billet, NP   0.4 mg at 06/27/22 5102             Family History  Problem Relation Age of Onset   Heart failure Mother     Atrial fibrillation Mother     Heart failure Maternal Grandmother     Atrial fibrillation Maternal Grandmother      STUDIES  I have personally reviewed the following studies and concur with the findings   ECHO 06/25/22 IMPRESSIONS   1. Left ventricular ejection fraction, by estimation, is 25 to 30%. The  left ventricle has severely decreased function. The left ventricle  demonstrates global hypokinesis. The left ventricular internal cavity size  was severely dilated. There is moderate   concentric left ventricular hypertrophy. Left ventricular diastolic  parameters are  consistent with Grade I diastolic dysfunction (impaired  relaxation). The average left ventricular global longitudinal strain is  -7.0 %. The global longitudinal strain is  abnormal. No LV thrombus.   2. Right ventricular systolic function is normal. The right ventricular  size is normal. There is normal pulmonary artery systolic pressure.   3. Left atrial size was severely dilated.   4. Right atrial size was mildly dilated.   5. The mitral valve is normal in structure. Mild mitral valve  regurgitation. No evidence of mitral stenosis.   6. The aortic valve is tricuspid. Aortic valve regurgitation is severe.  There is diatsolic flow reversal (aortic arch asssessment). Eccentric jet  related to root and Sinus of Valsalva dialtiont. Vena contracta 0.8 mm.   7. Pulmonic valve regurgitation is moderate.   8. Aortic dilatation noted. There is severe dilatation of the aortic  root, measuring 65 mm. There is moderate dilatation of the ascending  aorta, measuring 45 mm.   9. The inferior vena cava is normal in size with greater than 50%  respiratory variability, suggesting right atrial pressure of 3 mmHg.  10. Cannot exclude a small PFO.   Conclusion(s)/Recommendation(s): Severe aortic root dilation with severe  aortic regurgitation, LV dilation and dysfunction  appears to be the  sequelae of chronic severe AI. Will reach out to primary cardiologist.   FINDINGS   Left Ventricle: Left ventricular ejection fraction, by estimation, is 25  to 30%. The left ventricle has severely decreased function. The left  ventricle demonstrates global hypokinesis. The average left ventricular  global longitudinal strain is -7.0 %.  The global longitudinal strain is abnormal. 3D left ventricular ejection  fraction analysis performed but not reported based on interpreter  judgement due to suboptimal tracking. The left ventricular internal cavity  size was severely dilated. There is  moderate concentric left  ventricular hypertrophy. Abnormal (paradoxical)  septal motion, consistent with left bundle branch block. Left ventricular  diastolic parameters are consistent with Grade I diastolic dysfunction  (impaired relaxation).   Right Ventricle: The right ventricular size is normal. No increase in  right ventricular wall thickness. Right ventricular systolic function is  normal. There is normal pulmonary artery systolic pressure. The tricuspid  regurgitant velocity is 2.47 m/s, and   with an assumed right atrial pressure of 3 mmHg, the estimated right  ventricular systolic pressure is 31.5 mmHg.   Left Atrium: Left atrial size was severely dilated.   Right Atrium: Right atrial size was mildly dilated.   Pericardium: There is no evidence of pericardial effusion.   Mitral Valve: The mitral valve is normal in structure. Mild mitral valve  regurgitation. No evidence of mitral valve stenosis.   Tricuspid Valve: The tricuspid valve is normal in structure. Tricuspid  valve regurgitation is trivial. No evidence of tricuspid stenosis.   Aortic Valve: The aortic valve is tricuspid. Aortic valve regurgitation is  severe. Aortic regurgitation PHT measures 572 msec.   Pulmonic Valve: The pulmonic valve was normal in structure. Pulmonic valve  regurgitation is moderate. No evidence of pulmonic stenosis.   Aorta: Aortic dilatation noted. There is severe dilatation of the aortic  root, measuring 65 mm. There is moderate dilatation of the ascending  aorta, measuring 45 mm.   Venous: The inferior vena cava is normal in size with greater than 50%  respiratory variability, suggesting right atrial pressure of 3 mmHg.   IAS/Shunts: Cannot exclude a small PFO.    CT Coronary CTA 06/26/22 FINDINGS: Image quality: Average   Noise artifact is: Moderately reduced signal to noise, contrast to noise ratios. Cardiac motion artifact.   AORTA: Severe dilation of the ascending aorta at the sinus of Valsalva. No  dissection. No calcifications.   Measurements made in double oblique technique:   Sinus of Valsalva, measured sinus to sinus technique:   R-L: 66 mm   L-Non: 67 mm   R-Non: 65 mm   STJ: 46 mm   Mid ascending aorta (at PA bifurcation): 44 mm   Coronary calcium score is 693, which places the patient in the 82nd percentile for age and sex matched control.   Coronary arteries: Normal coronary origins.  Right dominance.   Right Coronary Artery: Mild mixed atherosclerotic plaque in the proximal RCA, 25-49% stenosis. Distal RCA is diffusely disease however due to image quality, luminal stenosis cannot be fully evaluated. Possible severe stenosis in the distal RCA, 70-99% stenosis. Which are patent   Left Main Coronary Artery: Minimal mixed atherosclerotic plaque in the ostial left main, <25% stenosis.   Left Anterior Descending Coronary Artery: Probable severe mixed atherosclerotic plaque in the mid LAD, 70-99% stenosis. Moderate calcified plaque in the mid-distal LAD at the level of the bifurcation of the second diagonal, 50-69% stenosis.   Left Circumflex  Artery: No detectable plaque or stenosis. Small OM1 and OM2, with large OM3 and diminutive distal circumflex artery.   Aortic Valve: Trivial calcifications. AV calcium score 99.   Other findings:   Normal pulmonary vein drainage into the left atrium.   Normal left atrial appendage without thrombus.   Moderate-severe dilation of main pulmonary artery, 39 mm.   IMPRESSION: Motion and noise artifact, as well as reduced contrast density degrade the diagnostic quality of this exam.   1. Ascending aortic aneurysm with severe dilation at the sinus of Valsalva, 67 mm. Moderate dilation of mid ascending aorta, 44 mm.   2. Probable severe CAD in mid LAD and distal RCA, 70-99% stenosis, CADRADS 4. FFR could not be fully processed due to image quality.   3. Coronary calcium score is 693, which places the patient in the 82nd  percentile for age and sex matched control.   4. Normal coronary origins with right dominance.   5. Moderate-severe dilation of main pulmonary artery, 39 mm, suggests pulmonary hypertension. Alternatively in setting of dilated aorta, may indicate vascular pathology.     Physical Exam: BP (!) 92/46 (BP Location: Left Arm)   Pulse 66   Temp 97.7 F (36.5 C) (Oral)   Resp 20   Ht '5\' 8"'$  (1.727 m)   Wt 73.6 kg   SpO2 96%   BMI 24.67 kg/m  General: cooperative, engaged HEENT: teeth in relatively good repair Lungs: overall clear Heart: reg with frequent extra sysptolies, loud blowing AI murmur Ext: no edema     Diagnostic Studies & Laboratory data:     Recent Radiology Findings:       Recent Lab Findings: Recent Labs       Lab Results  Component Value Date    WBC 5.3 06/26/2022    HGB 14.7 06/26/2022    HCT 41.7 06/26/2022    PLT 195 06/26/2022    GLUCOSE 105 (H) 06/26/2022    CHOL 203 (H) 10/11/2019    TRIG 79 10/11/2019    HDL 80 10/11/2019    LDLCALC 109 (H) 10/11/2019    ALT 21 06/25/2022    AST 21 06/25/2022    NA 139 06/26/2022    K 4.4 06/26/2022    CL 110 06/26/2022    CREATININE 0.84 06/26/2022    BUN 13 06/26/2022    CO2 20 (L) 06/26/2022    TSH 1.208 12/12/2012    INR 1.1 06/25/2022            Assessment / Plan:      Pt is a 66 yo wm with newly diagnosed severe AI, severe LV dilation and cardiomyopathy with EF 25-30% and with Aortic root aneurysm and possible CAD   Pt will require operative intervention with aortic root replacement and possible CABG which will be at increased risks due to severely dilated LV and cardiomyopathy. Will await cath results and would recommend heart failure evaluation preoperatively to assess further options for medical improvement prior to surgical intervention. I have spent over 60 min in personally reviewing all the studies and notes, discussion with mother and pt of the current situation and need for intervention and  discussion of timing of that operation and in coordination of care           Coralie Common MD 07/07/22

## 2022-07-07 NOTE — Progress Notes (Signed)
PCP - Joelene Millin [place] Cardiologist - Oval Linsey  PPM/ICD - denies   Chest x-ray - 07/07/22 EKG - 06/25/22 Stress Test - denies ECHO - 06/25/22 Cardiac Cath - 06/29/22  Sleep Study - denies  Follow your surgeon's instructions on when to stop Aspirin.  If no instructions were given by your surgeon then you will need to call the office to get those instructions.     ERAS Protcol -no   COVID TEST- 07/07/22   Anesthesia review: yes, CABG  Patient denies shortness of breath, fever, cough and chest pain at PAT appointment   All instructions explained to the patient, with a verbal understanding of the material. Patient agrees to go over the instructions while at home for a better understanding. Patient also instructed to self quarantine after being tested for COVID-19. The opportunity to ask questions was provided.

## 2022-07-08 ENCOUNTER — Inpatient Hospital Stay (HOSPITAL_COMMUNITY)
Admission: RE | Disposition: A | Discharge status: Home Health | Payer: Self-pay | Source: Home / Self Care | Attending: Thoracic Surgery (Cardiothoracic Vascular Surgery)

## 2022-07-08 ENCOUNTER — Inpatient Hospital Stay (HOSPITAL_COMMUNITY): Payer: Medicare Other

## 2022-07-08 ENCOUNTER — Other Ambulatory Visit: Payer: Self-pay

## 2022-07-08 ENCOUNTER — Inpatient Hospital Stay (HOSPITAL_COMMUNITY): Payer: Medicare Other | Admitting: Anesthesiology

## 2022-07-08 ENCOUNTER — Inpatient Hospital Stay (HOSPITAL_COMMUNITY)
Admission: RE | Admit: 2022-07-08 | Discharge: 2022-08-13 | DRG: 219 | Disposition: A | Discharge status: Home Health | Payer: Medicare Other | Attending: Thoracic Surgery (Cardiothoracic Vascular Surgery) | Admitting: Thoracic Surgery (Cardiothoracic Vascular Surgery)

## 2022-07-08 ENCOUNTER — Encounter (HOSPITAL_COMMUNITY): Payer: Self-pay | Admitting: Thoracic Surgery (Cardiothoracic Vascular Surgery)

## 2022-07-08 DIAGNOSIS — I5023 Acute on chronic systolic (congestive) heart failure: Secondary | ICD-10-CM | POA: Diagnosis not present

## 2022-07-08 DIAGNOSIS — F79 Unspecified intellectual disabilities: Secondary | ICD-10-CM | POA: Diagnosis present

## 2022-07-08 DIAGNOSIS — I472 Ventricular tachycardia, unspecified: Secondary | ICD-10-CM | POA: Diagnosis present

## 2022-07-08 DIAGNOSIS — E871 Hypo-osmolality and hyponatremia: Secondary | ICD-10-CM | POA: Diagnosis not present

## 2022-07-08 DIAGNOSIS — K649 Unspecified hemorrhoids: Secondary | ICD-10-CM | POA: Diagnosis present

## 2022-07-08 DIAGNOSIS — Q2543 Congenital aneurysm of aorta: Secondary | ICD-10-CM | POA: Diagnosis not present

## 2022-07-08 DIAGNOSIS — N390 Urinary tract infection, site not specified: Secondary | ICD-10-CM | POA: Diagnosis not present

## 2022-07-08 DIAGNOSIS — Z20822 Contact with and (suspected) exposure to covid-19: Secondary | ICD-10-CM | POA: Diagnosis present

## 2022-07-08 DIAGNOSIS — Z95828 Presence of other vascular implants and grafts: Secondary | ICD-10-CM

## 2022-07-08 DIAGNOSIS — I447 Left bundle-branch block, unspecified: Secondary | ICD-10-CM | POA: Diagnosis present

## 2022-07-08 DIAGNOSIS — N400 Enlarged prostate without lower urinary tract symptoms: Secondary | ICD-10-CM | POA: Diagnosis present

## 2022-07-08 DIAGNOSIS — L8990 Pressure ulcer of unspecified site, unspecified stage: Secondary | ICD-10-CM | POA: Insufficient documentation

## 2022-07-08 DIAGNOSIS — I429 Cardiomyopathy, unspecified: Secondary | ICD-10-CM | POA: Diagnosis not present

## 2022-07-08 DIAGNOSIS — R570 Cardiogenic shock: Secondary | ICD-10-CM | POA: Diagnosis present

## 2022-07-08 DIAGNOSIS — Z96643 Presence of artificial hip joint, bilateral: Secondary | ICD-10-CM | POA: Diagnosis present

## 2022-07-08 DIAGNOSIS — M1990 Unspecified osteoarthritis, unspecified site: Secondary | ICD-10-CM | POA: Diagnosis not present

## 2022-07-08 DIAGNOSIS — J9601 Acute respiratory failure with hypoxia: Secondary | ICD-10-CM | POA: Diagnosis not present

## 2022-07-08 DIAGNOSIS — I428 Other cardiomyopathies: Secondary | ICD-10-CM | POA: Diagnosis present

## 2022-07-08 DIAGNOSIS — I110 Hypertensive heart disease with heart failure: Secondary | ICD-10-CM | POA: Diagnosis present

## 2022-07-08 DIAGNOSIS — I2510 Atherosclerotic heart disease of native coronary artery without angina pectoris: Secondary | ICD-10-CM | POA: Diagnosis present

## 2022-07-08 DIAGNOSIS — I498 Other specified cardiac arrhythmias: Secondary | ICD-10-CM

## 2022-07-08 DIAGNOSIS — K567 Ileus, unspecified: Secondary | ICD-10-CM | POA: Diagnosis not present

## 2022-07-08 DIAGNOSIS — D696 Thrombocytopenia, unspecified: Secondary | ICD-10-CM | POA: Diagnosis present

## 2022-07-08 DIAGNOSIS — I7121 Aneurysm of the ascending aorta, without rupture: Principal | ICD-10-CM | POA: Diagnosis present

## 2022-07-08 DIAGNOSIS — A419 Sepsis, unspecified organism: Secondary | ICD-10-CM | POA: Diagnosis not present

## 2022-07-08 DIAGNOSIS — T82868A Thrombosis of vascular prosthetic devices, implants and grafts, initial encounter: Secondary | ICD-10-CM | POA: Diagnosis not present

## 2022-07-08 DIAGNOSIS — E876 Hypokalemia: Secondary | ICD-10-CM | POA: Diagnosis present

## 2022-07-08 DIAGNOSIS — R609 Edema, unspecified: Secondary | ICD-10-CM | POA: Diagnosis not present

## 2022-07-08 DIAGNOSIS — I4891 Unspecified atrial fibrillation: Secondary | ICD-10-CM | POA: Diagnosis present

## 2022-07-08 DIAGNOSIS — R011 Cardiac murmur, unspecified: Secondary | ICD-10-CM | POA: Diagnosis not present

## 2022-07-08 DIAGNOSIS — Z9911 Dependence on respirator [ventilator] status: Secondary | ICD-10-CM | POA: Diagnosis not present

## 2022-07-08 DIAGNOSIS — R579 Shock, unspecified: Secondary | ICD-10-CM

## 2022-07-08 DIAGNOSIS — I5032 Chronic diastolic (congestive) heart failure: Secondary | ICD-10-CM | POA: Diagnosis not present

## 2022-07-08 DIAGNOSIS — I10 Essential (primary) hypertension: Secondary | ICD-10-CM | POA: Diagnosis not present

## 2022-07-08 DIAGNOSIS — I4892 Unspecified atrial flutter: Secondary | ICD-10-CM | POA: Diagnosis not present

## 2022-07-08 DIAGNOSIS — E878 Other disorders of electrolyte and fluid balance, not elsewhere classified: Secondary | ICD-10-CM | POA: Diagnosis not present

## 2022-07-08 DIAGNOSIS — R079 Chest pain, unspecified: Secondary | ICD-10-CM | POA: Diagnosis not present

## 2022-07-08 DIAGNOSIS — I82621 Acute embolism and thrombosis of deep veins of right upper extremity: Secondary | ICD-10-CM | POA: Diagnosis present

## 2022-07-08 DIAGNOSIS — Z951 Presence of aortocoronary bypass graft: Secondary | ICD-10-CM | POA: Diagnosis not present

## 2022-07-08 DIAGNOSIS — R0602 Shortness of breath: Secondary | ICD-10-CM | POA: Diagnosis present

## 2022-07-08 DIAGNOSIS — I712 Thoracic aortic aneurysm, without rupture, unspecified: Secondary | ICD-10-CM

## 2022-07-08 DIAGNOSIS — Z96611 Presence of right artificial shoulder joint: Secondary | ICD-10-CM | POA: Diagnosis present

## 2022-07-08 DIAGNOSIS — E873 Alkalosis: Secondary | ICD-10-CM | POA: Diagnosis present

## 2022-07-08 DIAGNOSIS — I351 Nonrheumatic aortic (valve) insufficiency: Secondary | ICD-10-CM

## 2022-07-08 DIAGNOSIS — T502X5A Adverse effect of carbonic-anhydrase inhibitors, benzothiadiazides and other diuretics, initial encounter: Secondary | ICD-10-CM | POA: Diagnosis not present

## 2022-07-08 DIAGNOSIS — N3289 Other specified disorders of bladder: Secondary | ICD-10-CM | POA: Diagnosis not present

## 2022-07-08 DIAGNOSIS — Z79899 Other long term (current) drug therapy: Secondary | ICD-10-CM

## 2022-07-08 DIAGNOSIS — Y848 Other medical procedures as the cause of abnormal reaction of the patient, or of later complication, without mention of misadventure at the time of the procedure: Secondary | ICD-10-CM | POA: Diagnosis not present

## 2022-07-08 DIAGNOSIS — I350 Nonrheumatic aortic (valve) stenosis: Secondary | ICD-10-CM | POA: Diagnosis not present

## 2022-07-08 DIAGNOSIS — R739 Hyperglycemia, unspecified: Secondary | ICD-10-CM | POA: Diagnosis not present

## 2022-07-08 DIAGNOSIS — Z8249 Family history of ischemic heart disease and other diseases of the circulatory system: Secondary | ICD-10-CM

## 2022-07-08 DIAGNOSIS — I440 Atrioventricular block, first degree: Secondary | ICD-10-CM | POA: Diagnosis present

## 2022-07-08 DIAGNOSIS — I714 Abdominal aortic aneurysm, without rupture, unspecified: Secondary | ICD-10-CM | POA: Diagnosis not present

## 2022-07-08 DIAGNOSIS — K5903 Drug induced constipation: Secondary | ICD-10-CM

## 2022-07-08 DIAGNOSIS — Z7901 Long term (current) use of anticoagulants: Secondary | ICD-10-CM

## 2022-07-08 DIAGNOSIS — E875 Hyperkalemia: Secondary | ICD-10-CM | POA: Diagnosis not present

## 2022-07-08 DIAGNOSIS — R52 Pain, unspecified: Secondary | ICD-10-CM | POA: Diagnosis not present

## 2022-07-08 DIAGNOSIS — Z7982 Long term (current) use of aspirin: Secondary | ICD-10-CM

## 2022-07-08 DIAGNOSIS — F4321 Adjustment disorder with depressed mood: Secondary | ICD-10-CM | POA: Diagnosis present

## 2022-07-08 DIAGNOSIS — I719 Aortic aneurysm of unspecified site, without rupture: Secondary | ICD-10-CM

## 2022-07-08 DIAGNOSIS — I493 Ventricular premature depolarization: Secondary | ICD-10-CM | POA: Diagnosis not present

## 2022-07-08 DIAGNOSIS — Z885 Allergy status to narcotic agent status: Secondary | ICD-10-CM

## 2022-07-08 DIAGNOSIS — D62 Acute posthemorrhagic anemia: Secondary | ICD-10-CM | POA: Diagnosis not present

## 2022-07-08 DIAGNOSIS — I5022 Chronic systolic (congestive) heart failure: Secondary | ICD-10-CM | POA: Diagnosis not present

## 2022-07-08 DIAGNOSIS — K5900 Constipation, unspecified: Secondary | ICD-10-CM | POA: Diagnosis not present

## 2022-07-08 HISTORY — PX: CORONARY ARTERY BYPASS GRAFT: SHX141

## 2022-07-08 HISTORY — PX: TEE WITHOUT CARDIOVERSION: SHX5443

## 2022-07-08 HISTORY — PX: EPICARDIAL PACING LEAD PLACEMENT: SHX6274

## 2022-07-08 HISTORY — PX: ASCENDING AORTIC ROOT REPLACEMENT: SHX5729

## 2022-07-08 LAB — POCT I-STAT, CHEM 8
BUN: 14 mg/dL (ref 8–23)
BUN: 14 mg/dL (ref 8–23)
BUN: 13 mg/dL (ref 8–23)
BUN: 13 mg/dL (ref 8–23)
BUN: 12 mg/dL (ref 8–23)
Calcium, Ion: 1.26 mmol/L (ref 1.15–1.40)
Calcium, Ion: 1.26 mmol/L (ref 1.15–1.40)
Calcium, Ion: 1.14 mmol/L — ABNORMAL LOW (ref 1.15–1.40)
Calcium, Ion: 1.08 mmol/L — ABNORMAL LOW (ref 1.15–1.40)
Calcium, Ion: 1.01 mmol/L — ABNORMAL LOW (ref 1.15–1.40)
Chloride: 104 mmol/L (ref 98–111)
Chloride: 104 mmol/L (ref 98–111)
Chloride: 106 mmol/L (ref 98–111)
Chloride: 104 mmol/L (ref 98–111)
Chloride: 103 mmol/L (ref 98–111)
Creatinine, Ser: 0.7 mg/dL (ref 0.61–1.24)
Creatinine, Ser: 0.7 mg/dL (ref 0.61–1.24)
Creatinine, Ser: 0.6 mg/dL — ABNORMAL LOW (ref 0.61–1.24)
Creatinine, Ser: 0.6 mg/dL — ABNORMAL LOW (ref 0.61–1.24)
Creatinine, Ser: 0.6 mg/dL — ABNORMAL LOW (ref 0.61–1.24)
Glucose, Bld: 101 mg/dL — ABNORMAL HIGH (ref 70–99)
Glucose, Bld: 130 mg/dL — ABNORMAL HIGH (ref 70–99)
Glucose, Bld: 145 mg/dL — ABNORMAL HIGH (ref 70–99)
Glucose, Bld: 135 mg/dL — ABNORMAL HIGH (ref 70–99)
Glucose, Bld: 151 mg/dL — ABNORMAL HIGH (ref 70–99)
HCT: 37 % — ABNORMAL LOW (ref 39.0–52.0)
HCT: 37 % — ABNORMAL LOW (ref 39.0–52.0)
HCT: 34 % — ABNORMAL LOW (ref 39.0–52.0)
HCT: 33 % — ABNORMAL LOW (ref 39.0–52.0)
HCT: 30 % — ABNORMAL LOW (ref 39.0–52.0)
Hemoglobin: 12.6 g/dL — ABNORMAL LOW (ref 13.0–17.0)
Hemoglobin: 12.6 g/dL — ABNORMAL LOW (ref 13.0–17.0)
Hemoglobin: 11.6 g/dL — ABNORMAL LOW (ref 13.0–17.0)
Hemoglobin: 11.2 g/dL — ABNORMAL LOW (ref 13.0–17.0)
Hemoglobin: 10.2 g/dL — ABNORMAL LOW (ref 13.0–17.0)
Potassium: 3.9 mmol/L (ref 3.5–5.1)
Potassium: 4.1 mmol/L (ref 3.5–5.1)
Potassium: 5.3 mmol/L — ABNORMAL HIGH (ref 3.5–5.1)
Potassium: 4.8 mmol/L (ref 3.5–5.1)
Potassium: 4.6 mmol/L (ref 3.5–5.1)
Sodium: 137 mmol/L (ref 135–145)
Sodium: 137 mmol/L (ref 135–145)
Sodium: 137 mmol/L (ref 135–145)
Sodium: 135 mmol/L (ref 135–145)
Sodium: 137 mmol/L (ref 135–145)
TCO2: 22 mmol/L (ref 22–32)
TCO2: 23 mmol/L (ref 22–32)
TCO2: 26 mmol/L (ref 22–32)
TCO2: 24 mmol/L (ref 22–32)
TCO2: 22 mmol/L (ref 22–32)

## 2022-07-08 LAB — POCT I-STAT 7, (LYTES, BLD GAS, ICA,H+H)
Acid-Base Excess: 0 mmol/L (ref 0.0–2.0)
Acid-Base Excess: 1 mmol/L (ref 0.0–2.0)
Acid-base deficit: 1 mmol/L (ref 0.0–2.0)
Acid-base deficit: 4 mmol/L — ABNORMAL HIGH (ref 0.0–2.0)
Acid-base deficit: 3 mmol/L — ABNORMAL HIGH (ref 0.0–2.0)
Acid-base deficit: 5 mmol/L — ABNORMAL HIGH (ref 0.0–2.0)
Bicarbonate: 25.5 mmol/L (ref 20.0–28.0)
Bicarbonate: 25.2 mmol/L (ref 20.0–28.0)
Bicarbonate: 25.7 mmol/L (ref 20.0–28.0)
Bicarbonate: 22.7 mmol/L (ref 20.0–28.0)
Bicarbonate: 20.9 mmol/L (ref 20.0–28.0)
Bicarbonate: 19.9 mmol/L — ABNORMAL LOW (ref 20.0–28.0)
Calcium, Ion: 1.06 mmol/L — ABNORMAL LOW (ref 1.15–1.40)
Calcium, Ion: 1.13 mmol/L — ABNORMAL LOW (ref 1.15–1.40)
Calcium, Ion: 1.05 mmol/L — ABNORMAL LOW (ref 1.15–1.40)
Calcium, Ion: 1.12 mmol/L — ABNORMAL LOW (ref 1.15–1.40)
Calcium, Ion: 1.08 mmol/L — ABNORMAL LOW (ref 1.15–1.40)
Calcium, Ion: 1.14 mmol/L — ABNORMAL LOW (ref 1.15–1.40)
HCT: 30 % — ABNORMAL LOW (ref 39.0–52.0)
HCT: 33 % — ABNORMAL LOW (ref 39.0–52.0)
HCT: 31 % — ABNORMAL LOW (ref 39.0–52.0)
HCT: 33 % — ABNORMAL LOW (ref 39.0–52.0)
HCT: 24 % — ABNORMAL LOW (ref 39.0–52.0)
HCT: 25 % — ABNORMAL LOW (ref 39.0–52.0)
Hemoglobin: 10.2 g/dL — ABNORMAL LOW (ref 13.0–17.0)
Hemoglobin: 11.2 g/dL — ABNORMAL LOW (ref 13.0–17.0)
Hemoglobin: 10.5 g/dL — ABNORMAL LOW (ref 13.0–17.0)
Hemoglobin: 11.2 g/dL — ABNORMAL LOW (ref 13.0–17.0)
Hemoglobin: 8.2 g/dL — ABNORMAL LOW (ref 13.0–17.0)
Hemoglobin: 8.5 g/dL — ABNORMAL LOW (ref 13.0–17.0)
O2 Saturation: 100 %
O2 Saturation: 100 %
O2 Saturation: 100 %
O2 Saturation: 98 %
O2 Saturation: 99 %
O2 Saturation: 97 %
Patient temperature: 34.9
Patient temperature: 37.6
Patient temperature: 36.9
Potassium: 4.4 mmol/L (ref 3.5–5.1)
Potassium: 4.7 mmol/L (ref 3.5–5.1)
Potassium: 5.2 mmol/L — ABNORMAL HIGH (ref 3.5–5.1)
Potassium: 3.8 mmol/L (ref 3.5–5.1)
Potassium: 5 mmol/L (ref 3.5–5.1)
Potassium: 4.8 mmol/L (ref 3.5–5.1)
Sodium: 139 mmol/L (ref 135–145)
Sodium: 138 mmol/L (ref 135–145)
Sodium: 136 mmol/L (ref 135–145)
Sodium: 138 mmol/L (ref 135–145)
Sodium: 139 mmol/L (ref 135–145)
Sodium: 139 mmol/L (ref 135–145)
TCO2: 27 mmol/L (ref 22–32)
TCO2: 26 mmol/L (ref 22–32)
TCO2: 27 mmol/L (ref 22–32)
TCO2: 24 mmol/L (ref 22–32)
TCO2: 22 mmol/L (ref 22–32)
TCO2: 21 mmol/L — ABNORMAL LOW (ref 22–32)
pCO2 arterial: 49.4 mmHg — ABNORMAL HIGH (ref 32–48)
pCO2 arterial: 41.4 mmHg (ref 32–48)
pCO2 arterial: 38.8 mmHg (ref 32–48)
pCO2 arterial: 43.5 mmHg (ref 32–48)
pCO2 arterial: 33 mmHg (ref 32–48)
pCO2 arterial: 35.7 mmHg (ref 32–48)
pH, Arterial: 7.322 — ABNORMAL LOW (ref 7.35–7.45)
pH, Arterial: 7.393 (ref 7.35–7.45)
pH, Arterial: 7.429 (ref 7.35–7.45)
pH, Arterial: 7.315 — ABNORMAL LOW (ref 7.35–7.45)
pH, Arterial: 7.411 (ref 7.35–7.45)
pH, Arterial: 7.353 (ref 7.35–7.45)
pO2, Arterial: 375 mmHg — ABNORMAL HIGH (ref 83–108)
pO2, Arterial: 353 mmHg — ABNORMAL HIGH (ref 83–108)
pO2, Arterial: 283 mmHg — ABNORMAL HIGH (ref 83–108)
pO2, Arterial: 114 mmHg — ABNORMAL HIGH (ref 83–108)
pO2, Arterial: 141 mmHg — ABNORMAL HIGH (ref 83–108)
pO2, Arterial: 94 mmHg (ref 83–108)

## 2022-07-08 LAB — GLUCOSE, CAPILLARY
Glucose-Capillary: 140 mg/dL — ABNORMAL HIGH (ref 70–99)
Glucose-Capillary: 139 mg/dL — ABNORMAL HIGH (ref 70–99)
Glucose-Capillary: 118 mg/dL — ABNORMAL HIGH (ref 70–99)
Glucose-Capillary: 120 mg/dL — ABNORMAL HIGH (ref 70–99)
Glucose-Capillary: 122 mg/dL — ABNORMAL HIGH (ref 70–99)
Glucose-Capillary: 125 mg/dL — ABNORMAL HIGH (ref 70–99)
Glucose-Capillary: 137 mg/dL — ABNORMAL HIGH (ref 70–99)
Glucose-Capillary: 111 mg/dL — ABNORMAL HIGH (ref 70–99)

## 2022-07-08 LAB — BASIC METABOLIC PANEL
Anion gap: 6 (ref 5–15)
BUN: 11 mg/dL (ref 8–23)
CO2: 22 mmol/L (ref 22–32)
Calcium: 7.6 mg/dL — ABNORMAL LOW (ref 8.9–10.3)
Chloride: 111 mmol/L (ref 98–111)
Creatinine, Ser: 0.94 mg/dL (ref 0.61–1.24)
GFR, Estimated: >60 mL/min (ref >60)
Glucose, Bld: 117 mg/dL — ABNORMAL HIGH (ref 70–99)
Potassium: 4.8 mmol/L (ref 3.5–5.1)
Sodium: 139 mmol/L (ref 135–145)

## 2022-07-08 LAB — CBC
HCT: 32.5 % — ABNORMAL LOW (ref 39.0–52.0)
HCT: 26.1 % — ABNORMAL LOW (ref 39.0–52.0)
Hemoglobin: 11.4 g/dL — ABNORMAL LOW (ref 13.0–17.0)
Hemoglobin: 9.3 g/dL — ABNORMAL LOW (ref 13.0–17.0)
MCH: 36.2 pg — ABNORMAL HIGH (ref 26.0–34.0)
MCH: 36.6 pg — ABNORMAL HIGH (ref 26.0–34.0)
MCHC: 35.1 g/dL (ref 30.0–36.0)
MCHC: 35.6 g/dL (ref 30.0–36.0)
MCV: 103.2 fL — ABNORMAL HIGH (ref 80.0–100.0)
MCV: 102.8 fL — ABNORMAL HIGH (ref 80.0–100.0)
Platelets: 134 10*3/uL — ABNORMAL LOW (ref 150–400)
Platelets: 100 10*3/uL — ABNORMAL LOW (ref 150–400)
RBC: 3.15 MIL/uL — ABNORMAL LOW (ref 4.22–5.81)
RBC: 2.54 MIL/uL — ABNORMAL LOW (ref 4.22–5.81)
RDW: 12.7 % (ref 11.5–15.5)
RDW: 12.7 % (ref 11.5–15.5)
WBC: 16.5 10*3/uL — ABNORMAL HIGH (ref 4.0–10.5)
WBC: 12.6 10*3/uL — ABNORMAL HIGH (ref 4.0–10.5)
nRBC: 0 % (ref 0.0–0.2)
nRBC: 0 % (ref 0.0–0.2)

## 2022-07-08 LAB — PROTIME-INR
INR: 1.7 — ABNORMAL HIGH (ref 0.8–1.2)
Prothrombin Time: 20.1 seconds — ABNORMAL HIGH (ref 11.4–15.2)

## 2022-07-08 LAB — POCT I-STAT EG7
Acid-base deficit: 2 mmol/L (ref 0.0–2.0)
Bicarbonate: 25.1 mmol/L (ref 20.0–28.0)
Calcium, Ion: 1.14 mmol/L — ABNORMAL LOW (ref 1.15–1.40)
HCT: 32 % — ABNORMAL LOW (ref 39.0–52.0)
Hemoglobin: 10.9 g/dL — ABNORMAL LOW (ref 13.0–17.0)
O2 Saturation: 77 %
Potassium: 4 mmol/L (ref 3.5–5.1)
Sodium: 139 mmol/L (ref 135–145)
TCO2: 27 mmol/L (ref 22–32)
pCO2, Ven: 53 mmHg (ref 44–60)
pH, Ven: 7.283 (ref 7.25–7.43)
pO2, Ven: 47 mmHg — ABNORMAL HIGH (ref 32–45)

## 2022-07-08 LAB — ECHO INTRAOPERATIVE TEE
Height: 67 in
Weight: 2512 oz

## 2022-07-08 LAB — APTT: aPTT: 43 seconds — ABNORMAL HIGH (ref 24–36)

## 2022-07-08 LAB — HEMOGLOBIN AND HEMATOCRIT, BLOOD
HCT: 32.3 % — ABNORMAL LOW (ref 39.0–52.0)
Hemoglobin: 11.9 g/dL — ABNORMAL LOW (ref 13.0–17.0)

## 2022-07-08 LAB — SARS CORONAVIRUS 2 (TAT 6-24 HRS): SARS Coronavirus 2: NEGATIVE

## 2022-07-08 LAB — MAGNESIUM: Magnesium: 3 mg/dL — ABNORMAL HIGH (ref 1.7–2.4)

## 2022-07-08 LAB — PLATELET COUNT: Platelets: 136 10*3/uL — ABNORMAL LOW (ref 150–400)

## 2022-07-08 SURGERY — ASCENDING AORTIC ROOT REPLACEMENT
Anesthesia: General | Site: Chest

## 2022-07-08 MED ORDER — FENTANYL CITRATE (PF) 250 MCG/5ML IJ SOLN
INTRAMUSCULAR | Status: AC
  Filled 2022-07-08: qty 5

## 2022-07-08 MED ORDER — NOREPINEPHRINE 4 MG/250ML-% IV SOLN
0.0000 ug/min | INTRAVENOUS | Status: DC
  Administered 2022-07-08: 8 ug/min via INTRAVENOUS
  Administered 2022-07-09: 10 ug/min via INTRAVENOUS
  Filled 2022-07-08 (×2): qty 250

## 2022-07-08 MED ORDER — FENTANYL CITRATE (PF) 250 MCG/5ML IJ SOLN
INTRAMUSCULAR | Status: DC | PRN
  Administered 2022-07-08 (×2): 100 ug via INTRAVENOUS
  Administered 2022-07-08 (×2): 50 ug via INTRAVENOUS
  Administered 2022-07-08: 100 ug via INTRAVENOUS
  Administered 2022-07-08: 75 ug via INTRAVENOUS
  Administered 2022-07-08: 50 ug via INTRAVENOUS
  Administered 2022-07-08: 25 ug via INTRAVENOUS
  Administered 2022-07-08: 50 ug via INTRAVENOUS
  Administered 2022-07-08: 100 ug via INTRAVENOUS
  Administered 2022-07-08 (×2): 50 ug via INTRAVENOUS

## 2022-07-08 MED ORDER — ATORVASTATIN CALCIUM 40 MG PO TABS
40.0000 mg | ORAL_TABLET | Freq: Every day | ORAL | Status: DC
  Administered 2022-07-09: 40 mg via ORAL
  Filled 2022-07-08: qty 1

## 2022-07-08 MED ORDER — ROCURONIUM BROMIDE 10 MG/ML (PF) SYRINGE
PREFILLED_SYRINGE | INTRAVENOUS | Status: DC | PRN
  Administered 2022-07-08: 40 mg via INTRAVENOUS
  Administered 2022-07-08: 100 mg via INTRAVENOUS
  Administered 2022-07-08 (×2): 40 mg via INTRAVENOUS

## 2022-07-08 MED ORDER — ACETAMINOPHEN 650 MG RE SUPP
650.0000 mg | Freq: Once | RECTAL | Status: AC
  Administered 2022-07-08: 650 mg via RECTAL

## 2022-07-08 MED ORDER — SODIUM CHLORIDE 0.9 % IV SOLN: 250.0000 mL | INTRAVENOUS | Status: DC

## 2022-07-08 MED ORDER — MIDAZOLAM HCL (PF) 5 MG/ML IJ SOLN
INTRAMUSCULAR | Status: DC | PRN
  Administered 2022-07-08: 6 mg via INTRAVENOUS
  Administered 2022-07-08 (×4): 1 mg via INTRAVENOUS

## 2022-07-08 MED ORDER — SODIUM CHLORIDE 0.9 % IV SOLN: INTRAVENOUS | Status: DC

## 2022-07-08 MED ORDER — HEMOSTATIC AGENTS (NO CHARGE) OPTIME
TOPICAL | Status: DC | PRN
  Administered 2022-07-08: 1 via TOPICAL

## 2022-07-08 MED ORDER — VANCOMYCIN HCL IN DEXTROSE 1-5 GM/200ML-% IV SOLN
1000.0000 mg | Freq: Once | INTRAVENOUS | Status: AC
  Administered 2022-07-08: 1000 mg via INTRAVENOUS
  Filled 2022-07-08: qty 200

## 2022-07-08 MED ORDER — CLEVIDIPINE BUTYRATE 0.5 MG/ML IV EMUL
0.0000 mg/h | INTRAVENOUS | Status: DC
  Filled 2022-07-08: qty 50

## 2022-07-08 MED ORDER — TAMSULOSIN HCL 0.4 MG PO CAPS
0.4000 mg | ORAL_CAPSULE | Freq: Two times a day (BID) | ORAL | Status: DC
  Administered 2022-07-09 – 2022-08-13 (×71): 0.4 mg via ORAL
  Filled 2022-07-08 (×71): qty 1

## 2022-07-08 MED ORDER — LACTATED RINGERS IV SOLN
500.0000 mL | Freq: Once | INTRAVENOUS | Status: AC | PRN
  Administered 2022-07-09: 500 mL via INTRAVENOUS

## 2022-07-08 MED ORDER — ALBUMIN HUMAN 5 % IV SOLN
250.0000 mL | INTRAVENOUS | Status: AC | PRN
  Administered 2022-07-08 (×3): 12.5 g via INTRAVENOUS
  Filled 2022-07-08 (×2): qty 250

## 2022-07-08 MED ORDER — EPINEPHRINE HCL 5 MG/250ML IV SOLN IN NS: 0.0000 ug/min | INTRAVENOUS | Status: DC

## 2022-07-08 MED ORDER — METOPROLOL TARTRATE 12.5 MG HALF TABLET
12.5000 mg | ORAL_TABLET | Freq: Once | ORAL | Status: DC
  Filled 2022-07-08: qty 1

## 2022-07-08 MED ORDER — LACTATED RINGERS IV SOLN: INTRAVENOUS | Status: DC

## 2022-07-08 MED ORDER — METOPROLOL TARTRATE 12.5 MG HALF TABLET: 12.5000 mg | ORAL_TABLET | Freq: Two times a day (BID) | ORAL | Status: DC

## 2022-07-08 MED ORDER — ALBUMIN HUMAN 5 % IV SOLN: INTRAVENOUS | Status: DC | PRN

## 2022-07-08 MED ORDER — TRAMADOL HCL 50 MG PO TABS
50.0000 mg | ORAL_TABLET | ORAL | Status: DC | PRN
  Administered 2022-07-08 – 2022-07-09 (×2): 50 mg via ORAL
  Administered 2022-07-09 – 2022-07-17 (×4): 100 mg via ORAL
  Administered 2022-07-19: 50 mg via ORAL
  Administered 2022-07-20: 100 mg via ORAL
  Administered 2022-07-21: 50 mg via ORAL
  Filled 2022-07-08 (×2): qty 1
  Filled 2022-07-08 (×3): qty 2
  Filled 2022-07-08: qty 1
  Filled 2022-07-08: qty 2
  Filled 2022-07-08: qty 1
  Filled 2022-07-08 (×2): qty 2

## 2022-07-08 MED ORDER — MAGNESIUM SULFATE 4 GM/100ML IV SOLN
4.0000 g | Freq: Once | INTRAVENOUS | Status: AC
  Administered 2022-07-08: 4 g via INTRAVENOUS
  Filled 2022-07-08: qty 100

## 2022-07-08 MED ORDER — POTASSIUM CHLORIDE 10 MEQ/50ML IV SOLN
10.0000 meq | INTRAVENOUS | Status: AC
  Administered 2022-07-08 (×3): 10 meq via INTRAVENOUS

## 2022-07-08 MED ORDER — DEXTROSE 50 % IV SOLN: 0.0000 mL | INTRAVENOUS | Status: DC | PRN

## 2022-07-08 MED ORDER — METOPROLOL TARTRATE 25 MG/10 ML ORAL SUSPENSION: 12.5000 mg | Freq: Two times a day (BID) | ORAL | Status: DC

## 2022-07-08 MED ORDER — OXYCODONE HCL 5 MG PO TABS
5.0000 mg | ORAL_TABLET | ORAL | Status: DC | PRN
  Administered 2022-07-08 – 2022-07-13 (×5): 10 mg via ORAL
  Administered 2022-07-14 – 2022-07-19 (×6): 5 mg via ORAL
  Filled 2022-07-08 (×2): qty 1
  Filled 2022-07-08 (×2): qty 2
  Filled 2022-07-08 (×3): qty 1
  Filled 2022-07-08: qty 2
  Filled 2022-07-08 (×3): qty 1
  Filled 2022-07-08 (×2): qty 2

## 2022-07-08 MED ORDER — FAMOTIDINE IN NACL 20-0.9 MG/50ML-% IV SOLN
20.0000 mg | Freq: Two times a day (BID) | INTRAVENOUS | Status: DC
  Administered 2022-07-08: 20 mg via INTRAVENOUS
  Filled 2022-07-08: qty 50

## 2022-07-08 MED ORDER — LACTATED RINGERS IV SOLN: INTRAVENOUS | Status: DC | PRN

## 2022-07-08 MED ORDER — MORPHINE SULFATE (PF) 2 MG/ML IV SOLN
1.0000 mg | INTRAVENOUS | Status: DC | PRN
  Administered 2022-07-08 – 2022-07-10 (×2): 2 mg via INTRAVENOUS
  Filled 2022-07-08 (×2): qty 1

## 2022-07-08 MED ORDER — ORAL CARE MOUTH RINSE: 15.0000 mL | Freq: Once | OROMUCOSAL | Status: AC

## 2022-07-08 MED ORDER — ACETAMINOPHEN 160 MG/5ML PO SOLN: 1000.0000 mg | Freq: Four times a day (QID) | ORAL | Status: AC

## 2022-07-08 MED ORDER — CALCIUM CHLORIDE 10 % IV SOLN
INTRAVENOUS | Status: DC | PRN
  Administered 2022-07-08: 50 mg via INTRAVENOUS

## 2022-07-08 MED ORDER — PHENYLEPHRINE HCL-NACL 20-0.9 MG/250ML-% IV SOLN: 0.0000 ug/min | INTRAVENOUS | Status: DC

## 2022-07-08 MED ORDER — SODIUM CHLORIDE 0.9 % IV SOLN: INTRAVENOUS | Status: DC | PRN

## 2022-07-08 MED ORDER — CHLORHEXIDINE GLUCONATE 0.12 % MT SOLN
15.0000 mL | Freq: Once | OROMUCOSAL | Status: AC
  Administered 2022-07-08: 15 mL via OROMUCOSAL
  Filled 2022-07-08: qty 15

## 2022-07-08 MED ORDER — VASOPRESSIN 20 UNITS/100 ML INFUSION FOR SHOCK
0.0000 [IU]/min | INTRAVENOUS | Status: DC
  Administered 2022-07-08: 0.03 [IU]/min via INTRAVENOUS
  Filled 2022-07-08: qty 100

## 2022-07-08 MED ORDER — SODIUM CHLORIDE 0.9% FLUSH
3.0000 mL | Freq: Two times a day (BID) | INTRAVENOUS | Status: DC
  Administered 2022-07-09 – 2022-08-12 (×44): 3 mL via INTRAVENOUS

## 2022-07-08 MED ORDER — VANCOMYCIN HCL 1000 MG IV SOLR
INTRAVENOUS | Status: DC | PRN
  Administered 2022-07-08: 1000 mL

## 2022-07-08 MED ORDER — ONDANSETRON HCL 4 MG/2ML IJ SOLN
4.0000 mg | Freq: Four times a day (QID) | INTRAMUSCULAR | Status: DC | PRN
  Administered 2022-07-08 – 2022-08-03 (×11): 4 mg via INTRAVENOUS
  Filled 2022-07-08 (×11): qty 2

## 2022-07-08 MED ORDER — PROTAMINE SULFATE 10 MG/ML IV SOLN
INTRAVENOUS | Status: DC | PRN
  Administered 2022-07-08: 220 mg via INTRAVENOUS

## 2022-07-08 MED ORDER — PAPAVERINE HCL 30 MG/ML IJ SOLN
INTRAMUSCULAR | Status: AC
  Filled 2022-07-08: qty 2

## 2022-07-08 MED ORDER — CHLORHEXIDINE GLUCONATE 4 % EX LIQD: 30.0000 mL | CUTANEOUS | Status: DC

## 2022-07-08 MED ORDER — CHLORHEXIDINE GLUCONATE 0.12 % MT SOLN: 15.0000 mL | Freq: Once | OROMUCOSAL | Status: DC

## 2022-07-08 MED ORDER — ~~LOC~~ CARDIAC SURGERY, PATIENT & FAMILY EDUCATION
Freq: Once | Status: DC
  Filled 2022-07-08: qty 1

## 2022-07-08 MED ORDER — BISACODYL 5 MG PO TBEC
10.0000 mg | DELAYED_RELEASE_TABLET | Freq: Every day | ORAL | Status: DC
  Administered 2022-07-09 – 2022-07-12 (×4): 10 mg via ORAL
  Filled 2022-07-08 (×6): qty 2

## 2022-07-08 MED ORDER — METHYLPREDNISOLONE SODIUM SUCC 125 MG IJ SOLR
INTRAMUSCULAR | Status: AC
  Filled 2022-07-08: qty 2

## 2022-07-08 MED ORDER — VASOPRESSIN 20 UNITS/100 ML INFUSION FOR SHOCK
0.0000 [IU]/min | INTRAVENOUS | Status: DC
  Administered 2022-07-08: .03 [IU]/min via INTRAVENOUS
  Filled 2022-07-08: qty 100

## 2022-07-08 MED ORDER — PROPOFOL 10 MG/ML IV BOLUS
INTRAVENOUS | Status: AC
  Filled 2022-07-08: qty 20

## 2022-07-08 MED ORDER — CHLORHEXIDINE GLUCONATE CLOTH 2 % EX PADS
6.0000 | MEDICATED_PAD | Freq: Every day | CUTANEOUS | Status: DC
  Administered 2022-07-08 – 2022-08-13 (×38): 6 via TOPICAL

## 2022-07-08 MED ORDER — ONDANSETRON HCL 4 MG/2ML IJ SOLN
INTRAMUSCULAR | Status: AC
  Filled 2022-07-08: qty 2

## 2022-07-08 MED ORDER — DOCUSATE SODIUM 100 MG PO CAPS
200.0000 mg | ORAL_CAPSULE | Freq: Every day | ORAL | Status: DC
  Administered 2022-07-09 – 2022-07-12 (×4): 200 mg via ORAL
  Filled 2022-07-08 (×4): qty 2

## 2022-07-08 MED ORDER — EPINEPHRINE 1 MG/10ML IJ SOSY
PREFILLED_SYRINGE | INTRAMUSCULAR | Status: DC | PRN
  Administered 2022-07-08: 20 ug via INTRAVENOUS

## 2022-07-08 MED ORDER — ACETAMINOPHEN 160 MG/5ML PO SOLN: 650.0000 mg | Freq: Once | ORAL | Status: AC

## 2022-07-08 MED ORDER — ASPIRIN 325 MG PO TBEC: 325.0000 mg | DELAYED_RELEASE_TABLET | Freq: Every day | ORAL | Status: DC

## 2022-07-08 MED ORDER — EPINEPHRINE HCL 5 MG/250ML IV SOLN IN NS
0.0000 ug/min | INTRAVENOUS | Status: DC
  Administered 2022-07-09: 4 ug/min via INTRAVENOUS
  Filled 2022-07-08: qty 250

## 2022-07-08 MED ORDER — EPHEDRINE SULFATE-NACL 50-0.9 MG/10ML-% IV SOSY
PREFILLED_SYRINGE | INTRAVENOUS | Status: DC | PRN
  Administered 2022-07-08 (×4): 5 mg via INTRAVENOUS

## 2022-07-08 MED ORDER — SODIUM CHLORIDE 0.9% FLUSH: 10.0000 mL | INTRAVENOUS | Status: DC | PRN

## 2022-07-08 MED ORDER — ASPIRIN 325 MG PO TABS: 325.0000 mg | ORAL_TABLET | Freq: Once | ORAL | Status: DC

## 2022-07-08 MED ORDER — PANTOPRAZOLE SODIUM 40 MG PO TBEC
40.0000 mg | DELAYED_RELEASE_TABLET | Freq: Every day | ORAL | Status: DC
  Administered 2022-07-10 – 2022-08-13 (×35): 40 mg via ORAL
  Filled 2022-07-08 (×35): qty 1

## 2022-07-08 MED ORDER — SODIUM CHLORIDE 0.9% FLUSH
10.0000 mL | Freq: Two times a day (BID) | INTRAVENOUS | Status: DC
  Administered 2022-07-08: 20 mL
  Administered 2022-07-09 – 2022-07-11 (×4): 10 mL
  Administered 2022-07-11 – 2022-07-12 (×2): 40 mL
  Administered 2022-07-12 – 2022-07-23 (×20): 10 mL

## 2022-07-08 MED ORDER — METOPROLOL TARTRATE 5 MG/5ML IV SOLN: 2.5000 mg | INTRAVENOUS | Status: DC | PRN

## 2022-07-08 MED ORDER — ACETAMINOPHEN 500 MG PO TABS
1000.0000 mg | ORAL_TABLET | Freq: Four times a day (QID) | ORAL | Status: AC
  Administered 2022-07-08 – 2022-07-13 (×19): 1000 mg via ORAL
  Filled 2022-07-08 (×17): qty 2

## 2022-07-08 MED ORDER — PLASMA-LYTE A IV SOLN
INTRAVENOUS | Status: DC | PRN
  Administered 2022-07-08: 500 mL via INTRAVASCULAR

## 2022-07-08 MED ORDER — HEMOSTATIC AGENTS (NO CHARGE) OPTIME
TOPICAL | Status: DC | PRN
  Administered 2022-07-08: 4 via TOPICAL

## 2022-07-08 MED ORDER — HEPARIN SODIUM (PORCINE) 1000 UNIT/ML IJ SOLN
INTRAMUSCULAR | Status: DC | PRN
  Administered 2022-07-08: 24000 [IU] via INTRAVENOUS

## 2022-07-08 MED ORDER — VASOPRESSIN 20 UNIT/ML IV SOLN
INTRAVENOUS | Status: DC | PRN
  Administered 2022-07-08 (×2): 1 [IU] via INTRAVENOUS

## 2022-07-08 MED ORDER — SODIUM CHLORIDE 0.9% FLUSH: 3.0000 mL | INTRAVENOUS | Status: DC | PRN

## 2022-07-08 MED ORDER — FLUTICASONE PROPIONATE 50 MCG/ACT NA SUSP: 2.0000 | Freq: Every day | NASAL | Status: DC | PRN

## 2022-07-08 MED ORDER — PROPOFOL 10 MG/ML IV BOLUS
INTRAVENOUS | Status: DC | PRN
  Administered 2022-07-08: 40 mg via INTRAVENOUS

## 2022-07-08 MED ORDER — MIDAZOLAM HCL (PF) 10 MG/2ML IJ SOLN
INTRAMUSCULAR | Status: AC
  Filled 2022-07-08: qty 2

## 2022-07-08 MED ORDER — VASOPRESSIN 20 UNIT/ML IV SOLN
INTRAVENOUS | Status: AC
  Filled 2022-07-08: qty 1

## 2022-07-08 MED ORDER — MIDAZOLAM HCL 2 MG/2ML IJ SOLN
2.0000 mg | INTRAMUSCULAR | Status: DC | PRN
  Administered 2022-07-08: 2 mg via INTRAVENOUS
  Filled 2022-07-08: qty 2

## 2022-07-08 MED ORDER — 0.9 % SODIUM CHLORIDE (POUR BTL) OPTIME
TOPICAL | Status: DC | PRN
  Administered 2022-07-08: 5000 mL

## 2022-07-08 MED ORDER — SODIUM CHLORIDE 0.45 % IV SOLN: INTRAVENOUS | Status: DC | PRN

## 2022-07-08 MED ORDER — CEFAZOLIN SODIUM-DEXTROSE 2-4 GM/100ML-% IV SOLN
2.0000 g | Freq: Three times a day (TID) | INTRAVENOUS | Status: AC
  Administered 2022-07-08 – 2022-07-10 (×6): 2 g via INTRAVENOUS
  Filled 2022-07-08 (×6): qty 100

## 2022-07-08 MED ORDER — DEXMEDETOMIDINE HCL IN NACL 400 MCG/100ML IV SOLN
0.0000 ug/kg/h | INTRAVENOUS | Status: DC
  Administered 2022-07-08: 0.6 ug/kg/h via INTRAVENOUS
  Filled 2022-07-08: qty 100

## 2022-07-08 MED ORDER — PHENYLEPHRINE 80 MCG/ML (10ML) SYRINGE FOR IV PUSH (FOR BLOOD PRESSURE SUPPORT)
PREFILLED_SYRINGE | INTRAVENOUS | Status: DC | PRN
  Administered 2022-07-08 (×3): 80 ug via INTRAVENOUS

## 2022-07-08 MED ORDER — CHLORHEXIDINE GLUCONATE 0.12 % MT SOLN
15.0000 mL | OROMUCOSAL | Status: AC
  Administered 2022-07-08: 15 mL via OROMUCOSAL
  Filled 2022-07-08: qty 15

## 2022-07-08 MED ORDER — PAPAVERINE HCL 30 MG/ML IJ SOLN
INTRAMUSCULAR | Status: DC | PRN
  Administered 2022-07-08: 2 mL via INTRAVENOUS

## 2022-07-08 MED ORDER — INSULIN REGULAR(HUMAN) IN NACL 100-0.9 UT/100ML-% IV SOLN: INTRAVENOUS | Status: DC

## 2022-07-08 MED ORDER — BISACODYL 10 MG RE SUPP
10.0000 mg | Freq: Every day | RECTAL | Status: DC
  Administered 2022-07-14: 10 mg via RECTAL
  Filled 2022-07-08: qty 1

## 2022-07-08 MED ORDER — ASPIRIN 81 MG PO CHEW: 324.0000 mg | CHEWABLE_TABLET | Freq: Every day | ORAL | Status: DC

## 2022-07-08 SURGICAL SUPPLY — 104 items
ADAPTER CARDIO PERF ANTE/RETRO (ADAPTER) ×2 IMPLANT
APPLICATOR TIP COSEAL (VASCULAR PRODUCTS) IMPLANT
BAG DECANTER FOR FLEXI CONT (MISCELLANEOUS) ×2 IMPLANT
BLADE CLIPPER SURG (BLADE) ×2 IMPLANT
BLADE MICRO SHARP 3 15 DEG (BLADE) ×2 IMPLANT
BLADE STERNUM SYSTEM 6 (BLADE) ×2 IMPLANT
BLADE SURG 11 STRL SS (BLADE) IMPLANT
BNDG ELASTIC 4X5.8 VLCR STR LF (GAUZE/BANDAGES/DRESSINGS) ×2 IMPLANT
BNDG ELASTIC 6X5.8 VLCR STR LF (GAUZE/BANDAGES/DRESSINGS) ×2 IMPLANT
BNDG GAUZE DERMACEA FLUFF 4 (GAUZE/BANDAGES/DRESSINGS) ×2 IMPLANT
BOOT SUTURE AID YELLOW STND (SUTURE) ×2 IMPLANT
CANISTER SUCT 3000ML PPV (MISCELLANEOUS) ×2 IMPLANT
CANNULA AORTIC ROOT 9FR (CANNULA) IMPLANT
CANNULA MC2 2 STG 36/46 NON-V (CANNULA) IMPLANT
CANNULA NON VENT 20FR 12 (CANNULA) IMPLANT
CANNULA NON VENT 22FR 12 (CANNULA) ×2 IMPLANT
CANNULA VENOUS 2 STG 34/46 (CANNULA) ×2
CANNULA VESSEL 3MM BLUNT TIP (CANNULA) IMPLANT
CAP LEAD ASSY DEFIB 4033A IMPLANT
CATH RETROPLEGIA CORONARY 14FR (CATHETERS) ×2 IMPLANT
CATH ROBINSON RED A/P 18FR (CATHETERS) ×6 IMPLANT
CATH THOR STR 32F SOFT 20 RADI (CATHETERS) ×4 IMPLANT
CATH THORACIC 28FR RT ANG (CATHETERS) ×2 IMPLANT
CATH/SQUID NICHOLS JEHLE COR (CATHETERS) IMPLANT
CAUTERY SURG HI TEMP FINE TIP (MISCELLANEOUS) IMPLANT
CLIP TI LARGE 6 (CLIP) ×2 IMPLANT
CLIP VESOCCLUDE MED 24/CT (CLIP) IMPLANT
CLIP VESOCCLUDE SM WIDE 24/CT (CLIP) IMPLANT
CONTAINER PROTECT SURGISLUSH (MISCELLANEOUS) ×6 IMPLANT
DERMABOND ADVANCED .7 DNX12 (GAUZE/BANDAGES/DRESSINGS) IMPLANT
DRAPE CARDIOVASCULAR INCISE (DRAPES) ×2
DRAPE INCISE IOBAN 66X45 STRL (DRAPES) ×2 IMPLANT
DRAPE SRG 135X102X78XABS (DRAPES) ×2 IMPLANT
DRAPE WARM FLUID 44X44 (DRAPES) ×2 IMPLANT
DRESSING AQUACEL AG ADV 3.5X12 (MISCELLANEOUS) ×2 IMPLANT
DRSG AQUACEL AG ADV 3.5X10 (GAUZE/BANDAGES/DRESSINGS) IMPLANT
DRSG AQUACEL AG ADV 3.5X12 (MISCELLANEOUS) ×2
DRSG AQUACEL AG ADV 3.5X14 (GAUZE/BANDAGES/DRESSINGS) ×2 IMPLANT
ELECT BLADE 4.0 EZ CLEAN MEGAD (MISCELLANEOUS) ×2
ELECT CAUTERY BLADE 6.4 (BLADE) ×2 IMPLANT
ELECT REM PT RETURN 9FT ADLT (ELECTROSURGICAL) ×4
ELECTRODE BLDE 4.0 EZ CLN MEGD (MISCELLANEOUS) ×2 IMPLANT
ELECTRODE REM PT RTRN 9FT ADLT (ELECTROSURGICAL) ×4 IMPLANT
FELT TEFLON 1X6 (MISCELLANEOUS) ×4 IMPLANT
GAUZE 4X4 16PLY ~~LOC~~+RFID DBL (SPONGE) IMPLANT
GAUZE SPONGE 4X4 12PLY STRL (GAUZE/BANDAGES/DRESSINGS) ×4 IMPLANT
GAUZE SPONGE 4X4 12PLY STRL LF (GAUZE/BANDAGES/DRESSINGS) IMPLANT
GLOVE SURG SS PI 7.5 STRL IVOR (GLOVE) IMPLANT
GOWN STRL REUS W/ TWL LRG LVL3 (GOWN DISPOSABLE) ×12 IMPLANT
GOWN STRL REUS W/TWL LRG LVL3 (GOWN DISPOSABLE) ×12
HEMOSTAT POWDER SURGIFOAM 1G (HEMOSTASIS) ×6 IMPLANT
HEMOSTAT SURGICEL 2X14 (HEMOSTASIS) ×2 IMPLANT
IMPL BIOMEC 54 ~~LOC~~ (Pacemaker) IMPLANT
IMPLANT BIOMEC 54 ~~LOC~~ (Pacemaker) ×4 IMPLANT
INSERT FOGARTY 61MM (MISCELLANEOUS) IMPLANT
INSERT FOGARTY XLG (MISCELLANEOUS) IMPLANT
KIT BASIN OR (CUSTOM PROCEDURE TRAY) ×2 IMPLANT
KIT CATH CPB BARTLE (MISCELLANEOUS) ×2 IMPLANT
KIT SUCTION CATH 14FR (SUCTIONS) ×2 IMPLANT
KIT TURNOVER KIT B (KITS) ×2 IMPLANT
KIT VASOVIEW HEMOPRO 2 VH 4000 (KITS) ×2 IMPLANT
LINE VENT (MISCELLANEOUS) IMPLANT
NS IRRIG 1000ML POUR BTL (IV SOLUTION) ×10 IMPLANT
PACK E OPEN HEART (SUTURE) ×2 IMPLANT
PACK OPEN HEART (CUSTOM PROCEDURE TRAY) ×2 IMPLANT
PAD ARMBOARD 7.5X6 YLW CONV (MISCELLANEOUS) ×4 IMPLANT
PAD ELECT DEFIB RADIOL ZOLL (MISCELLANEOUS) ×2 IMPLANT
PENCIL BUTTON HOLSTER BLD 10FT (ELECTRODE) ×2 IMPLANT
POSITIONER HEAD DONUT 9IN (MISCELLANEOUS) ×2 IMPLANT
PUNCH AORTIC ROTATE 4.0MM (MISCELLANEOUS) ×2 IMPLANT
PUNCH AORTIC ROTATE 4.5MM 8IN (MISCELLANEOUS) ×2 IMPLANT
SEALANT SURG COSEAL 8ML (VASCULAR PRODUCTS) IMPLANT
SET MPS 3-ND DEL (MISCELLANEOUS) IMPLANT
SUPPORT HEART JANKE-BARRON (MISCELLANEOUS) ×2 IMPLANT
SUT BONE WAX W31G (SUTURE) ×2 IMPLANT
SUT EB EXC GRN/WHT 2-0 V-5 (SUTURE) IMPLANT
SUT MNCRL AB 4-0 PS2 18 (SUTURE) ×4 IMPLANT
SUT PERMA SILK 0 CT1 (SUTURE) IMPLANT
SUT PROLENE 4 0 RB 1 (SUTURE) ×16
SUT PROLENE 4 0 SH DA (SUTURE) ×2 IMPLANT
SUT PROLENE 4-0 RB1 .5 CRCL 36 (SUTURE) ×2 IMPLANT
SUT PROLENE 5 0 C 1 36 (SUTURE) IMPLANT
SUT PROLENE 6 0 C 1 30 (SUTURE) IMPLANT
SUT PROLENE 7 0 BV1 MDA (SUTURE) ×2 IMPLANT
SUT SILK 2 0 SH (SUTURE) IMPLANT
SUT SILK 2 0 SH CR/8 (SUTURE) IMPLANT
SUT STEEL 6MS V (SUTURE) IMPLANT
SUT STEEL SZ 6 DBL 3X14 BALL (SUTURE) ×6 IMPLANT
SUT VIC AB 0 CTX 36 (SUTURE) ×4
SUT VIC AB 0 CTX36XBRD ANTBCTR (SUTURE) ×4 IMPLANT
SUT VIC AB 2-0 CT1 27 (SUTURE) ×8
SUT VIC AB 2-0 CT1 TAPERPNT 27 (SUTURE) ×4 IMPLANT
SYSTEM SAHARA CHEST DRAIN ATS (WOUND CARE) ×2 IMPLANT
TAPE CLOTH SURG 6X10 WHT LF (GAUZE/BANDAGES/DRESSINGS) IMPLANT
TOWEL GREEN STERILE (TOWEL DISPOSABLE) ×2 IMPLANT
TOWEL GREEN STERILE FF (TOWEL DISPOSABLE) ×2 IMPLANT
TRAY FOLEY SLVR 16FR TEMP STAT (SET/KITS/TRAYS/PACK) ×2 IMPLANT
TUBE SUCTION CARDIAC 10FR (CANNULA) IMPLANT
TUBING CONNECTING 10 (TUBING) IMPLANT
TUBING LAP HI FLOW INSUFFLATIO (TUBING) ×2 IMPLANT
UNDERPAD 30X36 HEAVY ABSORB (UNDERPADS AND DIAPERS) ×2 IMPLANT
VALVE AORTIC KONECT RESILIA 29 (Valve) IMPLANT
WATER STERILE IRR 1000ML POUR (IV SOLUTION) ×4 IMPLANT
YANKAUER SUCT BULB TIP NO VENT (SUCTIONS) IMPLANT

## 2022-07-08 NOTE — Interval H&P Note (Signed)
History and Physical Interval Note:  07/08/2022 6:36 AM  Joe [name]  has presented today for surgery, with the diagnosis of TAA SEVERE AI.  The various methods of treatment have been discussed with the patient and family. After consideration of risks, benefits and other options for treatment, the patient has consented to  Procedure(s): ASCENDING AORTIC ROOT REPLACEMENT (N/A) possible CORONARY ARTERY BYPASS GRAFTING (CABG) (N/A) TRANSESOPHAGEAL ECHOCARDIOGRAM (TEE) (N/A) EPICARDIAL PACING LEAD PLACEMENT (N/A) as a surgical intervention.  The patient's history has been reviewed, patient examined, no change in status, stable for surgery.  I have reviewed the patient's chart and labs.  Questions were answered to the patient's satisfaction.     Coralie Common

## 2022-07-08 NOTE — Brief Op Note (Signed)
07/08/2022  2:41 PM  PATIENT:  Joe [name]  66 y.o. male  PRE-OPERATIVE DIAGNOSIS:  TAA SEVERE AI  POST-OPERATIVE DIAGNOSIS:  TAA SEVERE AI  PROCEDURE:  BENTALL PROCEDURE USING RESILIA KONECT 29MM CONDUIT (N/A)  CORONARY ARTERY BYPASS GRAFTING (CABG) X 1, USING ENDOSCOPICALLY HARVESTED RIGHT LEG GREATER SAPHENOUS VEIN  -SVG to RCA Vein harvest time: 23mn Vein prep time: 151m  TRANSESOPHAGEAL ECHOCARDIOGRAM (TEE) (N/A) EPICARDIAL PACING LEAD PLACEMENT (N/A)  SURGEON:  Surgeon(s) and Role: WeCoralie CommonMD - Primary  PHYSICIAN ASSISTANT:   ASSISTANTS: Despina AriasRNFA  ANESTHESIA:   general  EBL:  1411 mL   BLOOD ADMINISTERED:none  DRAINS:  Mediastinal and pleural chest tubes    LOCAL MEDICATIONS USED:  NONE  SPECIMEN:  Source of Specimen:  Aortic valve leaflets  DISPOSITION OF SPECIMEN:  PATHOLOGY  COUNTS CORRECT:  YES  DICTATION: .Dragon Dictation  PLAN OF CARE: Admit to inpatient   PATIENT DISPOSITION:  ICU - intubated and hemodynamically stable.   Delay start of Pharmacological VTE agent (>24hrs) due to surgical blood loss or risk of bleeding: yes

## 2022-07-08 NOTE — Transfer of Care (Signed)
Immediate Anesthesia Transfer of Care Note  Patient: Joe [name]  Procedure(s) Performed: BENTALL PROCEDURE USING RESILIA KONECT 29MM CONDUIT (Chest) CORONARY ARTERY BYPASS GRAFTING (CABG) X 1, USING ENDOSCOPICALLY HARVESTED RIGHT LEG GREATER SAPHENOUS VEIN (Chest) TRANSESOPHAGEAL ECHOCARDIOGRAM (TEE) EPICARDIAL PACING LEAD PLACEMENT  Patient Location: ICU  Anesthesia Type:General  Level of Consciousness: Patient remains intubated per anesthesia plan  Airway & Oxygen Therapy: Patient remains intubated per anesthesia plan and Patient placed on Ventilator (see vital sign flow sheet for setting)  Post-op Assessment: Report given to RN and Post -op Vital signs reviewed and stable  Post vital signs: Reviewed and stable  Last Vitals:  Vitals Value Taken Time  BP    Temp 34.8 C 07/08/22 1343  Pulse 82 07/08/22 1343  Resp 19 07/08/22 1343  SpO2 99 % 07/08/22 1343  Vitals shown include unvalidated device data.  Last Pain:  Vitals:   07/08/22 0636  TempSrc:   PainSc: 0-No pain         Complications: No notable events documented.

## 2022-07-08 NOTE — Anesthesia Procedure Notes (Signed)
Central Venous Catheter Insertion Performed by: Audry Pili, MD, anesthesiologist Start/End9/27/2023 7:20 AM, 07/08/2022 7:22 AM Patient location: Pre-op. Preanesthetic checklist: patient identified, IV checked, risks and benefits discussed, surgical consent, monitors and equipment checked, pre-op evaluation, timeout performed and anesthesia consent Position: Trendelenburg Hand hygiene performed  and maximum sterile barriers used  Total catheter length 10. PA cath was placed.Swan type:thermodilution PA Cath depth:55 Procedure performed without using ultrasound guided technique. Attempts: 1 Patient tolerated the procedure well with no immediate complications.

## 2022-07-08 NOTE — Anesthesia Postprocedure Evaluation (Signed)
Anesthesia Post Note  Patient: Joe [name]  Procedure(s) Performed: BENTALL PROCEDURE USING RESILIA KONECT 29MM CONDUIT (Chest) CORONARY ARTERY BYPASS GRAFTING (CABG) X 1, USING ENDOSCOPICALLY HARVESTED RIGHT LEG GREATER SAPHENOUS VEIN (Chest) TRANSESOPHAGEAL ECHOCARDIOGRAM (TEE) EPICARDIAL PACING LEAD PLACEMENT     Patient location during evaluation: ICU Anesthesia Type: General Level of consciousness: sedated and patient remains intubated per anesthesia plan Pain management: pain level controlled Vital Signs Assessment: post-procedure vital signs reviewed and stable Respiratory status: patient remains intubated per anesthesia plan Cardiovascular status: stable Postop Assessment: no apparent nausea or vomiting Anesthetic complications: no   No notable events documented.  Last Vitals:  Vitals:   07/08/22 0732 07/08/22 1324  BP:    Pulse:    Resp: 20   Temp:    SpO2:  96%    Last Pain:  Vitals:   07/08/22 0636  TempSrc:   PainSc: 0-No pain                 Audry Pili

## 2022-07-08 NOTE — Progress Notes (Signed)
  Echocardiogram Echocardiogram Transesophageal has been performed.  Joe [name] 07/08/2022, 1:45 PM

## 2022-07-08 NOTE — Hospital Course (Addendum)
History of Present Illness:     I have been asked to see this 66 yo wm with a new diagnosis of an aortic root aneurysm and severe AI. Pt reportedly from mother was seen months ago by new NP and heard carotid sound that along with EKG was asked to see cardiology.Pt was evaluated with holter monitor and was asked to start lasix for lower extremity edema which he took PRN. Pt has been very active with bike riding over 5 miles a day. Mother report he has been felling well with no CP or significant DOE just the lower extremity edema. He was seen by Cardiology and an echo was performed with severe AI, severe dilation of the LV, and a 6 cm aortic root aneurysm. Pt was asked to report for futher inpt work up with coronary CTA and now with concern for LAD and RCA stenosis. Pt has remained asymptomatic since admission.  Dr. Lavonna Monarch reviewed the patient's chart, labs and diagnostic studies and determined that surgical aortic root replacement and surgical revascularization surgery would provide the best long term treatment. He reviewed Mr. Ingles's treatment options as well as the risks and benefits of surgery with Mr. Geisen and his mother. After careful consideration Mr Labella and his mother agreed to proceed with surgery.    Hospital Course: Mr. Novelo presented to [facility] and was brought to the operating room on 07/08/22. He underwent a Bentall procedure with bioprosthetic aortic valve replacement using  a 70m Resilia Konect conduit as well as a CABG x 1 utilizing RSVG to RCA. He tolerated the procedure well and ws transferred to the SICU in stable condition. He was extubated the evening of surgery. Chest tube output was 1660 and 690 over 24 hours.  He was weaned off Epi and Levophed as hemodynamics allowed.  His chest tube were removed without complication. He required AAI pacing post operatively.  He was started on lasix for volume overloaded state. SGordy Councilmancatheter was removed without  complication. Advanced heart failure team was consulted and recommended guideline directed medical therapy for heart failure once pressors were weaned off as well as eventual cardiac resynchronization therapy. Foley catheter was removed routinely.  Pressors have been weaned off by postop day 4.  He is on amiodarone for junctional rhythm.  He developed Atrial Fibrillation with controlled rates.  EP consult was obtained due to low EF and rhythm issues they felt the patient would benefit from CRT-D placement.  The patient was aggressively diuresed by AHF team.  He developed atrial flutter vs atrial tach. He underwent attempted rapid atrial pacing with no success. He was started on IV amiodarone and low dose heparin. Reglan was given for continued constipation and abdominal pain. CXR continued to show gaseous distension within the bowel, KUB showed gaseous distension of the small and large bowel likely due to postoperative ileus. He was made NPO with sips and chips for bowel rest. His hypotension worsened so norepinephrine was restarted temporarily. His anemia worsened so 1U of RBCs were transfused. Urine output drastically decreased so bladder scan was ordered, greater than 1L of retained urine was noted. Foley catheter was inserted without complication and he was started on Flomax for urinary retention. Echocardiogram showed EF <20%. Norepinephrine was weaned as tolerated and midodrine was restarted. Electrophysiology was consulted and recommended CRT-D implantation in the outpatient setting if he requires DC-CV for postop afib/aflutter due to the need for uninterrupted NOACs after DC-CV.  By postop day 10, he was still in  atrial fibrillation with controlled ventricular response.  He was tolerating this reasonably well.  EP team plan for eventual DC cardioversion prior to discharge.  Hypokalemia was managed with both IV and oral supplements.  Foley catheter was removed on 07/20/2022.  Echocardiogram was ordered to  assess HF off pressors. Updated echocardiogram showed continued LVEF <20%. Epicardial pacing wires were removed without complication. Heparin was discontinued and Eliquis was started. He became hyponatremic so his lasix was discontinued. He remained in atrial fibrillation so he underwent TEE/DC-CV on 10/12 which was successfully converted him to sinus rhythm. He began passing gas with postoperative ileus but had not had a bowel movement in 8 days. Neostigmine was started and he was given multiple suppositories.  PT was consulted, they recommended home health PT which was appropriately arranged. He had a bowel movement, loose stools. Regular diet was started. He remained fluid overloaded and hyponatremia improved so diuresis was restarted. He was started on demadex for fluid overload. After cardioversion he remained in sinus bradycardia so he was not placed on a beta blocker. Midodrine was continued for soft blood pressure. His bladder became distended and he became hypotensive. A foley catheter was replaced and removed 1345m. Urology was consulted and recommended discharging with foley and flomax and follow up as an outpatient. He converted into Afib with RVR. He was transferred back to the ICU. He was started on amiodarone drip and norepinephrine for hypotension. His RUE became edematous, a vascular ultrasound showed right sided acute nonocclusive DVT surrounding the PICC line in the subclavian vein. PICC line was removed and replaced on left side. Urinalysis showed large leukocytes and positive growth for rare bacteria, he was started on IV ceftriaxone. He was volume overload and showing signs of worsening CHF, he was diuresed aggressively. He became hypokalemic so potassium was supplemented accordingly.  Bowel issues persisted and his bowel regimen was adjusted.  He was stable for transfer to the progressive care unit on 07/30/22. Volume overload state resolved, diuretic was held. Hyponatremia resolved. The  patient continued to have a sore bottom.  He was found to have a stage 2 pressure sore at his gluteal cleft.  Wound care consultation was obtained and made recommendations for care. He continues to progress slowly in his physical activity level and disposition is not completely certain as we are not sure that he will have enough help at home to provide for current level of needs. TOC was consulted and recommended CIR. He began to have chest pain, diaphoresis and nausea. Limited echo was obtained and showed LVEF 20-25%, troponins were obtained and were mildly elevated at 36 and 35. CIR evaluated the patient but insurance ultimately denied him. New PT/OT consults recommended home health PT/OT. Sacral wound dressings were not changed x 2 days, wound care was consulted again A full thickness unstageable gluteal cleft wound was noted. Wound care made dressing changes were as well as new recommendations for care. He was referred to wound care clinic at discharge. All other wounds are healing well. AHF started Losartan and this was titrated to 50 on 10/27. He was also started on Spirinolactone 12.5 mg daily. He experienced dizziness with ambulation, orthostatic blood pressures were stable. Dizziness improved over the weekend. Abdominal pain increased, he had not had a bowel movement in 4 days. KUB showed mild to moderate stool burden.  He was treated with an enema which resulted in a bowel movement and improvement in abdominal pain. AHF discontinued Losartan and restarted home Entresto, this was titrated  as able. He is felt medically stable for discharge. Home health arrangements were ordered and made. Mother arranged private care services for 08/13/22.

## 2022-07-08 NOTE — Op Note (Signed)
[address]       [place] [address]             [phone]                                          07/08/2022 Patient:  Elta Guadeloupe A Pittman Pre-Op Dx: Aortic Root aneurysm with severe AI                     Cardiomyopathy                     Coronary artery disease   Post-op Dx:  Same Procedure: Aortic Root replacement with reimplantation of coronary buttons with a 29 mm KONECT Resilia valve conduit (Size 5m, model 11060A, SN [id number] CABG X 1 with RSVG from aortic graft to mid RCA   Endoscopic greater saphenous vein harvest on the right Epicardial pacemaker leads x 2 (REF [id number] ( REF [id number]   Surgeon and Role:      *Coralie Common MD- Primary    * BWynelle Beckmann, PA-C - assisting An experienced assistant was required given the complexity of this surgery and the standard of surgical care. The assistant was needed for exposure, dissection, suctioning, retraction of delicate tissues and sutures, instrument exchange and for overall help during this procedure.    Anesthesia  general EBL:  minimal ml Blood Administration: none Xclamp Time:  111 min Pump Time:  1511m  Drains: Chest tubes x 2 to anterior and posterior mediastinum Wires: 8 Counts: correct   Indications:    Pt is a [age]o wm with newly diagnosed severe AI, severe LV dilation and cardiomyopathy with EF 25-30% and with Aortic root aneurysm and possible CAD   Pt will require operative intervention with aortic root replacement and possible CABG which will be at increased risks due to severely dilated LV and cardiomyopathy. Will await cath results and would recommend heart failure evaluation preoperatively to assess further options for medical improvement prior to surgical intervention. Findings: The patient was found by TEE to have an EF of approximately 25 to 30%.  Aortic root measured at 6.5 cm.  The right coronary artery was a 2-1/2 mm vessel with mild disease.   Saphenous vein was except for bypass.  At the conclusion of the procedure the ejection fraction was still approximately 30% on inotropic support.  There was a well-functioning aortic valve.  Operative Technique: Patient was brought to the operating theater and placed on the table in a supine position.  After general anesthesia was obtained with use of an endotracheal tube the chest abdomen and legs were prepped and draped in sterile fashion.  Median sternotomy incision was then created sternal about the sternal saw.  Simultaneously from the right leg utilizing the endoscopic system saphenous vein graft was harvested by the PAs and this wound was closed in multilayer's observable suture.  The pericardial well was developed and heparin was delivered.  The aorta was cannulated with a 22 straight cannula and a two-stage cannula placed right atrial venous return.  With adequate confirmation of anticoagulation cardiopulmonary bypass was instituted.  Antegrade retrograde cardioplegia catheters were placed in ascending aorta and coronary sinus medically and a left ventricular someplace close right superior pulmonary vein.  The epicardial leads were then placed x2 on the lateral  aspect of the heart 1 inferior and 1 superior to the obtuse marginal.  The LV lead had capture 0.7 V'@0'$ .5 ms and 1.1 mA.  It sensed at 5.9 mV and 1.2 V/s and an impedance of 522 ohms.  The second lead had a 2 V at 0.5 ms capture at 3.5 mA and sensed at 3.3 mV 1.6 V/s with an impedance of 556.  These leads were then capped and placed in the superior mediastinum.  Aortic cross-clamp was placed with cold [place] blood cardioplegia and delivered retrograde for total of 1200 cc.  Topical ice was placed on the heart and the aorta was opened and the ascending aorta cut down to the sinotubular junction.  An additional 300 cc of direct left coronary ostial perfusion was then delivered with the hand-held coronary perfusion.  Following this the aortic valve  was resected and the coronary buttons created.  The rest of the sinuses of the aorta were resected.  The annulus was sized to a 29 mm connect pericardial tissue valve and this was secured in place with nineteen 2-0 Tevdek pledgeted sutures with pledgets 6 cheerier to the annulus.  Following this utilizing the eye cautery the buttons were fashioned onto the connector graft with 5-0 Prolene sutures.  The distal anastomosis of the graft to the ascending aorta was performed with a running 4-0 Prolene suture.  This time the saphenous vein was available and an end to side anastomosis was constructed to the right coronary artery utilizing 7-0 Prolene sutures was flushed here and found hemostatic.  Again utilizing the eye cautery the proximal anastomosis was not made with a running 6-0 Prolene suture in the proximal vein graft marker was utilized.  The patient in headdown position aortic cross-clamp was removed multiple de-airing maneuvers performed.  Ventricular and atrial pacing wires were placed and brought out through inferior stab was and secured.  With adequate de-airing the LV vent was removed and the patient was weaned from cardiopulmonary bypass on inotropic support.  With adequate hemodynamics protamine was delivered the patient decannulated and sites oversewn necessary.  Chest tube brought the inferior stab was and secured.  With adequate hemostasis the sternum was reapproximated with a standstill wire and the presternal subcutaneous tissue and skin were closed in multiple layers of absorbable suture.  Sterile dressings were applied.  I was present through 100% of the procedure. The patient tolerated the procedure without any immediate complications, and was transferred to the ICU in guarded condition.  Coralie Common MD

## 2022-07-08 NOTE — Anesthesia Procedure Notes (Signed)
Central Venous Catheter Insertion Performed by: Audry Pili, MD, anesthesiologist Start/End9/27/2023 7:10 AM, 07/08/2022 7:22 AM Patient location: Pre-op. Preanesthetic checklist: patient identified, IV checked, risks and benefits discussed, surgical consent, monitors and equipment checked, pre-op evaluation, timeout performed and anesthesia consent Position: Trendelenburg Lidocaine 1% used for infiltration and patient sedated Hand hygiene performed , maximum sterile barriers used  and Seldinger technique used Catheter size: 8.5 Fr Central line was placed.MAC introducer Procedure performed using ultrasound guided technique. Ultrasound Notes:anatomy identified, needle tip was noted to be adjacent to the nerve/plexus identified, no ultrasound evidence of intravascular and/or intraneural injection and image(s) printed for medical record Attempts: 1 Following insertion, line sutured, dressing applied and Biopatch. Post procedure assessment: blood return through all ports, no air and free fluid flow  Patient tolerated the procedure well with no immediate complications.

## 2022-07-08 NOTE — Anesthesia Procedure Notes (Signed)
Arterial Line Insertion Start/End9/27/2023 7:20 AM, 07/08/2022 7:25 AM Performed by: Audry Pili, MD, Griffin Dakin, CRNA, CRNA  Patient location: Pre-op. Preanesthetic checklist: patient identified, IV checked, site marked, risks and benefits discussed, surgical consent, monitors and equipment checked, pre-op evaluation, timeout performed and anesthesia consent Lidocaine 1% used for infiltration Right, radial was placed Catheter size: 20 G Hand hygiene performed  and maximum sterile barriers used   Attempts: 1 Procedure performed without using ultrasound guided technique. Following insertion, dressing applied and Biopatch. Post procedure assessment: normal and unchanged  Patient tolerated the procedure well with no immediate complications.

## 2022-07-08 NOTE — Procedures (Signed)
Extubation Procedure Note  Patient Details:   Name: Joe [name] DOB: 07-17-1956 MRN: [id number]   Airway Documentation:    Vent end date: 07/08/22 Vent end time: 1748   Evaluation  O2 sats: stable throughout Complications: No apparent complications Patient did tolerate procedure well. Bilateral Breath Sounds: Clear, Diminished   Extubated per Rapid Wean protocol. Patient weaning parameters met, NIF  -25, VC 1.4L, positive cuff leak. Patient able to speak & cough post extubation. IS instructed 750 x 5.  Kathie Dike 07/08/2022, 5:57 PM

## 2022-07-08 NOTE — Anesthesia Procedure Notes (Signed)
Procedure Name: Intubation Date/Time: 07/08/2022 7:54 AM  Performed by: Griffin Dakin, CRNAPre-anesthesia Checklist: Patient identified, Emergency Drugs available, Suction available and Patient being monitored Patient Re-evaluated:Patient Re-evaluated prior to induction Oxygen Delivery Method: Circle system utilized Preoxygenation: Pre-oxygenation with 100% oxygen Induction Type: IV induction Ventilation: Mask ventilation without difficulty Laryngoscope Size: Glidescope and 3 Grade View: Grade I Tube type: Oral Tube size: 8.0 mm Number of attempts: 1 Airway Equipment and Method: Stylet and Video-laryngoscopy Placement Confirmation: ETT inserted through vocal cords under direct vision, positive ETCO2 and breath sounds checked- equal and bilateral Secured at: 23 cm Tube secured with: Tape Dental Injury: Teeth and Oropharynx as per pre-operative assessment  Comments: Intubated by [place] under CRNA and MDA supervision

## 2022-07-08 NOTE — Consult Note (Addendum)
NAME:  Joe [name], MRN:  [id number], DOB:  [birth year], LOS: 0 ADMISSION DATE:  07/08/2022, CONSULTATION DATE:  07/08/22 REFERRING MD:  Lavonna Monarch - CVTS, CHIEF COMPLAINT:  s/p  Aortic root replacement, CABG x1   History of Present Illness:  66 yo M PMH CAD, aortic root aneurysm with severe aortic insufficiency, cardiomyopathy presented to [facility] hospital 07/08/22 for elective CABG x1 and aortic root replacement.   Cross clamp time 111 min Total pump time 153 minutes  EBL 1436m   Received 870 ml cellsaver and about 3.5 L crystalloid/colloid   Arrives to the ICU intubated post op.   PCCM is consulted in this setting   Pertinent  Medical History  CAD Aortic root aneurysm Aortic Insufficiency Cardiomyopathy   Significant Hospital Events: Including procedures, antibiotic start and stop dates in addition to other pertinent events   Elective CABG x1 and aortic root replacement. ICU admission after case   Interim History / Subjective:   Cross clamp time 111 min Total pump time 153 minutes  EBL 14151m  Received 870 ml cellsaver and about 3.5 L crystalloid/colloid   About 25072mut from chest tube in the past hour, approx [id number]   Objective   Blood pressure (!) 140/56, pulse 63, temperature 97.6 F (36.4 C), temperature source Oral, resp. rate 20, height _0  (1.702 m), weight 71.2 kg, SpO2 96 %. PAP: (19-35)/(-18-10) 29/10  Vent Mode: SIMV;PSV FiO2 (%):  [50 %] 50 % Set Rate:  [12 bmp] 12 bmp Vt Set:  [530 mL] 530 mL PEEP:  [5 cmH20] 5 cmH20 Pressure Support:  [10 cmH20] 10 cmH20   Intake/Output Summary (Last 24 hours) at 07/08/2022 1433 Last data filed at 07/08/2022 1356 Gross per 24 hour  Intake 4820 ml  Output 2981 ml  Net 1839 ml   Filed Weights   07/08/22 0606  Weight: 71.2 kg    Examination: General: critically ill older adult M intubated sedated NAD  Neuro: sedated 2mm 22mils  HENT: NCAT pink mm ETT secure, anicteric sclera Lungs: CTAb, symmetrical  chest expansion, mechanically ventilated.  Cardiovascular: rr s1s2 cap refill < 3 sec. Mediastinal drains with bloody output  Abdomen: soft ndnt  GU: foley, yellow urine  Extremities: RLE wrapped. R radial art line. R IJ introducer.  Skin: mediastinal dressing c/d/I. Skin is cool to touch, clean, dry     Resolved Hospital Problem list     Assessment & Plan:   07/08/22: POD 0 CABGx1 and aortic root replacement with Dr. WeldnLavonna Monarchxpected post-operative ventilator management Small L>R pleural effusions P -rapid wean when criteria met  -VAP, pulm hygiene -PRN CXR, ABG -post-extubation, early mobility, multimodal analgesia to prevent splinting, IS  Sedation for mechanical ventilation P -precedex + PRN analgesia for RASS 0 to -1  -delirium precautions   CAD s/p CABG x1 Aortic root aneurysm with severe AI, s/p aortic root replacement  Shock -- cardiogenic shock +/- post-cabg vasoplegia  P -chest tube management, pacing wires, and transfusion need per CVTS -complete txa -insulin gtt -pressors for MAP > 65 -trend labs  -passive rewarming -strict UOP, cont foley   -follow up 17:00 BMP, CBC   Expected post op ABLA -EBL 1400ml 21meived 870 cellsaver P -repeat CBC at 1700 -transfusion decisions per primary   Leukocytosis -expect this is reactive in setting of above P -receiving abx ppx -- ancef, vanc    Best Practice (right click and "Reselect all SmartList Selections" daily)   Diet/type: NPO  DVT prophylaxis: not indicated GI prophylaxis: H2B Lines: Central line, Arterial Line, and yes and it is still needed Foley:  Yes, and it is still needed Code Status:  full code Last date of multidisciplinary goals of care discussion [--]  Labs   CBC: Recent Labs  Lab 07/07/22 0900 07/08/22 0810 07/08/22 1057 07/08/22 1100 07/08/22 1130 07/08/22 1156 07/08/22 1350  WBC 4.9  --   --   --   --   --  16.5*  HGB 15.3   < > 11.2* 11.9* 10.5* 10.2* 11.4*  HCT 43.1    < > 33.0* 32.3* 31.0* 30.0* 32.5*  MCV 102.1*  --   --   --   --   --  103.2*  PLT 210  --   --  136*  --   --  134*   < > = values in this interval not displayed.    Basic Metabolic Panel: Recent Labs  Lab 07/07/22 0900 07/08/22 0810 07/08/22 0857 07/08/22 0916 07/08/22 0946 07/08/22 1029 07/08/22 1057 07/08/22 1130 07/08/22 1156  NA 137 137 137   < > 137 138 135 136 137  K 4.5 3.9 4.1   < > 5.3* 4.7 4.8 5.2* 4.6  CL 109 104 104  --  106  --  104  --  103  CO2 20*  --   --   --   --   --   --   --   --   GLUCOSE 108* 101* 130*  --  145*  --  135*  --  151*  BUN _0 --  13  --  13  --  12  CREATININE 0.88 0.70 0.70  --  0.60*  --  0.60*  --  0.60*  CALCIUM 9.4  --   --   --   --   --   --   --   --    < > = values in this interval not displayed.   GFR: Estimated Creatinine Clearance: 82.6 mL/min (A) (by C-G formula based on SCr of 0.6 mg/dL (L)). Recent Labs  Lab 07/07/22 0900 07/08/22 1350  WBC 4.9 16.5*    Liver Function Tests: Recent Labs  Lab 07/07/22 0900  AST 23  ALT 23  ALKPHOS 67  BILITOT 1.2  PROT 6.5  ALBUMIN 3.9   No results for input(s): "LIPASE", "AMYLASE" in the last 168 hours. No results for input(s): "AMMONIA" in the last 168 hours.  ABG    Component Value Date/Time   PHART 7.429 07/08/2022 1130   PCO2ART 38.8 07/08/2022 1130   PO2ART 283 (H) 07/08/2022 1130   HCO3 25.7 07/08/2022 1130   TCO2 22 07/08/2022 1156   ACIDBASEDEF 2.0 07/08/2022 0921   O2SAT 100 07/08/2022 1130     Coagulation Profile: Recent Labs  Lab 07/07/22 0900 07/08/22 1350  INR 1.1 1.7*    Cardiac Enzymes: No results for input(s): "CKTOTAL", "CKMB", "CKMBINDEX", "TROPONINI" in the last 168 hours.  HbA1C: Hgb A1c MFr Bld  Date/Time Value Ref Range Status  07/07/2022 08:28 AM 5.3 4.8 - 5.6 % Final    Comment:    (NOTE) Pre diabetes:          5.7%-6.4%  Diabetes:              >6.4%  Glycemic control for   <7.0% adults with diabetes      CBG: Recent Labs  Lab 07/08/22 1348  GLUCAP 140*  Review of Systems:   Unable to obtain, intubated and sedated   Past Medical History:  He,  has a past medical history of Anxiety, Arthritis, Cognitive deficits, Congenital brain damage ([place]), Constipation, Coronary artery disease, Expressive speech delay, History of kidney stones, Hypertension, LBBB (left bundle branch block) (05/26/2022), Lower extremity edema (05/26/2022), Mental retardation, Murmur (05/26/2022), Osteoarthritis of left hip (01/16/2014), Osteoarthritis of right hip (05/15/2014), Primary localized osteoarthrosis of left shoulder (09/21/2017), PVC (premature ventricular contraction) (05/26/2022), Right ureteral stone, Speech impediment, Thoracic ascending aortic aneurysm ([place]), and Umbilical hernia.   Surgical History:   Past Surgical History:  Procedure Laterality Date   BACK SURGERY     CYSTOSCOPY W/ URETERAL STENT REMOVAL Right 04/19/2013   Procedure: CYSTOSCOPY WITH STENT REMOVAL;  Surgeon: Molli Hazard, MD;  Location: [facility];  Service: Urology;  Laterality: Right;   CYSTOSCOPY WITH RETROGRADE PYELOGRAM, URETEROSCOPY AND STENT PLACEMENT Right 04/19/2013   Procedure: CYSTOSCOPY WITH RETROGRADE PYELOGRAM, URETEROSCOPY ;  Surgeon: Molli Hazard, MD;  Location: [facility];  Service: Urology;  Laterality: Right;   CYSTOSCOPY WITH STENT PLACEMENT Right 03/24/2013   Procedure: CYSTOSCOPY WITH STENT PLACEMENT;  Surgeon: Hanley Ben, MD;  Location: [facility];  Service: Urology;  Laterality: Right;   HOLMIUM LASER APPLICATION Right 01/18/7025   Procedure: HOLMIUM LASER APPLICATION;  Surgeon: Molli Hazard, MD;  Location: [facility];  Service: Urology;  Laterality: Right;   INGUINAL HERNIA REPAIR Left 03-04-2005   POSTERIOR LUMBAR FUSION  08-27-2011   L4 -- L5   RIGHT HEART CATH AND CORONARY ANGIOGRAPHY N/A 06/29/2022   Procedure: RIGHT HEART CATH AND  CORONARY ANGIOGRAPHY;  Surgeon: Sherren Mocha, MD;  Location: [facility];  Service: Cardiovascular;  Laterality: N/A;   RIGHT SHOULDER HEMIARTHROPLASTY  08-26-2010   OA   SHOULDER HEMI-ARTHROPLASTY Left 09/21/2017   Procedure: SHOULDER HEMI-ARTHROPLASTY;  Surgeon: Marchia Bond, MD;  Location: [place];  Service: Orthopedics;  Laterality: Left;   TONSILLECTOMY  AS CHILD   TOTAL HIP ARTHROPLASTY Left 01/16/2014   Procedure: TOTAL HIP ARTHROPLASTY;  Surgeon: Johnny Bridge, MD;  Location: [place];  Service: Orthopedics;  Laterality: Left;   TOTAL HIP ARTHROPLASTY Right 05/15/2014   Procedure: RIGHT TOTAL HIP ARTHROPLASTY;  Surgeon: Johnny Bridge, MD;  Location: [place];  Service: Orthopedics;  Laterality: Right;   TOTAL SHOULDER ARTHROPLASTY Right 2011     Social History:   reports that he has never smoked. He has never used smokeless tobacco. He reports that he does not drink alcohol and does not use drugs.   Family History:  His family history includes Atrial fibrillation in his maternal grandmother and mother; Heart failure in his maternal grandmother and mother.   Allergies Allergies  Allergen Reactions   Codeine Nausea Only   Levaquin [Levofloxacin In D5w] Diarrhea     Home Medications  Prior to Admission medications   Medication Sig Start Date End Date Taking? Authorizing Provider  acetaminophen (TYLENOL) 325 MG tablet Take 650 mg by mouth every 6 (six) hours as needed for mild pain.   Yes [provider]  aspirin EC 81 MG tablet Take 1 tablet (81 mg total) by mouth daily. Swallow whole. 07/01/22  Yes Earnie Larsson, NP  atorvastatin (LIPITOR) 40 MG tablet Take 1 tablet (40 mg total) by mouth daily. 07/01/22  Yes Earnie Larsson, NP  Cholecalciferol (VITAMIN D3) 250 MCG (10000 UT) capsule Take 10,000 Units by mouth daily.   Yes  [provider]  Coenzyme Q10 (CO Q-10) 100 MG CAPS Take 100 mg by mouth daily.   Yes [provider]  empagliflozin (JARDIANCE)  10 MG TABS tablet Take 1 tablet (10 mg total) by mouth daily. 07/01/22  Yes Earnie Larsson, NP  fluticasone (FLONASE) 50 MCG/ACT nasal spray Place 2 sprays into both nostrils daily. Patient taking differently: Place 2 sprays into both nostrils daily as needed for allergies. 01/09/20  Yes Minette Brine, FNP  furosemide (LASIX) 20 MG tablet Take 1 tablet (20 mg total) by mouth as needed for edema. 05/26/22 08/24/22 Yes Skeet Latch, MD  OVER THE COUNTER MEDICATION Take 2 capsules by mouth daily. EPA & DHA   Yes [provider]  sacubitril-valsartan (ENTRESTO) 24-26 MG Take 1 tablet by mouth 2 (two) times daily. 06/30/22  Yes Earnie Larsson, NP  spironolactone (ALDACTONE) 25 MG tablet Take 0.5 tablets (12.5 mg total) by mouth daily. 07/01/22  Yes Earnie Larsson, NP  tamsulosin (FLOMAX) 0.4 MG CAPS capsule Take 0.4 mg by mouth 2 (two) times daily. 04/11/22  Yes [provider]  UNABLE TO FIND Take 1 tablet by mouth daily. Med Name: Neuro optimizer   Yes [provider]  UNABLE TO FIND Take 1-2 tablets by mouth See admin instructions. Med Name: Neuro Zac: Take 2 tablets by mouth in morning and 1 tablet by mouth  in the afternoon   Yes [provider]     Critical care time: 42 minutes     CRITICAL CARE Performed by: Cristal Generous   Total critical care time: 42 minutes  Critical care time was exclusive of separately billable procedures and treating other patients. Critical care was necessary to treat or prevent imminent or life-threatening deterioration.  Critical care was time spent personally by me on the following activities: development of treatment plan with patient and/or surrogate as well as nursing, discussions with consultants, evaluation of patient's response to treatment, examination of patient, obtaining history from patient or surrogate, ordering and performing treatments and interventions, ordering and review of laboratory studies, ordering and review of  radiographic studies, pulse oximetry and re-evaluation of patient's condition.  Eliseo Gum MSN, AGACNP-BC [place] for pager 07/08/2022, 3:12 PM

## 2022-07-08 NOTE — Progress Notes (Signed)
EKG CRITICAL VALUE     12 lead EKG performed.  Critical value noted.  Beckie Salts, RN notified.   Warren Lacy, CCT 07/08/2022 1:58 PM

## 2022-07-09 ENCOUNTER — Inpatient Hospital Stay (HOSPITAL_COMMUNITY): Payer: Medicare Other

## 2022-07-09 DIAGNOSIS — I719 Aortic aneurysm of unspecified site, without rupture: Secondary | ICD-10-CM | POA: Diagnosis not present

## 2022-07-09 DIAGNOSIS — E875 Hyperkalemia: Secondary | ICD-10-CM

## 2022-07-09 DIAGNOSIS — R579 Shock, unspecified: Secondary | ICD-10-CM

## 2022-07-09 DIAGNOSIS — I5022 Chronic systolic (congestive) heart failure: Secondary | ICD-10-CM

## 2022-07-09 DIAGNOSIS — D696 Thrombocytopenia, unspecified: Secondary | ICD-10-CM | POA: Diagnosis not present

## 2022-07-09 DIAGNOSIS — I351 Nonrheumatic aortic (valve) insufficiency: Secondary | ICD-10-CM | POA: Diagnosis not present

## 2022-07-09 DIAGNOSIS — Z9911 Dependence on respirator [ventilator] status: Secondary | ICD-10-CM | POA: Diagnosis not present

## 2022-07-09 DIAGNOSIS — I2510 Atherosclerotic heart disease of native coronary artery without angina pectoris: Secondary | ICD-10-CM | POA: Diagnosis not present

## 2022-07-09 DIAGNOSIS — Z95828 Presence of other vascular implants and grafts: Secondary | ICD-10-CM | POA: Diagnosis not present

## 2022-07-09 LAB — CBC
HCT: 26.3 % — ABNORMAL LOW (ref 39.0–52.0)
HCT: 26.3 % — ABNORMAL LOW (ref 39.0–52.0)
Hemoglobin: 9.3 g/dL — ABNORMAL LOW (ref 13.0–17.0)
Hemoglobin: 8.9 g/dL — ABNORMAL LOW (ref 13.0–17.0)
MCH: 37.1 pg — ABNORMAL HIGH (ref 26.0–34.0)
MCH: 35.7 pg — ABNORMAL HIGH (ref 26.0–34.0)
MCHC: 35.4 g/dL (ref 30.0–36.0)
MCHC: 33.8 g/dL (ref 30.0–36.0)
MCV: 104.8 fL — ABNORMAL HIGH (ref 80.0–100.0)
MCV: 105.6 fL — ABNORMAL HIGH (ref 80.0–100.0)
Platelets: 102 10*3/uL — ABNORMAL LOW (ref 150–400)
Platelets: 107 10*3/uL — ABNORMAL LOW (ref 150–400)
RBC: 2.51 MIL/uL — ABNORMAL LOW (ref 4.22–5.81)
RBC: 2.49 MIL/uL — ABNORMAL LOW (ref 4.22–5.81)
RDW: 12.7 % (ref 11.5–15.5)
RDW: 13 % (ref 11.5–15.5)
WBC: 12.3 10*3/uL — ABNORMAL HIGH (ref 4.0–10.5)
WBC: 14.3 10*3/uL — ABNORMAL HIGH (ref 4.0–10.5)
nRBC: 0 % (ref 0.0–0.2)
nRBC: 0 % (ref 0.0–0.2)

## 2022-07-09 LAB — BASIC METABOLIC PANEL
Anion gap: 10 (ref 5–15)
Anion gap: 11 (ref 5–15)
BUN: 12 mg/dL (ref 8–23)
BUN: 16 mg/dL (ref 8–23)
CO2: 20 mmol/L — ABNORMAL LOW (ref 22–32)
CO2: 21 mmol/L — ABNORMAL LOW (ref 22–32)
Calcium: 8.1 mg/dL — ABNORMAL LOW (ref 8.9–10.3)
Calcium: 8.5 mg/dL — ABNORMAL LOW (ref 8.9–10.3)
Chloride: 105 mmol/L (ref 98–111)
Chloride: 99 mmol/L (ref 98–111)
Creatinine, Ser: 0.84 mg/dL (ref 0.61–1.24)
Creatinine, Ser: 1.01 mg/dL (ref 0.61–1.24)
GFR, Estimated: >60 mL/min (ref >60)
GFR, Estimated: >60 mL/min (ref >60)
Glucose, Bld: 110 mg/dL — ABNORMAL HIGH (ref 70–99)
Glucose, Bld: 145 mg/dL — ABNORMAL HIGH (ref 70–99)
Potassium: 5.4 mmol/L — ABNORMAL HIGH (ref 3.5–5.1)
Potassium: 4.8 mmol/L (ref 3.5–5.1)
Sodium: 135 mmol/L (ref 135–145)
Sodium: 131 mmol/L — ABNORMAL LOW (ref 135–145)

## 2022-07-09 LAB — GLUCOSE, CAPILLARY
Glucose-Capillary: 100 mg/dL — ABNORMAL HIGH (ref 70–99)
Glucose-Capillary: 110 mg/dL — ABNORMAL HIGH (ref 70–99)
Glucose-Capillary: 110 mg/dL — ABNORMAL HIGH (ref 70–99)
Glucose-Capillary: 111 mg/dL — ABNORMAL HIGH (ref 70–99)
Glucose-Capillary: 126 mg/dL — ABNORMAL HIGH (ref 70–99)
Glucose-Capillary: 143 mg/dL — ABNORMAL HIGH (ref 70–99)

## 2022-07-09 LAB — MAGNESIUM
Magnesium: 2.4 mg/dL (ref 1.7–2.4)
Magnesium: 2.1 mg/dL (ref 1.7–2.4)

## 2022-07-09 MED ORDER — NOREPINEPHRINE 4 MG/250ML-% IV SOLN
INTRAVENOUS | Status: AC
  Filled 2022-07-09: qty 250

## 2022-07-09 MED ORDER — SODIUM ZIRCONIUM CYCLOSILICATE 5 G PO PACK: 5.0000 g | PACK | Freq: Once | ORAL | Status: DC

## 2022-07-09 MED ORDER — ASPIRIN 81 MG PO TBEC
81.0000 mg | DELAYED_RELEASE_TABLET | Freq: Every day | ORAL | Status: DC
  Administered 2022-07-09 – 2022-08-13 (×36): 81 mg via ORAL
  Filled 2022-07-09 (×36): qty 1

## 2022-07-09 MED ORDER — NOREPINEPHRINE 16 MG/250ML-% IV SOLN
0.0000 ug/min | INTRAVENOUS | Status: DC
  Administered 2022-07-09: 2 ug/min via INTRAVENOUS
  Filled 2022-07-09: qty 250

## 2022-07-09 MED ORDER — FUROSEMIDE 10 MG/ML IJ SOLN
20.0000 mg | Freq: Once | INTRAMUSCULAR | Status: AC
  Administered 2022-07-09: 20 mg via INTRAVENOUS
  Filled 2022-07-09: qty 2

## 2022-07-09 MED ORDER — INSULIN ASPART 100 UNIT/ML IJ SOLN
0.0000 [IU] | INTRAMUSCULAR | Status: DC
  Administered 2022-07-09 – 2022-07-10 (×6): 2 [IU] via SUBCUTANEOUS

## 2022-07-09 NOTE — Consult Note (Addendum)
Advanced Heart Failure Team Consult Note   Primary Physician: Willey Blade, MD PCP-Cardiologist:  Skeet Latch, MD  Reason for Consultation: Post-operative management of systolic heart failure, s/p Bentall + CABG x 1  HPI:    Joe [name] is seen today for post-operative management of systolic heart failure, s/p Bentall and CABG x 1,  at the request of Dr. Lavonna Monarch, [facility].   66 y/o male recently found to have severe LV dysfunction in setting of markedly dilated aortic root (67 mm) and severe AI (tricuspid AoV). Echo 9/14 EF 25-30%, LV severely dilated, severe AI, Global HK, RV ok. LBBB also noted. [facility] 9/18 showed single vessel CAD w/ 60-70% pRCA stenosis, patent LM, LAD and LCx. RHC w/ low filling pressures and preserved CO, CI 2.53.   Underwent Bentall procedure + CABG x 1, SVR-RCA, on 9/26. Intraoperative TEE showed LVEF 20-25%, RV normal. Normal functioning bioprosthetic AoV w/ no AI and no perivalvular leak post repair.   Extubated overnight.   POD#1. AHF team asked to assist w/ post-operative management of systolic heart failure.   Current gtts: NE 5 + EPi 1.5   Swan #s  PAP 28/12 (19) CO 5.39 CI 2.96  No CVP set up   MAP 72  Rhythm: LBBB ? Junctional on tele. HR 70s. Morning EKG NSR w/ PACs.    O2 sats 97% on 2L  Hgb stable at 9.3   K elevated 5.4. SCr ok 0.84. Good UOP, 3.5L out yesterday. 16 lb above pre-op wt. Primary team gave 20 mg IV Lasix x1 this morning.   Looks well. Awake in bed and conversant. Post-op pain controlled. Mother present at bedside (pt of Dr. Claris Gladden)   Intraoperative TEE 9/27  Left Ventricle: The left ventricle has severely reduced systolic function, with an ejection fraction of 20-30%. The cavity size was severely dilated. Left ventrical global hypokinesis without regional wall motion abnormalities. There is mild concentric left ventricular hypertrophy. Right Ventricle: The right ventricle has normal systolic function.  The cavity was normal. There is no increase in right ventricular wall thickness. Aortic Valve (post repair): A bioprosthetic bioprosthetic valve was placed, leaflets  are freely mobile and leaflets thin. No regurgitation post repair. No perivalvular leak noted.    Review of Systems: [y] = yes, _0  = no   General: Weight gain _1 ; Weight loss _2 ; Anorexia _3 ; Fatigue _4 ; Fever _5 ; Chills _6 ; Weakness _7   Cardiac: Chest pain/pressure _8 ; Resting SOB _9 ; Exertional SOB _10 ; Orthopnea _11 ; Pedal Edema _12 ; Palpitations _13 ; Syncope _14 ; Presyncope _15 ; Paroxysmal nocturnal dyspnea_16   Pulmonary: Cough _17 ; Wheezing_18 ; Hemoptysis_19 ; Sputum _20 ; Snoring _21   GI: Vomiting_22 ; Dysphagia_23 ; Melena_24 ; Hematochezia _25 ; Heartburn_26 ; Abdominal pain _27 ; Constipation _28 ; Diarrhea _29 ; BRBPR _30   GU: Hematuria_31 ; Dysuria _32 ; Nocturia_33   Vascular: Pain in legs with walking _34 ; Pain in feet with lying flat _35 ; Non-healing sores _36 ; Stroke _37 ; TIA _38 ; Slurred speech _39 ;  Neuro: Headaches_40 ; Vertigo_41 ; Seizures_42 ; Paresthesias_43 ;Blurred vision _44 ; Diplopia _45 ; Vision changes _46   Ortho/Skin: Arthritis _47 ; Joint pain _48 ; Muscle pain _49 ; Joint swelling _50 ; Back Pain _51 ; Rash _52   Psych: Depression_53 ; Anxiety_54   Heme: Bleeding problems _55 ; Clotting disorders _56 ; Anemia _57   Endocrine: Diabetes _0 ; Thyroid dysfunction_1   Home Medications Prior to Admission medications   Medication Sig Start Date End Date Taking? Authorizing Provider  acetaminophen (TYLENOL) 325 MG tablet Take 650 mg by mouth every 6 (six) hours as needed for mild pain.   Yes [provider]  aspirin EC 81 MG tablet Take 1 tablet (81 mg total) by mouth daily. Swallow whole. 07/01/22  Yes Earnie Larsson, NP  atorvastatin (LIPITOR) 40 MG tablet Take 1 tablet (40 mg total) by mouth daily. 07/01/22  Yes Earnie Larsson, NP  Cholecalciferol (VITAMIN D3) 250 MCG (10000 UT) capsule Take 10,000 Units by mouth  daily.   Yes [provider]  Coenzyme Q10 (CO Q-10) 100 MG CAPS Take 100 mg by mouth daily.   Yes [provider]  empagliflozin (JARDIANCE) 10 MG TABS tablet Take 1 tablet (10 mg total) by mouth daily. 07/01/22  Yes Earnie Larsson, NP  fluticasone (FLONASE) 50 MCG/ACT nasal spray Place 2 sprays into both nostrils daily. Patient taking differently: Place 2 sprays into both nostrils daily as needed for allergies. 01/09/20  Yes Minette Brine, FNP  furosemide (LASIX) 20 MG tablet Take 1 tablet (20 mg total) by mouth as needed for edema. 05/26/22 08/24/22 Yes Skeet Latch, MD  OVER THE COUNTER MEDICATION Take 2 capsules by mouth daily. EPA & DHA   Yes [provider]  sacubitril-valsartan (ENTRESTO) 24-26 MG Take 1 tablet by mouth 2 (two) times daily. 06/30/22  Yes Earnie Larsson, NP  spironolactone (ALDACTONE) 25 MG tablet Take 0.5 tablets (12.5 mg total) by mouth daily. 07/01/22  Yes Earnie Larsson, NP  tamsulosin (FLOMAX) 0.4 MG CAPS capsule Take 0.4 mg by mouth 2 (two) times daily. 04/11/22  Yes [provider]  UNABLE TO FIND Take 1 tablet by mouth daily. Med Name: Neuro optimizer   Yes [provider]  UNABLE TO FIND Take 1-2 tablets by mouth See admin instructions. 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   Murmur 05/26/2022   Osteoarthritis of left hip 01/16/2014   Osteoarthritis of right hip 05/15/2014    Primary localized osteoarthrosis of left shoulder 09/21/2017   PVC (premature ventricular contraction) 05/26/2022   Right ureteral stone    Speech impediment    Thoracic ascending aortic aneurysm ([place])    Umbilical hernia     Past Surgical History: Past Surgical History:  Procedure Laterality Date   BACK SURGERY     CYSTOSCOPY W/ URETERAL STENT REMOVAL Right 04/19/2013   Procedure: CYSTOSCOPY WITH STENT REMOVAL;  Surgeon: Molli Hazard, MD;  Location[facility];  Service: Urology;  Laterality: Right;   CYSTOSCOPY WITH RETROGRADE PYELOGRAM, URETEROSCOPY AND STENT PLACEMENT Right 04/19/2013   Procedure: CYSTOSCOPY WITH RETROGRADE PYELOGRAM, URETEROSCOPY ;  Surgeon: Dennard Schaumann  Jasmine December, MD;  Location[facility];  Service: Urology;  Laterality: Right;   CYSTOSCOPY WITH STENT PLACEMENT Right 03/24/2013   Procedure: CYSTOSCOPY WITH STENT PLACEMENT;  Surgeon: Hanley Ben, MD;  Location: [facility];  Service: Urology;  Laterality: Right;   HOLMIUM LASER APPLICATION Right 02/12/6502   Procedure: HOLMIUM LASER APPLICATION;  Surgeon: Molli Hazard, MD;  Location[facility];  Service: Urology;  Laterality: Right;   INGUINAL HERNIA REPAIR Left 03-04-2005   POSTERIOR LUMBAR FUSION  08-27-2011   L4 -- L5   RIGHT HEART CATH AND CORONARY ANGIOGRAPHY N/A 06/29/2022   Procedure: RIGHT HEART CATH AND CORONARY ANGIOGRAPHY;  Surgeon: Sherren Mocha, MD;  Location: [facility];  Service: Cardiovascular;  Laterality: N/A;   RIGHT SHOULDER HEMIARTHROPLASTY  08-26-2010   OA   SHOULDER HEMI-ARTHROPLASTY Left 09/21/2017   Procedure: SHOULDER HEMI-ARTHROPLASTY;  Surgeon: Marchia Bond, MD;  Location: [place];  Service: Orthopedics;  Laterality: Left;   TONSILLECTOMY  AS CHILD   TOTAL HIP ARTHROPLASTY Left 01/16/2014   Procedure: TOTAL HIP ARTHROPLASTY;  Surgeon: Johnny Bridge, MD;  Location: [place];  Service: Orthopedics;  Laterality: Left;    TOTAL HIP ARTHROPLASTY Right 05/15/2014   Procedure: RIGHT TOTAL HIP ARTHROPLASTY;  Surgeon: Johnny Bridge, MD;  Location: [place];  Service: Orthopedics;  Laterality: Right;   TOTAL SHOULDER ARTHROPLASTY Right 2011    Family History: Family History  Problem Relation Age of Onset   Heart failure Mother    Atrial fibrillation Mother    Heart failure Maternal Grandmother    Atrial fibrillation Maternal Grandmother     Social History: Social History   Socioeconomic History   Marital status: Single    Spouse name: Not on file   Number of children: Not on file   Years of education: Not on file   Highest education level: Not on file  Occupational History   Occupation: unemployed  Tobacco Use   Smoking status: Never   Smokeless tobacco: Never  Vaping Use   Vaping Use: Never used  Substance and Sexual Activity   Alcohol use: No   Drug use: No   Sexual activity: Not Currently  Other Topics Concern   Not on file  Social History Narrative   Pt with delayed mental capabilities, 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  Lives with parents.   Social Determinants of Health   Financial Resource Strain: Low Risk  (10/11/2019)   Overall Financial Resource Strain (CARDIA)    Difficulty of Paying Living Expenses: Not hard at all  Food Insecurity: No Food Insecurity (06/29/2022)   Hunger Vital Sign    Worried About Running Out of Food in the Last Year: Never true    Ran Out of Food in the Last Year: Never true  Transportation Needs: No Transportation Needs (06/29/2022)   PRAPARE - Hydrologist (Medical): No    Lack of Transportation (Non-Medical): No  Physical Activity: Sufficiently Active (10/11/2019)   Exercise Vital Sign    Days of Exercise per Week: 5 days    Minutes of Exercise per Session: 40 min  Stress: Stress Concern Present (10/11/2019)   [place]    Feeling of Stress : To some extent  Social Connections: Not on file    Allergies:  Allergies  Allergen Reactions   Codeine Nausea Only   Levaquin [Levofloxacin In D5w] Diarrhea    Objective:    Vital Signs:   Temp:  [94.6 F (34.8 C)-99.7 F (37.6 C)] 99 F (37.2 C) (09/28 0800) Pulse Rate:  [64-90] 75 (09/28 0800) Resp:  [11-31] 22 (09/28 0800) BP: (80-123)/(50-90) 91/61 (09/28 0800) SpO2:  [94 %-100 %] 96 % (09/28 0800) Arterial Line BP: (89-126)/(52-73) 101/58 (09/28 0800) FiO2 (%):  [40 %-50 %] 40 % (09/27 1718) Weight:  [78.5 kg] 78.5 kg (09/28 0330) Last BM Date :  (PTA)  Weight change: Filed Weights   07/08/22 0606 07/09/22 0330  Weight: 71.2 kg 78.5 kg    Intake/Output:   Intake/Output Summary (Last 24 hours) at 07/09/2022 0936 Last data filed at 07/09/2022 0800 Gross per 24 hour  Intake 7010.57 ml  Output 6406 ml  Net 604.57 ml      Physical Exam    General:  Well appearing. Awake and conversant. No resp difficulty HEENT: normal Neck: supple. JVP 12 cm. + Rt IJ Swan Carotids 2+ bilat; no bruits. No lymphadenopathy or thyromegaly appreciated. Cor: PMI nondisplaced. Regular rate & rhythm. No rubs, gallops or murmurs. + sternal dressing, + CTs + EPW Lungs: decreased BS at the bases bilaterally  Abdomen: soft, nontender, nondistended. No hepatosplenomegaly. No bruits or masses. Good bowel sounds. Extremities: no cyanosis, clubbing, rash, 1+ b/l LE edema Neuro: alert & orientedx3, cranial nerves grossly intact. moves all 4 extremities w/o difficulty. Affect pleasant GU: + foley, urine clear    Telemetry   LBBB, ? Junctional rhythm 70s   EKG    NSR w/ PACs, 1st deg AVB, LBBB 73 bpm, personally reviewed   Labs   Basic Metabolic Panel: Recent Labs  Lab 07/07/22 0900 07/08/22 0810 07/08/22 0946 07/08/22 1029 07/08/22 1057 07/08/22 1130 07/08/22 1156 07/08/22 1348 07/08/22 1742  07/08/22 1919 07/08/22 1921 07/09/22 0302  NA 137   < > 137   < > 135   < > 137 138 139 139 139 135  K 4.5   < > 5.3*   < > 4.8   < > 4.6 3.8 5.0 4.8 4.8 5.4*  CL 109   < > 106  --  104  --  103  --   --  111  --  105  CO2 20*  --   --   --   --   --   --   --   --  22  --  20*  GLUCOSE 108*   < > 145*  --  135*  --  151*  --   --  117*  --  110*  BUN 11   < > 13  --  13  --  12  --   --  11  --  12  CREATININE 0.88   < > 0.60*  --  0.60*  --  0.60*  --   --  0.94  --  0.84  CALCIUM 9.4  --   --   --   --   --   --   --   --  7.6*  --  8.1*  MG  --   --   --   --   --   --   --   --   --  3.0*  --  2.4   < > = values in this interval not displayed.    Liver Function Tests: Recent Labs  Lab  07/07/22 0900  AST 23  ALT 23  ALKPHOS 67  BILITOT 1.2  PROT 6.5  ALBUMIN 3.9   No results for input(s): "LIPASE", "AMYLASE" in the last 168 hours. No results for input(s): "AMMONIA" in the last 168 hours.  CBC: Recent Labs  Lab 07/07/22 0900 07/08/22 0810 07/08/22 1100 07/08/22 1130 07/08/22 1350 07/08/22 1742 07/08/22 1919 07/08/22 1921 07/09/22 0302  WBC 4.9  --   --   --  16.5*  --  12.6*  --  12.3*  HGB 15.3   < > 11.9*   < > 11.4* 8.2* 9.3* 8.5* 9.3*  HCT 43.1   < > 32.3*   < > 32.5* 24.0* 26.1* 25.0* 26.3*  MCV 102.1*  --   --   --  103.2*  --  102.8*  --  104.8*  PLT 210  --  136*  --  134*  --  100*  --  102*   < > = values in this interval not displayed.    Cardiac Enzymes: No results for input(s): "CKTOTAL", "CKMB", "CKMBINDEX", "TROPONINI" in the last 168 hours.  BNP: BNP (last 3 results) Recent Labs    06/25/22 1829  BNP 436.2*    ProBNP (last 3 results) No results for input(s): "PROBNP" in the last 8760 hours.   CBG: Recent Labs  Lab 07/08/22 2110 07/08/22 2257 07/09/22 0056 07/09/22 0300 07/09/22 0754  GLUCAP 137* 111* 110* 111* 110*    Coagulation Studies: Recent Labs    07/07/22 0900 07/08/22 1350  LABPROT 13.9 20.1*  INR 1.1 1.7*      Imaging   DG Chest Port 1 View  Result Date: 07/09/2022 CLINICAL DATA:  Status post ascending aortic aneurysm repair. EXAM: PORTABLE CHEST 1 VIEW COMPARISON:  07/08/2022 FINDINGS: Pulmonary arterial catheter is again noted entering from a right IJ approach. The tip is directed towards the right main pulmonary artery. Mediastinal drain is in place. Unchanged cardiac enlargement. No interstitial edema. Atelectasis is noted within the right base. Left lower lung opacification is unchanged likely reflecting a combination of pleural effusion and atelectasis. Status post bilateral shoulder arthroplasty. IMPRESSION: 1. Cardiac enlargement, stable. 2. Persistent left lower lung opacification and right base subsegmental atelectasis. No pneumothorax visualized. Electronically Signed   By: Kerby Moors M.D.   On: 07/09/2022 07:52   DG Chest Port 1 View  Result Date: 07/08/2022 CLINICAL DATA:  Status post placement of prosthetic aortic valve EXAM: PORTABLE CHEST 1 VIEW COMPARISON:  07/07/2022 FINDINGS: Transverse diameter of heart is increased. There is interval placement of prosthetic aortic valve. Tip of endotracheal tube is 4.7 cm above the carina. Enteric tube is noted traversing the esophagus. Tip of right IJ Swan-Ganz catheter is seen at the bifurcation of main pulmonary artery. Epicardial leads are noted overlying the left side of heart. Leads extending to the left infraclavicular region without demonstrable battery. Mediastinal drain is seen. There is increased density in left lower lung field with blunting of left lateral CP angle. Right lateral CP angle is indistinct. There is no pneumothorax. Possible small pockets of air are noted in left side of neck. There is previous arthroplasty in both shoulders. IMPRESSION: Cardiomegaly. There are no signs of pulmonary edema. There is increased density in left lower lung fields suggesting pleural effusion and possibly underlying atelectasis. There is minimal  right pleural effusion. Support devices as described in the body of the report. Electronically Signed   By: Elmer Picker M.D.   On: 07/08/2022 13:45  Medications:     Current Medications:  acetaminophen  1,000 mg Oral Q6H   Or   acetaminophen (TYLENOL) oral liquid 160 mg/5 mL  1,000 mg Per Tube Q6H   aspirin EC  81 mg Oral Daily   atorvastatin  40 mg Oral Daily   bisacodyl  10 mg Oral Daily   Or   bisacodyl  10 mg Rectal Daily   Chlorhexidine Gluconate Cloth  6 each Topical Daily   docusate sodium  200 mg Oral Daily   insulin aspart  0-24 Units Subcutaneous Q4H   [START ON 07/10/2022] pantoprazole  40 mg Oral Daily   sodium chloride flush  10-40 mL Intracatheter Q12H   sodium chloride flush  3 mL Intravenous Q12H   tamsulosin  0.4 mg Oral BID    Infusions:  sodium chloride 20 mL/hr at 07/09/22 0800   sodium chloride     sodium chloride 10 mL/hr at 07/09/22 0800   albumin human 999 mL/hr at 07/08/22 2200    ceFAZolin (ANCEF) IV Stopped (07/09/22 0533)   clevidipine     dexmedetomidine (PRECEDEX) IV infusion Stopped (07/08/22 1827)   epinephrine 4 mcg/min (07/09/22 0800)   insulin Stopped (07/09/22 0133)   lactated ringers     lactated ringers 10 mL/hr at 07/09/22 0800   norepinephrine (LEVOPHED) Adult infusion 8 mcg/min (07/09/22 0800)   phenylephrine (NEO-SYNEPHRINE) Adult infusion     vasopressin 0.03 Units/min (07/09/22 0800)      Patient Profile   66 y/o male recently found to have severe LV dysfunction in setting of markedly dilated aortic root (67 mm) and severe AI (tricuspid AoV) and single vessel CAD, now s/p Bentall and CABG x 1 w/ SVG-RCA. AHF team consulted to assist w/ post-operative management of systolic heart failure.   Assessment/Plan   Ascending Aortic Aneurysm/ Severe AI  - CTA  w/ severe dilation at the sinus of Valsalva, 67 mm. Moderate dilation of mid ascending aorta, 44 mm. Tricuspid AoV - s/p Bentall 9/27 - intraoperative TEE, no AI,  no perivalvular leak post repair   2. CAD - single vessel, 60-70% pRCA  - s/p CABG x 1, SVR- RCA  - ASA + statin  - hold initiation of ? blocker for now until stable off pressors/inotropes   3. Chronic Systolic Heart Failure  -  newly diagnosed; Echo 9/23 EF 25-30%, LV severely dilated, severe AI, Global HK, RV ok  - NICM, severity out of proportion to degree of CAD  - likely valvular CM in setting of severe AI +/- LBBB CM  - now post op AVR + CABG. Intraoperative TEE LVEF 20-25%, RV ok - POD1, Currently on NE 6 + Epi 2. Hemodynamics good on swan, CI 2.96  - wean inotropes/pressors as tolerated  - if swan out today, consider placement of PICC for cooximetry and CVP monitoring  - begin gentle diuresis today w/ IV Lasix  - will slowly add GDMT as tolerated once stable off pressors  - will need f/u echo in ~3 months post AVR and after optimization of meds. If EF remains < 35%, will need CRT-D   4. Hyperkalemia - K 5.4 on am labs. SCr ok  - begin IV diuresis per above - f/u BMP. Lokelma if needed   5. LBBB - may need eventual CRT-D if EF does not improve     Length of Stay: [place], PA-C  07/09/2022, 9:36 AM  Advanced Heart Failure Team Pager [phone] (M-F; 7a - 5p)  Please contact [facility] for night-coverage after hours (4p -7a ) and weekends on [email or web address]  Agree with above.   Underwent Bentall with AoV replacement, CABG x 1 and epicardial lead placement yesterday.  Now extubated. Luiz Blare out. On low dose NE and EPI. Hemodynamics look good. Remains in NSR.   Starting to pass gas. CT drainage minimal.   General:  Sitting up in bed No resp difficulty HEENT: normal Neck: supple. RIJ MAC catheter. Carotids 2+ bilat; no bruits. No lymphadenopathy or thryomegaly appreciated. Cor: Sternal dressing ok +CTs Regular rate & rhythm. No rubs, gallops or murmurs. Lungs: clear Abdomen: soft, nontender, nondistended. Hypoactive bowel sounds. Extremities: no cyanosis,  clubbing, rash, edema Neuro: alert & orientedx3, cranial nerves grossly intact. moves all 4 extremities w/o difficulty. Affect pleasant  Stable POD#1. Continue to wean inotropes as tolerated. Gentle diuresis. Mobilize. Start GDMT when off pressors. Eventual CRT to hook up to epicardial lead.   CRITICAL CARE Performed by: Glori Bickers  Total critical care time: 35 minutes  Critical care time was exclusive of separately billable procedures and treating other patients.  Critical care was necessary to treat or prevent imminent or life-threatening deterioration.  Critical care was time spent personally by me (independent of midlevel providers or residents) on the following activities: development of treatment plan with patient and/or surrogate as well as nursing, discussions with consultants, evaluation of patient's response to treatment, examination of patient, obtaining history from patient or surrogate, ordering and performing treatments and interventions, ordering and review of laboratory studies, ordering and review of radiographic studies, pulse oximetry and re-evaluation of patient's condition.  Glori Bickers, MD  4:46 PM

## 2022-07-09 NOTE — Progress Notes (Signed)
NAME:  Joe [name], MRN:  [id number], DOB:  [birth year], LOS: 1 ADMISSION DATE:  07/08/2022, CONSULTATION DATE:  07/08/22 REFERRING MD:  Lavonna Monarch - CVTS, CHIEF COMPLAINT:  s/p  Aortic root replacement, CABG x1   History of Present Illness:  66 yo M PMH CAD, aortic root aneurysm with severe aortic insufficiency, cardiomyopathy presented to [facility] hospital 07/08/22 for elective CABG x1 and aortic root replacement.   Cross clamp time 111 min Total pump time 153 minutes  EBL 1491m   Received 870 ml cellsaver and about 3.5 L crystalloid/colloid   Arrives to the ICU intubated post op.   PCCM is consulted in this setting   Pertinent  Medical History  CAD Aortic root aneurysm Aortic Insufficiency Cardiomyopathy   Significant Hospital Events: Including procedures, antibiotic start and stop dates in addition to other pertinent events   Elective CABG x1 and aortic root replacement. ICU admission after case  9/28 now extubated, low dose pressors   Interim History / Subjective:   Cross clamp time 111 min Total pump time 153 minutes  EBL 14128m  Received 870 ml cellsaver and about 3.5 L crystalloid/colloid   About [id number] from chest tube in the past hour, approx [id number]   Objective   Blood pressure (!) 100/58, pulse 78, temperature 98.8 F (37.1 C), resp. rate (!) 28, height '5\' 7"'$  (1.702 m), weight 78.5 kg, SpO2 94 %. PAP: (13-34)/(6-20) 34/14 CO:  [3.2 L/min-5 L/min] 5 L/min CI:  [1.7 L/min/m2-2.7 L/min/m2] 2.7 L/min/m2  Vent Mode: CPAP;PSV FiO2 (%):  [40 %-50 %] 40 % Set Rate:  [4 bmp-18 bmp] 4 bmp Vt Set:  [530 mL] 530 mL PEEP:  [5 cmH20] 5 cmH20 Pressure Support:  [10 cmH20] 10 cmH20   Intake/Output Summary (Last 24 hours) at 07/09/2022 [id number] data filed at 07/09/2022 0700 Gross per 24 hour  Intake 8219.5 ml  Output 6331 ml  Net 1888.5 ml    Filed Weights   07/08/22 0606 07/09/22 0330  Weight: 71.2 kg 78.5 kg    General:  elderly M, awake and alert in no  distress HEENT: MM pink/moist, sclera anicteric Neuro: awake, alert, conversational and following commands CV: s1s2 rrr, no m/r/g PULM:  no distress on [place], no rhonchi or wheezing, mediastinal drains in place with serous, blood tinged fluid GI: soft, non-tender Extremities: warm/dry, no edema  Skin: no rashes or lesions     Resolved Hospital Problem list     Assessment & Plan:   07/09/22: POD 1 CABGx1 and aortic root replacement with Dr. WeldLavonna MonarchExpected post-operative ventilator management Small L>R pleural effusions P -rapidly weaned and successfully extubated yesterday -early mobility, multimodal analgesia to prevent splinting, IS -prn analgesia    CAD s/p CABG x1 Aortic root aneurysm with severe AI, s/p aortic root replacement  Shock -- cardiogenic shock +/- post-cabg vasoplegia  P -chest tube management, pacing wires, and transfusion need per CVTS -complete txa -SSI -continue pressors for MAP > 65 -trend labs  -strict UOP, cont foley     Expected post op ABLA -EBL 1400ml93mceived 870 cellsaver P -Hgb stable at 9.3 -transfusion decisions per primary   Leukocytosis -expect this is reactive in setting of above P -received prophylactic abx, WBC stable, monitor for signs of infection  *PCCM will continue to follow along while patient is in the ICU   Best Practice (right click and "Reselect all SmartList Selections" daily)   Diet/type: Regular consistency (see orders) DVT prophylaxis:  SCD GI prophylaxis: H2B Lines: Central line, Arterial Line, and yes and it is still needed Foley:  Yes, and it is still needed Code Status:  full code Last date of multidisciplinary goals of care discussion [per primary]  Labs   CBC: Recent Labs  Lab 07/07/22 0900 07/08/22 0810 07/08/22 1100 07/08/22 1130 07/08/22 1350 07/08/22 1742 07/08/22 1919 07/08/22 1921 07/09/22 0302  WBC 4.9  --   --   --  16.5*  --  12.6*  --  12.3*  HGB 15.3   < > 11.9*   < >  11.4* 8.2* 9.3* 8.5* 9.3*  HCT 43.1   < > 32.3*   < > 32.5* 24.0* 26.1* 25.0* 26.3*  MCV 102.1*  --   --   --  103.2*  --  102.8*  --  104.8*  PLT 210  --  136*  --  134*  --  100*  --  102*   < > = values in this interval not displayed.     Basic Metabolic Panel: Recent Labs  Lab 07/07/22 0900 07/08/22 0810 07/08/22 0946 07/08/22 1029 07/08/22 1057 07/08/22 1130 07/08/22 1156 07/08/22 1348 07/08/22 1742 07/08/22 1919 07/08/22 1921 07/09/22 0302  NA 137   < > 137   < > 135   < > 137 138 139 139 139 135  K 4.5   < > 5.3*   < > 4.8   < > 4.6 3.8 5.0 4.8 4.8 5.4*  CL 109   < > 106  --  104  --  103  --   --  111  --  105  CO2 20*  --   --   --   --   --   --   --   --  22  --  20*  GLUCOSE 108*   < > 145*  --  135*  --  151*  --   --  117*  --  110*  BUN 11   < > 13  --  13  --  12  --   --  11  --  12  CREATININE 0.88   < > 0.60*  --  0.60*  --  0.60*  --   --  0.94  --  0.84  CALCIUM 9.4  --   --   --   --   --   --   --   --  7.6*  --  8.1*  MG  --   --   --   --   --   --   --   --   --  3.0*  --  2.4   < > = values in this interval not displayed.    GFR: Estimated Creatinine Clearance: 78.7 mL/min (by C-G formula based on SCr of 0.84 mg/dL). Recent Labs  Lab 07/07/22 0900 07/08/22 1350 07/08/22 1919 07/09/22 0302  WBC 4.9 16.5* 12.6* 12.3*     Liver Function Tests: Recent Labs  Lab 07/07/22 0900  AST 23  ALT 23  ALKPHOS 67  BILITOT 1.2  PROT 6.5  ALBUMIN 3.9    No results for input(s): "LIPASE", "AMYLASE" in the last 168 hours. No results for input(s): "AMMONIA" in the last 168 hours.  ABG    Component Value Date/Time   PHART 7.353 07/08/2022 1921   PCO2ART 35.7 07/08/2022 1921   PO2ART 94 07/08/2022 1921   HCO3 19.9 (L) 07/08/2022 1921   TCO2 21 (L)  07/08/2022 1921   ACIDBASEDEF 5.0 (H) 07/08/2022 1921   O2SAT 97 07/08/2022 1921     Coagulation Profile: Recent Labs  Lab 07/07/22 0900 07/08/22 1350  INR 1.1 1.7*     Cardiac  Enzymes: No results for input(s): "CKTOTAL", "CKMB", "CKMBINDEX", "TROPONINI" in the last 168 hours.  HbA1C: Hgb A1c MFr Bld  Date/Time Value Ref Range Status  07/07/2022 08:28 AM 5.3 4.8 - 5.6 % Final    Comment:    (NOTE) Pre diabetes:          5.7%-6.4%  Diabetes:              >6.4%  Glycemic control for   <7.0% adults with diabetes     CBG: Recent Labs  Lab 07/08/22 2110 07/08/22 2257 07/09/22 0056 07/09/22 0300 07/09/22 0754  GLUCAP 137* 111* 110* 111* 110*     Review of Systems:   Unable to obtain, intubated and sedated   Past Medical History:  He,  has a past medical history of Anxiety, Arthritis, Cognitive deficits, Congenital brain damage ([place]), Constipation, Coronary artery disease, Expressive speech delay, History of kidney stones, Hypertension, LBBB (left bundle branch block) (05/26/2022), Lower extremity edema (05/26/2022), Mental retardation, Murmur (05/26/2022), Osteoarthritis of left hip (01/16/2014), Osteoarthritis of right hip (05/15/2014), Primary localized osteoarthrosis of left shoulder (09/21/2017), PVC (premature ventricular contraction) (05/26/2022), Right ureteral stone, Speech impediment, Thoracic ascending aortic aneurysm ([place]), and Umbilical hernia.   Surgical History:   Past Surgical History:  Procedure Laterality Date   BACK SURGERY     CYSTOSCOPY W/ URETERAL STENT REMOVAL Right 04/19/2013   Procedure: CYSTOSCOPY WITH STENT REMOVAL;  Surgeon: Molli Hazard, MD;  Location: [facility];  Service: Urology;  Laterality: Right;   CYSTOSCOPY WITH RETROGRADE PYELOGRAM, URETEROSCOPY AND STENT PLACEMENT Right 04/19/2013   Procedure: CYSTOSCOPY WITH RETROGRADE PYELOGRAM, URETEROSCOPY ;  Surgeon: Molli Hazard, MD;  Location: [facility];  Service: Urology;  Laterality: Right;   CYSTOSCOPY WITH STENT PLACEMENT Right 03/24/2013   Procedure: CYSTOSCOPY WITH STENT PLACEMENT;  Surgeon: Hanley Ben, MD;   Location: [facility];  Service: Urology;  Laterality: Right;   HOLMIUM LASER APPLICATION Right 04/14/9448   Procedure: HOLMIUM LASER APPLICATION;  Surgeon: Molli Hazard, MD;  Location: [facility];  Service: Urology;  Laterality: Right;   INGUINAL HERNIA REPAIR Left 03-04-2005   POSTERIOR LUMBAR FUSION  08-27-2011   L4 -- L5   RIGHT HEART CATH AND CORONARY ANGIOGRAPHY N/A 06/29/2022   Procedure: RIGHT HEART CATH AND CORONARY ANGIOGRAPHY;  Surgeon: Sherren Mocha, MD;  Location: [place] [facility];  Service: Cardiovascular;  Laterality: N/A;   RIGHT SHOULDER HEMIARTHROPLASTY  08-26-2010   OA   SHOULDER HEMI-ARTHROPLASTY Left 09/21/2017   Procedure: SHOULDER HEMI-ARTHROPLASTY;  Surgeon: Marchia Bond, MD;  Location: [place];  Service: Orthopedics;  Laterality: Left;   TONSILLECTOMY  AS CHILD   TOTAL HIP ARTHROPLASTY Left 01/16/2014   Procedure: TOTAL HIP ARTHROPLASTY;  Surgeon: Johnny Bridge, MD;  Location: [place];  Service: Orthopedics;  Laterality: Left;   TOTAL HIP ARTHROPLASTY Right 05/15/2014   Procedure: RIGHT TOTAL HIP ARTHROPLASTY;  Surgeon: Johnny Bridge, MD;  Location: [place];  Service: Orthopedics;  Laterality: Right;   TOTAL SHOULDER ARTHROPLASTY Right 2011     Social History:   reports that he has never smoked. He has never used smokeless tobacco. He reports that he does not drink alcohol and does not use drugs.   Family History:  His family history includes Atrial fibrillation in his maternal grandmother and mother; Heart failure in his maternal grandmother and mother.   Allergies Allergies  Allergen Reactions   Codeine Nausea Only   Levaquin [Levofloxacin In D5w] Diarrhea     Home Medications  Prior to Admission medications   Medication Sig Start Date End Date Taking? Authorizing Provider  acetaminophen (TYLENOL) 325 MG tablet Take 650 mg by mouth every 6 (six) hours as needed for mild pain.   Yes [provider]  aspirin EC 81 MG tablet Take  1 tablet (81 mg total) by mouth daily. Swallow whole. 07/01/22  Yes Earnie Larsson, NP  atorvastatin (LIPITOR) 40 MG tablet Take 1 tablet (40 mg total) by mouth daily. 07/01/22  Yes Earnie Larsson, NP  Cholecalciferol (VITAMIN D3) 250 MCG (10000 UT) capsule Take 10,000 Units by mouth daily.   Yes [provider]  Coenzyme Q10 (CO Q-10) 100 MG CAPS Take 100 mg by mouth daily.   Yes [provider]  empagliflozin (JARDIANCE) 10 MG TABS tablet Take 1 tablet (10 mg total) by mouth daily. 07/01/22  Yes Earnie Larsson, NP  fluticasone (FLONASE) 50 MCG/ACT nasal spray Place 2 sprays into both nostrils daily. Patient taking differently: Place 2 sprays into both nostrils daily as needed for allergies. 01/09/20  Yes Minette Brine, FNP  furosemide (LASIX) 20 MG tablet Take 1 tablet (20 mg total) by mouth as needed for edema. 05/26/22 08/24/22 Yes Skeet Latch, MD  OVER THE COUNTER MEDICATION Take 2 capsules by mouth daily. EPA & DHA   Yes [provider]  sacubitril-valsartan (ENTRESTO) 24-26 MG Take 1 tablet by mouth 2 (two) times daily. 06/30/22  Yes Earnie Larsson, NP  spironolactone (ALDACTONE) 25 MG tablet Take 0.5 tablets (12.5 mg total) by mouth daily. 07/01/22  Yes Earnie Larsson, NP  tamsulosin (FLOMAX) 0.4 MG CAPS capsule Take 0.4 mg by mouth 2 (two) times daily. 04/11/22  Yes [provider]  UNABLE TO FIND Take 1 tablet by mouth daily. Med Name: Neuro optimizer   Yes [provider]  UNABLE TO FIND Take 1-2 tablets by mouth See admin instructions. Med Name: Neuro Zac: Take 2 tablets by mouth in morning and 1 tablet by mouth  in the afternoon   Yes [provider]     Critical care time: 38 minutes     CRITICAL CARE Performed by: Otilio Carpen Danner Paulding   Total critical care time: 38 minutes  Critical care time was exclusive of separately billable procedures and treating other patients. Critical care was necessary to treat or prevent imminent or  life-threatening deterioration.  Critical care was time spent personally by me on the following activities: development of treatment plan with patient and/or surrogate as well as nursing, discussions with consultants, evaluation of patient's response to treatment, examination of patient, obtaining history from patient or surrogate, ordering and performing treatments and interventions, ordering and review of laboratory studies, ordering and review of radiographic studies, pulse oximetry and re-evaluation of patient's condition.  Otilio Carpen Darothy Courtright, PA-C [facility] See Amion for pager If no response to pager , please call [phone] until 7pm After 7:00 pm call Elink  [phone]?[place]

## 2022-07-09 NOTE — Discharge Instructions (Addendum)
Discharge Instructions:  1. You may shower, please wash incisions daily with soap and water and keep dry.  If you wish to cover wounds with dressing you may do so but please keep clean and change daily.  No tub baths or swimming until incisions have completely healed.  If your incisions become red or develop any drainage please call our office at [phone]  2. No Driving until cleared by Dr. Nicholos Johns office and you are no longer using narcotic pain medications  3. Monitor your weight daily.. Please use the same scale and weigh at same time... If you gain 5-10 lbs in 48 hours with associated lower extremity swelling, please contact our office at [phone]  4. Fever of 101.5 for at least 24 hours with no source, please contact our office at [phone]  5. Activity- up as tolerated, please walk at least 3 times per day.  Avoid strenuous activity, no lifting, pushing, or pulling with your arms over 8-10 lbs for a minimum of 6 weeks  6. If any questions or concerns arise, please do not hesitate to contact our office at [phone]  Information on my medicine - ELIQUIS (apixaban)  Why was Eliquis prescribed for you? Eliquis was prescribed for you to reduce the risk of a blood clot forming that can cause a stroke if you have a medical condition called atrial fibrillation (a type of irregular heartbeat).  What do You need to know about Eliquis ? Take your Eliquis TWICE DAILY - one tablet in the morning and one tablet in the evening with or without food. If you have difficulty swallowing the tablet whole please discuss with your pharmacist how to take the medication safely.  Take Eliquis exactly as prescribed by your doctor and DO NOT stop taking Eliquis without talking to the doctor who prescribed the medication.  Stopping may increase your risk of developing a stroke.  Refill your prescription before you run out.  After discharge, you should have regular check-up appointments with  your healthcare provider that is prescribing your Eliquis.  In the future your dose may need to be changed if your kidney function or weight changes by a significant amount or as you get older.  What do you do if you miss a dose? If you miss a dose, take it as soon as you remember on the same day and resume taking twice daily.  Do not take more than one dose of ELIQUIS at the same time to make up a missed dose.  Important Safety Information A possible side effect of Eliquis is bleeding. You should call your healthcare provider right away if you experience any of the following: Bleeding from an injury or your nose that does not stop. Unusual colored urine (red or dark brown) or unusual colored stools (red or black). Unusual bruising for unknown reasons. A serious fall or if you hit your head (even if there is no bleeding).  Some medicines may interact with Eliquis and might increase your risk of bleeding or clotting while on Eliquis. To help avoid this, consult your healthcare provider or pharmacist prior to using any new prescription or non-prescription medications, including herbals, vitamins, non-steroidal anti-inflammatory drugs (NSAIDs) and supplements.  This website has more information on Eliquis (apixaban): http://www.eliquis.com/eliquis/home

## 2022-07-09 NOTE — Progress Notes (Signed)
CT surgery PM rounds  Patient stable sitting up in bed Walked in room today I/O balance net -400 cc Remains on epinephrine, norepinephrine for preoperative low EF, cardiomyopathy  P.m. labs satisfactory with hemoglobin 8.9, creatinine 1.0, potassium 4.8  Continue to slowly wean norepinephrine blood pressure permits ,hold Lasix this p.m. and check Co. ox with a.m. labs  Blood pressure (!) 85/65, pulse 92, temperature 98.1 F (36.7 C), temperature source Oral, resp. rate 19, height '5\' 7"'$  (1.702 m), weight 78.5 kg, SpO2 97 %.

## 2022-07-09 NOTE — Progress Notes (Signed)
[address]       [place] [id number]             [phone]      1 Day Post-Op  Procedure(s) (LRB): BENTALL PROCEDURE USING RESILIA KONECT 29MM CONDUIT (N/A) CORONARY ARTERY BYPASS GRAFTING (CABG) X 1, USING ENDOSCOPICALLY HARVESTED RIGHT LEG GREATER SAPHENOUS VEIN (N/A) TRANSESOPHAGEAL ECHOCARDIOGRAM (TEE) (N/A) EPICARDIAL PACING LEAD PLACEMENT (N/A)   Total Length of Stay:  LOS: 1 day    SUBJECTIVE: Has done well overnight.  Was AAI paced with great CO/CI Hungry this am No other complaints Vitals:   07/09/22 0600 07/09/22 0700  BP: (!) 93/59 (!) 100/58  Pulse: 89 78  Resp: (!) 25 (!) 28  Temp: 98.6 F (37 C) 98.8 F (37.1 C)  SpO2: 94%     Intake/Output      09/27 0701 09/28 0700 09/28 0701 09/29 0700   P.O. 450    I.V. (mL/kg) 4856.8 (61.9)    Blood 870    IV Piggyback 2042.8    Total Intake(mL/kg) 8219.5 (104.7)    Urine (mL/kg/hr) 3485 (1.8)    Blood 1411    Chest Tube 1660    Total Output 6556    Net +1663.5             sodium chloride 20 mL/hr at 07/09/22 0700   sodium chloride     sodium chloride 10 mL/hr at 07/09/22 0700   albumin human 999 mL/hr at 07/08/22 2200    ceFAZolin (ANCEF) IV Stopped (07/09/22 0533)   clevidipine     dexmedetomidine (PRECEDEX) IV infusion Stopped (07/08/22 1827)   epinephrine 4 mcg/min (07/09/22 0700)   insulin Stopped (07/09/22 0133)   lactated ringers     lactated ringers 10 mL/hr at 07/09/22 0700   norepinephrine (LEVOPHED) Adult infusion 8 mcg/min (07/09/22 0700)   phenylephrine (NEO-SYNEPHRINE) Adult infusion     vasopressin 0.03 Units/min (07/09/22 0700)    CBC    Component Value Date/Time   WBC 12.3 (H) 07/09/2022 0302   RBC 2.51 (L) 07/09/2022 0302   HGB 9.3 (L) 07/09/2022 0302   HGB 14.8 09/30/2018 1524   HCT 26.3 (L) 07/09/2022 0302   HCT 42.2 09/30/2018 1524   PLT 102 (L) 07/09/2022 0302   PLT 231 09/30/2018 1524   MCV 104.8 (H) 07/09/2022 0302   MCV 101 (H)  09/30/2018 1524   MCH 37.1 (H) 07/09/2022 0302   MCHC 35.4 07/09/2022 0302   RDW 12.7 07/09/2022 0302   RDW 12.8 09/30/2018 1524   LYMPHSABS 1.9 06/25/2022 1829   MONOABS 0.5 06/25/2022 1829   EOSABS 0.1 06/25/2022 1829   BASOSABS 0.1 06/25/2022 1829   CMP     Component Value Date/Time   NA 135 07/09/2022 0302   NA 141 10/11/2019 1521   K 5.4 (H) 07/09/2022 0302   CL 105 07/09/2022 0302   CO2 20 (L) 07/09/2022 0302   GLUCOSE 110 (H) 07/09/2022 0302   BUN 12 07/09/2022 0302   BUN 13 10/11/2019 1521   CREATININE 0.84 07/09/2022 0302   CALCIUM 8.1 (L) 07/09/2022 0302   PROT 6.5 07/07/2022 0900   ALBUMIN 3.9 07/07/2022 0900   AST 23 07/07/2022 0900   ALT 23 07/07/2022 0900   ALKPHOS 67 07/07/2022 0900   BILITOT 1.2 07/07/2022 0900   GFRNONAA >60 07/09/2022 0302   GFRAA 108 10/11/2019 1521   ABG    Component Value Date/Time   PHART 7.353 07/08/2022 1921  PCO2ART 35.7 07/08/2022 1921   PO2ART 94 07/08/2022 1921   HCO3 19.9 (L) 07/08/2022 1921   TCO2 21 (L) 07/08/2022 1921   ACIDBASEDEF 5.0 (H) 07/08/2022 1921   O2SAT 97 07/08/2022 1921   CBG (last 3)  Recent Labs    07/08/22 2257 07/09/22 0056 07/09/22 0300  GLUCAP 111* 110* 111*   EXAM Awake and alert Lungs: clear but decreased at bases Cardiac: RR with soft systolic murmur Ext; warm and dry   ASSESSMENT: SP Aortic root replacement and CABG with epicardial lead placement POD # 1 Will wean epi then levophed Have put in own rhythm. Cant tell if junctional or sinus but holding pressures for now. Watch but if needs better pressure can go back to AAI Keep cts for now Remove swan, keep side arm Lasix this am Hold bb for now Hopefully start coumadin tomorrow   Coralie Common, MD '@DATE'$ @

## 2022-07-09 NOTE — Discharge Summary (Addendum)
[address]       [place] [address]             [phone]    Physician Discharge Summary  Patient ID: Joe [name] MRN: [id number] DOB/AGE: 1956-05-29 66 y.o.  Admit date: 07/08/2022 Discharge date: 08/13/2022  Admission Diagnoses:  Patient Active Problem List   Diagnosis Date Noted   On mechanically assisted ventilation (HCC)    Chronic combined systolic and diastolic heart failure (HCC)    Aortic aneurysm (HCC) 06/25/2022   PVC (premature ventricular contraction) 05/26/2022   Lower extremity edema 05/26/2022   LBBB (left bundle branch block) 05/26/2022   Murmur 05/26/2022   Primary localized osteoarthrosis of left shoulder 09/21/2017   Primary localized osteoarthrosis of shoulder 09/21/2017   Osteoarthritis of right hip 05/15/2014   Hip arthritis 05/15/2014   Hyponatremia 01/18/2014   Osteoarthritis of left hip 01/16/2014   Abdominal distension 12/14/2012   Urinary retention 12/12/2012   Abnormal LFTs 12/12/2012   Mental status change 12/11/2012   Left leg pain 08/27/2011   Discharge Diagnoses:  Patient Active Problem List   Diagnosis Date Noted   Pressure injury of skin 07/24/2022   Accelerated junctional rhythm    Acute respiratory failure with hypoxia (HCC)    Drug-induced constipation    Hemorrhoids    Shock (HCC)    S/P ascending aortic replacement 07/08/2022   S/P CABG x 1    On mechanically assisted ventilation (HCC)    Chronic combined systolic and diastolic heart failure (HCC)    Aortic aneurysm (HCC) 06/25/2022   PVC (premature ventricular contraction) 05/26/2022   Lower extremity edema 05/26/2022   LBBB (left bundle branch block) 05/26/2022   Murmur 05/26/2022   Primary localized osteoarthrosis of left shoulder 09/21/2017   Primary localized osteoarthrosis of shoulder 09/21/2017   Osteoarthritis of right hip 05/15/2014   Hip arthritis 05/15/2014   Hyponatremia 01/18/2014   Osteoarthritis of left hip 01/16/2014    Abdominal distension 12/14/2012   Urinary retention 12/12/2012   Abnormal LFTs 12/12/2012   Mental status change 12/11/2012   Left leg pain 08/27/2011   Discharged Condition: good  History of Present Illness:     I have been asked to see this 66 yo wm with a new diagnosis of an aortic root aneurysm and severe AI. Pt reportedly from mother was seen months ago by new NP and heard carotid sound that along with EKG was asked to see cardiology.Pt was evaluated with holter monitor and was asked to start lasix for lower extremity edema which he took PRN. Pt has been very active with bike riding over 5 miles a day. Mother report he has been felling well with no CP or significant DOE just the lower extremity edema. He was seen by Cardiology and an echo was performed with severe AI, severe dilation of the LV, and a 6 cm aortic root aneurysm. Pt was asked to report for futher inpt work up with coronary CTA and now with concern for LAD and RCA stenosis. Pt has remained asymptomatic since admission.  Dr. Leafy Ro reviewed the patient's chart, labs and diagnostic studies and determined that surgical aortic root replacement and surgical revascularization surgery would provide the best long term treatment. He reviewed Joe [name]'s treatment options as well as the risks and benefits of surgery with Joe [name] and his mother. After careful consideration Mr [name] and his mother agreed to proceed with surgery.    Hospital Course: Joe [name]  presented to [facility] and was brought to the operating room on 07/08/22. He underwent a Bentall procedure with bioprosthetic aortic valve replacement using  a 29mm Resilia Konect conduit as well as a CABG x 1 utilizing RSVG to RCA. He tolerated the procedure well and ws transferred to the SICU in stable condition. He was extubated the evening of surgery. Chest tube output was 1660 and 690 over 24 hours.  He was weaned off Epi and Levophed as hemodynamics allowed.   His chest tube were removed without complication. He required AAI pacing post operatively.  He was started on lasix for volume overloaded state. Theone Murdoch catheter was removed without complication. Advanced heart failure team was consulted and recommended guideline directed medical therapy for heart failure once pressors were weaned off as well as eventual cardiac resynchronization therapy. Foley catheter was removed routinely.  Pressors have been weaned off by postop day 4.  He is on amiodarone for junctional rhythm.  He developed Atrial Fibrillation with controlled rates.  EP consult was obtained due to low EF and rhythm issues they felt the patient would benefit from CRT-D placement.  The patient was aggressively diuresed by AHF team.  He developed atrial flutter vs atrial tach. He underwent attempted rapid atrial pacing with no success. He was started on IV amiodarone and low dose heparin. Reglan was given for continued constipation and abdominal pain. CXR continued to show gaseous distension within the bowel, KUB showed gaseous distension of the small and large bowel likely due to postoperative ileus. He was made NPO with sips and chips for bowel rest. His hypotension worsened so norepinephrine was restarted temporarily. His anemia worsened so 1U of RBCs were transfused. Urine output drastically decreased so bladder scan was ordered, greater than 1L of retained urine was noted. Foley catheter was inserted without complication and he was started on Flomax for urinary retention. Echocardiogram showed EF <20%. Norepinephrine was weaned as tolerated and midodrine was restarted. Electrophysiology was consulted and recommended CRT-D implantation in the outpatient setting if he requires DC-CV for postop afib/aflutter due to the need for uninterrupted NOACs after DC-CV.  By postop day 10, he was still in atrial fibrillation with controlled ventricular response.  He was tolerating this reasonably well.  EP team plan for  eventual DC cardioversion prior to discharge.  Hypokalemia was managed with both IV and oral supplements.  Foley catheter was removed on 07/20/2022.  Echocardiogram was ordered to assess HF off pressors. Updated echocardiogram showed continued LVEF <20%. Epicardial pacing wires were removed without complication. Heparin was discontinued and Eliquis was started. He became hyponatremic so his lasix was discontinued. He remained in atrial fibrillation so he underwent TEE/DC-CV on 10/12 which was successfully converted him to sinus rhythm. He began passing gas with postoperative ileus but had not had a bowel movement in 8 days. Neostigmine was started and he was given multiple suppositories.  PT was consulted, they recommended home health PT which was appropriately arranged. He had a bowel movement, loose stools. Regular diet was started. He remained fluid overloaded and hyponatremia improved so diuresis was restarted. He was started on demadex for fluid overload. After cardioversion he remained in sinus bradycardia so he was not placed on a beta blocker. Midodrine was continued for soft blood pressure. His bladder became distended and he became hypotensive. A foley catheter was replaced and removed . Urology was consulted and recommended discharging with foley and flomax and follow up as an outpatient. He converted into Afib with  RVR. He was transferred back to the ICU. He was started on amiodarone drip and norepinephrine for hypotension. His RUE became edematous, a vascular ultrasound showed right sided acute nonocclusive DVT surrounding the PICC line in the subclavian vein. PICC line was removed and replaced on left side. Urinalysis showed large leukocytes and positive growth for rare bacteria, he was started on IV ceftriaxone. He was volume overload and showing signs of worsening CHF, he was diuresed aggressively. He became hypokalemic so potassium was supplemented accordingly.  Bowel issues persisted and his  bowel regimen was adjusted.  He was stable for transfer to the progressive care unit on 07/30/22. Volume overload state resolved, diuretic was held. Hyponatremia resolved. The patient continued to have a sore bottom.  He was found to have a stage 2 pressure sore at his gluteal cleft.  Wound care consultation was obtained and made recommendations for care. He continues to progress slowly in his physical activity level and disposition is not completely certain as we are not sure that he will have enough help at home to provide for current level of needs. TOC was consulted and recommended CIR. He began to have chest pain, diaphoresis and nausea. Limited echo was obtained and showed LVEF 20-25%, troponins were obtained and were mildly elevated at 36 and 35. CIR evaluated the patient but insurance ultimately denied him. New PT/OT consults recommended home health PT/OT. Sacral wound dressings were not changed x 2 days, wound care was consulted again A full thickness unstageable gluteal cleft wound was noted. Wound care made dressing changes were as well as new recommendations for care. He was referred to wound care clinic at discharge. All other wounds are healing well. AHF started Losartan and this was titrated to 50 on 10/27. He was also started on Spirinolactone 12.5 mg daily. He experienced dizziness with ambulation, orthostatic blood pressures were stable. Dizziness improved over the weekend. Abdominal pain increased, he had not had a bowel movement in 4 days. KUB showed mild to moderate stool burden.  He was treated with an enema which resulted in a bowel movement and improvement in abdominal pain. AHF discontinued Losartan and restarted home Entresto, this was titrated as able. He is felt medically stable for discharge. Home health arrangements were ordered and made. Mother arranged private care services for 08/13/22.   Consults:  EP, AHF  Significant Diagnostic Studies: cardiac graphics:  CT Scan Cardiac  Morphology  IMPRESSION: Motion and noise artifact, as well as reduced contrast density degrade the diagnostic quality of this exam.   1. Ascending aortic aneurysm with severe dilation at the sinus of Valsalva, 67 mm. Moderate dilation of mid ascending aorta, 44 mm.   2. Probable severe CAD in mid LAD and distal RCA, 70-99% stenosis, CADRADS 4. FFR could not be fully processed due to image quality.   3. Coronary calcium score is 693, which places the patient in the 82nd percentile for age and sex matched control.   4. Normal coronary origins with right dominance.   5. Moderate-severe dilation of main pulmonary artery, 39 mm, suggests pulmonary hypertension. Alternatively in setting of dilated aorta, may indicate vascular pathology.   RECOMMENDATIONS: CAD-RADS 4 Severe stenosis. (70-99% or > 50% left main). Cardiac catheterization or CT FFR is recommended. Consider symptom-guided anti-ischemic pharmacotherapy as well as risk factor modification per guideline directed care.   Electronically Signed: By: Weston Brass M.D. On: 06/26/2022 16:06     Treatments: surgery:   07/08/2022 Patient:  Joe [name] Pre-Op Dx:  Aortic Root aneurysm with severe AI                     Cardiomyopathy                     Coronary artery disease   Post-op Dx:  Same Procedure: Aortic Root replacement with reimplantation of coronary buttons with a 29 mm KONECT Resilia valve conduit (Size 29mm, model 11060A, SN [id number]) CABG X 1 with RSVG from aortic graft to mid RCA   Endoscopic greater saphenous vein harvest on the right Epicardial pacemaker leads x 2 (REF [id number]) ( REF [id number])     Surgeon and Role:      Eugenio Hoes, MD- Primary    * Aloha Gell , PA-C - assisting An experienced assistant was required given the complexity of this surgery and the standard of surgical care. The assistant was needed for exposure, dissection, suctioning, retraction of delicate  tissues and sutures, instrument exchange and for overall help during this procedure.     Discharge Exam: Blood pressure 120/86, pulse 65, temperature 97.7 F (36.5 C), temperature source Oral, resp. rate 17, height 5\' 7"  (1.702 m), weight 70.9 kg, SpO2 97 %. General appearance: alert, cooperative, and no distress Neurologic: intact Heart: regular rate and rhythm, S1, S2 normal, no murmur, click, rub or gallop Lungs: clear to auscultation bilaterally Abdomen: soft, non-tender; bowel sounds normal; no masses,  no organomegaly Extremities: extremities normal, atraumatic, no cyanosis or edema Wound: Full thickness sacral wound, sternal wound clean, dry and intact   Discharge Medications:  The patient has been discharged on:   1.Beta Blocker:  Yes [   ]                              No   [ x  ]                              If No, reason: junctional rhythm, bradycardia  2.Ace Inhibitor/ARB: Yes [  x ]                                     No  [    ]                                     If No, reason:  3.Statin:   Yes [ X  ]                  No  [   ]                  If No, reason:  4.Ecasa:  Yes  [ X  ]                  No   [   ]                  If No, reason:  Patient had ACS upon admission: No  Plavix/P2Y12 inhibitor: Yes [   ]  No  [  x ]     Discharge Instructions     Amb Referral to Cardiac Rehabilitation   Complete by: As directed    Diagnosis:  CABG Other     CABG X ___: 1   After initial evaluation and assessments completed: Virtual Based Care may be provided alone or in conjunction with Phase 2 Cardiac Rehab based on patient barriers.: Yes   Intensive Cardiac Rehabilitation (ICR) [facility] location only OR Traditional Cardiac Rehabilitation (TCR) *If criteria for ICR are not met will enroll in TCR [facility] only): Yes      Allergies as of 08/13/2022       Reactions   Codeine Nausea Only   Levaquin [levofloxacin In D5w] Diarrhea         Medication List     STOP taking these medications    Entresto 24-26 MG Generic drug: sacubitril-valsartan Replaced by: sacubitril-valsartan 49-51 MG   Jardiance 10 MG Tabs tablet Generic drug: empagliflozin   OVER THE COUNTER MEDICATION   UNABLE TO FIND   UNABLE TO FIND       TAKE these medications    acetaminophen 325 MG tablet Commonly known as: TYLENOL Take 650 mg by mouth every 6 (six) hours as needed for mild pain.   amiodarone 200 MG tablet Commonly known as: PACERONE Take 1 tablet (200 mg total) by mouth daily.   apixaban 5 MG Tabs tablet Commonly known as: ELIQUIS Take 1 tablet (5 mg total) by mouth 2 (two) times daily.   aspirin EC 81 MG tablet Take 1 tablet (81 mg total) by mouth daily. Swallow whole.   atorvastatin 80 MG tablet Commonly known as: LIPITOR Take 1 tablet (80 mg total) by mouth daily. What changed:  medication strength how much to take   Co Q-10 100 MG Caps Take 100 mg by mouth daily.   fluticasone 50 MCG/ACT nasal spray Commonly known as: FLONASE Place 2 sprays into both nostrils daily. What changed:  when to take this reasons to take this   furosemide 20 MG tablet Commonly known as: LASIX Take 2 tablets (40 mg total) by mouth as needed for edema (Take 2 tabs as needed for weight gain of 3lbs in 24 hours or 5 lbs in 48 hours). What changed:  how much to take reasons to take this   leptospermum manuka honey Pste paste Apply 1 Application topically daily.   polyethylene glycol 17 g packet Commonly known as: MIRALAX / GLYCOLAX Take 17 g by mouth daily.   potassium chloride SA 20 MEQ tablet Commonly known as: KLOR-CON M Take 1 tablet (20 mEq total) by mouth as needed (Take one tab on the days you require lasix).   sacubitril-valsartan 49-51 MG Commonly known as: ENTRESTO Take 1 tablet by mouth 2 (two) times daily. Replaces: Entresto 24-26 MG   spironolactone 25 MG tablet Commonly known as: ALDACTONE Take  0.5 tablets (12.5 mg total) by mouth daily.   tamsulosin 0.4 MG Caps capsule Commonly known as: FLOMAX Take 0.4 mg by mouth 2 (two) times daily.   traMADol 50 MG tablet Commonly known as: ULTRAM Take 1 tablet (50 mg total) by mouth every 6 (six) hours as needed for moderate pain.   Vitamin D3 250 MCG (10000 UT) capsule Take 10,000 Units by mouth daily.        Follow-up Information     [facility] Follow up on 08/19/2022.   Specialty: Cardiothoracic Surgery Why: Appointment at 3:00PM Contact  information: [address] [place], [address] [place] [address] [phone]        Alver Sorrow, NP Follow up on 08/20/2022.   Specialty: Cardiology Why: Cardiology follow up at 3:35PM. Contact information: [address] [employer] [place] [address] [phone]         [facility] Follow up on 08/19/2022.   Why: To get PA/LAT CXR at 2:00PM Contact information: [address] [place] [address]        [facility] Follow up.   Why: Home Health Physical Therapy-agency will call to arrange appointment Contact information: [phone]        [facility] Follow up on 09/02/2022.   Specialty: Cardiology Why: Follow up in the Advanced Heart Failure Clinic at [facility] 09/02/22 at 0930 am Entrance C, free Valet Please bring all medications with you Contact information: [address] [id number] mc [place] [address] [phone]        [facility] Follow up on 08/25/2022.   Why: Urology follow up appointment at 10:00am Contact information: [address] [place] [address] [place] [address] [phone]        [facility]             . Go on 08/27/2022.   Why: Wound follow up appointment- Time- 8:00am-  if you need to change appointment please  call clinic within 24 hour of scheduled appointment Contact information: [address] [place] [address] [phone]                Signed:  Jenny Reichmann, PA-C  08/13/2022, 12:54 PM

## 2022-07-10 ENCOUNTER — Other Ambulatory Visit: Payer: Self-pay | Admitting: Cardiology

## 2022-07-10 ENCOUNTER — Inpatient Hospital Stay (HOSPITAL_COMMUNITY): Payer: Medicare Other

## 2022-07-10 DIAGNOSIS — I429 Cardiomyopathy, unspecified: Secondary | ICD-10-CM | POA: Diagnosis not present

## 2022-07-10 DIAGNOSIS — Z95828 Presence of other vascular implants and grafts: Secondary | ICD-10-CM

## 2022-07-10 DIAGNOSIS — I5022 Chronic systolic (congestive) heart failure: Secondary | ICD-10-CM | POA: Diagnosis not present

## 2022-07-10 DIAGNOSIS — I447 Left bundle-branch block, unspecified: Secondary | ICD-10-CM | POA: Diagnosis not present

## 2022-07-10 DIAGNOSIS — Z951 Presence of aortocoronary bypass graft: Secondary | ICD-10-CM | POA: Diagnosis not present

## 2022-07-10 DIAGNOSIS — I351 Nonrheumatic aortic (valve) insufficiency: Secondary | ICD-10-CM | POA: Diagnosis not present

## 2022-07-10 LAB — GLUCOSE, CAPILLARY
Glucose-Capillary: 134 mg/dL — ABNORMAL HIGH (ref 70–99)
Glucose-Capillary: 108 mg/dL — ABNORMAL HIGH (ref 70–99)
Glucose-Capillary: 132 mg/dL — ABNORMAL HIGH (ref 70–99)
Glucose-Capillary: 136 mg/dL — ABNORMAL HIGH (ref 70–99)
Glucose-Capillary: 117 mg/dL — ABNORMAL HIGH (ref 70–99)
Glucose-Capillary: 134 mg/dL — ABNORMAL HIGH (ref 70–99)

## 2022-07-10 LAB — POCT I-STAT 7, (LYTES, BLD GAS, ICA,H+H)
Acid-base deficit: 2 mmol/L (ref 0.0–2.0)
Bicarbonate: 23.4 mmol/L (ref 20.0–28.0)
Calcium, Ion: 1.22 mmol/L (ref 1.15–1.40)
HCT: 25 % — ABNORMAL LOW (ref 39.0–52.0)
Hemoglobin: 8.5 g/dL — ABNORMAL LOW (ref 13.0–17.0)
O2 Saturation: 93 %
Patient temperature: 37.1
Potassium: 4.6 mmol/L (ref 3.5–5.1)
Sodium: 129 mmol/L — ABNORMAL LOW (ref 135–145)
TCO2: 25 mmol/L (ref 22–32)
pCO2 arterial: 43.3 mmHg (ref 32–48)
pH, Arterial: 7.342 — ABNORMAL LOW (ref 7.35–7.45)
pO2, Arterial: 71 mmHg — ABNORMAL LOW (ref 83–108)

## 2022-07-10 LAB — COOXEMETRY PANEL
Carboxyhemoglobin: 2.1 % — ABNORMAL HIGH (ref 0.5–1.5)
Methemoglobin: <0.7 % (ref 0.0–1.5)
O2 Saturation: 75.2 %
Total hemoglobin: 8.9 g/dL — ABNORMAL LOW (ref 12.0–16.0)

## 2022-07-10 LAB — CBC
HCT: 23.9 % — ABNORMAL LOW (ref 39.0–52.0)
Hemoglobin: 8.5 g/dL — ABNORMAL LOW (ref 13.0–17.0)
MCH: 37.1 pg — ABNORMAL HIGH (ref 26.0–34.0)
MCHC: 35.6 g/dL (ref 30.0–36.0)
MCV: 104.4 fL — ABNORMAL HIGH (ref 80.0–100.0)
Platelets: 93 10*3/uL — ABNORMAL LOW (ref 150–400)
RBC: 2.29 MIL/uL — ABNORMAL LOW (ref 4.22–5.81)
RDW: 13.1 % (ref 11.5–15.5)
WBC: 13.9 10*3/uL — ABNORMAL HIGH (ref 4.0–10.5)
nRBC: 0 % (ref 0.0–0.2)

## 2022-07-10 LAB — BASIC METABOLIC PANEL
Anion gap: 5 (ref 5–15)
BUN: 17 mg/dL (ref 8–23)
CO2: 26 mmol/L (ref 22–32)
Calcium: 8.2 mg/dL — ABNORMAL LOW (ref 8.9–10.3)
Chloride: 100 mmol/L (ref 98–111)
Creatinine, Ser: 0.87 mg/dL (ref 0.61–1.24)
GFR, Estimated: >60 mL/min (ref >60)
Glucose, Bld: 106 mg/dL — ABNORMAL HIGH (ref 70–99)
Potassium: 4.7 mmol/L (ref 3.5–5.1)
Sodium: 131 mmol/L — ABNORMAL LOW (ref 135–145)

## 2022-07-10 LAB — SURGICAL PATHOLOGY

## 2022-07-10 MED ORDER — FUROSEMIDE 10 MG/ML IJ SOLN
20.0000 mg | Freq: Once | INTRAMUSCULAR | Status: AC
  Administered 2022-07-10: 20 mg via INTRAVENOUS
  Filled 2022-07-10: qty 2

## 2022-07-10 MED ORDER — POLYETHYLENE GLYCOL 3350 17 G PO PACK
17.0000 g | PACK | Freq: Every day | ORAL | Status: DC
  Administered 2022-07-10 – 2022-07-14 (×2): 17 g via ORAL
  Filled 2022-07-10 (×2): qty 1

## 2022-07-10 MED ORDER — HYDROCORTISONE (PERIANAL) 2.5 % EX CREA
TOPICAL_CREAM | Freq: Two times a day (BID) | CUTANEOUS | Status: DC | PRN
  Administered 2022-07-16: 1 via RECTAL
  Filled 2022-07-10: qty 28.35

## 2022-07-10 MED ORDER — ATORVASTATIN CALCIUM 80 MG PO TABS
80.0000 mg | ORAL_TABLET | Freq: Every day | ORAL | Status: DC
  Administered 2022-07-10 – 2022-08-13 (×35): 80 mg via ORAL
  Filled 2022-07-10 (×35): qty 1

## 2022-07-10 MED FILL — Sodium Chloride IV Soln 0.9%: INTRAVENOUS | Qty: 2000 | Status: AC

## 2022-07-10 MED FILL — Lidocaine HCl Local Soln Prefilled Syringe 100 MG/5ML (2%): INTRAMUSCULAR | Qty: 5 | Status: AC

## 2022-07-10 MED FILL — Sodium Bicarbonate IV Soln 8.4%: INTRAVENOUS | Qty: 50 | Status: AC

## 2022-07-10 MED FILL — Heparin Sodium (Porcine) Inj 1000 Unit/ML: INTRAMUSCULAR | Qty: 20 | Status: AC

## 2022-07-10 MED FILL — Electrolyte-R (PH 7.4) Solution: INTRAVENOUS | Qty: 5000 | Status: AC

## 2022-07-10 MED FILL — Lidocaine HCl Local Preservative Free (PF) Inj 2%: INTRAMUSCULAR | Qty: 14 | Status: AC

## 2022-07-10 MED FILL — Mannitol IV Soln 20%: INTRAVENOUS | Qty: 500 | Status: AC

## 2022-07-10 MED FILL — Heparin Sodium (Porcine) Inj 1000 Unit/ML: Qty: 1000 | Status: AC

## 2022-07-10 MED FILL — Potassium Chloride Inj 2 mEq/ML: INTRAVENOUS | Qty: 40 | Status: AC

## 2022-07-10 NOTE — Progress Notes (Addendum)
Advanced Heart Failure Rounding Note  PCP-Cardiologist: Skeet Latch, MD   Subjective:    Yesterday given IV lasix with good response. Pressors weaned.   Epi 1 + Norepi 2 mcg.    Complaining of buttock pain. Denies SOB.    Objective:   Weight Range: 77.8 kg Body mass index is 26.86 kg/m.   Vital Signs:   Temp:  [98.1 F (36.7 C)-99.7 F (37.6 C)] 98.8 F (37.1 C) (09/29 0400) Pulse Rate:  [73-104] 79 (09/29 0700) Resp:  [14-33] 16 (09/29 0700) BP: (85-109)/(48-75) 98/69 (09/29 0500) SpO2:  [90 %-100 %] 96 % (09/29 0700) Arterial Line BP: (78-138)/(38-72) 96/57 (09/29 0700) Weight:  [77.8 kg] 77.8 kg (09/29 0500) Last BM Date :  (PTA)  Weight change: Filed Weights   07/08/22 0606 07/09/22 0330 07/10/22 0500  Weight: 71.2 kg 78.5 kg 77.8 kg    Intake/Output:   Intake/Output Summary (Last 24 hours) at 07/10/2022 0830 Last data filed at 07/10/2022 0700 Gross per 24 hour  Intake 2029.52 ml  Output 3640 ml  Net -1610.48 ml      Physical Exam    General:  Sitting in the chair. No resp difficulty HEENT: Normal Neck: Supple. JVP 9-10  Carotids 2+ bilat; no bruits. No lymphadenopathy or thyromegaly appreciated. RIJ Cor: PMI nondisplaced. Regular rate & rhythm. No rubs, gallops or murmurs. Sternal wound Lungs: Decreased LLL on 4 liters [place]. CT  Abdomen: Soft, nontender, nondistended. No hepatosplenomegaly. No bruits or masses. Good bowel sounds. Extremities: No cyanosis, clubbing, rash, edema Neuro: Alert & orientedx3, cranial nerves grossly intact. moves all 4 extremities w/o difficulty. Affect pleasant   Telemetry   V paced 80s   EKG    N/A  Labs    CBC Recent Labs    07/09/22 1542 07/10/22 0419 07/10/22 0546  WBC 14.3* 13.9*  --   HGB 8.9* 8.5* 8.5*  HCT 26.3* 23.9* 25.0*  MCV 105.6* 104.4*  --   PLT 107* 93*  --    Basic Metabolic Panel Recent Labs    07/09/22 0302 07/09/22 1542 07/10/22 0419 07/10/22 0546  NA 135 131* 131*  129*  K 5.4* 4.8 4.7 4.6  CL 105 99 100  --   CO2 20* 21* 26  --   GLUCOSE 110* 145* 106*  --   BUN '12 16 17  '$ --   CREATININE 0.84 1.01 0.87  --   CALCIUM 8.1* 8.5* 8.2*  --   MG 2.4 2.1  --   --    Liver Function Tests Recent Labs    07/07/22 0900  AST 23  ALT 23  ALKPHOS 67  BILITOT 1.2  PROT 6.5  ALBUMIN 3.9   No results for input(s): "LIPASE", "AMYLASE" in the last 72 hours. Cardiac Enzymes No results for input(s): "CKTOTAL", "CKMB", "CKMBINDEX", "TROPONINI" in the last 72 hours.  BNP: BNP (last 3 results) Recent Labs    06/25/22 1829  BNP 436.2*    ProBNP (last 3 results) No results for input(s): "PROBNP" in the last 8760 hours.   D-Dimer No results for input(s): "DDIMER" in the last 72 hours. Hemoglobin A1C No results for input(s): "HGBA1C" in the last 72 hours. Fasting Lipid Panel No results for input(s): "CHOL", "HDL", "LDLCALC", "TRIG", "CHOLHDL", "LDLDIRECT" in the last 72 hours. Thyroid Function Tests No results for input(s): "TSH", "T4TOTAL", "T3FREE", "THYROIDAB" in the last 72 hours.  Invalid input(s): "FREET3"  Other results:   Imaging    No results found.  Medications:     Scheduled Medications:  acetaminophen  1,000 mg Oral Q6H   Or   acetaminophen (TYLENOL) oral liquid 160 mg/5 mL  1,000 mg Per Tube Q6H   aspirin EC  81 mg Oral Daily   atorvastatin  40 mg Oral Daily   bisacodyl  10 mg Oral Daily   Or   bisacodyl  10 mg Rectal Daily   Chlorhexidine Gluconate Cloth  6 each Topical Daily   docusate sodium  200 mg Oral Daily   insulin aspart  0-24 Units Subcutaneous Q4H   pantoprazole  40 mg Oral Daily   sodium chloride flush  10-40 mL Intracatheter Q12H   sodium chloride flush  3 mL Intravenous Q12H   tamsulosin  0.4 mg Oral BID    Infusions:  sodium chloride Stopped (07/09/22 1410)   sodium chloride     sodium chloride 10 mL/hr at 07/10/22 0700   dexmedetomidine (PRECEDEX) IV infusion Stopped (07/08/22 1827)    epinephrine 4 mcg/min (07/10/22 0700)   insulin Stopped (07/09/22 0133)   lactated ringers     lactated ringers 10 mL/hr at 07/10/22 0700   norepinephrine (LEVOPHED) Adult infusion 2 mcg/min (07/10/22 0700)   vasopressin 0.03 Units/min (07/09/22 2300)    PRN Medications: sodium chloride, dextrose, midazolam, morphine injection, ondansetron (ZOFRAN) IV, oxyCODONE, sodium chloride flush, sodium chloride flush, traMADol    Patient Profile   66 y/o male recently found to have severe LV dysfunction in setting of markedly dilated aortic root (67 mm) and severe AI (tricuspid AoV) and single vessel CAD, now s/p Bentall and CABG x 1 w/ SVG-RCA. AHF team consulted to assist w/ post-operative management of systolic heart failure.     Assessment/Plan   Ascending Aortic Aneurysm/ Severe AI  - CTA  w/ severe dilation at the sinus of Valsalva, 67 mm. Moderate dilation of mid ascending aorta, 44 mm. Tricuspid AoV - s/p Bentall 9/27 - intraoperative TEE, no AI, no perivalvular leak post repair    2. CAD - single vessel, 60-70% pRCA  - s/p CABG x 1, SVR- RCA  - ASA + statin  - hold initiation of ? blocker for now until stable off pressors/inotropes    3. Chronic Systolic Heart Failure  -  newly diagnosed; Echo 9/23 EF 25-30%, LV severely dilated, severe AI, Global HK, RV ok  - NICM, severity out of proportion to degree of CAD  - likely valvular CM in setting of severe AI +/- LBBB CM  - now post op AVR + CABG. Intraoperative TEE LVEF 20-25%, RV ok - POD2  coming off NE and EPi. CO-OX stable 75%.  - Set up CVP.  Weight up 15 pounds from preop.  - wean inotropes/pressors as tolerated  - Continue IV lasix 20 mg and follow with response.  - GDMT once off  pressors  - will need f/u echo in ~3 months post AVR and after optimization of meds. If EF remains < 35%, will need CRT-D    4. Hyperkalemia - Stable 4.7   5. LBBB - may need eventual CRT-D if EF does not improve   Ambulate.    Length  of Stay: 2  Darrick Grinder, NP  07/10/2022, 8:30 AM  Advanced Heart Failure Team Pager [phone] (M-F; 7a - 5p)  Please contact [facility] for night-coverage after hours (5p -7a ) and weekends on [email or web address]  Agree with above.   Feels bad today. Belly and butt sore. Remains on NE and Epi. Denies CP. No  BM yet. CTs minimal drainage.   A-paced at 49. Junctional rhythm at 60 underneath. Scheduled for IV lasix today  General:  Sitting in chair No resp difficulty HEENT: normal Neck: supple. JVP 10. Carotids 2+ bilat; no bruits. No lymphadenopathy or thryomegaly appreciated. Cor: sternal dressing ok  +CTs Regular rate & rhythm. No rubs, gallops or murmurs. Lungs: decreased at bases Abdomen: soft, nontender, + distended. No hepatosplenomegaly. No bruits or masses. Hypoactive bowel sounds. Extremities: no cyanosis, clubbing, rash, 1+ edema Neuro: alert & orientedx3, cranial nerves grossly intact. moves all 4 extremities w/o difficulty. Affect pleasant  Continue to wean inotropes as tolerated. Currently in junctional rhythm ~60. I turned down a-pacing and let native rhythm come through and SBP dropped 10 points so I resumed a-pacing  Agree with IV lasix for diuresis. Mobilize and adjust bowel regimen.   Will need CRT via epicardial lead hook-up prior to d/c.   CRITICAL CARE Performed by: Glori Bickers  Total critical care time: 35 minutes  Critical care time was exclusive of separately billable procedures and treating other patients.  Critical care was necessary to treat or prevent imminent or life-threatening deterioration.  Critical care was time spent personally by me (independent of midlevel providers or residents) on the following activities: development of treatment plan with patient and/or surrogate as well as nursing, discussions with consultants, evaluation of patient's response to treatment, examination of patient, obtaining history from patient or surrogate, ordering and  performing treatments and interventions, ordering and review of laboratory studies, ordering and review of radiographic studies, pulse oximetry and re-evaluation of patient's condition.  Glori Bickers, MD  6:26 PM

## 2022-07-10 NOTE — Progress Notes (Signed)
      [address]       [place] [address]             [phone]      POD # 2 Bentall  Resting comfortably  BP 91/75 (BP Location: Left Arm)   Pulse 79   Temp 98.4 F (36.9 C) (Bladder)   Resp 16   Ht '5\' 7"'$  (1.702 m)   Wt 77.8 kg   SpO2 98%   BMI 26.86 kg/m   Intake/Output Summary (Last 24 hours) at 07/10/2022 1645 Last data filed at 07/10/2022 1000 Gross per 24 hour  Intake 1274.4 ml  Output 2455 ml  Net -1180.6 ml   About 20 ml of UP since 7 AM  Continue current care  Orvin Netter C. Roxan Hockey, MD [facility] [phone]

## 2022-07-10 NOTE — Progress Notes (Signed)
[address]       [place] [address]             [phone]      2 Days Post-Op  Procedure(s) (LRB): BENTALL PROCEDURE USING RESILIA KONECT 29MM CONDUIT (N/A) CORONARY ARTERY BYPASS GRAFTING (CABG) X 1, USING ENDOSCOPICALLY HARVESTED RIGHT LEG GREATER SAPHENOUS VEIN (N/A) TRANSESOPHAGEAL ECHOCARDIOGRAM (TEE) (N/A) EPICARDIAL PACING LEAD PLACEMENT (N/A)   Total Length of Stay:  LOS: 2 days    SUBJECTIVE: Appears more tired this am Denies pain or SOB Eating breakfast this morning   Vitals:   07/10/22 0600 07/10/22 0700  BP:    Pulse: 80 79  Resp:  16  Temp:    SpO2:  96%    Intake/Output      09/28 0701 09/29 0700 09/29 0701 09/30 0700   P.O. 200    I.V. (mL/kg) 1620.5 (20.8)    Blood     IV Piggyback 300.1    Total Intake(mL/kg) 2120.6 (27.3)    Urine (mL/kg/hr) 3045 (1.6)    Blood     Chest Tube 690    Total Output 3735    Net -1614.4             sodium chloride Stopped (07/09/22 1410)   sodium chloride     sodium chloride 10 mL/hr at 07/10/22 0700   dexmedetomidine (PRECEDEX) IV infusion Stopped (07/08/22 1827)   epinephrine 4 mcg/min (07/10/22 0700)   insulin Stopped (07/09/22 0133)   lactated ringers     lactated ringers 10 mL/hr at 07/10/22 0700   norepinephrine (LEVOPHED) Adult infusion 2 mcg/min (07/10/22 0700)   vasopressin 0.03 Units/min (07/09/22 2300)    CBC    Component Value Date/Time   WBC 13.9 (H) 07/10/2022 0419   RBC 2.29 (L) 07/10/2022 0419   HGB 8.5 (L) 07/10/2022 0546   HGB 14.8 09/30/2018 1524   HCT 25.0 (L) 07/10/2022 0546   HCT 42.2 09/30/2018 1524   PLT 93 (L) 07/10/2022 0419   PLT 231 09/30/2018 1524   MCV 104.4 (H) 07/10/2022 0419   MCV 101 (H) 09/30/2018 1524   MCH 37.1 (H) 07/10/2022 0419   MCHC 35.6 07/10/2022 0419   RDW 13.1 07/10/2022 0419   RDW 12.8 09/30/2018 1524   LYMPHSABS 1.9 06/25/2022 1829   MONOABS 0.5 06/25/2022 1829   EOSABS 0.1 06/25/2022 1829   BASOSABS 0.1  06/25/2022 1829   CMP     Component Value Date/Time   NA 129 (L) 07/10/2022 0546   NA 141 10/11/2019 1521   K 4.6 07/10/2022 0546   CL 100 07/10/2022 0419   CO2 26 07/10/2022 0419   GLUCOSE 106 (H) 07/10/2022 0419   BUN 17 07/10/2022 0419   BUN 13 10/11/2019 1521   CREATININE 0.87 07/10/2022 0419   CALCIUM 8.2 (L) 07/10/2022 0419   PROT 6.5 07/07/2022 0900   ALBUMIN 3.9 07/07/2022 0900   AST 23 07/07/2022 0900   ALT 23 07/07/2022 0900   ALKPHOS 67 07/07/2022 0900   BILITOT 1.2 07/07/2022 0900   GFRNONAA >60 07/10/2022 0419   GFRAA 108 10/11/2019 1521   ABG    Component Value Date/Time   PHART 7.342 (L) 07/10/2022 0546   PCO2ART 43.3 07/10/2022 0546   PO2ART 71 (L) 07/10/2022 0546   HCO3 23.4 07/10/2022 0546   TCO2 25 07/10/2022 0546   ACIDBASEDEF 2.0 07/10/2022 0546   O2SAT 93 07/10/2022 0546   CBG (last 3)  Recent Labs  07/09/22 1604 07/09/22 2356 07/10/22 0418  GLUCAP 143* 126* 108*   EXAM Lungs: depressed at bases Card: RR Ext: warm   ASSESSMENT: Overall stable CoOX 75 this am Still requiring atrial pacing for rate and epi and levo Will attempt to wean again today Lasix low dose today Leave CT for output yesterday Will use ASA only for valve Remove foley later today if hemodynamics and uo remain ok   Coralie Common, MD '@DATE'$ @

## 2022-07-10 NOTE — Consult Note (Signed)
Cardiology Consultation   Patient ID: LATHAM KINZLER MRN: [id number]; DOB: [birth year]  Admit date: 07/08/2022 Date of Consult: 07/10/2022  PCP:  Willey Blade, MD   [facility] Cardiologist:  Skeet Latch, MD        Patient Profile:   RYKIN ROUTE is a 66 y.o. male with a hx of CAD, VHD (severe AI), LBBB, chronic CHF (systolic), dilated AO root/ascending Ao who is being seen 07/10/2022 for the timing of ICD implant at the request of Dr. Lavonna Monarch.  History of Present Illness:   Mr. Maland was initially seen by Dr. Sophronia Simas service at his last hospitalization, he was planned to be discharged and return electively for CABG, aortic root replacement/AVR, and epicardial LV lead placement to be followed up with EP for CRT-D implant.  He underwent on 07/08/22 Aortic Root replacement with reimplantation of coronary buttons with a 29 mm KONECT Resilia valve conduit (Size 64m, model 11060A, SN [id number] 2.   CABG X 1 with RSVG from aortic graft to mid RCA   3.   Endoscopic greater saphenous vein harvest on the right 4.   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   Murmur 05/26/2022   Osteoarthritis of left hip 01/16/2014   Osteoarthritis of right hip 05/15/2014   Primary localized osteoarthrosis of left shoulder 09/21/2017   PVC (premature ventricular contraction) 05/26/2022   Right ureteral stone    Speech impediment    Thoracic ascending aortic aneurysm  ([place]    Umbilical hernia     Past Surgical History:  Procedure Laterality Date   BACK SURGERY     CYSTOSCOPY W/ URETERAL STENT REMOVAL Right 04/19/2013   Procedure: CYSTOSCOPY WITH STENT REMOVAL;  Surgeon: DMolli Hazard MD;  [facility]  Service: Urology;  Laterality: Right;   CYSTOSCOPY WITH RETROGRADE PYELOGRAM, URETEROSCOPY AND STENT PLACEMENT Right 04/19/2013   Procedure: CYSTOSCOPY WITH RETROGRADE PYELOGRAM, URETEROSCOPY ;  Surgeon: DMolli Hazard MD;  [facility]  Service: Urology;  Laterality: Right;   CYSTOSCOPY WITH STENT PLACEMENT Right 03/24/2013   Procedure: CYSTOSCOPY WITH STENT PLACEMENT;  Surgeon: MHanley Ben MD;  [facility];  Service: Urology;  Laterality: Right;   HOLMIUM LASER APPLICATION Right 78/05/2799  Procedure: HOLMIUM LASER APPLICATION;  Surgeon: DMolli Hazard MD;  [facility]  Service: Urology;  Laterality: Right;   INGUINAL HERNIA REPAIR Left 03-04-2005   POSTERIOR LUMBAR FUSION  08-27-2011   L4 -- L5   RIGHT HEART CATH AND CORONARY ANGIOGRAPHY N/A 06/29/2022   Procedure: RIGHT HEART CATH AND CORONARY ANGIOGRAPHY;  Surgeon: CSherren Mocha MD;  [facility];  Service: Cardiovascular;  Laterality: N/A;   RIGHT SHOULDER HEMIARTHROPLASTY  08-26-2010   OA   SHOULDER HEMI-ARTHROPLASTY Left 09/21/2017   Procedure: SHOULDER HEMI-ARTHROPLASTY;  Surgeon: LMarchia Bond MD;  Location: [place];  Service: Orthopedics;  Laterality: Left;   TONSILLECTOMY  AS CHILD   TOTAL HIP ARTHROPLASTY Left 01/16/2014   Procedure: TOTAL HIP ARTHROPLASTY;  Surgeon: Johnny Bridge, MD;  Location: [place];  Service: Orthopedics;  Laterality: Left;   TOTAL HIP ARTHROPLASTY Right 05/15/2014   Procedure: RIGHT TOTAL HIP ARTHROPLASTY;  Surgeon: Johnny Bridge, MD;  Location: [place];  Service: Orthopedics;  Laterality: Right;   TOTAL SHOULDER ARTHROPLASTY Right 2011     Home  Medications:  Prior to Admission medications   Medication Sig Start Date End Date Taking? Authorizing Provider  acetaminophen (TYLENOL) 325 MG tablet Take 650 mg by mouth every 6 (six) hours as needed for mild pain.   Yes [provider]  aspirin EC 81 MG tablet Take 1 tablet (81 mg total) by mouth daily. Swallow whole. 07/01/22  Yes Earnie Larsson, NP  atorvastatin (LIPITOR) 40 MG tablet Take 1 tablet (40 mg total) by mouth daily. 07/01/22  Yes Earnie Larsson, NP  Cholecalciferol (VITAMIN D3) 250 MCG (10000 UT) capsule Take 10,000 Units by mouth daily.   Yes [provider]  Coenzyme Q10 (CO Q-10) 100 MG CAPS Take 100 mg by mouth daily.   Yes [provider]  empagliflozin (JARDIANCE) 10 MG TABS tablet Take 1 tablet (10 mg total) by mouth daily. 07/01/22  Yes Earnie Larsson, NP  fluticasone (FLONASE) 50 MCG/ACT nasal spray Place 2 sprays into both nostrils daily. Patient taking differently: Place 2 sprays into both nostrils daily as needed for allergies. 01/09/20  Yes Minette Brine, FNP  furosemide (LASIX) 20 MG tablet Take 1 tablet (20 mg total) by mouth as needed for edema. 05/26/22 08/24/22 Yes Skeet Latch, MD  OVER THE COUNTER MEDICATION Take 2 capsules by mouth daily. EPA & DHA   Yes [provider]  sacubitril-valsartan (ENTRESTO) 24-26 MG Take 1 tablet by mouth 2 (two) times daily. 06/30/22  Yes Earnie Larsson, NP  spironolactone (ALDACTONE) 25 MG tablet Take 0.5 tablets (12.5 mg total) by mouth daily. 07/01/22  Yes Earnie Larsson, NP  tamsulosin (FLOMAX) 0.4 MG CAPS capsule Take 0.4 mg by mouth 2 (two) times daily. 04/11/22  Yes [provider]  UNABLE TO FIND Take 1 tablet by mouth daily. Med Name: Neuro optimizer   Yes [provider]  UNABLE TO FIND Take 1-2 tablets by mouth See admin instructions. Med Name: Neuro Zac: Take 2 tablets by mouth in morning and 1 tablet by mouth  in the afternoon   Yes [provider]    Inpatient  Medications: Scheduled Meds:  acetaminophen  1,000 mg Oral Q6H   Or   acetaminophen (TYLENOL) oral liquid 160 mg/5 mL  1,000 mg Per Tube Q6H   aspirin EC  81 mg Oral Daily   atorvastatin  80 mg Oral Daily   bisacodyl  10 mg Oral Daily   Or   bisacodyl  10 mg Rectal Daily   Chlorhexidine Gluconate Cloth  6 each Topical Daily   docusate sodium  200 mg Oral Daily   insulin aspart  0-24 Units Subcutaneous Q4H   pantoprazole  40 mg Oral Daily   polyethylene glycol  17 g Oral Daily   sodium chloride flush  10-40 mL Intracatheter Q12H   sodium chloride flush  3 mL Intravenous Q12H   tamsulosin  0.4 mg Oral BID   Continuous Infusions:  sodium chloride Stopped (07/09/22 1410)   sodium chloride  sodium chloride 10 mL/hr at 07/10/22 0800   epinephrine 4 mcg/min (07/10/22 0800)   insulin Stopped (07/09/22 0133)   lactated ringers     lactated ringers 10 mL/hr at 07/10/22 0800   norepinephrine (LEVOPHED) Adult infusion 2 mcg/min (07/10/22 0800)   vasopressin 0.03 Units/min (07/09/22 2300)   PRN Meds: sodium chloride, dextrose, hydrocortisone, morphine injection, ondansetron (ZOFRAN) IV, oxyCODONE, sodium chloride flush, sodium chloride flush, traMADol  Allergies:    Allergies  Allergen Reactions   Codeine Nausea Only   Levaquin [Levofloxacin In D5w] Diarrhea    Social History:   Social History   Socioeconomic History   Marital status: Single    Spouse name: Not on file   Number of children: Not on file   Years of education: Not on file   Highest education level: Not on file  Occupational History   Occupation: unemployed  Tobacco Use   Smoking status: Never   Smokeless tobacco: Never  Vaping Use   Vaping Use: Never used  Substance and Sexual Activity   Alcohol use: No   Drug use: No   Sexual activity: Not Currently  Other Topics Concern   Not on file  Social History Narrative   Pt with delayed mental capabilities, 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  Lives with parents.   Social Determinants of Health   Financial Resource Strain: Low Risk  (10/11/2019)   Overall Financial Resource Strain (CARDIA)    Difficulty of Paying Living Expenses: Not hard at all  Food Insecurity: No Food Insecurity (06/29/2022)   Hunger Vital Sign    Worried About Running Out of Food in the Last Year: Never true    Ran Out of Food in the Last Year: Never true  Transportation Needs: No Transportation Needs (06/29/2022)   PRAPARE - Hydrologist (Medical): No    Lack of Transportation (Non-Medical): No  Physical Activity: Sufficiently Active (10/11/2019)   Exercise Vital Sign    Days of Exercise per Week: 5 days    Minutes of Exercise per Session: 40 min  Stress: Stress Concern Present (10/11/2019)   [place]    Feeling of Stress : To some extent  Social Connections: Not on file  Intimate Partner Violence: Not At Risk (06/29/2022)   Humiliation, Afraid, Rape, and Kick questionnaire    Fear of Current or Ex-Partner: No    Emotionally Abused: No    Physically Abused: No    Sexually Abused: No    Family History:   Family History  Problem Relation Age of Onset   Heart failure Mother    Atrial fibrillation Mother    Heart failure Maternal Grandmother    Atrial fibrillation Maternal Grandmother      ROS:  Please see the history of present illness.  All other ROS reviewed and negative.     Physical Exam/Data:   Vitals:   07/10/22 0500 07/10/22 0600 07/10/22 0700 07/10/22 0800  BP: 98/69   91/75  Pulse: 88 80 79 79  Resp:   16 16  Temp:    99 F (37.2 C)  TempSrc:    Bladder  SpO2: 98%  96% 97%  Weight: 77.8 kg     Height:        Intake/Output Summary (Last 24 hours) at 07/10/2022 1142 Last data filed  at 07/10/2022 0800 Gross per 24 hour  Intake 1790.3 ml  Output  3165 ml  Net -1374.7 ml      07/10/2022    5:00 AM 07/09/2022    3:30 AM 07/08/2022    6:06 AM  Last 3 Weights  Weight (lbs) 171 lb 8.3 oz 173 lb 1 oz 157 lb  Weight (kg) 77.8 kg 78.5 kg 71.215 kg     Body mass index is 26.86 kg/m.  General:  Well nourished, well developed, in no acute distress HEENT: normal Neck: no JVD Vascular: No carotid bruits; Distal pulses 2+ bilaterally Cardiac: RRR; no murmurs, gallops or rubs Sternal wound is clean Lungs:  diminished at the bases, no wheezing, rhonchi or rales  Abd: soft, nontender  Ext: no edema Musculoskeletal:  No deformities Skin: warm and dry  Neuro:  no gross focal abnormalities noted Psych:  Normal affect   EKG:  The EKG was personally reviewed and demonstrates:    SR 73bpm, 1st degree AVblock 234m, LBBB 1653m PVC  Telemetry:  Telemetry was personally reviewed and demonstrates:   AP/VS  Relevant CV Studies:  Coronary CTA 06/26/22: Ascending aorta measures 67 mm at sinus of Valsava, ascending aorta measures 44 mm, calcium score 693 with concern for 70-99% stenosis in m LAD and d RCA, moderate to severe dilatation main pulmonary artery measuring 39 mm.    Echo 06/25/2022 showed LVEF 25-30%, severe LV dilation, Moderate LVH, Grade 1 DD, normal RV, severe LAE, Severe AI.    LHC 06/29/2022 showed 60-70% RCA lesion, otherwise normal coronaries with only mild luminal irregularities.    Laboratory Data:  High Sensitivity Troponin:  No results for input(s): "TROPONINIHS" in the last 720 hours.   Chemistry Recent Labs  Lab 07/08/22 1919 07/08/22 1921 07/09/22 0302 07/09/22 1542 07/10/22 0419 07/10/22 0546  NA 139   < > 135 131* 131* 129*  K 4.8   < > 5.4* 4.8 4.7 4.6  CL 111  --  105 99 100  --   CO2 22  --  20* 21* 26  --   GLUCOSE 117*  --  110* 145* 106*  --   BUN 11  --  '12 16 17  '$ --   CREATININE 0.94  --  0.84 1.01 0.87  --   CALCIUM 7.6*  --  8.1* 8.5* 8.2*  --   MG 3.0*  --  2.4 2.1  --   --   GFRNONAA >60   --  >60 >60 >60  --   ANIONGAP 6  --  '10 11 5  '$ --    < > = values in this interval not displayed.    Recent Labs  Lab 07/07/22 0900  PROT 6.5  ALBUMIN 3.9  AST 23  ALT 23  ALKPHOS 67  BILITOT 1.2   Lipids No results for input(s): "CHOL", "TRIG", "HDL", "LABVLDL", "LDLCALC", "CHOLHDL" in the last 168 hours.  Hematology Recent Labs  Lab 07/09/22 0302 07/09/22 1542 07/10/22 0419 07/10/22 0546  WBC 12.3* 14.3* 13.9*  --   RBC 2.51* 2.49* 2.29*  --   HGB 9.3* 8.9* 8.5* 8.5*  HCT 26.3* 26.3* 23.9* 25.0*  MCV 104.8* 105.6* 104.4*  --   MCH 37.1* 35.7* 37.1*  --   MCHC 35.4 33.8 35.6  --   RDW 12.7 13.0 13.1  --   PLT 102* 107* 93*  --    Thyroid No results for input(s): "TSH", "FREET4" in the last 168 hours.  BNPNo results  for input(s): "BNP", "PROBNP" in the last 168 hours.  DDimer No results for input(s): "DDIMER" in the last 168 hours.   Radiology/Studies:  ECHO INTRAOPERATIVE TEE Result Date: 07/08/2022  *INTRAOPERATIVE TRANSESOPHAGEAL REPORT *  Patient Name:   Jarry A Danzy Date of Exam: 07/08/2022 Medical Rec #:  [id number]       Height:       67.0 in Accession #:    [id number]      Weight:       157.0 lb Date of Birth:  October 18, 1955       BSA:          1.82 m Patient Age:    79 years        BP:           89/52 mmHg Patient Gender: M               HR:           68 bpm. Exam Location:  Inpatient Transesophogeal exam was perform intraoperatively during surgical procedure. Patient was closely monitored under general anesthesia during the entirety of examination. Indications:     aortic root replacement Sonographer:     Johny Chess RDCS Performing Phys: Renold Don MD Diagnosing Phys: Renold Don MD Complications: No known complications during this procedure. POST-OP IMPRESSIONS _ Left Ventricle: The left ventricle is unchanged from pre-bypass. _ Right Ventricle: The right ventricle appears unchanged from pre-bypass. _ Aorta: A graft was placed in the ascending aorta for  repair. _ Left Atrium: The left atrium appears unchanged from pre-bypass. _ Left Atrial Appendage: The left atrial appendage appears unchanged from pre-bypass. _ Aortic Valve: A bioprosthetic bioprosthetic valve was placed, leaflets are freely mobile and leaflets thin. No regurgitation post repair. No perivalvular leak noted. _ Mitral Valve: The mitral valve appears unchanged from pre-bypass. _ Tricuspid Valve: The tricuspid valve appears unchanged from pre-bypass. _ Pulmonic Valve: The pulmonic valve appears unchanged from pre-bypass. _ Interatrial Septum: The interatrial septum appears unchanged from pre-bypass. _ Interventricular Septum: The interventricular septum appears unchanged from pre-bypass. _ Pericardium: The pericardium appears unchanged from pre-bypass. PRE-OP FINDINGS  Left Ventricle: The left ventricle has severely reduced systolic function, with an ejection fraction of 20-30%. The cavity size was severely dilated. Left ventrical global hypokinesis without regional wall motion abnormalities. There is mild concentric left ventricular hypertrophy. Right Ventricle: The right ventricle has normal systolic function. The cavity was normal. There is no increase in right ventricular wall thickness. Left Atrium: Left atrial size was dilated. No left atrial/left atrial appendage thrombus was detected. Right Atrium: Right atrial size was dilated. Interatrial Septum: No atrial level shunt detected by color flow Doppler. Pericardium: There is no evidence of pericardial effusion. Mitral Valve: The mitral valve is normal in structure. Mitral valve regurgitation is mild by color flow Doppler. There is No evidence of mitral stenosis. Tricuspid Valve: The tricuspid valve was normal in structure. Tricuspid valve regurgitation is trivial by color flow Doppler. Aortic Valve: The aortic valve is tricuspid Aortic valve regurgitation is severe by color flow Doppler. The jet is eccentric. There is no stenosis of the aortic  valve. Pulmonic Valve: The pulmonic valve was not assessed. Pulmonic valve regurgitation was not assessed by color flow Doppler. Aorta: There is severe dilatation of the aortic root.  Renold Don MD Electronically signed by Renold Don MD Signature Date/Time: 07/08/2022/1:17:18 PM    Final       Assessment and Plan:   Fairly new diagnosis of  NICM CHF (systolic) LBBB CAD Dilated Aortic root/ascending Ao AI  POD #2 Aortic Root replacement with reimplantation of coronary buttons with a 29 mm KONECT Resilia valve conduit (Size 27m, model 11060A, SN [id number] 2.   CABG X 1 with RSVG from aortic graft to mid RCA   3.   Endoscopic greater saphenous vein harvest on the right 4.   Epicardial pacemaker leads x 2 (REF [id number] ( REF [id number]  We will follow from [place] look to implant just prior to discharge  Risk Assessment/Risk Scores:    For questions or updates, please contact CLockhartPlease consult [email or web address] for contact info under    Signed, RBaldwin Jamaica PA-C  07/10/2022 11:42 AM

## 2022-07-10 NOTE — Progress Notes (Signed)
NAME:  TABIAS SWAYZE, MRN:  [id number], DOB:  [birth year], LOS: 2 ADMISSION DATE:  07/08/2022, CONSULTATION DATE:  07/08/22 REFERRING MD:  Lavonna Monarch - CVTS, CHIEF COMPLAINT:  s/p  Aortic root replacement, CABG x1   History of Present Illness:  66 yo M PMH CAD, aortic root aneurysm with severe aortic insufficiency, cardiomyopathy presented to [facility] hospital 07/08/22 for elective CABG x1 and aortic root replacement.  Cross clamp time 111 min Total pump time 153 minutes  EBL 1478m  Received 870 ml cellsaver and about 3.5 L crystalloid/colloid  Arrives to the ICU intubated post op.   Pertinent  Medical History  CAD Aortic root aneurysm Aortic Insufficiency Cardiomyopathy   Significant Hospital Events: Including procedures, antibiotic start and stop dates in addition to other pertinent events   Elective CABG x1 and aortic root replacement. ICU admission after case  9/28 now extubated, low dose pressors   Interim History / Subjective:  Remains on NE & epi this morning.  His main complaint today is having severe discomfort from hemorrhoids. Remains on low dose NE 2, Epi 1.  Objective   Blood pressure 91/75, pulse 79, temperature 99 F (37.2 C), temperature source Bladder, resp. rate 16, height '5\' 7"'$  (1.702 m), weight 77.8 kg, SpO2 97 %.        Intake/Output Summary (Last 24 hours) at 07/10/2022 0935 Last data filed at 07/10/2022 0800 Gross per 24 hour  Intake 1972.33 ml  Output 3765 ml  Net -1792.67 ml    Filed Weights   07/08/22 0606 07/09/22 0330 07/10/22 0500  Weight: 71.2 kg 78.5 kg 77.8 kg    General:  elderly man sitting up in the recliner in NAD HEENT: [place]/AT, eyes anicteric Neuro: Awake and alert, answering questions appropriately.  Moving all extremities. CV: S1-S2, paced rhythm PULM: Breathing comfortably on nasal cannula, CTA B.  Decreased basilar breath sounds. GI: Soft, nontender, nondistended Extremities: Mild pedal edema, no cyanosis Skin: Warm, dry, no diffuse  rashes  [place] 75% Na+ 131 BUN 17 Cr 0.87 WBC 13.9 H/H 8.5/23.9 Platelets 93 CXR personally reviewed>  widened mediastinum, RIJ CVC, temp pacing wire. Increased interstitial markings.   Resolved Hospital Problem list     Assessment & Plan:  CAD s/p CABG x1 Aortic root aneurysm with severe AI, s/p aortic root replacement  Chronic HFrEF LBBB Shock -- cardiogenic shock +/- post-cabg vasoplegia  -Con't pacing due to poor perfusion with background rhythm; defer to Cardiology is PPM should be considered. May need resynchronization therapy if ejection fraction does not recover postoperatively -post-op care per TCTS -con't inotropic support  -hold Bblocker until off inotropes; may influence PPM decision as well -ASA, statin -tele monitoring -post-op pain control- morphine, tramadol, oxycodone -con't foley for strict I/Os  Expected post op ABLA, consumptive thrombocytopenia -call Dr. WLavonna Monarchbefore transfusing -monitor  Leukocytosis, likely reactive -monitor  Hyponatremia -may need diuresis  Patient's mother updated at bedside.  Best Practice (right click and "Reselect all SmartList Selections" daily)   Diet/type: Regular consistency (see orders) DVT prophylaxis: SCD GI prophylaxis: H2B Lines: Central line, Arterial Line, and yes and it is still needed Foley:  Yes, and it is still needed Code Status:  full code Last date of multidisciplinary goals of care discussion [per primary]  Labs   CBC: Recent Labs  Lab 07/08/22 1350 07/08/22 1742 07/08/22 1919 07/08/22 1921 07/09/22 0302 07/09/22 1542 07/10/22 0419 07/10/22 0546  WBC 16.5*  --  12.6*  --  12.3* 14.3* 13.9*  --  HGB 11.4*   < > 9.3* 8.5* 9.3* 8.9* 8.5* 8.5*  HCT 32.5*   < > 26.1* 25.0* 26.3* 26.3* 23.9* 25.0*  MCV 103.2*  --  102.8*  --  104.8* 105.6* 104.4*  --   PLT 134*  --  100*  --  102* 107* 93*  --    < > = values in this interval not displayed.     Basic Metabolic Panel: Recent Labs  Lab  07/07/22 0900 07/08/22 0810 07/08/22 1156 07/08/22 1348 07/08/22 1919 07/08/22 1921 07/09/22 0302 07/09/22 1542 07/10/22 0419 07/10/22 0546  NA 137   < > 137   < > 139 139 135 131* 131* 129*  K 4.5   < > 4.6   < > 4.8 4.8 5.4* 4.8 4.7 4.6  CL 109   < > 103  --  111  --  105 99 100  --   CO2 20*  --   --   --  22  --  20* 21* 26  --   GLUCOSE 108*   < > 151*  --  117*  --  110* 145* 106*  --   BUN 11   < > 12  --  11  --  '12 16 17  '$ --   CREATININE 0.88   < > 0.60*  --  0.94  --  0.84 1.01 0.87  --   CALCIUM 9.4  --   --   --  7.6*  --  8.1* 8.5* 8.2*  --   MG  --   --   --   --  3.0*  --  2.4 2.1  --   --    < > = values in this interval not displayed.    GFR: Estimated Creatinine Clearance: 76 mL/min (by C-G formula based on SCr of 0.87 mg/dL). Recent Labs  Lab 07/08/22 1919 07/09/22 0302 07/09/22 1542 07/10/22 0419  WBC 12.6* 12.3* 14.3* 13.9*     This patient is critically ill with multiple organ system failure which requires frequent high complexity decision making, assessment, support, evaluation, and titration of therapies. This was completed through the application of advanced monitoring technologies and extensive interpretation of multiple databases. During this encounter critical care time was devoted to patient care services described in this note for 36 minutes.  Julian Hy, DO 07/10/22 4:24 PM [facility]

## 2022-07-11 DIAGNOSIS — K5900 Constipation, unspecified: Secondary | ICD-10-CM

## 2022-07-11 DIAGNOSIS — Z951 Presence of aortocoronary bypass graft: Secondary | ICD-10-CM | POA: Diagnosis not present

## 2022-07-11 DIAGNOSIS — R570 Cardiogenic shock: Secondary | ICD-10-CM | POA: Diagnosis not present

## 2022-07-11 DIAGNOSIS — A419 Sepsis, unspecified organism: Secondary | ICD-10-CM

## 2022-07-11 DIAGNOSIS — Z95828 Presence of other vascular implants and grafts: Secondary | ICD-10-CM | POA: Diagnosis not present

## 2022-07-11 DIAGNOSIS — R6521 Severe sepsis with septic shock: Secondary | ICD-10-CM

## 2022-07-11 LAB — CBC
HCT: 22.2 % — ABNORMAL LOW (ref 39.0–52.0)
Hemoglobin: 7.8 g/dL — ABNORMAL LOW (ref 13.0–17.0)
MCH: 36.6 pg — ABNORMAL HIGH (ref 26.0–34.0)
MCHC: 35.1 g/dL (ref 30.0–36.0)
MCV: 104.2 fL — ABNORMAL HIGH (ref 80.0–100.0)
Platelets: 91 10*3/uL — ABNORMAL LOW (ref 150–400)
RBC: 2.13 MIL/uL — ABNORMAL LOW (ref 4.22–5.81)
RDW: 12.5 % (ref 11.5–15.5)
WBC: 10.9 10*3/uL — ABNORMAL HIGH (ref 4.0–10.5)
nRBC: 0 % (ref 0.0–0.2)

## 2022-07-11 LAB — COOXEMETRY PANEL
Carboxyhemoglobin: 1.8 % — ABNORMAL HIGH (ref 0.5–1.5)
Methemoglobin: <0.7 % (ref 0.0–1.5)
O2 Saturation: 60.4 %
Total hemoglobin: 9.4 g/dL — ABNORMAL LOW (ref 12.0–16.0)

## 2022-07-11 LAB — GLUCOSE, CAPILLARY
Glucose-Capillary: 110 mg/dL — ABNORMAL HIGH (ref 70–99)
Glucose-Capillary: 116 mg/dL — ABNORMAL HIGH (ref 70–99)
Glucose-Capillary: 127 mg/dL — ABNORMAL HIGH (ref 70–99)
Glucose-Capillary: 133 mg/dL — ABNORMAL HIGH (ref 70–99)
Glucose-Capillary: 138 mg/dL — ABNORMAL HIGH (ref 70–99)
Glucose-Capillary: 152 mg/dL — ABNORMAL HIGH (ref 70–99)
Glucose-Capillary: 91 mg/dL (ref 70–99)

## 2022-07-11 LAB — BASIC METABOLIC PANEL
Anion gap: 7 (ref 5–15)
BUN: 18 mg/dL (ref 8–23)
CO2: 24 mmol/L (ref 22–32)
Calcium: 8.1 mg/dL — ABNORMAL LOW (ref 8.9–10.3)
Chloride: 98 mmol/L (ref 98–111)
Creatinine, Ser: 0.75 mg/dL (ref 0.61–1.24)
GFR, Estimated: >60 mL/min (ref >60)
Glucose, Bld: 102 mg/dL — ABNORMAL HIGH (ref 70–99)
Potassium: 4.6 mmol/L (ref 3.5–5.1)
Sodium: 129 mmol/L — ABNORMAL LOW (ref 135–145)

## 2022-07-11 MED ORDER — SORBITOL 70 % SOLN
15.0000 mL | Freq: Four times a day (QID) | Status: AC
  Administered 2022-07-11: 15 mL via ORAL
  Filled 2022-07-11: qty 30

## 2022-07-11 MED ORDER — AMIODARONE LOAD VIA INFUSION
150.0000 mg | Freq: Once | INTRAVENOUS | Status: AC
  Administered 2022-07-11: 150 mg via INTRAVENOUS
  Filled 2022-07-11: qty 83.34

## 2022-07-11 MED ORDER — AMIODARONE HCL IN DEXTROSE 360-4.14 MG/200ML-% IV SOLN
30.0000 mg/h | INTRAVENOUS | Status: DC
  Administered 2022-07-11 – 2022-07-23 (×23): 30 mg/h via INTRAVENOUS
  Filled 2022-07-11 (×15): qty 200
  Filled 2022-07-11: qty 400
  Filled 2022-07-11 (×8): qty 200

## 2022-07-11 MED ORDER — AMIODARONE HCL IN DEXTROSE 360-4.14 MG/200ML-% IV SOLN: 30.0000 mg/h | INTRAVENOUS | Status: DC

## 2022-07-11 MED ORDER — INSULIN ASPART 100 UNIT/ML IJ SOLN
0.0000 [IU] | Freq: Three times a day (TID) | INTRAMUSCULAR | Status: DC
  Administered 2022-07-11 – 2022-07-12 (×3): 1 [IU] via SUBCUTANEOUS

## 2022-07-11 MED ORDER — AMIODARONE HCL IN DEXTROSE 360-4.14 MG/200ML-% IV SOLN
60.0000 mg/h | INTRAVENOUS | Status: DC
  Administered 2022-07-11: 60 mg/h via INTRAVENOUS
  Filled 2022-07-11: qty 200

## 2022-07-11 MED ORDER — FUROSEMIDE 10 MG/ML IJ SOLN
40.0000 mg | Freq: Once | INTRAMUSCULAR | Status: AC
  Administered 2022-07-11: 40 mg via INTRAVENOUS
  Filled 2022-07-11: qty 4

## 2022-07-11 MED ORDER — HYDROCORTISONE ACETATE 25 MG RE SUPP: 25.0000 mg | Freq: Two times a day (BID) | RECTAL | Status: DC | PRN

## 2022-07-11 MED ORDER — DOBUTAMINE IN D5W 4-5 MG/ML-% IV SOLN
2.0000 ug/kg/min | INTRAVENOUS | Status: DC
  Administered 2022-07-11: 2 ug/kg/min via INTRAVENOUS
  Filled 2022-07-11: qty 250

## 2022-07-11 NOTE — Progress Notes (Signed)
Pt transported on Bipap form 2C 09 to 3D91 with no complications.

## 2022-07-11 NOTE — Progress Notes (Signed)
      [address]       [place] [address]             [phone]      POD # 3 [place]  Events of earlier today noted.  Hypotensive after went into A fib with RVR Off dobutamine and back down to 3 of norepi  BP 96/62   Pulse 78   Temp 98.7 F (37.1 C) (Oral)   Resp 15   Ht '5\' 7"'$  (1.702 m)   Wt 78.6 kg   SpO2 100%   BMI 27.14 kg/m   Intake/Output Summary (Last 24 hours) at 07/11/2022 1816 Last data filed at 07/11/2022 1700 Gross per 24 hour  Intake 939.13 ml  Output 2240 ml  Net -1300.87 ml   Feels better this evening  Continue current Rx  Pearse Shiffler C. Roxan Hockey, MD [facility] [phone]

## 2022-07-11 NOTE — Progress Notes (Signed)
3 Days Post-Op Procedure(s) (LRB): BENTALL PROCEDURE USING RESILIA KONECT 29MM CONDUIT (N/A) CORONARY ARTERY BYPASS GRAFTING (CABG) X 1, USING ENDOSCOPICALLY HARVESTED RIGHT LEG GREATER SAPHENOUS VEIN (N/A) TRANSESOPHAGEAL ECHOCARDIOGRAM (TEE) (N/A) EPICARDIAL PACING LEAD PLACEMENT (N/A) Subjective: Up in chair, just finished breakfast, denies pain  Objective: Vital signs in last 24 hours: Temp:  [98 F (36.7 C)-98.4 F (36.9 C)] 98.2 F (36.8 C) (09/30 0300) Pulse Rate:  [55-90] 88 (09/30 0800) Cardiac Rhythm: Atrial paced (09/29 2000) Resp:  [9-19] 19 (09/30 0800) BP: (82-129)/(37-82) 87/37 (09/30 0800) SpO2:  [89 %-98 %] 89 % (09/30 0800) Arterial Line BP: (94-159)/(47-84) 98/47 (09/30 0800) Weight:  [78.6 kg] 78.6 kg (09/30 0500)  Hemodynamic parameters for last 24 hours: CVP:  [5 mmHg-6 mmHg] 6 mmHg  Intake/Output from previous day: 09/29 0701 - 09/30 0700 In: 618.8 [I.V.:618.8] Out: 1980 [Urine:1910; Chest Tube:70] Intake/Output this shift: Total I/O In: 43.8 [I.V.:43.8] Out: 60 [Urine:60]  General appearance: alert, cooperative, and no distress Neurologic: intact Heart: regular rate and rhythm and paced Lungs: diminished breath sounds bibasilar Abdomen: normal findings: soft, non-tender  Lab Results: Recent Labs    07/10/22 0419 07/10/22 0546 07/11/22 0510  WBC 13.9*  --  10.9*  HGB 8.5* 8.5* 7.8*  HCT 23.9* 25.0* 22.2*  PLT 93*  --  91*   BMET:  Recent Labs    07/10/22 0419 07/10/22 0546 07/11/22 0510  NA 131* 129* 129*  K 4.7 4.6 4.6  CL 100  --  98  CO2 26  --  24  GLUCOSE 106*  --  102*  BUN 17  --  18  CREATININE 0.87  --  0.75  CALCIUM 8.2*  --  8.1*    PT/INR:  Recent Labs    07/08/22 1350  LABPROT 20.1*  INR 1.7*   ABG    Component Value Date/Time   PHART 7.342 (L) 07/10/2022 0546   HCO3 23.4 07/10/2022 0546   TCO2 25 07/10/2022 0546   ACIDBASEDEF 2.0 07/10/2022 0546   O2SAT 93 07/10/2022 0546   CBG (last 3)  Recent  Labs    07/10/22 1956 07/11/22 0004 07/11/22 0331  GLUCAP 134* 110* 91    Assessment/Plan: S/P Procedure(s) (LRB): BENTALL PROCEDURE USING RESILIA KONECT 29MM CONDUIT (N/A) CORONARY ARTERY BYPASS GRAFTING (CABG) X 1, USING ENDOSCOPICALLY HARVESTED RIGHT LEG GREATER SAPHENOUS VEIN (N/A) TRANSESOPHAGEAL ECHOCARDIOGRAM (TEE) (N/A) EPICARDIAL PACING LEAD PLACEMENT (N/A) POD # 3 NEURO- intact CV- paced at 90  On ASA  Bp still low- on norepi @ 5  Add digoxin per AHF RESP- IS for atelectasis RENAL- diuresed well yesterday but will hold off this AM with BP low ENDO- CBG well controlled  Change SSI to [facility] and HS GI- tolerating diet Anemia secondary to ABL- stable, monitor Thrombocytopenia- stable, monitor SCD + mobilization for DVT prophylaxis   LOS: 3 days    Joe [name] 07/11/2022

## 2022-07-11 NOTE — Progress Notes (Signed)
NAME:  Joe [name], MRN:  [id number], DOB:  01-Sep-1956, LOS: 3 ADMISSION DATE:  07/08/2022, CONSULTATION DATE:  07/08/22 REFERRING MD:  Lavonna Monarch - CVTS, CHIEF COMPLAINT:  s/p  Aortic root replacement, CABG x1   History of Present Illness:  66 yo M PMH CAD, aortic root aneurysm with severe aortic insufficiency, cardiomyopathy presented to [facility] hospital 07/08/22 for elective CABG x1 and aortic root replacement.  Cross clamp time 111 min Total pump time 153 minutes  EBL 1421m  Received 870 ml cellsaver and about 3.5 L crystalloid/colloid  Arrives to the ICU intubated post op.   Pertinent  Medical History  CAD Aortic root aneurysm Aortic Insufficiency Cardiomyopathy   Significant Hospital Events: Including procedures, antibiotic start and stop dates in addition to other pertinent events   Elective CABG x1 and aortic root replacement. ICU admission after case  9/28 now extubated, low dose pressors   Interim History / Subjective:  Remains on NE. Still has not had a BM, complaining of ongoing pain from hemorrhoids. Walked yesterday.  Objective   Blood pressure (!) 87/37, pulse 88, temperature 98.5 F (36.9 C), temperature source Oral, resp. rate 19, height '5\' 7"'$  (1.702 m), weight 78.6 kg, SpO2 (!) 89 %. CVP:  [5 mmHg-6 mmHg] 6 mmHg      Intake/Output Summary (Last 24 hours) at 07/11/2022 0914 Last data filed at 07/11/2022 0800 Gross per 24 hour  Intake 628.7 ml  Output 1840 ml  Net -1211.3 ml    Filed Weights   07/09/22 0330 07/10/22 0500 07/11/22 0500  Weight: 78.5 kg 77.8 kg 78.6 kg    General:  elderly man sitting in the recliner in NAD HEENT: [place]/AT eyes anicteric Neuro: awake and alert, moving all extremities. Answering questions appropriately. CV: S1S2, RRR, pacer dependent PULM: faint rhales posteriorly, breathing comfortably on [place] GI: soft, NT, mildly distended Extremities: pedal edema improved, no cyanosis or clubbing Skin: warm, dry, nor ashes  Coox 60% Na+  129 BUN 18 Cr 0.75 WBC 10.9  H/H 7.8/22.2 Platelets 91  Resolved Hospital Problem list     Assessment & Plan:  CAD s/p CABG x1 Aortic root aneurysm with severe AI, s/p aortic root replacement  Acute on chronic HFrEF LBBB Shock -- cardiogenic shock +/- post-cabg vasoplegia  -Con't pacing due to poor perfusion with background rhythm; defer to Cardiology is PPM should be considered. May need resynchronization therapy if ejection fraction does not recover postoperatively -post-op care per TCTS -con't inotropic support with NE; cautious diuresis with soft Bps -holding Bblocker while still requiring inotropic support -ASA, statin daily -OOB mobility -post-op pain control- morphine, oxy, tramadol  Expected post op ABLA, consumptive thrombocytopenia -call Dr. WLavonna Monarchbefore transfusing -con't to monitor  Leukocytosis, likely reactive, improving -monitor  Hyponatremia -diuresis  Hemorrhoids and constipation -adding anusol suppository -sorbitol  Hyperglycemia, minimal insulin requirements -monitor  Mother updated at bedside today.  Best Practice (right click and "Reselect all SmartList Selections" daily)   Diet/type: Regular consistency (see orders) DVT prophylaxis: SCD GI prophylaxis: H2B Lines: Central line, Arterial Line, and yes and it is still needed Foley:  Yes, and it is still needed Code Status:  full code Last date of multidisciplinary goals of care discussion [per primary]  Labs   CBC: Recent Labs  Lab 07/08/22 1919 07/08/22 1921 07/09/22 0302 07/09/22 1542 07/10/22 0419 07/10/22 0546 07/11/22 0510  WBC 12.6*  --  12.3* 14.3* 13.9*  --  10.9*  HGB 9.3*   < > 9.3* 8.9* 8.5*  8.5* 7.8*  HCT 26.1*   < > 26.3* 26.3* 23.9* 25.0* 22.2*  MCV 102.8*  --  104.8* 105.6* 104.4*  --  104.2*  PLT 100*  --  102* 107* 93*  --  91*   < > = values in this interval not displayed.     Basic Metabolic Panel: Recent Labs  Lab 07/08/22 1919 07/08/22 1921  07/09/22 0302 07/09/22 1542 07/10/22 0419 07/10/22 0546 07/11/22 0510  NA 139   < > 135 131* 131* 129* 129*  K 4.8   < > 5.4* 4.8 4.7 4.6 4.6  CL 111  --  105 99 100  --  98  CO2 22  --  20* 21* 26  --  24  GLUCOSE 117*  --  110* 145* 106*  --  102*  BUN 11  --  '12 16 17  '$ --  18  CREATININE 0.94  --  0.84 1.01 0.87  --  0.75  CALCIUM 7.6*  --  8.1* 8.5* 8.2*  --  8.1*  MG 3.0*  --  2.4 2.1  --   --   --    < > = values in this interval not displayed.    GFR: Estimated Creatinine Clearance: 82.6 mL/min (by C-G formula based on SCr of 0.75 mg/dL). Recent Labs  Lab 07/09/22 0302 07/09/22 1542 07/10/22 0419 07/11/22 0510  WBC 12.3* 14.3* 13.9* 10.9*     This patient is critically ill with multiple organ system failure which requires frequent high complexity decision making, assessment, support, evaluation, and titration of therapies. This was completed through the application of advanced monitoring technologies and extensive interpretation of multiple databases. During this encounter critical care time was devoted to patient care services described in this note for 33 minutes.  Julian Hy, DO 07/11/22 9:20 AM [facility]

## 2022-07-11 NOTE — Progress Notes (Addendum)
Patient ID: Joe [name], male   DOB: 03/02/1956, 66 y.o.   MRN: [id number]     Advanced Heart Failure Rounding Note  PCP-Cardiologist: Skeet Latch, MD   Subjective:    Lasix 20 mg IV x 1 yesterday, UOP net negative 1361.   Off epinephrine but remains on NE 5, SBP 90s-100s.  No co-ox this morning.  CVP 8-9, weight still significantly up from baseline.   He is a-paced with LBBB in 90s, underlying rhythm accelerated junctional in 70s.   Sitting in chair, no dyspnea.    Objective:   Weight Range: 78.6 kg Body mass index is 27.14 kg/m.   Vital Signs:   Temp:  [98 F (36.7 C)-98.5 F (36.9 C)] 98.5 F (36.9 C) (09/30 0828) Pulse Rate:  [55-90] 88 (09/30 0800) Resp:  [9-19] 19 (09/30 0800) BP: (82-129)/(37-82) 87/37 (09/30 0800) SpO2:  [89 %-98 %] 89 % (09/30 0800) Arterial Line BP: (94-159)/(47-84) 98/47 (09/30 0800) Weight:  [78.6 kg] 78.6 kg (09/30 0500) Last BM Date :  (PTA)  Weight change: Filed Weights   07/09/22 0330 07/10/22 0500 07/11/22 0500  Weight: 78.5 kg 77.8 kg 78.6 kg    Intake/Output:   Intake/Output Summary (Last 24 hours) at 07/11/2022 0840 Last data filed at 07/11/2022 0800 Gross per 24 hour  Intake 628.7 ml  Output 1840 ml  Net -1211.3 ml      Physical Exam    General: NAD Neck: JVP 8-9 cm, no thyromegaly or thyroid nodule.  Lungs: Clear to auscultation bilaterally with normal respiratory effort. CV: Nondisplaced PMI.  Heart regular S1/S2, no S3/S4, 1/6 SEM RUSB.  Trace ankle edema.  Abdomen: Soft, nontender, no hepatosplenomegaly, no distention.  Skin: Intact without lesions or rashes.  Neurologic: Alert and oriented x 3.  Psych: Normal affect. Extremities: No clubbing or cyanosis.  HEENT: Normal.    Telemetry   A-paced/LBBB rate 90 (personally reviewed)  EKG    N/A  Labs    CBC Recent Labs    07/10/22 0419 07/10/22 0546 07/11/22 0510  WBC 13.9*  --  10.9*  HGB 8.5* 8.5* 7.8*  HCT 23.9* 25.0* 22.2*  MCV 104.4*   --  104.2*  PLT 93*  --  91*   Basic Metabolic Panel Recent Labs    07/09/22 0302 07/09/22 1542 07/10/22 0419 07/10/22 0546 07/11/22 0510  NA 135 131* 131* 129* 129*  K 5.4* 4.8 4.7 4.6 4.6  CL 105 99 100  --  98  CO2 20* 21* 26  --  24  GLUCOSE 110* 145* 106*  --  102*  BUN '12 16 17  '$ --  18  CREATININE 0.84 1.01 0.87  --  0.75  CALCIUM 8.1* 8.5* 8.2*  --  8.1*  MG 2.4 2.1  --   --   --    Liver Function Tests No results for input(s): "AST", "ALT", "ALKPHOS", "BILITOT", "PROT", "ALBUMIN" in the last 72 hours.  No results for input(s): "LIPASE", "AMYLASE" in the last 72 hours. Cardiac Enzymes No results for input(s): "CKTOTAL", "CKMB", "CKMBINDEX", "TROPONINI" in the last 72 hours.  BNP: BNP (last 3 results) Recent Labs    06/25/22 1829  BNP 436.2*    ProBNP (last 3 results) No results for input(s): "PROBNP" in the last 8760 hours.   D-Dimer No results for input(s): "DDIMER" in the last 72 hours. Hemoglobin A1C No results for input(s): "HGBA1C" in the last 72 hours. Fasting Lipid Panel No results for input(s): "CHOL", "HDL", "LDLCALC", "TRIG", "  CHOLHDL", "LDLDIRECT" in the last 72 hours. Thyroid Function Tests No results for input(s): "TSH", "T4TOTAL", "T3FREE", "THYROIDAB" in the last 72 hours.  Invalid input(s): "FREET3"  Other results:   Imaging    No results found.   Medications:     Scheduled Medications:  acetaminophen  1,000 mg Oral Q6H   Or   acetaminophen (TYLENOL) oral liquid 160 mg/5 mL  1,000 mg Per Tube Q6H   aspirin EC  81 mg Oral Daily   atorvastatin  80 mg Oral Daily   bisacodyl  10 mg Oral Daily   Or   bisacodyl  10 mg Rectal Daily   Chlorhexidine Gluconate Cloth  6 each Topical Daily   docusate sodium  200 mg Oral Daily   furosemide  40 mg Intravenous Once   insulin aspart  0-9 Units Subcutaneous TID WC   pantoprazole  40 mg Oral Daily   polyethylene glycol  17 g Oral Daily   sodium chloride flush  10-40 mL Intracatheter  Q12H   sodium chloride flush  3 mL Intravenous Q12H   tamsulosin  0.4 mg Oral BID    Infusions:  sodium chloride Stopped (07/09/22 1410)   sodium chloride     sodium chloride 10 mL/hr at 07/11/22 0800   epinephrine Stopped (07/10/22 1830)   lactated ringers     lactated ringers 10 mL/hr at 07/11/22 0800   norepinephrine (LEVOPHED) Adult infusion 2 mcg/min (07/11/22 0800)    PRN Medications: sodium chloride, hydrocortisone, morphine injection, ondansetron (ZOFRAN) IV, oxyCODONE, sodium chloride flush, sodium chloride flush, traMADol    Patient Profile   66 y/o male recently found to have severe LV dysfunction in setting of markedly dilated aortic root (67 mm) and severe AI (tricuspid AoV) and single vessel CAD, now s/p Bentall and CABG x 1 w/ SVG-RCA. AHF team consulted to assist w/ post-operative management of systolic heart failure.     Assessment/Plan   Ascending Aortic Aneurysm/ Severe AI  - CTA  w/ severe dilation at the sinus of Valsalva, 67 mm. Moderate dilation of mid ascending aorta, 44 mm. Tricuspid AoV - s/p Bentall with bioprosthetic aortic valve 9/27 - intraoperative TEE, no perivalvular leak post repair    2. CAD - single vessel, 60-70% pRCA  - s/p CABG x 1, SVG- RCA  - ASA + statin  - hold initiation of ? blocker for now until stable off pressors/inotropes    3. Chronic Systolic Heart Failure  - newly diagnosed; Echo 9/23 EF 25-30%, LV severely dilated, severe AI, Global HK, RV ok  - NICM, severity out of proportion to degree of CAD  - likely valvular CM in setting of severe AI +/- LBBB CM  - now post op bioprosthetic AVR + CABG. Intraoperative TEE LVEF 20-25%, RV ok - He remains on NE 5, wean for SBP > 90. Co-ox 60%.  Will add low dose dobutamine to allow wean off NE hopefully.  With junctional rhythm underlying and a-pacing, will hold off on digoxin for now to avoid worsening A-V conduction.  - CVP 8-9 but weight still around 15 lbs above baseline.  Good  response to Lasix.  Will give Lasix 40 mg IV x 1 today.  - GDMT once off  pressors   4. Junctional rhythm - Patient remains in underlying accelerated junctional rhythm in 70, currently a-paced with LBBB at 90.  BP drops when a-pacing stopped. He will need CRT-D prior to discharge. Has epicardial LV lead.    5. LBBB -  CRT-D will be needed prior to discharge (see above).   6. Anemia - Post-op, transfuse < 7.5.   7. Thrombocytopenia - Mild/stable, follow.   Ambulate.    Length of Stay: 3  CRITICAL CARE Performed by: Loralie Champagne  Total critical care time: 35 minutes  Critical care time was exclusive of separately billable procedures and treating other patients.  Critical care was necessary to treat or prevent imminent or life-threatening deterioration.  Critical care was time spent personally by me (independent of midlevel providers or residents) on the following activities: development of treatment plan with patient and/or surrogate as well as nursing, discussions with consultants, evaluation of patient's response to treatment, examination of patient, obtaining history from patient or surrogate, ordering and performing treatments and interventions, ordering and review of laboratory studies, ordering and review of radiographic studies, pulse oximetry and re-evaluation of patient's condition.  Loralie Champagne, MD  8:40 AM

## 2022-07-12 ENCOUNTER — Inpatient Hospital Stay (HOSPITAL_COMMUNITY): Payer: Medicare Other

## 2022-07-12 ENCOUNTER — Encounter (HOSPITAL_COMMUNITY): Payer: Self-pay | Admitting: Thoracic Surgery (Cardiothoracic Vascular Surgery)

## 2022-07-12 DIAGNOSIS — K649 Unspecified hemorrhoids: Secondary | ICD-10-CM

## 2022-07-12 DIAGNOSIS — R011 Cardiac murmur, unspecified: Secondary | ICD-10-CM | POA: Diagnosis not present

## 2022-07-12 DIAGNOSIS — I493 Ventricular premature depolarization: Secondary | ICD-10-CM | POA: Diagnosis not present

## 2022-07-12 DIAGNOSIS — K5903 Drug induced constipation: Secondary | ICD-10-CM

## 2022-07-12 DIAGNOSIS — J9601 Acute respiratory failure with hypoxia: Secondary | ICD-10-CM | POA: Diagnosis not present

## 2022-07-12 DIAGNOSIS — R570 Cardiogenic shock: Secondary | ICD-10-CM | POA: Diagnosis not present

## 2022-07-12 DIAGNOSIS — I447 Left bundle-branch block, unspecified: Secondary | ICD-10-CM | POA: Diagnosis not present

## 2022-07-12 DIAGNOSIS — Z951 Presence of aortocoronary bypass graft: Secondary | ICD-10-CM | POA: Diagnosis not present

## 2022-07-12 DIAGNOSIS — I498 Other specified cardiac arrhythmias: Secondary | ICD-10-CM

## 2022-07-12 LAB — COOXEMETRY PANEL
Carboxyhemoglobin: 1.2 % (ref 0.5–1.5)
Carboxyhemoglobin: 1.2 % (ref 0.5–1.5)
Methemoglobin: <0.7 % (ref 0.0–1.5)
Methemoglobin: 1.3 % (ref 0.0–1.5)
O2 Saturation: 54.2 %
O2 Saturation: 57.9 %
Total hemoglobin: 7.8 g/dL — ABNORMAL LOW (ref 12.0–16.0)
Total hemoglobin: 7.5 g/dL — ABNORMAL LOW (ref 12.0–16.0)

## 2022-07-12 LAB — BASIC METABOLIC PANEL
Anion gap: 8 (ref 5–15)
BUN: 14 mg/dL (ref 8–23)
CO2: 24 mmol/L (ref 22–32)
Calcium: 8.2 mg/dL — ABNORMAL LOW (ref 8.9–10.3)
Chloride: 98 mmol/L (ref 98–111)
Creatinine, Ser: 0.76 mg/dL (ref 0.61–1.24)
GFR, Estimated: >60 mL/min (ref >60)
Glucose, Bld: 106 mg/dL — ABNORMAL HIGH (ref 70–99)
Potassium: 4.1 mmol/L (ref 3.5–5.1)
Sodium: 130 mmol/L — ABNORMAL LOW (ref 135–145)

## 2022-07-12 LAB — CBC
HCT: 21 % — ABNORMAL LOW (ref 39.0–52.0)
Hemoglobin: 7.6 g/dL — ABNORMAL LOW (ref 13.0–17.0)
MCH: 36.5 pg — ABNORMAL HIGH (ref 26.0–34.0)
MCHC: 36.2 g/dL — ABNORMAL HIGH (ref 30.0–36.0)
MCV: 101 fL — ABNORMAL HIGH (ref 80.0–100.0)
Platelets: 111 10*3/uL — ABNORMAL LOW (ref 150–400)
RBC: 2.08 MIL/uL — ABNORMAL LOW (ref 4.22–5.81)
RDW: 12.3 % (ref 11.5–15.5)
WBC: 8.5 10*3/uL (ref 4.0–10.5)
nRBC: 0 % (ref 0.0–0.2)

## 2022-07-12 LAB — GLUCOSE, CAPILLARY
Glucose-Capillary: 98 mg/dL (ref 70–99)
Glucose-Capillary: 144 mg/dL — ABNORMAL HIGH (ref 70–99)
Glucose-Capillary: 141 mg/dL — ABNORMAL HIGH (ref 70–99)
Glucose-Capillary: 113 mg/dL — ABNORMAL HIGH (ref 70–99)
Glucose-Capillary: 106 mg/dL — ABNORMAL HIGH (ref 70–99)

## 2022-07-12 MED ORDER — SIMETHICONE 80 MG PO CHEW
80.0000 mg | CHEWABLE_TABLET | Freq: Four times a day (QID) | ORAL | Status: DC | PRN
  Administered 2022-07-12 – 2022-07-13 (×2): 80 mg via ORAL
  Filled 2022-07-12 (×2): qty 1

## 2022-07-12 MED ORDER — POTASSIUM CHLORIDE CRYS ER 20 MEQ PO TBCR
40.0000 meq | EXTENDED_RELEASE_TABLET | Freq: Once | ORAL | Status: AC
  Administered 2022-07-12: 40 meq via ORAL
  Filled 2022-07-12: qty 2

## 2022-07-12 MED ORDER — FUROSEMIDE 10 MG/ML IJ SOLN
40.0000 mg | Freq: Two times a day (BID) | INTRAMUSCULAR | Status: DC
  Administered 2022-07-12 – 2022-07-13 (×3): 40 mg via INTRAVENOUS
  Filled 2022-07-12 (×3): qty 4

## 2022-07-12 MED ORDER — SODIUM CHLORIDE 0.9 % IV SOLN
12.5000 mg | Freq: Four times a day (QID) | INTRAVENOUS | Status: DC | PRN
  Filled 2022-07-12: qty 0.5

## 2022-07-12 MED ORDER — SPIRONOLACTONE 12.5 MG HALF TABLET
12.5000 mg | ORAL_TABLET | Freq: Every day | ORAL | Status: DC
  Administered 2022-07-12 – 2022-07-14 (×3): 12.5 mg via ORAL
  Filled 2022-07-12 (×3): qty 1

## 2022-07-12 MED ORDER — DOCUSATE SODIUM 100 MG PO CAPS
200.0000 mg | ORAL_CAPSULE | Freq: Two times a day (BID) | ORAL | Status: DC
  Administered 2022-07-12 – 2022-07-15 (×5): 200 mg via ORAL
  Filled 2022-07-12 (×5): qty 2

## 2022-07-12 MED ORDER — MELATONIN 5 MG PO TABS
5.0000 mg | ORAL_TABLET | Freq: Every evening | ORAL | Status: DC | PRN
  Administered 2022-07-13 – 2022-08-08 (×12): 5 mg via ORAL
  Filled 2022-07-12 (×13): qty 1

## 2022-07-12 MED ORDER — ENOXAPARIN SODIUM 40 MG/0.4ML IJ SOSY
40.0000 mg | PREFILLED_SYRINGE | INTRAMUSCULAR | Status: DC
  Administered 2022-07-12 – 2022-07-13 (×2): 40 mg via SUBCUTANEOUS
  Filled 2022-07-12 (×3): qty 0.4

## 2022-07-12 NOTE — Progress Notes (Addendum)
Patient ID: JHORDAN MCKIBBEN, male   DOB: [birth year], 66 y.o.   MRN: [id number]     Advanced Heart Failure Rounding Note  PCP-Cardiologist: Skeet Latch, MD   Subjective:    Lasix 40 mg IV x 1 yesterday, good UOP with Lasix.  CVP remains 14-15 this morning.   Off pressors with SBP 110s.  Early am co-ox 54%.    He currently appears to be in a junctional rhythm in the 70s.   Mild chest soreness, no dyspnea.    Objective:   Weight Range: 78.6 kg Body mass index is 27.14 kg/m.   Vital Signs:   Temp:  [97.5 F (36.4 C)-98.7 F (37.1 C)] 97.5 F (36.4 C) (10/01 0400) Pulse Rate:  [56-192] 57 (10/01 0600) Resp:  [12-37] 14 (10/01 0600) BP: (61-115)/(37-79) 100/75 (10/01 0600) SpO2:  [69 %-100 %] 97 % (10/01 0600) Arterial Line BP: (75-139)/(31-69) 123/48 (10/01 0600) Last BM Date :  (PTA)  Weight change: Filed Weights   07/09/22 0330 07/10/22 0500 07/11/22 0500  Weight: 78.5 kg 77.8 kg 78.6 kg    Intake/Output:   Intake/Output Summary (Last 24 hours) at 07/12/2022 0755 Last data filed at 07/12/2022 0600 Gross per 24 hour  Intake 703.49 ml  Output 2465 ml  Net -1761.51 ml      Physical Exam    General: NAD Neck: JVP 12-14, no thyromegaly or thyroid nodule.  Lungs: Clear to auscultation bilaterally with normal respiratory effort. CV: Nondisplaced PMI.  Heart regular S1/S2, no S3/S4, no murmur.  No peripheral edema.    Abdomen: Soft, nontender, no hepatosplenomegaly, no distention.  Skin: Intact without lesions or rashes.  Neurologic: Alert and oriented x 3.  Psych: Normal affect. Extremities: No clubbing or cyanosis.  HEENT: Normal.   Telemetry   Junctional rhythm in 70s (personally reviewed)  EKG    N/A  Labs    CBC Recent Labs    07/11/22 0510 07/12/22 0424  WBC 10.9* 8.5  HGB 7.8* 7.6*  HCT 22.2* 21.0*  MCV 104.2* 101.0*  PLT 91* 168*   Basic Metabolic Panel Recent Labs    07/09/22 1542 07/10/22 0419 07/11/22 0510 07/12/22 0424   NA 131*   < > 129* 130*  K 4.8   < > 4.6 4.1  CL 99   < > 98 98  CO2 21*   < > 24 24  GLUCOSE 145*   < > 102* 106*  BUN 16   < > 18 14  CREATININE 1.01   < > 0.75 0.76  CALCIUM 8.5*   < > 8.1* 8.2*  MG 2.1  --   --   --    < > = values in this interval not displayed.   Liver Function Tests No results for input(s): "AST", "ALT", "ALKPHOS", "BILITOT", "PROT", "ALBUMIN" in the last 72 hours.  No results for input(s): "LIPASE", "AMYLASE" in the last 72 hours. Cardiac Enzymes No results for input(s): "CKTOTAL", "CKMB", "CKMBINDEX", "TROPONINI" in the last 72 hours.  BNP: BNP (last 3 results) Recent Labs    06/25/22 1829  BNP 436.2*    ProBNP (last 3 results) No results for input(s): "PROBNP" in the last 8760 hours.   D-Dimer No results for input(s): "DDIMER" in the last 72 hours. Hemoglobin A1C No results for input(s): "HGBA1C" in the last 72 hours. Fasting Lipid Panel No results for input(s): "CHOL", "HDL", "LDLCALC", "TRIG", "CHOLHDL", "LDLDIRECT" in the last 72 hours. Thyroid Function Tests No results for input(s): "TSH", "T4TOTAL", "  T3FREE", "THYROIDAB" in the last 72 hours.  Invalid input(s): "FREET3"  Other results:   Imaging    No results found.   Medications:     Scheduled Medications:  acetaminophen  1,000 mg Oral Q6H   Or   acetaminophen (TYLENOL) oral liquid 160 mg/5 mL  1,000 mg Per Tube Q6H   aspirin EC  81 mg Oral Daily   atorvastatin  80 mg Oral Daily   bisacodyl  10 mg Oral Daily   Or   bisacodyl  10 mg Rectal Daily   Chlorhexidine Gluconate Cloth  6 each Topical Daily   docusate sodium  200 mg Oral Daily   furosemide  40 mg Intravenous BID   insulin aspart  0-9 Units Subcutaneous TID WC   pantoprazole  40 mg Oral Daily   polyethylene glycol  17 g Oral Daily   potassium chloride  40 mEq Oral Once   sodium chloride flush  10-40 mL Intracatheter Q12H   sodium chloride flush  3 mL Intravenous Q12H   spironolactone  12.5 mg Oral Daily    tamsulosin  0.4 mg Oral BID    Infusions:  sodium chloride Stopped (07/09/22 1410)   sodium chloride     sodium chloride 10 mL/hr at 07/12/22 0600   amiodarone 30 mg/hr (07/12/22 0600)   DOBUTamine Stopped (07/11/22 1316)   epinephrine Stopped (07/10/22 1830)   lactated ringers     lactated ringers Stopped (07/11/22 1157)   norepinephrine (LEVOPHED) Adult infusion Stopped (07/11/22 1850)    PRN Medications: sodium chloride, hydrocortisone, hydrocortisone, morphine injection, ondansetron (ZOFRAN) IV, oxyCODONE, sodium chloride flush, sodium chloride flush, traMADol    Patient Profile   66 y/o male recently found to have severe LV dysfunction in setting of markedly dilated aortic root (67 mm) and severe AI (tricuspid AoV) and single vessel CAD, now s/p Bentall and CABG x 1 w/ SVG-RCA. AHF team consulted to assist w/ post-operative management of systolic heart failure.     Assessment/Plan   Ascending Aortic Aneurysm/ Severe AI  - CTA  w/ severe dilation at the sinus of Valsalva, 67 mm. Moderate dilation of mid ascending aorta, 44 mm. Tricuspid AoV - s/p Bentall with bioprosthetic aortic valve 9/27 - intraoperative TEE, no perivalvular leak post repair    2. CAD - single vessel, 60-70% pRCA  - s/p CABG x 1, SVG- RCA  - ASA + statin  - hold initiation of ? blocker for now until stable off pressors/inotropes    3. Chronic Systolic Heart Failure  - newly diagnosed; Echo 9/23 EF 25-30%, LV severely dilated, severe AI, Global HK, RV ok  - NICM, severity out of proportion to degree of CAD  - likely valvular CM in setting of severe AI +/- LBBB CM  - now post op bioprosthetic AVR + CABG. Intraoperative TEE LVEF 20-25%, RV ok - He is off norepinephrine now with stable BP.   - Co-ox 54% early am, recheck => 58%, no changes.  Would try to avoid inotrope with AF/RVR yesterday.   - With junctional rhythm, will hold off on digoxin for now to avoid worsening A-V conduction. Eventual CRT-D  (see below).  - Add spironolactone 12.5 mg daily today.  - CVP 14-15, Lasix 40 mg IV bid today (responds well to this Lasix dose).   4. Junctional rhythm - Accelerated junctional rhythm in 70s with LBBB.  Confirm with ECG.  - He will need CRT-D prior to discharge. Has epicardial LV lead.    5.  LBBB - CRT-D will be needed prior to discharge (see above).   6. Anemia - Post-op, 7.6 today, transfuse < 7.5.   7. Thrombocytopenia - Mild, higher today. Follow.   8. Atrial fibrillation - With RVR, noted during the day yesterday.  - Now back in NSR on amiodarone gtt 30 mg/hr.  Continue for now.  - No anticoagulation unless AF recurs.   Ambulate.    Length of Stay: 4  CRITICAL CARE Performed by: Loralie Champagne  Total critical care time: 35 minutes  Critical care time was exclusive of separately billable procedures and treating other patients.  Critical care was necessary to treat or prevent imminent or life-threatening deterioration.  Critical care was time spent personally by me (independent of midlevel providers or residents) on the following activities: development of treatment plan with patient and/or surrogate as well as nursing, discussions with consultants, evaluation of patient's response to treatment, examination of patient, obtaining history from patient or surrogate, ordering and performing treatments and interventions, ordering and review of laboratory studies, ordering and review of radiographic studies, pulse oximetry and re-evaluation of patient's condition.  Loralie Champagne, MD  7:55 AM

## 2022-07-12 NOTE — Progress Notes (Signed)
NAME:  Joe [name], MRN:  [id number], DOB:  06-14-1956, LOS: 4 ADMISSION DATE:  07/08/2022, CONSULTATION DATE:  07/08/22 REFERRING MD:  Lavonna Monarch - CVTS, CHIEF COMPLAINT:  s/p  Aortic root replacement, CABG x1   History of Present Illness:  66 yo M PMH CAD, aortic root aneurysm with severe aortic insufficiency, cardiomyopathy presented to [facility] hospital 07/08/22 for elective CABG x1 and aortic root replacement.  Cross clamp time 111 min Total pump time 153 minutes  EBL 1489m  Received 870 ml cellsaver and about 3.5 L crystalloid/colloid  Arrives to the ICU intubated post op.   Pertinent  Medical History  CAD Aortic root aneurysm Aortic Insufficiency Cardiomyopathy   Significant Hospital Events: Including procedures, antibiotic start and stop dates in addition to other pertinent events   Elective CABG x1 and aortic root replacement. ICU admission after case  9/28 now extubated, low dose pressors  10/1 off pressors   Interim History / Subjective:  Didn't sleep well   Objective   Blood pressure 109/75, pulse 77, temperature 98.7 F (37.1 C), temperature source Oral, resp. rate 19, height '5\' 7"'$  (1.702 m), weight 78.6 kg, SpO2 94 %. CVP:  [4 mmHg-42 mmHg] 6 mmHg      Intake/Output Summary (Last 24 hours) at 07/12/2022 1041 Last data filed at 07/12/2022 1000 Gross per 24 hour  Intake 1017.9 ml  Output 2190 ml  Net -1172.1 ml   Filed Weights   07/09/22 0330 07/10/22 0500 07/11/22 0500  Weight: 78.5 kg 77.8 kg 78.6 kg    General:  eolder adult M NAD in bed  HEENT: NCAT pink mm anicteric sclera  Neuro: AAOx3 following commands  CV: s1s2, paced rhythm  PULM: CTAb even unlabored  GI: soft ndnt + bowel sounds  Extremities: no acute joint deformity  Skin: c/d/w    Resolved Hospital Problem list    Shock   Assessment & Plan:   CAD s/p CABG x1 Aortic root aneurysm w severe AI, s/p bentall  Acute on chronic HFrEF LBBB Accelerated junctional rhythm  Afib, improved   P -post op per CVTS -cont pacing per cards -- will need CRT-D before discharge -amio, cont for now. -spiro added 10/1, cont BID lasix  -ASA, statin  Expected post op anemia due to acute blood loss  Thrombocytopenia   -call Dr. WLavonna Monarchbefore transfusing -con't to monitor  Hyponatremia -diuresis  Hemorrhoids Constipation  -incr bowel reg -- dulcolax, BID colace, miralax -mobility   Hyperglycemia, minimal insulin requirements -sSSI if needed   Sleep disturbance At risk ICU delirium P -add melatonin -delirium precautions -mobility    Best Practice (right click and "Reselect all SmartList Selections" daily)   Diet/type: Regular consistency (see orders) DVT prophylaxis: SCD GI prophylaxis: H2B Lines: Central line, Arterial Line, and yes and it is still needed Foley:  Yes, and it is still needed Code Status:  full code Last date of multidisciplinary goals of care discussion [per primary]  Labs   CBC: Recent Labs  Lab 07/09/22 0302 07/09/22 1542 07/10/22 0419 07/10/22 0546 07/11/22 0510 07/12/22 0424  WBC 12.3* 14.3* 13.9*  --  10.9* 8.5  HGB 9.3* 8.9* 8.5* 8.5* 7.8* 7.6*  HCT 26.3* 26.3* 23.9* 25.0* 22.2* 21.0*  MCV 104.8* 105.6* 104.4*  --  104.2* 101.0*  PLT 102* 107* 93*  --  91* 111*    Basic Metabolic Panel: Recent Labs  Lab 07/08/22 1919 07/08/22 1921 07/09/22 0302 07/09/22 1542 07/10/22 0419 07/10/22 0546 07/11/22 0510 07/12/22 0424  NA 139   < > 135 131* 131* 129* 129* 130*  K 4.8   < > 5.4* 4.8 4.7 4.6 4.6 4.1  CL 111  --  105 99 100  --  98 98  CO2 22  --  20* 21* 26  --  24 24  GLUCOSE 117*  --  110* 145* 106*  --  102* 106*  BUN 11  --  '12 16 17  '$ --  18 14  CREATININE 0.94  --  0.84 1.01 0.87  --  0.75 0.76  CALCIUM 7.6*  --  8.1* 8.5* 8.2*  --  8.1* 8.2*  MG 3.0*  --  2.4 2.1  --   --   --   --    < > = values in this interval not displayed.   GFR: Estimated Creatinine Clearance: 82.6 mL/min (by C-G formula based on SCr of  0.76 mg/dL). Recent Labs  Lab 07/09/22 1542 07/10/22 0419 07/11/22 0510 07/12/22 0424  WBC 14.3* 13.9* 10.9* 8.5    CRITICAL CARE Performed by: Cristal Generous   N/a  Eliseo Gum MSN, AGACNP-BC [place] for pager 07/12/2022, 10:41 AM

## 2022-07-12 NOTE — Progress Notes (Signed)
      [address]       [place] [address]             [phone]      POD # 4  Resting comfortably but nausea, GI upset still an issue BP (!) 81/61   Pulse 61   Temp 98.5 F (36.9 C) (Oral)   Resp 10   Ht '5\' 7"'$  (1.702 m)   Wt 78.6 kg   SpO2 96%   BMI 27.14 kg/m   Intake/Output Summary (Last 24 hours) at 07/12/2022 1813 Last data filed at 07/12/2022 1700 Gross per 24 hour  Intake 911.85 ml  Output 2962 ml  Net -2050.15 ml   Diuresing well  Remo Lipps C. Roxan Hockey, MD [facility] [phone]

## 2022-07-12 NOTE — Progress Notes (Signed)
4 Days Post-Op Procedure(s) (LRB): BENTALL PROCEDURE USING RESILIA KONECT 29MM CONDUIT (N/A) CORONARY ARTERY BYPASS GRAFTING (CABG) X 1, USING ENDOSCOPICALLY HARVESTED RIGHT LEG GREATER SAPHENOUS VEIN (N/A) TRANSESOPHAGEAL ECHOCARDIOGRAM (TEE) (N/A) EPICARDIAL PACING LEAD PLACEMENT (N/A) Subjective: Feels "ok"  Objective: Vital signs in last 24 hours: Temp:  [97.5 F (36.4 C)-98.7 F (37.1 C)] 98.7 F (37.1 C) (10/01 0818) Pulse Rate:  [56-192] 69 (10/01 0800) Cardiac Rhythm: Junctional rhythm (10/01 0800) Resp:  [12-37] 23 (10/01 0800) BP: (61-115)/(49-79) 106/69 (10/01 0800) SpO2:  [69 %-100 %] 97 % (10/01 0800) Arterial Line BP: (75-139)/(31-69) 113/49 (10/01 0800)  Hemodynamic parameters for last 24 hours: CVP:  [4 mmHg-42 mmHg] 14 mmHg  Intake/Output from previous day: 09/30 0701 - 10/01 0700 In: 703.5 [I.V.:703.5] Out: 2465 [Urine:2465] Intake/Output this shift: Total I/O In: 53.2 [I.V.:53.2] Out: 125 [Urine:125]  General appearance: alert, cooperative, and no distress Neurologic: intact Heart: regular rate and rhythm Lungs: diminished breath sounds bibasilar Abdomen: normal findings: soft, non-tender  Lab Results: Recent Labs    07/11/22 0510 07/12/22 0424  WBC 10.9* 8.5  HGB 7.8* 7.6*  HCT 22.2* 21.0*  PLT 91* 111*   BMET:  Recent Labs    07/11/22 0510 07/12/22 0424  NA 129* 130*  K 4.6 4.1  CL 98 98  CO2 24 24  GLUCOSE 102* 106*  BUN 18 14  CREATININE 0.75 0.76  CALCIUM 8.1* 8.2*    PT/INR: No results for input(s): "LABPROT", "INR" in the last 72 hours. ABG    Component Value Date/Time   PHART 7.342 (L) 07/10/2022 0546   HCO3 23.4 07/10/2022 0546   TCO2 25 07/10/2022 0546   ACIDBASEDEF 2.0 07/10/2022 0546   O2SAT 57.9 07/12/2022 0806   CBG (last 3)  Recent Labs    07/11/22 2324 07/12/22 0358 07/12/22 0824  GLUCAP 133* 98 144*    Assessment/Plan: S/P Procedure(s) (LRB): BENTALL PROCEDURE USING RESILIA KONECT 29MM CONDUIT  (N/A) CORONARY ARTERY BYPASS GRAFTING (CABG) X 1, USING ENDOSCOPICALLY HARVESTED RIGHT LEG GREATER SAPHENOUS VEIN (N/A) TRANSESOPHAGEAL ECHOCARDIOGRAM (TEE) (N/A) EPICARDIAL PACING LEAD PLACEMENT (N/A) POD # 4 NEURO- intact CV- in junctional rhythm on amiodarone  Coox 58 off pressors  Meds per AHF RESP- continue IS RENAL- creatinine normal   Mild hyponatremia- stable  Continue diuresis ENDO- CBG mildly elevated GI- tolerating PO Anemia secondary to ABL- Hgb 7.6- monitor Thrombocytopenia- improved SCD for DVT prophylaxis, add enoxaparin with PLT > 100K Cardiac rehab  LOS: 4 days    Melrose Nakayama 07/12/2022

## 2022-07-13 ENCOUNTER — Encounter (HOSPITAL_COMMUNITY): Payer: Self-pay | Admitting: Thoracic Surgery (Cardiothoracic Vascular Surgery)

## 2022-07-13 DIAGNOSIS — Z95828 Presence of other vascular implants and grafts: Secondary | ICD-10-CM | POA: Diagnosis not present

## 2022-07-13 LAB — COOXEMETRY PANEL
Carboxyhemoglobin: 1.6 % — ABNORMAL HIGH (ref 0.5–1.5)
Methemoglobin: <0.7 % (ref 0.0–1.5)
O2 Saturation: 66.1 %
Total hemoglobin: 7.9 g/dL — ABNORMAL LOW (ref 12.0–16.0)

## 2022-07-13 LAB — CBC
HCT: 21.3 % — ABNORMAL LOW (ref 39.0–52.0)
Hemoglobin: 7.8 g/dL — ABNORMAL LOW (ref 13.0–17.0)
MCH: 36.8 pg — ABNORMAL HIGH (ref 26.0–34.0)
MCHC: 36.6 g/dL — ABNORMAL HIGH (ref 30.0–36.0)
MCV: 100.5 fL — ABNORMAL HIGH (ref 80.0–100.0)
Platelets: 147 10*3/uL — ABNORMAL LOW (ref 150–400)
RBC: 2.12 MIL/uL — ABNORMAL LOW (ref 4.22–5.81)
RDW: 12.6 % (ref 11.5–15.5)
WBC: 8.4 10*3/uL (ref 4.0–10.5)
nRBC: 0 % (ref 0.0–0.2)

## 2022-07-13 LAB — BASIC METABOLIC PANEL
Anion gap: 9 (ref 5–15)
Anion gap: 8 (ref 5–15)
BUN: 12 mg/dL (ref 8–23)
BUN: 16 mg/dL (ref 8–23)
CO2: 25 mmol/L (ref 22–32)
CO2: 26 mmol/L (ref 22–32)
Calcium: 8.1 mg/dL — ABNORMAL LOW (ref 8.9–10.3)
Calcium: 7.9 mg/dL — ABNORMAL LOW (ref 8.9–10.3)
Chloride: 95 mmol/L — ABNORMAL LOW (ref 98–111)
Chloride: 97 mmol/L — ABNORMAL LOW (ref 98–111)
Creatinine, Ser: 0.79 mg/dL (ref 0.61–1.24)
Creatinine, Ser: 0.84 mg/dL (ref 0.61–1.24)
GFR, Estimated: >60 mL/min (ref >60)
GFR, Estimated: >60 mL/min (ref >60)
Glucose, Bld: 105 mg/dL — ABNORMAL HIGH (ref 70–99)
Glucose, Bld: 116 mg/dL — ABNORMAL HIGH (ref 70–99)
Potassium: 3.7 mmol/L (ref 3.5–5.1)
Potassium: 4.3 mmol/L (ref 3.5–5.1)
Sodium: 129 mmol/L — ABNORMAL LOW (ref 135–145)
Sodium: 131 mmol/L — ABNORMAL LOW (ref 135–145)

## 2022-07-13 LAB — GLUCOSE, CAPILLARY: Glucose-Capillary: 114 mg/dL — ABNORMAL HIGH (ref 70–99)

## 2022-07-13 MED ORDER — POTASSIUM CHLORIDE CRYS ER 20 MEQ PO TBCR
20.0000 meq | EXTENDED_RELEASE_TABLET | ORAL | Status: AC
  Administered 2022-07-13 (×3): 20 meq via ORAL
  Filled 2022-07-13 (×2): qty 1

## 2022-07-13 MED ORDER — MIDODRINE HCL 5 MG PO TABS
5.0000 mg | ORAL_TABLET | Freq: Three times a day (TID) | ORAL | Status: DC
  Administered 2022-07-13 – 2022-07-14 (×3): 5 mg via ORAL
  Filled 2022-07-13 (×3): qty 1

## 2022-07-13 MED ORDER — LACTULOSE 10 GM/15ML PO SOLN
20.0000 g | Freq: Once | ORAL | Status: AC
  Administered 2022-07-13: 20 g via ORAL
  Filled 2022-07-13: qty 30

## 2022-07-13 MED ORDER — AMIODARONE IV BOLUS ONLY 150 MG/100ML
150.0000 mg | Freq: Once | INTRAVENOUS | Status: AC
  Administered 2022-07-13: 150 mg via INTRAVENOUS

## 2022-07-13 MED ORDER — FUROSEMIDE 10 MG/ML IJ SOLN
80.0000 mg | Freq: Once | INTRAMUSCULAR | Status: AC
  Administered 2022-07-13: 80 mg via INTRAVENOUS
  Filled 2022-07-13: qty 8

## 2022-07-13 MED ORDER — ORAL CARE MOUTH RINSE: 15.0000 mL | OROMUCOSAL | Status: DC | PRN

## 2022-07-13 NOTE — Progress Notes (Signed)
  EP continuing to follow at a distance for CRT-D once otherwise stable for d/c.     Please call with any additional questions.   Legrand Como [address] [place], PA-C  07/13/2022 8:58 AM

## 2022-07-13 NOTE — Progress Notes (Signed)
PCCM:  We will sign off at this time. On 2L [place] awaiting CRT-D pacemaker placement. Discussed with Dr. Haroldine Laws from [facility] service.  Please call if we can be of any assistance.   Thanks   Garner Nash, DO [facility] 07/13/2022 10:45 AM

## 2022-07-13 NOTE — Progress Notes (Addendum)
      [address]       [place] [address]             [phone]      5 Days Post-Op Procedure(s) (LRB): BENTALL PROCEDURE USING RESILIA KONECT 29MM CONDUIT (N/A) CORONARY ARTERY BYPASS GRAFTING (CABG) X 1, USING ENDOSCOPICALLY HARVESTED RIGHT LEG GREATER SAPHENOUS VEIN (N/A) TRANSESOPHAGEAL ECHOCARDIOGRAM (TEE) (N/A) EPICARDIAL PACING LEAD PLACEMENT (N/A)  Subjective:  Patient sitting up in chair.  Complains of stomach upset this morning.  Denies N/V.[name] Kitchen but has not yet moved bowels.  He is passing some gas.  Objective: Vital signs in last 24 hours: Temp:  [97.5 F (36.4 C)-98.7 F (37.1 C)] 97.8 F (36.6 C) (10/01 2132) Pulse Rate:  [61-102] 99 (10/02 0600) Cardiac Rhythm: Junctional rhythm (10/01 1945) Resp:  [10-23] 13 (10/02 0600) BP: (81-119)/(61-90) 114/68 (10/02 0600) SpO2:  [74 %-99 %] 97 % (10/02 0600) Arterial Line BP: (104-129)/(38-57) 124/45 (10/02 0600) Weight:  [77.8 kg] 77.8 kg (10/02 0500)  Hemodynamic parameters for last 24 hours: CVP:  [5 mmHg-16 mmHg] 5 mmHg  Intake/Output from previous day: 10/01 0701 - 10/02 0700 In: 938.7 [P.O.:300; I.V.:638.7] Out: 3917 [UUVOZ:3664]  General appearance: alert, cooperative, and no distress Heart: irregularly irregular rhythm Lungs: diminished breath sounds bibasilar Abdomen: soft, non-tender; bowel sounds normal; no masses,  no organomegaly Extremities: edema + pitting Wound: clean and dry  Lab Results: Recent Labs    07/12/22 0424 07/13/22 0434  WBC 8.5 8.4  HGB 7.6* 7.8*  HCT 21.0* 21.3*  PLT 111* 147*   BMET:  Recent Labs    07/12/22 0424 07/13/22 0434  NA 130* 129*  K 4.1 3.7  CL 98 95*  CO2 24 25  GLUCOSE 106* 105*  BUN 14 12  CREATININE 0.76 0.79  CALCIUM 8.2* 8.1*    PT/INR: No results for input(s): "LABPROT", "INR" in the last 72 hours. ABG    Component Value Date/Time   PHART 7.342 (L) 07/10/2022 0546   HCO3 23.4 07/10/2022 0546   TCO2 25 07/10/2022 0546    ACIDBASEDEF 2.0 07/10/2022 0546   O2SAT 66.1 07/13/2022 0444   CBG (last 3)  Recent Labs    07/12/22 1633 07/12/22 2122 07/13/22 0646  GLUCAP 113* 106* 114*    Assessment/Plan: S/P Procedure(s) (LRB): BENTALL PROCEDURE USING RESILIA KONECT 29MM CONDUIT (N/A) CORONARY ARTERY BYPASS GRAFTING (CABG) X 1, USING ENDOSCOPICALLY HARVESTED RIGHT LEG GREATER SAPHENOUS VEIN (N/A) TRANSESOPHAGEAL ECHOCARDIOGRAM (TEE) (N/A) EPICARDIAL PACING LEAD PLACEMENT (N/A)  Atrial Fibrillation- on IV Amiodarone, currently no BB due to previous junctional rhythm- EP following, planning for CRT-D implant Pulm- weaning oxygen as tolerated Renal-creatinine stable at 0.79, remains edematous on exam, + pitting edema, continue diuretics Expected post operative blood loss anemia, Hgb at 7.8 monitor CBGS controlled- not a diabetic, will d/c SSIP GI- constipation will add lactulose Dispo- patient stable, remain in ICU until CRT-D implant placed per EP, AHF is also following, continue current care   LOS: 5 days    Ellwood Handler, PA-C 07/13/2022  Patient seen and examined, agree with above Coox 66 this AM In accelerated junctional vs A fib- will check 12 lead  Remo Lipps C. Roxan Hockey, MD [facility] [phone]

## 2022-07-13 NOTE — Progress Notes (Signed)
Only 500 cc UOP today after 40 mg lasix IV. CVP 11 this afternoon. 2+ edema LE edema on exam. Still up from pre-op weight.   Will give 80 mg lasix IV this afternoon.   SBP soft 90s, occasionally 80s. Will add midodrine 5 TID

## 2022-07-13 NOTE — Progress Notes (Addendum)
Patient ID: Joe [name], male   DOB: [birth year], 66 y.o.   MRN: [id number]     Advanced Heart Failure Rounding Note  PCP-Cardiologist: Skeet Latch, MD   Subjective:    CO-OX 66% this am. Not on any inotropes.   CVP 9-10. 3.9L UOP yesterday with IV lasix 40 BID.   Rhythm appeared to be junctional 60s this am, around 5 am looks like went back in afib 100s.  On amio gtt at 30/hr. Currently receiving IV amio bolus.  Hasn't had a bowel movement. TCTS ordered lactulose.   Objective:   Weight Range: 77.8 kg Body mass index is 26.86 kg/m.   Vital Signs:   Temp:  [97.5 F (36.4 C)-98.5 F (36.9 C)] 97.8 F (36.6 C) (10/01 2132) Pulse Rate:  [61-102] 97 (10/02 0800) Resp:  [10-22] 20 (10/02 0800) BP: (81-119)/(59-90) 96/80 (10/02 0800) SpO2:  [74 %-99 %] 97 % (10/02 0800) Arterial Line BP: (104-129)/(37-57) 120/42 (10/02 0800) Weight:  [77.8 kg] 77.8 kg (10/02 0500) Last BM Date : 07/12/22  Weight change: Filed Weights   07/10/22 0500 07/11/22 0500 07/13/22 0500  Weight: 77.8 kg 78.6 kg 77.8 kg    Intake/Output:   Intake/Output Summary (Last 24 hours) at 07/13/2022 0837 Last data filed at 07/13/2022 0800 Gross per 24 hour  Intake 938.92 ml  Output 3792 ml  Net -2853.08 ml      Physical Exam  CVP 9-10 General:  No distress. Sitting up in chair. HEENT: normal Neck: supple. Jvp ~ 10 cm. Carotids 2+ bilat; no bruits.  Cor: PMI nondisplaced. Regular rate & rhythm. No rubs, gallops or murmurs. Sternum stable. Lungs: clear Abdomen: soft, nontender, nondistended.  Extremities: no cyanosis, clubbing, rash, 1-2 + edema Neuro: alert & orientedx3, cranial nerves grossly intact. moves all 4 extremities w/o difficulty. Affect pleasant   Telemetry   SR 60s, 5 am back in afib 100s  EKG    N/A  Labs    CBC Recent Labs    07/12/22 0424 07/13/22 0434  WBC 8.5 8.4  HGB 7.6* 7.8*  HCT 21.0* 21.3*  MCV 101.0* 100.5*  PLT 111* 948*   Basic Metabolic  Panel Recent Labs    07/12/22 0424 07/13/22 0434  NA 130* 129*  K 4.1 3.7  CL 98 95*  CO2 24 25  GLUCOSE 106* 105*  BUN 14 12  CREATININE 0.76 0.79  CALCIUM 8.2* 8.1*   Liver Function Tests No results for input(s): "AST", "ALT", "ALKPHOS", "BILITOT", "PROT", "ALBUMIN" in the last 72 hours.  No results for input(s): "LIPASE", "AMYLASE" in the last 72 hours. Cardiac Enzymes No results for input(s): "CKTOTAL", "CKMB", "CKMBINDEX", "TROPONINI" in the last 72 hours.  BNP: BNP (last 3 results) Recent Labs    06/25/22 1829  BNP 436.2*    ProBNP (last 3 results) No results for input(s): "PROBNP" in the last 8760 hours.   D-Dimer No results for input(s): "DDIMER" in the last 72 hours. Hemoglobin A1C No results for input(s): "HGBA1C" in the last 72 hours. Fasting Lipid Panel No results for input(s): "CHOL", "HDL", "LDLCALC", "TRIG", "CHOLHDL", "LDLDIRECT" in the last 72 hours. Thyroid Function Tests No results for input(s): "TSH", "T4TOTAL", "T3FREE", "THYROIDAB" in the last 72 hours.  Invalid input(s): "FREET3"  Other results:   Imaging    No results found.   Medications:     Scheduled Medications:  acetaminophen  1,000 mg Oral Q6H   Or   acetaminophen (TYLENOL) oral liquid 160 mg/5 mL  1,000 mg  Per Tube Q6H   aspirin EC  81 mg Oral Daily   atorvastatin  80 mg Oral Daily   bisacodyl  10 mg Oral Daily   Or   bisacodyl  10 mg Rectal Daily   Chlorhexidine Gluconate Cloth  6 each Topical Daily   docusate sodium  200 mg Oral BID   enoxaparin (LOVENOX) injection  40 mg Subcutaneous Q24H   furosemide  40 mg Intravenous BID   pantoprazole  40 mg Oral Daily   polyethylene glycol  17 g Oral Daily   potassium chloride  20 mEq Oral Q4H   sodium chloride flush  10-40 mL Intracatheter Q12H   sodium chloride flush  3 mL Intravenous Q12H   spironolactone  12.5 mg Oral Daily   tamsulosin  0.4 mg Oral BID    Infusions:  sodium chloride Stopped (07/09/22 1410)    sodium chloride     sodium chloride 10 mL/hr at 07/13/22 0800   amiodarone 30 mg/hr (07/13/22 0800)   lactated ringers     lactated ringers Stopped (07/11/22 1157)   promethazine (PHENERGAN) injection (IM or IVPB)      PRN Medications: sodium chloride, hydrocortisone, hydrocortisone, melatonin, morphine injection, ondansetron (ZOFRAN) IV, oxyCODONE, promethazine (PHENERGAN) injection (IM or IVPB), simethicone, sodium chloride flush, sodium chloride flush, traMADol    Patient Profile   66 y/o male recently found to have severe LV dysfunction in setting of markedly dilated aortic root (67 mm) and severe AI (tricuspid AoV) and single vessel CAD, now s/p Bentall and CABG x 1 w/ SVG-RCA. AHF team consulted to assist w/ post-operative management of systolic heart failure.     Assessment/Plan   Ascending Aortic Aneurysm/ Severe AI  - CTA  w/ severe dilation at the sinus of Valsalva, 67 mm. Moderate dilation of mid ascending aorta, 44 mm. Tricuspid AoV - s/p Bentall with bioprosthetic aortic valve 9/27 - intraoperative TEE, no perivalvular leak post repair    2. CAD - single vessel, 60-70% pRCA  - s/p CABG x 1, SVG- RCA  - ASA + statin  - hold initiation of ? blocker for now    3. Chronic Systolic Heart Failure  - newly diagnosed; Echo 9/23 EF 25-30%, LV severely dilated, severe AI, Global HK, RV ok  - NICM, severity out of proportion to degree of CAD  - likely valvular CM in setting of severe AI +/- LBBB CM  - now post op bioprosthetic AVR + CABG. Intraoperative TEE LVEF 20-25%, RV ok - He is off norepinephrine  - Co-ox 66% this am. - With junctional rhythm, will hold off on beta blocker and digoxin for now to avoid worsening A-V conduction. Eventual CRT-D (see below).  - Continue spiro 12.5 mg daily - GDMT limited by soft BP - Eventual SGLT2i - CVP 9-10. Continue IV lasix 40 BID (responds well to this Lasix dose).   4. Junctional rhythm - Accelerated junctional rhythm in 70s  with LBBB.  Confirm with ECG.  - He will need CRT-D prior to discharge. Has epicardial LV lead. EP following.   5. LBBB - CRT-D will be needed prior to discharge (see above).   6. Anemia - Post-op, 7.8 today, transfuse < 7.5.   7. Thrombocytopenia - Mild, higher today. Follow.   8. Atrial fibrillation - Intermittent AF post-op. Looked like junctional rhythm early am, now AF 100s.  - Continue amiodarone gtt 30 mg/hr, currently receiving 150 mg bolus IV amio - May need anticoagulation if continues to recur.  Mobilize  Length of Stay: 5   FINCH, LINDSAY N, PA-C  8:37 AM  Patient seen and examined with the above-signed Advanced Practice Provider and/or Housestaff. I personally reviewed laboratory data, imaging studies and relevant notes. I independently examined the patient and formulated the important aspects of the plan. I have edited the note to reflect any of my changes or salient points. I have personally discussed the plan with the patient and/or family.  C/o constipation. Received lactulose this am. Denies CP or SOB.Diuresed well over the weekend. Co-ox 66%   Rhythm looks like possible AFL/AT with variable block vs WAP. Back on amio.   General:  Sitting up No resp difficulty HEENT: normal Neck: supple. JVP 9. Carotids 2+ bilat; no bruits. No lymphadenopathy or thryomegaly appreciated. Cor: Sternal dressing ok Irregular rate & rhythm. No rubs, gallops or murmurs. Lungs: clear Abdomen: soft, nontender, nondistended. No hepatosplenomegaly. No bruits or masses. Good bowel sounds. Extremities: no cyanosis, clubbing, rash, 1+ edema Neuro: alert & orientedx3, cranial nerves grossly intact. moves all 4 extremities w/o difficulty. Affect pleasant  Still volume overloaded. Continue IV lasix. Continue IV amio. Ambulate.   Glori Bickers, MD  10:25 AM

## 2022-07-13 NOTE — Progress Notes (Signed)
CT surgery PM rounds  Patient's breathing improved after diuresis Remains in rate controlled atrial fibrillation on IV amiodarone Was able to take a short walk Eating better and had bowel movement today  Blood pressure (!) 81/56, pulse (!) 102, temperature 98 F (36.7 C), temperature source Oral, resp. rate (!) 24, height '5\' 7"'$  (1.702 m), weight 77.8 kg, SpO2 96 %.

## 2022-07-14 ENCOUNTER — Inpatient Hospital Stay (HOSPITAL_COMMUNITY): Payer: Medicare Other

## 2022-07-14 DIAGNOSIS — Z95828 Presence of other vascular implants and grafts: Secondary | ICD-10-CM | POA: Diagnosis not present

## 2022-07-14 LAB — COOXEMETRY PANEL
Carboxyhemoglobin: 2.6 % — ABNORMAL HIGH (ref 0.5–1.5)
Methemoglobin: <0.7 % (ref 0.0–1.5)
O2 Saturation: 68.9 %
Total hemoglobin: 7.6 g/dL — ABNORMAL LOW (ref 12.0–16.0)

## 2022-07-14 LAB — CBC
HCT: 21 % — ABNORMAL LOW (ref 39.0–52.0)
Hemoglobin: 7.5 g/dL — ABNORMAL LOW (ref 13.0–17.0)
MCH: 36.2 pg — ABNORMAL HIGH (ref 26.0–34.0)
MCHC: 35.7 g/dL (ref 30.0–36.0)
MCV: 101.4 fL — ABNORMAL HIGH (ref 80.0–100.0)
Platelets: 192 10*3/uL (ref 150–400)
RBC: 2.07 MIL/uL — ABNORMAL LOW (ref 4.22–5.81)
RDW: 12.9 % (ref 11.5–15.5)
WBC: 7.5 10*3/uL (ref 4.0–10.5)
nRBC: 0 % (ref 0.0–0.2)

## 2022-07-14 LAB — BASIC METABOLIC PANEL
Anion gap: 8 (ref 5–15)
BUN: 16 mg/dL (ref 8–23)
CO2: 28 mmol/L (ref 22–32)
Calcium: 8.2 mg/dL — ABNORMAL LOW (ref 8.9–10.3)
Chloride: 95 mmol/L — ABNORMAL LOW (ref 98–111)
Creatinine, Ser: 0.88 mg/dL (ref 0.61–1.24)
GFR, Estimated: >60 mL/min (ref >60)
Glucose, Bld: 111 mg/dL — ABNORMAL HIGH (ref 70–99)
Potassium: 3.9 mmol/L (ref 3.5–5.1)
Sodium: 131 mmol/L — ABNORMAL LOW (ref 135–145)

## 2022-07-14 LAB — HEPARIN LEVEL (UNFRACTIONATED): Heparin Unfractionated: >1.1 IU/mL — ABNORMAL HIGH (ref 0.30–0.70)

## 2022-07-14 LAB — MAGNESIUM: Magnesium: 1.7 mg/dL (ref 1.7–2.4)

## 2022-07-14 MED ORDER — MIDODRINE HCL 5 MG PO TABS
10.0000 mg | ORAL_TABLET | Freq: Three times a day (TID) | ORAL | Status: DC
  Administered 2022-07-14 – 2022-07-15 (×3): 10 mg via ORAL
  Filled 2022-07-14 (×3): qty 2

## 2022-07-14 MED ORDER — SORBITOL 70 % SOLN
45.0000 mL | Freq: Once | Status: AC
  Administered 2022-07-14: 45 mL via ORAL
  Filled 2022-07-14: qty 60

## 2022-07-14 MED ORDER — MAGNESIUM SULFATE 4 GM/100ML IV SOLN
4.0000 g | Freq: Once | INTRAVENOUS | Status: AC
  Administered 2022-07-14: 4 g via INTRAVENOUS
  Filled 2022-07-14: qty 100

## 2022-07-14 MED ORDER — METOLAZONE 2.5 MG PO TABS
2.5000 mg | ORAL_TABLET | Freq: Once | ORAL | Status: AC
  Administered 2022-07-14: 2.5 mg via ORAL
  Filled 2022-07-14: qty 1

## 2022-07-14 MED ORDER — FUROSEMIDE 10 MG/ML IJ SOLN
80.0000 mg | Freq: Two times a day (BID) | INTRAMUSCULAR | Status: DC
  Administered 2022-07-14 – 2022-07-15 (×3): 80 mg via INTRAVENOUS
  Filled 2022-07-14 (×3): qty 8

## 2022-07-14 MED ORDER — POTASSIUM CHLORIDE CRYS ER 20 MEQ PO TBCR
40.0000 meq | EXTENDED_RELEASE_TABLET | Freq: Once | ORAL | Status: AC
  Administered 2022-07-14: 40 meq via ORAL
  Filled 2022-07-14: qty 2

## 2022-07-14 MED ORDER — HEPARIN (PORCINE) 25000 UT/250ML-% IV SOLN
750.0000 [IU]/h | INTRAVENOUS | Status: DC
  Administered 2022-07-14: 750 [IU]/h via INTRAVENOUS
  Filled 2022-07-14: qty 250

## 2022-07-14 MED ORDER — METOCLOPRAMIDE HCL 5 MG/ML IJ SOLN
10.0000 mg | Freq: Four times a day (QID) | INTRAMUSCULAR | Status: DC
  Administered 2022-07-14 – 2022-07-24 (×40): 10 mg via INTRAVENOUS
  Filled 2022-07-14 (×39): qty 2

## 2022-07-14 MED ORDER — SODIUM CHLORIDE 0.9% FLUSH: 3.0000 mL | INTRAVENOUS | Status: DC | PRN

## 2022-07-14 MED ORDER — ~~LOC~~ CARDIAC SURGERY, PATIENT & FAMILY EDUCATION: Freq: Once | Status: AC

## 2022-07-14 MED ORDER — HEPARIN (PORCINE) 25000 UT/250ML-% IV SOLN
1100.0000 [IU]/h | INTRAVENOUS | Status: DC
  Administered 2022-07-14: 500 [IU]/h via INTRAVENOUS
  Administered 2022-07-17 – 2022-07-18 (×2): 1100 [IU]/h via INTRAVENOUS
  Filled 2022-07-14 (×5): qty 250

## 2022-07-14 MED ORDER — SODIUM CHLORIDE 0.9 % IV SOLN: 250.0000 mL | INTRAVENOUS | Status: DC | PRN

## 2022-07-14 MED ORDER — SODIUM CHLORIDE 0.9% FLUSH
3.0000 mL | Freq: Two times a day (BID) | INTRAVENOUS | Status: DC
  Administered 2022-07-14 – 2022-07-20 (×10): 3 mL via INTRAVENOUS

## 2022-07-14 NOTE — Progress Notes (Addendum)
Patient ID: Joe [name], male   DOB: 1956/09/15, 66 y.o.   MRN: [id number]     Advanced Heart Failure Rounding Note  PCP-Cardiologist: Skeet Latch, MD   Subjective:    CO-OX 69% off pressor/inotrope support.  CVP 9-10. Received total of 120 mg lasix IV yesterday. UOP less brisk. Weight down just 1 lb. Still up from preop weight.  BP soft, 90s-100s this am.  Scr and BUN stable.  Had bowel movement yesterday. Less abdominal discomfort.  ? Atrial flutter vs atrial tach with variable conduction, 100s. Continues on amio gtt at 30/hr   Objective:   Weight Range: 77.4 kg Body mass index is 26.73 kg/m.   Vital Signs:   Temp:  [97.9 F (36.6 C)-98.6 F (37 C)] 97.9 F (36.6 C) (10/03 0300) Pulse Rate:  [73-178] 80 (10/03 0730) Resp:  [12-25] 25 (10/03 0730) BP: (75-135)/(56-120) 104/60 (10/03 0730) SpO2:  [93 %-99 %] 94 % (10/03 0730) Arterial Line BP: (120)/(42) 120/42 (10/02 0800) Weight:  [77.4 kg] 77.4 kg (10/03 0500) Last BM Date : 07/12/22  Weight change: Filed Weights   07/11/22 0500 07/13/22 0500 07/14/22 0500  Weight: 78.6 kg 77.8 kg 77.4 kg    Intake/Output:   Intake/Output Summary (Last 24 hours) at 07/14/2022 0754 Last data filed at 07/14/2022 0600 Gross per 24 hour  Intake 720.19 ml  Output 1375 ml  Net -654.81 ml      Physical Exam  CVP 9-10 General:  No distress.  HEENT: normal Neck: supple. JVP ~ 10 cm. Carotids 2+ bilat; no bruits.  Cor: PMI nondisplaced. Irregular rhythm, tachy. No rubs, gallops or murmurs. Sternum stable. Lungs: clear Abdomen: soft, nontender, nondistended. Extremities: no cyanosis, clubbing, rash, 1-2+ edema. + TED hose. Neuro: alert & orientedx3, cranial nerves grossly intact. moves all 4 extremities w/o difficulty. Affect pleasant    Telemetry   ? Atrial flutter vs atrial tach 100s  EKG    N/A  Labs    CBC Recent Labs    07/13/22 0434 07/14/22 0357  WBC 8.4 7.5  HGB 7.8* 7.5*  HCT 21.3* 21.0*   MCV 100.5* 101.4*  PLT 147* 623   Basic Metabolic Panel Recent Labs    07/13/22 1609 07/14/22 0357  NA 131* 131*  K 4.3 3.9  CL 97* 95*  CO2 26 28  GLUCOSE 116* 111*  BUN 16 16  CREATININE 0.84 0.88  CALCIUM 7.9* 8.2*   Liver Function Tests No results for input(s): "AST", "ALT", "ALKPHOS", "BILITOT", "PROT", "ALBUMIN" in the last 72 hours.  No results for input(s): "LIPASE", "AMYLASE" in the last 72 hours. Cardiac Enzymes No results for input(s): "CKTOTAL", "CKMB", "CKMBINDEX", "TROPONINI" in the last 72 hours.  BNP: BNP (last 3 results) Recent Labs    06/25/22 1829  BNP 436.2*    ProBNP (last 3 results) No results for input(s): "PROBNP" in the last 8760 hours.   D-Dimer No results for input(s): "DDIMER" in the last 72 hours. Hemoglobin A1C No results for input(s): "HGBA1C" in the last 72 hours. Fasting Lipid Panel No results for input(s): "CHOL", "HDL", "LDLCALC", "TRIG", "CHOLHDL", "LDLDIRECT" in the last 72 hours. Thyroid Function Tests No results for input(s): "TSH", "T4TOTAL", "T3FREE", "THYROIDAB" in the last 72 hours.  Invalid input(s): "FREET3"  Other results:   Imaging    No results found.   Medications:     Scheduled Medications:  aspirin EC  81 mg Oral Daily   atorvastatin  80 mg Oral Daily   bisacodyl  10 mg Oral Daily   Or   bisacodyl  10 mg Rectal Daily   Chlorhexidine Gluconate Cloth  6 each Topical Daily   docusate sodium  200 mg Oral BID   enoxaparin (LOVENOX) injection  40 mg Subcutaneous Q24H   midodrine  5 mg Oral TID WC   pantoprazole  40 mg Oral Daily   polyethylene glycol  17 g Oral Daily   sodium chloride flush  10-40 mL Intracatheter Q12H   sodium chloride flush  3 mL Intravenous Q12H   spironolactone  12.5 mg Oral Daily   tamsulosin  0.4 mg Oral BID    Infusions:  sodium chloride Stopped (07/09/22 1410)   sodium chloride     sodium chloride 10 mL/hr at 07/13/22 0800   amiodarone 30 mg/hr (07/14/22 0600)    lactated ringers     lactated ringers Stopped (07/11/22 1157)   promethazine (PHENERGAN) injection (IM or IVPB)      PRN Medications: sodium chloride, hydrocortisone, hydrocortisone, melatonin, morphine injection, ondansetron (ZOFRAN) IV, mouth rinse, oxyCODONE, promethazine (PHENERGAN) injection (IM or IVPB), simethicone, sodium chloride flush, sodium chloride flush, traMADol    Patient Profile   66 y/o male recently found to have severe LV dysfunction in setting of markedly dilated aortic root (67 mm) and severe AI (tricuspid AoV) and single vessel CAD, now s/p Bentall and CABG x 1 w/ SVG-RCA. AHF team consulted to assist w/ post-operative management of systolic heart failure.     Assessment/Plan   Ascending Aortic Aneurysm/ Severe AI  - CTA  w/ severe dilation at the sinus of Valsalva, 67 mm. Moderate dilation of mid ascending aorta, 44 mm. Tricuspid AoV - s/p Bentall with bioprosthetic aortic valve 9/27 - intraoperative TEE, no perivalvular leak post repair    2. CAD - single vessel, 60-70% pRCA  - s/p CABG x 1, SVG- RCA  - ASA + statin  - hold initiation of ? blocker for now    3. Chronic Systolic Heart Failure  - newly diagnosed; Echo 9/23 EF 25-30%, LV severely dilated, severe AI, Global HK, RV ok  - NICM, severity out of proportion to degree of CAD  - likely valvular CM in setting of severe AI +/- LBBB CM  - now post op bioprosthetic AVR + CABG. Intraoperative TEE LVEF 20-25%, RV ok - He is off norepinephrine. Co-ox 69% this am. - With junctional rhythm, will hold off on beta blocker and digoxin for now to avoid worsening A-V conduction. Eventual CRT-D (see below).  - GDMT limited by soft BP. Continue midodrine 5 TID - Continue spiro 12.5 daily - Eventual SGLT2i - CVP 9-10. Still up from pre-op weight. Increase IV lasix to 80 BID and give 2.5 mg metolazone.  4. Junctional rhythm - Accelerated junctional rhythm in 70s with LBBB.  Confirm with ECG.  - He will need  CRT-D prior to discharge. Has epicardial LV lead. EP following.   5. LBBB - CRT-D will be needed prior to discharge (see above).   6. Anemia - Post-op, 7.5 today, transfuse < 7.5.   7. Thrombocytopenia - Mild, higher today. Follow.   8. Atrial arrhythmias - Intermittent AF post-op.  - Now looks like atrial flutter vs atrial tach with variable conduction - still has external pacing wires. Will attempt to pace out of AFL prior to removing. - Continue amiodarone gtt 30 mg/hr - May need anticoagulation.   Okay to transfer out of ICU today from HF perspective. Discussed with TCTS. Will attempt to  pace out of AFL/Atrial tach today.  Length of Stay: 6   FINCH, LINDSAY N, PA-C  7:54 AM  Patient seen and examined with the above-signed Advanced Practice Provider and/or Housestaff. I personally reviewed laboratory data, imaging studies and relevant notes. I independently examined the patient and formulated the important aspects of the plan. I have edited the note to reflect any of my changes or salient points. I have personally discussed the plan with the patient and/or family.  Remains in AF/AFL. Feels bloated. No significant BM. + passing gas. Minimal diuresis  CVP 8-10 Co-ox 69%  General:  Sitting in chair No resp difficulty HEENT: normal Neck: supple. JVP 8-10 Carotids 2+ bilat; no bruits. No lymphadenopathy or thryomegaly appreciated. Cor: PMI nondisplaced. Irregular rate & rhythm. No rubs, gallops or murmurs. Lungs: clear Abdomen: soft, nontender, nondistended. No hepatosplenomegaly. No bruits or masses. hypoactivebowel sounds. Extremities: no cyanosis, clubbing, rash, 1_ edema Neuro: alert & orientedx3, cranial nerves grossly intact. moves all 4 extremities w/o difficulty. Affect pleasant  I tried to pace him out of AFL but no success. Will continue IV amio. Start low-dose heparin (d/w TCTS). Continue diuresis. Will give sorbitol and Reglan.   If AF is limiting recovery will  need to proceed with DC-CV in next few days and plan CRT down the road. Otherwise plan CRT as he recovers. D/w EP.   Glori Bickers, MD  9:31 AM

## 2022-07-14 NOTE — Progress Notes (Addendum)
      [address]       [place] [address]             [phone]      6 Days Post-Op Procedure(s) (LRB): BENTALL PROCEDURE USING RESILIA KONECT 29MM CONDUIT (N/A) CORONARY ARTERY BYPASS GRAFTING (CABG) X 1, USING ENDOSCOPICALLY HARVESTED RIGHT LEG GREATER SAPHENOUS VEIN (N/A) TRANSESOPHAGEAL ECHOCARDIOGRAM (TEE) (N/A) EPICARDIAL PACING LEAD PLACEMENT (N/A)  Subjective:  Patient sitting up in chair.  His belly is feeling better.  Mother is at bedside doesn't need home DME equipment  Objective: Vital signs in last 24 hours: Temp:  [97.9 F (36.6 C)-98.6 F (37 C)] 98.1 F (36.7 C) (10/03 0826) Pulse Rate:  [73-178] 80 (10/03 0730) Cardiac Rhythm: Atrial fibrillation (10/03 0400) Resp:  [12-25] 25 (10/03 0730) BP: (75-135)/(56-120) 104/60 (10/03 0730) SpO2:  [93 %-99 %] 94 % (10/03 0730) Weight:  [77.4 kg] 77.4 kg (10/03 0500)  Hemodynamic parameters for last 24 hours: CVP:  [10 mmHg-12 mmHg] 12 mmHg  Intake/Output from previous day: 10/02 0701 - 10/03 0700 In: 720.2 [P.O.:220; I.V.:500.2] Out: 1375 [Urine:1375]  General appearance: alert, cooperative, and no distress Heart: irregularly irregular rhythm Lungs: clear to auscultation bilaterally Abdomen: soft, non-tender; bowel sounds normal; no masses,  no organomegaly Extremities: edema trace Wound: clean and dry  Lab Results: Recent Labs    07/13/22 0434 07/14/22 0357  WBC 8.4 7.5  HGB 7.8* 7.5*  HCT 21.3* 21.0*  PLT 147* 192   BMET:  Recent Labs    07/13/22 1609 07/14/22 0357  NA 131* 131*  K 4.3 3.9  CL 97* 95*  CO2 26 28  GLUCOSE 116* 111*  BUN 16 16  CREATININE 0.84 0.88  CALCIUM 7.9* 8.2*    PT/INR: No results for input(s): "LABPROT", "INR" in the last 72 hours. ABG    Component Value Date/Time   PHART 7.342 (L) 07/10/2022 0546   HCO3 23.4 07/10/2022 0546   TCO2 25 07/10/2022 0546   ACIDBASEDEF 2.0 07/10/2022 0546   O2SAT 68.9 07/14/2022 0357   CBG (last 3)   Recent Labs    07/12/22 1633 07/12/22 2122 07/13/22 0646  GLUCAP 113* 106* 114*    Assessment/Plan: S/P Procedure(s) (LRB): BENTALL PROCEDURE USING RESILIA KONECT 29MM CONDUIT (N/A) CORONARY ARTERY BYPASS GRAFTING (CABG) X 1, USING ENDOSCOPICALLY HARVESTED RIGHT LEG GREATER SAPHENOUS VEIN (N/A) TRANSESOPHAGEAL ECHOCARDIOGRAM (TEE) (N/A) EPICARDIAL PACING LEAD PLACEMENT (N/A)  CV- Atrial Fibrillation- IV Amiodarone, hypotension- continue Midodrine... AHF following, okay to place CRT-D when EP is ready Pulm- weaning oxygen as tolerated, atelectasis bilateral- continue IS Renal- creatinine stable at 0.88, remains volume overloaded, Lasix 80 mg BID ordered per AHF Expected post operative blood Hgb 7.5, monitor GI- constipation resolved Dispo- patient stable, will transfer patient to 4E, diuresis per AHF, awaiting CRT-D placement by EP, continue current care   LOS: 6 days    Ellwood Handler, PA-C 07/14/2022  Patient seen and examined, agree with above D/w Dr. Haroldine Laws- he will attempt to rapid pace this Am Continue diuresis Dc pacing wires later today  Remo Lipps C. Roxan Hockey, MD [facility] [phone]

## 2022-07-14 NOTE — Progress Notes (Signed)
ANTICOAGULATION CONSULT NOTE  Pharmacy Consult for heparin Indication: atrial fibrillation  Allergies  Allergen Reactions   Codeine Nausea Only   Levaquin [Levofloxacin In D5w] Diarrhea    Patient Measurements: Height: '5\' 7"'$  (170.2 cm) Weight: 77.4 kg (170 lb 10.2 oz) IBW/kg (Calculated) : 66.1 Heparin Dosing Weight: 77kg  Vital Signs: Temp: 98.1 F (36.7 C) (10/03 0826) Temp Source: Oral (10/03 0826) BP: 95/75 (10/03 0800) Pulse Rate: 93 (10/03 0830)  Labs: Recent Labs    07/12/22 0424 07/13/22 0434 07/13/22 1609 07/14/22 0357  HGB 7.6* 7.8*  --  7.5*  HCT 21.0* 21.3*  --  21.0*  PLT 111* 147*  --  192  CREATININE 0.76 0.79 0.84 0.88    Estimated Creatinine Clearance: 75.1 mL/min (by C-G formula based on SCr of 0.88 mg/dL).   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 [place])    Umbilical hernia      Assessment: 66 yoM s/p CABG and aortic root replacement 9/27. Pt has had ongoing AFL, pharmacy asked to start IV heparin. Given recent OHS will avoid bolusing and target lower heparin level goal. CBC stable, no AC prior to admit.  Goal of Therapy:  Heparin level 0.3-0.5 units/ml - target closer to 0.3 Monitor platelets by anticoagulation protocol: Yes    Plan:  Heparin 750 units/h no bolus Check heparin level in 6h Titrate heparin slowly  Arrie Senate, PharmD, BCPS, [facility] Clinical Pharmacist [phone] Please check AMION for all [place] numbers 07/14/2022

## 2022-07-14 NOTE — Progress Notes (Signed)
ANTICOAGULATION CONSULT NOTE  Pharmacy Consult for heparin Indication: atrial fibrillation  Allergies  Allergen Reactions   Codeine Nausea Only   Levaquin [Levofloxacin In D5w] Diarrhea    Patient Measurements: Height: '5\' 7"'$  (170.2 cm) Weight: 77.4 kg (170 lb 10.2 oz) IBW/kg (Calculated) : 66.1 Heparin Dosing Weight: 77kg  Vital Signs: Temp: 97.6 F (36.4 C) (10/03 1620) Temp Source: Oral (10/03 1620) BP: 81/50 (10/03 1300) Pulse Rate: 171 (10/03 1300)  Labs: Recent Labs    07/12/22 0424 07/13/22 0434 07/13/22 1609 07/14/22 0357 07/14/22 1545  HGB 7.6* 7.8*  --  7.5*  --   HCT 21.0* 21.3*  --  21.0*  --   PLT 111* 147*  --  192  --   HEPARINUNFRC  --   --   --   --  >1.10*  CREATININE 0.76 0.79 0.84 0.88  --      Estimated Creatinine Clearance: 75.1 mL/min (by C-G formula based on SCr of 0.88 mg/dL).   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 Pt has had ongoing AFL, pharmacy asked to start IV heparin. Given recent OHS will avoid bolusing and target lower heparin level goal. CBC stable, no AC prior to admit.  Heparin level supratherapeutic (>1.10).   Goal  of Therapy:  Heparin level 0.3-0.5 units/ml - target closer to 0.3 Monitor platelets by anticoagulation protocol: Yes   Plan:  Hold heparin infusion for 1 hr, restart '@1830'$  Restart heparin at decreased rate of 500 units/h Check heparin level in 6h Titrate heparin slowly Monitor s/sx of bleeding   Francena Hanly, PharmD Pharmacy Resident  07/14/2022 5:31 PM

## 2022-07-14 NOTE — Progress Notes (Signed)
      [address]       [place] [address]             [phone]      Up in chair, just had large BM  BP (!) 81/50   Pulse (!) 171   Temp 97.6 F (36.4 C) (Oral)   Resp 16   Ht '5\' 7"'$  (1.702 m)   Wt 77.4 kg   SpO2 93%   BMI 26.73 kg/m  Currently 102/79, HR 90-110 a fib/ flutter CVP 7  Intake/Output Summary (Last 24 hours) at 07/14/2022 1741 Last data filed at 07/14/2022 1500 Gross per 24 hour  Intake 499.63 ml  Output 3075 ml  Net -2575.37 ml   Heparin started but now on hold for elevated level  Remo Lipps C. Roxan Hockey, MD [facility] [phone]

## 2022-07-15 ENCOUNTER — Inpatient Hospital Stay (HOSPITAL_COMMUNITY): Payer: Medicare Other

## 2022-07-15 DIAGNOSIS — Z95828 Presence of other vascular implants and grafts: Secondary | ICD-10-CM | POA: Diagnosis not present

## 2022-07-15 LAB — BASIC METABOLIC PANEL
Anion gap: 12 (ref 5–15)
BUN: 24 mg/dL — ABNORMAL HIGH (ref 8–23)
CO2: 26 mmol/L (ref 22–32)
Calcium: 8 mg/dL — ABNORMAL LOW (ref 8.9–10.3)
Chloride: 89 mmol/L — ABNORMAL LOW (ref 98–111)
Creatinine, Ser: 1.24 mg/dL (ref 0.61–1.24)
GFR, Estimated: >60 mL/min (ref >60)
Glucose, Bld: 116 mg/dL — ABNORMAL HIGH (ref 70–99)
Potassium: 3.4 mmol/L — ABNORMAL LOW (ref 3.5–5.1)
Sodium: 127 mmol/L — ABNORMAL LOW (ref 135–145)

## 2022-07-15 LAB — URINALYSIS, ROUTINE W REFLEX MICROSCOPIC
Bilirubin Urine: NEGATIVE
Glucose, UA: NEGATIVE mg/dL
Hgb urine dipstick: NEGATIVE
Ketones, ur: NEGATIVE mg/dL
Leukocytes,Ua: NEGATIVE
Nitrite: NEGATIVE
Protein, ur: NEGATIVE mg/dL
Specific Gravity, Urine: 1.011 (ref 1.005–1.030)
pH: 5 (ref 5.0–8.0)

## 2022-07-15 LAB — COMPREHENSIVE METABOLIC PANEL
ALT: 25 U/L (ref 0–44)
AST: 32 U/L (ref 15–41)
Albumin: 2.5 g/dL — ABNORMAL LOW (ref 3.5–5.0)
Alkaline Phosphatase: 48 U/L (ref 38–126)
Anion gap: 7 (ref 5–15)
BUN: 30 mg/dL — ABNORMAL HIGH (ref 8–23)
CO2: 32 mmol/L (ref 22–32)
Calcium: 7.7 mg/dL — ABNORMAL LOW (ref 8.9–10.3)
Chloride: 90 mmol/L — ABNORMAL LOW (ref 98–111)
Creatinine, Ser: 1.19 mg/dL (ref 0.61–1.24)
GFR, Estimated: >60 mL/min (ref >60)
Glucose, Bld: 131 mg/dL — ABNORMAL HIGH (ref 70–99)
Potassium: 3.9 mmol/L (ref 3.5–5.1)
Sodium: 129 mmol/L — ABNORMAL LOW (ref 135–145)
Total Bilirubin: 1 mg/dL (ref 0.3–1.2)
Total Protein: 5.5 g/dL — ABNORMAL LOW (ref 6.5–8.1)

## 2022-07-15 LAB — CBC
HCT: 22.7 % — ABNORMAL LOW (ref 39.0–52.0)
HCT: 25.9 % — ABNORMAL LOW (ref 39.0–52.0)
Hemoglobin: 7.9 g/dL — ABNORMAL LOW (ref 13.0–17.0)
Hemoglobin: 9.3 g/dL — ABNORMAL LOW (ref 13.0–17.0)
MCH: 35.4 pg — ABNORMAL HIGH (ref 26.0–34.0)
MCH: 34.7 pg — ABNORMAL HIGH (ref 26.0–34.0)
MCHC: 34.8 g/dL (ref 30.0–36.0)
MCHC: 35.9 g/dL (ref 30.0–36.0)
MCV: 101.8 fL — ABNORMAL HIGH (ref 80.0–100.0)
MCV: 96.6 fL (ref 80.0–100.0)
Platelets: 287 10*3/uL (ref 150–400)
Platelets: 344 10*3/uL (ref 150–400)
RBC: 2.23 MIL/uL — ABNORMAL LOW (ref 4.22–5.81)
RBC: 2.68 MIL/uL — ABNORMAL LOW (ref 4.22–5.81)
RDW: 12.9 % (ref 11.5–15.5)
RDW: 14.6 % (ref 11.5–15.5)
WBC: 9.3 10*3/uL (ref 4.0–10.5)
WBC: 11.2 10*3/uL — ABNORMAL HIGH (ref 4.0–10.5)
nRBC: 0.2 % (ref 0.0–0.2)
nRBC: 0.4 % — ABNORMAL HIGH (ref 0.0–0.2)

## 2022-07-15 LAB — COOXEMETRY PANEL
Carboxyhemoglobin: 1.9 % — ABNORMAL HIGH (ref 0.5–1.5)
Carboxyhemoglobin: 2.1 % — ABNORMAL HIGH (ref 0.5–1.5)
Methemoglobin: <0.7 % (ref 0.0–1.5)
Methemoglobin: 0.7 % (ref 0.0–1.5)
O2 Saturation: 62.1 %
O2 Saturation: 69.9 %
Total hemoglobin: 7.7 g/dL — ABNORMAL LOW (ref 12.0–16.0)
Total hemoglobin: 13.8 g/dL (ref 12.0–16.0)

## 2022-07-15 LAB — MAGNESIUM: Magnesium: 2.3 mg/dL (ref 1.7–2.4)

## 2022-07-15 LAB — PREPARE RBC (CROSSMATCH)

## 2022-07-15 LAB — HEPARIN LEVEL (UNFRACTIONATED)
Heparin Unfractionated: <0.1 IU/mL — ABNORMAL LOW (ref 0.30–0.70)
Heparin Unfractionated: <0.1 IU/mL — ABNORMAL LOW (ref 0.30–0.70)
Heparin Unfractionated: <0.1 IU/mL — ABNORMAL LOW (ref 0.30–0.70)

## 2022-07-15 LAB — PROCALCITONIN: Procalcitonin: 0.44 ng/mL

## 2022-07-15 MED ORDER — SODIUM CHLORIDE 0.9% IV SOLUTION: Freq: Once | INTRAVENOUS | Status: AC

## 2022-07-15 MED ORDER — POTASSIUM CHLORIDE 10 MEQ/50ML IV SOLN
10.0000 meq | INTRAVENOUS | Status: AC
  Administered 2022-07-15 (×3): 10 meq via INTRAVENOUS
  Filled 2022-07-15 (×3): qty 50

## 2022-07-15 MED ORDER — NOREPINEPHRINE 16 MG/250ML-% IV SOLN
0.0000 ug/min | INTRAVENOUS | Status: DC
  Administered 2022-07-15: 15 ug/min via INTRAVENOUS
  Administered 2022-07-16: 11 ug/min via INTRAVENOUS
  Filled 2022-07-15 (×2): qty 250

## 2022-07-15 MED ORDER — TAMSULOSIN HCL 0.4 MG PO CAPS: 0.4000 mg | ORAL_CAPSULE | Freq: Every day | ORAL | Status: DC

## 2022-07-15 MED ORDER — POTASSIUM CHLORIDE CRYS ER 20 MEQ PO TBCR
20.0000 meq | EXTENDED_RELEASE_TABLET | ORAL | Status: AC
  Administered 2022-07-15 (×3): 20 meq via ORAL
  Filled 2022-07-15 (×3): qty 1

## 2022-07-15 MED ORDER — NOREPINEPHRINE 4 MG/250ML-% IV SOLN
0.0000 ug/min | INTRAVENOUS | Status: DC
  Administered 2022-07-15: 5 ug/min via INTRAVENOUS
  Filled 2022-07-15: qty 250

## 2022-07-15 NOTE — Progress Notes (Signed)
ANTICOAGULATION CONSULT NOTE  Pharmacy Consult for heparin Indication: atrial fibrillation  Allergies  Allergen Reactions   Codeine Nausea Only   Levaquin [Levofloxacin In D5w] Diarrhea    Patient Measurements: Height: '5\' 7"'$  (170.2 cm) Weight: 67.2 kg (148 lb 2.4 oz) IBW/kg (Calculated) : 66.1 Heparin Dosing Weight: 77kg  Vital Signs: Temp: 98.6 F (37 C) (10/04 2010) Temp Source: Oral (10/04 2010) BP: 110/70 (10/04 1900) Pulse Rate: 91 (10/04 1900)  Labs: Recent Labs    07/14/22 0357 07/14/22 1545 07/15/22 0039 07/15/22 0408 07/15/22 0900 07/15/22 1604 07/15/22 1714 07/15/22 2041  HGB 7.5*  --   --  7.9*  --   --  9.3*  --   HCT 21.0*  --   --  22.7*  --   --  25.9*  --   PLT 192  --   --  287  --   --  344  --   HEPARINUNFRC  --    < > <0.10*  --  <0.10*  --   --  <0.10*  CREATININE 0.88  --   --  1.24  --  1.19  --   --    < > = values in this interval not displayed.     Estimated Creatinine Clearance: 55.5 mL/min (by C-G formula based on SCr of 1.19 mg/dL).  Assessment: 1 yoM s/p CABG and aortic root replacement 9/27. Pt has had ongoing AFL, pharmacy asked to start IV heparin. Given recent OHS will avoid bolusing and target lower heparin level goal. CBC stable, no AC prior to admit.  Heparin level <0.1, infusing peripherally and drawn from IJ.  Goal of Therapy:  Heparin level 0.3-0.5 units/ml - target closer to 0.3 Monitor platelets by anticoagulation protocol: Yes   Plan:  Increase heparin to 800 units/h Follow up AM heparin level Titrate heparin slowly  Thank you Anette Guarneri, PharmD Please check AMION for all [place] [facility] numbers 07/15/2022

## 2022-07-15 NOTE — Progress Notes (Signed)
ANTICOAGULATION CONSULT NOTE  Pharmacy Consult for heparin Indication: atrial fibrillation  Allergies  Allergen Reactions   Codeine Nausea Only   Levaquin [Levofloxacin In D5w] Diarrhea    Patient Measurements: Height: '5\' 7"'$  (170.2 cm) Weight: 77.4 kg (170 lb 10.2 oz) IBW/kg (Calculated) : 66.1 Heparin Dosing Weight: 77kg  Vital Signs: Temp: 97.5 F (36.4 C) (10/04 0736) Temp Source: Oral (10/04 0736) BP: 95/64 (10/04 0800) Pulse Rate: 79 (10/04 0800)  Labs: Recent Labs    07/13/22 0434 07/13/22 1609 07/14/22 0357 07/14/22 1545 07/15/22 0039 07/15/22 0408 07/15/22 0900  HGB 7.8*  --  7.5*  --   --  7.9*  --   HCT 21.3*  --  21.0*  --   --  22.7*  --   PLT 147*  --  192  --   --  287  --   HEPARINUNFRC  --   --   --  >1.10* <0.10*  --  <0.10*  CREATININE 0.79 0.84 0.88  --   --  1.24  --      Estimated Creatinine Clearance: 53.3 mL/min (by C-G formula based on SCr of 1.24 mg/dL).   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 Pt has had ongoing AFL, pharmacy asked to start IV heparin. Given recent OHS will avoid bolusing and target lower heparin level goal. CBC  stable, no AC prior to admit.  Heparin level <0.1, infusing peripherally and drawn from IJ.  Goal of Therapy:  Heparin level 0.3-0.5 units/ml - target closer to 0.3 Monitor platelets by anticoagulation protocol: Yes   Plan:  Increase heparin to 700 units/h Check heparin level in 6h Titrate heparin slowly   Arrie Senate, PharmD, BCPS, [facility] Clinical Pharmacist [phone] Please check AMION for all [facility] Pharmacy numbers 07/15/2022

## 2022-07-15 NOTE — Progress Notes (Signed)
Dr. Cyndia Bent notified of patients trend in blood pressure this morning, MAPS 60-67. Orders received

## 2022-07-15 NOTE — Progress Notes (Signed)
NE up to 17 this afternoon.   TCTS gave 1 u RBCs. BP improving some with extra volume.  Abdominal discomfort resolved after emptying bladder.   Dr. Haroldine Laws performed bedside echo, EF < 20%, RV reduced, no evidence of pericardial effusion/tamponade  Check CO-OX and CMET.  R/o sepsis. Check procalcitonin, UA with culture.

## 2022-07-15 NOTE — Progress Notes (Signed)
  Came to see patient due to hypotension now requiring NE at 15.  Had Foley placed today due to 1,200cc urinary retention.   Denies fevers or chills. Says belly feels better.   Remains on heparin.   I did stat bedside echo LVEF 20-25% RV severely depressed. AVR ok. No effusion   Will continue NE for BP support. Check co-ox and PCT. Hold diuretics.   CCT ~ 40 mins.   Glori Bickers, MD  7:23 PM

## 2022-07-15 NOTE — Progress Notes (Addendum)
[address]       [employer] [address]             [phone]       7 Days Post-Op Procedure(s) (LRB): BENTALL PROCEDURE USING RESILIA KONECT 29MM CONDUIT (N/A) CORONARY ARTERY BYPASS GRAFTING (CABG) X 1, USING ENDOSCOPICALLY HARVESTED RIGHT LEG GREATER SAPHENOUS VEIN (N/A) TRANSESOPHAGEAL ECHOCARDIOGRAM (TEE) (N/A) EPICARDIAL PACING LEAD PLACEMENT (N/A) Subjective: Patient states his belly still hurts but it is better than yesterday  Objective: Vital signs in last 24 hours: Temp:  [97.5 F (36.4 C)-98.1 F (36.7 C)] 97.6 F (36.4 C) (10/03 1620) Pulse Rate:  [59-194] 86 (10/04 0600) Cardiac Rhythm: Atrial fibrillation;Junctional rhythm (10/03 2000) Resp:  [12-28] 24 (10/04 0600) BP: (76-155)/(46-137) 109/99 (10/04 0500) SpO2:  [82 %-97 %] 97 % (10/04 0600) FiO2 (%):  [0 %] 0 % (10/03 0837) Weight:  [77.4 kg] 77.4 kg (10/04 0500)  Hemodynamic parameters for last 24 hours: CVP:  [1 mmHg-8 mmHg] 7 mmHg  Intake/Output from previous day: 10/03 0701 - 10/04 0700 In: 606.9 [I.V.:507; IV Piggyback:99.9] Out: 2350 [Urine:2350] Intake/Output this shift: No intake/output data recorded.  General appearance: alert, cooperative, and no distress Neurologic: intact Heart: irregularly irregular rhythm Lungs: diminished breath sounds bibasilar Abdomen: soft, minimally tender; no distension, bowel sounds normal; no masses,  no organomegaly Extremities: edema trace Wound: clean, dry, intact  Lab Results: Recent Labs    07/14/22 0357 07/15/22 0408  WBC 7.5 9.3  HGB 7.5* 7.9*  HCT 21.0* 22.7*  PLT 192 287   BMET:  Recent Labs    07/14/22 0357 07/15/22 0408  NA 131* 127*  K 3.9 3.4*  CL 95* 89*  CO2 28 26  GLUCOSE 111* 116*  BUN 16 24*  CREATININE 0.88 1.24  CALCIUM 8.2* 8.0*    PT/INR: No results for input(s): "LABPROT", "INR" in the last 72 hours. ABG    Component Value Date/Time   PHART 7.342 (L) 07/10/2022 0546   HCO3 23.4 07/10/2022  0546   TCO2 25 07/10/2022 0546   ACIDBASEDEF 2.0 07/10/2022 0546   O2SAT 62.1 07/15/2022 0405   CBG (last 3)  Recent Labs    07/12/22 1633 07/12/22 2122 07/13/22 0646  GLUCAP 113* 106* 114*    Assessment/Plan: S/P Procedure(s) (LRB): BENTALL PROCEDURE USING RESILIA KONECT 29MM CONDUIT (N/A) CORONARY ARTERY BYPASS GRAFTING (CABG) X 1, USING ENDOSCOPICALLY HARVESTED RIGHT LEG GREATER SAPHENOUS VEIN (N/A) TRANSESOPHAGEAL ECHOCARDIOGRAM (TEE) (N/A) EPICARDIAL PACING LEAD PLACEMENT (N/A)  CV: atrial fib/flutter, rate 106 this morning. AHF following, attempted to pace out of aflutter yesterday without success. Now on IV amiodarone and low dose heparin. SBP 84-109, on midodrine '10mg'$  TID. Possible D/C EPW today. Junctional rhythm, CRT-D to be placed when EP feels he is ready.  Pulm: saturating 97% on 4L [place] this morning. CXR shows stable bibasilar atelectasis with possible bilateral pleural effusions. Continue IS and ambulation. Continue to wean oxygen as able. GI: Large BM yesterday improved abdominal discomfort. Gaseous distension on CXR, possible ileus. Continue reglan and stool softeners. Will monitor. Renal: Volume overload, on '80mg'$  IV lasix per AHF. UOP 100 last shift. Cr 1.24 today up from 0.88. +6kg form preop weight. K 3.4 will supplement. Hyponatremia 127, likely due to diuresis. Will discuss decreasing diuretic dose.  Anemia: Acute blood loss anemia improved, H/H 7.9/22.7. Continue to monitor. Dispo: Will discuss D/C EPW and transfer to 4E with surgeon and AHF.    LOS: 7 days    Mel Almond  C Stehler, PA-C  07/15/2022   Chart reviewed, patient examined, agree with above. I saw him early this am and BP was low normal 95/64 in rate controlled AF on IV amio.  Over the course of the morning his MAP dropped and he was started on NE by DB. Dose escalated up to 17 mcg to get MAP up. Co-ox was 62% this am. CVP was 8-9 and Hgb 7.9 so I gave him a unit of PRBC's.  Hgb increased to 9.3. NE now  down to 14 mcg. Co-ox this pm 70%.   Foley reinserted this am for bladder scan with > 1L. 1400 drained initially and has continued to have good urine output today.  He had BM yesterday but none today. He has BS and abd feels benign. KUB this afternoon shows diffuse gaseous distention throughout small and large bowl consistent with ileus. Will keep NPO except few ice chips and sips. Replace K+  Continue Reglan and minimize narcotics.  He is afebrile with WBC 11.2, PCT of 0.44.  Awake alert and seems comfortable this pm. Working on IS.

## 2022-07-15 NOTE — Progress Notes (Addendum)
Patient ID: Joe [name], male   DOB: 1956/06/19, 66 y.o.   MRN: [id number]     Advanced Heart Failure Rounding Note  PCP-Cardiologist: Joe Latch, MD   Subjective:    CO-OX 62% this am off inotrope support  Scr 0.9>1.24  Na 131>127  K 3.4  2.35L UOP charted yesterday with IV lasix 80 BID + metolazone. Urinary retention noted this am, > 1L on bladder scan.  Hypotensive with Bps down to 70s prior to foley placement. Has been on midodrine 10 TID  Had a bowel movement yesterday. Noting some lower abdominal discomfort from urinary retention.   Objective:   Weight Range: 77.4 kg Body mass index is 26.73 kg/m.   Vital Signs:   Temp:  [97.5 F (36.4 C)-97.6 F (36.4 C)] 97.5 F (36.4 C) (10/04 0736) Pulse Rate:  [59-194] 79 (10/04 0800) Resp:  [12-28] 27 (10/04 0800) BP: (76-155)/(46-137) 95/64 (10/04 0800) SpO2:  [82 %-97 %] 96 % (10/04 0800) Weight:  [77.4 kg] 77.4 kg (10/04 0500) Last BM Date : 07/14/22  Weight change: Filed Weights   07/13/22 0500 07/14/22 0500 07/15/22 0500  Weight: 77.8 kg 77.4 kg 77.4 kg    Intake/Output:   Intake/Output Summary (Last 24 hours) at 07/15/2022 0852 Last data filed at 07/15/2022 0600 Gross per 24 hour  Intake 590.25 ml  Output 2350 ml  Net -1759.75 ml      Physical Exam   General:  Sitting up in chair. HEENT: normal Neck: supple. JVP ~ 8 cm. Carotids 2+ bilat; no bruits.  Cor: PMI nondisplaced. Irregular rate & rhythm. No rubs, gallops or murmurs. Sternum stable Lungs: clear Abdomen: soft, nontender, + distended.  Extremities: no cyanosis, clubbing, rash, trace edema, + TED hose Neuro: alert & orientedx3, cranial nerves grossly intact. moves all 4 extremities w/o difficulty. Affect pleasant     Telemetry   Atrial flutter 90s-100s  EKG    [name]/A  Labs    CBC Recent Labs    07/14/22 0357 07/15/22 0408  WBC 7.5 9.3  HGB 7.5* 7.9*  HCT 21.0* 22.7*  MCV 101.4* 101.8*  PLT 192 277   Basic  Metabolic Panel Recent Labs    07/14/22 0357 07/15/22 0408  NA 131* 127*  K 3.9 3.4*  CL 95* 89*  CO2 28 26  GLUCOSE 111* 116*  BUN 16 24*  CREATININE 0.88 1.24  CALCIUM 8.2* 8.0*  MG 1.7 2.3   Liver Function Tests No results for input(s): "AST", "ALT", "ALKPHOS", "BILITOT", "PROT", "ALBUMIN" in the last 72 hours.  No results for input(s): "LIPASE", "AMYLASE" in the last 72 hours. Cardiac Enzymes No results for input(s): "CKTOTAL", "CKMB", "CKMBINDEX", "TROPONINI" in the last 72 hours.  BNP: BNP (last 3 results) Recent Labs    06/25/22 1829  BNP 436.2*    ProBNP (last 3 results) No results for input(s): "PROBNP" in the last 8760 hours.   D-Dimer No results for input(s): "DDIMER" in the last 72 hours. Hemoglobin A1C No results for input(s): "HGBA1C" in the last 72 hours. Fasting Lipid Panel No results for input(s): "CHOL", "HDL", "LDLCALC", "TRIG", "CHOLHDL", "LDLDIRECT" in the last 72 hours. Thyroid Function Tests No results for input(s): "TSH", "T4TOTAL", "T3FREE", "THYROIDAB" in the last 72 hours.  Invalid input(s): "FREET3"  Other results:   Imaging    DG Chest 2 View  Result Date: 07/15/2022 CLINICAL DATA:  Status post ascending aortic replacement. EXAM: CHEST - 2 VIEW COMPARISON:  07/14/2022 FINDINGS: 0447 hours. The cardio pericardial silhouette  is enlarged. Interval decrease in vascular congestion with persistent bibasilar collapse/consolidation and probable small bilateral pleural effusions. Telemetry leads overlie the chest. Gaseous distention of bowel loops in the visualized upper abdomen may reflect ileus IMPRESSION: Interval decrease in vascular congestion with persistent bibasilar collapse/consolidation and probable small bilateral pleural effusions. Electronically Signed   By: Misty Stanley M.D.   On: 07/15/2022 05:51     Medications:     Scheduled Medications:  aspirin EC  81 mg Oral Daily   atorvastatin  80 mg Oral Daily   bisacodyl  10 mg  Oral Daily   Or   bisacodyl  10 mg Rectal Daily   Chlorhexidine Gluconate Cloth  6 each Topical Daily   docusate sodium  200 mg Oral BID   furosemide  80 mg Intravenous BID   metoCLOPramide (REGLAN) injection  10 mg Intravenous Q6H   midodrine  10 mg Oral TID WC   pantoprazole  40 mg Oral Daily   polyethylene glycol  17 g Oral Daily   potassium chloride  20 mEq Oral Q4H   sodium chloride flush  10-40 mL Intracatheter Q12H   sodium chloride flush  3 mL Intravenous Q12H   sodium chloride flush  3 mL Intravenous Q12H   spironolactone  12.5 mg Oral Daily   tamsulosin  0.4 mg Oral BID    Infusions:  sodium chloride Stopped (07/09/22 1410)   sodium chloride     sodium chloride 10 mL/hr at 07/13/22 0800   sodium chloride     amiodarone 30 mg/hr (07/15/22 0600)   heparin 600 Units/hr (07/15/22 0600)   lactated ringers     lactated ringers Stopped (07/11/22 1157)   promethazine (PHENERGAN) injection (IM or IVPB)      PRN Medications: sodium chloride, sodium chloride, hydrocortisone, hydrocortisone, melatonin, morphine injection, ondansetron (ZOFRAN) IV, mouth rinse, oxyCODONE, promethazine (PHENERGAN) injection (IM or IVPB), simethicone, sodium chloride flush, sodium chloride flush, sodium chloride flush, traMADol    Patient Profile   66 y/o male recently found to have severe LV dysfunction in setting of markedly dilated aortic root (67 mm) and severe AI (tricuspid AoV) and single vessel CAD, now s/p Bentall and CABG x 1 w/ SVG-RCA. AHF team consulted to assist w/ post-operative management of systolic heart failure.     Assessment/Plan   Ascending Aortic Aneurysm/ Severe AI  - CTA  w/ severe dilation at the sinus of Valsalva, 67 mm. Moderate dilation of mid ascending aorta, 44 mm. Tricuspid AoV - s/p Bentall with bioprosthetic aortic valve 9/27 - intraoperative TEE, no perivalvular leak post repair    2. CAD - single vessel, 60-70% pRCA  - s/p CABG x 1, SVG- RCA  - ASA +  statin  - hold initiation of ? blocker for now    3. Chronic Systolic Heart Failure  - newly diagnosed; Echo 9/23 EF 25-30%, LV severely dilated, severe AI, Global HK, RV ok  - NICM, severity out of proportion to degree of CAD  - likely valvular CM in setting of severe AI +/- LBBB CM  - now post op bioprosthetic AVR + CABG. Intraoperative TEE LVEF 20-25%, RV ok - With junctional rhythm, will hold off on beta blocker and digoxin for now to avoid worsening A-V conduction. Eventual CRT-D (see below).  - CO-OX 62% off inotrope pressors - Hypotensive this am. Suspect vagal response in setting of urinary retention. Add back NE temporarily. - Weight unchanged from yesterday. Reweigh after bladder emptied. Hold diuretics with bump in  Scr and urinary retention.  - GDMT limited by soft BP. Continue midodrine 10 TID - Continue spiro 12.5 daily - Eventual SGLT2i  4. Junctional rhythm - Accelerated junctional rhythm in 70s with LBBB.  Confirm with ECG.  - He will need CRT-D prior to discharge. Has epicardial LV lead. EP following.   5. LBBB - CRT-D will be needed prior to discharge (see above).   6. Anemia - Post-op, 7.9 today, transfuse < 7.5.   7. Thrombocytopenia - Mild, higher today. Follow.   8. Atrial arrhythmias - Intermittent AF post-op.  - Now looks like atrial flutter vs atrial tach with variable conduction - Unsuccessful attempt to pace out AFL yesterday.  - Continue amiodarone gtt 30 mg/hr - Continue heparin gtt  9. Urinary retention - > 1L urine on bladder scan - Place foley - Holding diuretics   Length of Stay: 7   Joe [name], Joe N, PA-C  8:52 AM   Patient seen and examined with the above-signed Advanced Practice Provider and/or Housestaff. I personally reviewed laboratory data, imaging studies and relevant notes. I independently examined the patient and formulated the important aspects of the plan. I have edited the note to reflect any of my changes or salient  points. I have personally discussed the plan with the patient and/or family.  Still with ab pain. Had good BM but found to be in urinary retention. Foley being placed now.   Remains in AFL/AF. Rate controlled. Now on heparin and IV amio   CVP 8-10. Weight still up   General:  Sitting in chair No resp difficulty HEENT: normal Neck: supple RIJ cath. Carotids 2+ bilat; no bruits. No lymphadenopathy or thryomegaly appreciated. Cor: PMI nondisplaced. Irregular rate & rhythm. No rubs, gallops or murmurs. Lungs: clear Abdomen: soft, nontender, +distended. No hepatosplenomegaly. No bruits or masses. Good bowel sounds. Extremities: no cyanosis, clubbing, rash, edema Neuro: alert & orientedx3, cranial nerves grossly intact. moves all 4 extremities w/o difficulty. Affect pleasant  Remains in AF/AFL. Continue amio/heparin. Now with urinary retention. Foley being placed. Start Flomax. Continue ambulation.   Will eventually need epicardial lead hooked up for CRT but if needs DC-CV first may have to postpone CRT to outpatient. Start GDMT as tolerated   Joe Bickers, MD  10:19 AM

## 2022-07-15 NOTE — Progress Notes (Signed)
ANTICOAGULATION CONSULT NOTE - Follow Up Consult  Pharmacy Consult for heparin Indication: atrial fibrillation  Labs: Recent Labs    07/12/22 0424 07/13/22 0434 07/13/22 1609 07/14/22 0357 07/14/22 1545 07/15/22 0039  HGB 7.6* 7.8*  --  7.5*  --   --   HCT 21.0* 21.3*  --  21.0*  --   --   PLT 111* 147*  --  192  --   --   HEPARINUNFRC  --   --   --   --  >1.10* <0.10*  CREATININE 0.76 0.79 0.84 0.88  --   --     Assessment: 66yo male subtherapeutic on heparin after rate change; no infusion issues or signs of bleeding per RN.  Goal of Therapy:  Heparin level 0.3 units/ml   Plan:  Will increase heparin infusion conservatively to 600 units/hr and check level in 6 hours.    Wynona Neat, PharmD, BCPS  07/15/2022,2:57 AM

## 2022-07-16 ENCOUNTER — Inpatient Hospital Stay (HOSPITAL_COMMUNITY): Payer: Medicare Other

## 2022-07-16 DIAGNOSIS — Z95828 Presence of other vascular implants and grafts: Secondary | ICD-10-CM | POA: Diagnosis not present

## 2022-07-16 LAB — BASIC METABOLIC PANEL
Anion gap: 10 (ref 5–15)
BUN: 23 mg/dL (ref 8–23)
CO2: 28 mmol/L (ref 22–32)
Calcium: 7.8 mg/dL — ABNORMAL LOW (ref 8.9–10.3)
Chloride: 89 mmol/L — ABNORMAL LOW (ref 98–111)
Creatinine, Ser: 1.12 mg/dL (ref 0.61–1.24)
GFR, Estimated: >60 mL/min (ref >60)
Glucose, Bld: 122 mg/dL — ABNORMAL HIGH (ref 70–99)
Potassium: 3.5 mmol/L (ref 3.5–5.1)
Sodium: 127 mmol/L — ABNORMAL LOW (ref 135–145)

## 2022-07-16 LAB — POCT I-STAT, CHEM 8
BUN: 20 mg/dL (ref 8–23)
Calcium, Ion: 1.12 mmol/L — ABNORMAL LOW (ref 1.15–1.40)
Chloride: 90 mmol/L — ABNORMAL LOW (ref 98–111)
Creatinine, Ser: 1 mg/dL (ref 0.61–1.24)
Glucose, Bld: 125 mg/dL — ABNORMAL HIGH (ref 70–99)
HCT: 27 % — ABNORMAL LOW (ref 39.0–52.0)
Hemoglobin: 9.2 g/dL — ABNORMAL LOW (ref 13.0–17.0)
Potassium: 3.5 mmol/L (ref 3.5–5.1)
Sodium: 128 mmol/L — ABNORMAL LOW (ref 135–145)
TCO2: 30 mmol/L (ref 22–32)

## 2022-07-16 LAB — CBC
HCT: 25.3 % — ABNORMAL LOW (ref 39.0–52.0)
Hemoglobin: 8.9 g/dL — ABNORMAL LOW (ref 13.0–17.0)
MCH: 34.4 pg — ABNORMAL HIGH (ref 26.0–34.0)
MCHC: 35.2 g/dL (ref 30.0–36.0)
MCV: 97.7 fL (ref 80.0–100.0)
Platelets: 341 10*3/uL (ref 150–400)
RBC: 2.59 MIL/uL — ABNORMAL LOW (ref 4.22–5.81)
RDW: 14.8 % (ref 11.5–15.5)
WBC: 10.3 10*3/uL (ref 4.0–10.5)
nRBC: 0.3 % — ABNORMAL HIGH (ref 0.0–0.2)

## 2022-07-16 LAB — HEPARIN LEVEL (UNFRACTIONATED)
Heparin Unfractionated: 0.17 IU/mL — ABNORMAL LOW (ref 0.30–0.70)
Heparin Unfractionated: 0.11 IU/mL — ABNORMAL LOW (ref 0.30–0.70)
Heparin Unfractionated: 0.19 IU/mL — ABNORMAL LOW (ref 0.30–0.70)

## 2022-07-16 LAB — COOXEMETRY PANEL
Carboxyhemoglobin: 1.9 % — ABNORMAL HIGH (ref 0.5–1.5)
Methemoglobin: 1 % (ref 0.0–1.5)
O2 Saturation: 71.4 %
Total hemoglobin: 9.6 g/dL — ABNORMAL LOW (ref 12.0–16.0)

## 2022-07-16 LAB — PROCALCITONIN: Procalcitonin: 0.3 ng/mL

## 2022-07-16 MED ORDER — MIDODRINE HCL 5 MG PO TABS: 5.0000 mg | ORAL_TABLET | Freq: Three times a day (TID) | ORAL | Status: DC

## 2022-07-16 MED ORDER — POTASSIUM CHLORIDE CRYS ER 20 MEQ PO TBCR: 40.0000 meq | EXTENDED_RELEASE_TABLET | ORAL | Status: DC

## 2022-07-16 MED ORDER — FUROSEMIDE 10 MG/ML IJ SOLN
40.0000 mg | Freq: Once | INTRAMUSCULAR | Status: AC
  Administered 2022-07-16: 40 mg via INTRAVENOUS
  Filled 2022-07-16: qty 4

## 2022-07-16 MED ORDER — MIDODRINE HCL 5 MG PO TABS
10.0000 mg | ORAL_TABLET | Freq: Three times a day (TID) | ORAL | Status: DC
  Administered 2022-07-16 – 2022-07-23 (×21): 10 mg via ORAL
  Filled 2022-07-16 (×21): qty 2

## 2022-07-16 MED ORDER — POTASSIUM CHLORIDE 10 MEQ/50ML IV SOLN
10.0000 meq | INTRAVENOUS | Status: AC
  Administered 2022-07-16 (×3): 10 meq via INTRAVENOUS
  Filled 2022-07-16 (×3): qty 50

## 2022-07-16 NOTE — Progress Notes (Signed)
ANTICOAGULATION CONSULT NOTE - Follow Up Consult  Pharmacy Consult for heparin Indication: atrial fibrillation  Labs: Recent Labs    07/14/22 0357 07/14/22 1545 07/15/22 0408 07/15/22 0900 07/15/22 1604 07/15/22 1714 07/15/22 2041 07/16/22 0411  HGB 7.5*  --  7.9*  --   --  9.3*  --  8.9*  HCT 21.0*  --  22.7*  --   --  25.9*  --  25.3*  PLT 192  --  287  --   --  344  --  341  HEPARINUNFRC  --    < >  --  <0.10*  --   --  <0.10* 0.17*  CREATININE 0.88  --  1.24  --  1.19  --   --   --    < > = values in this interval not displayed.     Assessment: 66yo male remains subtherapeutic on heparin after rate change; no infusion issues or signs of bleeding per RN.  Goal of Therapy:  Heparin level 0.3 units/ml   Plan:  Will increase heparin infusion conservatively to 900 units/hr and check level in 6 hours.    Wynona Neat, PharmD, BCPS  07/16/2022,5:17 AM

## 2022-07-16 NOTE — Progress Notes (Signed)
ANTICOAGULATION CONSULT NOTE  Pharmacy Consult for heparin Indication: atrial fibrillation  Allergies  Allergen Reactions   Codeine Nausea Only   Levaquin [Levofloxacin In D5w] Diarrhea    Patient Measurements: Height: '5\' 7"'$  (170.2 cm) Weight: 76.2 kg (167 lb 15.9 oz) IBW/kg (Calculated) : 66.1 Heparin Dosing Weight: 77kg  Vital Signs: Temp: 97.8 F (36.6 C) (10/05 1200) Temp Source: Oral (10/05 1200) BP: 107/59 (10/05 1215) Pulse Rate: 63 (10/05 1215)  Labs: Recent Labs    07/15/22 0408 07/15/22 0900 07/15/22 1604 07/15/22 1714 07/15/22 2041 07/16/22 0411 07/16/22 0711 07/16/22 1116  HGB 7.9*  --   --  9.3*  --  8.9* 9.2*  --   HCT 22.7*  --   --  25.9*  --  25.3* 27.0*  --   PLT 287  --   --  344  --  341  --   --   HEPARINUNFRC  --    < >  --   --  <0.10* 0.17*  --  0.11*  CREATININE 1.24  --  1.19  --   --  1.12 1.00  --    < > = values in this interval not displayed.     Estimated Creatinine Clearance: 66.1 mL/min (by C-G formula based on SCr of 1 mg/dL).   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 Pt has had ongoing AFL, pharmacy asked to start IV  heparin. Given recent OHS will avoid bolusing and target lower heparin level goal. CBC stable, no AC prior to admit.  Heparin level 0.11, infusing peripherally and drawn from IJ. CBC stable.  Goal of Therapy:  Heparin level 0.3-0.5 units/ml - target closer to 0.3 Monitor platelets by anticoagulation protocol: Yes   Plan:  Increase heparin to 1000 units/h Check heparin level in 6h Titrate heparin slowly   Arrie Senate, PharmD, BCPS, [facility] Clinical Pharmacist [phone] Please check AMION for all [place] [facility] numbers 07/16/2022

## 2022-07-16 NOTE — Progress Notes (Signed)
      [address]       [place] [address]             [phone]      POD # 8 Bentall  BP 113/87   Pulse 72   Temp 97.8 F (36.6 C) (Oral)   Resp 20   Ht '5\' 7"'$  (1.702 m)   Wt 76.2 kg   SpO2 93%   BMI 26.31 kg/m  On norepi @ 7 CVP 7   Intake/Output Summary (Last 24 hours) at 07/16/2022 1636 Last data filed at 07/16/2022 1600 Gross per 24 hour  Intake 1188.66 ml  Output 3075 ml  Net -1886.34 ml    Continue diuresis, wean norepi as BP allows  Remo Lipps C. Roxan Hockey, MD [facility] [phone]'

## 2022-07-16 NOTE — Progress Notes (Addendum)
Patient ID: BYRNE CAPEK, male   DOB: December 03, 1955, 66 y.o.   MRN: [id number]     Advanced Heart Failure Rounding Note  PCP-Cardiologist: Skeet Latch, MD   Subjective:    10/04: Hypotensive. NE added back. Foley placed for urinary retention. Received 1 u RBCs. Bedside echo: LVEF 20-25%, RV severely reduced, AVR okay, no pericardial effusion  NE 17>>11. CO-OX 71%.   Procalcitonin 0.44>0.30. UA okay.   MAP 70s-80s  CVP 10  Scr 1.19, Na 129, K 3.9  No recurrent abdominal pain. Denies dyspnea. Wants to stand up.     Objective:   Weight Range: 76.2 kg Body mass index is 26.31 kg/m.   Vital Signs:   Temp:  [97.5 F (36.4 C)-98.9 F (37.2 C)] 98.2 F (36.8 C) (10/04 2336) Pulse Rate:  [72-180] 94 (10/05 0315) Resp:  [14-30] 18 (10/05 0315) BP: (69-129)/(50-93) 101/72 (10/05 0315) SpO2:  [89 %-98 %] 96 % (10/05 0315) Weight:  [67.2 kg-76.2 kg] 76.2 kg (10/05 0600) Last BM Date : 07/14/22  Weight change: Filed Weights   07/15/22 0500 07/15/22 1000 07/16/22 0600  Weight: 77.4 kg 67.2 kg 76.2 kg    Intake/Output:   Intake/Output Summary (Last 24 hours) at 07/16/2022 0723 Last data filed at 07/16/2022 0600 Gross per 24 hour  Intake 1403.27 ml  Output 3125 ml  Net -1721.73 ml      Physical Exam   General:  Sitting up in chair.  HEENT: normal Neck: supple. Jvp ~ 10 cm. Carotids 2+ bilat; no bruits. R IJ introducer. Cor: PMI nondisplaced. Irregular rhythm, tachy. No rubs, gallops or murmurs. Lungs: clear Abdomen: soft, nontender, nondistended.  Extremities: no cyanosis, clubbing, rash, 2+ edema, + TED hose Neuro: alert & orientedx3, cranial nerves grossly intact. moves all 4 extremities w/o difficulty. Affect pleasant    Telemetry   Atrial flutter 90s  EKG    N/A  Labs    CBC Recent Labs    07/15/22 1714 07/16/22 0411  WBC 11.2* 10.3  HGB 9.3* 8.9*  HCT 25.9* 25.3*  MCV 96.6 97.7  PLT 344 672   Basic Metabolic Panel Recent Labs     07/14/22 0357 07/15/22 0408 07/15/22 1604  NA 131* 127* 129*  K 3.9 3.4* 3.9  CL 95* 89* 90*  CO2 28 26 32  GLUCOSE 111* 116* 131*  BUN 16 24* 30*  CREATININE 0.88 1.24 1.19  CALCIUM 8.2* 8.0* 7.7*  MG 1.7 2.3  --    Liver Function Tests Recent Labs    07/15/22 1604  AST 32  ALT 25  ALKPHOS 48  BILITOT 1.0  PROT 5.5*  ALBUMIN 2.5*    No results for input(s): "LIPASE", "AMYLASE" in the last 72 hours. Cardiac Enzymes No results for input(s): "CKTOTAL", "CKMB", "CKMBINDEX", "TROPONINI" in the last 72 hours.  BNP: BNP (last 3 results) Recent Labs    06/25/22 1829  BNP 436.2*    ProBNP (last 3 results) No results for input(s): "PROBNP" in the last 8760 hours.   D-Dimer No results for input(s): "DDIMER" in the last 72 hours. Hemoglobin A1C No results for input(s): "HGBA1C" in the last 72 hours. Fasting Lipid Panel No results for input(s): "CHOL", "HDL", "LDLCALC", "TRIG", "CHOLHDL", "LDLDIRECT" in the last 72 hours. Thyroid Function Tests No results for input(s): "TSH", "T4TOTAL", "T3FREE", "THYROIDAB" in the last 72 hours.  Invalid input(s): "FREET3"  Other results:   Imaging    DG CHEST PORT 1 VIEW  Result Date: 07/16/2022 CLINICAL DATA:  Status post AVR. EXAM: PORTABLE CHEST 1 VIEW COMPARISON:  07/15/2022 FINDINGS: 0523 hours. The cardio pericardial silhouette is enlarged. right IJ sheath again noted. Bibasilar atelectasis again noted, left greater than right with probable small pleural effusions. Gaseous distension of bowel noted in the visualized upper abdomen. IMPRESSION: No significant change in exam. Bibasilar atelectasis, left greater than right with probable small pleural effusions. Electronically Signed   By: Misty Stanley M.D.   On: 07/16/2022 06:44   DG Abd 1 View  Result Date: 07/15/2022 CLINICAL DATA:  Status post ascending aortic replacement. EXAM: ABDOMEN - 1 VIEW COMPARISON:  Abdominal radiographs 01/09/2014 and 04/24/2013. Abdominal CT  09/17/2020. FINDINGS: 1229 hours. Two supine views are submitted. Diffuse gaseous distension of the small and large bowel, most likely due to a postoperative ileus. No supine evidence of free intraperitoneal air or bowel wall thickening. Patient has undergone bilateral total hip arthroplasty and lower lumbar fusion. No acute osseous findings are evident. Left pleural effusion and left basilar atelectasis as seen on recent chest radiographs. IMPRESSION: Diffuse gaseous distension of the small and large bowel, most likely due to postoperative ileus. Electronically Signed   By: Richardean Sale M.D.   On: 07/15/2022 13:20     Medications:     Scheduled Medications:  aspirin EC  81 mg Oral Daily   atorvastatin  80 mg Oral Daily   bisacodyl  10 mg Oral Daily   Or   bisacodyl  10 mg Rectal Daily   Chlorhexidine Gluconate Cloth  6 each Topical Daily   docusate sodium  200 mg Oral BID   metoCLOPramide (REGLAN) injection  10 mg Intravenous Q6H   pantoprazole  40 mg Oral Daily   polyethylene glycol  17 g Oral Daily   sodium chloride flush  10-40 mL Intracatheter Q12H   sodium chloride flush  3 mL Intravenous Q12H   sodium chloride flush  3 mL Intravenous Q12H   tamsulosin  0.4 mg Oral BID    Infusions:  sodium chloride Stopped (07/09/22 1410)   sodium chloride     sodium chloride 10 mL/hr at 07/13/22 0800   sodium chloride     amiodarone 30 mg/hr (07/16/22 0600)   heparin 900 Units/hr (07/16/22 0600)   lactated ringers     lactated ringers Stopped (07/11/22 1157)   norepinephrine (LEVOPHED) Adult infusion 11 mcg/min (07/16/22 0600)   promethazine (PHENERGAN) injection (IM or IVPB)      PRN Medications: sodium chloride, sodium chloride, hydrocortisone, hydrocortisone, melatonin, morphine injection, ondansetron (ZOFRAN) IV, mouth rinse, oxyCODONE, promethazine (PHENERGAN) injection (IM or IVPB), simethicone, sodium chloride flush, sodium chloride flush, sodium chloride flush,  traMADol    Patient Profile   66 y/o male recently found to have severe LV dysfunction in setting of markedly dilated aortic root (67 mm) and severe AI (tricuspid AoV) and single vessel CAD, now s/p Bentall and CABG x 1 w/ SVG-RCA. AHF team consulted to assist w/ post-operative management of systolic heart failure.     Assessment/Plan   Ascending Aortic Aneurysm/ Severe AI  - CTA  w/ severe dilation at the sinus of Valsalva, 67 mm. Moderate dilation of mid ascending aorta, 44 mm. Tricuspid AoV - s/p Bentall with bioprosthetic aortic valve 9/27 - intraoperative TEE, no perivalvular leak post repair    2. CAD - single vessel, 60-70% pRCA  - s/p CABG x 1, SVG- RCA  - ASA + statin  - hold initiation of ? blocker for now    3. Chronic Systolic  Heart Failure  - newly diagnosed; Echo 9/23 EF 25-30%, LV severely dilated, severe AI, Global HK, RV ok  - NICM, severity out of proportion to degree of CAD  - likely valvular CM in setting of severe AI +/- LBBB CM  - now post op bioprosthetic AVR + CABG. Intraoperative TEE LVEF 20-25%, RV ok - With junctional rhythm, will hold off on beta blocker and digoxin for now to avoid worsening A-V conduction. Eventual CRT-D (see below).  - Hypotensive 10/04 in setting of urinary retention. NE added back. STAT echo with no evidence of pericardial effusion. EF < 20%, RV depressed, AVR okay. - CO-OX 71% this am on 11 NE.  Wean NE as tolerated to maintain MAP > 70. Added back low-dose midodrine to facilitate pressor wean. - CVP 10 this am. Give 40 mg lasix IV. Still up from pre-op weight. - GDMT limited by soft BP.  - Continue spiro 12.5 daily - Eventual SGLT2i  4. Junctional rhythm - Accelerated junctional rhythm in 70s with LBBB.  Confirm with ECG.  - Has epicardial LV lead. EP following. Considering CRT-D prior to discharge. See below.   5. LBBB -Will eventually need epicardial lead hooked up for CRT but if needs DC-CV first may have to postpone CRT  to outpatient.   6. Anemia - Post-op, 1 u RBCs 10/04. Hgb 8.9 today, transfuse < 7.5.   7. Thrombocytopenia - Resolved.  8. Atrial arrhythmias - Intermittent AF post-op.  - Now looks like atrial flutter vs atrial tach with variable conduction - Unsuccessful attempt to pace out AFL  - May need DC-CV prior to discharge - Continue amiodarone gtt 30 mg/hr - Continue heparin gtt  9. Urinary retention - Placed foley - Started Flomax - UA okay  Continue to mobilize.   Length of Stay: 8   FINCH, LINDSAY N, PA-C  7:23 AM  Agree with above.   Remains on NE at 11 (down from 17). Belly feeling better after foley placed. Imaging suggestive of ileus. Not passing much gas. Remains in AFL. On IV amio and heparin. Hgb stable  Bedside echo 10/4  yesterday EF 20% severe RV dysfunction. AVR looks good. No effusion.   General:  Sitting up in bed No resp difficulty HEENT: normal Neck: supple. JVP 8-9 Carotids 2+ bilat; no bruits. No lymphadenopathy or thryomegaly appreciated. Cor: PMI nondisplaced. Irregular rate & rhythm. No rubs, gallops or murmurs. Lungs: clear Abdomen: soft, nontender, nondistended. No hepatosplenomegaly. No bruits or masses. Minimal bowel sounds. Extremities: no cyanosis, clubbing, rash, edema + Foley  Neuro: alert & orientedx3, cranial nerves grossly intact. moves all 4 extremities w/o difficulty. Affect pleasant  Now back on NE. Co-ox ok. Will titrate NE to wean midodrine. Has ileus. Now NPO per TCTS. Continue Reglan and ambulation.   Suspect he will need TEE/DC-CV next week and may have to push back hooking up LV epicardial lead. We will see.   CRITICAL CARE Performed by: Glori Bickers  Total critical care time: 35 minutes  Critical care time was exclusive of separately billable procedures and treating other patients.  Critical care was necessary to treat or prevent imminent or life-threatening deterioration.  Critical care was time spent personally by  me (independent of midlevel providers or residents) on the following activities: development of treatment plan with patient and/or surrogate as well as nursing, discussions with consultants, evaluation of patient's response to treatment, examination of patient, obtaining history from patient or surrogate, ordering and performing treatments and interventions, ordering and  review of laboratory studies, ordering and review of radiographic studies, pulse oximetry and re-evaluation of patient's condition.  Glori Bickers, MD  8:35 AM

## 2022-07-16 NOTE — Progress Notes (Addendum)
[address]       [place] [address]             [phone]       8 Days Post-Op Procedure(s) (LRB): BENTALL PROCEDURE USING RESILIA KONECT 29MM CONDUIT (N/A) CORONARY ARTERY BYPASS GRAFTING (CABG) X 1, USING ENDOSCOPICALLY HARVESTED RIGHT LEG GREATER SAPHENOUS VEIN (N/A) TRANSESOPHAGEAL ECHOCARDIOGRAM (TEE) (N/A) EPICARDIAL PACING LEAD PLACEMENT (N/A) Subjective: Patient's abdominal discomfort has improved, no new complaints  Objective: Vital signs in last 24 hours: Temp:  [97.5 F (36.4 C)-98.9 F (37.2 C)] 98.2 F (36.8 C) (10/04 2336) Pulse Rate:  [72-180] 94 (10/05 0315) Cardiac Rhythm: Junctional rhythm (10/04 2000) Resp:  [14-30] 18 (10/05 0315) BP: (69-129)/(50-93) 101/72 (10/05 0315) SpO2:  [89 %-98 %] 96 % (10/05 0315) Weight:  [67.2 kg-76.2 kg] 76.2 kg (10/05 0600)  Hemodynamic parameters for last 24 hours: CVP:  [1 mmHg-13 mmHg] 8 mmHg  Intake/Output from previous day: 10/04 0701 - 10/05 0700 In: 1403.3 [I.V.:959.3; Blood:294; IV Piggyback:150] Out: 3125 [Urine:3125] Intake/Output this shift: No intake/output data recorded.  General appearance: alert, cooperative, and no distress Neurologic: intact Heart: irregularly irregular rhythm Lungs: diminished breath sounds bibasilar Abdomen: hypoactive bowel sounds, nondistended, nontender Extremities: edema trace Wound: clean, dry, intact  Lab Results: Recent Labs    07/15/22 1714 07/16/22 0411 07/16/22 0711  WBC 11.2* 10.3  --   HGB 9.3* 8.9* 9.2*  HCT 25.9* 25.3* 27.0*  PLT 344 341  --    BMET:  Recent Labs    07/15/22 0408 07/15/22 1604 07/16/22 0711  NA 127* 129* 128*  K 3.4* 3.9 3.5  CL 89* 90* 90*  CO2 26 32  --   GLUCOSE 116* 131* 125*  BUN 24* 30* 20  CREATININE 1.24 1.19 1.00  CALCIUM 8.0* 7.7*  --     PT/INR: No results for input(s): "LABPROT", "INR" in the last 72 hours. ABG    Component Value Date/Time   PHART 7.342 (L) 07/10/2022 0546   HCO3 23.4  07/10/2022 0546   TCO2 30 07/16/2022 0711   ACIDBASEDEF 2.0 07/10/2022 0546   O2SAT 71.4 07/16/2022 0411   CBG (last 3)  No results for input(s): "GLUCAP" in the last 72 hours.  Assessment/Plan: S/P Procedure(s) (LRB): BENTALL PROCEDURE USING RESILIA KONECT 29MM CONDUIT (N/A) CORONARY ARTERY BYPASS GRAFTING (CABG) X 1, USING ENDOSCOPICALLY HARVESTED RIGHT LEG GREATER SAPHENOUS VEIN (N/A) TRANSESOPHAGEAL ECHOCARDIOGRAM (TEE) (N/A) EPICARDIAL PACING LEAD PLACEMENT (N/A)  CV: Atrial fibrillation, rate controlled 97-102 this AM. On IV amiodarone and heparin. Leave wires with backup pacing at 50. SBP 101 this AM on Levophed 71mg due to low MAP yesterday. Wean Levophed as tolerated. Will restart midodrine '10mg'$  TID. AHF following.  Pulm: CXR shows stable bibasilar atelectasis and possible bilateral pleural effusions. Saturating 97% on 4L [place]. Continue to wean oxygen as able. Continue IS.  GI: KUB shows gaseous distension, likely postoperative ileus. Start bowel rest, NPO with sips and chips. Continue reglan.  Renal: Bladder scan showed urinary retention yesterday, foley inserted. UO 3125 mL over 24 hours. Cr 1.00 today. Flomax started per AHF. K 3.5, will supplement.  ID: Tmax 98.9, WBC 10.3. No signs of infection.  Expected ABLA: H/H 9.2/27 after 1U blood transfusion yesterday. Will continue to monitor.   LOS: 8 days    BMagdalene River PA-C 07/16/2022   Chart reviewed, patient examined, agree with above. He has been hemodynamically stable but still on NE 11 with good Co-ox of  71%. Will start midodrine for BP support and wean NE as tolerated.  His abdomen is benign but he has hypoactive BS and is not passing flatus. He did have BM two days ago. CXR this am still shows distended bowel loops in upper abdomen so I suspect he has some degree of ileus. Would keep NPO except meds and ice chips/sips until he is passing gas or has BM. Keep K+ level up. Minimize narcotics.

## 2022-07-16 NOTE — Progress Notes (Signed)
ANTICOAGULATION CONSULT NOTE  Pharmacy Consult for heparin Indication: atrial fibrillation  Allergies  Allergen Reactions   Codeine Nausea Only   Levaquin [Levofloxacin In D5w] Diarrhea    Patient Measurements: Height: '5\' 7"'$  (170.2 cm) Weight: 76.2 kg (167 lb 15.9 oz) IBW/kg (Calculated) : 66.1 Heparin Dosing Weight: 76.2 kg  Vital Signs: Temp: 97.8 F (36.6 C) (10/05 1600) Temp Source: Oral (10/05 1600) BP: 120/88 (10/05 1830) Pulse Rate: 86 (10/05 1830)  Labs: Recent Labs    07/15/22 0408 07/15/22 0900 07/15/22 1604 07/15/22 1714 07/15/22 2041 07/16/22 0411 07/16/22 0711 07/16/22 1116 07/16/22 1757  HGB 7.9*  --   --  9.3*  --  8.9* 9.2*  --   --   HCT 22.7*  --   --  25.9*  --  25.3* 27.0*  --   --   PLT 287  --   --  344  --  341  --   --   --   HEPARINUNFRC  --    < >  --   --    < > 0.17*  --  0.11* 0.19*  CREATININE 1.24  --  1.19  --   --  1.12 1.00  --   --    < > = values in this interval not displayed.     Estimated Creatinine Clearance: 66.1 mL/min (by C-G formula based on SCr of 1 mg/dL).   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 Pt has  had ongoing AFL, pharmacy asked to start IV heparin. Given recent OHS will avoid bolusing and target lower heparin level goal. CBC stable, no AC prior to admit.  Heparin level 0.19. CBC stable.  Goal of Therapy:  Heparin level 0.3-0.5 units/ml - target closer to 0.3 Monitor platelets by anticoagulation protocol: Yes   Plan:  Increase heparin to 1100 units/h Check heparin level in 6h Titrate heparin slowly  Narda Amber PharmD, BCPS Clinical Pharmacist  Please check AMION for all [place] numbers 07/16/2022

## 2022-07-17 DIAGNOSIS — I4891 Unspecified atrial fibrillation: Secondary | ICD-10-CM

## 2022-07-17 DIAGNOSIS — Z95828 Presence of other vascular implants and grafts: Secondary | ICD-10-CM | POA: Diagnosis not present

## 2022-07-17 DIAGNOSIS — I5032 Chronic diastolic (congestive) heart failure: Secondary | ICD-10-CM

## 2022-07-17 DIAGNOSIS — I447 Left bundle-branch block, unspecified: Secondary | ICD-10-CM | POA: Diagnosis not present

## 2022-07-17 LAB — CBC
HCT: 25.2 % — ABNORMAL LOW (ref 39.0–52.0)
Hemoglobin: 9 g/dL — ABNORMAL LOW (ref 13.0–17.0)
MCH: 34.6 pg — ABNORMAL HIGH (ref 26.0–34.0)
MCHC: 35.7 g/dL (ref 30.0–36.0)
MCV: 96.9 fL (ref 80.0–100.0)
Platelets: 288 10*3/uL (ref 150–400)
RBC: 2.6 MIL/uL — ABNORMAL LOW (ref 4.22–5.81)
RDW: 14.6 % (ref 11.5–15.5)
WBC: 10.1 10*3/uL (ref 4.0–10.5)
nRBC: 0 % (ref 0.0–0.2)

## 2022-07-17 LAB — COOXEMETRY PANEL
Carboxyhemoglobin: 2.9 % — ABNORMAL HIGH (ref 0.5–1.5)
Methemoglobin: <0.7 % (ref 0.0–1.5)
O2 Saturation: 83.4 %
Total hemoglobin: 9 g/dL — ABNORMAL LOW (ref 12.0–16.0)

## 2022-07-17 LAB — BASIC METABOLIC PANEL
Anion gap: 11 (ref 5–15)
BUN: 16 mg/dL (ref 8–23)
CO2: 28 mmol/L (ref 22–32)
Calcium: 8.4 mg/dL — ABNORMAL LOW (ref 8.9–10.3)
Chloride: 91 mmol/L — ABNORMAL LOW (ref 98–111)
Creatinine, Ser: 0.91 mg/dL (ref 0.61–1.24)
GFR, Estimated: >60 mL/min (ref >60)
Glucose, Bld: 125 mg/dL — ABNORMAL HIGH (ref 70–99)
Potassium: 3.3 mmol/L — ABNORMAL LOW (ref 3.5–5.1)
Sodium: 130 mmol/L — ABNORMAL LOW (ref 135–145)

## 2022-07-17 LAB — PROCALCITONIN: Procalcitonin: 0.15 ng/mL

## 2022-07-17 LAB — GLUCOSE, CAPILLARY
Glucose-Capillary: 122 mg/dL — ABNORMAL HIGH (ref 70–99)
Glucose-Capillary: 146 mg/dL — ABNORMAL HIGH (ref 70–99)

## 2022-07-17 LAB — HEPARIN LEVEL (UNFRACTIONATED)
Heparin Unfractionated: 0.41 IU/mL (ref 0.30–0.70)
Heparin Unfractionated: 0.41 IU/mL (ref 0.30–0.70)

## 2022-07-17 MED ORDER — POTASSIUM CHLORIDE 10 MEQ/50ML IV SOLN: 10.0000 meq | INTRAVENOUS | Status: DC

## 2022-07-17 MED ORDER — POTASSIUM CHLORIDE 10 MEQ/50ML IV SOLN
10.0000 meq | INTRAVENOUS | Status: AC
  Administered 2022-07-17 (×6): 10 meq via INTRAVENOUS
  Filled 2022-07-17 (×2): qty 50

## 2022-07-17 MED ORDER — LACTULOSE 10 GM/15ML PO SOLN
10.0000 g | Freq: Once | ORAL | Status: AC
  Administered 2022-07-17: 10 g via ORAL
  Filled 2022-07-17: qty 15

## 2022-07-17 MED ORDER — POLYETHYLENE GLYCOL 3350 17 G PO PACK
17.0000 g | PACK | Freq: Every day | ORAL | Status: DC
  Administered 2022-07-17 – 2022-07-21 (×5): 17 g via ORAL
  Filled 2022-07-17 (×5): qty 1

## 2022-07-17 NOTE — Progress Notes (Signed)
Electrophysiology Rounding Note  Patient Name: Joe [name] Date of Encounter: 07/17/2022  Primary Cardiologist: Skeet Latch, MD Electrophysiologist: New to Dr. Quentin Ore   Subjective   Feeling OK this am.   Denies pain.   Inpatient Medications    Scheduled Meds:  aspirin EC  81 mg Oral Daily   atorvastatin  80 mg Oral Daily   Chlorhexidine Gluconate Cloth  6 each Topical Daily   metoCLOPramide (REGLAN) injection  10 mg Intravenous Q6H   midodrine  10 mg Oral TID WC   pantoprazole  40 mg Oral Daily   sodium chloride flush  10-40 mL Intracatheter Q12H   sodium chloride flush  3 mL Intravenous Q12H   sodium chloride flush  3 mL Intravenous Q12H   tamsulosin  0.4 mg Oral BID   Continuous Infusions:  sodium chloride Stopped (07/09/22 1410)   sodium chloride     sodium chloride 10 mL/hr at 07/13/22 0800   sodium chloride     amiodarone 30 mg/hr (07/17/22 0600)   heparin 1,100 Units/hr (07/17/22 0600)   lactated ringers     lactated ringers Stopped (07/11/22 1157)   norepinephrine (LEVOPHED) Adult infusion 5 mcg/min (07/17/22 0600)   potassium chloride     promethazine (PHENERGAN) injection (IM or IVPB)     PRN Meds: sodium chloride, sodium chloride, hydrocortisone, hydrocortisone, melatonin, morphine injection, ondansetron (ZOFRAN) IV, mouth rinse, oxyCODONE, promethazine (PHENERGAN) injection (IM or IVPB), sodium chloride flush, sodium chloride flush, sodium chloride flush, traMADol   Vital Signs    Vitals:   07/17/22 0500 07/17/22 0600 07/17/22 0616 07/17/22 0700  BP: 102/83  114/72 110/70  Pulse: 70 (!) 106  95  Resp: '17  12 16  '$ Temp:      TempSrc:      SpO2: 95%   94%  Weight:  75.3 kg    Height:        Intake/Output Summary (Last 24 hours) at 07/17/2022 0730 Last data filed at 07/17/2022 0600 Gross per 24 hour  Intake 902.66 ml  Output 2625 ml  Net -1722.34 ml   Filed Weights   07/15/22 1000 07/16/22 0600 07/17/22 0600  Weight: 67.2 kg 76.2 kg  75.3 kg    Physical Exam    GEN- The patient is fatigued appearing, alert and oriented x 3 today.   Head- normocephalic, atraumatic Eyes-  Sclera clear, conjunctiva pink Ears- hearing intact Oropharynx- clear Neck- supple Lungs- Clear to ausculation bilaterally, normal work of breathing Heart- Irregularly irregular rate and rhythm, no murmurs, rubs or gallops GI- soft, NT, ND, + BS Extremities- no clubbing or cyanosis. No edema Skin- no rash or lesion Psych- euthymic mood, full affect Neuro- strength and sensation are intact  Labs    CBC Recent Labs    07/16/22 0411 07/16/22 0711 07/17/22 0511  WBC 10.3  --  10.1  HGB 8.9* 9.2* 9.0*  HCT 25.3* 27.0* 25.2*  MCV 97.7  --  96.9  PLT 341  --  211   Basic Metabolic Panel Recent Labs    07/15/22 0408 07/15/22 1604 07/16/22 0411 07/16/22 0711 07/17/22 0511  NA 127*   < > 127* 128* 130*  K 3.4*   < > 3.5 3.5 3.3*  CL 89*   < > 89* 90* 91*  CO2 26   < > 28  --  28  GLUCOSE 116*   < > 122* 125* 125*  BUN 24*   < > '23 20 16  '$ CREATININE 1.24   < >  1.12 1.00 0.91  CALCIUM 8.0*   < > 7.8*  --  8.4*  MG 2.3  --   --   --   --    < > = values in this interval not displayed.   Liver Function Tests Recent Labs    07/15/22 1604  AST 32  ALT 25  ALKPHOS 48  BILITOT 1.0  PROT 5.5*  ALBUMIN 2.5*   No results for input(s): "LIPASE", "AMYLASE" in the last 72 hours. Cardiac Enzymes No results for input(s): "CKTOTAL", "CKMB", "CKMBINDEX", "TROPONINI" in the last 72 hours.   Telemetry    Atrial fibrillation 90-120s with LBBB (personally reviewed)  Radiology    DG CHEST PORT 1 VIEW  Result Date: 07/16/2022 CLINICAL DATA:  Status post AVR. EXAM: PORTABLE CHEST 1 VIEW COMPARISON:  07/15/2022 FINDINGS: 0523 hours. The cardio pericardial silhouette is enlarged. right IJ sheath again noted. Bibasilar atelectasis again noted, left greater than right with probable small pleural effusions. Gaseous distension of bowel noted in  the visualized upper abdomen. IMPRESSION: No significant change in exam. Bibasilar atelectasis, left greater than right with probable small pleural effusions. Electronically Signed   By: Misty Stanley M.D.   On: 07/16/2022 06:44   DG Abd 1 View  Result Date: 07/15/2022 CLINICAL DATA:  Status post ascending aortic replacement. EXAM: ABDOMEN - 1 VIEW COMPARISON:  Abdominal radiographs 01/09/2014 and 04/24/2013. Abdominal CT 09/17/2020. FINDINGS: 1229 hours. Two supine views are submitted. Diffuse gaseous distension of the small and large bowel, most likely due to a postoperative ileus. No supine evidence of free intraperitoneal air or bowel wall thickening. Patient has undergone bilateral total hip arthroplasty and lower lumbar fusion. No acute osseous findings are evident. Left pleural effusion and left basilar atelectasis as seen on recent chest radiographs. IMPRESSION: Diffuse gaseous distension of the small and large bowel, most likely due to postoperative ileus. Electronically Signed   By: Richardean Sale M.D.   On: 07/15/2022 13:20    Patient Profile     Joe [name] is a 66 y.o. male with a hx of CAD, VHD (severe AI), LBBB, chronic CHF (systolic), dilated AO root/ascending Ao who is being seen 07/10/2022 for the timing of ICD implant at the request of Dr. Lavonna Monarch.  On 07/08/22 he underwent  Aortic Root replacement with reimplantation of coronary buttons with a 29 mm KONECT Resilia valve conduit (Size 61m, model 11060A, SN [id number] 2.   CABG X 1 with RSVG from aortic graft to mid RCA   3.   Endoscopic greater saphenous vein harvest on the right 4.   Epicardial pacemaker leads x 2 (REF [id number] ( REF [id number]  Assessment & Plan    Chronic systolic CHF LBBB Echo 98/41EF 25-30%, LV severely dilated, severe AI, Global HK, RV ok  NICM, severity out of proportion to degree of CAD  Likely valvular CM in setting of severe AI +/- LBBB CM  Epicardial CRT wire(s) placed at time of  AVR +CABG Plan for eventual CRT-D.   Given complicated post op course with new fib/flutter requiring heparinization and OQulinwith potential cardioversion, we will plan to follow up as an outpatient for scheduling of his CRT-D implant.   3. Ascending Aortic Aneurysm/ Severe AI  S/p Bentall with bioprosthetic aortic valve 9/27 Iintraoperative TEE, no perivalvular leak post repair    4. CAD s/p CABG x 1, SVG- RCA  Per primary   5. Anemia 6. Thrombocytopenia PLT  288 (10/06 0511) HGB  9.0* (10/06 0511)   7. Atrial arrhythmias Intermittent fib/fluter post op now persistent over past several days.  Attempts to pace out were unsuccessful.  Currently on amio gtt and heparin with potential plans to [facility] prior to d/c.  Would then need [facility] to not be interrupted, so as above will likely plan CRT implant as outpatient.   Dr. Quentin Ore has seen.   Close EP follow up made to schedule CRT-D implant as outpatient. Please call us back with further questions or if plans regarding cardioversion change.   For questions or updates, please contact [place] Please consult [email or web address] for contact info under Cardiology/STEMI.  Signed, Shirley Friar, PA-C  07/17/2022, 7:30 AM

## 2022-07-17 NOTE — Progress Notes (Addendum)
[address]       [place] [address]             [phone]       9 Days Post-Op Procedure(s) (LRB): BENTALL PROCEDURE USING RESILIA KONECT 29MM CONDUIT (N/A) CORONARY ARTERY BYPASS GRAFTING (CABG) X 1, USING ENDOSCOPICALLY HARVESTED RIGHT LEG GREATER SAPHENOUS VEIN (N/A) TRANSESOPHAGEAL ECHOCARDIOGRAM (TEE) (N/A) EPICARDIAL PACING LEAD PLACEMENT (N/A) Subjective: Patient states his belly continues to feel better, no new complaints  Objective: Vital signs in last 24 hours: Temp:  [97.8 F (36.6 C)-98.5 F (36.9 C)] 98.4 F (36.9 C) (10/06 0407) Pulse Rate:  [63-151] 95 (10/06 0700) Cardiac Rhythm: Atrial fibrillation (10/06 0200) Resp:  [12-26] 16 (10/06 0700) BP: (76-204)/(57-138) 110/70 (10/06 0700) SpO2:  [86 %-99 %] 94 % (10/06 0700) Weight:  [75.3 kg] 75.3 kg (10/06 0600)  Hemodynamic parameters for last 24 hours: CVP:  [4 mmHg-10 mmHg] 4 mmHg  Intake/Output from previous day: 10/05 0701 - 10/06 0700 In: 902.7 [I.V.:752.7; IV Piggyback:150] Out: 2625 [Urine:2625] Intake/Output this shift: No intake/output data recorded.  General appearance: alert, cooperative, and no distress Neurologic: intact Heart: irregularly irregular rhythm Lungs: Diminished breath sounds bibasilar Abdomen: hypoactive bowel sounds, nontender, nondistended Extremities: edema trace Wound: Clean, dry, intact  Lab Results: Recent Labs    07/16/22 0411 07/16/22 0711 07/17/22 0511  WBC 10.3  --  10.1  HGB 8.9* 9.2* 9.0*  HCT 25.3* 27.0* 25.2*  PLT 341  --  288   BMET:  Recent Labs    07/16/22 0411 07/16/22 0711 07/17/22 0511  NA 127* 128* 130*  K 3.5 3.5 3.3*  CL 89* 90* 91*  CO2 28  --  28  GLUCOSE 122* 125* 125*  BUN '23 20 16  '$ CREATININE 1.12 1.00 0.91  CALCIUM 7.8*  --  8.4*    PT/INR: No results for input(s): "LABPROT", "INR" in the last 72 hours. ABG    Component Value Date/Time   PHART 7.342 (L) 07/10/2022 0546   HCO3 23.4 07/10/2022  0546   TCO2 30 07/16/2022 0711   ACIDBASEDEF 2.0 07/10/2022 0546   O2SAT 83.4 07/17/2022 0512   CBG (last 3)  No results for input(s): "GLUCAP" in the last 72 hours.  Assessment/Plan: S/P Procedure(s) (LRB): BENTALL PROCEDURE USING RESILIA KONECT 29MM CONDUIT (N/A) CORONARY ARTERY BYPASS GRAFTING (CABG) X 1, USING ENDOSCOPICALLY HARVESTED RIGHT LEG GREATER SAPHENOUS VEIN (N/A) TRANSESOPHAGEAL ECHOCARDIOGRAM (TEE) (N/A) EPICARDIAL PACING LEAD PLACEMENT (N/A)  CV: Atrial fibrillation rate 100-115 this AM. On IV amiodarone and heparin. SBP 85-114, on NE 68mg, midodrine '10mg'$  TID. Continue to wean NE as BP tolerates. AHF following..[name] KitchenMarland KitchenRT-D for junctional rhythm once recovered, possible DC-CV for postop afib/aflutter.  Pulm: Saturating 96% on 2L [place]. Stable CXR with bibasilar atelectasis yesterday. Continue ambulation and IS.  GI: Passing gas, no bowel movement since 10/03. Likely postoperative ileus. NPO with sips and chips at the moment. Will discuss with surgeon increasing diet today.  Renal: Cr 0.91. Good UO 2.62L over 24 hours. Wt +4kg from preop. '40mg'$  IV lasix given yesterday per AHF. Hyponatremia improving. K 3.3, will supplement.  Expected postoperative ABLA: H/H stable 9/25.2, will monitor   LOS: 9 days    BMagdalene River PA-C 07/17/2022   Chart reviewed, patient examined, agree with above. He is feeling better. He has some BS and passing flatus but no BM yet. Advance to clear liquid diet. He is weaning off NE on po midodrine. Co-ox 83% this am.

## 2022-07-17 NOTE — Progress Notes (Signed)
ANTICOAGULATION CONSULT NOTE  Pharmacy Consult for heparin Indication: atrial fibrillation  Allergies  Allergen Reactions   Codeine Nausea Only   Levaquin [Levofloxacin In D5w] Diarrhea    Patient Measurements: Height: '5\' 7"'$  (170.2 cm) Weight: 75.3 kg (166 lb 0.1 oz) IBW/kg (Calculated) : 66.1 Heparin Dosing Weight: 77kg  Vital Signs: Temp: 97.8 F (36.6 C) (10/06 0821) Temp Source: Oral (10/06 0821) BP: 112/100 (10/06 1000) Pulse Rate: 93 (10/06 1000)  Labs: Recent Labs    07/15/22 1714 07/15/22 2041 07/16/22 0411 07/16/22 0711 07/16/22 1116 07/16/22 1757 07/17/22 0131 07/17/22 0511 07/17/22 0845  HGB 9.3*  --  8.9* 9.2*  --   --   --  9.0*  --   HCT 25.9*  --  25.3* 27.0*  --   --   --  25.2*  --   PLT 344  --  341  --   --   --   --  288  --   HEPARINUNFRC  --    < > 0.17*  --    < > 0.19* 0.41  --  0.41  CREATININE  --   --  1.12 1.00  --   --   --  0.91  --    < > = values in this interval not displayed.     Estimated Creatinine Clearance: 72.6 mL/min (by C-G formula based on SCr of 0.91 mg/dL).   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 Pt has had  ongoing AFL, pharmacy asked to start IV heparin. Given recent OHS will avoid bolusing and target lower heparin level goal. CBC stable, no AC prior to admit.  Heparin level therapeutic at 0.41, CBC stable. Will aim to keep heparin level between 0.3 and 0.5 given recent sternotomy.  Goal of Therapy:  Heparin level 0.3-0.5 units/ml Monitor platelets by anticoagulation protocol: Yes   Plan:  Continue heparin 1100 units/h Daily heparin level and CBC   Arrie Senate, PharmD, [place], [facility] Clinical Pharmacist [phone] Please check AMION for all [facility] Pharmacy numbers 07/17/2022

## 2022-07-17 NOTE — Progress Notes (Signed)
ANTICOAGULATION CONSULT NOTE - Follow Up Consult  Pharmacy Consult for heparin Indication: atrial fibrillation  Labs: Recent Labs    07/15/22 0408 07/15/22 0900 07/15/22 1604 07/15/22 1714 07/15/22 2041 07/16/22 0411 07/16/22 0711 07/16/22 1116 07/16/22 1757 07/17/22 0131  HGB 7.9*  --   --  9.3*  --  8.9* 9.2*  --   --   --   HCT 22.7*  --   --  25.9*  --  25.3* 27.0*  --   --   --   PLT 287  --   --  344  --  341  --   --   --   --   HEPARINUNFRC  --    < >  --   --    < > 0.17*  --  0.11* 0.19* 0.41  CREATININE 1.24  --  1.19  --   --  1.12 1.00  --   --   --    < > = values in this interval not displayed.    Assessment/Plan:  66yo male therapeutic on heparin after conservative rate changes. Will continue infusion at current rate of 1100 units/hr and confirm stable with additional level.   Wynona Neat, PharmD, BCPS  07/17/2022,2:09 AM

## 2022-07-17 NOTE — Progress Notes (Signed)
EVENING ROUNDS NOTE :     [address]       [place] [address]             [phone]                 9 Days Post-Op Procedure(s) (LRB): BENTALL PROCEDURE USING RESILIA KONECT 29MM CONDUIT (N/A) CORONARY ARTERY BYPASS GRAFTING (CABG) X 1, USING ENDOSCOPICALLY HARVESTED RIGHT LEG GREATER SAPHENOUS VEIN (N/A) TRANSESOPHAGEAL ECHOCARDIOGRAM (TEE) (N/A) EPICARDIAL PACING LEAD PLACEMENT (N/A)   Total Length of Stay:  LOS: 9 days  Events:   No events Off neo Up to chair    BP (!) 89/73   Pulse 86   Temp 97.8 F (36.6 C) (Oral)   Resp 12   Ht '5\' 7"'$  (1.702 m)   Wt 75.3 kg   SpO2 96%   BMI 26.00 kg/m   CVP:  [4 mmHg-11 mmHg] 10 mmHg      sodium chloride Stopped (07/09/22 1410)   sodium chloride     sodium chloride 10 mL/hr at 07/13/22 0800   sodium chloride     amiodarone 30 mg/hr (07/17/22 1500)   heparin 1,100 Units/hr (07/17/22 1500)   lactated ringers     lactated ringers Stopped (07/11/22 1157)   norepinephrine (LEVOPHED) Adult infusion Stopped (07/17/22 1016)   promethazine (PHENERGAN) injection (IM or IVPB)      I/O last 3 completed shifts: In: 1473.5 [I.V.:1191.5; IV Piggyback:282] Out: 3244 [Urine:3440]      Latest Ref Rng & Units 07/17/2022    5:11 AM 07/16/2022    7:11 AM 07/16/2022    4:11 AM  CBC  WBC 4.0 - 10.5 K/uL 10.1   10.3   Hemoglobin 13.0 - 17.0 g/dL 9.0  9.2  8.9   Hematocrit 39.0 - 52.0 % 25.2  27.0  25.3   Platelets 150 - 400 K/uL 288   341        Latest Ref Rng & Units 07/17/2022    5:11 AM 07/16/2022    7:11 AM 07/16/2022    4:11 AM  BMP  Glucose 70 - 99 mg/dL 125  125  122   BUN 8 - 23 mg/dL '16  20  23   '$ Creatinine 0.61 - 1.24 mg/dL 0.91  1.00  1.12   Sodium 135 - 145 mmol/L 130  128  127   Potassium 3.5 - 5.1 mmol/L 3.3  3.5  3.5   Chloride 98 - 111 mmol/L 91  90  89   CO2 22 - 32 mmol/L 28   28   Calcium 8.9 - 10.3 mg/dL 8.4   7.8     ABG    Component Value Date/Time   PHART 7.342 (L) 07/10/2022  0546   PCO2ART 43.3 07/10/2022 0546   PO2ART 71 (L) 07/10/2022 0546   HCO3 23.4 07/10/2022 0546   TCO2 30 07/16/2022 0711   ACIDBASEDEF 2.0 07/10/2022 0546   O2SAT 83.4 07/17/2022 0512       Melodie Bouillon, MD 07/17/2022 3:50 PM

## 2022-07-17 NOTE — Progress Notes (Addendum)
Patient ID: Joe [name], male   DOB: 17-Mar-1956, 66 y.o.   MRN: [id number]     Advanced Heart Failure Rounding Note  PCP-Cardiologist: Skeet Latch, MD   Subjective:    10/04: Hypotensive. NE added back. Foley placed for urinary retention. Received 1 u RBCs. Bedside echo: LVEF 20-25%, RV severely reduced, AVR okay, no pericardial effusion  Yesterday started on midodrine 10 mg three times a day. Over night norepi was weaned to 3 mcg.   CO-OX 83%.   Denies SOB.    Objective:   Weight Range: 75.3 kg Body mass index is 26 kg/m.   Vital Signs:   Temp:  [97.8 F (36.6 C)-98.5 F (36.9 C)] 97.8 F (36.6 C) (10/06 0821) Pulse Rate:  [63-143] 97 (10/06 0800) Resp:  [12-26] 20 (10/06 0800) BP: (76-120)/(57-92) 111/92 (10/06 0800) SpO2:  [86 %-97 %] 96 % (10/06 0800) Weight:  [75.3 kg] 75.3 kg (10/06 0600) Last BM Date : 07/14/22  Weight change: Filed Weights   07/15/22 1000 07/16/22 0600 07/17/22 0600  Weight: 67.2 kg 76.2 kg 75.3 kg    Intake/Output:   Intake/Output Summary (Last 24 hours) at 07/17/2022 1007 Last data filed at 07/17/2022 1000 Gross per 24 hour  Intake 937.21 ml  Output 1860 ml  Net -922.79 ml      Physical Exam  General:  In bed. No resp difficulty HEENT: normal Neck: supple. no JVD. Carotids 2+ bilat; no bruits. No lymphadenopathy or thryomegaly appreciated. RIJ introducer.  Cor: PMI nondisplaced. Irregular rate & rhythm. No rubs, gallops or murmurs. Lungs: clear Abdomen: soft, nontender, nondistended. No hepatosplenomegaly. No bruits or masses. Good bowel sounds. Extremities: no cyanosis, clubbing, rash, edema Neuro: alert & orientedx3, cranial nerves grossly intact. moves all 4 extremities w/o difficulty. Affect pleasant  Telemetry   A flutter 60-90s   EKG    N/A  Labs    CBC Recent Labs    07/16/22 0411 07/16/22 0711 07/17/22 0511  WBC 10.3  --  10.1  HGB 8.9* 9.2* 9.0*  HCT 25.3* 27.0* 25.2*  MCV 97.7  --  96.9  PLT  341  --  373   Basic Metabolic Panel Recent Labs    07/15/22 0408 07/15/22 1604 07/16/22 0411 07/16/22 0711 07/17/22 0511  NA 127*   < > 127* 128* 130*  K 3.4*   < > 3.5 3.5 3.3*  CL 89*   < > 89* 90* 91*  CO2 26   < > 28  --  28  GLUCOSE 116*   < > 122* 125* 125*  BUN 24*   < > '23 20 16  '$ CREATININE 1.24   < > 1.12 1.00 0.91  CALCIUM 8.0*   < > 7.8*  --  8.4*  MG 2.3  --   --   --   --    < > = values in this interval not displayed.   Liver Function Tests Recent Labs    07/15/22 1604  AST 32  ALT 25  ALKPHOS 48  BILITOT 1.0  PROT 5.5*  ALBUMIN 2.5*    No results for input(s): "LIPASE", "AMYLASE" in the last 72 hours. Cardiac Enzymes No results for input(s): "CKTOTAL", "CKMB", "CKMBINDEX", "TROPONINI" in the last 72 hours.  BNP: BNP (last 3 results) Recent Labs    06/25/22 1829  BNP 436.2*    ProBNP (last 3 results) No results for input(s): "PROBNP" in the last 8760 hours.   D-Dimer No results for input(s): "DDIMER" in  the last 72 hours. Hemoglobin A1C No results for input(s): "HGBA1C" in the last 72 hours. Fasting Lipid Panel No results for input(s): "CHOL", "HDL", "LDLCALC", "TRIG", "CHOLHDL", "LDLDIRECT" in the last 72 hours. Thyroid Function Tests No results for input(s): "TSH", "T4TOTAL", "T3FREE", "THYROIDAB" in the last 72 hours.  Invalid input(s): "FREET3"  Other results:   Imaging    No results found.   Medications:     Scheduled Medications:  aspirin EC  81 mg Oral Daily   atorvastatin  80 mg Oral Daily   Chlorhexidine Gluconate Cloth  6 each Topical Daily   metoCLOPramide (REGLAN) injection  10 mg Intravenous Q6H   midodrine  10 mg Oral TID WC   pantoprazole  40 mg Oral Daily   sodium chloride flush  10-40 mL Intracatheter Q12H   sodium chloride flush  3 mL Intravenous Q12H   sodium chloride flush  3 mL Intravenous Q12H   tamsulosin  0.4 mg Oral BID    Infusions:  sodium chloride Stopped (07/09/22 1410)   sodium chloride      sodium chloride 10 mL/hr at 07/13/22 0800   sodium chloride     amiodarone 30 mg/hr (07/17/22 1000)   heparin 1,100 Units/hr (07/17/22 1000)   lactated ringers     lactated ringers Stopped (07/11/22 1157)   norepinephrine (LEVOPHED) Adult infusion 3 mcg/min (07/17/22 1000)   potassium chloride 50 mL/hr at 07/17/22 1000   promethazine (PHENERGAN) injection (IM or IVPB)      PRN Medications: sodium chloride, sodium chloride, hydrocortisone, hydrocortisone, melatonin, morphine injection, ondansetron (ZOFRAN) IV, mouth rinse, oxyCODONE, promethazine (PHENERGAN) injection (IM or IVPB), sodium chloride flush, sodium chloride flush, sodium chloride flush, traMADol    Patient Profile   66 y/o male recently found to have severe LV dysfunction in setting of markedly dilated aortic root (67 mm) and severe AI (tricuspid AoV) and single vessel CAD, now s/p Bentall and CABG x 1 w/ SVG-RCA. AHF team consulted to assist w/ post-operative management of systolic heart failure.     Assessment/Plan   Ascending Aortic Aneurysm/ Severe AI  - CTA  w/ severe dilation at the sinus of Valsalva, 67 mm. Moderate dilation of mid ascending aorta, 44 mm. Tricuspid AoV - s/p Bentall with bioprosthetic aortic valve 9/27 - intraoperative TEE, no perivalvular leak post repair    2. CAD - single vessel, 60-70% pRCA  - s/p CABG x 1, SVG- RCA  - ASA + statin  - hold initiation of ? blocker for now.    3. Chronic Systolic Heart Failure  - newly diagnosed; Echo 9/23 EF 25-30%, LV severely dilated, severe AI, Global HK, RV ok  - NICM, severity out of proportion to degree of CAD  - likely valvular CM in setting of severe AI +/- LBBB CM  - now post op bioprosthetic AVR + CABG. Intraoperative TEE LVEF 20-25%, RV ok - With junctional rhythm, will hold off on beta blocker and digoxin for now to avoid worsening A-V conduction. Eventual CRT-D (see below).  - Hypotensive 10/04 in setting of urinary retention. Bedside  echo with no evidence of pericardial effusion. EF < 20%, RV depressed, AVR okay. - GDMT limited. On midodrine 10 mg three times a day.  - Continue spiro 12.5 daily - Eventual SGLT2i  4. Junctional rhythm - Accelerated junctional rhythm in 70s with LBBB.  Confirm with ECG.  - Has epicardial LV lead. EP following. Considering CRT-D prior to discharge. See below.   5. LBBB -Will eventually need  epicardial lead hooked up for CRT but if needs DC-CV first may have to postpone CRT to outpatient.   6. Anemia - Post-op, 1 u RBCs 10/04. Hgb 9  today, transfuse < 7.5.   7. Thrombocytopenia - Resolved.  8. Atrial arrhythmias - Intermittent AF post-op.  - Now looks like atrial flutter vs atrial tach with variable conduction - Unsuccessful attempt to pace out AFL  - May need DC-CV prior to discharge - Continue amiodarone gtt 30 mg/hr - Continue heparin gtt - Will need cardioversion.   9. Urinary retention - Placed foley - Started Flomax - UA okay  Ambulate.    Length of Stay: [place], NP  10:07 AM   Patient seen with NP agree with the above note.   Subjective: Complaining about abdominal discomfort.  Still has not had a bowel movement.   Exam: General: White male sitting comfortably in bed no apparent distress. HEENT: Normal.  Neck: JVP 7  Lungs: CTA bilaterally CV: irregular; tachycardic Abdomen: Soft, nontender, no hepatosplenomegaly, no distention.  Skin: Intact without lesions or rashes.  Neurologic: no FND Psych: Normal affect. Extremities: No clubbing or cyanosis.    A/P 66 YO w/ HFrEF (LVEF 25%-30%), severe AI, dilated aortic root now s/p single vessel CABG (SVG-RCA) & Bentall now POD#9 with improving levophed requirement, CVP at goal. Lactulose today for constipation. Improvement in mixed venous. Rate controlled on my exam. Will plan for cardioversion next week.   Joe [name] 2:23 PM  CRITICAL CARE Performed by: Hebert Soho   Total  critical care time: 30 minutes  Critical care time was exclusive of separately billable procedures and treating other patients.  Critical care was necessary to treat or prevent imminent or life-threatening deterioration.  Critical care was time spent personally by me on the following activities: development of treatment plan with patient and/or surrogate as well as nursing, discussions with consultants, evaluation of patient's response to treatment, examination of patient, obtaining history from patient or surrogate, ordering and performing treatments and interventions, ordering and review of laboratory studies, ordering and review of radiographic studies, pulse oximetry and re-evaluation of patient's condition.

## 2022-07-18 DIAGNOSIS — Z95828 Presence of other vascular implants and grafts: Secondary | ICD-10-CM | POA: Diagnosis not present

## 2022-07-18 DIAGNOSIS — I5032 Chronic diastolic (congestive) heart failure: Secondary | ICD-10-CM | POA: Diagnosis not present

## 2022-07-18 LAB — COOXEMETRY PANEL
Carboxyhemoglobin: 2.3 % — ABNORMAL HIGH (ref 0.5–1.5)
Methemoglobin: <0.7 % (ref 0.0–1.5)
O2 Saturation: 70.1 %
Total hemoglobin: 8.9 g/dL — ABNORMAL LOW (ref 12.0–16.0)

## 2022-07-18 LAB — BASIC METABOLIC PANEL
Anion gap: 11 (ref 5–15)
BUN: 20 mg/dL (ref 8–23)
CO2: 29 mmol/L (ref 22–32)
Calcium: 8.7 mg/dL — ABNORMAL LOW (ref 8.9–10.3)
Chloride: 91 mmol/L — ABNORMAL LOW (ref 98–111)
Creatinine, Ser: 0.93 mg/dL (ref 0.61–1.24)
GFR, Estimated: >60 mL/min (ref >60)
Glucose, Bld: 109 mg/dL — ABNORMAL HIGH (ref 70–99)
Potassium: 3.8 mmol/L (ref 3.5–5.1)
Sodium: 131 mmol/L — ABNORMAL LOW (ref 135–145)

## 2022-07-18 LAB — CBC
HCT: 24.8 % — ABNORMAL LOW (ref 39.0–52.0)
Hemoglobin: 8.7 g/dL — ABNORMAL LOW (ref 13.0–17.0)
MCH: 34.5 pg — ABNORMAL HIGH (ref 26.0–34.0)
MCHC: 35.1 g/dL (ref 30.0–36.0)
MCV: 98.4 fL (ref 80.0–100.0)
Platelets: 216 10*3/uL (ref 150–400)
RBC: 2.52 MIL/uL — ABNORMAL LOW (ref 4.22–5.81)
RDW: 14.6 % (ref 11.5–15.5)
WBC: 10.5 10*3/uL (ref 4.0–10.5)
nRBC: 0 % (ref 0.0–0.2)

## 2022-07-18 LAB — HEPARIN LEVEL (UNFRACTIONATED): Heparin Unfractionated: 0.48 IU/mL (ref 0.30–0.70)

## 2022-07-18 MED ORDER — SENNOSIDES-DOCUSATE SODIUM 8.6-50 MG PO TABS
1.0000 | ORAL_TABLET | Freq: Once | ORAL | Status: AC
  Administered 2022-07-18: 1 via ORAL
  Filled 2022-07-18: qty 1

## 2022-07-18 MED ORDER — FUROSEMIDE 40 MG PO TABS
40.0000 mg | ORAL_TABLET | Freq: Once | ORAL | Status: AC
  Administered 2022-07-18: 40 mg via ORAL
  Filled 2022-07-18: qty 1

## 2022-07-18 MED ORDER — BISACODYL 5 MG PO TBEC: 5.0000 mg | DELAYED_RELEASE_TABLET | Freq: Once | ORAL | Status: DC

## 2022-07-18 MED ORDER — BISACODYL 5 MG PO TBEC
5.0000 mg | DELAYED_RELEASE_TABLET | Freq: Once | ORAL | Status: AC
  Administered 2022-07-18: 5 mg via ORAL
  Filled 2022-07-18: qty 1

## 2022-07-18 NOTE — Progress Notes (Signed)
ANTICOAGULATION CONSULT NOTE  Pharmacy Consult for heparin Indication: atrial fibrillation  Allergies  Allergen Reactions   Codeine Nausea Only   Levaquin [Levofloxacin In D5w] Diarrhea    Patient Measurements: Height: '5\' 7"'$  (170.2 cm) Weight: 76.7 kg (169 lb 1.6 oz) IBW/kg (Calculated) : 66.1 Heparin Dosing Weight: 77kg  Vital Signs: Temp: 97.8 F (36.6 C) (10/07 0725) Temp Source: Oral (10/07 0725) BP: 95/73 (10/07 0600) Pulse Rate: 100 (10/07 0600)  Labs: Recent Labs    07/16/22 0411 07/16/22 0711 07/16/22 1116 07/17/22 0131 07/17/22 0511 07/17/22 0845 07/18/22 0427  HGB 8.9* 9.2*  --   --  9.0*  --  8.7*  HCT 25.3* 27.0*  --   --  25.2*  --  24.8*  PLT 341  --   --   --  288  --  216  HEPARINUNFRC 0.17*  --    < > 0.41  --  0.41 0.48  CREATININE 1.12 1.00  --   --  0.91  --  0.93   < > = values in this interval not displayed.     Estimated Creatinine Clearance: 71.1 mL/min (by C-G formula based on SCr of 0.93 mg/dL).   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 Pt has had ongoing AFL, pharmacy asked to start IV heparin. Given recent OHS will avoid  bolusing and target lower heparin level goal. CBC stable, no AC prior to admit.  Heparin level therapeutic at 0.48, CBC stable. Will aim to keep heparin level between 0.3 and 0.5 given recent sternotomy.  Goal of Therapy:  Heparin level 0.3-0.5 units/ml Monitor platelets by anticoagulation protocol: Yes   Plan:  Continue heparin 1100 units/h Daily heparin level and CBC Planning DCCV next week.   [place] Crane, Roylene Reason, BCCP Clinical Pharmacist  07/18/2022 7:40 AM   [facility] pharmacy phone numbers are listed on [email or web address]

## 2022-07-18 NOTE — Progress Notes (Signed)
      [address]       [place] [address]             [phone]                 10 Days Post-Op Procedure(s) (LRB): BENTALL PROCEDURE USING RESILIA KONECT 29MM CONDUIT (N/A) CORONARY ARTERY BYPASS GRAFTING (CABG) X 1, USING ENDOSCOPICALLY HARVESTED RIGHT LEG GREATER SAPHENOUS VEIN (N/A) TRANSESOPHAGEAL ECHOCARDIOGRAM (TEE) (N/A) EPICARDIAL PACING LEAD PLACEMENT (N/A)   Events: No events overnight _______________________________________________________________ Vitals: BP 95/73   Pulse 100   Temp 97.8 F (36.6 C) (Oral)   Resp 11   Ht '5\' 7"'$  (1.702 m)   Wt 76.7 kg   SpO2 93%   BMI 26.48 kg/m  Filed Weights   07/16/22 0600 07/17/22 0600 07/18/22 0500  Weight: 76.2 kg 75.3 kg 76.7 kg     - Neuro: arousable  - Cardiovascular: afib  Drips: hep, and amio 30.   CVP:  [10 mmHg-13 mmHg] 11 mmHg  - Pulm: EWOB    ABG    Component Value Date/Time   PHART 7.342 (L) 07/10/2022 0546   PCO2ART 43.3 07/10/2022 0546   PO2ART 71 (L) 07/10/2022 0546   HCO3 23.4 07/10/2022 0546   TCO2 30 07/16/2022 0711   ACIDBASEDEF 2.0 07/10/2022 0546   O2SAT 70.1 07/18/2022 0430    - Abd: ND - Extremity: trace edema  .Intake/Output      10/06 0701 10/07 0700 10/07 0701 10/08 0700   I.V. (mL/kg) 678.7 (8.8)    IV Piggyback 300.3    Total Intake(mL/kg) 979 (12.8)    Urine (mL/kg/hr) 740 (0.4)    Total Output 740    Net +239            _______________________________________________________________ Labs:    Latest Ref Rng & Units 07/18/2022    4:27 AM 07/17/2022    5:11 AM 07/16/2022    7:11 AM  CBC  WBC 4.0 - 10.5 K/uL 10.5  10.1    Hemoglobin 13.0 - 17.0 g/dL 8.7  9.0  9.2   Hematocrit 39.0 - 52.0 % 24.8  25.2  27.0   Platelets 150 - 400 K/uL 216  288        Latest Ref Rng & Units 07/18/2022    4:27 AM 07/17/2022    5:11 AM 07/16/2022    7:11 AM  CMP  Glucose 70 - 99 mg/dL 109  125  125   BUN 8 - 23 mg/dL '20  16  20   '$ Creatinine 0.61 - 1.24 mg/dL  0.93  0.91  1.00   Sodium 135 - 145 mmol/L 131  130  128   Potassium 3.5 - 5.1 mmol/L 3.8  3.3  3.5   Chloride 98 - 111 mmol/L 91  91  90   CO2 22 - 32 mmol/L 29  28    Calcium 8.9 - 10.3 mg/dL 8.7  8.4      CXR: PV congestion  _______________________________________________________________  Assessment and Plan: POD 10 s/p bental, CABG 1  Neuro: pain controlled.  CV: afib.  Plan for cardioversion next week.  Continue hep, and amio.  Will keep pacing wires Pulm: IS, ambulation Renal: creat stable GI: on diet Heme: stable ID: [email or web address] Endo: SSI Dispo: continue ICU care   Lajuana Matte 07/18/2022 8:57 AM

## 2022-07-18 NOTE — Progress Notes (Signed)
Patient ID: Joe [name], male   DOB: May 26, 1956, 66 y.o.   MRN: [id number]     Advanced Heart Failure Rounding Note  PCP-Cardiologist: Skeet Latch, MD   Subjective:    - Complaining of abdominal discomfort and nausea; no bowel movements since OR.  - CVP slightly higher at 11 today.    Objective:   Weight Range: 76.7 kg Body mass index is 26.48 kg/m.   Vital Signs:   Temp:  [97.4 F (36.3 C)-97.8 F (36.6 C)] 97.8 F (36.6 C) (10/07 0725) Pulse Rate:  [48-197] 96 (10/07 1000) Resp:  [5-25] 11 (10/07 1000) BP: (84-136)/(64-120) 97/72 (10/07 1000) SpO2:  [91 %-98 %] 96 % (10/07 1000) Weight:  [76.7 kg] 76.7 kg (10/07 0500) Last BM Date : 07/14/22  Weight change: Filed Weights   07/16/22 0600 07/17/22 0600 07/18/22 0500  Weight: 76.2 kg 75.3 kg 76.7 kg    Intake/Output:   Intake/Output Summary (Last 24 hours) at 07/18/2022 1042 Last data filed at 07/18/2022 1000 Gross per 24 hour  Intake 895.47 ml  Output 655 ml  Net 240.47 ml       Physical Exam  General:  In bed. No resp difficulty HEENT: normal Neck: supple. no JVD. Carotids 2+ bilat; no bruits. No lymphadenopathy or thryomegaly appreciated. RIJ introducer.  Cor: PMI nondisplaced. Irregular; JVP elevated 9-10.  Lungs: cta/bl Abdomen: soft, nontender, nondistended. No hepatosplenomegaly. No bruits or masses. Good bowel sounds. Extremities: no cyanosis, clubbing, rash, edema Neuro: alert & orientedx3, cranial nerves grossly intact. moves all 4 extremities w/o difficulty. Affect pleasant  Telemetry  AF, irregular  EKG    N/A  Labs    CBC Recent Labs    07/17/22 0511 07/18/22 0427  WBC 10.1 10.5  HGB 9.0* 8.7*  HCT 25.2* 24.8*  MCV 96.9 98.4  PLT 288 627    Basic Metabolic Panel Recent Labs    07/17/22 0511 07/18/22 0427  NA 130* 131*  K 3.3* 3.8  CL 91* 91*  CO2 28 29  GLUCOSE 125* 109*  BUN 16 20  CREATININE 0.91 0.93  CALCIUM 8.4* 8.7*    Liver Function Tests Recent  Labs    07/15/22 1604  AST 32  ALT 25  ALKPHOS 48  BILITOT 1.0  PROT 5.5*  ALBUMIN 2.5*    BNP: BNP (last 3 results) Recent Labs    06/25/22 1829  BNP 436.2*      Imaging  reviewed  Medications:     Scheduled Medications:  aspirin EC  81 mg Oral Daily   atorvastatin  80 mg Oral Daily   bisacodyl  5 mg Oral Once   Chlorhexidine Gluconate Cloth  6 each Topical Daily   furosemide  40 mg Oral Once   metoCLOPramide (REGLAN) injection  10 mg Intravenous Q6H   midodrine  10 mg Oral TID WC   pantoprazole  40 mg Oral Daily   polyethylene glycol  17 g Oral Daily   sodium chloride flush  10-40 mL Intracatheter Q12H   sodium chloride flush  3 mL Intravenous Q12H   sodium chloride flush  3 mL Intravenous Q12H   tamsulosin  0.4 mg Oral BID    Infusions:  sodium chloride Stopped (07/09/22 1410)   sodium chloride     sodium chloride 10 mL/hr at 07/17/22 2143   sodium chloride     amiodarone 30 mg/hr (07/18/22 1000)   heparin 1,100 Units/hr (07/18/22 1000)   lactated ringers     lactated ringers  Stopped (07/11/22 1157)   norepinephrine (LEVOPHED) Adult infusion Stopped (07/17/22 1016)   promethazine (PHENERGAN) injection (IM or IVPB)      PRN Medications: sodium chloride, sodium chloride, hydrocortisone, hydrocortisone, melatonin, morphine injection, ondansetron (ZOFRAN) IV, mouth rinse, oxyCODONE, promethazine (PHENERGAN) injection (IM or IVPB), sodium chloride flush, sodium chloride flush, sodium chloride flush, traMADol    Patient Profile   66 y/o male recently found to have severe LV dysfunction in setting of markedly dilated aortic root (67 mm) and severe AI (tricuspid AoV) and single vessel CAD, now s/p Bentall and CABG x 1 w/ SVG-RCA. AHF team consulted to assist w/ post-operative management of systolic heart failure.     Assessment/Plan   Ascending Aortic Aneurysm/ Severe AI  - CTA  w/ severe dilation at the sinus of Valsalva, 67 mm. Moderate dilation of  mid ascending aorta, 44 mm. Tricuspid AoV - s/p Bentall with bioprosthetic aortic valve 9/27 - intraoperative TEE, no perivalvular leak post repair  - significant post-op ileus, dulcolex today   2. CAD - single vessel, 60-70% pRCA  - s/p CABG x 1, SVG- RCA  - ASA + statin  - hold initiation of ? blocker for now.    3. Chronic Systolic Heart Failure  - newly diagnosed; Echo 9/23 EF 25-30%, LV severely dilated, severe AI, Global HK, RV ok  - NICM, severity out of proportion to degree of CAD  - likely valvular CM in setting of severe AI +/- LBBB CM  - now post op bioprosthetic AVR + CABG. Intraoperative TEE LVEF 20-25%, RV ok - With junctional rhythm, will hold off on beta blocker and digoxin for now to avoid worsening A-V conduction. Eventual CRT-D (see below).  - Hypotensive 10/04 in setting of urinary retention. Bedside echo with no evidence of pericardial effusion. EF < 20%, RV depressed, AVR okay. - GDMT limited. On midodrine 10 mg three times a day.  - Continue spiro 12.5 daily - CVP 11 today, lasix '40mg'$  PO x1. Plan to start SGLT2i once he is tolerating a PO diet.   4. Junctional rhythm - Accelerated junctional rhythm in 70s with LBBB.  Confirm with ECG.  - Has epicardial LV lead. EP following. Considering CRT-D prior to discharge. See below.   5. LBBB -Will eventually need epicardial lead hooked up for CRT but if needs DC-CV first may have to postpone CRT to outpatient.   6. Anemia - Post-op, 1 u RBCs 10/04. Hgb 9  today, transfuse < 7.5.   7. Thrombocytopenia - Resolved.  8. Atrial arrhythmias - Continue amio, plan for DCCV early next week.   9. Urinary retention - Placed foley - continue Flomax - UA okay  Length of Stay: [place], DO  10:42 AM  CRITICAL CARE Performed by: Hebert Soho   Total critical care time: 35 minutes  Critical care time was exclusive of separately billable procedures and treating other patients.  Critical care was  necessary to treat or prevent imminent or life-threatening deterioration.  Critical care was time spent personally by me on the following activities: development of treatment plan with patient and/or surrogate as well as nursing, discussions with consultants, evaluation of patient's response to treatment, examination of patient, obtaining history from patient or surrogate, ordering and performing treatments and interventions, ordering and review of laboratory studies, ordering and review of radiographic studies, pulse oximetry and re-evaluation of patient's condition.

## 2022-07-19 ENCOUNTER — Inpatient Hospital Stay (HOSPITAL_COMMUNITY): Payer: Medicare Other

## 2022-07-19 ENCOUNTER — Inpatient Hospital Stay: Payer: Self-pay

## 2022-07-19 DIAGNOSIS — I5022 Chronic systolic (congestive) heart failure: Secondary | ICD-10-CM | POA: Diagnosis not present

## 2022-07-19 DIAGNOSIS — I719 Aortic aneurysm of unspecified site, without rupture: Secondary | ICD-10-CM | POA: Diagnosis not present

## 2022-07-19 DIAGNOSIS — Z95828 Presence of other vascular implants and grafts: Secondary | ICD-10-CM | POA: Diagnosis not present

## 2022-07-19 LAB — TYPE AND SCREEN
ABO/RH(D): A POS
Antibody Screen: POSITIVE
Donor AG Type: NEGATIVE
Donor AG Type: NEGATIVE
Donor AG Type: NEGATIVE
Donor AG Type: NEGATIVE
PT AG Type: NEGATIVE
Unit division: 0
Unit division: 0
Unit division: 0
Unit division: 0

## 2022-07-19 LAB — COOXEMETRY PANEL
Carboxyhemoglobin: 1.9 % — ABNORMAL HIGH (ref 0.5–1.5)
Methemoglobin: <0.7 % (ref 0.0–1.5)
O2 Saturation: 71.8 %
Total hemoglobin: 8.5 g/dL — ABNORMAL LOW (ref 12.0–16.0)

## 2022-07-19 LAB — CBC
HCT: 24.9 % — ABNORMAL LOW (ref 39.0–52.0)
Hemoglobin: 8.4 g/dL — ABNORMAL LOW (ref 13.0–17.0)
MCH: 33.5 pg (ref 26.0–34.0)
MCHC: 33.7 g/dL (ref 30.0–36.0)
MCV: 99.2 fL (ref 80.0–100.0)
Platelets: 188 10*3/uL (ref 150–400)
RBC: 2.51 MIL/uL — ABNORMAL LOW (ref 4.22–5.81)
RDW: 14.6 % (ref 11.5–15.5)
WBC: 10.4 10*3/uL (ref 4.0–10.5)
nRBC: 0 % (ref 0.0–0.2)

## 2022-07-19 LAB — BPAM RBC
Blood Product Expiration Date: 202310232359
Blood Product Expiration Date: 202310242359
Blood Product Expiration Date: 202310262359
Blood Product Expiration Date: 202310262359
ISSUE DATE / TIME: 202310041317
Unit Type and Rh: 5100
Unit Type and Rh: 5100
Unit Type and Rh: 5100
Unit Type and Rh: 5100

## 2022-07-19 LAB — BASIC METABOLIC PANEL
Anion gap: 11 (ref 5–15)
BUN: 16 mg/dL (ref 8–23)
CO2: 29 mmol/L (ref 22–32)
Calcium: 8.2 mg/dL — ABNORMAL LOW (ref 8.9–10.3)
Chloride: 86 mmol/L — ABNORMAL LOW (ref 98–111)
Creatinine, Ser: 0.86 mg/dL (ref 0.61–1.24)
GFR, Estimated: >60 mL/min (ref >60)
Glucose, Bld: 214 mg/dL — ABNORMAL HIGH (ref 70–99)
Potassium: 3.2 mmol/L — ABNORMAL LOW (ref 3.5–5.1)
Sodium: 126 mmol/L — ABNORMAL LOW (ref 135–145)

## 2022-07-19 LAB — HEPARIN LEVEL (UNFRACTIONATED): Heparin Unfractionated: 0.47 IU/mL (ref 0.30–0.70)

## 2022-07-19 MED ORDER — SODIUM CHLORIDE 0.9% FLUSH
10.0000 mL | INTRAVENOUS | Status: DC | PRN
  Administered 2022-08-05: 10 mL

## 2022-07-19 MED ORDER — METHYLNALTREXONE BROMIDE 12 MG/0.6ML ~~LOC~~ SOLN
12.0000 mg | Freq: Once | SUBCUTANEOUS | Status: AC
  Administered 2022-07-19: 12 mg via SUBCUTANEOUS
  Filled 2022-07-19: qty 0.6

## 2022-07-19 MED ORDER — POTASSIUM CHLORIDE 10 MEQ/50ML IV SOLN
10.0000 meq | INTRAVENOUS | Status: AC
  Administered 2022-07-19 (×3): 10 meq via INTRAVENOUS
  Filled 2022-07-19 (×3): qty 50

## 2022-07-19 MED ORDER — SORBITOL 70 % SOLN
30.0000 mL | Freq: Once | Status: AC
  Administered 2022-07-19: 30 mL via ORAL
  Filled 2022-07-19: qty 30

## 2022-07-19 MED ORDER — SODIUM CHLORIDE 0.9% FLUSH
10.0000 mL | Freq: Two times a day (BID) | INTRAVENOUS | Status: DC
  Administered 2022-07-20 – 2022-07-23 (×8): 10 mL
  Administered 2022-07-24: 20 mL
  Administered 2022-07-24 – 2022-08-04 (×21): 10 mL
  Administered 2022-08-04: 20 mL
  Administered 2022-08-05: 10 mL
  Administered 2022-08-05: 20 mL
  Administered 2022-08-06 – 2022-08-08 (×5): 10 mL
  Administered 2022-08-08: 30 mL
  Administered 2022-08-09: 20 mL
  Administered 2022-08-09 – 2022-08-10 (×3): 10 mL
  Administered 2022-08-11: 20 mL
  Administered 2022-08-11: 10 mL
  Administered 2022-08-12 – 2022-08-13 (×3): 20 mL

## 2022-07-19 MED ORDER — POTASSIUM CHLORIDE CRYS ER 20 MEQ PO TBCR
40.0000 meq | EXTENDED_RELEASE_TABLET | Freq: Once | ORAL | Status: DC
  Filled 2022-07-19: qty 2

## 2022-07-19 NOTE — Progress Notes (Signed)
ANTICOAGULATION CONSULT NOTE  Pharmacy Consult for heparin Indication: atrial fibrillation  Allergies  Allergen Reactions   Codeine Nausea Only   Levaquin [Levofloxacin In D5w] Diarrhea    Patient Measurements: Height: '5\' 7"'$  (170.2 cm) Weight: 76.4 kg (168 lb 6.9 oz) IBW/kg (Calculated) : 66.1 Heparin Dosing Weight: 77kg  Vital Signs: Temp: 97.7 F (36.5 C) (10/08 0800) Temp Source: Oral (10/08 0800) BP: 104/68 (10/08 1500) Pulse Rate: 86 (10/08 1500)  Labs: Recent Labs    07/17/22 0511 07/17/22 0845 07/18/22 0427 07/19/22 0350  HGB 9.0*  --  8.7* 8.4*  HCT 25.2*  --  24.8* 24.9*  PLT 288  --  216 188  HEPARINUNFRC  --  0.41 0.48 0.47  CREATININE 0.91  --  0.93 0.86     Estimated Creatinine Clearance: 76.9 mL/min (by C-G formula based on SCr of 0.86 mg/dL).   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 Pt has had ongoing AFL, pharmacy asked to start IV heparin. Given recent OHS will avoid bolusing and target lower heparin level goal. CBC stable, no AC prior to admit.  Heparin level therapeutic at 0.47, CBC stable. Will aim to keep heparin level between 0.3 and  0.5 given recent sternotomy.  Goal of Therapy:  Heparin level 0.3-0.5 units/ml Monitor platelets by anticoagulation protocol: Yes   Plan:  Continue heparin 1100 units/h Daily heparin level and CBC Planning DCCV next week.   [place] Crane, Roylene Reason, BCCP Clinical Pharmacist  07/19/2022 3:46 PM   [facility] pharmacy phone numbers are listed on [email or web address]

## 2022-07-19 NOTE — Progress Notes (Signed)
EVENING ROUNDS NOTE :     [address]       [place] [address]             [phone]                 11 Days Post-Op Procedure(s) (LRB): BENTALL PROCEDURE USING RESILIA KONECT 29MM CONDUIT (N/A) CORONARY ARTERY BYPASS GRAFTING (CABG) X 1, USING ENDOSCOPICALLY HARVESTED RIGHT LEG GREATER SAPHENOUS VEIN (N/A) TRANSESOPHAGEAL ECHOCARDIOGRAM (TEE) (N/A) EPICARDIAL PACING LEAD PLACEMENT (N/A)   Total Length of Stay:  LOS: 11 days  Events:   No events     BP 127/78 (BP Location: Left Arm)   Pulse 90   Temp (!) 97.3 F (36.3 C) (Oral)   Resp 17   Ht '5\' 7"'$  (1.702 m)   Wt 76.4 kg   SpO2 95%   BMI 26.38 kg/m   CVP:  [8 mmHg-28 mmHg] 12 mmHg      sodium chloride Stopped (07/09/22 1410)   sodium chloride     sodium chloride 10 mL/hr at 07/17/22 2143   sodium chloride     amiodarone 30 mg/hr (07/19/22 2000)   heparin Stopped (07/19/22 1948)   lactated ringers     lactated ringers Stopped (07/11/22 1157)   norepinephrine (LEVOPHED) Adult infusion Stopped (07/17/22 1016)   promethazine (PHENERGAN) injection (IM or IVPB)      I/O last 3 completed shifts: In: 2114.8 [P.O.:460; I.V.:1474.9; IV Piggyback:180] Out: 1290 [Urine:1290]      Latest Ref Rng & Units 07/19/2022    3:50 AM 07/18/2022    4:27 AM 07/17/2022    5:11 AM  CBC  WBC 4.0 - 10.5 K/uL 10.4  10.5  10.1   Hemoglobin 13.0 - 17.0 g/dL 8.4  8.7  9.0   Hematocrit 39.0 - 52.0 % 24.9  24.8  25.2   Platelets 150 - 400 K/uL 188  216  288        Latest Ref Rng & Units 07/19/2022    3:50 AM 07/18/2022    4:27 AM 07/17/2022    5:11 AM  BMP  Glucose 70 - 99 mg/dL 214  109  125   BUN 8 - 23 mg/dL '16  20  16   '$ Creatinine 0.61 - 1.24 mg/dL 0.86  0.93  0.91   Sodium 135 - 145 mmol/L 126  131  130   Potassium 3.5 - 5.1 mmol/L 3.2  3.8  3.3   Chloride 98 - 111 mmol/L 86  91  91   CO2 22 - 32 mmol/L '29  29  28   '$ Calcium 8.9 - 10.3 mg/dL 8.2  8.7  8.4     ABG    Component Value Date/Time    PHART 7.342 (L) 07/10/2022 0546   PCO2ART 43.3 07/10/2022 0546   PO2ART 71 (L) 07/10/2022 0546   HCO3 23.4 07/10/2022 0546   TCO2 30 07/16/2022 0711   ACIDBASEDEF 2.0 07/10/2022 0546   O2SAT 71.8 07/19/2022 0350       Melodie Bouillon, MD 07/19/2022 8:14 PM

## 2022-07-19 NOTE — Progress Notes (Signed)
      [address]       [place] [address]             [phone]                 11 Days Post-Op Procedure(s) (LRB): BENTALL PROCEDURE USING RESILIA KONECT 29MM CONDUIT (N/A) CORONARY ARTERY BYPASS GRAFTING (CABG) X 1, USING ENDOSCOPICALLY HARVESTED RIGHT LEG GREATER SAPHENOUS VEIN (N/A) TRANSESOPHAGEAL ECHOCARDIOGRAM (TEE) (N/A) EPICARDIAL PACING LEAD PLACEMENT (N/A)   Events: No events overnight Feels better today _______________________________________________________________ Vitals: BP 93/74   Pulse 81   Temp 97.7 F (36.5 C) (Oral)   Resp (!) 9   Ht '5\' 7"'$  (1.702 m)   Wt 76.4 kg   SpO2 99%   BMI 26.38 kg/m  Filed Weights   07/17/22 0600 07/18/22 0500 07/19/22 0500  Weight: 75.3 kg 76.7 kg 76.4 kg     - Neuro: arousable  - Cardiovascular: afib  Drips: hep, and amio 30.   CVP:  [8 mmHg-28 mmHg] 11 mmHg  - Pulm: EWOB    ABG    Component Value Date/Time   PHART 7.342 (L) 07/10/2022 0546   PCO2ART 43.3 07/10/2022 0546   PO2ART 71 (L) 07/10/2022 0546   HCO3 23.4 07/10/2022 0546   TCO2 30 07/16/2022 0711   ACIDBASEDEF 2.0 07/10/2022 0546   O2SAT 71.8 07/19/2022 0350    - Abd: ND - Extremity: trace edema  .Intake/Output      10/07 0701 10/08 0700 10/08 0701 10/09 0700   P.O. 460    I.V. (mL/kg) 691.2 (9) 562.9 (7.4)   IV Piggyback 13.6 108.3   Total Intake(mL/kg) 1164.8 (15.2) 671.3 (8.8)   Urine (mL/kg/hr) 945 (0.5) 100 (0.4)   Total Output 945 100   Net +219.8 +571.3           _______________________________________________________________ Labs:    Latest Ref Rng & Units 07/19/2022    3:50 AM 07/18/2022    4:27 AM 07/17/2022    5:11 AM  CBC  WBC 4.0 - 10.5 K/uL 10.4  10.5  10.1   Hemoglobin 13.0 - 17.0 g/dL 8.4  8.7  9.0   Hematocrit 39.0 - 52.0 % 24.9  24.8  25.2   Platelets 150 - 400 K/uL 188  216  288       Latest Ref Rng & Units 07/19/2022    3:50 AM 07/18/2022    4:27 AM 07/17/2022    5:11 AM  CMP  Glucose  70 - 99 mg/dL 214  109  125   BUN 8 - 23 mg/dL '16  20  16   '$ Creatinine 0.61 - 1.24 mg/dL 0.86  0.93  0.91   Sodium 135 - 145 mmol/L 126  131  130   Potassium 3.5 - 5.1 mmol/L 3.2  3.8  3.3   Chloride 98 - 111 mmol/L 86  91  91   CO2 22 - 32 mmol/L '29  29  28   '$ Calcium 8.9 - 10.3 mg/dL 8.2  8.7  8.4     CXR: PV congestion  _______________________________________________________________  Assessment and Plan: POD 11 s/p bental, CABG 1  Neuro: pain controlled.  CV: afib.  Plan for cardioversion next week.  Continue hep, and amio.  Will keep pacing wires Pulm: IS, ambulation Renal: creat stable GI: on diet Heme: stable ID: afebrile Endo: SSI Dispo: continue ICU care   Lajuana Matte 07/19/2022 10:34 AM

## 2022-07-19 NOTE — Progress Notes (Signed)
Peripherally Inserted Central Catheter Placement  The IV Nurse has discussed with the patient and/or persons authorized to consent for the patient, the purpose of this procedure and the potential benefits and risks involved with this procedure.  The benefits include less needle sticks, lab draws from the catheter, and the patient may be discharged home with the catheter. Risks include, but not limited to, infection, bleeding, blood clot (thrombus formation), and puncture of an artery; nerve damage and irregular heartbeat and possibility to perform a PICC exchange if needed/ordered by physician.  Alternatives to this procedure were also discussed.  Bard Power PICC patient education guide, fact sheet on infection prevention and patient information card has been provided to patient /or left at bedside.    PICC Placement Documentation  PICC Double Lumen 84/16/60 Right Basilic 39 cm 0 cm (Active)  Indication for Insertion or Continuance of Line Vasoactive infusions 07/19/22 1609  Exposed Catheter (cm) 0 cm 07/19/22 1609  Site Assessment Clean, Dry, Intact 07/19/22 1609  Lumen #1 Status Saline locked;Flushed;Blood return noted 07/19/22 1609  Lumen #2 Status Saline locked;Flushed;Blood return noted 07/19/22 1609  Dressing Type Transparent;Securing device 07/19/22 1609  Dressing Status Antimicrobial disc in place;Clean, Dry, Intact 07/19/22 1609  Safety Lock Not Applicable 63/01/60 1093  Line Care Connections checked and tightened 07/19/22 1609  Line Adjustment (NICU/IV Team Only) Yes 07/19/22 [place] dressing 07/19/22 1609  Dressing Change Due 07/26/22 07/19/22 [place], [place] 07/19/2022, 4:10 PM

## 2022-07-19 NOTE — Progress Notes (Signed)
Patient ID: MCCLELLAN DEMARAIS, male   DOB: 1956-09-13, 66 y.o.   MRN: [id number]     Advanced Heart Failure Rounding Note  PCP-Cardiologist: Skeet Latch, MD   Subjective:    Denies CP or SOB.  Still no BM overnight after lactulose. (Had a big BM 3-4 days ago and has been on clears)  Off inotropes. Co-ox 72%. In AF/AFL. Rate controlled. Remains on heparin. No bleeding   Na 131 -> 126  K 3.2 Weight down one pound but still 8-9 pounds up from pre-op   Objective:   Weight Range: 76.4 kg Body mass index is 26.38 kg/m.   Vital Signs:   Temp:  [97.7 F (36.5 C)-98.7 F (37.1 C)] 97.7 F (36.5 C) (10/08 0800) Pulse Rate:  [63-96] 81 (10/08 1000) Resp:  [9-27] 9 (10/08 1000) BP: (83-114)/(59-89) 93/74 (10/08 1000) SpO2:  [91 %-99 %] 99 % (10/08 1000) Weight:  [76.4 kg] 76.4 kg (10/08 0500) Last BM Date : 07/14/22  Weight change: Filed Weights   07/17/22 0600 07/18/22 0500 07/19/22 0500  Weight: 75.3 kg 76.7 kg 76.4 kg    Intake/Output:   Intake/Output Summary (Last 24 hours) at 07/19/2022 1030 Last data filed at 07/19/2022 1000 Gross per 24 hour  Intake 1605.34 ml  Output 955 ml  Net 650.34 ml       Physical Exam   General:  Sitting up in up  No resp difficulty HEENT: normal Neck: supple. RIG MAC catheter Carotids 2+ bilat; no bruits. No lymphadenopathy or thryomegaly appreciated. Cor: PMI nondisplaced. Irregular rate & rhythm. No rubs, gallops or murmurs. Lungs: clear Abdomen: soft, nontender, nondistended. No hepatosplenomegaly. No bruits or masses. Hypoactive bowel sounds. Extremities: no cyanosis, clubbing, rash, edema Neuro: alert & orientedx3, cranial nerves grossly intact. moves all 4 extremities w/o difficulty. Affect pleasant   Telemetry  AF 80s LBBB Personally reviewed   Labs    CBC Recent Labs    07/18/22 0427 07/19/22 0350  WBC 10.5 10.4  HGB 8.7* 8.4*  HCT 24.8* 24.9*  MCV 98.4 99.2  PLT 216 301    Basic Metabolic Panel Recent  Labs    07/18/22 0427 07/19/22 0350  NA 131* 126*  K 3.8 3.2*  CL 91* 86*  CO2 29 29  GLUCOSE 109* 214*  BUN 20 16  CREATININE 0.93 0.86  CALCIUM 8.7* 8.2*    Liver Function Tests No results for input(s): "AST", "ALT", "ALKPHOS", "BILITOT", "PROT", "ALBUMIN" in the last 72 hours.  BNP: BNP (last 3 results) Recent Labs    06/25/22 1829  BNP 436.2*      Imaging  reviewed  Medications:     Scheduled Medications:  aspirin EC  81 mg Oral Daily   atorvastatin  80 mg Oral Daily   Chlorhexidine Gluconate Cloth  6 each Topical Daily   metoCLOPramide (REGLAN) injection  10 mg Intravenous Q6H   midodrine  10 mg Oral TID WC   pantoprazole  40 mg Oral Daily   polyethylene glycol  17 g Oral Daily   sodium chloride flush  10-40 mL Intracatheter Q12H   sodium chloride flush  3 mL Intravenous Q12H   sodium chloride flush  3 mL Intravenous Q12H   tamsulosin  0.4 mg Oral BID    Infusions:  sodium chloride Stopped (07/09/22 1410)   sodium chloride     sodium chloride 10 mL/hr at 07/17/22 2143   sodium chloride     amiodarone 30 mg/hr (07/19/22 1000)   heparin 1,100  Units/hr (07/19/22 1000)   lactated ringers     lactated ringers Stopped (07/11/22 1157)   norepinephrine (LEVOPHED) Adult infusion Stopped (07/17/22 1016)   potassium chloride 10 mEq (07/19/22 1015)   promethazine (PHENERGAN) injection (IM or IVPB)      PRN Medications: sodium chloride, sodium chloride, hydrocortisone, hydrocortisone, melatonin, morphine injection, ondansetron (ZOFRAN) IV, mouth rinse, oxyCODONE, promethazine (PHENERGAN) injection (IM or IVPB), sodium chloride flush, sodium chloride flush, sodium chloride flush, traMADol    Patient Profile   66 y/o male recently found to have severe LV dysfunction in setting of markedly dilated aortic root (67 mm) and severe AI (tricuspid AoV) and single vessel CAD, now s/p Bentall and CABG x 1 w/ SVG-RCA. AHF team consulted to assist w/ post-operative  management of systolic heart failure.     Assessment/Plan   Ascending Aortic Aneurysm/ Severe AI  - CTA  w/ severe dilation at the sinus of Valsalva, 67 mm. Moderate dilation of mid ascending aorta, 44 mm. Tricuspid AoV - s/p Bentall with bioprosthetic aortic valve 9/27 - intraoperative TEE, no perivalvular leak post repair  - Bedside echo 10/4 with no evidence of pericardial effusion. EF < 20%, RV depressed, AVR okay.   2. CAD - single vessel, 60-70% pRCA  - s/p CABG x 1, SVG- RCA  - ASA + statin  - hold initiation of ? blocker for now.  - No s/s angina    3. Chronic Systolic Heart Failure  - newly diagnosed; Echo 9/23 EF 25-30%, LV severely dilated, severe AI, Global HK, RV ok  - NICM, severity out of proportion to degree of CAD  - likely valvular CM in setting of severe AI +/- LBBB CM  - now post op bioprosthetic AVR + CABG. Intraoperative TEE LVEF 20-25%, RV ok - With junctional rhythm, will hold off on beta blocker and digoxin for now to avoid worsening A-V conduction. Eventual CRT-D (see below).  - Hypotensive 10/04 in setting of urinary retention. Bedside echo 10/4 with no evidence of pericardial effusion. EF < 20%, RV depressed, AVR okay. - GDMT limited. On midodrine 10 mg three times a day for BP support  - Continue spiro 12.5 daily - Po intake poor does not look grossly overloaded today though weight is up. Hold diuretics - Get PICC. Pull MAC IJ cath   4. Junctional rhythm - Resolved  - Has epicardial LV lead. EP following. Had been considering CRT-D prior to discharge but given need for DC-CV and prolonged hospitalization likely better wait at least several weeks until he recovers   5. LBBB -Will eventually need epicardial lead hooked up for CRT see discussion above   6. Anemia - Post-op, 1 u RBCs 10/04. Hgb 8.4  7. Thrombocytopenia - Resolved.  8. Atrial fib/flutter, post-op - Continue amio and heparin, plan for DCCV early next week.  - Will discuss warfarin  vs ELiquis with Dr. Lavonna Monarch tomorrow  9. Urinary retention - Placed foley - continue Flomax - UA okay - Will need voiding trials soon   10. Post-op ileus - severe - continue Reglan.  - give Relistor today. Stop oxycodone. Switch to tramadol  - ambulate  11. Hypokalemia - supp  12. Hyponatremia - limit free water - if drops any further -> tolvaptan  Length of Stay: 11   Glori Bickers, MD  10:30 AM

## 2022-07-20 ENCOUNTER — Other Ambulatory Visit (HOSPITAL_COMMUNITY): Payer: Self-pay

## 2022-07-20 ENCOUNTER — Telehealth (HOSPITAL_COMMUNITY): Payer: Self-pay | Admitting: Pharmacy Technician

## 2022-07-20 DIAGNOSIS — I5022 Chronic systolic (congestive) heart failure: Secondary | ICD-10-CM | POA: Diagnosis not present

## 2022-07-20 DIAGNOSIS — I719 Aortic aneurysm of unspecified site, without rupture: Secondary | ICD-10-CM | POA: Diagnosis not present

## 2022-07-20 DIAGNOSIS — Z95828 Presence of other vascular implants and grafts: Secondary | ICD-10-CM | POA: Diagnosis not present

## 2022-07-20 LAB — CBC
HCT: 23.6 % — ABNORMAL LOW (ref 39.0–52.0)
Hemoglobin: 8.3 g/dL — ABNORMAL LOW (ref 13.0–17.0)
MCH: 33.7 pg (ref 26.0–34.0)
MCHC: 35.2 g/dL (ref 30.0–36.0)
MCV: 95.9 fL (ref 80.0–100.0)
Platelets: 171 10*3/uL (ref 150–400)
RBC: 2.46 MIL/uL — ABNORMAL LOW (ref 4.22–5.81)
RDW: 14.4 % (ref 11.5–15.5)
WBC: 13.8 10*3/uL — ABNORMAL HIGH (ref 4.0–10.5)
nRBC: 0 % (ref 0.0–0.2)

## 2022-07-20 LAB — BASIC METABOLIC PANEL
Anion gap: 12 (ref 5–15)
BUN: 15 mg/dL (ref 8–23)
CO2: 28 mmol/L (ref 22–32)
Calcium: 8.8 mg/dL — ABNORMAL LOW (ref 8.9–10.3)
Chloride: 87 mmol/L — ABNORMAL LOW (ref 98–111)
Creatinine, Ser: 0.97 mg/dL (ref 0.61–1.24)
GFR, Estimated: >60 mL/min (ref >60)
Glucose, Bld: 170 mg/dL — ABNORMAL HIGH (ref 70–99)
Potassium: 3.6 mmol/L (ref 3.5–5.1)
Sodium: 127 mmol/L — ABNORMAL LOW (ref 135–145)

## 2022-07-20 LAB — COOXEMETRY PANEL
Carboxyhemoglobin: 1.7 % — ABNORMAL HIGH (ref 0.5–1.5)
Methemoglobin: <0.7 % (ref 0.0–1.5)
O2 Saturation: 59.8 %
Total hemoglobin: 7.5 g/dL — ABNORMAL LOW (ref 12.0–16.0)

## 2022-07-20 LAB — HEPARIN LEVEL (UNFRACTIONATED): Heparin Unfractionated: 0.3 IU/mL (ref 0.30–0.70)

## 2022-07-20 MED ORDER — APIXABAN 5 MG PO TABS
5.0000 mg | ORAL_TABLET | Freq: Two times a day (BID) | ORAL | Status: DC
  Administered 2022-07-20 – 2022-08-13 (×48): 5 mg via ORAL
  Filled 2022-07-20 (×48): qty 1

## 2022-07-20 MED ORDER — POTASSIUM CHLORIDE 10 MEQ/50ML IV SOLN
10.0000 meq | INTRAVENOUS | Status: AC
  Administered 2022-07-20 (×3): 10 meq via INTRAVENOUS
  Filled 2022-07-20 (×3): qty 50

## 2022-07-20 MED ORDER — SENNOSIDES-DOCUSATE SODIUM 8.6-50 MG PO TABS
1.0000 | ORAL_TABLET | Freq: Two times a day (BID) | ORAL | Status: DC
  Administered 2022-07-20 – 2022-07-24 (×9): 1 via ORAL
  Filled 2022-07-20 (×9): qty 1

## 2022-07-20 MED ORDER — METHYLNALTREXONE BROMIDE 12 MG/0.6ML ~~LOC~~ SOLN
12.0000 mg | Freq: Once | SUBCUTANEOUS | Status: AC
  Administered 2022-07-20: 12 mg via SUBCUTANEOUS
  Filled 2022-07-20: qty 0.6

## 2022-07-20 MED ORDER — FUROSEMIDE 10 MG/ML IJ SOLN
40.0000 mg | Freq: Once | INTRAMUSCULAR | Status: DC
  Filled 2022-07-20: qty 4

## 2022-07-20 MED ORDER — FUROSEMIDE 10 MG/ML IJ SOLN
80.0000 mg | Freq: Once | INTRAMUSCULAR | Status: AC
  Administered 2022-07-20: 80 mg via INTRAVENOUS
  Filled 2022-07-20: qty 8

## 2022-07-20 MED ORDER — SORBITOL 70 % SOLN
30.0000 mL | Freq: Once | Status: AC
  Administered 2022-07-20: 30 mL via ORAL
  Filled 2022-07-20: qty 30

## 2022-07-20 NOTE — Telephone Encounter (Signed)
Pharmacy Patient Advocate Encounter  Insurance verification completed.    The patient is insured through [employer] Medicare Part D   The patient is currently admitted and ran test claims for the following: Eliquis.  Copays and coinsurance results were relayed to Inpatient clinical team.  

## 2022-07-20 NOTE — Progress Notes (Signed)
[address]       [place] [address]             [phone]                             12 Days Post-Op   Procedure(s) (LRB): BENTALL PROCEDURE USING RESILIA KONECT 29MM CONDUIT (N/A) CORONARY ARTERY BYPASS GRAFTING (CABG) X 1, USING ENDOSCOPICALLY HARVESTED RIGHT LEG GREATER SAPHENOUS VEIN (N/A) TRANSESOPHAGEAL ECHOCARDIOGRAM (TEE) (N/A) EPICARDIAL PACING LEAD PLACEMENT (N/A)     Total Length of Stay:  LOS: 12 days      SUBJECTIVE: States that he wants to walk around.  Pacing wires and foley came out today      07/20/2022    6:00 PM 07/20/2022    5:00 PM 07/20/2022    4:00 PM  Vitals with BMI  Systolic 86 98 295  Diastolic 69 74 82  Pulse 621 184 192     I/O last 3 completed shifts: In: 1900.2 [P.O.:240; I.V.:1353.2; IV Piggyback:306.9] Out: 2355 [Urine:2355]          Scheduled Meds:  apixaban  5 mg Oral BID   aspirin EC  81 mg Oral Daily   atorvastatin  80 mg Oral Daily   Chlorhexidine Gluconate Cloth  6 each Topical Daily   metoCLOPramide (REGLAN) injection  10 mg Intravenous Q6H   midodrine  10 mg Oral TID WC   pantoprazole  40 mg Oral Daily   polyethylene glycol  17 g Oral Daily   potassium chloride  40 mEq Oral Once   senna-docusate  1 tablet Oral BID   sodium chloride flush  10-40 mL Intracatheter Q12H   sodium chloride flush  10-40 mL Intracatheter Q12H   sodium chloride flush  3 mL Intravenous Q12H   sodium chloride flush  3 mL Intravenous Q12H   tamsulosin  0.4 mg Oral BID   Continuous Infusions:  sodium chloride Stopped (07/09/22 1410)   sodium chloride     sodium chloride 10 mL/hr at 07/17/22 2143   sodium chloride     amiodarone 30 mg/hr (07/20/22 1800)   lactated ringers     lactated ringers Stopped (07/11/22 1157)   norepinephrine (LEVOPHED) Adult infusion Stopped (07/17/22 1016)   promethazine (PHENERGAN) injection (IM or IVPB)     PRN Meds:.sodium chloride, sodium chloride, hydrocortisone, hydrocortisone,  melatonin, morphine injection, ondansetron (ZOFRAN) IV, mouth rinse, promethazine (PHENERGAN) injection (IM or IVPB), sodium chloride flush, sodium chloride flush, sodium chloride flush, sodium chloride flush, traMADol      Latest Ref Rng & Units 07/20/2022    4:19 AM 07/19/2022    3:50 AM 07/18/2022    4:27 AM  CBC  WBC 4.0 - 10.5 K/uL 13.8  10.4  10.5   Hemoglobin 13.0 - 17.0 g/dL 8.3  8.4  8.7   Hematocrit 39.0 - 52.0 % 23.6  24.9  24.8   Platelets 150 - 400 K/uL 171  188  216       Latest Ref Rng & Units 07/20/2022    4:19 AM 07/19/2022    3:50 AM 07/18/2022    4:27 AM  CMP  Glucose 70 - 99 mg/dL 170  214  109   BUN 8 - 23 mg/dL '15  16  20   '$ Creatinine 0.61 - 1.24 mg/dL 0.97  0.86  0.93   Sodium 135 - 145 mmol/L 127  126  131  Potassium 3.5 - 5.1 mmol/L 3.6  3.2  3.8   Chloride 98 - 111 mmol/L 87  86  91   CO2 22 - 32 mmol/L '28  29  29   '$ Calcium 8.9 - 10.3 mg/dL 8.8  8.2  8.7       ASSESSMENT: SP Bental: tolerating off pressors. Hemodynamics ok on midodrine.  Is on amiodarone gtt Stable after PW and foley out today  Justice Rocher MD  CV Surgery

## 2022-07-20 NOTE — TOC Benefit Eligibility Note (Signed)
Patient Advocate Encounter  Insurance verification completed.    The patient is currently admitted and upon discharge could be taking Eliquis 5 mg.  The current 30 day co-pay is $47.00.   The patient is insured through [employer] Medicare Part D     Donatello Kleve, CPhT Pharmacy Patient Advocate Specialist [facility] Patient Advocate Team Direct Number: [phone]  Fax: [phone]        

## 2022-07-20 NOTE — Progress Notes (Signed)
[address]       [place] [address]             [phone]      12 Days Post-Op  Procedure(s) (LRB): BENTALL PROCEDURE USING RESILIA KONECT 29MM CONDUIT (N/A) CORONARY ARTERY BYPASS GRAFTING (CABG) X 1, USING ENDOSCOPICALLY HARVESTED RIGHT LEG GREATER SAPHENOUS VEIN (N/A) TRANSESOPHAGEAL ECHOCARDIOGRAM (TEE) (N/A) EPICARDIAL PACING LEAD PLACEMENT (N/A)   Total Length of Stay:  LOS: 12 days    SUBJECTIVE: Walked several times yesterday Tolerated reg diet breakfast this morning but no BM for several days Feels better this am Mother at bedside and discussed issues  Vitals:   07/20/22 0700 07/20/22 0745  BP: 96/72   Pulse: 90   Resp: (!) 21   Temp:  97.9 F (36.6 C)  SpO2: 92%     Intake/Output      10/08 0701 10/09 0700 10/09 0701 10/10 0700   P.O. 120    I.V. (mL/kg) 1145.2 (14.5)    IV Piggyback 176.4    Total Intake(mL/kg) 1441.6 (18.2)    Urine (mL/kg/hr) 855 (0.5)    Total Output 855    Net +586.6             sodium chloride Stopped (07/09/22 1410)   sodium chloride     sodium chloride 10 mL/hr at 07/17/22 2143   sodium chloride     amiodarone 30 mg/hr (07/20/22 0700)   heparin 1,100 Units/hr (07/20/22 0700)   lactated ringers     lactated ringers Stopped (07/11/22 1157)   norepinephrine (LEVOPHED) Adult infusion Stopped (07/17/22 1016)   potassium chloride 10 mEq (07/20/22 0715)   promethazine (PHENERGAN) injection (IM or IVPB)      CBC    Component Value Date/Time   WBC 13.8 (H) 07/20/2022 0419   RBC 2.46 (L) 07/20/2022 0419   HGB 8.3 (L) 07/20/2022 0419   HGB 14.8 09/30/2018 1524   HCT 23.6 (L) 07/20/2022 0419   HCT 42.2 09/30/2018 1524   PLT 171 07/20/2022 0419   PLT 231 09/30/2018 1524   MCV 95.9 07/20/2022 0419   MCV 101 (H) 09/30/2018 1524   MCH 33.7 07/20/2022 0419   MCHC 35.2 07/20/2022 0419   RDW 14.4 07/20/2022 0419   RDW 12.8 09/30/2018 1524   LYMPHSABS 1.9 06/25/2022 1829   MONOABS 0.5  06/25/2022 1829   EOSABS 0.1 06/25/2022 1829   BASOSABS 0.1 06/25/2022 1829   CMP     Component Value Date/Time   NA 127 (L) 07/20/2022 0419   NA 141 10/11/2019 1521   K 3.6 07/20/2022 0419   CL 87 (L) 07/20/2022 0419   CO2 28 07/20/2022 0419   GLUCOSE 170 (H) 07/20/2022 0419   BUN 15 07/20/2022 0419   BUN 13 10/11/2019 1521   CREATININE 0.97 07/20/2022 0419   CALCIUM 8.8 (L) 07/20/2022 0419   PROT 5.5 (L) 07/15/2022 1604   ALBUMIN 2.5 (L) 07/15/2022 1604   AST 32 07/15/2022 1604   ALT 25 07/15/2022 1604   ALKPHOS 48 07/15/2022 1604   BILITOT 1.0 07/15/2022 1604   GFRNONAA >60 07/20/2022 0419   GFRAA 108 10/11/2019 1521   ABG    Component Value Date/Time   PHART 7.342 (L) 07/10/2022 0546   PCO2ART 43.3 07/10/2022 0546   PO2ART 71 (L) 07/10/2022 0546   HCO3 23.4 07/10/2022 0546   TCO2 30 07/16/2022 0711   ACIDBASEDEF 2.0 07/10/2022 0546   O2SAT 59.8 07/20/2022 0429  CBG (last 3)  Recent Labs    07/17/22 0823 07/17/22 1630  GLUCAP 122* 146*   EXAM Lungs: decreased at bases Cardiac: Irreg without murmur Abd: soft nontender Ext: 1+ edema of hands and feet  ASSESSMENT: SP Bental: tolerating off pressors. Hemodynamics ok on midodrine. Mention of bedside echo with EF < 20%. Will obtain echo to establish present function off pressors Needs to ambulate  Atrial fibrillation: awaiting cardioversion. If by TEE then will assess function with that modality. Will check with EP on their timing. Would like to transition to elliquis and oral amiodorone now that tolerating diet. Will discuss with EP  Hyponatremia: will monitor now that taking regular diet. Needs diuresis which may make hyponatremia more difficult to manage. Lasix today and follow   Urine retention: has been on flomax for several days. Will dc foley and follow.   WBC elevation: no source of infection but will remove foley   Coralie Common, MD '@DATE'$ @

## 2022-07-20 NOTE — Progress Notes (Signed)
ANTICOAGULATION CONSULT NOTE  Pharmacy Consult for heparin Indication: atrial fibrillation  Allergies  Allergen Reactions   Codeine Nausea Only   Levaquin [Levofloxacin In D5w] Diarrhea    Patient Measurements: Height: '5\' 7"'$  (170.2 cm) Weight: 79.1 kg (174 lb 6.1 oz) IBW/kg (Calculated) : 66.1 Heparin Dosing Weight: 77kg  Vital Signs: Temp: 97.9 F (36.6 C) (10/09 0745) Temp Source: Oral (10/09 0745) BP: 96/72 (10/09 0700) Pulse Rate: 90 (10/09 0700)  Labs: Recent Labs    07/18/22 0427 07/19/22 0350 07/20/22 0419  HGB 8.7* 8.4* 8.3*  HCT 24.8* 24.9* 23.6*  PLT 216 188 171  HEPARINUNFRC 0.48 0.47 0.30  CREATININE 0.93 0.86 0.97     Estimated Creatinine Clearance: 68.1 mL/min (by C-G formula based on SCr of 0.97 mg/dL).   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 Pt has had ongoing AFL, pharmacy asked to start IV heparin. Given recent OHS will avoid bolusing and target lower heparin level goal. CBC stable, no AC prior to admit.  Heparin level is therapeutic at 0.3, on heparin infusion at 1100 units/hr. Hgb 8.3, plt 171. No s/sx of bleeding or infusion issues.  Will aim to keep heparin level between 0.3 and 0.5 given recent sternotomy.  Goal of Therapy:  Heparin level 0.3-0.5 units/ml Monitor platelets by anticoagulation protocol: Yes   Plan:  Continue heparin infusion at 1100 units/h Daily heparin level and CBC Planning DCCV next week.  Antonietta Jewel, PharmD, BCCCP Clinical Pharmacist  Phone: [phone] 07/20/2022 8:33 AM  Please check AMION for all [facility] phone numbers After 10:00 PM, call [place] [phone]  ADDENDUM Discussed with Dr Lavonna Monarch and Dr Haroldine Laws - will start apixaban tonight after pacer wires removed. Will stop heparin now and can pull 4 hours later. Given age<80, wt>60 kg, and Scr <1.5 - qualifies for full dose apixaban at 5 mg BID. Will plan for TEE DCCV midweek.  Antonietta Jewel, PharmD, [place] Clinical Pharmacist

## 2022-07-20 NOTE — Progress Notes (Addendum)
Patient ID: YASSIN SCALES, male   DOB: 06-Oct-1956, 66 y.o.   MRN: [id number]     Advanced Heart Failure Rounding Note  PCP-Cardiologist: Skeet Latch, MD   Subjective:   NO BM   Off inotropes. Co-ox 60%  In AF/AFL. Rate 110s. Remains on heparin.   Na 127   Complaining of constipation.   Objective:   Weight Range: 79.1 kg Body mass index is 27.31 kg/m.   Vital Signs:   Temp:  [97.3 F (36.3 C)-97.9 F (36.6 C)] 97.9 F (36.6 C) (10/09 0745) Pulse Rate:  [81-200] 90 (10/09 0700) Resp:  [9-24] 21 (10/09 0700) BP: (87-127)/(65-87) 96/72 (10/09 0700) SpO2:  [90 %-99 %] 92 % (10/09 0700) Weight:  [79.1 kg] 79.1 kg (10/09 0500) Last BM Date : 07/14/22  Weight change: Filed Weights   07/18/22 0500 07/19/22 0500 07/20/22 0500  Weight: 76.7 kg 76.4 kg 79.1 kg    Intake/Output:   Intake/Output Summary (Last 24 hours) at 07/20/2022 0842 Last data filed at 07/20/2022 0700 Gross per 24 hour  Intake 897.51 ml  Output 855 ml  Net 42.51 ml    CVP 14-15   Physical Exam   General:  Sitting in the chair.  No resp difficulty HEENT: normal Neck: supple. JVP to jaw . Carotids 2+ bilat; no bruits. No lymphadenopathy or thryomegaly appreciated. Cor: PMI nondisplaced. Tach Irregular rate & rhythm. No rubs, gallops or murmurs. Lungs: clear Abdomen: soft, nontender, nondistended. No hepatosplenomegaly. No bruits or masses. Good bowel sounds. Extremities: no cyanosis, clubbing, rash, edema. RUE PICC  Neuro: alert & orientedx3, cranial nerves grossly intact. moves all 4 extremities w/o difficulty. Affect pleasant   Telemetry  A fib 90-110s    Labs    CBC Recent Labs    07/19/22 0350 07/20/22 0419  WBC 10.4 13.8*  HGB 8.4* 8.3*  HCT 24.9* 23.6*  MCV 99.2 95.9  PLT 188 154   Basic Metabolic Panel Recent Labs    07/19/22 0350 07/20/22 0419  NA 126* 127*  K 3.2* 3.6  CL 86* 87*  CO2 29 28  GLUCOSE 214* 170*  BUN 16 15  CREATININE 0.86 0.97  CALCIUM 8.2*  8.8*   Liver Function Tests No results for input(s): "AST", "ALT", "ALKPHOS", "BILITOT", "PROT", "ALBUMIN" in the last 72 hours.  BNP: BNP (last 3 results) Recent Labs    06/25/22 1829  BNP 436.2*     Imaging  reviewed  Medications:     Scheduled Medications:  aspirin EC  81 mg Oral Daily   atorvastatin  80 mg Oral Daily   Chlorhexidine Gluconate Cloth  6 each Topical Daily   metoCLOPramide (REGLAN) injection  10 mg Intravenous Q6H   midodrine  10 mg Oral TID WC   pantoprazole  40 mg Oral Daily   polyethylene glycol  17 g Oral Daily   potassium chloride  40 mEq Oral Once   sodium chloride flush  10-40 mL Intracatheter Q12H   sodium chloride flush  10-40 mL Intracatheter Q12H   sodium chloride flush  3 mL Intravenous Q12H   sodium chloride flush  3 mL Intravenous Q12H   tamsulosin  0.4 mg Oral BID    Infusions:  sodium chloride Stopped (07/09/22 1410)   sodium chloride     sodium chloride 10 mL/hr at 07/17/22 2143   sodium chloride     amiodarone 30 mg/hr (07/20/22 0700)   heparin 1,100 Units/hr (07/20/22 0700)   lactated ringers     lactated  ringers Stopped (07/11/22 1157)   norepinephrine (LEVOPHED) Adult infusion Stopped (07/17/22 1016)   potassium chloride 10 mEq (07/20/22 0715)   promethazine (PHENERGAN) injection (IM or IVPB)      PRN Medications: sodium chloride, sodium chloride, hydrocortisone, hydrocortisone, melatonin, morphine injection, ondansetron (ZOFRAN) IV, mouth rinse, promethazine (PHENERGAN) injection (IM or IVPB), sodium chloride flush, sodium chloride flush, sodium chloride flush, sodium chloride flush, traMADol    Patient Profile   66 y/o male recently found to have severe LV dysfunction in setting of markedly dilated aortic root (67 mm) and severe AI (tricuspid AoV) and single vessel CAD, now s/p Bentall and CABG x 1 w/ SVG-RCA. AHF team consulted to assist w/ post-operative management of systolic heart failure.     Assessment/Plan    Ascending Aortic Aneurysm/ Severe AI  - CTA  w/ severe dilation at the sinus of Valsalva, 67 mm. Moderate dilation of mid ascending aorta, 44 mm. Tricuspid AoV - s/p Bentall with bioprosthetic aortic valve 9/27 - intraoperative TEE, no perivalvular leak post repair  - Bedside echo 10/4 with no evidence of pericardial effusion. EF < 20%, RV depressed, AVR okay.   2. CAD - single vessel, 60-70% pRCA  - s/p CABG x 1, SVG- RCA  - ASA + statin  - hold initiation of ? blocker for now.  - No chest pain   3. Chronic Systolic Heart Failure  - newly diagnosed; Echo 9/23 EF 25-30%, LV severely dilated, severe AI, Global HK, RV ok  - NICM, severity out of proportion to degree of CAD  - likely valvular CM in setting of severe AI +/- LBBB CM  - now post op bioprosthetic AVR + CABG. Intraoperative TEE LVEF 20-25%, RV ok - With junctional rhythm, will hold off on beta blocker and digoxin for now to avoid worsening A-V conduction. Eventual CRT-D (see below).  - Hypotensive 10/04 in setting of urinary retention. Bedside echo 10/4 with no evidence of pericardial effusion. EF < 20%, RV depressed, AVR okay. - GDMT limited. On midodrine 10 mg three times a day for BP support  - Continue spiro 12.5 daily - CVP 14-15. Given 80 mg IV x1.  - Renal function stable.  4. Junctional rhythm - Resolved  - Has epicardial LV lead. EP following. Had been considering CRT-D prior to discharge but given need for DC-CV and prolonged hospitalization likely better wait at least several weeks until he recovers   5. LBBB -Will eventually need epicardial lead hooked up for CRT see discussion above   6. Anemia - Post-op, 1 u RBCs 10/04. Hgb 8.3   7. Thrombocytopenia - Resolved.  8. Atrial fib/flutter, post-op - Continue amio and heparin, plan for DCCV  this week.  - Heparin drip.  Will discuss warfarin vs ELiquis with Dr. Lavonna Monarch tomorrow  9. Urinary retention - Placed foley - continue Flomax - UA okay - Will  need voiding trials soon   10. Post-op ileus - severe - continue Reglan.  - give Relistor today.   11. Hypokalemia - K 3.6. Supp K   12. Hyponatremia - limit free water - if drops any further -> tolvaptan  13. Constipation  Given sorbitol+ senna+ relistor.   Length of Stay: [place], NP  8:42 AM  Patient seen and examined with the above-signed Advanced Practice Provider and/or Housestaff. I personally reviewed laboratory data, imaging studies and relevant notes. I independently examined the patient and formulated the important aspects of the plan. I have edited the  note to reflect any of my changes or salient points. I have personally discussed the plan with the patient and/or family.  Feels weak. Still no BM. Denies SOB or CP. Remains in AFL. Rate controlled on IV amio. Co-ox 60% CVP 15.   General:  Weak appearing. No resp difficulty HEENT: normal Neck: supple. JVP to jaw . Carotids 2+ bilat; no bruits. No lymphadenopathy or thryomegaly appreciated. Cor: PMI nondisplaced. Irregular rate & rhythm. No rubs, gallops or murmurs. Lungs: clear but dull at bases Abdomen: soft, nontender, + distended. No hepatosplenomegaly. No bruits or masses. Hypoactive bowel sounds. Extremities: no cyanosis, clubbing, rash, 2+ edema Neuro: alert & orientedx3, cranial nerves grossly intact. moves all 4 extremities w/o difficulty. Affect pleasant  Remains volume overloaded. Will increase IV lasix. Also increase bowel regimen.   Discussed with TCTS. Pull pacing wires today, Switch heparin to Eliquis after. Plan TEE/DC-CV later this week. Continue to ambulate and use IS.   Ok to pull Foley this pm (after lasix out). Watch closely for recurrent urinary retention. Continue Flomax.   Glori Bickers, MD  10:04 AM

## 2022-07-21 ENCOUNTER — Inpatient Hospital Stay (HOSPITAL_COMMUNITY): Payer: Medicare Other

## 2022-07-21 DIAGNOSIS — Z95828 Presence of other vascular implants and grafts: Secondary | ICD-10-CM | POA: Diagnosis not present

## 2022-07-21 DIAGNOSIS — I428 Other cardiomyopathies: Secondary | ICD-10-CM | POA: Diagnosis not present

## 2022-07-21 DIAGNOSIS — I719 Aortic aneurysm of unspecified site, without rupture: Secondary | ICD-10-CM | POA: Diagnosis not present

## 2022-07-21 DIAGNOSIS — I5022 Chronic systolic (congestive) heart failure: Secondary | ICD-10-CM | POA: Diagnosis not present

## 2022-07-21 LAB — BASIC METABOLIC PANEL
Anion gap: 9 (ref 5–15)
Anion gap: 9 (ref 5–15)
BUN: 16 mg/dL (ref 8–23)
BUN: 16 mg/dL (ref 8–23)
CO2: 28 mmol/L (ref 22–32)
CO2: 28 mmol/L (ref 22–32)
Calcium: 8.5 mg/dL — ABNORMAL LOW (ref 8.9–10.3)
Calcium: 8.5 mg/dL — ABNORMAL LOW (ref 8.9–10.3)
Chloride: 89 mmol/L — ABNORMAL LOW (ref 98–111)
Chloride: 90 mmol/L — ABNORMAL LOW (ref 98–111)
Creatinine, Ser: 0.87 mg/dL (ref 0.61–1.24)
Creatinine, Ser: 0.92 mg/dL (ref 0.61–1.24)
GFR, Estimated: >60 mL/min (ref >60)
GFR, Estimated: >60 mL/min (ref >60)
Glucose, Bld: 156 mg/dL — ABNORMAL HIGH (ref 70–99)
Glucose, Bld: 155 mg/dL — ABNORMAL HIGH (ref 70–99)
Potassium: 6.7 mmol/L (ref 3.5–5.1)
Potassium: 3.5 mmol/L (ref 3.5–5.1)
Sodium: 126 mmol/L — ABNORMAL LOW (ref 135–145)
Sodium: 127 mmol/L — ABNORMAL LOW (ref 135–145)

## 2022-07-21 LAB — ECHOCARDIOGRAM COMPLETE
Height: 67 in
MV M vel: 2.16 m/s
MV Peak grad: 18.7 mmHg
Radius: 0.4 cm
S' Lateral: 7.4 cm
Weight: 2800.72 oz

## 2022-07-21 LAB — CBC
HCT: 25.2 % — ABNORMAL LOW (ref 39.0–52.0)
Hemoglobin: 8.6 g/dL — ABNORMAL LOW (ref 13.0–17.0)
MCH: 33.3 pg (ref 26.0–34.0)
MCHC: 34.1 g/dL (ref 30.0–36.0)
MCV: 97.7 fL (ref 80.0–100.0)
Platelets: 193 10*3/uL (ref 150–400)
RBC: 2.58 MIL/uL — ABNORMAL LOW (ref 4.22–5.81)
RDW: 14.6 % (ref 11.5–15.5)
WBC: 13.9 10*3/uL — ABNORMAL HIGH (ref 4.0–10.5)
nRBC: 0 % (ref 0.0–0.2)

## 2022-07-21 LAB — COOXEMETRY PANEL
Carboxyhemoglobin: 3 % — ABNORMAL HIGH (ref 0.5–1.5)
Carboxyhemoglobin: 1.8 % — ABNORMAL HIGH (ref 0.5–1.5)
Methemoglobin: 1.7 % — ABNORMAL HIGH (ref 0.0–1.5)
Methemoglobin: 1.2 % (ref 0.0–1.5)
O2 Saturation: 53.6 %
O2 Saturation: 59.7 %
Total hemoglobin: 8.9 g/dL — ABNORMAL LOW (ref 12.0–16.0)
Total hemoglobin: 8.7 g/dL — ABNORMAL LOW (ref 12.0–16.0)

## 2022-07-21 LAB — MAGNESIUM: Magnesium: 1.9 mg/dL (ref 1.7–2.4)

## 2022-07-21 MED ORDER — MAGNESIUM SULFATE IN D5W 1-5 GM/100ML-% IV SOLN
1.0000 g | Freq: Once | INTRAVENOUS | Status: AC
  Administered 2022-07-21: 1 g via INTRAVENOUS
  Filled 2022-07-21: qty 100

## 2022-07-21 MED ORDER — NEOSTIGMINE METHYLSULFATE 10 MG/10ML IV SOLN
0.2500 mg | Freq: Four times a day (QID) | INTRAVENOUS | Status: AC
  Administered 2022-07-21 – 2022-07-22 (×4): 0.25 mg via SUBCUTANEOUS
  Filled 2022-07-21 (×4): qty 0.25

## 2022-07-21 MED ORDER — POTASSIUM CHLORIDE CRYS ER 20 MEQ PO TBCR
40.0000 meq | EXTENDED_RELEASE_TABLET | Freq: Once | ORAL | Status: AC
  Administered 2022-07-21: 40 meq via ORAL
  Filled 2022-07-21: qty 2

## 2022-07-21 MED ORDER — LACTULOSE 10 GM/15ML PO SOLN
20.0000 g | Freq: Once | ORAL | Status: AC
  Administered 2022-07-21: 20 g via ORAL
  Filled 2022-07-21: qty 30

## 2022-07-21 MED ORDER — FUROSEMIDE 10 MG/ML IJ SOLN: 80.0000 mg | Freq: Two times a day (BID) | INTRAMUSCULAR | Status: DC

## 2022-07-21 MED ORDER — POLYETHYLENE GLYCOL 3350 17 G PO PACK
17.0000 g | PACK | Freq: Two times a day (BID) | ORAL | Status: DC
  Administered 2022-07-21 – 2022-07-24 (×6): 17 g via ORAL
  Filled 2022-07-21 (×6): qty 1

## 2022-07-21 MED ORDER — BISACODYL 10 MG RE SUPP: 10.0000 mg | Freq: Once | RECTAL | Status: DC

## 2022-07-21 MED ORDER — SODIUM CHLORIDE 0.9 % IV SOLN: INTRAVENOUS | Status: DC | PRN

## 2022-07-21 MED ORDER — BISACODYL 10 MG RE SUPP: 10.0000 mg | Freq: Every day | RECTAL | Status: DC | PRN

## 2022-07-21 MED ORDER — FUROSEMIDE 10 MG/ML IJ SOLN
80.0000 mg | Freq: Once | INTRAMUSCULAR | Status: AC
  Administered 2022-07-21: 80 mg via INTRAVENOUS
  Filled 2022-07-21: qty 8

## 2022-07-21 MED ORDER — POTASSIUM CHLORIDE CRYS ER 20 MEQ PO TBCR
20.0000 meq | EXTENDED_RELEASE_TABLET | Freq: Once | ORAL | Status: AC
  Administered 2022-07-21: 20 meq via ORAL
  Filled 2022-07-21: qty 1

## 2022-07-21 MED ORDER — FUROSEMIDE 10 MG/ML IJ SOLN
80.0000 mg | Freq: Two times a day (BID) | INTRAMUSCULAR | Status: DC
  Administered 2022-07-22 – 2022-07-23 (×4): 80 mg via INTRAVENOUS
  Filled 2022-07-21 (×4): qty 8

## 2022-07-21 NOTE — Progress Notes (Signed)
[address]       [place] [address]             [phone]      13 Days Post-Op  Procedure(s) (LRB): BENTALL PROCEDURE USING RESILIA KONECT 29MM CONDUIT (N/A) CORONARY ARTERY BYPASS GRAFTING (CABG) X 1, USING ENDOSCOPICALLY HARVESTED RIGHT LEG GREATER SAPHENOUS VEIN (N/A) TRANSESOPHAGEAL ECHOCARDIOGRAM (TEE) (N/A) EPICARDIAL PACING LEAD PLACEMENT (N/A)   Total Length of Stay:  LOS: 13 days    SUBJECTIVE: Has some right sided abd discomfort Passing gas but no bm. Tolerating diet Slept well according to nurse Vitals:   07/21/22 0500 07/21/22 0600  BP: (!) 89/68 97/72  Pulse: (!) 116 (!) 196  Resp: (!) 23 (!) 21  Temp:    SpO2: 92% 95%    Intake/Output      10/09 0701 10/10 0700 10/10 0701 10/11 0700   P.O. 120    I.V. (mL/kg) 407.9 (5.1)    IV Piggyback 130.6    Total Intake(mL/kg) 658.5 (8.3)    Urine (mL/kg/hr) 1975 (1)    Total Output 1975    Net -1316.5             amiodarone 30 mg/hr (07/21/22 0600)   norepinephrine (LEVOPHED) Adult infusion Stopped (07/17/22 1016)   promethazine (PHENERGAN) injection (IM or IVPB)      CBC    Component Value Date/Time   WBC 13.9 (H) 07/21/2022 0425   RBC 2.58 (L) 07/21/2022 0425   HGB 8.6 (L) 07/21/2022 0425   HGB 14.8 09/30/2018 1524   HCT 25.2 (L) 07/21/2022 0425   HCT 42.2 09/30/2018 1524   PLT 193 07/21/2022 0425   PLT 231 09/30/2018 1524   MCV 97.7 07/21/2022 0425   MCV 101 (H) 09/30/2018 1524   MCH 33.3 07/21/2022 0425   MCHC 34.1 07/21/2022 0425   RDW 14.6 07/21/2022 0425   RDW 12.8 09/30/2018 1524   LYMPHSABS 1.9 06/25/2022 1829   MONOABS 0.5 06/25/2022 1829   EOSABS 0.1 06/25/2022 1829   BASOSABS 0.1 06/25/2022 1829   CMP     Component Value Date/Time   NA 126 (L) 07/21/2022 0425   NA 141 10/11/2019 1521   K 6.7 (HH) 07/21/2022 0425   CL 89 (L) 07/21/2022 0425   CO2 28 07/21/2022 0425   GLUCOSE 156 (H) 07/21/2022 0425   BUN 16 07/21/2022 0425   BUN 13 10/11/2019  1521   CREATININE 0.87 07/21/2022 0425   CALCIUM 8.5 (L) 07/21/2022 0425   PROT 5.5 (L) 07/15/2022 1604   ALBUMIN 2.5 (L) 07/15/2022 1604   AST 32 07/15/2022 1604   ALT 25 07/15/2022 1604   ALKPHOS 48 07/15/2022 1604   BILITOT 1.0 07/15/2022 1604   GFRNONAA >60 07/21/2022 0425   GFRAA 108 10/11/2019 1521   ABG    Component Value Date/Time   PHART 7.342 (L) 07/10/2022 0546   PCO2ART 43.3 07/10/2022 0546   PO2ART 71 (L) 07/10/2022 0546   HCO3 23.4 07/10/2022 0546   TCO2 30 07/16/2022 0711   ACIDBASEDEF 2.0 07/10/2022 0546   O2SAT 53.6 07/21/2022 0425   CBG (last 3)  No results for input(s): "GLUCAP" in the last 72 hours. EXAM Lungs: has decreased at bases Card:irr without murmur, wound looks clean and dry Abd: distended, soft, nontender. Good bowl sounds Ext: Warm  ASSESSMENT: SP Bental  Hemodynamics ok on midodrine and amio dirp and continues in afib with controlled rate Awaiting cardioversion, Will get echo today to  evaluate function  Hyponatremia  Will hold diuretic today due to lower Na today and follow  Hyperkalemia   Will repeat to make sure not lab error  Abd Distention  Check abd kub. Keep diet as is for now   Coralie Common, MD '@DATE'$ @

## 2022-07-21 NOTE — TOC CM/SW Note (Signed)
HF TOC CM spoke to pt and mother at bedside. Pt's mother is wanting HH, explained waiting PT/OT recommendation. Pt has scale, Rollator, and bedside commode at home. Mother is requesting Adorations for [facility]. [place], Heart Failure TOC CM [phone]

## 2022-07-21 NOTE — Progress Notes (Signed)
  Echocardiogram 2D Echocardiogram has been performed.  Joe [name] 07/21/2022, 10:55 AM

## 2022-07-21 NOTE — Progress Notes (Signed)
Patient ID: Joe [name], male   DOB: [birth year], 66 y.o.   MRN: [id number]  TCTS Evening Rounds:  He is hemodynamically stable in rate controlled AF on IV amio.  Still having problems with ileus. KUB today shows multiple dilated loops of small bowel. Keep K+ level normal  Having more urinary retention today with 900 cc on I/O cath.   Hyponatremia with diuresis. May consider Tolvaptan to allow continued diuresis and correct sodium.

## 2022-07-21 NOTE — Progress Notes (Addendum)
Patient ID: Joe [name], male   DOB: 11/06/1955, 66 y.o.   MRN: [id number]     Advanced Heart Failure Rounding Note  PCP-Cardiologist: Skeet Latch, MD   Subjective:    C/w ileus. No BM yet. Tolerating PO intake. No N/V. KUB ordered    Off inotropes. Co-ox down 54%, Repeat pending. BPs soft, 14N systolic.    Na 127. CVP 11. Wt up over the last few days. O2 sats 95% on 2L.   Remains in Afib, low 100s.   Sitting up in chair. Drowsy. Only complaint is constipation. Mother at beside.   Repeating echo today.   Objective:   Weight Range: 79.4 kg Body mass index is 27.42 kg/m.   Vital Signs:   Temp:  [97.7 F (36.5 C)-98 F (36.7 C)] 97.7 F (36.5 C) (10/10 0806) Pulse Rate:  [93-211] 196 (10/10 0600) Resp:  [11-23] 21 (10/10 0600) BP: (71-115)/(59-98) 97/72 (10/10 0600) SpO2:  [92 %-100 %] 95 % (10/10 0600) Weight:  [79.4 kg] 79.4 kg (10/10 0500) Last BM Date : 07/14/22  Weight change: Filed Weights   07/19/22 0500 07/20/22 0500 07/21/22 0500  Weight: 76.4 kg 79.1 kg 79.4 kg    Intake/Output:   Intake/Output Summary (Last 24 hours) at 07/21/2022 0846 Last data filed at 07/21/2022 0600 Gross per 24 hour  Intake 462.94 ml  Output 1975 ml  Net -1512.06 ml    CVP 11   Physical Exam   General:  pale, diaphoretic, sitting up in chair. No distress or respiratory difficulty  HEENT: normal Neck: supple. JVP 11 cm . Carotids 2+ bilat; no bruits. No lymphadenopathy or thryomegaly appreciated. Cor: PMI nondisplaced. Tach Irregular rate & rhythm. No rubs, gallops or murmurs. Lungs: decreased BS at the bases  Abdomen: distended, hypoactive BS No hepatosplenomegaly. No bruits or masses.  Extremities: no cyanosis, clubbing, rash, edema. RUE PICC, 1+ b/l LEE  Neuro: alert & orientedx3, cranial nerves grossly intact. moves all 4 extremities w/o difficulty. Affect pleasant   Telemetry   Afib low 100s   Labs    CBC Recent Labs    07/20/22 0419 07/21/22 0425   WBC 13.8* 13.9*  HGB 8.3* 8.6*  HCT 23.6* 25.2*  MCV 95.9 97.7  PLT 171 829   Basic Metabolic Panel Recent Labs    07/21/22 0425 07/21/22 0704  NA 126* 127*  K 6.7* 3.5  CL 89* 90*  CO2 28 28  GLUCOSE 156* 155*  BUN 16 16  CREATININE 0.87 0.92  CALCIUM 8.5* 8.5*  MG 1.9  --    Liver Function Tests No results for input(s): "AST", "ALT", "ALKPHOS", "BILITOT", "PROT", "ALBUMIN" in the last 72 hours.  BNP: BNP (last 3 results) Recent Labs    06/25/22 1829  BNP 436.2*     Imaging  reviewed  Medications:     Scheduled Medications:  apixaban  5 mg Oral BID   aspirin EC  81 mg Oral Daily   atorvastatin  80 mg Oral Daily   Chlorhexidine Gluconate Cloth  6 each Topical Daily   metoCLOPramide (REGLAN) injection  10 mg Intravenous Q6H   midodrine  10 mg Oral TID WC   pantoprazole  40 mg Oral Daily   polyethylene glycol  17 g Oral Daily   potassium chloride  40 mEq Oral Once   senna-docusate  1 tablet Oral BID   sodium chloride flush  10-40 mL Intracatheter Q12H   sodium chloride flush  10-40 mL Intracatheter Q12H   sodium  chloride flush  3 mL Intravenous Q12H   tamsulosin  0.4 mg Oral BID    Infusions:  amiodarone 30 mg/hr (07/21/22 0600)   norepinephrine (LEVOPHED) Adult infusion Stopped (07/17/22 1016)   promethazine (PHENERGAN) injection (IM or IVPB)      PRN Medications: hydrocortisone, hydrocortisone, melatonin, morphine injection, ondansetron (ZOFRAN) IV, mouth rinse, promethazine (PHENERGAN) injection (IM or IVPB), sodium chloride flush, sodium chloride flush, sodium chloride flush, traMADol    Patient Profile  66 y/o male recently found to have severe LV dysfunction in setting of markedly dilated aortic root (67 mm) and severe AI (tricuspid AoV) and single vessel CAD, now s/p Bentall and CABG x 1 w/ SVG-RCA. AHF team consulted to assist w/ post-operative management of systolic heart failure.     Assessment/Plan   Ascending Aortic Aneurysm/ Severe  AI  - CTA  w/ severe dilation at the sinus of Valsalva, 67 mm. Moderate dilation of mid ascending aorta, 44 mm. Tricuspid AoV - s/p Bentall with bioprosthetic aortic valve 9/27 - intraoperative TEE, no perivalvular leak post repair  - Bedside echo 10/4 with no evidence of pericardial effusion. EF < 20%, RV depressed, AVR okay.   2. CAD - single vessel, 60-70% pRCA  - s/p CABG x 1, SVG- RCA  - ASA + statin  - hold initiation of ? blocker for now.  - No chest pain   3. Chronic Systolic Heart Failure  - newly diagnosed; Echo 9/23 EF 25-30%, LV severely dilated, severe AI, Global HK, RV ok  - NICM, severity out of proportion to degree of CAD  - likely valvular CM in setting of severe AI +/- LBBB CM  - now post op bioprosthetic AVR + CABG. Intraoperative TEE LVEF 20-25%, RV ok - With junctional rhythm, will hold off on beta blocker and digoxin for now to avoid worsening A-V conduction. Eventual CRT-D (see below).  - Hypotensive 10/04 in setting of urinary retention. Bedside echo 10/4 with no evidence of pericardial effusion. EF < 20%, RV depressed, AVR okay. - Co-ox 54%, Repeat pending.   - GDMT limited. On midodrine 10 mg three times a day for BP support  - CVP 11. Wt up. Na low. Continue IV Lasix 80 mg x 1 - Plan Repeat echo today   4. Junctional rhythm - Resolved  - Has epicardial LV lead. EP following. Had been considering CRT-D prior to discharge but given need for DC-CV and prolonged hospitalization likely better wait at least several weeks until he recovers   5. LBBB -Will eventually need epicardial lead hooked up for CRT see discussion above   6. Anemia - Post-op, 1 u RBCs 10/04. Hgb 8.6   7. Thrombocytopenia - Resolved.  8. Atrial fib/flutter, post-op - Continue amio and heparin, plan for DCCV  this week.  - Eliquis 5 mg bid   9. Urinary retention - Placed foley - continue Flomax - UA okay - Will need voiding trials soon   10. Post-op ileus - severe - check KUB   - continue Reglan.   11. Hypokalemia - K 3.6. Supp K   12. Hyponatremia - Na 127  - limit free water - if drops any further -> tolvaptan - continue IV Lasix   13. Constipation  - advance bowel regimen, d/w pharmacy  - add suppository    Length of Stay: [address] PA-C  8:46 AM  Patient seen and examined with the above-signed Advanced Practice Provider and/or Housestaff. I personally reviewed laboratory data,  imaging studies and relevant notes. I independently examined the patient and formulated the important aspects of the plan. I have edited the note to reflect any of my changes or salient points. I have personally discussed the plan with the patient and/or family.  Feels ok. Foley out yesterday and passing good urine.   Remains in AF despite IV amio. Heparin switched to elliqus last night  Still no BM. Tolerating diet and passing more gas this am.   No CP or SOB  KUB c/w ileus (viewed personally)  Echo this am EF 25% RV function improving. Moderate to severe MR (in setting of LBBB) AVR ok   General:  Weak appearing. No resp difficulty HEENT: normal Neck: supple. JVP 10 Carotids 2+ bilat; no bruits. No lymphadenopathy or thryomegaly appreciated. Cor: PMI nondisplaced. Irregular rate & rhythm. No rubs, gallops or murmurs. Lungs: clear Abdomen: soft, nontender, + distended. No hepatosplenomegaly. No bruits or masses. Improved bowel sounds. Extremities: no cyanosis, clubbing, rash, trace edema Neuro: alert & orientedx3, cranial nerves grossly intact. moves all 4 extremities w/o difficulty. Affect pleasant  He continues to struggle with post-op ileus but bowel sounds improved on exam. Will continue aggressive bowel regimen and ambulation   Will need TEE/DC-CV in next day or two pending endo schedule.   Watch electrolytes. Limit free H2O   Agree with IV lasix as weight going back up. Will give 80 IV bid today. No metolazone with hyponatremia.   Glori Bickers, MD  11:11 AM

## 2022-07-22 DIAGNOSIS — Z95828 Presence of other vascular implants and grafts: Secondary | ICD-10-CM | POA: Diagnosis not present

## 2022-07-22 DIAGNOSIS — I719 Aortic aneurysm of unspecified site, without rupture: Secondary | ICD-10-CM | POA: Diagnosis not present

## 2022-07-22 DIAGNOSIS — I5022 Chronic systolic (congestive) heart failure: Secondary | ICD-10-CM | POA: Diagnosis not present

## 2022-07-22 LAB — BASIC METABOLIC PANEL
Anion gap: 10 (ref 5–15)
BUN: 15 mg/dL (ref 8–23)
CO2: 28 mmol/L (ref 22–32)
Calcium: 8.3 mg/dL — ABNORMAL LOW (ref 8.9–10.3)
Chloride: 89 mmol/L — ABNORMAL LOW (ref 98–111)
Creatinine, Ser: 0.89 mg/dL (ref 0.61–1.24)
GFR, Estimated: >60 mL/min (ref >60)
Glucose, Bld: 122 mg/dL — ABNORMAL HIGH (ref 70–99)
Potassium: 3.7 mmol/L (ref 3.5–5.1)
Sodium: 127 mmol/L — ABNORMAL LOW (ref 135–145)

## 2022-07-22 LAB — CBC
HCT: 24.3 % — ABNORMAL LOW (ref 39.0–52.0)
Hemoglobin: 8.4 g/dL — ABNORMAL LOW (ref 13.0–17.0)
MCH: 32.9 pg (ref 26.0–34.0)
MCHC: 34.6 g/dL (ref 30.0–36.0)
MCV: 95.3 fL (ref 80.0–100.0)
Platelets: 231 10*3/uL (ref 150–400)
RBC: 2.55 MIL/uL — ABNORMAL LOW (ref 4.22–5.81)
RDW: 14.6 % (ref 11.5–15.5)
WBC: 12.6 10*3/uL — ABNORMAL HIGH (ref 4.0–10.5)
nRBC: 0 % (ref 0.0–0.2)

## 2022-07-22 LAB — COOXEMETRY PANEL
Carboxyhemoglobin: 2.1 % — ABNORMAL HIGH (ref 0.5–1.5)
Methemoglobin: 1.1 % (ref 0.0–1.5)
O2 Saturation: 59.2 %
Total hemoglobin: 8.5 g/dL — ABNORMAL LOW (ref 12.0–16.0)

## 2022-07-22 LAB — PROTIME-INR
INR: 1.5 — ABNORMAL HIGH (ref 0.8–1.2)
Prothrombin Time: 18.3 seconds — ABNORMAL HIGH (ref 11.4–15.2)

## 2022-07-22 LAB — MAGNESIUM: Magnesium: 2 mg/dL (ref 1.7–2.4)

## 2022-07-22 MED ORDER — BISACODYL 10 MG RE SUPP
10.0000 mg | Freq: Once | RECTAL | Status: AC
  Administered 2022-07-22: 10 mg via RECTAL
  Filled 2022-07-22: qty 1

## 2022-07-22 MED ORDER — SORBITOL 70 % SOLN
30.0000 mL | Freq: Once | Status: AC
  Administered 2022-07-22: 30 mL via ORAL
  Filled 2022-07-22: qty 30

## 2022-07-22 MED ORDER — POTASSIUM CHLORIDE CRYS ER 20 MEQ PO TBCR
40.0000 meq | EXTENDED_RELEASE_TABLET | Freq: Once | ORAL | Status: AC
  Administered 2022-07-22: 40 meq via ORAL
  Filled 2022-07-22: qty 2

## 2022-07-22 MED ORDER — SODIUM CHLORIDE 0.9 % IV SOLN: INTRAVENOUS | Status: DC

## 2022-07-22 NOTE — Progress Notes (Signed)
   Plan for TEE/DC-CV tomorrow at 7:30 am.   Darrick Grinder NP-C  12:34 PM

## 2022-07-22 NOTE — Progress Notes (Signed)
[address]       [place] [address]             [phone]      14 Days Post-Op  Procedure(s) (LRB): BENTALL PROCEDURE USING RESILIA KONECT 29MM CONDUIT (N/A) CORONARY ARTERY BYPASS GRAFTING (CABG) X 1, USING ENDOSCOPICALLY HARVESTED RIGHT LEG GREATER SAPHENOUS VEIN (N/A) TRANSESOPHAGEAL ECHOCARDIOGRAM (TEE) (N/A) EPICARDIAL PACING LEAD PLACEMENT (N/A)   Total Length of Stay:  LOS: 14 days    SUBJECTIVE: Slept well with good urine output overnight Has no abd complaints this am Still no BM  Vitals:   07/22/22 0400 07/22/22 0500  BP: 97/74 104/83  Pulse: 98 (!) 101  Resp: 19 19  Temp:    SpO2: 91% 92%    Intake/Output      10/10 0701 10/11 0700   I.V. (mL/kg) 534.7 (6.7)   IV Piggyback 100   Total Intake(mL/kg) 634.7 (8)   Urine (mL/kg/hr) 1700 (0.9)   Total Output 1700   Net -1065.3       Urine Occurrence 1 x       sodium chloride 10 mL/hr at 07/22/22 0500   amiodarone 30 mg/hr (07/22/22 0500)   norepinephrine (LEVOPHED) Adult infusion Stopped (07/17/22 1016)   promethazine (PHENERGAN) injection (IM or IVPB)      CBC    Component Value Date/Time   WBC 12.6 (H) 07/22/2022 0437   RBC 2.55 (L) 07/22/2022 0437   HGB 8.4 (L) 07/22/2022 0437   HGB 14.8 09/30/2018 1524   HCT 24.3 (L) 07/22/2022 0437   HCT 42.2 09/30/2018 1524   PLT 231 07/22/2022 0437   PLT 231 09/30/2018 1524   MCV 95.3 07/22/2022 0437   MCV 101 (H) 09/30/2018 1524   MCH 32.9 07/22/2022 0437   MCHC 34.6 07/22/2022 0437   RDW 14.6 07/22/2022 0437   RDW 12.8 09/30/2018 1524   LYMPHSABS 1.9 06/25/2022 1829   MONOABS 0.5 06/25/2022 1829   EOSABS 0.1 06/25/2022 1829   BASOSABS 0.1 06/25/2022 1829   CMP     Component Value Date/Time   NA 127 (L) 07/22/2022 0437   NA 141 10/11/2019 1521   K 3.7 07/22/2022 0437   CL 89 (L) 07/22/2022 0437   CO2 28 07/22/2022 0437   GLUCOSE 122 (H) 07/22/2022 0437   BUN 15 07/22/2022 0437   BUN 13 10/11/2019 1521    CREATININE 0.89 07/22/2022 0437   CALCIUM 8.3 (L) 07/22/2022 0437   PROT 5.5 (L) 07/15/2022 1604   ALBUMIN 2.5 (L) 07/15/2022 1604   AST 32 07/15/2022 1604   ALT 25 07/15/2022 1604   ALKPHOS 48 07/15/2022 1604   BILITOT 1.0 07/15/2022 1604   GFRNONAA >60 07/22/2022 0437   GFRAA 108 10/11/2019 1521   ABG    Component Value Date/Time   PHART 7.342 (L) 07/10/2022 0546   PCO2ART 43.3 07/10/2022 0546   PO2ART 71 (L) 07/10/2022 0546   HCO3 23.4 07/10/2022 0546   TCO2 30 07/16/2022 0711   ACIDBASEDEF 2.0 07/10/2022 0546   O2SAT 59.2 07/22/2022 0447   CBG (last 3)  No results for input(s): "GLUCAP" in the last 72 hours. EXAM Lungs: crackles at bases Card: IRR with no murmur Abd: distended, hypoactive bs, nontender Ext: 1+ edema  ASSESSMENT: SP Bental Hemodynamics stable. Still in afib with plans for cardioversion today per nursing Coox 59 this am  Hyponatremia and CHF  Will discss with AHF team the trial of Tolvaptan to continue diuresis and  help on Na level  Ileus  Ongoing with passing of gas but no BM. On neostigmine Will give another suppository and if still nothing will use enema after cardioversion   Coralie Common, MD '@DATE'$ @

## 2022-07-22 NOTE — Progress Notes (Addendum)
Patient ID: Joe [name], male   DOB: 1956-07-19, 66 y.o.   MRN: [id number]     Advanced Heart Failure Rounding Note  PCP-Cardiologist: Skeet Latch, MD   Subjective:   9/27 s/p Bentall with bioprosthetic aortic valve   Yesterday diuresed with IV  lasix. Negative > 1.9.   KUB c/w ileus. Started on neostigmine.   CO-OX stable 59%.   Complaining of nausea.   Objective:   Weight Range: 79.1 kg Body mass index is 27.31 kg/m.   Vital Signs:   Temp:  [97.7 F (36.5 C)-98 F (36.7 C)] 97.8 F (36.6 C) (10/10 1622) Pulse Rate:  [83-208] 96 (10/11 0600) Resp:  [12-27] 17 (10/11 0600) BP: (78-128)/(61-88) 96/66 (10/11 0600) SpO2:  [91 %-97 %] 94 % (10/11 0600) Weight:  [79.1 kg] 79.1 kg (10/11 0500) Last BM Date : 07/14/22  Weight change: Filed Weights   07/20/22 0500 07/21/22 0500 07/22/22 0500  Weight: 79.1 kg 79.4 kg 79.1 kg    Intake/Output:   Intake/Output Summary (Last 24 hours) at 07/22/2022 0706 Last data filed at 07/22/2022 0600 Gross per 24 hour  Intake 661.39 ml  Output 1700 ml  Net -1038.61 ml    CVP 10  Physical Exam  General:. No resp difficulty HEENT: normal Neck: supple. JVP 9-10 . Carotids 2+ bilat; no bruits. No lymphadenopathy or thryomegaly appreciated. Cor: PMI nondisplaced. Irregular rate & rhythm. No rubs, gallops or murmurs. Lungs: Diminished with crackles in the bases.  Abdomen: soft, nontender, distended. No hepatosplenomegaly. No bruits or masses. Good bowel sounds. Extremities: no cyanosis, clubbing, rash, edema. RUE PICC Neuro: alert & orientedx3, cranial nerves grossly intact. moves all 4 extremities w/o difficulty. Affect flat     Telemetry    A fib 80-100s   Labs    CBC Recent Labs    07/21/22 0425 07/22/22 0437  WBC 13.9* 12.6*  HGB 8.6* 8.4*  HCT 25.2* 24.3*  MCV 97.7 95.3  PLT 193 099   Basic Metabolic Panel Recent Labs    07/21/22 0425 07/21/22 0704 07/22/22 0437  NA 126* 127* 127*  K 6.7* 3.5 3.7   CL 89* 90* 89*  CO2 '28 28 28  '$ GLUCOSE 156* 155* 122*  BUN '16 16 15  '$ CREATININE 0.87 0.92 0.89  CALCIUM 8.5* 8.5* 8.3*  MG 1.9  --  2.0   Liver Function Tests No results for input(s): "AST", "ALT", "ALKPHOS", "BILITOT", "PROT", "ALBUMIN" in the last 72 hours.  BNP: BNP (last 3 results) Recent Labs    06/25/22 1829  BNP 436.2*     Imaging  reviewed  Medications:     Scheduled Medications:  apixaban  5 mg Oral BID   aspirin EC  81 mg Oral Daily   atorvastatin  80 mg Oral Daily   bisacodyl  10 mg Rectal Once   Chlorhexidine Gluconate Cloth  6 each Topical Daily   furosemide  80 mg Intravenous BID   metoCLOPramide (REGLAN) injection  10 mg Intravenous Q6H   midodrine  10 mg Oral TID WC   neostigmine  0.25 mg Subcutaneous Q6H   pantoprazole  40 mg Oral Daily   polyethylene glycol  17 g Oral BID   potassium chloride  40 mEq Oral Once   senna-docusate  1 tablet Oral BID   sodium chloride flush  10-40 mL Intracatheter Q12H   sodium chloride flush  10-40 mL Intracatheter Q12H   sodium chloride flush  3 mL Intravenous Q12H   tamsulosin  0.4 mg  Oral BID    Infusions:  sodium chloride 10 mL/hr at 07/22/22 0600   amiodarone 30 mg/hr (07/22/22 0600)   norepinephrine (LEVOPHED) Adult infusion Stopped (07/17/22 1016)   promethazine (PHENERGAN) injection (IM or IVPB)      PRN Medications: sodium chloride, bisacodyl, hydrocortisone, hydrocortisone, melatonin, morphine injection, ondansetron (ZOFRAN) IV, mouth rinse, promethazine (PHENERGAN) injection (IM or IVPB), sodium chloride flush, sodium chloride flush, sodium chloride flush    Patient Profile  66 y/o male recently found to have severe LV dysfunction in setting of markedly dilated aortic root (67 mm) and severe AI (tricuspid AoV) and single vessel CAD, now s/p Bentall and CABG x 1 w/ SVG-RCA. AHF team consulted to assist w/ post-operative management of systolic heart failure.     Assessment/Plan   Ascending Aortic  Aneurysm/ Severe AI  - CTA  w/ severe dilation at the sinus of Valsalva, 67 mm. Moderate dilation of mid ascending aorta, 44 mm. Tricuspid AoV - s/p Bentall with bioprosthetic aortic valve 9/27 - intraoperative TEE, no perivalvular leak post repair  - Echo 10/10 EF 25% RV function improving. Moderate to severe MR (in setting of LBBB) AVR ok     2. CAD - single vessel, 60-70% pRCA  - s/p CABG x 1, SVG- RCA  - ASA + statin  - hold initiation of ? blocker for now.  - No chest pain   3. Chronic Systolic Heart Failure  - newly diagnosed; Echo 9/23 EF 25-30%, LV severely dilated, severe AI, Global HK, RV ok  - NICM, severity out of proportion to degree of CAD  - likely valvular CM in setting of severe AI +/- LBBB CM  - now post op bioprosthetic AVR + CABG. Intraoperative TEE LVEF 20-25%, RV ok - With junctional rhythm, will hold off on beta blocker and digoxin for now to avoid worsening A-V conduction. Eventual CRT-D (see below).  - Hypotensive 10/04 in setting of urinary retention.  - Echo 10/9- EF 25% RV function improving. Moderate to severe MR (in setting of LBBB) AVR ok   - Co-ox 59%, stable.    - GDMT limited. On midodrine 10 mg three times a day for BP support  - CVP 10-. Give 80 mg IV lasix twice a day  and supp K.   4. Junctional rhythm - Resolved  - Has epicardial LV lead. EP following. Had been considering CRT-D prior to discharge but given need for DC-CV and prolonged hospitalization likely better wait at least several weeks until he recovers   5. LBBB -Will eventually need epicardial lead hooked up for CRT see discussion above   6. Anemia - Post-op, 1 u RBCs 10/04. Hgb 8.4  7. Thrombocytopenia - Resolved.  8. Atrial fib/flutter, post-op - Continue amio. - Eliquis 5 mg bid  - Plan TEE/DC-CV tomorrow.   9. Urinary retention - Placed foley - continue Flomax - UA okay - No issues voiding.   10. Post-op ileus - severe - 10/9 KUB c/w ileus   - continue Reglan.    11. Hypokalemia - K 3.7. Supp K   12. Hyponatremia - Na 127  - limit free water - if drops any further -> tolvaptan  13. Constipation - 10/9 KUB with ileus.   - advance bowel regimen, d/w pharmacy  - Adding suppository -Ambulate   Consult PT. Ambulate. Encouraged to use IS every hour while awake.    Length of Stay: [place], NP  7:06 AM  Patient seen and examined  with the above-signed Advanced Practice Provider and/or Housestaff. I personally reviewed laboratory data, imaging studies and relevant notes. I independently examined the patient and formulated the important aspects of the plan. I have edited the note to reflect any of my changes or salient points. I have personally discussed the plan with the patient and/or family.  Feels rundown today. Co-ox ok at 59% Diuresing well.  Remains in AF. On Eliquis. No BM yet. + passing gas  General:  Weak appearing. No resp difficulty HEENT: normal Neck: supple. JVP 10. Carotids 2+ bilat; no bruits. No lymphadenopathy or thryomegaly appreciated. Cor: PMI nondisplaced. Irregular rate & rhythm. No rubs, gallops or murmurs. Lungs: decreased at bases Abdomen: soft, nontender, mildly distended. No hepatosplenomegaly. No bruits or masses. Good bowel sounds. Extremities: no cyanosis, clubbing, rash, edema Neuro: alert & orientedx3, cranial nerves grossly intact. moves all 4 extremities w/o difficulty. Affect pleasant  Continues to improve slowly. Will continue with aggressive bowel regimen for post-op ileus. Plan TEE/DC-CV tomorrow. Continue to ambulate and encourage IS.   Glori Bickers, MD  4:12 PM

## 2022-07-22 NOTE — Progress Notes (Signed)
     [address]       [place] [address]             [phone]       EVENING ROUNDS SP BENTAL/CABG CARDIOMYOPATHY Awaiting cardioversion tomorrow Had a BM today No other complaints Good urine output Hopefully advance diet tomorrow

## 2022-07-23 ENCOUNTER — Inpatient Hospital Stay (HOSPITAL_COMMUNITY): Payer: Medicare Other | Admitting: Certified Registered"

## 2022-07-23 ENCOUNTER — Inpatient Hospital Stay (HOSPITAL_COMMUNITY): Payer: Medicare Other

## 2022-07-23 ENCOUNTER — Encounter (HOSPITAL_COMMUNITY)
Admission: RE | Disposition: A | Discharge status: Home Health | Payer: Self-pay | Source: Home / Self Care | Attending: Thoracic Surgery (Cardiothoracic Vascular Surgery)

## 2022-07-23 ENCOUNTER — Encounter (HOSPITAL_COMMUNITY): Payer: Self-pay | Admitting: Thoracic Surgery (Cardiothoracic Vascular Surgery)

## 2022-07-23 DIAGNOSIS — Z95828 Presence of other vascular implants and grafts: Secondary | ICD-10-CM | POA: Diagnosis not present

## 2022-07-23 DIAGNOSIS — I4891 Unspecified atrial fibrillation: Secondary | ICD-10-CM

## 2022-07-23 DIAGNOSIS — I2510 Atherosclerotic heart disease of native coronary artery without angina pectoris: Secondary | ICD-10-CM | POA: Diagnosis not present

## 2022-07-23 DIAGNOSIS — I10 Essential (primary) hypertension: Secondary | ICD-10-CM

## 2022-07-23 DIAGNOSIS — M1990 Unspecified osteoarthritis, unspecified site: Secondary | ICD-10-CM

## 2022-07-23 DIAGNOSIS — I4892 Unspecified atrial flutter: Secondary | ICD-10-CM | POA: Diagnosis not present

## 2022-07-23 DIAGNOSIS — I719 Aortic aneurysm of unspecified site, without rupture: Secondary | ICD-10-CM | POA: Diagnosis not present

## 2022-07-23 DIAGNOSIS — I5022 Chronic systolic (congestive) heart failure: Secondary | ICD-10-CM | POA: Diagnosis not present

## 2022-07-23 HISTORY — PX: CARDIOVERSION: SHX1299

## 2022-07-23 HISTORY — PX: TEE WITHOUT CARDIOVERSION: SHX5443

## 2022-07-23 LAB — BASIC METABOLIC PANEL
Anion gap: 10 (ref 5–15)
BUN: 13 mg/dL (ref 8–23)
CO2: 28 mmol/L (ref 22–32)
Calcium: 8 mg/dL — ABNORMAL LOW (ref 8.9–10.3)
Chloride: 90 mmol/L — ABNORMAL LOW (ref 98–111)
Creatinine, Ser: 0.84 mg/dL (ref 0.61–1.24)
GFR, Estimated: >60 mL/min (ref >60)
Glucose, Bld: 118 mg/dL — ABNORMAL HIGH (ref 70–99)
Potassium: 3.4 mmol/L — ABNORMAL LOW (ref 3.5–5.1)
Sodium: 128 mmol/L — ABNORMAL LOW (ref 135–145)

## 2022-07-23 LAB — MAGNESIUM: Magnesium: 1.9 mg/dL (ref 1.7–2.4)

## 2022-07-23 LAB — COOXEMETRY PANEL
Carboxyhemoglobin: 1.7 % — ABNORMAL HIGH (ref 0.5–1.5)
Methemoglobin: <0.7 % (ref 0.0–1.5)
O2 Saturation: 64.4 %
Total hemoglobin: 9 g/dL — ABNORMAL LOW (ref 12.0–16.0)

## 2022-07-23 SURGERY — ECHOCARDIOGRAM, TRANSESOPHAGEAL
Anesthesia: General

## 2022-07-23 MED ORDER — AMIODARONE HCL 200 MG PO TABS
200.0000 mg | ORAL_TABLET | Freq: Two times a day (BID) | ORAL | Status: DC
  Administered 2022-07-23 – 2022-07-26 (×7): 200 mg via ORAL
  Filled 2022-07-23 (×7): qty 1

## 2022-07-23 MED ORDER — MAGNESIUM SULFATE 2 GM/50ML IV SOLN
2.0000 g | Freq: Once | INTRAVENOUS | Status: AC
  Administered 2022-07-23: 2 g via INTRAVENOUS
  Filled 2022-07-23: qty 50

## 2022-07-23 MED ORDER — MINERAL OIL RE ENEM: 1.0000 | ENEMA | Freq: Once | RECTAL | Status: DC

## 2022-07-23 MED ORDER — ETOMIDATE 2 MG/ML IV SOLN
INTRAVENOUS | Status: DC | PRN
  Administered 2022-07-23: 1 mg via INTRAVENOUS
  Administered 2022-07-23: 6 mg via INTRAVENOUS

## 2022-07-23 MED ORDER — POTASSIUM CHLORIDE CRYS ER 20 MEQ PO TBCR
40.0000 meq | EXTENDED_RELEASE_TABLET | Freq: Two times a day (BID) | ORAL | Status: AC
  Administered 2022-07-23 (×2): 40 meq via ORAL
  Filled 2022-07-23 (×2): qty 2

## 2022-07-23 MED ORDER — LIDOCAINE HCL (CARDIAC) PF 100 MG/5ML IV SOSY
PREFILLED_SYRINGE | INTRAVENOUS | Status: DC | PRN
  Administered 2022-07-23: 100 mg via INTRAVENOUS

## 2022-07-23 MED ORDER — PROPOFOL 10 MG/ML IV BOLUS
INTRAVENOUS | Status: DC | PRN
  Administered 2022-07-23: 30 mg via INTRAVENOUS
  Administered 2022-07-23: 5 mg via INTRAVENOUS

## 2022-07-23 MED ORDER — PHENYLEPHRINE HCL-NACL 20-0.9 MG/250ML-% IV SOLN
INTRAVENOUS | Status: DC | PRN
  Administered 2022-07-23: 60 ug/min via INTRAVENOUS

## 2022-07-23 MED ORDER — MIDODRINE HCL 5 MG PO TABS
5.0000 mg | ORAL_TABLET | Freq: Three times a day (TID) | ORAL | Status: DC
  Administered 2022-07-23 – 2022-07-25 (×5): 5 mg via ORAL
  Filled 2022-07-23 (×5): qty 1

## 2022-07-23 NOTE — Progress Notes (Signed)
Patient ID: Joe [name], male   DOB: 07/21/1956, 66 y.o.   MRN: [id number]     Advanced Heart Failure Rounding Note  PCP-Cardiologist: Skeet Latch, MD   Subjective:   9/27 s/p Bentall with bioprosthetic aortic valve   Continues to feel weak. No BM yet but passing gas. Diuresed 3L yesterday.   Co-ox 64% Scr stable Na 128.  Remains in AF rates 80-90s On Eliquis   Objective:   Weight Range: 79.1 kg Body mass index is 27.31 kg/m.   Vital Signs:   Temp:  [96.1 F (35.6 C)-98.4 F (36.9 C)] 96.1 F (35.6 C) (10/12 0709) Pulse Rate:  [87-198] 97 (10/12 0709) Resp:  [9-30] 22 (10/12 0709) BP: (85-115)/(62-95) 113/69 (10/12 0709) SpO2:  [87 %-99 %] 91 % (10/12 0709) Weight:  [79.1 kg] 79.1 kg (10/12 0709) Last BM Date : 07/14/22  Weight change: Filed Weights   07/21/22 0500 07/22/22 0500 07/23/22 0709  Weight: 79.4 kg 79.1 kg 79.1 kg    Intake/Output:   Intake/Output Summary (Last 24 hours) at 07/23/2022 0734 Last data filed at 07/23/2022 0700 Gross per 24 hour  Intake 733.17 ml  Output 3075 ml  Net -2341.83 ml     CVP 10  Physical Exam   General:  Weak appearing. No resp difficulty HEENT: normal Neck: supple. no JVD. Carotids 2+ bilat; no bruits. No lymphadenopathy or thryomegaly appreciated. Cor: PMI nondisplaced. Irregular rate & rhythm. No rubs, gallops or murmurs. Lungs: clear Abdomen: soft, nontender, minimally distended. No hepatosplenomegaly. No bruits or masses. Good bowel sounds. Extremities: no cyanosis, clubbing, rash, edema Neuro: alert & orientedx3, cranial nerves grossly intact. moves all 4 extremities w/o difficulty. Affect pleasant  Telemetry    A fib/flutter 80-90s   Labs    CBC Recent Labs    07/21/22 0425 07/22/22 0437  WBC 13.9* 12.6*  HGB 8.6* 8.4*  HCT 25.2* 24.3*  MCV 97.7 95.3  PLT 193 073    Basic Metabolic Panel Recent Labs    07/22/22 0437 07/23/22 0532  NA 127* 128*  K 3.7 3.4*  CL 89* 90*  CO2 28 28   GLUCOSE 122* 118*  BUN 15 13  CREATININE 0.89 0.84  CALCIUM 8.3* 8.0*  MG 2.0 1.9    Liver Function Tests No results for input(s): "AST", "ALT", "ALKPHOS", "BILITOT", "PROT", "ALBUMIN" in the last 72 hours.  BNP: BNP (last 3 results) Recent Labs    06/25/22 1829  BNP 436.2*      Imaging  reviewed  Medications:     Scheduled Medications:  [MAR Hold] apixaban  5 mg Oral BID   [MAR Hold] aspirin EC  81 mg Oral Daily   [MAR Hold] atorvastatin  80 mg Oral Daily   [MAR Hold] Chlorhexidine Gluconate Cloth  6 each Topical Daily   [MAR Hold] furosemide  80 mg Intravenous BID   [MAR Hold] metoCLOPramide (REGLAN) injection  10 mg Intravenous Q6H   [MAR Hold] midodrine  10 mg Oral TID WC   [MAR Hold] pantoprazole  40 mg Oral Daily   [MAR Hold] polyethylene glycol  17 g Oral BID   [MAR Hold] senna-docusate  1 tablet Oral BID   [MAR Hold] sodium chloride flush  10-40 mL Intracatheter Q12H   [MAR Hold] sodium chloride flush  10-40 mL Intracatheter Q12H   [MAR Hold] sodium chloride flush  3 mL Intravenous Q12H   [MAR Hold] tamsulosin  0.4 mg Oral BID    Infusions:  [MAR Hold] sodium chloride  Stopped (07/22/22 1203)   sodium chloride 20 mL/hr at 07/23/22 0700   amiodarone 30 mg/hr (07/23/22 0700)   [MAR Hold] norepinephrine (LEVOPHED) Adult infusion Stopped (07/17/22 1016)   [MAR Hold] promethazine (PHENERGAN) injection (IM or IVPB)      PRN Medications: [MAR Hold] sodium chloride, [MAR Hold] bisacodyl, [MAR Hold] hydrocortisone, [MAR Hold] hydrocortisone, [MAR Hold] melatonin, [MAR Hold]  morphine injection, [MAR Hold] ondansetron (ZOFRAN) IV, [MAR Hold] mouth rinse, [MAR Hold] promethazine (PHENERGAN) injection (IM or IVPB), [MAR Hold] sodium chloride flush, [MAR Hold] sodium chloride flush, [MAR Hold] sodium chloride flush    Patient Profile  66 y/o male recently found to have severe LV dysfunction in setting of markedly dilated aortic root (67 mm) and severe AI  (tricuspid AoV) and single vessel CAD, now s/p Bentall and CABG x 1 w/ SVG-RCA. AHF team consulted to assist w/ post-operative management of systolic heart failure.     Assessment/Plan   Ascending Aortic Aneurysm/ Severe AI  - CTA  w/ severe dilation at the sinus of Valsalva, 67 mm. Moderate dilation of mid ascending aorta, 44 mm. Tricuspid AoV - s/p Bentall with bioprosthetic aortic valve 9/27 - intraoperative TEE, no perivalvular leak post repair  - Echo 10/10 EF 25% RV function improving. Moderate to severe MR (in setting of LBBB) AVR ok     2. CAD - single vessel, 60-70% pRCA  - s/p CABG x 1, SVG- RCA  - ASA + statin  - hold initiation of ? blocker for now.  - No chest pain   3. Chronic Systolic Heart Failure  - newly diagnosed; Echo 9/23 EF 25-30%, LV severely dilated, severe AI, Global HK, RV ok  - NICM, severity out of proportion to degree of CAD  - likely valvular CM in setting of severe AI +/- LBBB CM  - now post op bioprosthetic AVR + CABG. Intraoperative TEE LVEF 20-25%, RV ok - With junctional rhythm, will hold off on beta blocker and digoxin for now to avoid worsening A-V conduction. Eventual CRT-D (see below).  - Hypotensive 10/04 in setting of urinary retention.  - Echo 10/9- EF 25% RV function improving. Moderate to severe MR (in setting of LBBB) AVR ok   - Co-ox 64%, stable.    - GDMT limited. On midodrine 10 mg three times a day for BP support. Can wean slowly as tolerated -JVP 8-9. Continue IVdiuresis one more day  4. Junctional rhythm - Resolved  - Has epicardial LV lead. EP following. Had been considering CRT-D prior to discharge but given need for DC-CV and prolonged hospitalization likely better wait at least several weeks until he recovers   5. LBBB -Will eventually need epicardial lead hooked up for CRT see discussion above   6. Anemia - Post-op, 1 u RBCs 10/04. Hgb 8.4  7. Thrombocytopenia - Resolved.  8. Atrial fib/flutter, post-op - Continue  amio. - Eliquis 5 mg bid  - Plan TEE/DC-CV today  9. Urinary retention - Placed foley - continue Flomax - UA okay - Foley out. No issues voiding.   10. Post-op ileus - severe - 10/9 KUB c/w ileus   - continue Reglan.   11. Hypokalemia - K 3.4. Supp K   12. Hyponatremia - Na 128 - limit free water\  13. Constipation - 10/9 KUB with ileus.   - advance bowel regimen, d/w pharmacy  -Ambulate - Consider enema   Consult PT. Ambulate. Encouraged to use IS every hour while awake.    Length of  Stay: 15   Glori Bickers, MD  7:34 AM

## 2022-07-23 NOTE — Progress Notes (Signed)
Echocardiogram Echocardiogram Transesophageal has been performed.  Fidel Levy 07/23/2022, 8:22 AM

## 2022-07-23 NOTE — Progress Notes (Signed)
EVENING ROUNDS NOTE :     [address]       [place] [address]             [phone]                 Day of Surgery Procedure(s) (LRB): TRANSESOPHAGEAL ECHOCARDIOGRAM (TEE) (N/A) CARDIOVERSION (N/A)   Total Length of Stay:  LOS: 15 days  Events:   No events Stable day    BP 95/65   Pulse 62   Temp 98.1 F (36.7 C) (Oral)   Resp (!) 21   Ht '5\' 7"'$  (1.702 m)   Wt 79.1 kg   SpO2 94%   BMI 27.31 kg/m   CVP:  [5 mmHg-18 mmHg] 5 mmHg      sodium chloride Stopped (07/22/22 1203)   promethazine (PHENERGAN) injection (IM or IVPB)      I/O last 3 completed shifts: In: 1024.5 [I.V.:1024.5] Out: 3875 [Urine:3875]      Latest Ref Rng & Units 07/22/2022    4:37 AM 07/21/2022    4:25 AM 07/20/2022    4:19 AM  CBC  WBC 4.0 - 10.5 K/uL 12.6  13.9  13.8   Hemoglobin 13.0 - 17.0 g/dL 8.4  8.6  8.3   Hematocrit 39.0 - 52.0 % 24.3  25.2  23.6   Platelets 150 - 400 K/uL 231  193  171        Latest Ref Rng & Units 07/23/2022    5:32 AM 07/22/2022    4:37 AM 07/21/2022    7:04 AM  BMP  Glucose 70 - 99 mg/dL 118  122  155   BUN 8 - 23 mg/dL '13  15  16   '$ Creatinine 0.61 - 1.24 mg/dL 0.84  0.89  0.92   Sodium 135 - 145 mmol/L 128  127  127   Potassium 3.5 - 5.1 mmol/L 3.4  3.7  3.5   Chloride 98 - 111 mmol/L 90  89  90   CO2 22 - 32 mmol/L '28  28  28   '$ Calcium 8.9 - 10.3 mg/dL 8.0  8.3  8.5     ABG    Component Value Date/Time   PHART 7.342 (L) 07/10/2022 0546   PCO2ART 43.3 07/10/2022 0546   PO2ART 71 (L) 07/10/2022 0546   HCO3 23.4 07/10/2022 0546   TCO2 30 07/16/2022 0711   ACIDBASEDEF 2.0 07/10/2022 0546   O2SAT 64.4 07/23/2022 0532       Virgie Kunda, MD 07/23/2022 5:58 PM

## 2022-07-23 NOTE — CV Procedure (Signed)
   TRANSESOPHAGEAL ECHOCARDIOGRAM GUIDED DIRECT CURRENT CARDIOVERSION  NAME:  Joe [name]   MRN: [id number] DOB:  01-17-1956   ADMIT DATE: 07/08/2022  INDICATIONS:  Atrial flutter  PROCEDURE:   Informed consent was obtained prior to the procedure. The risks, benefits and alternatives for the procedure were discussed and the patient comprehended these risks.  Risks include, but are not limited to, cough, sore throat, vomiting, nausea, somnolence, esophageal and stomach trauma or perforation, bleeding, low blood pressure, aspiration, pneumonia, infection, trauma to the teeth and death.    After a procedural time-out, the oropharynx was anesthetized and the patient was sedated by the anesthesia service. The transesophageal probe was inserted in the esophagus and stomach without difficulty and multiple views were obtained.   FINDINGS:  LEFT VENTRICLE: EF = 20-25%  RIGHT VENTRICLE: Mildly HK  LEFT ATRIUM: Dilated  LEFT ATRIAL APPENDAGE: No clot  RIGHT ATRIUM: Normal  AORTIC VALVE:  s/p bioprosthesis no AI  MITRAL VALVE:    Normal trivial MR  TRICUSPID VALVE: normal mild TR  PULMONIC VALVE: normal trivial PR  INTERATRIAL SEPTUM: No PFO/ASD  PERICARDIUM: No effusion  DESCENDING AORTA: mild plaque   CARDIOVERSION:     Indications:  Atrial Flutter  Procedure Details:  Once the TEE was complete, the patient had the defibrillator pads placed in the anterior and posterior position. Once an appropriate level of sedation was achieved, the patient received a single biphasic, synchronized 200J shock with prompt conversion to sinus rhythm. No apparent complications.   Glori Bickers, MD  8:57 PM

## 2022-07-23 NOTE — Anesthesia Postprocedure Evaluation (Signed)
Anesthesia Post Note  Patient: Joe [name]  Procedure(s) Performed: TRANSESOPHAGEAL ECHOCARDIOGRAM (TEE) CARDIOVERSION     Patient location during evaluation: PACU Anesthesia Type: General Level of consciousness: awake Pain management: pain level controlled Vital Signs Assessment: post-procedure vital signs reviewed and stable Respiratory status: spontaneous breathing Cardiovascular status: stable Postop Assessment: no apparent nausea or vomiting Anesthetic complications: no   No notable events documented.  Last Vitals:  Vitals:   07/23/22 0815 07/23/22 0830  BP: 93/61 98/66  Pulse: (!) 55 60  Resp: 17 17  Temp:  36.9 C  SpO2: 97% 92%    Last Pain:  Vitals:   07/23/22 0830  TempSrc:   PainSc: 0-No pain                 Huston Foley

## 2022-07-23 NOTE — Evaluation (Signed)
Physical Therapy Evaluation Patient Details Name: Joe [name] MRN: [id number] DOB: 1956/07/21 Today's Date: 07/23/2022  History of Present Illness  66 yo male admitted 9/27 for ascending aortic arch replacement with Bentall procedure and CABGx1 same date. Extubated 9/28. Ileus, Afib and CHF post procedure. PMHx: congenital brain damage, speech delay, bil THA, PLIF, Lt TSA, PVC   Clinical Impression  Pt presents with an overall decrease in functional mobility secondary to above. PTA, pt independent with mobility, performing physical activity several times a week. Mother was primary caretaker for IADLs. Today, pt able to perform functional mobility with RW and frequent cuing of sternal precautions due to poor carryover. Significant bowel movement upon initial sit to stand transfer. Educ on importance of mobility. Pt would benefit from continued acute PT services to maximize functional mobility and independence prior to d/c home with [facility] PT and mother as caretaker 24/7.  Premobility vitals: 93% SpO2 on 2L [place]  BP 120/76 HR 62 bpm     Recommendations for follow up therapy are one component of a multi-disciplinary discharge planning process, led by the attending physician.  Recommendations may be updated based on patient status, additional functional criteria and insurance authorization.  Follow Up Recommendations Home health PT      Assistance Recommended at Discharge Frequent or constant Supervision/Assistance  Patient can return home with the following  A little help with walking and/or transfers;A little help with bathing/dressing/bathroom;Help with stairs or ramp for entrance;Assist for transportation;Assistance with feeding    Equipment Recommendations None recommended by PT  Recommendations for Other Services       Functional Status Assessment Patient has had a recent decline in their functional status and demonstrates the ability to make significant improvements in function in a  reasonable and predictable amount of time.     Precautions / Restrictions Precautions Precautions: Sternal;Fall Precaution Comments: frequent cuing for sternal precautions due to poor carryover      Mobility  Bed Mobility Overal bed mobility: Needs Assistance Bed Mobility: Supine to Sit     Supine to sit: Min assist, HOB elevated     General bed mobility comments: min A for trunk elevation once sidelying and frequent verbal cues to maintain sternal precautions    Transfers Overall transfer level: Needs assistance Equipment used: Standard walker Transfers: Sit to/from Stand, Bed to chair/wheelchair/BSC Sit to Stand: Min assist, Min guard   Step pivot transfers: Min guard       General transfer comment: sit to stand intitally min assist, progress to min guard once carryover of move in tube instructions occurred    Ambulation/Gait Ambulation/Gait assistance: Min guard Gait Distance (Feet): 270 Feet Assistive device: Rolling walker (2 wheels) Gait Pattern/deviations: Decreased stride length, Trunk flexed, Step-to pattern Gait velocity: decreased     General Gait Details: dec heel weight bearing, 3 standing rest breaks for pt to catch breath, verbal cues breathing sequence for energy conservation  Stairs            Wheelchair Mobility    Modified Rankin (Stroke Patients Only)       Balance Overall balance assessment: Needs assistance Sitting-balance support: Feet supported, No upper extremity supported Sitting balance-Leahy Scale: Fair Sitting balance - Comments: prolonged sitting EOB supevision during precaution education   Standing balance support: Bilateral upper extremity supported Standing balance-Leahy Scale: Fair Standing balance comment: B UEs on RW with intermittent single hand support only during standing rest breaks  Pertinent Vitals/Pain Pain Assessment Pain Assessment: No/denies pain    Home  Living Family/patient expects to be discharged to:: Private residence Living Arrangements: Parent Available Help at Discharge: Family;Available 24 hours/day Type of Home: House Home Access: Stairs to enter Entrance Stairs-Rails: None Entrance Stairs-Number of Steps: 1 Alternate Level Stairs-Number of Steps: 12 can stay downstairs Home Layout: Two level;Able to live on main level with bedroom/bathroom;Full bath on main level Home Equipment: Conservation officer, nature (2 wheels);BSC/3in1      Prior Function Prior Level of Function : Independent/Modified Independent             Mobility Comments: pt previously riding bicycle and going to gym 3x a week ADLs Comments: pt baseline is assistance from mother for IADLS     Hand Dominance        Extremity/Trunk Assessment   Upper Extremity Assessment Upper Extremity Assessment: Generalized weakness (functional strength for RW grip and negotiation)    Lower Extremity Assessment Lower Extremity Assessment: Generalized weakness    Cervical / Trunk Assessment Cervical / Trunk Assessment: Kyphotic  Communication   Communication: No difficulties  Cognition Arousal/Alertness: Awake/alert Behavior During Therapy: WFL for tasks assessed/performed Overall Cognitive Status: Impaired/Different from baseline Area of Impairment: Orientation, Attention, Following commands                 Orientation Level: Time, Situation Current Attention Level: Sustained   Following Commands: Follows one step commands consistently, Follows multi-step commands inconsistently, Follows one step commands with increased time       General Comments: pt baseline is disoriented to time, day of week, year        General Comments General comments (skin integrity, edema, etc.): pt had bowel movement upon initial ascent after being provided meds to clear ileus, pts mom is primary caretaker and highly supportive    Exercises     Assessment/Plan    PT  Assessment Patient needs continued PT services  PT Problem List Decreased strength;Decreased activity tolerance;Decreased balance;Decreased mobility;Decreased safety awareness;Cardiopulmonary status limiting activity       PT Treatment Interventions DME instruction;Balance training;Gait training;Functional mobility training;Patient/family education;Therapeutic activities;Therapeutic exercise    PT Goals (Current goals can be found in the Care Plan section)  Acute Rehab PT Goals Patient Stated Goal: to get back to hobbies PT Goal Formulation: With patient/family Time For Goal Achievement: 08/06/22 Potential to Achieve Goals: Good    Frequency Min 3X/week     Co-evaluation               AM-PAC PT "6 Clicks" Mobility  Outcome Measure Help needed turning from your back to your side while in a flat bed without using bedrails?: None Help needed moving from lying on your back to sitting on the side of a flat bed without using bedrails?: A Little Help needed moving to and from a bed to a chair (including a wheelchair)?: A Little Help needed standing up from a chair using your arms (e.g., wheelchair or bedside chair)?: A Little Help needed to walk in hospital room?: A Little Help needed climbing 3-5 steps with a railing? : A Lot 6 Click Score: 18    End of Session Equipment Utilized During Treatment: Gait belt;Oxygen (2L [place]) Activity Tolerance: Patient tolerated treatment well Patient left: in chair;with call bell/phone within reach;with chair alarm set;with family/visitor present Nurse Communication: Mobility status;Other (comment) (bowel movement) PT Visit Diagnosis: Other abnormalities of gait and mobility (R26.89);Muscle weakness (generalized) (M62.81);Unsteadiness on feet (R26.81)    Time: 7510-2585  PT Time Calculation (min) (ACUTE ONLY): 43 min   Charges:   PT Evaluation $PT Eval Moderate Complexity: 1 Mod PT Treatments $Gait Training: 8-22 mins $Therapeutic Activity:  8-22 mins       Chipper Oman, SPT   [place] Mayia Megill 07/23/2022, 11:40 AM

## 2022-07-23 NOTE — Interval H&P Note (Signed)
History and Physical Interval Note:  07/23/2022 7:38 AM  Joe [name]  has presented today for surgery, with the diagnosis of afib.  The various methods of treatment have been discussed with the patient and family. After consideration of risks, benefits and other options for treatment, the patient has consented to  Procedure(s): TRANSESOPHAGEAL ECHOCARDIOGRAM (TEE) (N/A) CARDIOVERSION (N/A) as a surgical intervention.  The patient's history has been reviewed, patient examined, no change in status, stable for surgery.  I have reviewed the patient's chart and labs.  Questions were answered to the patient's satisfaction.     Torris House

## 2022-07-23 NOTE — Anesthesia Preprocedure Evaluation (Signed)
Anesthesia Evaluation  Patient identified by MRN, date of birth, ID band Patient awake    Reviewed: Allergy & Precautions, NPO status , Patient's Chart, lab work & pertinent test results  History of Anesthesia Complications Negative for: history of anesthetic complications  Airway Mallampati: I  TM Distance: >3 FB Neck ROM: Full    Dental  (+) Dental Advisory Given, Poor Dentition   Pulmonary neg pulmonary ROS,    Pulmonary exam normal        Cardiovascular hypertension, Pt. on medications + CAD  + dysrhythmias Atrial Fibrillation + Valvular Problems/Murmurs AI  Rhythm:Regular Rate:Normal + Diastolic murmurs  '23 Cath - 1.  Moderate proximal RCA stenosis and an angulated portion of the vessel, estimated at 60 to 70% 2.  Patent left main, LAD, and left circumflex with mild luminal irregularities 3.  Low left and right heart filling pressures with preserved cardiac output  '23 TTE - EF 25 to 30%. Global hypokinesis. The left ventricular internal cavity size was severely dilated. There is moderate concentric left ventricular hypertrophy. Grade I diastolic dysfunction (impaired relaxation). Left atrial size was severely dilated. Right atrial size was mildly dilated. Mild mitral valve regurgitation. Aortic valve regurgitation is severe. There is diatsolic flow reversal (aortic arch asssessment). Eccentric jet related to root and Sinus of Valsalva dialtion. Vena contracta 0.8 mm. Pulmonic valve regurgitation is moderate. There is severe dilatation of the aortic root, measuring 65 mm. There is moderate dilatation of the ascending aorta, measuring 45 mm. Cannot exclude a small PFO.      Neuro/Psych PSYCHIATRIC DISORDERS Anxiety  Cognitive deficits, independent with ADLs Expressive speech delay     GI/Hepatic negative GI ROS, Neg liver ROS,   Endo/Other  negative endocrine ROS  Renal/GU negative Renal ROS      Musculoskeletal  (+) Arthritis ,   Abdominal Normal abdominal exam  (+)   Peds  Hematology negative hematology ROS (+)   Anesthesia Other Findings   Reproductive/Obstetrics                             Anesthesia Physical  Anesthesia Plan  ASA: 3  Anesthesia Plan: General   Post-op Pain Management:    Induction: Intravenous  PONV Risk Score and Plan: 2 and Treatment may vary due to age or medical condition  Airway Management Planned: Natural Airway and Mask  Additional Equipment:   Intra-op Plan:   Post-operative Plan:   Informed Consent: I have reviewed the patients History and Physical, chart, labs and discussed the procedure including the risks, benefits and alternatives for the proposed anesthesia with the patient or authorized representative who has indicated his/her understanding and acceptance.     Dental advisory given and Consent reviewed with POA  Plan Discussed with: CRNA  Anesthesia Plan Comments: (PAT note written 07/07/2022 by Myra Gianotti, PA-C. )        Anesthesia Quick Evaluation

## 2022-07-23 NOTE — Transfer of Care (Signed)
Immediate Anesthesia Transfer of Care Note  Patient: Joe [name]  Procedure(s) Performed: TRANSESOPHAGEAL ECHOCARDIOGRAM (TEE) CARDIOVERSION  Patient Location: PACU  Anesthesia Type:MAC  Level of Consciousness: drowsy and patient cooperative  Airway & Oxygen Therapy: Patient Spontanous Breathing and Patient connected to nasal cannula oxygen  Post-op Assessment: Report given to RN and Post -op Vital signs reviewed and stable  Post vital signs: Reviewed and stable  Last Vitals:  Vitals Value Taken Time  BP 90/63 07/23/22 0802  Temp 36.9 C 07/23/22 0800  Pulse 57 07/23/22 0805  Resp 21 07/23/22 0805  SpO2 91 % 07/23/22 0805  Vitals shown include unvalidated device data.  Last Pain:  Vitals:   07/23/22 0800  TempSrc:   PainSc: 0-No pain      Patients Stated Pain Goal: 0 (75/05/18 3358)  Complications: No notable events documented.

## 2022-07-23 NOTE — H&P (View-Only) (Signed)
Patient ID: Joe [name], male   DOB: September 27, 1956, 66 y.o.   MRN: [id number]     Advanced Heart Failure Rounding Note  PCP-Cardiologist: Skeet Latch, MD   Subjective:   9/27 s/p Bentall with bioprosthetic aortic valve   Continues to feel weak. No BM yet but passing gas. Diuresed 3L yesterday.   Co-ox 64% Scr stable Na 128.  Remains in AF rates 80-90s On Eliquis   Objective:   Weight Range: 79.1 kg Body mass index is 27.31 kg/m.   Vital Signs:   Temp:  [96.1 F (35.6 C)-98.4 F (36.9 C)] 96.1 F (35.6 C) (10/12 0709) Pulse Rate:  [87-198] 97 (10/12 0709) Resp:  [9-30] 22 (10/12 0709) BP: (85-115)/(62-95) 113/69 (10/12 0709) SpO2:  [87 %-99 %] 91 % (10/12 0709) Weight:  [79.1 kg] 79.1 kg (10/12 0709) Last BM Date : 07/14/22  Weight change: Filed Weights   07/21/22 0500 07/22/22 0500 07/23/22 0709  Weight: 79.4 kg 79.1 kg 79.1 kg    Intake/Output:   Intake/Output Summary (Last 24 hours) at 07/23/2022 0734 Last data filed at 07/23/2022 0700 Gross per 24 hour  Intake 733.17 ml  Output 3075 ml  Net -2341.83 ml     CVP 10  Physical Exam   General:  Weak appearing. No resp difficulty HEENT: normal Neck: supple. no JVD. Carotids 2+ bilat; no bruits. No lymphadenopathy or thryomegaly appreciated. Cor: PMI nondisplaced. Irregular rate & rhythm. No rubs, gallops or murmurs. Lungs: clear Abdomen: soft, nontender, minimally distended. No hepatosplenomegaly. No bruits or masses. Good bowel sounds. Extremities: no cyanosis, clubbing, rash, edema Neuro: alert & orientedx3, cranial nerves grossly intact. moves all 4 extremities w/o difficulty. Affect pleasant  Telemetry    A fib/flutter 80-90s   Labs    CBC Recent Labs    07/21/22 0425 07/22/22 0437  WBC 13.9* 12.6*  HGB 8.6* 8.4*  HCT 25.2* 24.3*  MCV 97.7 95.3  PLT 193 502    Basic Metabolic Panel Recent Labs    07/22/22 0437 07/23/22 0532  NA 127* 128*  K 3.7 3.4*  CL 89* 90*  CO2 28 28   GLUCOSE 122* 118*  BUN 15 13  CREATININE 0.89 0.84  CALCIUM 8.3* 8.0*  MG 2.0 1.9    Liver Function Tests No results for input(s): "AST", "ALT", "ALKPHOS", "BILITOT", "PROT", "ALBUMIN" in the last 72 hours.  BNP: BNP (last 3 results) Recent Labs    06/25/22 1829  BNP 436.2*      Imaging  reviewed  Medications:     Scheduled Medications:  [MAR Hold] apixaban  5 mg Oral BID   [MAR Hold] aspirin EC  81 mg Oral Daily   [MAR Hold] atorvastatin  80 mg Oral Daily   [MAR Hold] Chlorhexidine Gluconate Cloth  6 each Topical Daily   [MAR Hold] furosemide  80 mg Intravenous BID   [MAR Hold] metoCLOPramide (REGLAN) injection  10 mg Intravenous Q6H   [MAR Hold] midodrine  10 mg Oral TID WC   [MAR Hold] pantoprazole  40 mg Oral Daily   [MAR Hold] polyethylene glycol  17 g Oral BID   [MAR Hold] senna-docusate  1 tablet Oral BID   [MAR Hold] sodium chloride flush  10-40 mL Intracatheter Q12H   [MAR Hold] sodium chloride flush  10-40 mL Intracatheter Q12H   [MAR Hold] sodium chloride flush  3 mL Intravenous Q12H   [MAR Hold] tamsulosin  0.4 mg Oral BID    Infusions:  [MAR Hold] sodium chloride  Stopped (07/22/22 1203)   sodium chloride 20 mL/hr at 07/23/22 0700   amiodarone 30 mg/hr (07/23/22 0700)   [MAR Hold] norepinephrine (LEVOPHED) Adult infusion Stopped (07/17/22 1016)   [MAR Hold] promethazine (PHENERGAN) injection (IM or IVPB)      PRN Medications: [MAR Hold] sodium chloride, [MAR Hold] bisacodyl, [MAR Hold] hydrocortisone, [MAR Hold] hydrocortisone, [MAR Hold] melatonin, [MAR Hold]  morphine injection, [MAR Hold] ondansetron (ZOFRAN) IV, [MAR Hold] mouth rinse, [MAR Hold] promethazine (PHENERGAN) injection (IM or IVPB), [MAR Hold] sodium chloride flush, [MAR Hold] sodium chloride flush, [MAR Hold] sodium chloride flush    Patient Profile  66 y/o male recently found to have severe LV dysfunction in setting of markedly dilated aortic root (67 mm) and severe AI  (tricuspid AoV) and single vessel CAD, now s/p Bentall and CABG x 1 w/ SVG-RCA. AHF team consulted to assist w/ post-operative management of systolic heart failure.     Assessment/Plan   Ascending Aortic Aneurysm/ Severe AI  - CTA  w/ severe dilation at the sinus of Valsalva, 67 mm. Moderate dilation of mid ascending aorta, 44 mm. Tricuspid AoV - s/p Bentall with bioprosthetic aortic valve 9/27 - intraoperative TEE, no perivalvular leak post repair  - Echo 10/10 EF 25% RV function improving. Moderate to severe MR (in setting of LBBB) AVR ok     2. CAD - single vessel, 60-70% pRCA  - s/p CABG x 1, SVG- RCA  - ASA + statin  - hold initiation of ? blocker for now.  - No chest pain   3. Chronic Systolic Heart Failure  - newly diagnosed; Echo 9/23 EF 25-30%, LV severely dilated, severe AI, Global HK, RV ok  - NICM, severity out of proportion to degree of CAD  - likely valvular CM in setting of severe AI +/- LBBB CM  - now post op bioprosthetic AVR + CABG. Intraoperative TEE LVEF 20-25%, RV ok - With junctional rhythm, will hold off on beta blocker and digoxin for now to avoid worsening A-V conduction. Eventual CRT-D (see below).  - Hypotensive 10/04 in setting of urinary retention.  - Echo 10/9- EF 25% RV function improving. Moderate to severe MR (in setting of LBBB) AVR ok   - Co-ox 64%, stable.    - GDMT limited. On midodrine 10 mg three times a day for BP support. Can wean slowly as tolerated -JVP 8-9. Continue IVdiuresis one more day  4. Junctional rhythm - Resolved  - Has epicardial LV lead. EP following. Had been considering CRT-D prior to discharge but given need for DC-CV and prolonged hospitalization likely better wait at least several weeks until he recovers   5. LBBB -Will eventually need epicardial lead hooked up for CRT see discussion above   6. Anemia - Post-op, 1 u RBCs 10/04. Hgb 8.4  7. Thrombocytopenia - Resolved.  8. Atrial fib/flutter, post-op - Continue  amio. - Eliquis 5 mg bid  - Plan TEE/DC-CV today  9. Urinary retention - Placed foley - continue Flomax - UA okay - Foley out. No issues voiding.   10. Post-op ileus - severe - 10/9 KUB c/w ileus   - continue Reglan.   11. Hypokalemia - K 3.4. Supp K   12. Hyponatremia - Na 128 - limit free water\  13. Constipation - 10/9 KUB with ileus.   - advance bowel regimen, d/w pharmacy  -Ambulate - Consider enema   Consult PT. Ambulate. Encouraged to use IS every hour while awake.    Length of  Stay: 15   Glori Bickers, MD  7:34 AM

## 2022-07-24 ENCOUNTER — Other Ambulatory Visit: Payer: Self-pay | Admitting: Thoracic Surgery (Cardiothoracic Vascular Surgery)

## 2022-07-24 DIAGNOSIS — Z951 Presence of aortocoronary bypass graft: Secondary | ICD-10-CM

## 2022-07-24 DIAGNOSIS — I714 Abdominal aortic aneurysm, without rupture, unspecified: Secondary | ICD-10-CM

## 2022-07-24 DIAGNOSIS — Z95828 Presence of other vascular implants and grafts: Secondary | ICD-10-CM | POA: Diagnosis not present

## 2022-07-24 DIAGNOSIS — I350 Nonrheumatic aortic (valve) stenosis: Secondary | ICD-10-CM

## 2022-07-24 DIAGNOSIS — I5022 Chronic systolic (congestive) heart failure: Secondary | ICD-10-CM | POA: Diagnosis not present

## 2022-07-24 DIAGNOSIS — L8990 Pressure ulcer of unspecified site, unspecified stage: Secondary | ICD-10-CM | POA: Insufficient documentation

## 2022-07-24 LAB — BASIC METABOLIC PANEL
Anion gap: 8 (ref 5–15)
BUN: 14 mg/dL (ref 8–23)
CO2: 32 mmol/L (ref 22–32)
Calcium: 8.4 mg/dL — ABNORMAL LOW (ref 8.9–10.3)
Chloride: 91 mmol/L — ABNORMAL LOW (ref 98–111)
Creatinine, Ser: 0.88 mg/dL (ref 0.61–1.24)
GFR, Estimated: >60 mL/min (ref >60)
Glucose, Bld: 106 mg/dL — ABNORMAL HIGH (ref 70–99)
Potassium: 3.8 mmol/L (ref 3.5–5.1)
Sodium: 131 mmol/L — ABNORMAL LOW (ref 135–145)

## 2022-07-24 LAB — COOXEMETRY PANEL
Carboxyhemoglobin: 1.7 % — ABNORMAL HIGH (ref 0.5–1.5)
Methemoglobin: 1.7 % — ABNORMAL HIGH (ref 0.0–1.5)
O2 Saturation: 70 %
Total hemoglobin: 9.3 g/dL — ABNORMAL LOW (ref 12.0–16.0)

## 2022-07-24 LAB — MAGNESIUM: Magnesium: 2.2 mg/dL (ref 1.7–2.4)

## 2022-07-24 MED ORDER — POTASSIUM CHLORIDE CRYS ER 20 MEQ PO TBCR
40.0000 meq | EXTENDED_RELEASE_TABLET | Freq: Two times a day (BID) | ORAL | Status: DC
  Administered 2022-07-24: 40 meq via ORAL
  Filled 2022-07-24: qty 2

## 2022-07-24 MED ORDER — TORSEMIDE 20 MG PO TABS: 40.0000 mg | ORAL_TABLET | Freq: Every day | ORAL | Status: DC

## 2022-07-24 MED ORDER — TORSEMIDE 20 MG PO TABS
40.0000 mg | ORAL_TABLET | Freq: Two times a day (BID) | ORAL | Status: DC
  Administered 2022-07-24 – 2022-07-26 (×5): 40 mg via ORAL
  Filled 2022-07-24 (×5): qty 2

## 2022-07-24 MED ORDER — POTASSIUM CHLORIDE CRYS ER 20 MEQ PO TBCR
40.0000 meq | EXTENDED_RELEASE_TABLET | Freq: Two times a day (BID) | ORAL | Status: DC
  Administered 2022-07-24 – 2022-08-02 (×18): 40 meq via ORAL
  Filled 2022-07-24 (×18): qty 2

## 2022-07-24 MED ORDER — FUROSEMIDE 40 MG PO TABS
40.0000 mg | ORAL_TABLET | Freq: Two times a day (BID) | ORAL | Status: DC
  Administered 2022-07-24: 40 mg via ORAL
  Filled 2022-07-24: qty 1

## 2022-07-24 MED ORDER — SENNOSIDES-DOCUSATE SODIUM 8.6-50 MG PO TABS
1.0000 | ORAL_TABLET | Freq: Every day | ORAL | Status: DC
  Administered 2022-07-25 – 2022-07-27 (×3): 1 via ORAL
  Filled 2022-07-24 (×3): qty 1

## 2022-07-24 MED ORDER — POLYETHYLENE GLYCOL 3350 17 G PO PACK
17.0000 g | PACK | Freq: Every day | ORAL | Status: DC | PRN
  Administered 2022-07-26 – 2022-07-28 (×2): 17 g via ORAL
  Filled 2022-07-24 (×2): qty 1

## 2022-07-24 NOTE — Care Management Important Message (Signed)
Important Message  Patient Details  Name: MIHAIL PRETTYMAN MRN: [id number] Date of Birth: 02-17-1956   Medicare Important Message Given:  Yes     [name] 07/24/2022, 10:27 AM

## 2022-07-24 NOTE — Progress Notes (Signed)
Patient received from Morgan via bed. Ao x4. Denies any pain or discomfort. CHG bath completed, connected to tele and CCMd notified. Oriented patient to room and call bell system. Call bell within reach. Plan of care continues.

## 2022-07-24 NOTE — Plan of Care (Signed)
  Problem: Education: Goal: Understanding of CV disease, CV risk reduction, and recovery process will improve Outcome: Progressing Goal: Individualized Educational Video(s) Outcome: Progressing   Problem: Activity: Goal: Ability to return to baseline activity level will improve Outcome: Progressing   Problem: Cardiovascular: Goal: Ability to achieve and maintain adequate cardiovascular perfusion will improve Outcome: Progressing Goal: Vascular access site(s) Level 0-1 will be maintained Outcome: Progressing   Problem: Health Behavior/Discharge Planning: Goal: Ability to safely manage health-related needs after discharge will improve Outcome: Progressing   Problem: Education: Goal: Knowledge of General Education information will improve Description: Including pain rating scale, medication(s)/side effects and non-pharmacologic comfort measures Outcome: Progressing   Problem: Health Behavior/Discharge Planning: Goal: Ability to manage health-related needs will improve Outcome: Progressing   Problem: Clinical Measurements: Goal: Ability to maintain clinical measurements within normal limits will improve Outcome: Progressing Goal: Will remain free from infection Outcome: Progressing Goal: Diagnostic test results will improve Outcome: Progressing Goal: Respiratory complications will improve Outcome: Progressing Goal: Cardiovascular complication will be avoided Outcome: Progressing   Problem: Activity: Goal: Risk for activity intolerance will decrease Outcome: Progressing   Problem: Nutrition: Goal: Adequate nutrition will be maintained Outcome: Progressing   Problem: Coping: Goal: Level of anxiety will decrease Outcome: Progressing   Problem: Elimination: Goal: Will not experience complications related to bowel motility Outcome: Progressing Goal: Will not experience complications related to urinary retention Outcome: Progressing   Problem: Pain Managment: Goal:  General experience of comfort will improve Outcome: Progressing   Problem: Safety: Goal: Ability to remain free from injury will improve Outcome: Progressing   Problem: Skin Integrity: Goal: Risk for impaired skin integrity will decrease Outcome: Progressing   Problem: Education: Goal: Will demonstrate proper wound care and an understanding of methods to prevent future damage Outcome: Progressing Goal: Knowledge of disease or condition will improve Outcome: Progressing Goal: Knowledge of the prescribed therapeutic regimen will improve Outcome: Progressing Goal: Individualized Educational Video(s) Outcome: Progressing   Problem: Activity: Goal: Risk for activity intolerance will decrease Outcome: Progressing   Problem: Cardiac: Goal: Will achieve and/or maintain hemodynamic stability Outcome: Progressing   Problem: Clinical Measurements: Goal: Postoperative complications will be avoided or minimized Outcome: Progressing   Problem: Respiratory: Goal: Respiratory status will improve Outcome: Progressing   Problem: Skin Integrity: Goal: Wound healing without signs and symptoms of infection Outcome: Progressing Goal: Risk for impaired skin integrity will decrease Outcome: Progressing   Problem: Urinary Elimination: Goal: Ability to achieve and maintain adequate renal perfusion and functioning will improve Outcome: Progressing   Problem: Education: Goal: Ability to describe self-care measures that may prevent or decrease complications (Diabetes Survival Skills Education) will improve Outcome: Progressing Goal: Individualized Educational Video(s) Outcome: Progressing   Problem: Coping: Goal: Ability to adjust to condition or change in health will improve Outcome: Progressing   Problem: Fluid Volume: Goal: Ability to maintain a balanced intake and output will improve Outcome: Progressing   Problem: Health Behavior/Discharge Planning: Goal: Ability to identify and  utilize available resources and services will improve Outcome: Progressing Goal: Ability to manage health-related needs will improve Outcome: Progressing   Problem: Metabolic: Goal: Ability to maintain appropriate glucose levels will improve Outcome: Progressing   Problem: Nutritional: Goal: Maintenance of adequate nutrition will improve Outcome: Progressing Goal: Progress toward achieving an optimal weight will improve Outcome: Progressing   Problem: Skin Integrity: Goal: Risk for impaired skin integrity will decrease Outcome: Progressing   Problem: Tissue Perfusion: Goal: Adequacy of tissue perfusion will improve Outcome: Progressing   

## 2022-07-24 NOTE — Progress Notes (Addendum)
      [address]       [place] [address]             [phone]       1 Day Post-Op Procedure(s) (LRB): TRANSESOPHAGEAL ECHOCARDIOGRAM (TEE) (N/A) CARDIOVERSION (N/A) Subjective: Patient states his bottom hurts this morning  Objective: Vital signs in last 24 hours: Temp:  [97.8 F (36.6 C)-98.6 F (37 C)] 97.8 F (36.6 C) (10/13 0611) Pulse Rate:  [55-65] 58 (10/13 0611) Cardiac Rhythm: Sinus bradycardia;Bundle branch block;Heart block (10/13 0024) Resp:  [13-26] 20 (10/13 0611) BP: (90-114)/(55-77) 113/64 (10/13 0611) SpO2:  [89 %-97 %] 93 % (10/13 0611) Weight:  [77.6 kg] 77.6 kg (10/13 0500)  Hemodynamic parameters for last 24 hours: CVP:  [5 mmHg-11 mmHg] 10 mmHg  Intake/Output from previous day: 10/12 0701 - 10/13 0700 In: 200 [I.V.:200] Out: 1500 [Urine:1500] Intake/Output this shift: No intake/output data recorded.  General appearance: alert, cooperative, and no distress Neurologic: intact Heart: Sinus rhythm-sinus bradycardia, no murmur, rub or gallop Lungs: slightly diminshed left basilar otherwise clear to auscultation Abdomen: Normal bowel sounds, nondistended, minimally tender Extremities: edematous Wound: Sternal wound clean, dry, intact. Small sacral sore with clean, dry foam dressing   Lab Results: Recent Labs    07/22/22 0437  WBC 12.6*  HGB 8.4*  HCT 24.3*  PLT 231   BMET:  Recent Labs    07/23/22 0532 07/24/22 0409  NA 128* 131*  K 3.4* 3.8  CL 90* 91*  CO2 28 32  GLUCOSE 118* 106*  BUN 13 14  CREATININE 0.84 0.88  CALCIUM 8.0* 8.4*    PT/INR:  Recent Labs    07/22/22 1838  LABPROT 18.3*  INR 1.5*   ABG    Component Value Date/Time   PHART 7.342 (L) 07/10/2022 0546   HCO3 23.4 07/10/2022 0546   TCO2 30 07/16/2022 0711   ACIDBASEDEF 2.0 07/10/2022 0546   O2SAT 70 07/24/2022 0409   CBG (last 3)  No results for input(s): "GLUCAP" in the last 72 hours.  Assessment/Plan: S/P Procedure(s)  (LRB): TRANSESOPHAGEAL ECHOCARDIOGRAM (TEE) (N/A) CARDIOVERSION (N/A)  CV: Hx postop afib/flutter, DC-CV yesterday. In sinus rhythm/sinus bradycardia this morning, rate 58-60. On PO amiodarone '200mg'$  BID and Eliquis '5mg'$  BID.  1st degree heart block. SBP 113. On midodrine '5mg'$  TID. Pulm: Saturating 90% on RA this AM. Continue IS and ambulation. GI: Postop ileus, loose stool in bed this AM.  Renal: Volume overload, +6kg. Cr 0.88. Diuresis held due to hyponatremia. Hyponatremia: 131 today, improving. Will continue to hold diuretics. Wounds/ID: Sternal incision healing well. Small sacral sore with clean dry foam dressing. No sign of infection. Continue repositioning and dressing changes as needed. Has no pain medication ordered due to postop ileus. Dispo: PT consulted, recommended home health PT. Will order. Discharge planning.   LOS: 16 days    Magdalene River, PA-C 07/24/2022 Agree with above Will continue to wean midodrine  Changed to bid oral lasix Needs PT and home discharge planning Advance to regular diet

## 2022-07-24 NOTE — Progress Notes (Addendum)
Patient ID: ELLERY MERONEY, male   DOB: 1956/03/29, 66 y.o.   MRN: [id number]     Advanced Heart Failure Rounding Note  PCP-Cardiologist: Skeet Latch, MD   Subjective:    9/27 s/p Bentall with bioprosthetic aortic valve   Post-op course c/b ileus. Had BM this morning. Loose stools. Still feels uncomfortable. Abdomen remains distended.   S/p DCCV 10/12. NSR/SB on tele this morning.   Co-ox 70%. CVP 7-8   Remains in AF rates 80-90s. On Eliquis   Objective:   Weight Range: 79.1 kg Body mass index is 27.31 kg/m.   Vital Signs:   Temp:  [96.1 F (35.6 C)-98.4 F (36.9 C)] 96.1 F (35.6 C) (10/12 0709) Pulse Rate:  [87-198] 97 (10/12 0709) Resp:  [9-30] 22 (10/12 0709) BP: (85-115)/(62-95) 113/69 (10/12 0709) SpO2:  [87 %-99 %] 91 % (10/12 0709) Weight:  [79.1 kg] 79.1 kg (10/12 0709) Last BM Date : 07/14/22  Weight change: Filed Weights   07/21/22 0500 07/22/22 0500 07/23/22 0709  Weight: 79.4 kg 79.1 kg 79.1 kg    Intake/Output:   Intake/Output Summary (Last 24 hours) at 07/23/2022 0734 Last data filed at 07/23/2022 0700 Gross per 24 hour  Intake 733.17 ml  Output 3075 ml  Net -2341.83 ml       Physical Exam   CVP 7-8  General:  fatigued appearing. No respiratory difficulty HEENT: normal Neck: supple. JVD 8 cm. Carotids 2+ bilat; no bruits. No lymphadenopathy or thyromegaly appreciated. Cor: PMI nondisplaced. RRR. No rubs, gallops or murmurs. Lungs: clear Abdomen: soft, nontender, +distended. No hepatosplenomegaly. No bruits or masses. Good bowel sounds. Extremities: no cyanosis, clubbing, rash, trace b/l LE edema Neuro: alert & oriented x 3, cranial nerves grossly intact. moves all 4 extremities w/o difficulty. Affect pleasant.   Telemetry    SB upper 50s   Labs    CBC Recent Labs    07/21/22 0425 07/22/22 0437  WBC 13.9* 12.6*  HGB 8.6* 8.4*  HCT 25.2* 24.3*  MCV 97.7 95.3  PLT 193 703    Basic Metabolic Panel Recent Labs     07/22/22 0437 07/23/22 0532  NA 127* 128*  K 3.7 3.4*  CL 89* 90*  CO2 28 28  GLUCOSE 122* 118*  BUN 15 13  CREATININE 0.89 0.84  CALCIUM 8.3* 8.0*  MG 2.0 1.9    Liver Function Tests No results for input(s): "AST", "ALT", "ALKPHOS", "BILITOT", "PROT", "ALBUMIN" in the last 72 hours.  BNP: BNP (last 3 results) Recent Labs    06/25/22 1829  BNP 436.2*      Imaging  reviewed  Medications:     Scheduled Medications:  [MAR Hold] apixaban  5 mg Oral BID   [MAR Hold] aspirin EC  81 mg Oral Daily   [MAR Hold] atorvastatin  80 mg Oral Daily   [MAR Hold] Chlorhexidine Gluconate Cloth  6 each Topical Daily   [MAR Hold] furosemide  80 mg Intravenous BID   [MAR Hold] metoCLOPramide (REGLAN) injection  10 mg Intravenous Q6H   [MAR Hold] midodrine  10 mg Oral TID WC   [MAR Hold] pantoprazole  40 mg Oral Daily   [MAR Hold] polyethylene glycol  17 g Oral BID   [MAR Hold] senna-docusate  1 tablet Oral BID   [MAR Hold] sodium chloride flush  10-40 mL Intracatheter Q12H   [MAR Hold] sodium chloride flush  10-40 mL Intracatheter Q12H   [MAR Hold] sodium chloride flush  3 mL Intravenous Q12H   [  MAR Hold] tamsulosin  0.4 mg Oral BID    Infusions:  [MAR Hold] sodium chloride Stopped (07/22/22 1203)   sodium chloride 20 mL/hr at 07/23/22 0700   amiodarone 30 mg/hr (07/23/22 0700)   [MAR Hold] norepinephrine (LEVOPHED) Adult infusion Stopped (07/17/22 1016)   [MAR Hold] promethazine (PHENERGAN) injection (IM or IVPB)      PRN Medications: [MAR Hold] sodium chloride, [MAR Hold] bisacodyl, [MAR Hold] hydrocortisone, [MAR Hold] hydrocortisone, [MAR Hold] melatonin, [MAR Hold]  morphine injection, [MAR Hold] ondansetron (ZOFRAN) IV, [MAR Hold] mouth rinse, [MAR Hold] promethazine (PHENERGAN) injection (IM or IVPB), [MAR Hold] sodium chloride flush, [MAR Hold] sodium chloride flush, [MAR Hold] sodium chloride flush    Patient Profile  66 y/o male recently found to have severe LV  dysfunction in setting of markedly dilated aortic root (67 mm) and severe AI (tricuspid AoV) and single vessel CAD, now s/p Bentall and CABG x 1 w/ SVG-RCA. AHF team consulted to assist w/ post-operative management of systolic heart failure.     Assessment/Plan   Ascending Aortic Aneurysm/ Severe AI  - CTA  w/ severe dilation at the sinus of Valsalva, 67 mm. Moderate dilation of mid ascending aorta, 44 mm. Tricuspid AoV - s/p Bentall with bioprosthetic aortic valve 9/27 - intraoperative TEE, no perivalvular leak post repair  - Echo 10/10 EF 25% RV function improving. Moderate to severe MR (in setting of LBBB) AVR ok     2. CAD - single vessel, 60-70% pRCA  - s/p CABG x 1, SVG- RCA  - ASA + statin  - hold initiation of ? blocker for now.  - No chest pain   3. Chronic Systolic Heart Failure  - newly diagnosed; Echo 9/23 EF 25-30%, LV severely dilated, severe AI, Global HK, RV ok  - NICM, severity out of proportion to degree of CAD  - likely valvular CM in setting of severe AI +/- LBBB CM  - now post op bioprosthetic AVR + CABG. Intraoperative TEE LVEF 20-25%, RV ok - With junctional rhythm, will hold off on beta blocker and digoxin for now to avoid worsening A-V conduction. Eventual CRT-D (see below).  - Hypotensive 10/04 in setting of urinary retention.  - Echo 10/9- EF 25% RV function improving. Moderate to severe MR (in setting of LBBB) AVR ok   - Co-ox 70%, stable.    - GDMT limited. On midodrine 5 mg three times a day for BP support. Can wean slowly as tolerated - CVP 8. PO Lasix 40 mg bid   4. Junctional rhythm - Resolved  - Has epicardial LV lead. EP following. Had been considering CRT-D prior to discharge but given need for DC-CV and prolonged hospitalization likely better wait at least several weeks until he recovers   5. LBBB -Will eventually need epicardial lead hooked up for CRT see discussion above   6. Anemia - Post-op, 1 u RBCs 10/04. Hgb 8.4 on 10/11  7.  Thrombocytopenia - Resolved.  8. Atrial fib/flutter, post-op - s/p DCCV 10/12>>NSR  - Continue amio, 200 mg bid. - Eliquis 5 mg bid    9. Urinary retention - Placed foley - continue Flomax - UA okay - Foley out. No issues voiding.   10. Post-op ileus/ Constipation  - 10/9 KUB c/w ileus   - + BM today   11. Hypokalemia - K 3.8. Supp PRN   12. Hyponatremia - Na 131 - limit free water    Length of Stay: [address], PA-C  7:34 AM  Patient seen and examined with the above-signed Advanced Practice Provider and/or Housestaff. I personally reviewed laboratory data, imaging studies and relevant notes. I independently examined the patient and formulated the important aspects of the plan. I have edited the note to reflect any of my changes or salient points. I have personally discussed the plan with the patient and/or family.  Remains in NSR after DC-CV. On Eliquis. Multiple large BMs overnight but still feels distended.   Denies CP or SOB. Weight still up. Co-ox 70%  General:  Weak appearing. No resp difficulty HEENT: normal Neck: supple. JVP 8 Carotids 2+ bilat; no bruits. No lymphadenopathy or thryomegaly appreciated. Cor: PMI nondisplaced. Regular rate & rhythm. No rubs, gallops or murmurs. Lungs: decreased at bases Abdomen: soft, nontender, nondistended. No hepatosplenomegaly. No bruits or masses. Hyperactive bowel sounds. Extremities: no cyanosis, clubbing, rash, edema Neuro: alert & orientedx3, cranial nerves grossly intact. moves all 4 extremities w/o difficulty. Affect pleasant  Continue to make slow progress. Cardiac output now ok. Switch po lasix to po torsemide. Has had large BM. Hold bowel regimen today. Restart gentle regimen tomorrow. Continue po amio. Ambulate.   Hopefully home early next week.   Glori Bickers, MD  4:05 PM

## 2022-07-24 NOTE — Progress Notes (Addendum)
Pt ambulated with Nurse Tech prior to arrival. Pt was educated with mom present on sternal precautions, restrictions, continued IS use, ex guidelines, heart healthy diet and CRPII. Pt will be referred to [facility].  Christen Bame 1:12 PM 07/24/2022

## 2022-07-24 NOTE — TOC CM/SW Note (Signed)
HF TOC CM contacted [facility] with new referral for HHPT. Accepted referral. Jonnie Finner RN3 CCM, Heart Failure TOC CM [phone]

## 2022-07-25 ENCOUNTER — Inpatient Hospital Stay (HOSPITAL_COMMUNITY): Payer: Medicare Other

## 2022-07-25 LAB — CBC
HCT: 25.5 % — ABNORMAL LOW (ref 39.0–52.0)
Hemoglobin: 8.8 g/dL — ABNORMAL LOW (ref 13.0–17.0)
MCH: 33.2 pg (ref 26.0–34.0)
MCHC: 34.5 g/dL (ref 30.0–36.0)
MCV: 96.2 fL (ref 80.0–100.0)
Platelets: 362 10*3/uL (ref 150–400)
RBC: 2.65 MIL/uL — ABNORMAL LOW (ref 4.22–5.81)
RDW: 14.7 % (ref 11.5–15.5)
WBC: 8.8 10*3/uL (ref 4.0–10.5)
nRBC: 0 % (ref 0.0–0.2)

## 2022-07-25 LAB — BASIC METABOLIC PANEL
Anion gap: 12 (ref 5–15)
BUN: 14 mg/dL (ref 8–23)
CO2: 31 mmol/L (ref 22–32)
Calcium: 8.5 mg/dL — ABNORMAL LOW (ref 8.9–10.3)
Chloride: 91 mmol/L — ABNORMAL LOW (ref 98–111)
Creatinine, Ser: 1 mg/dL (ref 0.61–1.24)
GFR, Estimated: >60 mL/min (ref >60)
Glucose, Bld: 103 mg/dL — ABNORMAL HIGH (ref 70–99)
Potassium: 3.5 mmol/L (ref 3.5–5.1)
Sodium: 134 mmol/L — ABNORMAL LOW (ref 135–145)

## 2022-07-25 LAB — COOXEMETRY PANEL
Carboxyhemoglobin: 2.1 % — ABNORMAL HIGH (ref 0.5–1.5)
Methemoglobin: 1.8 % — ABNORMAL HIGH (ref 0.0–1.5)
O2 Saturation: 68.8 %
Total hemoglobin: 9.2 g/dL — ABNORMAL LOW (ref 12.0–16.0)

## 2022-07-25 MED ORDER — MIDODRINE HCL 5 MG PO TABS
5.0000 mg | ORAL_TABLET | Freq: Two times a day (BID) | ORAL | Status: DC
  Administered 2022-07-25 – 2022-07-26 (×3): 5 mg via ORAL
  Filled 2022-07-25 (×4): qty 1

## 2022-07-25 NOTE — Progress Notes (Signed)
CARDIAC REHAB PHASE I   PRE:  Rate/Rhythm: Sinus brady 59  BP:    Sitting: 101/68     SaO2: 96% RA  MODE:  Ambulation: 300 ft   POST:  Rate/Rhythem: 72  BP:    Sitting: 112/64     SaO2: 94% RA [id number] patient ambulated in hallway using rolling walker. Stopped times one to rest. Tolerated well. Patient assisted back to bed with call bell within reach. Patient used IS to 900.  Harrell Gave RN

## 2022-07-25 NOTE — Progress Notes (Addendum)
      [address]       [place] [address]             [phone]       2 Days Post-Op Procedure(s) (LRB): TRANSESOPHAGEAL ECHOCARDIOGRAM (TEE) (N/A) CARDIOVERSION (N/A)  Subjective:  Patient sitting up in chair.  He continues to have pain on his bottom.  + ambulation  + BM  Objective: Vital signs in last 24 hours: Temp:  [97.6 F (36.4 C)-98.5 F (36.9 C)] 98 F (36.7 C) (10/14 0830) Pulse Rate:  [59-63] 63 (10/14 0830) Cardiac Rhythm: Bundle branch block;Heart block (10/13 1900) Resp:  [18-26] 26 (10/14 0830) BP: (95-126)/(58-71) 96/71 (10/14 0830) SpO2:  [92 %-96 %] 94 % (10/14 0830) Weight:  [75.5 kg] 75.5 kg (10/14 0500)  Hemodynamic parameters for last 24 hours: CVP:  [2 mmHg-7 mmHg] 6 mmHg  Intake/Output from previous day: 10/13 0701 - 10/14 0700 In: 240 [P.O.:240] Out: 3300 [Urine:3300]  General appearance: alert, cooperative, and no distress Heart: regular rate and rhythm Lungs: clear to auscultation bilaterally Abdomen: soft, non-tender; bowel sounds normal; no masses,  no organomegaly Extremities: edema trace Wound: clean and dry  Lab Results: Recent Labs    07/25/22 0500  WBC 8.8  HGB 8.8*  HCT 25.5*  PLT 362   BMET:  Recent Labs    07/24/22 0409 07/25/22 0500  NA 131* 134*  K 3.8 3.5  CL 91* 91*  CO2 32 31  GLUCOSE 106* 103*  BUN 14 14  CREATININE 0.88 1.00  CALCIUM 8.4* 8.5*    PT/INR:  Recent Labs    07/22/22 1838  LABPROT 18.3*  INR 1.5*   ABG    Component Value Date/Time   PHART 7.342 (L) 07/10/2022 0546   HCO3 23.4 07/10/2022 0546   TCO2 30 07/16/2022 0711   ACIDBASEDEF 2.0 07/10/2022 0546   O2SAT 68.8 07/25/2022 0500   CBG (last 3)  No results for input(s): "GLUCAP" in the last 72 hours.  Assessment/Plan: S/P Procedure(s) (LRB): TRANSESOPHAGEAL ECHOCARDIOGRAM (TEE) (N/A) CARDIOVERSION (N/A)  CV- Sinus Bradycardia with PVCs, S/P Cardioversion, hypotension- continue Midodrine, continue  Amiodarone 200 mg BID... LV lead in place, for CRT-D placement as outpatient once more recovered Pulm- off oxygen, continue IS Renal- creatinine at 1.00, on Demadex per AHF GI- post operative Ileus- resolved, several BMs yesterday, diarrhea has resolved Hyponatremia-improved Deconditioning- continue PT/OT   LOS: 17 days    Ellwood Handler, PA-C 07/25/2022 Feels much better and doing well in sinus rhythm Now on p.o. amiodarone Today's chest x-ray personally reviewed showing clear lung fields, no pleural effusion Co. ox this a.m. excellent-68% Continue p.o. Demadex for weight 9 pounds above preop  patient examined and medical record reviewed,agree with above note. Joe [name] 07/25/2022

## 2022-07-26 LAB — COOXEMETRY PANEL
Carboxyhemoglobin: 1.9 % — ABNORMAL HIGH (ref 0.5–1.5)
Methemoglobin: <0.7 % (ref 0.0–1.5)
O2 Saturation: 65.5 %
Total hemoglobin: 9.5 g/dL — ABNORMAL LOW (ref 12.0–16.0)

## 2022-07-26 MED ORDER — AMIODARONE HCL 200 MG PO TABS: 200.0000 mg | ORAL_TABLET | Freq: Every day | ORAL | Status: DC

## 2022-07-26 MED ORDER — POTASSIUM CHLORIDE CRYS ER 20 MEQ PO TBCR
40.0000 meq | EXTENDED_RELEASE_TABLET | Freq: Once | ORAL | Status: AC
  Administered 2022-07-26: 40 meq via ORAL
  Filled 2022-07-26: qty 2

## 2022-07-26 NOTE — Progress Notes (Addendum)
      [address]       [place] [address]             [phone]      3 Days Post-Op Procedure(s) (LRB): TRANSESOPHAGEAL ECHOCARDIOGRAM (TEE) (N/A) CARDIOVERSION (N/A)  Subjective:  No new complaints.  Diarrhea resolved.  Objective: Vital signs in last 24 hours: Temp:  [97.8 F (36.6 C)-98.4 F (36.9 C)] 98 F (36.7 C) (10/15 0838) Pulse Rate:  [60-68] 60 (10/15 0838) Cardiac Rhythm: Sinus bradycardia;Bundle branch block (10/15 0940) Resp:  [18-24] 20 (10/15 0838) BP: (95-110)/(61-75) 97/61 (10/15 0838) SpO2:  [91 %-100 %] 93 % (10/15 0838) Weight:  [75 kg] 75 kg (10/15 0441)  Hemodynamic parameters for last 24 hours: CVP:  [6 mmHg-10 mmHg] 10 mmHg  Intake/Output from previous day: 10/14 0701 - 10/15 0700 In: 720 [P.O.:720] Out: 2650 [Urine:2650]  General appearance: alert, cooperative, and no distress Heart: regular rate and rhythm Lungs: clear to auscultation bilaterally Abdomen: soft, non-tender; bowel sounds normal; no masses,  no organomegaly Extremities: edema trace Wound: clean and dry  Lab Results: Recent Labs    07/25/22 0500  WBC 8.8  HGB 8.8*  HCT 25.5*  PLT 362   BMET:  Recent Labs    07/24/22 0409 07/25/22 0500  NA 131* 134*  K 3.8 3.5  CL 91* 91*  CO2 32 31  GLUCOSE 106* 103*  BUN 14 14  CREATININE 0.88 1.00  CALCIUM 8.4* 8.5*    PT/INR: No results for input(s): "LABPROT", "INR" in the last 72 hours. ABG    Component Value Date/Time   PHART 7.342 (L) 07/10/2022 0546   HCO3 23.4 07/10/2022 0546   TCO2 30 07/16/2022 0711   ACIDBASEDEF 2.0 07/10/2022 0546   O2SAT 65.5 07/26/2022 0620   CBG (last 3)  No results for input(s): "GLUCAP" in the last 72 hours.  Assessment/Plan: S/P Procedure(s) (LRB): TRANSESOPHAGEAL ECHOCARDIOGRAM (TEE) (N/A) CARDIOVERSION (N/A)  CV- maintaining Sinus Loletha Grayer S/P Cardioversion- continue Amiodarone, Midodrine... for CRT-D placement in a few weeks once his has recovered from  surgery Pulm- off oxygen, continue IS REnal- creatinine has been stable, weight continues to trend down, on Demadex... repeat BMET in AM Deconditioning- continue PT/OT Dispo- patient stable, continue diuresis per AHF, remains in Sinus Brady on Midodrine, continue PT/OT.[name] Kitchen likely ready for d/c soon   LOS: 18 days    Ellwood Handler, PA-C 07/26/2022  Patient remains in sinus rhythm without complaint Has not been ambulated yet due to staffing on Sunday Heart rate remains slow 55-60.  Off beta-blocker and taking midodrine for blood pressure.  We will reduce amiodarone to one dose daily  200 mg  Chest x-ray satisfactory, sternal incision clean and dry, no cardiac murmur.

## 2022-07-27 ENCOUNTER — Inpatient Hospital Stay (HOSPITAL_COMMUNITY): Payer: Medicare Other

## 2022-07-27 ENCOUNTER — Ambulatory Visit: Payer: Self-pay

## 2022-07-27 DIAGNOSIS — R609 Edema, unspecified: Secondary | ICD-10-CM

## 2022-07-27 DIAGNOSIS — I5022 Chronic systolic (congestive) heart failure: Secondary | ICD-10-CM | POA: Diagnosis not present

## 2022-07-27 DIAGNOSIS — I714 Abdominal aortic aneurysm, without rupture, unspecified: Secondary | ICD-10-CM | POA: Diagnosis not present

## 2022-07-27 DIAGNOSIS — R52 Pain, unspecified: Secondary | ICD-10-CM

## 2022-07-27 DIAGNOSIS — Z95828 Presence of other vascular implants and grafts: Secondary | ICD-10-CM | POA: Diagnosis not present

## 2022-07-27 DIAGNOSIS — I350 Nonrheumatic aortic (valve) stenosis: Secondary | ICD-10-CM | POA: Diagnosis not present

## 2022-07-27 LAB — CBC WITH DIFFERENTIAL/PLATELET
Abs Immature Granulocytes: 0.04 10*3/uL (ref 0.00–0.07)
Basophils Absolute: 0 10*3/uL (ref 0.0–0.1)
Basophils Relative: 0 %
Eosinophils Absolute: 0 10*3/uL (ref 0.0–0.5)
Eosinophils Relative: 0 %
HCT: 29.2 % — ABNORMAL LOW (ref 39.0–52.0)
Hemoglobin: 9.8 g/dL — ABNORMAL LOW (ref 13.0–17.0)
Immature Granulocytes: 1 %
Lymphocytes Relative: 9 %
Lymphs Abs: 0.8 10*3/uL (ref 0.7–4.0)
MCH: 32.5 pg (ref 26.0–34.0)
MCHC: 33.6 g/dL (ref 30.0–36.0)
MCV: 96.7 fL (ref 80.0–100.0)
Monocytes Absolute: 0.8 10*3/uL (ref 0.1–1.0)
Monocytes Relative: 10 %
Neutro Abs: 7 10*3/uL (ref 1.7–7.7)
Neutrophils Relative %: 80 %
Platelets: 431 10*3/uL — ABNORMAL HIGH (ref 150–400)
RBC: 3.02 MIL/uL — ABNORMAL LOW (ref 4.22–5.81)
RDW: 15 % (ref 11.5–15.5)
WBC: 8.7 10*3/uL (ref 4.0–10.5)
nRBC: 0 % (ref 0.0–0.2)

## 2022-07-27 LAB — BASIC METABOLIC PANEL
Anion gap: 15 (ref 5–15)
BUN: 17 mg/dL (ref 8–23)
CO2: 30 mmol/L (ref 22–32)
Calcium: 9.1 mg/dL (ref 8.9–10.3)
Chloride: 88 mmol/L — ABNORMAL LOW (ref 98–111)
Creatinine, Ser: 1.18 mg/dL (ref 0.61–1.24)
GFR, Estimated: >60 mL/min (ref >60)
Glucose, Bld: 100 mg/dL — ABNORMAL HIGH (ref 70–99)
Potassium: 3.6 mmol/L (ref 3.5–5.1)
Sodium: 133 mmol/L — ABNORMAL LOW (ref 135–145)

## 2022-07-27 LAB — URINALYSIS, ROUTINE W REFLEX MICROSCOPIC
Bilirubin Urine: NEGATIVE
Glucose, UA: NEGATIVE mg/dL
Ketones, ur: NEGATIVE mg/dL
Nitrite: NEGATIVE
Protein, ur: NEGATIVE mg/dL
Specific Gravity, Urine: 1.035 — ABNORMAL HIGH (ref 1.005–1.030)
WBC, UA: >50 WBC/hpf — ABNORMAL HIGH (ref 0–5)
pH: 5 (ref 5.0–8.0)

## 2022-07-27 LAB — COOXEMETRY PANEL
Carboxyhemoglobin: 2.1 % — ABNORMAL HIGH (ref 0.5–1.5)
Methemoglobin: <0.7 % (ref 0.0–1.5)
O2 Saturation: 64.9 %
Total hemoglobin: 9.4 g/dL — ABNORMAL LOW (ref 12.0–16.0)

## 2022-07-27 LAB — LACTIC ACID, PLASMA: Lactic Acid, Venous: 0.6 mmol/L (ref 0.5–1.9)

## 2022-07-27 MED ORDER — MIDODRINE HCL 5 MG PO TABS
5.0000 mg | ORAL_TABLET | Freq: Three times a day (TID) | ORAL | Status: DC
  Administered 2022-07-27 (×2): 5 mg via ORAL
  Filled 2022-07-27: qty 1

## 2022-07-27 MED ORDER — SODIUM CHLORIDE 0.9 % IV SOLN
2.0000 g | INTRAVENOUS | Status: DC
  Administered 2022-07-27 – 2022-07-31 (×5): 2 g via INTRAVENOUS
  Filled 2022-07-27 (×5): qty 20

## 2022-07-27 MED ORDER — SODIUM CHLORIDE 0.9 % IV BOLUS: 250.0000 mL | Freq: Once | INTRAVENOUS | Status: DC

## 2022-07-27 MED ORDER — MIDODRINE HCL 5 MG PO TABS: 5.0000 mg | ORAL_TABLET | Freq: Three times a day (TID) | ORAL | Status: DC

## 2022-07-27 MED ORDER — AMIODARONE HCL IN DEXTROSE 360-4.14 MG/200ML-% IV SOLN
30.0000 mg/h | INTRAVENOUS | Status: AC
  Administered 2022-07-27 (×2): 30 mg/h via INTRAVENOUS
  Filled 2022-07-27: qty 200
  Filled 2022-07-27: qty 400
  Filled 2022-07-27: qty 200

## 2022-07-27 MED ORDER — ACETAMINOPHEN 325 MG PO TABS
650.0000 mg | ORAL_TABLET | Freq: Four times a day (QID) | ORAL | Status: DC | PRN
  Administered 2022-07-27 – 2022-08-12 (×13): 650 mg via ORAL
  Filled 2022-07-27 (×13): qty 2

## 2022-07-27 MED ORDER — MIDODRINE HCL 5 MG PO TABS
10.0000 mg | ORAL_TABLET | Freq: Three times a day (TID) | ORAL | Status: DC
  Administered 2022-07-27 – 2022-07-29 (×5): 10 mg via ORAL
  Filled 2022-07-27 (×5): qty 2

## 2022-07-27 MED ORDER — MORPHINE SULFATE (PF) 2 MG/ML IV SOLN
INTRAVENOUS | Status: AC
  Administered 2022-07-27: 2 mg via INTRAVENOUS
  Filled 2022-07-27: qty 1

## 2022-07-27 MED ORDER — MORPHINE SULFATE (PF) 2 MG/ML IV SOLN
2.0000 mg | Freq: Once | INTRAVENOUS | Status: AC
  Administered 2022-07-27: 2 mg via INTRAVENOUS

## 2022-07-27 MED ORDER — NOREPINEPHRINE 4 MG/250ML-% IV SOLN
0.0000 ug/min | INTRAVENOUS | Status: DC
  Administered 2022-07-27: 5 ug/min via INTRAVENOUS
  Filled 2022-07-27 (×2): qty 250

## 2022-07-27 MED ORDER — IOHEXOL 350 MG/ML SOLN
100.0000 mL | Freq: Once | INTRAVENOUS | Status: AC | PRN
  Administered 2022-07-27: 100 mL via INTRAVENOUS

## 2022-07-27 MED ORDER — DIAZEPAM 2 MG PO TABS
2.0000 mg | ORAL_TABLET | ORAL | Status: AC
  Administered 2022-07-27: 2 mg via ORAL
  Filled 2022-07-27: qty 1

## 2022-07-27 MED ORDER — TRAMADOL HCL 50 MG PO TABS
50.0000 mg | ORAL_TABLET | Freq: Four times a day (QID) | ORAL | Status: DC | PRN
  Administered 2022-07-27 – 2022-08-13 (×26): 50 mg via ORAL
  Filled 2022-07-27 (×26): qty 1

## 2022-07-27 MED ORDER — ALBUMIN HUMAN 5 % IV SOLN
12.5000 g | Freq: Once | INTRAVENOUS | Status: AC
  Administered 2022-07-27: 12.5 g via INTRAVENOUS
  Filled 2022-07-27 (×2): qty 250

## 2022-07-27 MED ORDER — AMIODARONE HCL IN DEXTROSE 360-4.14 MG/200ML-% IV SOLN
60.0000 mg/h | INTRAVENOUS | Status: AC
  Administered 2022-07-27: 60 mg/h via INTRAVENOUS
  Filled 2022-07-27: qty 200

## 2022-07-27 MED ORDER — MORPHINE SULFATE (PF) 2 MG/ML IV SOLN
INTRAVENOUS | Status: AC
  Filled 2022-07-27: qty 1

## 2022-07-27 MED ORDER — TRAMADOL HCL 50 MG PO TABS: 50.0000 mg | ORAL_TABLET | Freq: Four times a day (QID) | ORAL | Status: DC

## 2022-07-27 NOTE — Progress Notes (Signed)
      [address]       [place] [address]             [phone]       4 Days Post-Op Procedure(s) (LRB): TRANSESOPHAGEAL ECHOCARDIOGRAM (TEE) (N/A) CARDIOVERSION (N/A) Subjective: Patient states his back hurts 10/10 this morning.  Objective: Vital signs in last 24 hours: Temp:  [97.6 F (36.4 C)-98.2 F (36.8 C)] 97.9 F (36.6 C) (10/16 0600) Pulse Rate:  [60-115] 115 (10/16 0600) Cardiac Rhythm: Bundle branch block (10/15 2030) Resp:  [17-26] 24 (10/16 0600) BP: (91-117)/(60-78) 91/78 (10/16 0600) SpO2:  [92 %-100 %] 94 % (10/16 0600)  Hemodynamic parameters for last 24 hours: CVP:  [10 mmHg] 10 mmHg  Intake/Output from previous day: 10/15 0701 - 10/16 0700 In: 560 [P.O.:550; I.V.:10] Out: 3450 [Urine:3450] Intake/Output this shift: Total I/O In: -  Out: 1300 [Urine:1300]  General appearance: alert, cooperative, and mild distress Neurologic: intact Heart: irregularly irregular rhythm Lungs: clear to auscultation bilaterally Abdomen: soft, tender, distended Extremities: trace edema bilateral lower extremities, RUE edema, pulses intact  Wound: clean, dry, intact  Lab Results: Recent Labs    07/25/22 0500 07/27/22 0520  WBC 8.8 8.7  HGB 8.8* 9.8*  HCT 25.5* 29.2*  PLT 362 431*   BMET:  Recent Labs    07/25/22 0500 07/27/22 0440  NA 134* 133*  K 3.5 3.6  CL 91* 88*  CO2 31 30  GLUCOSE 103* 100*  BUN 14 17  CREATININE 1.00 1.18  CALCIUM 8.5* 9.1    PT/INR: No results for input(s): "LABPROT", "INR" in the last 72 hours. ABG    Component Value Date/Time   PHART 7.342 (L) 07/10/2022 0546   HCO3 23.4 07/10/2022 0546   TCO2 30 07/16/2022 0711   ACIDBASEDEF 2.0 07/10/2022 0546   O2SAT 65.5 07/26/2022 0620   CBG (last 3)  No results for input(s): "GLUCAP" in the last 72 hours.  Assessment/Plan: S/P Procedure(s) (LRB): TRANSESOPHAGEAL ECHOCARDIOGRAM (TEE) (N/A) CARDIOVERSION (N/A)  CV: Severe back pain last night, CTA  showed no acute dissection. Hypotension last night, SBP 91 this AM. On midodrine. Converted from NSR to A fib with RVR around 4am last night. Afib with RVR, HR 110 this AM. On Amiodarone '200mg'$  QD. On eliquis '5mg'$ . Discussed with AHF....will start norepi, d/c PO amio and start amiodarone drip. Pulm: CXR shows increased bibasilar atelectasis and/or pleural effusions and pulmonary vascular congestion, mild asymmetric interstitial edema. Saturating 94% on RA. Continue IS. GI: Diarrhea stopped, some large bowel distension, no small bowel distension. Renal: CTA showed severe bladder distension 1662m. In and out catheter and foley inserted. Na 133. Cr 1.18. Hold diuresis due to hypotension ID: WBC 8.7 Extremities: RUE edematous, equal pulses bilaterally. AHF...will get RUE ultrasound. [facility]    LOS: 19 days    BMagdalene River PA-C 07/27/2022

## 2022-07-27 NOTE — Progress Notes (Addendum)
Physical Therapy Treatment Patient Details Name: Joe [name] MRN: [id number] DOB: [birth year] Today's Date: 07/27/2022   History of Present Illness 66 yo male admitted 9/27 for ascending aortic arch replacement with Bentall procedure and CABGx1 same date. Extubated 9/28. Ileus, Afib and CHF post procedure. PMHx: congenital brain damage, speech delay, bil THA, PLIF, Lt TSA, PVC    PT Comments    Pt with back pain today and return from 4E to [place] with current scans clear. Pt willing to attempt mobility limited by pain. Pt pleasant and reports mid thoracic cramping back pain 5/10 limited within pt tolerance. Pt educated for sternal precautions and able to walk limited distance but denied OOB to chair or further activity due to pain. RN present with VSS. Will continue to work toward increased mobility for return home.   HR 70-102 Pre gait 110/77 Post gait 105/83 97% on RA    Recommendations for follow up therapy are one component of a multi-disciplinary discharge planning process, led by the attending physician.  Recommendations may be updated based on patient status, additional functional criteria and insurance authorization.  Follow Up Recommendations  Home health PT     Assistance Recommended at Discharge Frequent or constant Supervision/Assistance  Patient can return home with the following A little help with walking and/or transfers;A little help with bathing/dressing/bathroom;Help with stairs or ramp for entrance;Assist for transportation;Assistance with feeding   Equipment Recommendations  None recommended by PT    Recommendations for Other Services       Precautions / Restrictions Precautions Precautions: Sternal;Fall Precaution Comments: frequent cueing for precautions Restrictions RUE Partial Weight Bearing Percentage or Pounds: sternal precautions LUE Partial Weight Bearing Percentage or Pounds: sternal precautions     Mobility  Bed Mobility Overal bed mobility:  Needs Assistance Bed Mobility: Rolling, Sidelying to Sit, Sit to Sidelying Rolling: Min guard Sidelying to sit: Min assist     Sit to sidelying: Min assist General bed mobility comments: min assist to elevate trunk from surface and to lift legs to return to bed, cues for precautions    Transfers Overall transfer level: Needs assistance   Transfers: Sit to/from Stand Sit to Stand: Min guard                Ambulation/Gait Ambulation/Gait assistance: Min guard Gait Distance (Feet): 75 Feet Assistive device: Rolling walker (2 wheels) Gait Pattern/deviations: Step-through pattern, Decreased stride length   Gait velocity interpretation: <1.31 ft/sec, indicative of household ambulator   General Gait Details: pt with decreased gait tolerance and self-limiting distance based on back pain, cues for direction   Stairs             Wheelchair Mobility    Modified Rankin (Stroke Patients Only)       Balance Overall balance assessment: Needs assistance Sitting-balance support: Feet supported, No upper extremity supported Sitting balance-Leahy Scale: Fair     Standing balance support: Bilateral upper extremity supported, Reliant on assistive device for balance Standing balance-Leahy Scale: Poor Standing balance comment: reliant on RW for stability in standing                            Cognition Arousal/Alertness: Awake/alert Behavior During Therapy: Flat affect Overall Cognitive Status: History of cognitive impairments - at baseline Area of Impairment: Orientation, Attention, Following commands                 Orientation Level: Time, Disoriented to Current Attention Level: Sustained  Memory: Decreased recall of precautions, Decreased short-term memory Following Commands: Follows one step commands consistently, Follows multi-step commands inconsistently, Follows one step commands with increased time       General Comments: pt baseline is  disoriented to time, day of week, year        Exercises      General Comments        Pertinent Vitals/Pain Pain Assessment Pain Assessment: 0-10 Pain Score: 5  Pain Location: mid thoracic Pain Descriptors / Indicators: Cramping Pain Intervention(s): Limited activity within patient's tolerance, Monitored during session, Repositioned, Patient requesting pain meds-RN notified    Home Living                          Prior Function            PT Goals (current goals can now be found in the care plan section) Progress towards PT goals: Not progressing toward goals - comment (limited by pain)    Frequency    Min 3X/week      PT Plan Current plan remains appropriate    Co-evaluation              AM-PAC PT "6 Clicks" Mobility   Outcome Measure  Help needed turning from your back to your side while in a flat bed without using bedrails?: A Little Help needed moving from lying on your back to sitting on the side of a flat bed without using bedrails?: A Little Help needed moving to and from a bed to a chair (including a wheelchair)?: A Little Help needed standing up from a chair using your arms (e.g., wheelchair or bedside chair)?: A Little Help needed to walk in hospital room?: A Little Help needed climbing 3-5 steps with a railing? : A Lot 6 Click Score: 17    End of Session Equipment Utilized During Treatment: Gait belt Activity Tolerance: Patient limited by pain Patient left: in bed;with call bell/phone within reach;with family/visitor present;with nursing/sitter in room Nurse Communication: Mobility status PT Visit Diagnosis: Other abnormalities of gait and mobility (R26.89);Muscle weakness (generalized) (M62.81);Unsteadiness on feet (R26.81)     Time: 1030-1049 PT Time Calculation (min) (ACUTE ONLY): 19 min  Charges:  $Therapeutic Activity: 8-22 mins                     Bayard Males, PT Acute Rehabilitation Services Office:  [phone]    Sandy Salaam Savio Albrecht 07/27/2022, 11:35 AM

## 2022-07-27 NOTE — Progress Notes (Signed)
Patient ID: Joe [name], male   DOB: 05-07-1956, 66 y.o.   MRN: [id number]  TCTS Evening Rounds:  Transferred to ICU this am due to hypotension with atrial fib and RVR. Also had a lot of back pain related to urinary retention.  Now off NE. Still in AF with rate 80 on IV amio.  CVP 15. Sats 94% RA

## 2022-07-27 NOTE — Progress Notes (Addendum)
Paged by nurse as patient complained of severe back pain and has diaphoresis this morning. On arrival, patient appears to be weak, eyes closed, diaphoretic. Complained of back pain moving down. He says he has chronic back pain in the same location, but this is a whole lot worse since last night. Telemetry showed patient went back to afib with HR 100-110s since 4AM this morning. Back pain has been worsening since prior to that, around 2AM per nurse. Wife at bed side. Ordered stat CT angio of chest/abdomen/pelvis. Nurse spoke with Dr. Lavonna Monarch who also recommended of CT of abdomen and pelvis.   SBP 80s soft, has been around 90s overnight. Given low EF, hesistant to give too much IVF. Instructed nurse to give 250 ml bolus, if SBP remain soft, may give another 250 cc bolus. Chest x ray already showing some mild CHF, patient is also complaining of dyspnea. Once BP comes up, may need a dose of IV diuretic.   Almyra Deforest PA-C   Addendum: Formal CT image report pending. Spoke with Dr. Gardiner Rhyme DOD and Dr. Haroldine Laws. Raw image reviewed, I did not see large dissection. Bladder is huge. Will proceed with bladder scan. Awaiting final radiologist report.

## 2022-07-27 NOTE — Progress Notes (Addendum)
07/27/2022  1938 Called Dr. Lavonna Monarch to verify use of PICC line is appropriate d/t results of ultrasound.   Dr. Lavonna Monarch acknowledged results and confirmed use of PICC line for meds/labs.

## 2022-07-27 NOTE — Progress Notes (Signed)
   Significant urinary retention.   UA with leukocytes. Rare bacteria.  Urine held for culture.   Add rocephin. Discussed with pharmacy.   Duwane Gewirtz NP-C  12:48 PM

## 2022-07-27 NOTE — Progress Notes (Signed)
Upper extremity venous has been completed.   Preliminary results in CV Proc.   Jinny Blossom Nishita Isaacks 07/27/2022 2:36 PM

## 2022-07-27 NOTE — Progress Notes (Signed)
[address]       [place], [address]             [phone]      4 Days Post-Op  Procedure(s) (LRB): TRANSESOPHAGEAL ECHOCARDIOGRAM (TEE) (N/A) CARDIOVERSION (N/A)   Total Length of Stay:  LOS: 19 days    SUBJECTIVE: Severe back pain overnight not relieved with pain meds.  Had CT scan without dissection but with large full bladder Pain still with bladder being emptied Hypotensive with return to afib with rates in 110's Vitals:   07/27/22 0557 07/27/22 0600  BP: 92/65 91/78  Pulse: 76 (!) 115  Resp: (!) 22 (!) 24  Temp:  97.9 F (36.6 C)  SpO2: 93% 94%    Intake/Output      10/15 0701 10/16 0700 10/16 0701 10/17 0700   P.O. 550    I.V. (mL/kg) 10 (0.1)    Total Intake(mL/kg) 560 (7.5)    Urine (mL/kg/hr) 3450 (1.9) 1300 (18.6)   Total Output 3450 1300   Net -2890 -1300            sodium chloride Stopped (07/22/22 1203)   albumin human     amiodarone     Followed by   amiodarone     promethazine (PHENERGAN) injection (IM or IVPB)     sodium chloride      CBC    Component Value Date/Time   WBC 8.7 07/27/2022 0520   RBC 3.02 (L) 07/27/2022 0520   HGB 9.8 (L) 07/27/2022 0520   HGB 14.8 09/30/2018 1524   HCT 29.2 (L) 07/27/2022 0520   HCT 42.2 09/30/2018 1524   PLT 431 (H) 07/27/2022 0520   PLT 231 09/30/2018 1524   MCV 96.7 07/27/2022 0520   MCV 101 (H) 09/30/2018 1524   MCH 32.5 07/27/2022 0520   MCHC 33.6 07/27/2022 0520   RDW 15.0 07/27/2022 0520   RDW 12.8 09/30/2018 1524   LYMPHSABS 0.8 07/27/2022 0520   MONOABS 0.8 07/27/2022 0520   EOSABS 0.0 07/27/2022 0520   BASOSABS 0.0 07/27/2022 0520   CMP     Component Value Date/Time   NA 133 (L) 07/27/2022 0440   NA 141 10/11/2019 1521   K 3.6 07/27/2022 0440   CL 88 (L) 07/27/2022 0440   CO2 30 07/27/2022 0440   GLUCOSE 100 (H) 07/27/2022 0440   BUN 17 07/27/2022 0440   BUN 13 10/11/2019 1521   CREATININE 1.18 07/27/2022 0440   CALCIUM 9.1 07/27/2022 0440    PROT 5.5 (L) 07/15/2022 1604   ALBUMIN 2.5 (L) 07/15/2022 1604   AST 32 07/15/2022 1604   ALT 25 07/15/2022 1604   ALKPHOS 48 07/15/2022 1604   BILITOT 1.0 07/15/2022 1604   GFRNONAA >60 07/27/2022 0440   GFRAA 108 10/11/2019 1521   ABG    Component Value Date/Time   PHART 7.342 (L) 07/10/2022 0546   PCO2ART 43.3 07/10/2022 0546   PO2ART 71 (L) 07/10/2022 0546   HCO3 23.4 07/10/2022 0546   TCO2 30 07/16/2022 0711   ACIDBASEDEF 2.0 07/10/2022 0546   O2SAT 65.5 07/26/2022 0620   CBG (last 3)  No results for input(s): "GLUCAP" in the last 72 hours.  EXAM Lungs: overall clear Card: IRR with no murmur Ext: warm and dry Abd: soft nontender  ASSESSMENT: SP Bental procedure CABx! Epicardial pacing wires Back Pain: most likely due to bladder distention but concerning not much better after foley placed. With hypotension, afib and uncertain etiology  of pain, will move to 2H for closer monitoring. Official read on CT without dissection, large bladder, stool in colon CHF: with hypotension will hold on diuresis today Afib: recurrent, will discuss with EP as to rate control, amiodorone and follow for now   Joe Common, MD '@DATE'$ @

## 2022-07-27 NOTE — Progress Notes (Addendum)
Patient ID: Joe [name], male   DOB: [birth year], 66 y.o.   MRN: [id number]     Advanced Heart Failure Rounding Note  PCP-Cardiologist: Skeet Latch, MD   Subjective:    9/27 s/p Bentall with bioprosthetic aortic valve . Post-op course c/b ileus.   S/p DCCV 10/12--> NSR/SB  10/15 Midodrine cut back to 5 mg twice a day.   Earlier this morning developed severe back pain. Stat CT abd/pelvis. No dissection.  Massive bladder distention ~1600 cc. Retained stool ascending colon. Hypotensive. Foley placed. >1300 cc out. Given albumin.   Complaining of back and right arm pain.    Objective:   Weight Range: 75 kg Body mass index is 25.9 kg/m.   Vital Signs:   Temp:  [97.6 F (36.4 C)-98.2 F (36.8 C)] 97.9 F (36.6 C) (10/16 0600) Pulse Rate:  [60-115] 115 (10/16 0600) Resp:  [17-26] 24 (10/16 0600) BP: (91-117)/(60-78) 91/78 (10/16 0600) SpO2:  [92 %-100 %] 94 % (10/16 0600) Last BM Date : 07/25/22  Weight change: Filed Weights   07/24/22 0500 07/25/22 0500 07/26/22 0441  Weight: 77.6 kg 75.5 kg 75 kg    Intake/Output:   Intake/Output Summary (Last 24 hours) at 07/27/2022 0747 Last data filed at 07/27/2022 0710 Gross per 24 hour  Intake 560 ml  Output 4750 ml  Net -4190 ml      Physical Exam  General:  Appears weak.  No resp difficulty HEENT: normal Neck: supple. JVP 6-7. Carotids 2+ bilat; no bruits. No lymphadenopathy or thryomegaly appreciated. Cor: PMI nondisplaced. Tachy Irregular & rhythm. No rubs, gallops or murmurs. Sternal incision approximated  Lungs: clear Abdomen: soft, tender, nondistended. No hepatosplenomegaly. No bruits or masses. Sluggish bowel sounds. . Extremities: no cyanosis, clubbing, rash, RUE painful 2+ edema. RUE PICC Neuro: alert & orientedx3, cranial nerves grossly intact. moves all 4 extremities w/o difficulty. Affect flat GU: foley -  Telemetry    Went in A fib RVR 90-100s around 0650 this morning.   EKG---> Afib 95 bpm  personally reviewed.   Labs    CBC Recent Labs    07/25/22 0500 07/27/22 0520  WBC 8.8 8.7  NEUTROABS  --  7.0  HGB 8.8* 9.8*  HCT 25.5* 29.2*  MCV 96.2 96.7  PLT 362 448*   Basic Metabolic Panel Recent Labs    07/25/22 0500 07/27/22 0440  NA 134* 133*  K 3.5 3.6  CL 91* 88*  CO2 31 30  GLUCOSE 103* 100*  BUN 14 17  CREATININE 1.00 1.18  CALCIUM 8.5* 9.1   Liver Function Tests No results for input(s): "AST", "ALT", "ALKPHOS", "BILITOT", "PROT", "ALBUMIN" in the last 72 hours.  BNP: BNP (last 3 results) Recent Labs    06/25/22 1829  BNP 436.2*     Imaging  reviewed  Medications:     Scheduled Medications:  amiodarone  200 mg Oral Daily   apixaban  5 mg Oral BID   aspirin EC  81 mg Oral Daily   atorvastatin  80 mg Oral Daily   Chlorhexidine Gluconate Cloth  6 each Topical Daily   midodrine  5 mg Oral BID WC   morphine (PF)       pantoprazole  40 mg Oral Daily   potassium chloride  40 mEq Oral BID   senna-docusate  1 tablet Oral QHS   sodium chloride flush  10-40 mL Intracatheter Q12H   sodium chloride flush  3 mL Intravenous Q12H   tamsulosin  0.4 mg Oral  BID   torsemide  40 mg Oral BID    Infusions:  sodium chloride Stopped (07/22/22 1203)   albumin human     promethazine (PHENERGAN) injection (IM or IVPB)     sodium chloride      PRN Medications: sodium chloride, acetaminophen, bisacodyl, hydrocortisone, hydrocortisone, melatonin, morphine (PF), ondansetron (ZOFRAN) IV, mouth rinse, polyethylene glycol, promethazine (PHENERGAN) injection (IM or IVPB), sodium chloride flush, sodium chloride flush, traMADol    Patient Profile  66 y/o male recently found to have severe LV dysfunction in setting of markedly dilated aortic root (67 mm) and severe AI (tricuspid AoV) and single vessel CAD, now s/p Bentall and CABG x 1 w/ SVG-RCA. AHF team consulted to assist w/ post-operative management of systolic heart failure.     Assessment/Plan    Ascending Aortic Aneurysm/ Severe AI  - CTA  w/ severe dilation at the sinus of Valsalva, 67 mm. Moderate dilation of mid ascending aorta, 44 mm. Tricuspid AoV - s/p Bentall with bioprosthetic aortic valve 9/27 - intraoperative TEE, no perivalvular leak post repair  - Echo 10/10 EF 25% RV function improving. Moderate to severe MR (in setting of LBBB) AVR ok   - CTA 10/16 negative for dissection.  - Hypotensive today. Looking back he has been hypotensive postoperatively. Getting albumin now. Suspect recurrent A fib RVR playing a role.  - Adding norepi.    2. CAD - single vessel, 60-70% pRCA  - s/p CABG x 1, SVG- RCA  - ASA + statin  - No BB for now.     3. Chronic Systolic Heart Failure  - newly diagnosed; Echo 9/23 EF 25-30%, LV severely dilated, severe AI, Global HK, RV ok  - NICM, severity out of proportion to degree of CAD  - likely valvular CM in setting of severe AI +/- LBBB CM  - now post op bioprosthetic AVR + CABG. Intraoperative TEE LVEF 20-25%, RV ok - With junctional rhythm, will hold off on beta blocker and digoxin for now to avoid worsening A-V conduction. Eventual CRT-D (see below).  - Hypotensive 10/04 in setting of urinary retention.  - Echo 10/9- EF 25% RV function improving. Moderate to severe MR (in setting of LBBB) AVR ok   - Recurrent hypotension today suspect in the setting of urinary retention and recurrent A fib. . Given albumin and will start norep.   - Check CO-OX now. Check CVP --->6  - GDMT limited due to hypotension.   -Increase midodrine 5 mg three times a day for BP support.  4. Junctional rhythm - Resolved  - Has epicardial LV lead. EP following. Had been considering CRT-D prior to discharge but given need for DC-CV and prolonged hospitalization likely better wait at least several weeks until he recovers   5. LBBB -Will eventually need epicardial lead hooked up for CRT see discussion above   6. Anemia - Post-op, 1 u RBCs 10/04. Hgb 8.4 on  10/11 - Hgb stable today.   7. Thrombocytopenia - Resolved.  8. Atrial fib/flutter, post-op - s/p DCCV 10/12>>NSR  - Earlier this morning back in A fib RVR.  - Stop po amio. Give 150 mg bolus now followed by amio infusion 60 mg per hour.  - Eliquis 5 mg bid   9. Urinary retention - Followed by [facility] every 6 month and has been on flomax for some time.  - Recurrent bladder distention today. CT abd--> 1600 cc urine.  Foley replaced  with 1300 cc out.  - Check  UA now.    10. Post-op ileus/ Constipation  - 10/9 KUB c/w ileus   - Last BM 10/12 -- Ongoing bowel distention on CT.   11. Hypokalemia - K 3.6. Supp PRN   12. Hyponatremia - Na 133  13. Back Pain  CTA- negative for dissection   14. RUE Pain -RUE 2+ edema . Tender to tough - Check [place]  Check CO-OX, lactic acid, UA, blood cx x2. Add norepi followed by amio drip.    EKG--> A fib   CVP 6   Moved to 2H.   Length of Stay: [place], NP-C   7:47 AM  Agree with above.   Developed back pain and hypotension this am. Stat CT with no dissection. + evidence of urinary retention. BP now improved on low-dose NE. Foley paced with ~1.5L out.  UA positive. Back in AF. Co-ox ok.   General:  Weak appearing. No resp difficulty HEENT: normal Neck: supple. no JVD. Carotids 2+ bilat; no bruits. No lymphadenopathy or thryomegaly appreciated. Cor: PMI nondisplaced. Irregular rate & rhythm. No rubs, gallops or murmurs. Lungs: clear Abdomen: soft, nontender, nondistended. No hepatosplenomegaly. No bruits or masses. Good bowel sounds. Extremities: no cyanosis, clubbing, rash, edema Neuro: alert & orientedx3, cranial nerves grossly intact. moves all 4 extremities w/o difficulty. Affect pleasant  CT reassuring. Suspect most of symptoms due to urinary retention. Bladder now decompressed. Wean NE as tolerated. Start abx. Follow Ucx. Can consider repeat DC-CV prior to d/c.   CRITICAL CARE Performed by: Glori Bickers  Total critical care time: 35 minutes  Critical care time was exclusive of separately billable procedures and treating other patients.  Critical care was necessary to treat or prevent imminent or life-threatening deterioration.  Critical care was time spent personally by me (independent of midlevel providers or residents) on the following activities: development of treatment plan with patient and/or surrogate as well as nursing, discussions with consultants, evaluation of patient's response to treatment, examination of patient, obtaining history from patient or surrogate, ordering and performing treatments and interventions, ordering and review of laboratory studies, ordering and review of radiographic studies, pulse oximetry and re-evaluation of patient's condition.  Glori Bickers, MD  5:20 PM

## 2022-07-27 NOTE — Significant Event (Signed)
Rapid Response Event Note   Reason for Call :  10/10 back pain  Per pt, back pain started around 2000 last night and gradually worsened until around 0200 when it suddenly became 10/10. Pt describes this pain as throbbing in his mid back area.   Initial Focused Assessment:  Pt lying in bed with eyes open, moaning. He is alert and oriented, c/o 10/10 throbbing pain in his mid back. Lungs CTA, decreased in the bases. ABD distended but soft. B radial pulses strong and equal. Skin pale, warm and diaphoretic.   HR-110(Afib), BP-91/64, RR-28, SpO2-95% on RA.  SBP-70-80s en route and upon return from CT scan: 250cc NS bolus started in CT, albumin ordered on return from CT.  Interventions:  PCXR-1. Lower lung volumes with evidence of increased bibasilar atelectasis and/or pleural effusions. 2. Asymmetric pulmonary vascular congestion greater on the right. Consider mild asymmetric interstitial edema. 3. Stable cardiomegaly. CBC-hgb-9.8 EKG-Afib RVR with premature ventricular or aberrantly conducted complexes, LBBB CT chest/abd/pelvis with contrast-negative for AAA or dissection, distended bladder: I&O cath done, Foley inserted, '2mg'$  morphine given  250cc 5% albumin Tx to [place] of Care:  Per pt, pain has not gotten any better since placing foley and decompressing bladder(still 10/10 throbbing). SBP-70s after 250cc NS bolus. Albumin ordered. Will transfer to [facility] for closer monitoring.  Event Summary:   MD Notified: Eulas Post, [place] notified and came to bedside. Dr. Lavonna Monarch notified and came to bedside.  Call [place] handed off to [place] and [place], [place] RNs at [address] [place], [place]

## 2022-07-28 ENCOUNTER — Inpatient Hospital Stay: Payer: Self-pay

## 2022-07-28 ENCOUNTER — Inpatient Hospital Stay (HOSPITAL_COMMUNITY): Payer: Medicare Other

## 2022-07-28 ENCOUNTER — Encounter (HOSPITAL_COMMUNITY): Payer: Self-pay | Admitting: Internal Medicine

## 2022-07-28 ENCOUNTER — Ambulatory Visit (HOSPITAL_BASED_OUTPATIENT_CLINIC_OR_DEPARTMENT_OTHER): Payer: Medicare Other | Admitting: Family

## 2022-07-28 DIAGNOSIS — I714 Abdominal aortic aneurysm, without rupture, unspecified: Secondary | ICD-10-CM | POA: Diagnosis not present

## 2022-07-28 DIAGNOSIS — I350 Nonrheumatic aortic (valve) stenosis: Secondary | ICD-10-CM | POA: Diagnosis not present

## 2022-07-28 DIAGNOSIS — Z95828 Presence of other vascular implants and grafts: Secondary | ICD-10-CM | POA: Diagnosis not present

## 2022-07-28 DIAGNOSIS — I5022 Chronic systolic (congestive) heart failure: Secondary | ICD-10-CM | POA: Diagnosis not present

## 2022-07-28 LAB — MAGNESIUM: Magnesium: 2.2 mg/dL (ref 1.7–2.4)

## 2022-07-28 LAB — BASIC METABOLIC PANEL
Anion gap: 12 (ref 5–15)
BUN: 17 mg/dL (ref 8–23)
CO2: 31 mmol/L (ref 22–32)
Calcium: 8.9 mg/dL (ref 8.9–10.3)
Chloride: 91 mmol/L — ABNORMAL LOW (ref 98–111)
Creatinine, Ser: 1.01 mg/dL (ref 0.61–1.24)
GFR, Estimated: >60 mL/min (ref >60)
Glucose, Bld: 106 mg/dL — ABNORMAL HIGH (ref 70–99)
Potassium: 4.2 mmol/L (ref 3.5–5.1)
Sodium: 134 mmol/L — ABNORMAL LOW (ref 135–145)

## 2022-07-28 LAB — CBC WITH DIFFERENTIAL/PLATELET
Abs Immature Granulocytes: 0.02 10*3/uL (ref 0.00–0.07)
Basophils Absolute: 0 10*3/uL (ref 0.0–0.1)
Basophils Relative: 0 %
Eosinophils Absolute: 0.1 10*3/uL (ref 0.0–0.5)
Eosinophils Relative: 1 %
HCT: 26.9 % — ABNORMAL LOW (ref 39.0–52.0)
Hemoglobin: 8.9 g/dL — ABNORMAL LOW (ref 13.0–17.0)
Immature Granulocytes: 0 %
Lymphocytes Relative: 10 %
Lymphs Abs: 0.6 10*3/uL — ABNORMAL LOW (ref 0.7–4.0)
MCH: 32.6 pg (ref 26.0–34.0)
MCHC: 33.1 g/dL (ref 30.0–36.0)
MCV: 98.5 fL (ref 80.0–100.0)
Monocytes Absolute: 0.6 10*3/uL (ref 0.1–1.0)
Monocytes Relative: 9 %
Neutro Abs: 5 10*3/uL (ref 1.7–7.7)
Neutrophils Relative %: 80 %
Platelets: 394 10*3/uL (ref 150–400)
RBC: 2.73 MIL/uL — ABNORMAL LOW (ref 4.22–5.81)
RDW: 15 % (ref 11.5–15.5)
WBC: 6.3 10*3/uL (ref 4.0–10.5)
nRBC: 0 % (ref 0.0–0.2)

## 2022-07-28 LAB — COOXEMETRY PANEL
Carboxyhemoglobin: 2.1 % — ABNORMAL HIGH (ref 0.5–1.5)
Methemoglobin: <0.7 % (ref 0.0–1.5)
O2 Saturation: 67.2 %
Total hemoglobin: 9.1 g/dL — ABNORMAL LOW (ref 12.0–16.0)

## 2022-07-28 MED ORDER — DIAZEPAM 2 MG PO TABS
2.0000 mg | ORAL_TABLET | Freq: Four times a day (QID) | ORAL | Status: DC | PRN
  Administered 2022-07-30 – 2022-08-08 (×3): 2 mg via ORAL
  Filled 2022-07-28 (×3): qty 1

## 2022-07-28 MED ORDER — SORBITOL 70 % SOLN
30.0000 mL | Freq: Once | Status: AC
  Administered 2022-07-28: 30 mL via ORAL
  Filled 2022-07-28: qty 30

## 2022-07-28 MED ORDER — SENNOSIDES-DOCUSATE SODIUM 8.6-50 MG PO TABS
2.0000 | ORAL_TABLET | Freq: Two times a day (BID) | ORAL | Status: DC
  Administered 2022-07-28 – 2022-07-30 (×4): 2 via ORAL
  Filled 2022-07-28 (×5): qty 2

## 2022-07-28 MED ORDER — AMIODARONE HCL 200 MG PO TABS
400.0000 mg | ORAL_TABLET | Freq: Two times a day (BID) | ORAL | Status: DC
  Administered 2022-07-28 – 2022-07-31 (×7): 400 mg via ORAL
  Filled 2022-07-28 (×7): qty 2

## 2022-07-28 MED ORDER — POLYETHYLENE GLYCOL 3350 17 G PO PACK
17.0000 g | PACK | Freq: Two times a day (BID) | ORAL | Status: DC
  Administered 2022-07-28 – 2022-07-30 (×4): 17 g via ORAL
  Filled 2022-07-28 (×5): qty 1

## 2022-07-28 MED ORDER — METOLAZONE 5 MG PO TABS
5.0000 mg | ORAL_TABLET | Freq: Once | ORAL | Status: AC
  Administered 2022-07-28: 5 mg via ORAL
  Filled 2022-07-28: qty 1

## 2022-07-28 MED ORDER — TORSEMIDE 20 MG PO TABS
40.0000 mg | ORAL_TABLET | Freq: Two times a day (BID) | ORAL | Status: DC
  Administered 2022-07-28 – 2022-07-29 (×3): 40 mg via ORAL
  Filled 2022-07-28 (×3): qty 2

## 2022-07-28 NOTE — Progress Notes (Signed)
[address]       [place] [address]             [phone]      5 Days Post-Op  Procedure(s) (LRB): TRANSESOPHAGEAL ECHOCARDIOGRAM (TEE) (N/A) CARDIOVERSION (N/A)   Total Length of Stay:  LOS: 20 days    SUBJECTIVE: Had another episode of the back pain last night Valium helped Has some pain this am Vitals:   07/28/22 0630 07/28/22 0645  BP: 102/65 100/74  Pulse: 80 79  Resp: (!) 8 16  Temp:    SpO2: 98% 99%    Intake/Output      10/16 0701 10/17 0700 10/17 0701 10/18 0700   P.O.     I.V. (mL/kg) 629.4 (8.8)    IV Piggyback 294    Total Intake(mL/kg) 923.4 (12.9)    Urine (mL/kg/hr) 2400 (1.4)    Total Output 2400    Net -1476.6             sodium chloride Stopped (07/22/22 1203)   amiodarone 30 mg/hr (07/28/22 0600)   cefTRIAXone (ROCEPHIN)  IV Stopped (07/27/22 1347)   norepinephrine (LEVOPHED) Adult infusion 1 mcg/min (07/28/22 0600)   promethazine (PHENERGAN) injection (IM or IVPB)     sodium chloride      CBC    Component Value Date/Time   WBC 6.3 07/28/2022 0422   RBC 2.73 (L) 07/28/2022 0422   HGB 8.9 (L) 07/28/2022 0422   HGB 14.8 09/30/2018 1524   HCT 26.9 (L) 07/28/2022 0422   HCT 42.2 09/30/2018 1524   PLT 394 07/28/2022 0422   PLT 231 09/30/2018 1524   MCV 98.5 07/28/2022 0422   MCV 101 (H) 09/30/2018 1524   MCH 32.6 07/28/2022 0422   MCHC 33.1 07/28/2022 0422   RDW 15.0 07/28/2022 0422   RDW 12.8 09/30/2018 1524   LYMPHSABS 0.6 (L) 07/28/2022 0422   MONOABS 0.6 07/28/2022 0422   EOSABS 0.1 07/28/2022 0422   BASOSABS 0.0 07/28/2022 0422   CMP     Component Value Date/Time   NA 134 (L) 07/28/2022 0422   NA 141 10/11/2019 1521   K 4.2 07/28/2022 0422   CL 91 (L) 07/28/2022 0422   CO2 31 07/28/2022 0422   GLUCOSE 106 (H) 07/28/2022 0422   BUN 17 07/28/2022 0422   BUN 13 10/11/2019 1521   CREATININE 1.01 07/28/2022 0422   CALCIUM 8.9 07/28/2022 0422   PROT 5.5 (L) 07/15/2022 1604   ALBUMIN 2.5  (L) 07/15/2022 1604   AST 32 07/15/2022 1604   ALT 25 07/15/2022 1604   ALKPHOS 48 07/15/2022 1604   BILITOT 1.0 07/15/2022 1604   GFRNONAA >60 07/28/2022 0422   GFRAA 108 10/11/2019 1521   ABG    Component Value Date/Time   PHART 7.342 (L) 07/10/2022 0546   PCO2ART 43.3 07/10/2022 0546   PO2ART 71 (L) 07/10/2022 0546   HCO3 23.4 07/10/2022 0546   TCO2 30 07/16/2022 0711   ACIDBASEDEF 2.0 07/10/2022 0546   O2SAT 67.2 07/28/2022 0422   CBG (last 3)  No results for input(s): "GLUCAP" in the last 72 hours. EXAM Lungs: decreased at bases Card: Tachy, no murmurs Ext: 2+ edema  warm Abd: soft tympaninic  ASSESSMENT: POD#20 Bental/CAB/epicardial pacing wires Bladder distention- will have urology involved  for timing of foley removal and follow up after DC Right arm swelling-need to remove PICC and replace on other side CHF- still with edema. Restart torsemide today.  AFIB- on  amio drip, check with EP on plans to control rhythm Back Spasm- ? Etiology will have valium prn Constipation-needs ongoing bowel treatment   Coralie Common, MD '@DATE'$ @

## 2022-07-28 NOTE — Progress Notes (Signed)
     [address]       [place] [address]             [phone]       EVENING ROUNDS Has responded to [facility] with good urine output Has received new picc line Off all infusions Needs bowel movement Hopefully can get cardioverted again Follow diet

## 2022-07-28 NOTE — Progress Notes (Addendum)
Patient ID: Joe [name], male   DOB: [birth year], 66 y.o.   MRN: [id number]     Advanced Heart Failure Rounding Note  PCP-Cardiologist: Skeet Latch, MD   Subjective:    9/27 s/p Bentall with bioprosthetic aortic valve . Post-op course c/b ileus.   S/p DCCV 10/12--> NSR/SB  10/16 developed back pain and hypotension. Stat CT with no dissection. + evidence of urinary retention. Transferred back to CCU, started on NE. Foley paced with ~1.5L out.  UA positive, started on rocephin. Back in Afib, placed back on amio gtt.   BP improved, now off NE. On Midodrine 10 tid, SPB upper 90s.   Remains in Afib, V-rates 80s- 90s. On amio gtt 30/hr   Co-ox 67%, CVP 7   C/w constipation. Not eating much, nausea w/ PO intake. C/w foley. Primary team consulting urology.   RUE [place] + for PICC associated DVT   Objective:   Weight Range: 71.5 kg Body mass index is 24.69 kg/m.   Vital Signs:   Temp:  [98 F (36.7 C)-98.4 F (36.9 C)] 98.2 F (36.8 C) (10/17 0400) Pulse Rate:  [62-177] 154 (10/17 0800) Resp:  [8-25] 20 (10/17 0800) BP: (73-128)/(59-107) 105/71 (10/17 0800) SpO2:  [86 %-100 %] 99 % (10/17 0800) Weight:  [71.5 kg] 71.5 kg (10/17 0500) Last BM Date : 07/25/22  Weight change: Filed Weights   07/25/22 0500 07/26/22 0441 07/28/22 0500  Weight: 75.5 kg 75 kg 71.5 kg    Intake/Output:   Intake/Output Summary (Last 24 hours) at 07/28/2022 0816 Last data filed at 07/28/2022 0800 Gross per 24 hour  Intake 962.52 ml  Output 1100 ml  Net -137.48 ml      Physical Exam   CVP 7  General:  Well appearing, sitting up in chair. No respiratory difficulty HEENT: normal Neck: supple. JVD 7 cm. Carotids 2+ bilat; no bruits. No lymphadenopathy or thyromegaly appreciated. Cor: PMI nondisplaced. Irregularly irregular rhythm and rate. No rubs, gallops or murmurs. +sternotomy site ok.  Lungs: clear Abdomen: soft, nontender, +distended. No hepatosplenomegaly. No bruits or masses.  Good bowel sounds. Extremities: no cyanosis, clubbing, rash, trace b/l pretibial edema Neuro: alert & oriented x 3, cranial nerves grossly intact. moves all 4 extremities w/o difficulty. Affect pleasant.   Telemetry   Afib 80s-90s, personally reviewed   Labs    CBC Recent Labs    07/27/22 0520 07/28/22 0422  WBC 8.7 6.3  NEUTROABS 7.0 5.0  HGB 9.8* 8.9*  HCT 29.2* 26.9*  MCV 96.7 98.5  PLT 431* 341   Basic Metabolic Panel Recent Labs    07/27/22 0440 07/28/22 0422  NA 133* 134*  K 3.6 4.2  CL 88* 91*  CO2 30 31  GLUCOSE 100* 106*  BUN 17 17  CREATININE 1.18 1.01  CALCIUM 9.1 8.9   Liver Function Tests No results for input(s): "AST", "ALT", "ALKPHOS", "BILITOT", "PROT", "ALBUMIN" in the last 72 hours.  BNP: BNP (last 3 results) Recent Labs    06/25/22 1829  BNP 436.2*     Imaging  reviewed  Medications:     Scheduled Medications:  apixaban  5 mg Oral BID   aspirin EC  81 mg Oral Daily   atorvastatin  80 mg Oral Daily   Chlorhexidine Gluconate Cloth  6 each Topical Daily   midodrine  10 mg Oral TID WC   pantoprazole  40 mg Oral Daily   potassium chloride  40 mEq Oral BID   senna-docusate  1 tablet  Oral QHS   sodium chloride flush  10-40 mL Intracatheter Q12H   sodium chloride flush  3 mL Intravenous Q12H   tamsulosin  0.4 mg Oral BID   torsemide  40 mg Oral BID    Infusions:  sodium chloride Stopped (07/22/22 1203)   amiodarone 30 mg/hr (07/28/22 0800)   cefTRIAXone (ROCEPHIN)  IV Stopped (07/27/22 1347)   norepinephrine (LEVOPHED) Adult infusion Stopped (07/28/22 0732)   promethazine (PHENERGAN) injection (IM or IVPB)     sodium chloride      PRN Medications: sodium chloride, acetaminophen, bisacodyl, diazepam, hydrocortisone, hydrocortisone, melatonin, ondansetron (ZOFRAN) IV, mouth rinse, polyethylene glycol, promethazine (PHENERGAN) injection (IM or IVPB), sodium chloride flush, sodium chloride flush, traMADol    Patient Profile   66 y/o male recently found to have severe LV dysfunction in setting of markedly dilated aortic root (67 mm) and severe AI (tricuspid AoV) and single vessel CAD, now s/p Bentall and CABG x 1 w/ SVG-RCA. AHF team consulted to assist w/ post-operative management of systolic heart failure.     Assessment/Plan   Ascending Aortic Aneurysm/ Severe AI  - CTA  w/ severe dilation at the sinus of Valsalva, 67 mm. Moderate dilation of mid ascending aorta, 44 mm. Tricuspid AoV - s/p Bentall with bioprosthetic aortic valve 9/27 - intraoperative TEE, no perivalvular leak post repair  - Echo 10/10 EF 25% RV function improving. Moderate to severe MR (in setting of LBBB) AVR ok   - CTA 10/16 negative for dissection.    2. CAD - single vessel, 60-70% pRCA  - s/p CABG x 1, SVG- RCA  - ASA + statin  - No BB for now.     3. Chronic Systolic Heart Failure  - newly diagnosed; Echo 9/23 EF 25-30%, LV severely dilated, severe AI, Global HK, RV ok  - NICM, severity out of proportion to degree of CAD  - likely valvular CM in setting of severe AI +/- LBBB CM  - now post op bioprosthetic AVR + CABG. Intraoperative TEE LVEF 20-25%, RV ok - With junctional rhythm, will hold off on beta blocker and digoxin for now to avoid worsening A-V conduction. Eventual CRT-D (see below).  - Hypotensive 10/04 in setting of urinary retention.  - Echo 10/9- EF 25% RV function improving. Moderate to severe MR (in setting of LBBB) AVR ok   - Co-ox 67%, CVP 7  - Restart Torsemide 40 mg bid  - GDMT limited due to hypotension.   - Continue midodrine 10 mg three times a day for BP support. - ok to pull PICC today   4. Junctional rhythm - Resolved  - Has epicardial LV lead. EP following. Had been considering CRT-D prior to discharge but given need for DC-CV and prolonged hospitalization likely better wait at least several weeks until he recovers   5. LBBB -Will eventually need epicardial lead hooked up for CRT see discussion above    6. Anemia - Post-op, 1 u RBCs 10/04. - Hgb stable today.   7. Thrombocytopenia - Resolved.  8. Atrial fib/flutter, post-op - s/p DCCV 10/12>>NSR  - Back in A fib, rates 80s-90s   - Transition back to PO amio 400 mg bid  - Can consider repeat DC-CV prior to d/c. - Eliquis 5 mg bid   9. Urinary retention/UTI  - Followed by [facility] every 6 month and has been on flomax for some time.  - Recurrent bladder distention. CT abd--> 1600 cc urine.  Foley replaced  with 1300  cc out.  - Check UA +. Started on Rocephin  - Continues w/ foley. Primary team consulting Urology   10. Post-op ileus/ Constipation  - 10/9 KUB c/w ileus   - Last BM 10/13 - Ongoing bowel distention on CT.   11. Hypokalemia - resolved, 4.2 today   12. Hyponatremia - Na 134  13. Back Pain  - CTA- negative for dissection  - suspect 2/2 bladder distention   14. RUE Pain/ PICC Line Associated DVT  -RUE 2+ edema . Tender to tough -[place] + for Acute nonocclusive DVT surrounding the picc line in the subclavian vein -Plan to pull PICC today -Already on Eliquis for Afib   Length of Stay: [place], PA-C  8:16 AM   Patient seen and examined with the above-signed Advanced Practice Provider and/or Housestaff. I personally reviewed laboratory data, imaging studies and relevant notes. I independently examined the patient and formulated the important aspects of the plan. I have edited the note to reflect any of my changes or salient points. I have personally discussed the plan with the patient and/or family.  Off NE. On midodrine for BP support. Still feels weak. Had another episode of back pain last night. Received valium.  Remains in AF but rate controlled. On Eliquis. Remains with Foley for urinary retention. C/o constipation again. Co-ox ok CVP 7  U/s c/w PICC associated DVT. No obvious PE on CT  General:  Weak appearing. No resp difficulty HEENT: normal Neck: supple. JVP 7 Carotids 2+  bilat; no bruits. No lymphadenopathy or thryomegaly appreciated. Cor: PMI nondisplaced. Irregular rate & rhythm. No rubs, gallops or murmurs. Lungs: clear Abdomen: soft, nontender, + distended. No hepatosplenomegaly. No bruits or masses. Good bowel sounds. Extremities: no cyanosis, clubbing, rash, 1+ edema RUE edematous  Neuro: alert & orientedx3, cranial nerves grossly intact. moves all 4 extremities w/o difficulty. Affect pleasant  Remains volume overloaded. Agree with restarting torsemide. Will give him a dose of metolazone today. Replace PICC (I d/w PICC team personally). Continue amio. Can consider repeat DC-CV prior to discharge.  Resume bowel regimen. D/w PharmD  Glori Bickers, MD  9:38 AM

## 2022-07-29 DIAGNOSIS — I719 Aortic aneurysm of unspecified site, without rupture: Secondary | ICD-10-CM | POA: Diagnosis not present

## 2022-07-29 DIAGNOSIS — I5022 Chronic systolic (congestive) heart failure: Secondary | ICD-10-CM | POA: Diagnosis not present

## 2022-07-29 DIAGNOSIS — Z95828 Presence of other vascular implants and grafts: Secondary | ICD-10-CM | POA: Diagnosis not present

## 2022-07-29 LAB — BASIC METABOLIC PANEL
Anion gap: 15 (ref 5–15)
Anion gap: 12 (ref 5–15)
BUN: 22 mg/dL (ref 8–23)
BUN: 24 mg/dL — ABNORMAL HIGH (ref 8–23)
CO2: 37 mmol/L — ABNORMAL HIGH (ref 22–32)
CO2: 40 mmol/L — ABNORMAL HIGH (ref 22–32)
Calcium: 9.5 mg/dL (ref 8.9–10.3)
Calcium: 8.8 mg/dL — ABNORMAL LOW (ref 8.9–10.3)
Chloride: 82 mmol/L — ABNORMAL LOW (ref 98–111)
Chloride: 81 mmol/L — ABNORMAL LOW (ref 98–111)
Creatinine, Ser: 1.17 mg/dL (ref 0.61–1.24)
Creatinine, Ser: 1.33 mg/dL — ABNORMAL HIGH (ref 0.61–1.24)
GFR, Estimated: >60 mL/min (ref >60)
GFR, Estimated: 58 mL/min — ABNORMAL LOW (ref >60)
Glucose, Bld: 115 mg/dL — ABNORMAL HIGH (ref 70–99)
Glucose, Bld: 117 mg/dL — ABNORMAL HIGH (ref 70–99)
Potassium: 2.6 mmol/L — CL (ref 3.5–5.1)
Potassium: 2.5 mmol/L — CL (ref 3.5–5.1)
Sodium: 134 mmol/L — ABNORMAL LOW (ref 135–145)
Sodium: 133 mmol/L — ABNORMAL LOW (ref 135–145)

## 2022-07-29 LAB — COOXEMETRY PANEL
Carboxyhemoglobin: 2.2 % — ABNORMAL HIGH (ref 0.5–1.5)
Methemoglobin: 0.7 % (ref 0.0–1.5)
O2 Saturation: 64.3 %
Total hemoglobin: 10 g/dL — ABNORMAL LOW (ref 12.0–16.0)

## 2022-07-29 LAB — MAGNESIUM: Magnesium: 2.1 mg/dL (ref 1.7–2.4)

## 2022-07-29 MED ORDER — POTASSIUM CHLORIDE 10 MEQ/50ML IV SOLN
10.0000 meq | INTRAVENOUS | Status: AC
  Administered 2022-07-29 – 2022-07-30 (×5): 10 meq via INTRAVENOUS
  Filled 2022-07-29 (×4): qty 50

## 2022-07-29 MED ORDER — POTASSIUM CHLORIDE 10 MEQ/50ML IV SOLN
INTRAVENOUS | Status: AC
  Administered 2022-07-29: 10 meq via INTRAVENOUS
  Filled 2022-07-29: qty 50

## 2022-07-29 MED ORDER — POTASSIUM CHLORIDE 10 MEQ/50ML IV SOLN
10.0000 meq | INTRAVENOUS | Status: DC
  Administered 2022-07-29: 10 meq via INTRAVENOUS
  Filled 2022-07-29 (×2): qty 50

## 2022-07-29 MED ORDER — TORSEMIDE 20 MG PO TABS
40.0000 mg | ORAL_TABLET | Freq: Every day | ORAL | Status: DC
  Administered 2022-07-30 – 2022-07-31 (×2): 40 mg via ORAL
  Filled 2022-07-29 (×2): qty 2

## 2022-07-29 MED ORDER — POTASSIUM CHLORIDE 10 MEQ/50ML IV SOLN
10.0000 meq | INTRAVENOUS | Status: AC
  Administered 2022-07-29 (×2): 10 meq via INTRAVENOUS

## 2022-07-29 MED ORDER — MIDODRINE HCL 5 MG PO TABS
15.0000 mg | ORAL_TABLET | Freq: Three times a day (TID) | ORAL | Status: DC
  Administered 2022-07-29 (×2): 15 mg via ORAL
  Filled 2022-07-29 (×2): qty 3

## 2022-07-29 MED ORDER — SORBITOL 70 % SOLN
30.0000 mL | Freq: Once | Status: AC
  Administered 2022-07-29: 30 mL via ORAL
  Filled 2022-07-29: qty 30

## 2022-07-29 MED ORDER — SERTRALINE HCL 25 MG PO TABS
25.0000 mg | ORAL_TABLET | Freq: Every day | ORAL | Status: DC
  Administered 2022-07-29 – 2022-08-13 (×16): 25 mg via ORAL
  Filled 2022-07-29 (×17): qty 1

## 2022-07-29 MED ORDER — POTASSIUM CHLORIDE 10 MEQ/100ML IV SOLN
10.0000 meq | INTRAVENOUS | Status: DC
  Administered 2022-07-29: 10 meq via INTRAVENOUS
  Filled 2022-07-29: qty 100

## 2022-07-29 MED ORDER — POTASSIUM CHLORIDE CRYS ER 20 MEQ PO TBCR
40.0000 meq | EXTENDED_RELEASE_TABLET | Freq: Once | ORAL | Status: AC
  Administered 2022-07-29: 40 meq via ORAL
  Filled 2022-07-29: qty 2

## 2022-07-29 MED ORDER — METOCLOPRAMIDE HCL 5 MG/ML IJ SOLN
10.0000 mg | Freq: Three times a day (TID) | INTRAMUSCULAR | Status: DC
  Administered 2022-07-29 – 2022-08-13 (×43): 10 mg via INTRAVENOUS
  Filled 2022-07-29 (×44): qty 2

## 2022-07-29 NOTE — Progress Notes (Signed)
CT surgery PM rounds  Patient progressing. Maintaining sinus rhythm, walked in hallway today but did not have a BM. No p.m. labs today Resting comfortably in bed  Blood pressure 130/63, pulse (!) 50, temperature (!) 96.8 F (36 C), temperature source Axillary, resp. rate 14, height '5\' 7"'$  (1.702 m), weight 69.8 kg, SpO2 95 %.

## 2022-07-29 NOTE — Progress Notes (Signed)
[address]       [place] [address]             [phone]      6 Days Post-Op  Procedure(s) (LRB): TRANSESOPHAGEAL ECHOCARDIOGRAM (TEE) (N/A) CARDIOVERSION (N/A)   Total Length of Stay:  LOS: 21 days    SUBJECTIVE: Has converted to NSR yesterday Large diuresis yesterday He has not had back pain yesterday Feels depressed "too many wires" Vitals:   07/29/22 0600 07/29/22 0700  BP: 95/63 (!) 116/33  Pulse: (!) 54 (!) 52  Resp: 19 17  Temp:    SpO2: 97% 97%    Intake/Output      10/17 0701 10/18 0700 10/18 0701 10/19 0700   P.O. 720    I.V. (mL/kg) 54 (0.8)    IV Piggyback 203    Total Intake(mL/kg) 977 (14)    Urine (mL/kg/hr) 7525 (4.5)    Total Output 7525    Net -6548.1             sodium chloride Stopped (07/22/22 1203)   cefTRIAXone (ROCEPHIN)  IV Stopped (07/28/22 1404)   norepinephrine (LEVOPHED) Adult infusion Stopped (07/28/22 0732)   potassium chloride 10 mEq (07/29/22 0759)   promethazine (PHENERGAN) injection (IM or IVPB)     sodium chloride      CBC    Component Value Date/Time   WBC 6.3 07/28/2022 0422   RBC 2.73 (L) 07/28/2022 0422   HGB 8.9 (L) 07/28/2022 0422   HGB 14.8 09/30/2018 1524   HCT 26.9 (L) 07/28/2022 0422   HCT 42.2 09/30/2018 1524   PLT 394 07/28/2022 0422   PLT 231 09/30/2018 1524   MCV 98.5 07/28/2022 0422   MCV 101 (H) 09/30/2018 1524   MCH 32.6 07/28/2022 0422   MCHC 33.1 07/28/2022 0422   RDW 15.0 07/28/2022 0422   RDW 12.8 09/30/2018 1524   LYMPHSABS 0.6 (L) 07/28/2022 0422   MONOABS 0.6 07/28/2022 0422   EOSABS 0.1 07/28/2022 0422   BASOSABS 0.0 07/28/2022 0422   CMP     Component Value Date/Time   NA 134 (L) 07/29/2022 0351   NA 141 10/11/2019 1521   K 2.6 (LL) 07/29/2022 0351   CL 82 (L) 07/29/2022 0351   CO2 37 (H) 07/29/2022 0351   GLUCOSE 115 (H) 07/29/2022 0351   BUN 22 07/29/2022 0351   BUN 13 10/11/2019 1521   CREATININE 1.17 07/29/2022 0351   CALCIUM 9.5  07/29/2022 0351   PROT 5.5 (L) 07/15/2022 1604   ALBUMIN 2.5 (L) 07/15/2022 1604   AST 32 07/15/2022 1604   ALT 25 07/15/2022 1604   ALKPHOS 48 07/15/2022 1604   BILITOT 1.0 07/15/2022 1604   GFRNONAA >60 07/29/2022 0351   GFRAA 108 10/11/2019 1521   ABG    Component Value Date/Time   PHART 7.342 (L) 07/10/2022 0546   PCO2ART 43.3 07/10/2022 0546   PO2ART 71 (L) 07/10/2022 0546   HCO3 23.4 07/10/2022 0546   TCO2 30 07/16/2022 0711   ACIDBASEDEF 2.0 07/10/2022 0546   O2SAT 64.3 07/29/2022 0400   CBG (last 3)  No results for input(s): "GLUCAP" in the last 72 hours. EXAM Lungs: decreased at the bases Card: RR  Ext: minimal edema and warm   ASSESSMENT: SP Bental Post op 3 weeks Has improved past 24 hrs Need to replace K Needs bowel movement Ambulate Discuss with AHF team timing of transfer to floor   Coralie Common, MD '@DATE'$ @

## 2022-07-29 NOTE — Progress Notes (Signed)
Cards fellow Joe [name] text-paged for K 2.6, requested replacement. Awaiting further orders.  UOP ~4L overnight. CVP 6-9. SB w/BBB. Mag 2.1. Crt 1.17.

## 2022-07-29 NOTE — Progress Notes (Signed)
Physical Therapy Treatment Patient Details Name: Joe [name] MRN: [id number] DOB: 22-Sep-1956 Today's Date: 07/29/2022   History of Present Illness 66 yo male admitted 9/27 for ascending aortic arch replacement with Bentall procedure and CABGx1 same date. Extubated 9/28. Ileus, Afib and CHF post procedure. PMHx: congenital brain damage, speech delay, bil THA, PLIF, Lt TSA, PVC    PT Comments    Pt had just returned to room after walking in hallway with nursing. Session focused on LE exercises and education. Pt in recliner with feet elevated at end of session.    Recommendations for follow up therapy are one component of a multi-disciplinary discharge planning process, led by the attending physician.  Recommendations may be updated based on patient status, additional functional criteria and insurance authorization.  Follow Up Recommendations  Home health PT     Assistance Recommended at Discharge Frequent or constant Supervision/Assistance  Patient can return home with the following A little help with walking and/or transfers;A little help with bathing/dressing/bathroom;Help with stairs or ramp for entrance;Assist for transportation;Assistance with feeding   Equipment Recommendations  None recommended by PT    Recommendations for Other Services       Precautions / Restrictions Precautions Precautions: Sternal;Fall Restrictions Weight Bearing Restrictions: Yes (sternal precautions)     Mobility  Bed Mobility                    Transfers Overall transfer level: Needs assistance Equipment used: Ambulation equipment used Transfers: Sit to/from Stand Sit to Stand: Min guard                Ambulation/Gait                   Stairs             Wheelchair Mobility    Modified Rankin (Stroke Patients Only)       Balance                                            Cognition Arousal/Alertness: Awake/alert Behavior  During Therapy: Flat affect Overall Cognitive Status: History of cognitive impairments - at baseline                                          Exercises General Exercises - Lower Extremity Ankle Circles/Pumps: AROM, Both, 20 reps Gluteal Sets: AROM, Both, 10 reps Heel Slides: AROM, Right, Left, 10 reps Hip ABduction/ADduction: AAROM, Right, Left, 10 reps Straight Leg Raises: AAROM, Right, Left, 10 reps    General Comments        Pertinent Vitals/Pain Pain Assessment Pain Assessment: Faces Faces Pain Scale: Hurts little more Pain Location: back Pain Descriptors / Indicators: Discomfort, Sore Pain Intervention(s): Repositioned, Limited activity within patient's tolerance    Home Living                          Prior Function            PT Goals (current goals can now be found in the care plan section) Acute Rehab PT Goals Patient Stated Goal: home Progress towards PT goals: Progressing toward goals    Frequency    Min 3X/week      PT Plan  Current plan remains appropriate    Co-evaluation              AM-PAC PT "6 Clicks" Mobility   Outcome Measure  Help needed turning from your back to your side while in a flat bed without using bedrails?: A Little Help needed moving from lying on your back to sitting on the side of a flat bed without using bedrails?: A Little Help needed moving to and from a bed to a chair (including a wheelchair)?: A Little Help needed standing up from a chair using your arms (e.g., wheelchair or bedside chair)?: A Little Help needed to walk in hospital room?: A Little Help needed climbing 3-5 steps with a railing? : A Lot 6 Click Score: 17    End of Session Equipment Utilized During Treatment: Oxygen (3.5L) Activity Tolerance: Patient tolerated treatment well Patient left: in chair;with call bell/phone within reach;with family/visitor present Nurse Communication: Mobility status PT Visit Diagnosis:  Other abnormalities of gait and mobility (R26.89);Muscle weakness (generalized) (M62.81);Unsteadiness on feet (R26.81)     Time: 1898-4210 PT Time Calculation (min) (ACUTE ONLY): 18 min  Charges:  $Therapeutic Exercise: 8-22 mins                     Lorrin Goodell, PT  Office # [phone] Pager [phone]    Lorriane Shire 07/29/2022, 10:51 AM

## 2022-07-29 NOTE — Progress Notes (Addendum)
Patient ID: Joe [name], male   DOB: 1956-07-10, 67 y.o.   MRN: [id number]     Advanced Heart Failure Rounding Note  PCP-Cardiologist: Skeet Latch, MD   Subjective:    9/27 s/p Bentall with bioprosthetic aortic valve . Post-op course c/b ileus.   S/p DCCV 10/12--> NSR/SB  10/16 developed back pain and hypotension. Stat CT with no dissection. + evidence of urinary retention. Transferred back to CCU, started on NE. Foley paced with ~1.5L out.  UA positive, started on rocephin. Back in Afib, placed back on amio gtt.   10/17 PICC replaced given RUE PICC associated DVT. Torsemide resumed. Given dose of metolazone. Bowel regimen advanced.  Brisk diuresis yesterday, 7.5L in UOP. Scr stable 1.17. CVP 8. Co-ox 64%  K low 2.6  Mg 2.1   Now off NE. On Midodrine 10 tid. SBPs low 100s.   Back in sinus this morning, SB upper 50s.   No BM yet. Passing flatus. Tolerating PO intake. Able to eat breakfast w/o nausea.     Objective:   Weight Range: 69.8 kg Body mass index is 24.1 kg/m.   Vital Signs:   Temp:  [97.5 F (36.4 C)-98.4 F (36.9 C)] 98.4 F (36.9 C) (10/18 0809) Pulse Rate:  [50-184] 55 (10/18 0707) Resp:  [9-22] 15 (10/18 0707) BP: (81-142)/(33-95) 103/60 (10/18 0707) SpO2:  [91 %-100 %] 98 % (10/18 0707) Weight:  [69.8 kg] 69.8 kg (10/18 0500) Last BM Date : 07/25/22  Weight change: Filed Weights   07/26/22 0441 07/28/22 0500 07/29/22 0500  Weight: 75 kg 71.5 kg 69.8 kg    Intake/Output:   Intake/Output Summary (Last 24 hours) at 07/29/2022 0817 Last data filed at 07/29/2022 0700 Gross per 24 hour  Intake 958.24 ml  Output 7525 ml  Net -6566.76 ml      Physical Exam   CVP 8  General:  Well appearing. Sitting up in bed. NAD HEENT: normal Neck: supple. JVD 7 cm. Carotids 2+ bilat; no bruits. No lymphadenopathy or thyromegaly appreciated. Cor: PMI nondisplaced. RRR. No rubs, gallops or murmurs. +sternotomy site ok.  Lungs: clear Abdomen: soft,  nontender, mildly distended. No hepatosplenomegaly. No bruits or masses. Active bowel sounds. Extremities: no cyanosis, clubbing, rash, trace b/l pretibial edema + LUE PICC  Neuro: alert & oriented x 3, cranial nerves grossly intact. moves all 4 extremities w/o difficulty. Affect pleasant. GU: + foley    Telemetry   Sinus brady 56 bpm personally reviewed   Labs    CBC Recent Labs    07/27/22 0520 07/28/22 0422  WBC 8.7 6.3  NEUTROABS 7.0 5.0  HGB 9.8* 8.9*  HCT 29.2* 26.9*  MCV 96.7 98.5  PLT 431* 322   Basic Metabolic Panel Recent Labs    07/28/22 0422 07/29/22 0351  NA 134* 134*  K 4.2 2.6*  CL 91* 82*  CO2 31 37*  GLUCOSE 106* 115*  BUN 17 22  CREATININE 1.01 1.17  CALCIUM 8.9 9.5  MG 2.2 2.1   Liver Function Tests No results for input(s): "AST", "ALT", "ALKPHOS", "BILITOT", "PROT", "ALBUMIN" in the last 72 hours.  BNP: BNP (last 3 results) Recent Labs    06/25/22 1829  BNP 436.2*     Imaging  reviewed  Medications:     Scheduled Medications:  amiodarone  400 mg Oral BID   apixaban  5 mg Oral BID   aspirin EC  81 mg Oral Daily   atorvastatin  80 mg Oral Daily   Chlorhexidine  Gluconate Cloth  6 each Topical Daily   midodrine  10 mg Oral TID WC   pantoprazole  40 mg Oral Daily   polyethylene glycol  17 g Oral BID   potassium chloride  40 mEq Oral BID   senna-docusate  2 tablet Oral BID   sodium chloride flush  10-40 mL Intracatheter Q12H   sodium chloride flush  3 mL Intravenous Q12H   sorbitol  30 mL Oral Once   tamsulosin  0.4 mg Oral BID   torsemide  40 mg Oral BID    Infusions:  sodium chloride Stopped (07/22/22 1203)   cefTRIAXone (ROCEPHIN)  IV Stopped (07/28/22 1404)   norepinephrine (LEVOPHED) Adult infusion Stopped (07/28/22 0732)   potassium chloride 10 mEq (07/29/22 0759)   promethazine (PHENERGAN) injection (IM or IVPB)     sodium chloride      PRN Medications: sodium chloride, acetaminophen, bisacodyl, diazepam,  hydrocortisone, hydrocortisone, melatonin, ondansetron (ZOFRAN) IV, mouth rinse, promethazine (PHENERGAN) injection (IM or IVPB), sodium chloride flush, sodium chloride flush, traMADol    Patient Profile  66 y/o male recently found to have severe LV dysfunction in setting of markedly dilated aortic root (67 mm) and severe AI (tricuspid AoV) and single vessel CAD, now s/p Bentall and CABG x 1 w/ SVG-RCA. AHF team consulted to assist w/ post-operative management of systolic heart failure.     Assessment/Plan   Ascending Aortic Aneurysm/ Severe AI  - CTA  w/ severe dilation at the sinus of Valsalva, 67 mm. Moderate dilation of mid ascending aorta, 44 mm. Tricuspid AoV - s/p Bentall with bioprosthetic aortic valve 9/27 - intraoperative TEE, no perivalvular leak post repair  - Echo 10/10 EF 25% RV function improving. Moderate to severe MR (in setting of LBBB) AVR ok   - CTA 10/16 negative for dissection.   2. CAD - single vessel, 60-70% pRCA  - s/p CABG x 1, SVG- RCA  - ASA + statin  - No BB for now.     3. Chronic Systolic Heart Failure  - newly diagnosed; Echo 9/23 EF 25-30%, LV severely dilated, severe AI, Global HK, RV ok  - NICM, severity out of proportion to degree of CAD  - likely valvular CM in setting of severe AI +/- LBBB CM  - now post op bioprosthetic AVR + CABG. Intraoperative TEE LVEF 20-25%, RV ok - With junctional rhythm, will hold off on beta blocker and digoxin for now to avoid worsening A-V conduction. Eventual CRT-D (see below).  - Hypotensive 10/04 in setting of urinary retention.  - Echo 10/9- EF 25% RV function improving. Moderate to severe MR (in setting of LBBB) AVR ok   - Co-ox 64%, CVP 8  - Continue Torsemide 40 mg bid and supp K  - GDMT limited due to hypotension.   - Continue midodrine 10 mg three times a day for BP support. - would avoid SGLT2i given urinary retention/foley   4. Junctional rhythm - Resolved  - Has epicardial LV lead. EP following. Had  been considering CRT-D prior to discharge but given need for DC-CV and prolonged hospitalization likely better wait at least several weeks until he recovers   5. LBBB -Will eventually need epicardial lead hooked up for CRT see discussion above   6. Anemia - Post-op, 1 u RBCs 10/04. - Hgb stable today.   7. Thrombocytopenia - Resolved.  8. Atrial fib/flutter, post-op - s/p DCCV 10/12>>NSR  - Back in A fib 10/16 - Back in NSR today  -  Continue PO amio 400 mg bid, taper prior to d/c  - Eliquis 5 mg bid  - Supp K   9. Urinary retention/UTI  - Followed by [facility] every 6 month and has been on flomax for some time.  - Recurrent bladder distention. CT abd--> 1600 cc urine.  Foley replaced  with 1300 cc out.  - Check UA +. Started on Rocephin  - Continues w/ foley.    10. Post-op ileus/ Constipation  - 10/9 KUB c/w ileus   - Last BM 10/13 - Ongoing bowel distention on CT.  - Active BS on exam, + flatus - D/w bowel regimen w/pharm D. Restart Reglan today, encouraged ambulation   11. Hypokalemia - in the setting of diuresis, 2.6 today. Mg ok 2.1 - getting IV K supp  - repeat BMP this afternoon   12. Hyponatremia - Na 134  13. Back Pain  - CTA- negative for dissection  - suspect 2/2 bladder distention   14. RUE Pain/ PICC Line Associated DVT  -US + for Acute nonocclusive DVT surrounding the picc line in the subclavian vein -PICC pulled  -Already on Eliquis for Afib   Length of Stay: [place], PA-C  8:17 AM  Patient seen and examined with the above-signed Advanced Practice Provider and/or Housestaff. I personally reviewed laboratory data, imaging studies and relevant notes. I independently examined the patient and formulated the important aspects of the plan. I have edited the note to reflect any of my changes or salient points. I have personally discussed the plan with the patient and/or family.  Had good diuresis with metolazone. Denies CP or  SOB. No longer in AF. Rhythm looks junctional on monitor. Still no BM. Feels depressed   General:  sitting in chair. No resp difficulty HEENT: normal Neck: supple. no JVD. Carotids 2+ bilat; no bruits. No lymphadenopathy or thryomegaly appreciated. Cor: PMI nondisplaced. Regular rate & rhythm. No rubs, gallops or murmurs. Lungs: clear Abdomen: soft, nontender, nondistended. No hepatosplenomegaly. No bruits or masses. Good bowel sounds. Extremities: no cyanosis, clubbing, rash, edema + Foley Neuro: alert & orientedx3, cranial nerves grossly intact. moves all 4 extremities w/o difficulty. Affect pleasant  Overall improved. Volume status looks good - likely a bit dry. Back in NSR (vs junctional). Still constipated.   Will hold diuretics. Increase bowel regimen. Start sertraline for situational depression. Ambulate. Likely to floor later today or tomorrow.   Glori Bickers, MD  3:55 PM

## 2022-07-30 DIAGNOSIS — I5022 Chronic systolic (congestive) heart failure: Secondary | ICD-10-CM | POA: Diagnosis not present

## 2022-07-30 DIAGNOSIS — Z95828 Presence of other vascular implants and grafts: Secondary | ICD-10-CM | POA: Diagnosis not present

## 2022-07-30 DIAGNOSIS — I719 Aortic aneurysm of unspecified site, without rupture: Secondary | ICD-10-CM | POA: Diagnosis not present

## 2022-07-30 LAB — MAGNESIUM: Magnesium: 1.7 mg/dL (ref 1.7–2.4)

## 2022-07-30 LAB — BASIC METABOLIC PANEL
Anion gap: 2 — ABNORMAL LOW (ref 5–15)
Anion gap: 15 (ref 5–15)
BUN: 20 mg/dL (ref 8–23)
BUN: 22 mg/dL (ref 8–23)
CO2: 37 mmol/L — ABNORMAL HIGH (ref 22–32)
CO2: 39 mmol/L — ABNORMAL HIGH (ref 22–32)
Calcium: 8.2 mg/dL — ABNORMAL LOW (ref 8.9–10.3)
Calcium: 9.1 mg/dL (ref 8.9–10.3)
Chloride: 86 mmol/L — ABNORMAL LOW (ref 98–111)
Chloride: 78 mmol/L — ABNORMAL LOW (ref 98–111)
Creatinine, Ser: 1.14 mg/dL (ref 0.61–1.24)
Creatinine, Ser: 1.3 mg/dL — ABNORMAL HIGH (ref 0.61–1.24)
GFR, Estimated: >60 mL/min (ref >60)
GFR, Estimated: 60 mL/min — ABNORMAL LOW (ref >60)
Glucose, Bld: 102 mg/dL — ABNORMAL HIGH (ref 70–99)
Glucose, Bld: 105 mg/dL — ABNORMAL HIGH (ref 70–99)
Potassium: >7.5 mmol/L (ref 3.5–5.1)
Potassium: 3.6 mmol/L (ref 3.5–5.1)
Sodium: 125 mmol/L — ABNORMAL LOW (ref 135–145)
Sodium: 132 mmol/L — ABNORMAL LOW (ref 135–145)

## 2022-07-30 LAB — COOXEMETRY PANEL
Carboxyhemoglobin: 2.2 % — ABNORMAL HIGH (ref 0.5–1.5)
Methemoglobin: <0.7 % (ref 0.0–1.5)
O2 Saturation: 71 %
Total hemoglobin: 10.4 g/dL — ABNORMAL LOW (ref 12.0–16.0)

## 2022-07-30 MED ORDER — SORBITOL 70 % SOLN
30.0000 mL | Freq: Once | Status: AC
  Administered 2022-07-30: 30 mL via ORAL
  Filled 2022-07-30: qty 30

## 2022-07-30 MED ORDER — MAGNESIUM SULFATE 4 GM/100ML IV SOLN
4.0000 g | Freq: Once | INTRAVENOUS | Status: AC
  Administered 2022-07-30: 4 g via INTRAVENOUS
  Filled 2022-07-30: qty 100

## 2022-07-30 MED ORDER — BISACODYL 10 MG RE SUPP
10.0000 mg | Freq: Once | RECTAL | Status: AC
  Administered 2022-07-30: 10 mg via RECTAL
  Filled 2022-07-30: qty 1

## 2022-07-30 MED ORDER — MIDODRINE HCL 5 MG PO TABS
5.0000 mg | ORAL_TABLET | Freq: Three times a day (TID) | ORAL | Status: DC
  Administered 2022-07-30: 5 mg via ORAL
  Filled 2022-07-30: qty 1

## 2022-07-30 NOTE — Progress Notes (Signed)
      [address]       [place] [address]             [phone]      7 Days Post-Op  Procedure(s) (LRB): TRANSESOPHAGEAL ECHOCARDIOGRAM (TEE) (N/A) CARDIOVERSION (N/A)   Total Length of Stay:  LOS: 22 days    SUBJECTIVE: Still complaining of buttock discomfort No stool Maintaining NSR Vitals:   07/30/22 0500 07/30/22 0600  BP: 137/69 (!) 150/70  Pulse: (!) 54 (!) 56  Resp: 17 20  Temp:    SpO2: 92% 93%    Intake/Output      10/18 0701 10/19 0700 10/19 0701 10/20 0700   P.O.     I.V. (mL/kg)     IV Piggyback 427.1    Total Intake(mL/kg) 427.1 (6.4)    Urine (mL/kg/hr) 3850 (2.4)    Stool 0    Total Output 3850    Net -3422.9         Stool Occurrence 0 x        sodium chloride Stopped (07/22/22 1203)   cefTRIAXone (ROCEPHIN)  IV Stopped (07/29/22 1618)   promethazine (PHENERGAN) injection (IM or IVPB)     sodium chloride      CBC    Component Value Date/Time   WBC 6.3 07/28/2022 0422   RBC 2.73 (L) 07/28/2022 0422   HGB 8.9 (L) 07/28/2022 0422   HGB 14.8 09/30/2018 1524   HCT 26.9 (L) 07/28/2022 0422   HCT 42.2 09/30/2018 1524   PLT 394 07/28/2022 0422   PLT 231 09/30/2018 1524   MCV 98.5 07/28/2022 0422   MCV 101 (H) 09/30/2018 1524   MCH 32.6 07/28/2022 0422   MCHC 33.1 07/28/2022 0422   RDW 15.0 07/28/2022 0422   RDW 12.8 09/30/2018 1524   LYMPHSABS 0.6 (L) 07/28/2022 0422   MONOABS 0.6 07/28/2022 0422   EOSABS 0.1 07/28/2022 0422   BASOSABS 0.0 07/28/2022 0422   CMP     Component Value Date/Time   NA 132 (L) 07/30/2022 0456   NA 141 10/11/2019 1521   K 3.6 07/30/2022 0456   CL 78 (L) 07/30/2022 0456   CO2 39 (H) 07/30/2022 0456   GLUCOSE 105 (H) 07/30/2022 0456   BUN 22 07/30/2022 0456   BUN 13 10/11/2019 1521   CREATININE 1.30 (H) 07/30/2022 0456   CALCIUM 9.1 07/30/2022 0456   PROT 5.5 (L) 07/15/2022 1604   ALBUMIN 2.5 (L) 07/15/2022 1604   AST 32 07/15/2022 1604   ALT 25 07/15/2022 1604   ALKPHOS 48  07/15/2022 1604   BILITOT 1.0 07/15/2022 1604   GFRNONAA 60 (L) 07/30/2022 0456   GFRAA 108 10/11/2019 1521   ABG    Component Value Date/Time   PHART 7.342 (L) 07/10/2022 0546   PCO2ART 43.3 07/10/2022 0546   PO2ART 71 (L) 07/10/2022 0546   HCO3 23.4 07/10/2022 0546   TCO2 30 07/16/2022 0711   ACIDBASEDEF 2.0 07/10/2022 0546   O2SAT 71 07/30/2022 0336   CBG (last 3)  No results for input(s): "GLUCAP" in the last 72 hours.  EXAM Lungs: decreased at bases Card: rr with no murmur Ext; no edema  warm  ASSESSMENT: SP Bental BP stable.  Cr elevated and now on just daily diuretic Suppository for BM Ok to go to floor   Coralie Common, MD '@DATE'$ @

## 2022-07-30 NOTE — Progress Notes (Addendum)
Patient ID: Joe [name], male   DOB: 1956/03/26, 66 y.o.   MRN: [id number]     Advanced Heart Failure Rounding Note  PCP-Cardiologist: Skeet Latch, MD   Subjective:    9/27 s/p Bentall with bioprosthetic aortic valve . Post-op course c/b ileus.   S/p DCCV 10/12--> NSR/SB  10/16 developed back pain and hypotension. Stat CT with no dissection. + evidence of urinary retention. Transferred back to CCU, started on NE. Foley paced with ~1.5L out.  UA positive, started on rocephin. Back in Afib, placed back on amio gtt.   10/17 PICC replaced given RUE PICC associated DVT. Torsemide resumed. Given dose of metolazone. Bowel regimen advanced.  Co-ox 71%, CVP 7. BPs now 140s on midodrine   3.9L in UOP yesterday. Wt down 6 lb. SCr 1.17>>1.3 K 3.6 Mg 1.7   Continues sinus brady, upper 50s   No BM yet. Passing flatus. Tolerating PO intake. No n/v. Complaining of buttock pain.     Objective:   Weight Range: 66.8 kg Body mass index is 23.07 kg/m.   Vital Signs:   Temp:  [96.8 F (36 C)-98.2 F (36.8 C)] 98.2 F (36.8 C) (10/18 2000) Pulse Rate:  [50-61] 56 (10/19 0600) Resp:  [11-21] 20 (10/19 0600) BP: (111-150)/(57-110) 150/70 (10/19 0600) SpO2:  [92 %-99 %] 93 % (10/19 0600) Weight:  [66.8 kg] 66.8 kg (10/19 0600) Last BM Date : 07/25/22  Weight change: Filed Weights   07/28/22 0500 07/29/22 0500 07/30/22 0600  Weight: 71.5 kg 69.8 kg 66.8 kg    Intake/Output:   Intake/Output Summary (Last 24 hours) at 07/30/2022 0827 Last data filed at 07/30/2022 0500 Gross per 24 hour  Intake 381.17 ml  Output 3650 ml  Net -3268.83 ml      Physical Exam   CVP 7  General:  Well appearing, sitting up in chair No respiratory difficulty HEENT: normal Neck: supple. JVD 10. Carotids 2+ bilat; no bruits. No lymphadenopathy or thyromegaly appreciated. Cor: PMI nondisplaced. Regular rhythm, slow rate. No rubs, gallops or murmurs. Lungs: clear Abdomen: soft, nontender, mildly  distended. No hepatosplenomegaly. No bruits or masses. Good bowel sounds. Extremities: no cyanosis, clubbing, rash, edema + TED hoses, + LUE PICC  Neuro: alert & oriented x 3, cranial nerves grossly intact. moves all 4 extremities w/o difficulty. Affect pleasant.   Telemetry   Sinus brady 56 bpm personally reviewed   Labs    CBC Recent Labs    07/28/22 0422  WBC 6.3  NEUTROABS 5.0  HGB 8.9*  HCT 26.9*  MCV 98.5  PLT 355   Basic Metabolic Panel Recent Labs    07/29/22 0351 07/29/22 2008 07/30/22 0336 07/30/22 0456  NA 134*   < > 125* 132*  K 2.6*   < > >7.5* 3.6  CL 82*   < > 86* 78*  CO2 37*   < > 37* 39*  GLUCOSE 115*   < > 102* 105*  BUN 22   < > 20 22  CREATININE 1.17   < > 1.14 1.30*  CALCIUM 9.5   < > 8.2* 9.1  MG 2.1  --  1.7  --    < > = values in this interval not displayed.   Liver Function Tests No results for input(s): "AST", "ALT", "ALKPHOS", "BILITOT", "PROT", "ALBUMIN" in the last 72 hours.  BNP: BNP (last 3 results) Recent Labs    06/25/22 1829  BNP 436.2*     Imaging  reviewed  Medications:  Scheduled Medications:  amiodarone  400 mg Oral BID   apixaban  5 mg Oral BID   aspirin EC  81 mg Oral Daily   atorvastatin  80 mg Oral Daily   bisacodyl  10 mg Rectal Once   Chlorhexidine Gluconate Cloth  6 each Topical Daily   metoCLOPramide (REGLAN) injection  10 mg Intravenous Q8H   midodrine  15 mg Oral TID WC   pantoprazole  40 mg Oral Daily   polyethylene glycol  17 g Oral BID   potassium chloride  40 mEq Oral BID   senna-docusate  2 tablet Oral BID   sertraline  25 mg Oral Daily   sodium chloride flush  10-40 mL Intracatheter Q12H   sodium chloride flush  3 mL Intravenous Q12H   tamsulosin  0.4 mg Oral BID   torsemide  40 mg Oral Daily    Infusions:  sodium chloride Stopped (07/22/22 1203)   cefTRIAXone (ROCEPHIN)  IV Stopped (07/29/22 1618)   promethazine (PHENERGAN) injection (IM or IVPB)     sodium chloride      PRN  Medications: sodium chloride, acetaminophen, bisacodyl, diazepam, hydrocortisone, hydrocortisone, melatonin, ondansetron (ZOFRAN) IV, mouth rinse, promethazine (PHENERGAN) injection (IM or IVPB), sodium chloride flush, sodium chloride flush, traMADol    Patient Profile  66 y/o male recently found to have severe LV dysfunction in setting of markedly dilated aortic root (67 mm) and severe AI (tricuspid AoV) and single vessel CAD, now s/p Bentall and CABG x 1 w/ SVG-RCA. AHF team consulted to assist w/ post-operative management of systolic heart failure.     Assessment/Plan   Ascending Aortic Aneurysm/ Severe AI  - CTA  w/ severe dilation at the sinus of Valsalva, 67 mm. Moderate dilation of mid ascending aorta, 44 mm. Tricuspid AoV - s/p Bentall with bioprosthetic aortic valve 9/27 - intraoperative TEE, no perivalvular leak post repair  - Echo 10/10 EF 25% RV function improving. Moderate to severe MR (in setting of LBBB) AVR ok   - CTA 10/16 negative for dissection.   2. CAD - single vessel, 60-70% pRCA  - s/p CABG x 1, SVG- RCA  - ASA + statin  - No BB for now w/ bradycardia     3. Chronic Systolic Heart Failure  - newly diagnosed; Echo 9/23 EF 25-30%, LV severely dilated, severe AI, Global HK, RV ok  - NICM, severity out of proportion to degree of CAD  - likely valvular CM in setting of severe AI +/- LBBB CM  - now post op bioprosthetic AVR + CABG. Intraoperative TEE LVEF 20-25%, RV ok - With junctional rhythm, will hold off on beta blocker and digoxin for now to avoid worsening A-V conduction. Eventual CRT-D (see below).  - Hypotensive 10/04 in setting of urinary retention.  - Echo 10/9- EF 25% RV function improving. Moderate to severe MR (in setting of LBBB) AVR ok   - Co-ox 71%, CVP 7  - Reduce Torsemide to 40 mg once daily  - GDMT limited due to hypotension. BPs now running high w/ midodrine. Will start midodrine wean, reduce to 5 mg tid.  - would avoid SGLT2i given urinary  retention/foley   4. Junctional rhythm - Resolved  - Has epicardial LV lead. EP following. Had been considering CRT-D prior to discharge but given need for DC-CV and prolonged hospitalization likely better wait at least several weeks until he recovers   5. LBBB -Will eventually need epicardial lead hooked up for CRT see discussion above  6. Anemia - Post-op, 1 u RBCs 10/04. - Hgb stable today.   7. Thrombocytopenia - Resolved.  8. Atrial fib/flutter, post-op - s/p DCCV 10/12>>NSR  - Back in A fib 10/16 - Back in NSR today  - Continue PO amio 400 mg bid, taper prior to d/c  - Eliquis 5 mg bid   9. Urinary retention/UTI  - Followed by [facility] every 6 month and has been on flomax for some time.  - Recurrent bladder distention. CT abd--> 1600 cc urine.  Foley replaced  with 1300 cc out.  - Check UA +. Started on Rocephin  - Continues w/ foley.    10. Post-op ileus/ Constipation  - 10/9 KUB c/w ileus   - Last BM 10/13 - Ongoing bowel distention on CT.  - Active BS on exam, + flatus - Continue bowel regimen, given another dose Reglan today, encouraged ambulation   11. Hypokalemia/Hypomagnesemia  - K 3.6, Mg 1.7  - supp lytes   12. Hyponatremia - Na 132  13. Back Pain  - CTA- negative for dissection  - suspect 2/2 bladder distention   14. RUE Pain/ PICC Line Associated DVT  -US + for Acute nonocclusive DVT surrounding the picc line in the subclavian vein -PICC pulled  -Already on Eliquis for Afib  15. Situational Depression  - started sertraline 10/18    Length of Stay: [address] PA-C  8:27 AM  Patient seen and examined with the above-signed Advanced Practice Provider and/or Housestaff. I personally reviewed laboratory data, imaging studies and relevant notes. I independently examined the patient and formulated the important aspects of the plan. I have edited the note to reflect any of my changes or salient points. I have personally  discussed the plan with the patient and/or family.  Continues to feel weak and have some back pain (which appears muscular). Struggling with ileus. Back in NSR. Remains on Eliquis. Small BM this am   General:  Weak appearing. No resp difficulty HEENT: normal Neck: supple. no JVD. Carotids 2+ bilat; no bruits. No lymphadenopathy or thryomegaly appreciated. Cor: PMI nondisplaced. Regular rate & rhythm. No rubs, gallops or murmurs. Lungs: clear Abdomen: soft, nontender, nondistended. No hepatosplenomegaly. No bruits or masses. Good bowel sounds. Extremities: no cyanosis, clubbing, rash, edema + Foley  Neuro: alert & orientedx3, cranial nerves grossly intact. moves all 4 extremities w/o difficulty. Affect flat   Back in NSR. Volume status ok. Still struggling with ileus. Will give srobitol today.  Agree with transfer to 4E. I remain concerned about how weak he has gotten and wonder if he will need SNF though his mother would like to take him home when he is ready. Continue sertraline for situational depression. Leave Foley in for now. Will need voiding trial as he progresses.   Glori Bickers, MD  1:28 PM

## 2022-07-31 DIAGNOSIS — Z95828 Presence of other vascular implants and grafts: Secondary | ICD-10-CM | POA: Diagnosis not present

## 2022-07-31 LAB — BASIC METABOLIC PANEL
Anion gap: 15 (ref 5–15)
Anion gap: 14 (ref 5–15)
BUN: 24 mg/dL — ABNORMAL HIGH (ref 8–23)
BUN: 27 mg/dL — ABNORMAL HIGH (ref 8–23)
CO2: 41 mmol/L — ABNORMAL HIGH (ref 22–32)
CO2: 42 mmol/L — ABNORMAL HIGH (ref 22–32)
Calcium: 9 mg/dL (ref 8.9–10.3)
Calcium: 9 mg/dL (ref 8.9–10.3)
Chloride: 78 mmol/L — ABNORMAL LOW (ref 98–111)
Chloride: 76 mmol/L — ABNORMAL LOW (ref 98–111)
Creatinine, Ser: 1.13 mg/dL (ref 0.61–1.24)
Creatinine, Ser: 1.18 mg/dL (ref 0.61–1.24)
GFR, Estimated: >60 mL/min (ref >60)
GFR, Estimated: >60 mL/min (ref >60)
Glucose, Bld: 107 mg/dL — ABNORMAL HIGH (ref 70–99)
Glucose, Bld: 142 mg/dL — ABNORMAL HIGH (ref 70–99)
Potassium: 2.7 mmol/L — CL (ref 3.5–5.1)
Potassium: 3 mmol/L — ABNORMAL LOW (ref 3.5–5.1)
Sodium: 134 mmol/L — ABNORMAL LOW (ref 135–145)
Sodium: 132 mmol/L — ABNORMAL LOW (ref 135–145)

## 2022-07-31 LAB — COOXEMETRY PANEL
Carboxyhemoglobin: <0.3 % — ABNORMAL LOW (ref 0.5–1.5)
Methemoglobin: 1.5 % (ref 0.0–1.5)
O2 Saturation: 65.2 %
Total hemoglobin: 10.2 g/dL — ABNORMAL LOW (ref 12.0–16.0)

## 2022-07-31 LAB — GLUCOSE, CAPILLARY: Glucose-Capillary: 102 mg/dL — ABNORMAL HIGH (ref 70–99)

## 2022-07-31 LAB — ECHO TEE
AV Mean grad: 13 mmHg
AV Peak grad: 26 mmHg
Ao pk vel: 2.55 m/s

## 2022-07-31 LAB — MAGNESIUM: Magnesium: 2.3 mg/dL (ref 1.7–2.4)

## 2022-07-31 MED ORDER — POTASSIUM CHLORIDE CRYS ER 20 MEQ PO TBCR
40.0000 meq | EXTENDED_RELEASE_TABLET | Freq: Once | ORAL | Status: AC
  Administered 2022-07-31: 40 meq via ORAL
  Filled 2022-07-31: qty 2

## 2022-07-31 MED ORDER — TORSEMIDE 20 MG PO TABS
20.0000 mg | ORAL_TABLET | Freq: Every day | ORAL | Status: DC
  Administered 2022-08-01 – 2022-08-02 (×2): 20 mg via ORAL
  Filled 2022-07-31 (×2): qty 1

## 2022-07-31 MED ORDER — SPIRONOLACTONE 12.5 MG HALF TABLET
12.5000 mg | ORAL_TABLET | Freq: Every day | ORAL | Status: DC
  Administered 2022-07-31 – 2022-08-13 (×14): 12.5 mg via ORAL
  Filled 2022-07-31 (×14): qty 1

## 2022-07-31 MED ORDER — SENNOSIDES-DOCUSATE SODIUM 8.6-50 MG PO TABS: 2.0000 | ORAL_TABLET | Freq: Two times a day (BID) | ORAL | Status: DC | PRN

## 2022-07-31 MED ORDER — AMIODARONE HCL 200 MG PO TABS
200.0000 mg | ORAL_TABLET | Freq: Two times a day (BID) | ORAL | Status: DC
  Administered 2022-07-31 – 2022-08-03 (×6): 200 mg via ORAL
  Filled 2022-07-31 (×7): qty 1

## 2022-07-31 MED ORDER — POLYETHYLENE GLYCOL 3350 17 G PO PACK: 17.0000 g | PACK | Freq: Every day | ORAL | Status: DC | PRN

## 2022-07-31 MED ORDER — POTASSIUM CHLORIDE CRYS ER 20 MEQ PO TBCR
60.0000 meq | EXTENDED_RELEASE_TABLET | Freq: Once | ORAL | Status: AC
  Administered 2022-07-31: 60 meq via ORAL
  Filled 2022-07-31: qty 3

## 2022-07-31 NOTE — Progress Notes (Addendum)
      [address]       [place] [address]             [phone]      8 Days Post-Op Procedure(s) (LRB): TRANSESOPHAGEAL ECHOCARDIOGRAM (TEE) (N/A) CARDIOVERSION (N/A)  Subjective:  Patient feels nauseated this morning.  Continues to have discomfort along his bottom.  He was able to move his bowels yesterday, however diarrhea at times.  Objective: Vital signs in last 24 hours: Temp:  [97.6 F (36.4 C)-98.3 F (36.8 C)] 98.3 F (36.8 C) (10/20 0751) Pulse Rate:  [47-56] 50 (10/20 0751) Cardiac Rhythm: Heart block;Bundle branch block (10/19 1900) Resp:  [11-24] 14 (10/20 0751) BP: (102-161)/(54-96) 114/65 (10/20 0751) SpO2:  [86 %-98 %] 93 % (10/20 0751) Weight:  [59.8 kg] 59.8 kg (10/20 0600)  Hemodynamic parameters for last 24 hours: CVP:  [7 mmHg-10 mmHg] 7 mmHg  Intake/Output from previous day: 10/19 0701 - 10/20 0700 In: 185 [P.O.:120; IV Piggyback:65] Out: 2800 [Urine:2800]  General appearance: alert, cooperative, and no distress Heart: regular rate and rhythm Lungs: clear to auscultation bilaterally Abdomen: soft, non-tender; bowel sounds normal; no masses,  no organomegaly Extremities: edema trace Wound: surgical incisions healing without evidence on infection, there is a dime size sacral wound on the top portion of his buttocks  Lab Results: No results for input(s): "WBC", "HGB", "HCT", "PLT" in the last 72 hours. BMET:  Recent Labs    07/30/22 0456 07/31/22 0620  NA 132* 134*  K 3.6 2.7*  CL 78* 78*  CO2 39* 41*  GLUCOSE 105* 107*  BUN 22 24*  CREATININE 1.30* 1.13  CALCIUM 9.1 9.0    PT/INR: No results for input(s): "LABPROT", "INR" in the last 72 hours. ABG    Component Value Date/Time   PHART 7.342 (L) 07/10/2022 0546   HCO3 23.4 07/10/2022 0546   TCO2 30 07/16/2022 0711   ACIDBASEDEF 2.0 07/10/2022 0546   O2SAT 65.2 07/31/2022 0620   CBG (last 3)  Recent Labs    07/31/22 0628  GLUCAP 102*     Assessment/Plan: S/P Procedure(s) (LRB): TRANSESOPHAGEAL ECHOCARDIOGRAM (TEE) (N/A) CARDIOVERSION (N/A)  CV- Chronic systolic HF S/P Bentall, CABG x 1, severe MR on Echocardiogram, BP remains labile-- weaning Midodrine as tolerated, Amiodarone BID... AHF is following Pulm- off oxygen, small bilateral effusions Renal- creatinine improved at 1.13, diuresing well on Demadex Hypokalemia- at 2.7 due aggressive diuretics, will increase supplement as ordered by Dr. Lavonna Monarch 80 mg today GI- constipation resolved, now with diarrhea.. patient with a large amount of stool agents yesterday... no immodium as he will likely get constipated...have changed stool agents to prn regimen Sacral Wound- small dime size sacral wound.. wound care consult requested Deconditioning- plan was for patient to go home with H/H some concern if his mother can provide necessary care, she is willing to hire some extra help.. unsure if he would be a CIR candidate   LOS: 23 days    Ellwood Handler, PA-C 07/31/2022  Agree with above Looking at increasing strength this weekend to see if can go home Wound care for buttock decubiti opening

## 2022-07-31 NOTE — Progress Notes (Signed)
CARDIAC REHAB PHASE I   I assisted pt to [facility] prior to ambulation. Pt was ambulated through the hallway with IV stand and RW using contact guard assistance. Pt walked 10f, experiencing  mild sob that resolved with rest. Pt needed 3 standing rest breaks before returning to room and sitting up in chair.  [id number] 07/31/2022 JChristen Bame11:32 AM

## 2022-07-31 NOTE — Progress Notes (Signed)
Physical Therapy Treatment Patient Details Name: Joe [name] MRN: [id number] DOB: Mar 03, 1956 Today's Date: 07/31/2022   History of Present Illness 66 yo male admitted 07/08/22 for ascending aortic arch replacement with Bentall procedure and CABGx1 same date. Extubated 9/28. Ileus, Afib and CHF post procedure. Hosptial stay continue to be complicated by back pain and UTI. PMHx: congenital brain damage, speech delay, bil THA, PLIF, Lt TSA, PVC    PT Comments    Pt progressing with mobility. Today's session focused on gait endurance. Pt ambulated 161f min guard, requiring frequent standing rest breaks due to fatigue. Pt remains limited by generalized weakness, decreased activity tolerance, and impaired balance strategies/postural reactions. Pt requires excess verbal cues to sustain sternal precautions. Continue to recommend acute PT services to maximize functional mobility and independence prior to return home.    Recommendations for follow up therapy are one component of a multi-disciplinary discharge planning process, led by the attending physician.  Recommendations may be updated based on patient status, additional functional criteria and insurance authorization.  Follow Up Recommendations  Home health PT     Assistance Recommended at Discharge Frequent or constant Supervision/Assistance  Patient can return home with the following A little help with walking and/or transfers;A little help with bathing/dressing/bathroom;Help with stairs or ramp for entrance;Assist for transportation;Assistance with feeding   Equipment Recommendations  None recommended by PT    Recommendations for Other Services       Precautions / Restrictions Precautions Precautions: Sternal;Fall Precaution Comments: frequent cueing for precautions Restrictions Weight Bearing Restrictions: No     Mobility  Bed Mobility Overal bed mobility: Needs Assistance Bed Mobility: Supine to Sit     Supine to sit:  Min assist, HOB elevated     General bed mobility comments: min assist to elevate trunk from surface and scooting edge of edge, excess cues for sternal precautions    Transfers Overall transfer level: Needs assistance Equipment used: Rolling walker (2 wheels) Transfers: Sit to/from Stand Sit to Stand: Min guard           General transfer comment: min guard for line mgmt; pt able to propel self from bed with frequent cuing of pre mobility hand placement    Ambulation/Gait Ambulation/Gait assistance: Min guard Gait Distance (Feet): 120 Feet Assistive device: Rolling walker (2 wheels) Gait Pattern/deviations: Step-through pattern, Decreased stride length, Decreased dorsiflexion - left Gait velocity: decreased     General Gait Details: gait min guard with 4 standing rest breaks and education for energy conservation; pt able to negotiate turns with RW safely   Stairs             Wheelchair Mobility    Modified Rankin (Stroke Patients Only)       Balance Overall balance assessment: Needs assistance Sitting-balance support: Feet supported, No upper extremity supported Sitting balance-Leahy Scale: Fair Sitting balance - Comments: prolonged sitting EOB supevision during precaution education and line mgmt   Standing balance support: Bilateral upper extremity supported, Reliant on assistive device for balance Standing balance-Leahy Scale: Poor Standing balance comment: Reliant on RW for stability in standing, does not attempt removal of hands                            Cognition Arousal/Alertness: Awake/alert Behavior During Therapy: Flat affect Overall Cognitive Status: History of cognitive impairments - at baseline Area of Impairment: Attention  Current Attention Level: Sustained Memory: Decreased recall of precautions, Decreased short-term memory Following Commands: Follows one step commands consistently, Follows multi-step  commands inconsistently, Follows one step commands with increased time Safety/Judgement: Decreased awareness of safety     General Comments: Pt baseline is disoriented to time, day of week, year; pt responds mostly with yes/no        Exercises      General Comments General comments (skin integrity, edema, etc.): Monitored O2 during mobility, SpO2 readings >92% when pleth reliable      Pertinent Vitals/Pain Pain Assessment Pain Assessment: Faces Faces Pain Scale: Hurts little more Pain Location: back and suprapubic region Pain Descriptors / Indicators: Grimacing, Discomfort Pain Intervention(s): Monitored during session, Repositioned    Home Living                          Prior Function            PT Goals (current goals can now be found in the care plan section) Acute Rehab PT Goals Patient Stated Goal: home PT Goal Formulation: With patient/family Time For Goal Achievement: 08/06/22 Potential to Achieve Goals: Good Progress towards PT goals: Progressing toward goals    Frequency    Min 3X/week      PT Plan Current plan remains appropriate    Co-evaluation              AM-PAC PT "6 Clicks" Mobility   Outcome Measure  Help needed turning from your back to your side while in a flat bed without using bedrails?: A Little Help needed moving from lying on your back to sitting on the side of a flat bed without using bedrails?: A Little Help needed moving to and from a bed to a chair (including a wheelchair)?: A Little Help needed standing up from a chair using your arms (e.g., wheelchair or bedside chair)?: A Little Help needed to walk in hospital room?: A Little Help needed climbing 3-5 steps with a railing? : A Lot 6 Click Score: 17    End of Session Equipment Utilized During Treatment: Gait belt Activity Tolerance: Patient tolerated treatment well Patient left: in chair;with call bell/phone within reach Nurse Communication: Mobility  status PT Visit Diagnosis: Other abnormalities of gait and mobility (R26.89);Muscle weakness (generalized) (M62.81);Unsteadiness on feet (R26.81)     Time: 3254-9826 PT Time Calculation (min) (ACUTE ONLY): 31 min  Charges:  $Therapeutic Exercise: 8-22 mins $Therapeutic Activity: 8-22 mins                    Chipper Oman, SPT    Apopka Dovie Kapusta 07/31/2022, 5:49 PM

## 2022-07-31 NOTE — Consult Note (Signed)
[facility] Nurse Consult Note: Reason for Consult:Stage 2 pressure injury to apex of gluteal cleft Wound type:pressure plus moisture Pressure Injury POA: Yes Measurement:2cm x 1cm x 0.1cm Wound WWZ:LYTS, moist Drainage (amount, consistency, odor) scant serous Periwound:intact Dressing procedure/placement/frequency: I have provided Nursing with conservative care orders for this area using an antimicrobial nonadherent (xeroform) gauze topped with our house silicone foam dressing oriented so that the "tip" points away from the anus. Turning and repositioning is in place, but I have added to minimize time in the supine position. Heels are floated.  [facility] nursing team will not follow, but will remain available to this patient, the nursing and medical teams.  Please re-consult if needed.  Thank you for inviting [place] to participate in this patient's Plan of Care.  Maudie Flakes, MSN, RN, CNS, [place], Serita Grammes, [employer], [employer] phone:  [phone]

## 2022-07-31 NOTE — Progress Notes (Addendum)
Patient ID: Joe [name], male   DOB: [birth year], 66 y.o.   MRN: [id number]     Advanced Heart Failure Rounding Note  PCP-Cardiologist: Skeet Latch, MD   Subjective:    9/27 s/p Bentall with bioprosthetic aortic valve . Post-op course c/b ileus.   S/p DCCV 10/12--> NSR/SB  CO-OX 65%.   Denies pain. Denies SOB. Had BM today.   Objective:   Weight Range: 59.8 kg Body mass index is 20.66 kg/m.   Vital Signs:   Temp:  [97.6 F (36.4 C)-98.3 F (36.8 C)] 98.3 F (36.8 C) (10/20 0751) Pulse Rate:  [47-55] 50 (10/20 0751) Resp:  [11-24] 14 (10/20 0751) BP: (102-161)/(54-96) 114/65 (10/20 0751) SpO2:  [86 %-98 %] 93 % (10/20 0751) Weight:  [59.8 kg] 59.8 kg (10/20 0600) Last BM Date : 07/30/22 (night shift)  Weight change: Filed Weights   07/29/22 0500 07/30/22 0600 07/31/22 0600  Weight: 69.8 kg 66.8 kg 59.8 kg    Intake/Output:   Intake/Output Summary (Last 24 hours) at 07/31/2022 0915 Last data filed at 07/31/2022 0807 Gross per 24 hour  Intake 184.96 ml  Output 3000 ml  Net -2815.04 ml     CVP 7-8 Physical Exam   General:  No resp difficulty HEENT: normal Neck: supple. no JVD. Carotids 2+ bilat; no bruits. No lymphadenopathy or thryomegaly appreciated. Cor: PMI nondisplaced. Regular rate & rhythm. No rubs, gallops or murmurs. Sternal incision approximated.  Lungs: clear Abdomen: soft, nontender, nondistended. No hepatosplenomegaly. No bruits or masses. Good bowel sounds. Extremities: no cyanosis, clubbing, rash, edema. LUE PICC  Neuro: alert & orientedx3, cranial nerves grossly intact. moves all 4 extremities w/o difficulty. Affect pleasant GU: Foley    Telemetry   SB upper 40s-50s.   Labs    CBC No results for input(s): "WBC", "NEUTROABS", "HGB", "HCT", "MCV", "PLT" in the last 72 hours.  Basic Metabolic Panel Recent Labs    07/30/22 0336 07/30/22 0456 07/31/22 0620  NA 125* 132* 134*  K >7.5* 3.6 2.7*  CL 86* 78* 78*  CO2 37* 39*  41*  GLUCOSE 102* 105* 107*  BUN 20 22 24*  CREATININE 1.14 1.30* 1.13  CALCIUM 8.2* 9.1 9.0  MG 1.7  --  2.3   Liver Function Tests No results for input(s): "AST", "ALT", "ALKPHOS", "BILITOT", "PROT", "ALBUMIN" in the last 72 hours.  BNP: BNP (last 3 results) Recent Labs    06/25/22 1829  BNP 436.2*     Imaging  reviewed  Medications:     Scheduled Medications:  amiodarone  400 mg Oral BID   apixaban  5 mg Oral BID   aspirin EC  81 mg Oral Daily   atorvastatin  80 mg Oral Daily   Chlorhexidine Gluconate Cloth  6 each Topical Daily   metoCLOPramide (REGLAN) injection  10 mg Intravenous Q8H   pantoprazole  40 mg Oral Daily   potassium chloride  40 mEq Oral BID   sertraline  25 mg Oral Daily   sodium chloride flush  10-40 mL Intracatheter Q12H   sodium chloride flush  3 mL Intravenous Q12H   tamsulosin  0.4 mg Oral BID   torsemide  40 mg Oral Daily    Infusions:  sodium chloride Stopped (07/22/22 1203)   cefTRIAXone (ROCEPHIN)  IV 2 g (07/31/22 0838)   promethazine (PHENERGAN) injection (IM or IVPB)     sodium chloride      PRN Medications: sodium chloride, acetaminophen, bisacodyl, diazepam, hydrocortisone, hydrocortisone, melatonin, ondansetron (ZOFRAN) IV,  mouth rinse, polyethylene glycol, promethazine (PHENERGAN) injection (IM or IVPB), senna-docusate, sodium chloride flush, sodium chloride flush, traMADol    Patient Profile  66 y/o male recently found to have severe LV dysfunction in setting of markedly dilated aortic root (67 mm) and severe AI (tricuspid AoV) and single vessel CAD, now s/p Bentall and CABG x 1 w/ SVG-RCA. AHF team consulted to assist w/ post-operative management of systolic heart failure.     Assessment/Plan   Ascending Aortic Aneurysm/ Severe AI  - CTA  w/ severe dilation at the sinus of Valsalva, 67 mm. Moderate dilation of mid ascending aorta, 44 mm. Tricuspid AoV - s/p Bentall with bioprosthetic aortic valve 9/27 - intraoperative  TEE, no perivalvular leak post repair  - Echo 10/10 EF 25% RV function improving. Moderate to severe MR (in setting of LBBB) AVR ok   - CTA 10/16 negative for dissection.   2. CAD - single vessel, 60-70% pRCA  - s/p CABG x 1, SVG- RCA  - ASA + statin  - No BB for now w/ bradycardia     3. Chronic Systolic Heart Failure  - newly diagnosed; Echo 9/23 EF 25-30%, LV severely dilated, severe AI, Global HK, RV ok  - NICM, severity out of proportion to degree of CAD  - likely valvular CM in setting of severe AI +/- LBBB CM  - now post op bioprosthetic AVR + CABG. Intraoperative TEE LVEF 20-25%, RV ok - With junctional rhythm, will hold off on beta blocker and digoxin for now to avoid worsening A-V conduction. Eventual CRT-D (see below).  - Hypotensive 10/04 in setting of urinary retention.  - Echo 10/9- EF 25% RV function improving. Moderate to severe MR (in setting of LBBB) AVR ok   - CVP 7-8. Cut back torsemide 20 mg daily  - K 2.7 . Add 12.5 mg spironolactone daily. Supp K  - GDMT limited due to hypotension.  - Continue midodrine to 5 mg tid.  - would avoid SGLT2i given urinary retention/foley   4. Junctional rhythm - Resolved  - Has epicardial LV lead. EP following. Had been considering CRT-D prior to discharge but given need for DC-CV and prolonged hospitalization likely better wait at least several weeks until he recovers   5. LBBB -Will eventually need epicardial lead hooked up for CRT see discussion above   6. Anemia - Post-op, 1 u RBCs 10/04.  7. Thrombocytopenia - Resolved.  8. Atrial fib/flutter, post-op - s/p DCCV 10/12>>NSR  - Back in A fib 10/16 - Maintaining SR--bradycardic.  - Cut amio back to 200 mg twice a day.  - Eliquis 5 mg bid   9. Urinary retention/UTI  - Followed by [facility] every 6 month and has been on flomax for some time.  - Recurrent bladder distention. CT abd--> 1600 cc urine.  Foley replaced  with 1300 cc out.  - UA +. Completing rocephin  today.  - Continues w/ foley.    10. Post-op ileus/ Constipation  - 10/9 KUB c/w ileus   - Last BM 10/13 - Ongoing bowel distention on CT.  - Active BS on exam, + flatus - Continue bowel regimen, given another dose Reglan today, encouraged ambulation   11. Hypokalemia/Hypomagnesemia  - K 2.7 --> supp K. Add 12.5 mg spiro - Mag 2.3   12. Hyponatremia - Na 134  13. Back Pain  - CTA- negative for dissection  - suspect 2/2 bladder distention   14. RUE Pain/ PICC Line Associated DVT  -  [place] + for Acute nonocclusive DVT surrounding the picc line in the subclavian vein -PICC pulled  -Already on Eliquis for Afib  15. Situational Depression  - started sertraline 10/18   Continue to ambulate.   Length of Stay: Luis M. Cintron, NP-C  9:15 AM  Patient seen and examined with the above-signed Advanced Practice Provider and/or Housestaff. I personally reviewed laboratory data, imaging studies and relevant notes. I independently examined the patient and formulated the important aspects of the plan. I have edited the note to reflect any of my changes or salient points. I have personally discussed the plan with the patient and/or family.  Feeling better today. But sore but getting wound care. Had several BMs. No CP or SOB. Volume status improved. Foley still in.  Off midodrine BP stable. Remains in NSR.   General:  Sitting in chair . No resp difficulty HEENT: normal Neck: supple. no JVD. Carotids 2+ bilat; no bruits. No lymphadenopathy or thryomegaly appreciated. Cor: PMI nondisplaced. Regular rate & rhythm. No rubs, gallops or murmurs. Lungs: clear dull at bases  Abdomen: soft, nontender, nondistended. No hepatosplenomegaly. No bruits or masses. Good bowel sounds. Extremities: no cyanosis, clubbing, rash, edema Neuro: alert & orientedx3, cranial nerves grossly intact. moves all 4 extremities w/o difficulty. Affect pleasant  He is much improved. Continue current regimen. Continue rehab.  Possibly home early next week. Will need outpatient f/u in [facility] to wean Foley.   HF team will see again on Monday.  Glori Bickers, MD  2:58 PM

## 2022-08-01 LAB — CULTURE, BLOOD (ROUTINE X 2)
Culture: NO GROWTH
Culture: NO GROWTH
Special Requests: ADEQUATE
Special Requests: ADEQUATE

## 2022-08-01 LAB — MAGNESIUM: Magnesium: 2.3 mg/dL (ref 1.7–2.4)

## 2022-08-01 LAB — COOXEMETRY PANEL
Carboxyhemoglobin: 2.2 % — ABNORMAL HIGH (ref 0.5–1.5)
Methemoglobin: <0.7 % (ref 0.0–1.5)
O2 Saturation: 74.3 %
Total hemoglobin: 9.5 g/dL — ABNORMAL LOW (ref 12.0–16.0)

## 2022-08-01 LAB — BASIC METABOLIC PANEL
Anion gap: 11 (ref 5–15)
BUN: 24 mg/dL — ABNORMAL HIGH (ref 8–23)
CO2: 38 mmol/L — ABNORMAL HIGH (ref 22–32)
Calcium: 9 mg/dL (ref 8.9–10.3)
Chloride: 85 mmol/L — ABNORMAL LOW (ref 98–111)
Creatinine, Ser: 1.16 mg/dL (ref 0.61–1.24)
GFR, Estimated: >60 mL/min (ref >60)
Glucose, Bld: 96 mg/dL (ref 70–99)
Potassium: 3.7 mmol/L (ref 3.5–5.1)
Sodium: 134 mmol/L — ABNORMAL LOW (ref 135–145)

## 2022-08-01 NOTE — Progress Notes (Addendum)
CARDIAC REHAB PHASE I   PRE:  Rate/Rhythm: 54 NSR  BP:  Sitting: 144/85      SaO2: 95 RA  MODE:  Ambulation: 85 ft   POST:  Rate/Rhythm: 83 NSR  BP:  Sitting: 181/81       SaO2: 95 RA  Pt was ambulated with x2 assist with IV stand, RW, and gait belt. Pt needed moderate assistance getting out of chair and contact guard assistance while walking. Pt was returned to chair with his feet elevated, pt c/p of headache. Nurse aware of BP. After 5 minutes of rest pt BP is 165/93.   Joe [name]  11:38 AM 08/01/2022

## 2022-08-01 NOTE — Progress Notes (Signed)
[address]       [place] [address]             [phone]      9 Days Post-Op Procedure(s) (LRB): TRANSESOPHAGEAL ECHOCARDIOGRAM (TEE) (N/A) CARDIOVERSION (N/A) Subjective: No specific complaints other than soreness in his buttock region wound  Objective: Vital signs in last 24 hours: Temp:  [97.6 F (36.4 C)-98.2 F (36.8 C)] 97.8 F (36.6 C) (10/21 0733) Pulse Rate:  [50-56] 56 (10/21 0733) Cardiac Rhythm: Heart block;Bundle branch block (10/20 1900) Resp:  [14-19] 16 (10/21 0733) BP: (91-147)/(53-66) 147/66 (10/21 0733) SpO2:  [95 %-96 %] 96 % (10/21 0733) Weight:  [67.5 kg] 67.5 kg (10/21 0403)  Hemodynamic parameters for last 24 hours: CVP:  [5 mmHg-9 mmHg] 7 mmHg  Intake/Output from previous day: 10/20 0701 - 10/21 0700 In: 10 [I.V.:10] Out: 1875 [Urine:1875] Intake/Output this shift: No intake/output data recorded.  General appearance: alert and no distress Heart: regular rate and rhythm Lungs: Diminished in bases Abdomen: Soft nontender Extremities: No edema Wound: Surgical incisions healing well  Lab Results: No results for input(s): "WBC", "HGB", "HCT", "PLT" in the last 72 hours. BMET:  Recent Labs    07/31/22 1319 08/01/22 0215  NA 132* 134*  K 3.0* 3.7  CL 76* 85*  CO2 42* 38*  GLUCOSE 142* 96  BUN 27* 24*  CREATININE 1.18 1.16  CALCIUM 9.0 9.0    PT/INR: No results for input(s): "LABPROT", "INR" in the last 72 hours. ABG    Component Value Date/Time   PHART 7.342 (L) 07/10/2022 0546   HCO3 23.4 07/10/2022 0546   TCO2 30 07/16/2022 0711   ACIDBASEDEF 2.0 07/10/2022 0546   O2SAT 74.3 08/01/2022 0310   CBG (last 3)  Recent Labs    07/31/22 0628  GLUCAP 102*    Meds Scheduled Meds:  amiodarone  200 mg Oral BID   apixaban  5 mg Oral BID   aspirin EC  81 mg Oral Daily   atorvastatin  80 mg Oral Daily   Chlorhexidine Gluconate Cloth  6 each Topical Daily   metoCLOPramide (REGLAN) injection  10 mg  Intravenous Q8H   pantoprazole  40 mg Oral Daily   potassium chloride  40 mEq Oral BID   sertraline  25 mg Oral Daily   sodium chloride flush  10-40 mL Intracatheter Q12H   sodium chloride flush  3 mL Intravenous Q12H   spironolactone  12.5 mg Oral Daily   tamsulosin  0.4 mg Oral BID   torsemide  20 mg Oral Daily   Continuous Infusions:  sodium chloride Stopped (07/22/22 1203)   promethazine (PHENERGAN) injection (IM or IVPB)     sodium chloride     PRN Meds:.sodium chloride, acetaminophen, bisacodyl, diazepam, hydrocortisone, hydrocortisone, melatonin, ondansetron (ZOFRAN) IV, mouth rinse, polyethylene glycol, promethazine (PHENERGAN) injection (IM or IVPB), senna-docusate, sodium chloride flush, sodium chloride flush, traMADol  Xrays No results found.  Assessment/Plan: S/P Procedure(s) (LRB): TRANSESOPHAGEAL ECHOCARDIOGRAM (TEE) (N/A) CARDIOVERSION (N/A)   1 afebrile, vitals stable, systolic blood pressure 81K to 140's range, sinus bradycardia at times in the 50s.  CVP-7, advanced heart failure team assisting with GDMT.  Has been limited due to hypotension.  Continues low-dose diuretics.  Remains in sinus rhythm.  Amiodarone has been decreased to 200 mg daily.  Co. oximetry is 74.3 today 2 O2 saturations good on room air 3 good urine output-normal creatinine with slightly elevated BUN 4 potassium improved 5 Wound care  nurse assisting with buttock wound care dressing  6 continue rehab, disposition is not completely certain  LOS: 24 days    John Giovanni PA-C Pager [phone] 08/01/2022

## 2022-08-02 LAB — BASIC METABOLIC PANEL
Anion gap: 12 (ref 5–15)
BUN: 23 mg/dL (ref 8–23)
CO2: 35 mmol/L — ABNORMAL HIGH (ref 22–32)
Calcium: 9.2 mg/dL (ref 8.9–10.3)
Chloride: 88 mmol/L — ABNORMAL LOW (ref 98–111)
Creatinine, Ser: 1.06 mg/dL (ref 0.61–1.24)
GFR, Estimated: >60 mL/min (ref >60)
Glucose, Bld: 105 mg/dL — ABNORMAL HIGH (ref 70–99)
Potassium: 3.9 mmol/L (ref 3.5–5.1)
Sodium: 135 mmol/L (ref 135–145)

## 2022-08-02 LAB — MAGNESIUM: Magnesium: 2.3 mg/dL (ref 1.7–2.4)

## 2022-08-02 NOTE — Progress Notes (Signed)
[address]       [place] [address]             [phone]      10 Days Post-Op Procedure(s) (LRB): TRANSESOPHAGEAL ECHOCARDIOGRAM (TEE) (N/A) CARDIOVERSION (N/A) Subjective Sleepy this am  Objective: Vital signs in last 24 hours: Temp:  [97.8 F (36.6 C)-98.3 F (36.8 C)] 98.1 F (36.7 C) (10/22 0736) Pulse Rate:  [47-56] 52 (10/22 0736) Cardiac Rhythm: Junctional rhythm (10/21 1904) Resp:  [10-20] 20 (10/22 0736) BP: (144-184)/(63-93) 158/68 (10/22 0736) SpO2:  [93 %-96 %] 95 % (10/22 0736)  Hemodynamic parameters for last 24 hours: CVP:  [0 mmHg-5 mmHg] 3 mmHg  Intake/Output from previous day: 10/21 0701 - 10/22 0700 In: 10 [I.V.:10] Out: 1525 [Urine:1525] Intake/Output this shift: No intake/output data recorded.  General appearance: distracted, fatigued, and no distress Heart: regular rate and rhythm and brady Lungs: clear to auscultation bilaterally Abdomen: soft, non-tender Extremities: no edema Wound: incis healing well  Lab Results: No results for input(s): "WBC", "HGB", "HCT", "PLT" in the last 72 hours. BMET:  Recent Labs    08/01/22 0215 08/02/22 0530  NA 134* 135  K 3.7 3.9  CL 85* 88*  CO2 38* 35*  GLUCOSE 96 105*  BUN 24* 23  CREATININE 1.16 1.06  CALCIUM 9.0 9.2    PT/INR: No results for input(s): "LABPROT", "INR" in the last 72 hours. ABG    Component Value Date/Time   PHART 7.342 (L) 07/10/2022 0546   HCO3 23.4 07/10/2022 0546   TCO2 30 07/16/2022 0711   ACIDBASEDEF 2.0 07/10/2022 0546   O2SAT 74.3 08/01/2022 0310   CBG (last 3)  Recent Labs    07/31/22 0628  GLUCAP 102*    Meds Scheduled Meds:  amiodarone  200 mg Oral BID   apixaban  5 mg Oral BID   aspirin EC  81 mg Oral Daily   atorvastatin  80 mg Oral Daily   Chlorhexidine Gluconate Cloth  6 each Topical Daily   metoCLOPramide (REGLAN) injection  10 mg Intravenous Q8H   pantoprazole  40 mg Oral Daily   potassium chloride  40 mEq Oral BID    sertraline  25 mg Oral Daily   sodium chloride flush  10-40 mL Intracatheter Q12H   sodium chloride flush  3 mL Intravenous Q12H   spironolactone  12.5 mg Oral Daily   tamsulosin  0.4 mg Oral BID   torsemide  20 mg Oral Daily   Continuous Infusions:  sodium chloride Stopped (07/22/22 1203)   promethazine (PHENERGAN) injection (IM or IVPB)     sodium chloride     PRN Meds:.sodium chloride, acetaminophen, bisacodyl, diazepam, hydrocortisone, hydrocortisone, melatonin, ondansetron (ZOFRAN) IV, mouth rinse, polyethylene glycol, promethazine (PHENERGAN) injection (IM or IVPB), senna-docusate, sodium chloride flush, sodium chloride flush, traMADol  Xrays No results found.  Assessment/Plan: S/P Procedure(s) (LRB): TRANSESOPHAGEAL ECHOCARDIOGRAM (TEE) (N/A) CARDIOVERSION (N/A)  1 afebrile, SBP 140s to 180s-doubt accuracy because they have been taking blood pressure and his leg, CVP 3,Sinus brady with PVC's. Advanced heart failure team managing medication regimen/GDMT as able.  Hypotension has been limiting. No further A-fib, currently on amiodarone 200 mg daily.  Co. oximetry not done today.  Heart failure team to see patient tomorrow. 2 oxygen saturations good on room air 3 good urine output 4 normal renal function, hypochloremia improving over time, alkalosis improving over time, only on low-dose torsemide 20 mg daily with 12.5 mg spironolactone 5 requiring moderate  assistance with ambulation.  Continues wound care for sacral wound 6 disposition is still uncertain, elderly mother can probably not manage and uncertain if home health care would provide enough to meet patient's needs- will get TOC to asssist       LOS: 25 days    John Giovanni PA-C Pager [phone] 08/02/2022

## 2022-08-02 NOTE — Progress Notes (Signed)
Patient attempted to ambulate in the hallway, patient was able to ambulate in room 24fet. With rolling walker and nursing staff. Patient with complaints of dizziness and wanting to go back to bed. Patient assisted to sit in chair call bell within reach family at bedside. Alanee Ting, KBettina GaviaRN

## 2022-08-03 ENCOUNTER — Ambulatory Visit: Payer: Medicare Other | Admitting: Student

## 2022-08-03 ENCOUNTER — Inpatient Hospital Stay (HOSPITAL_COMMUNITY): Payer: Medicare Other

## 2022-08-03 DIAGNOSIS — R079 Chest pain, unspecified: Secondary | ICD-10-CM | POA: Diagnosis not present

## 2022-08-03 DIAGNOSIS — Z95828 Presence of other vascular implants and grafts: Secondary | ICD-10-CM | POA: Diagnosis not present

## 2022-08-03 LAB — COOXEMETRY PANEL
Carboxyhemoglobin: 1.8 % — ABNORMAL HIGH (ref 0.5–1.5)
Methemoglobin: <0.7 % (ref 0.0–1.5)
O2 Saturation: 68.1 %
Total hemoglobin: 9.8 g/dL — ABNORMAL LOW (ref 12.0–16.0)

## 2022-08-03 LAB — BASIC METABOLIC PANEL
Anion gap: 8 (ref 5–15)
BUN: 23 mg/dL (ref 8–23)
CO2: 34 mmol/L — ABNORMAL HIGH (ref 22–32)
Calcium: 9 mg/dL (ref 8.9–10.3)
Chloride: 94 mmol/L — ABNORMAL LOW (ref 98–111)
Creatinine, Ser: 1.15 mg/dL (ref 0.61–1.24)
GFR, Estimated: >60 mL/min (ref >60)
Glucose, Bld: 98 mg/dL (ref 70–99)
Potassium: 4.3 mmol/L (ref 3.5–5.1)
Sodium: 136 mmol/L (ref 135–145)

## 2022-08-03 LAB — MAGNESIUM: Magnesium: 2.3 mg/dL (ref 1.7–2.4)

## 2022-08-03 LAB — CBC
HCT: 28.4 % — ABNORMAL LOW (ref 39.0–52.0)
Hemoglobin: 9.5 g/dL — ABNORMAL LOW (ref 13.0–17.0)
MCH: 33.2 pg (ref 26.0–34.0)
MCHC: 33.5 g/dL (ref 30.0–36.0)
MCV: 99.3 fL (ref 80.0–100.0)
Platelets: 321 10*3/uL (ref 150–400)
RBC: 2.86 MIL/uL — ABNORMAL LOW (ref 4.22–5.81)
RDW: 15.4 % (ref 11.5–15.5)
WBC: 7.1 10*3/uL (ref 4.0–10.5)
nRBC: 0 % (ref 0.0–0.2)

## 2022-08-03 LAB — ECHOCARDIOGRAM LIMITED
AR max vel: 2.63 cm2
AV Area VTI: 2.71 cm2
AV Area mean vel: 2.49 cm2
AV Mean grad: 4.5 mmHg
AV Peak grad: 8.4 mmHg
Ao pk vel: 1.45 m/s
Area-P 1/2: 2.69 cm2
Height: 67 in
Weight: 2380.97 oz

## 2022-08-03 LAB — TROPONIN I (HIGH SENSITIVITY)
Troponin I (High Sensitivity): 36 ng/L — ABNORMAL HIGH (ref <18)
Troponin I (High Sensitivity): 35 ng/L — ABNORMAL HIGH (ref <18)

## 2022-08-03 MED ORDER — POTASSIUM CHLORIDE CRYS ER 20 MEQ PO TBCR
40.0000 meq | EXTENDED_RELEASE_TABLET | Freq: Every day | ORAL | Status: DC
  Administered 2022-08-03 – 2022-08-13 (×11): 40 meq via ORAL
  Filled 2022-08-03 (×11): qty 2

## 2022-08-03 MED ORDER — AMIODARONE HCL 200 MG PO TABS
200.0000 mg | ORAL_TABLET | Freq: Every day | ORAL | Status: DC
  Administered 2022-08-04 – 2022-08-13 (×10): 200 mg via ORAL
  Filled 2022-08-03 (×11): qty 1

## 2022-08-03 MED ORDER — TORSEMIDE 20 MG PO TABS: 20.0000 mg | ORAL_TABLET | ORAL | Status: DC

## 2022-08-03 MED ORDER — LACTATED RINGERS IV BOLUS
500.0000 mL | Freq: Once | INTRAVENOUS | Status: AC
  Administered 2022-08-03: 500 mL via INTRAVENOUS

## 2022-08-03 NOTE — Progress Notes (Addendum)
Inpatient Rehab Admissions Coordinator:   Per therapy recommendations,  patient was screened for CIR candidacy by Brittaney Beaulieu, MS, CCC-SLP . At this time, Pt. Appears to be a a potential candidate for CIR. I will request   order for rehab consult per protocol for full assessment. Please contact me any with questions.  Aaminah Forrester, MS, CCC-SLP Rehab Admissions Coordinator  [phone] (celll) [phone] (office)  

## 2022-08-03 NOTE — Progress Notes (Addendum)
      [address]       [place] [address]             [phone]      11 Days Post-Op Procedure(s) (LRB): TRANSESOPHAGEAL ECHOCARDIOGRAM (TEE) (N/A) CARDIOVERSION (N/A) Subjective: Patient states his neck hurts today  Objective: Vital signs in last 24 hours: Temp:  [97.6 F (36.4 C)-98.3 F (36.8 C)] 98.3 F (36.8 C) (10/23 0332) Pulse Rate:  [49-63] 51 (10/23 0332) Cardiac Rhythm: Bundle branch block;Heart block (10/22 1945) Resp:  [10-15] 15 (10/23 0332) BP: (131-154)/(69-91) 148/69 (10/23 0332) SpO2:  [92 %-93 %] 92 % (10/23 0332)  Hemodynamic parameters for last 24 hours: CVP:  [0 mmHg-4 mmHg] 0 mmHg  Intake/Output from previous day: 10/22 0701 - 10/23 0700 In: 10 [I.V.:10] Out: 1150 [Urine:1150] Intake/Output this shift: No intake/output data recorded.  General appearance: alert, cooperative, fatigued, and no distress Neurologic: intact Heart: regular rate and rhythm-sinus bradycardia Lungs: clear to auscultation bilaterally Abdomen: soft, non-tender; bowel sounds normal; no masses,  no organomegaly Extremities: extremities normal, atraumatic, no cyanosis or edema. R arm swelling Wound: Clean, dry, intact  Lab Results: No results for input(s): "WBC", "HGB", "HCT", "PLT" in the last 72 hours. BMET:  Recent Labs    08/02/22 0530 08/03/22 0345  NA 135 136  K 3.9 4.3  CL 88* 94*  CO2 35* 34*  GLUCOSE 105* 98  BUN 23 23  CREATININE 1.06 1.15  CALCIUM 9.2 9.0    PT/INR: No results for input(s): "LABPROT", "INR" in the last 72 hours. ABG    Component Value Date/Time   PHART 7.342 (L) 07/10/2022 0546   HCO3 23.4 07/10/2022 0546   TCO2 30 07/16/2022 0711   ACIDBASEDEF 2.0 07/10/2022 0546   O2SAT 74.3 08/01/2022 0310   CBG (last 3)  No results for input(s): "GLUCAP" in the last 72 hours.  Assessment/Plan: S/P Procedure(s) (LRB): TRANSESOPHAGEAL ECHOCARDIOGRAM (TEE) (N/A) CARDIOVERSION (N/A)  CV: Chronic systolic HF. Sinus  rhythm-sinus bradycardia, rate 51 this AM. SBP 148, unsure of how accurate with BP taken from leg. No further afib, on amiodarone '200mg'$  BID. Off midodrine. AHF following and managing HF with GDMT as able.  Pulm: Saturating 99% on RA, continue IS and ambulation. GI: hx of postop ileus improved. +BM yesterday, no tenderness or distension. Renal: Hx of urinary retention, improved. Catheter in place. Cr 1.15, on torsemide '20mg'$  every other day and spironolactone 12.'5mg'$  QD. Good UO. Will d/c with foley and follow up with urology as an outpatient.  Neck: Complaining of neck stiffness, neurologically intact. Likely stiffness from lying in bed. Will give muscle relaxer if persists.  R arm swelling: Previous DVT, swelling slightly improved. No redness, not hot to touch. On Eliquis. Will monitor.  Dispo: Previously recommended for home health at d/c. Deconditioned, may need SNF. TOC consulted.    LOS: 26 days    Magdalene River, PA-C 08/03/2022  Agree with above Pt appeared more fatigued than when I saw him on Friday Having AHF team evaluate him for optimizing CHF management Buttock issue has him more depressed Will most definitely need SNF following DC. Discussed with mother

## 2022-08-03 NOTE — Progress Notes (Signed)
  Echocardiogram 2D Echocardiogram has been performed.  Darlina Sicilian M 08/03/2022, 3:29 PM

## 2022-08-03 NOTE — Progress Notes (Signed)
Physical Therapy Treatment Patient Details Name: Joe [name] MRN: [id number] DOB: 1956-08-30 Today's Date: 08/03/2022   History of Present Illness 66 yo male admitted 07/08/22 for ascending aortic arch replacement with Bentall procedure and CABGx1 same date. Extubated 9/28. Ileus, Afib and CHF post procedure. Hosptial stay continue to be complicated by back pain and UTI. PMHx: congenital brain damage, speech delay, bil THA, PLIF, Lt TSA, PVC    PT Comments    Pt received supine pleasant and agreeable to session with slow but steady progress toward acute goals secondary to pain and fatigue. Pt able to perform all bed mobility with min assist, tranfer sit<>stand with RW and min assist and demonstrate gait with RW with no LOB, however needing min assist to negotiate obstacles and to steady as pt with x2 instances of knee buckling and noted instability. Pt needing cues throughout session to maintain sternal precautions. Pt continues to be limited by impaired balance/postural reactions, decreased activity tolerance, and general weakness. Pt without adequate physical assist at home and anticipate pt able to tolerate intensity of AIR level therapies to advance to mod I level before returning to home; discussed with supervising PT and disposition updated to address deficits and maximize functional independence and decrease caregiver burden. If AIR not an option pt will need ST-SNF before return home. Pt continues to benefit from skilled PT services to progress toward functional mobility goals.      Recommendations for follow up therapy are one component of a multi-disciplinary discharge planning process, led by the attending physician.  Recommendations may be updated based on patient status, additional functional criteria and insurance authorization.  Follow Up Recommendations  Acute inpatient rehab (3hours/day)     Assistance Recommended at Discharge Frequent or constant Supervision/Assistance   Patient can return home with the following A little help with walking and/or transfers;A little help with bathing/dressing/bathroom;Help with stairs or ramp for entrance;Assist for transportation;Assistance with feeding   Equipment Recommendations  None recommended by PT    Recommendations for Other Services       Precautions / Restrictions Precautions Precautions: Sternal;Fall Precaution Comments: frequent cueing for precautions Restrictions Weight Bearing Restrictions: Yes     Mobility  Bed Mobility Overal bed mobility: Needs Assistance Bed Mobility: Supine to Sit Rolling: Min assist Sidelying to sit: Min assist       General bed mobility comments: min assist to rool and to elevate trunk from surface, cues for sternal precautions    Transfers Overall transfer level: Needs assistance Equipment used: Rolling walker (2 wheels) Transfers: Sit to/from Stand Sit to Stand: Min assist           General transfer comment: min assist to initiate mobility and to steady on rise, cues for hand placement    Ambulation/Gait Ambulation/Gait assistance: Min assist, Min guard Gait Distance (Feet): 45 Feet Assistive device: Rolling walker (2 wheels) Gait Pattern/deviations: Step-through pattern, Decreased stride length, Decreased dorsiflexion - left Gait velocity: decreased     General Gait Details: min assist to steady and to negoatiate obstacles in room, distance limited to fatigue, pt with x2 instances of knee buckling needing min assist to correct   Stairs             Wheelchair Mobility    Modified Rankin (Stroke Patients Only)       Balance Overall balance assessment: Needs assistance Sitting-balance support: Feet supported, No upper extremity supported Sitting balance-Leahy Scale: Fair Sitting balance - Comments: prolonged sitting EOB supevision during precaution education and  line mgmt   Standing balance support: Bilateral upper extremity supported,  Reliant on assistive device for balance Standing balance-Leahy Scale: Poor Standing balance comment: Reliant on RW for stability in standing, does not attempt removal of hands                            Cognition Arousal/Alertness: Awake/alert Behavior During Therapy: Flat affect Overall Cognitive Status: History of cognitive impairments - at baseline Area of Impairment: Attention                     Memory: Decreased recall of precautions, Decreased short-term memory Following Commands: Follows one step commands consistently, Follows multi-step commands inconsistently, Follows one step commands with increased time Safety/Judgement: Decreased awareness of safety     General Comments: Pt baseline is disoriented to time, day of week, year; pt responds mostly with yes/no, cues throughout to adhere to sternal precautions        Exercises      General Comments General comments (skin integrity, edema, etc.): VSS on RA      Pertinent Vitals/Pain Pain Assessment Pain Assessment: Faces Faces Pain Scale: Hurts little more Pain Location: chest at incision Pain Descriptors / Indicators: Grimacing, Discomfort Pain Intervention(s): Monitored during session, Limited activity within patient's tolerance, Repositioned    Home Living                          Prior Function            PT Goals (current goals can now be found in the care plan section) Acute Rehab PT Goals PT Goal Formulation: With patient/family Time For Goal Achievement: 08/06/22    Frequency    Min 3X/week      PT Plan Discharge plan needs to be updated    Co-evaluation              AM-PAC PT "6 Clicks" Mobility   Outcome Measure  Help needed turning from your back to your side while in a flat bed without using bedrails?: A Little Help needed moving from lying on your back to sitting on the side of a flat bed without using bedrails?: A Little Help needed moving to and  from a bed to a chair (including a wheelchair)?: A Lot (for cues) Help needed standing up from a chair using your arms (e.g., wheelchair or bedside chair)?: A Lot Help needed to walk in hospital room?: A Lot (for cues) Help needed climbing 3-5 steps with a railing? : A Lot 6 Click Score: 14    End of Session Equipment Utilized During Treatment: Gait belt Activity Tolerance: Patient tolerated treatment well;Patient limited by fatigue Patient left: in chair;with call bell/phone within reach;with family/visitor present Nurse Communication: Mobility status PT Visit Diagnosis: Other abnormalities of gait and mobility (R26.89);Muscle weakness (generalized) (M62.81);Unsteadiness on feet (R26.81)     Time: 4128-7867 PT Time Calculation (min) (ACUTE ONLY): 25 min  Charges:  $Gait Training: 8-22 mins $Therapeutic Activity: 8-22 mins                     Jalilah Wiltsie R. PTA Acute Rehabilitation Services Office: [place] 08/03/2022, 2:15 PM

## 2022-08-03 NOTE — Progress Notes (Addendum)
Patient ID: Joe [name], male   DOB: 1955/10/22, 66 y.o.   MRN: [id number]     Advanced Heart Failure Rounding Note  PCP-Cardiologist: Skeet Latch, MD   Subjective:    9/27 s/p Bentall with bioprosthetic aortic valve . Post-op course c/b ileus.   S/p DCCV 10/12--> NSR/SB  BP elevated last few days but measurements taken from leg. Off midodrine. Dizzy with position changes.  Seems more fatigued this am. Mother reports he has been more lethargic last 2 mornings. Did not ambulate as much yesterday.   No CP or dyspnea. Had BM yesterday.  CVP 4-5. No CO-OX today   Objective:   Weight Range: 67.5 kg Body mass index is 23.31 kg/m.   Vital Signs:   Temp:  [97.6 F (36.4 C)-98.3 F (36.8 C)] 98.3 F (36.8 C) (10/23 0332) Pulse Rate:  [49-63] 51 (10/23 0332) Resp:  [10-20] 15 (10/23 0332) BP: (131-158)/(68-91) 148/69 (10/23 0332) SpO2:  [92 %-95 %] 92 % (10/23 0332) Last BM Date : 08/02/22  Weight change: Filed Weights   07/30/22 0600 07/31/22 0600 08/01/22 0403  Weight: 66.8 kg 59.8 kg 67.5 kg    Intake/Output:   Intake/Output Summary (Last 24 hours) at 08/03/2022 0658 Last data filed at 08/03/2022 0343 Gross per 24 hour  Intake 10 ml  Output 1150 ml  Net -1140 ml      Physical Exam   General:  Fatigued appearing. Sitting up in bed. HEENT: normal Neck: supple. no JVD. Carotids 2+ bilat; no bruits. No lymphadenopathy or thryomegaly appreciated. Cor: PMI nondisplaced. Regular rate & rhythm. No rubs, gallops or murmurs. Lungs: clear Abdomen: soft, nontender, nondistended.  Extremities: no cyanosis, clubbing, rash, edema, LUE PICC Neuro: alert & orientedx3, cranial nerves grossly intact. moves all 4 extremities w/o difficulty.     Telemetry   Sinus brady upper 40s-50s  Labs    CBC No results for input(s): "WBC", "NEUTROABS", "HGB", "HCT", "MCV", "PLT" in the last 72 hours.  Basic Metabolic Panel Recent Labs    08/02/22 0530 08/03/22 0345   NA 135 136  K 3.9 4.3  CL 88* 94*  CO2 35* 34*  GLUCOSE 105* 98  BUN 23 23  CREATININE 1.06 1.15  CALCIUM 9.2 9.0  MG 2.3 2.3   Liver Function Tests No results for input(s): "AST", "ALT", "ALKPHOS", "BILITOT", "PROT", "ALBUMIN" in the last 72 hours.  BNP: BNP (last 3 results) Recent Labs    06/25/22 1829  BNP 436.2*     Imaging  reviewed  Medications:     Scheduled Medications:  amiodarone  200 mg Oral BID   apixaban  5 mg Oral BID   aspirin EC  81 mg Oral Daily   atorvastatin  80 mg Oral Daily   Chlorhexidine Gluconate Cloth  6 each Topical Daily   metoCLOPramide (REGLAN) injection  10 mg Intravenous Q8H   pantoprazole  40 mg Oral Daily   potassium chloride  40 mEq Oral BID   sertraline  25 mg Oral Daily   sodium chloride flush  10-40 mL Intracatheter Q12H   sodium chloride flush  3 mL Intravenous Q12H   spironolactone  12.5 mg Oral Daily   tamsulosin  0.4 mg Oral BID   torsemide  20 mg Oral Daily    Infusions:  sodium chloride Stopped (07/22/22 1203)   promethazine (PHENERGAN) injection (IM or IVPB)     sodium chloride      PRN Medications: sodium chloride, acetaminophen, bisacodyl, diazepam, hydrocortisone, hydrocortisone, melatonin,  ondansetron (ZOFRAN) IV, mouth rinse, polyethylene glycol, promethazine (PHENERGAN) injection (IM or IVPB), senna-docusate, sodium chloride flush, sodium chloride flush, traMADol    Patient Profile  66 y/o male recently found to have severe LV dysfunction in setting of markedly dilated aortic root (67 mm) and severe AI (tricuspid AoV) and single vessel CAD, now s/p Bentall and CABG x 1 w/ SVG-RCA. AHF team consulted to assist w/ post-operative management of systolic heart failure.     Assessment/Plan   Ascending Aortic Aneurysm/ Severe AI  - CTA  w/ severe dilation at the sinus of Valsalva, 67 mm. Moderate dilation of mid ascending aorta, 44 mm. Tricuspid AoV - s/p Bentall with bioprosthetic aortic valve 9/27 -  intraoperative TEE, no perivalvular leak post repair  - Echo 10/10 EF 25% RV function improving. Moderate to severe MR (in setting of LBBB) AVR ok   - CTA 10/16 negative for dissection.   2. CAD - single vessel, 60-70% pRCA  - s/p CABG x 1, SVG- RCA  - ASA + statin  - No BB for now w/ bradycardia     3. Chronic Systolic Heart Failure  - newly diagnosed; Echo 9/23 EF 25-30%, LV severely dilated, severe AI, Global HK, RV ok  - NICM, severity out of proportion to degree of CAD  - likely valvular CM in setting of severe AI +/- LBBB CM  - now post op bioprosthetic AVR + CABG. Intraoperative TEE LVEF 20-25%, RV ok - With junctional rhythm, will hold off on beta blocker and digoxin for now to avoid worsening A-V conduction. Eventual CRT-D (see below).  - Hypotensive 10/04 in setting of urinary retention.  - Echo 10/9- EF 25% RV function improving. Moderate to severe MR (in setting of LBBB) AVR ok   - More fatigued last couple of days. ? Elevated BP. RN planning to check BP in arm, have been obtaining measurements from leg. Check CO-OX and CBC. CVP 4-5. Cut back Torsemide to 20 mg every other day - GDMT had been limited by hypotension.  - Continue spiro 12.5 mg daily - Now off midodrine.  - would avoid SGLT2i given urinary retention/foley   4. Junctional rhythm - Resolved  - Has epicardial LV lead. EP following. Had been considering CRT-D prior to discharge but given need for DC-CV and prolonged hospitalization likely better wait at least several weeks until he recovers   5. LBBB -Will eventually need epicardial lead hooked up for CRT see discussion above   6. Anemia - Post-op, 1 u RBCs 10/04. - Last CBC 10/17. Check today.  7. Thrombocytopenia - Resolved.  8. Atrial fib/flutter, post-op - s/p DCCV 10/12>>NSR  - Back in A fib 10/16 - Maintaining SR--rate upper 40s-50s - Continue amiodarone 200 mg twice a day, may need to cut back to 200 daily at discharge d/t bradycardia.  -  Eliquis 5 mg bid   9. Urinary retention/UTI  - Followed by [facility] every 6 months and has been on flomax for some time.  - Recurrent bladder distention. CT abd--> 1600 cc urine.  Foley replaced  with 1300 cc out.  - UA +. Completed course of Rocephin - Continues w/ foley.    10. Post-op ileus/ Constipation  - 10/9 KUB c/w ileus  - Resolved  11. Hypokalemia/Hypomagnesemia  - Resolved - Keep K > 4 and Mag > 2  12. Hyponatremia - Resolved  13. Back Pain  - CTA 10/16 negative for dissection  - suspect 2/2 bladder distention  14. RUE Pain/ PICC Line Associated DVT  -US + for Acute nonocclusive DVT surrounding the picc line in the subclavian vein - PICC replaced -Already on Eliquis for Afib  15. Situational Depression  - started sertraline 10/18     Continue to ambulate. PT recommending HH PT. TOC following. Worry he needs more support, his mother is elderly and cares for him. May be more appropriate for SNF.    ADDENDUM 10:53 am:  Called to bedside to assess patient. Reports substernal chest pain, nausea and diaphoresis for about 1 hr.   BP LUE 123/79 mmHg. Rhythm sinus brady.   CVP 4-5 earlier this am, 7 on reassessment.  CO-OX and CBC from this am still pending.  Discussed with Dr. Haroldine Laws. Will give 500 cc bolus LR. Cycle troponin X 2. Check limited echo.    Length of Stay: 26   FINCH, LINDSAY N, PA-C 6:58 AM   Patient seen and examined with the above-signed Advanced Practice Provider and/or Housestaff. I personally reviewed laboratory data, imaging studies and relevant notes. I independently examined the patient and formulated the important aspects of the plan. I have edited the note to reflect any of my changes or salient points. I have personally discussed the plan with the patient and/or family.  Felt weak and lightheaded this am with CP. Likely over diuresed. Improved with 500cc LR.   Repeat echo EF 20% RV moderately reduced. Moderate to  severe PI. AVR ok   Hs trop ok    On tele in/out NSR and brady   General:  Weak appearing. No resp difficulty HEENT: normal Neck: supple. no JVD. Carotids 2+ bilat; no bruits. No lymphadenopathy or thryomegaly appreciated. Cor: PMI nondisplaced. Regular rate & rhythm. No rubs, gallops or murmurs. Lungs: clear Abdomen: soft, nontender, nondistended. No hepatosplenomegaly. No bruits or masses. Good bowel sounds. Extremities: no cyanosis, clubbing, rash, edema Neuro: alert & orientedx3, cranial nerves grossly intact. moves all 4 extremities w/o difficulty. Affect pleasant  He continues to be tenuous. I think he will need inpatient rehab or SNF. I reached out to PT and CIR coordinator to discuss candidacy.   Stop diuretics. Cut amio to 200 daily.   Glori Bickers, MD  6:03 PM

## 2022-08-04 DIAGNOSIS — Z95828 Presence of other vascular implants and grafts: Secondary | ICD-10-CM | POA: Diagnosis not present

## 2022-08-04 LAB — BASIC METABOLIC PANEL
Anion gap: 9 (ref 5–15)
BUN: 18 mg/dL (ref 8–23)
CO2: 33 mmol/L — ABNORMAL HIGH (ref 22–32)
Calcium: 9.1 mg/dL (ref 8.9–10.3)
Chloride: 94 mmol/L — ABNORMAL LOW (ref 98–111)
Creatinine, Ser: 0.75 mg/dL (ref 0.61–1.24)
GFR, Estimated: >60 mL/min (ref >60)
Glucose, Bld: 104 mg/dL — ABNORMAL HIGH (ref 70–99)
Potassium: 4 mmol/L (ref 3.5–5.1)
Sodium: 136 mmol/L (ref 135–145)

## 2022-08-04 LAB — COOXEMETRY PANEL
Carboxyhemoglobin: 2 % — ABNORMAL HIGH (ref 0.5–1.5)
Methemoglobin: 0.8 % (ref 0.0–1.5)
O2 Saturation: 70.3 %
Total hemoglobin: 10.3 g/dL — ABNORMAL LOW (ref 12.0–16.0)

## 2022-08-04 LAB — MAGNESIUM: Magnesium: 2.3 mg/dL (ref 1.7–2.4)

## 2022-08-04 MED ORDER — SENNOSIDES-DOCUSATE SODIUM 8.6-50 MG PO TABS
2.0000 | ORAL_TABLET | Freq: Every day | ORAL | Status: DC
  Administered 2022-08-04 – 2022-08-11 (×4): 2 via ORAL
  Filled 2022-08-04 (×6): qty 2

## 2022-08-04 MED ORDER — MEDIHONEY WOUND/BURN DRESSING EX PSTE
1.0000 | PASTE | Freq: Every day | CUTANEOUS | Status: DC
  Administered 2022-08-04 – 2022-08-12 (×9): 1 via TOPICAL
  Filled 2022-08-04 (×2): qty 44

## 2022-08-04 MED ORDER — POLYETHYLENE GLYCOL 3350 17 G PO PACK
17.0000 g | PACK | Freq: Every day | ORAL | Status: DC
  Administered 2022-08-04 – 2022-08-10 (×3): 17 g via ORAL
  Filled 2022-08-04 (×6): qty 1

## 2022-08-04 MED ORDER — LOSARTAN POTASSIUM 25 MG PO TABS
12.5000 mg | ORAL_TABLET | Freq: Every day | ORAL | Status: DC
  Administered 2022-08-04 – 2022-08-05 (×2): 12.5 mg via ORAL
  Filled 2022-08-04 (×2): qty 1

## 2022-08-04 NOTE — Evaluation (Signed)
Occupational Therapy Evaluation Patient Details Name: Joe [name] MRN: [id number] DOB: [birth year] Today's Date: 08/04/2022   History of Present Illness 66 yo male admitted 07/08/22 for ascending aortic arch replacement with Bentall procedure and CABGx1 same date. Extubated 9/28. Ileus, Afib and CHF post procedure. Hosptial stay continue to be complicated by back pain and UTI. PMHx: congenital brain damage, speech delay, bil THA, PLIF, Lt TSA, PVC   Clinical Impression   Joe [name] was evaluated s/p the above admission list, he is indep with ADLs at baseline and is very active, he lives with his mom who provides cognitive assist as needed and completes IADLs. Upon evaluation pt was limited by knowledge of sternal precautions, generalized weakness, decreased activity tolerance and baseline cognitive deficits. Overall he required up to min A for bed mobility, transfers and room ambulation with RW. Cues for sternal precautions throughout. Due to functional deficits listed below, he requires min A for UB ADLs and up to max A for LB ADLs. OT to continue to follow acutely. Recommend d/c to AIR for maximal functional progress towards indep baseline prior to d/c home.    Recommendations for follow up therapy are one component of a multi-disciplinary discharge planning process, led by the attending physician.  Recommendations may be updated based on patient status, additional functional criteria and insurance authorization.   Follow Up Recommendations  Acute inpatient rehab (3hours/day)    Assistance Recommended at Discharge Frequent or constant Supervision/Assistance  Patient can return home with the following A little help with walking and/or transfers;A lot of help with bathing/dressing/bathroom;Assistance with cooking/housework;Direct supervision/assist for medications management;Direct supervision/assist for financial management;Help with stairs or ramp for entrance;Assist for transportation     Functional Status Assessment  Patient has had a recent decline in their functional status and demonstrates the ability to make significant improvements in function in a reasonable and predictable amount of time.  Equipment Recommendations  Other (comment) (defer)    Recommendations for Other Services Rehab consult     Precautions / Restrictions Precautions Precautions: Sternal;Fall Precaution Comments: frequent cueing for precautions Restrictions Weight Bearing Restrictions: No Other Position/Activity Restrictions: sternal precautions      Mobility Bed Mobility Overal bed mobility: Needs Assistance Bed Mobility: Rolling, Sidelying to Sit Rolling: Min guard Sidelying to sit: Min assist       General bed mobility comments: cues for sequencing    Transfers Overall transfer level: Needs assistance Equipment used: Rolling walker (2 wheels) Transfers: Sit to/from Stand Sit to Stand: Min guard, From elevated surface                  Balance Overall balance assessment: Needs assistance Sitting-balance support: Feet supported, No upper extremity supported Sitting balance-Leahy Scale: Fair     Standing balance support: Bilateral upper extremity supported, Reliant on assistive device for balance Standing balance-Leahy Scale: Poor                             ADL either performed or assessed with clinical judgement   ADL Overall ADL's : Needs assistance/impaired Eating/Feeding: Set up;Sitting   Grooming: Min guard;Standing   Upper Body Bathing: Minimal assistance;Sitting   Lower Body Bathing: Moderate assistance;Sit to/from stand   Upper Body Dressing : Minimal assistance;Sitting   Lower Body Dressing: Maximal assistance;Sit to/from stand   Toilet Transfer: Min guard;Ambulation   Toileting- Clothing Manipulation and Hygiene: Maximal assistance;Sit to/from stand Toileting - Clothing Manipulation Details (indicate cue type and reason):  due to peri  wounds     Functional mobility during ADLs: Min guard;Rolling walker (2 wheels) General ADL Comments: cues for initiation, sequencing and problem solving. sternal precuations     Vision Baseline Vision/History: 0 No visual deficits Ability to See in Adequate Light: 0 Adequate Vision Assessment?: No apparent visual deficits            Pertinent Vitals/Pain Pain Assessment Pain Assessment: Faces Faces Pain Scale: Hurts little more Pain Location: rear peri Pain Descriptors / Indicators: Grimacing, Discomfort Pain Intervention(s): Monitored during session     Hand Dominance Right   Extremity/Trunk Assessment Upper Extremity Assessment Upper Extremity Assessment: Generalized weakness   Lower Extremity Assessment Lower Extremity Assessment: Defer to PT evaluation   Cervical / Trunk Assessment Cervical / Trunk Assessment: Kyphotic   Communication Communication Communication: No difficulties (slow, deliberate speech at baseline)   Cognition Arousal/Alertness: Awake/alert Behavior During Therapy: Flat affect Overall Cognitive Status: History of cognitive impairments - at baseline Area of Impairment: Attention                   Current Attention Level: Sustained Memory: Decreased recall of precautions Following Commands: Follows one step commands consistently, Follows multi-step commands inconsistently, Follows one step commands with increased time Safety/Judgement: Decreased awareness of safety     General Comments: flat affect, responds to yes/no.     General Comments  VSS on RA            Home Living Family/patient expects to be discharged to:: Private residence Living Arrangements: Parent Available Help at Discharge: Family;Available 24 hours/day Type of Home: House Home Access: Stairs to enter [employer] of Steps: 1 Entrance Stairs-Rails: None Home Layout: Two level;Able to live on main level with bedroom/bathroom;Full bath on main  level Alternate Level Stairs-Number of Steps: 12 can stay downstairs Alternate Level Stairs-Rails: Right Bathroom Shower/Tub: Occupational psychologist: Standard Bathroom Accessibility: Yes How Accessible: Accessible via walker Home Equipment: Rolling Walker (2 wheels);BSC/3in1          Prior Functioning/Environment Prior Level of Function : Independent/Modified Independent             Mobility Comments: pt previously riding bicycle and going to gym 3x a week ADLs Comments: pt baseline is assistance from mother for IADLS        OT Problem List: Decreased range of motion;Decreased strength;Impaired balance (sitting and/or standing);Decreased activity tolerance;Decreased safety awareness;Decreased knowledge of use of DME or AE;Decreased knowledge of precautions;Cardiopulmonary status limiting activity;Pain      OT Treatment/Interventions: Self-care/ADL training;Therapeutic exercise;DME and/or AE instruction;Therapeutic activities;Patient/family education;Balance training    OT Goals(Current goals can be found in the care plan section) Acute Rehab OT Goals Patient Stated Goal: go to rehab OT Goal Formulation: With patient Time For Goal Achievement: 08/18/22 Potential to Achieve Goals: Good ADL Goals Pt Will Perform Grooming: with modified independence;standing Pt Will Perform Lower Body Dressing: with modified independence;sit to/from stand Pt Will Transfer to Toilet: with modified independence;ambulating Additional ADL Goal #1: Pt will indep maintain sternal precuations throughout session  OT Frequency: Min 2X/week       AM-PAC OT "6 Clicks" Daily Activity     Outcome Measure Help from another person eating meals?: A Little Help from another person taking care of personal grooming?: A Little Help from another person toileting, which includes using toliet, bedpan, or urinal?: A Lot Help from another person bathing (including washing, rinsing, drying)?: A  Lot Help from another person to  put on and taking off regular upper body clothing?: A Little Help from another person to put on and taking off regular lower body clothing?: A Lot 6 Click Score: 15   End of Session Equipment Utilized During Treatment: Gait belt;Rolling walker (2 wheels) Nurse Communication: Mobility status  Activity Tolerance: Patient tolerated treatment well Patient left: in chair;with call bell/phone within reach  OT Visit Diagnosis: Unsteadiness on feet (R26.81);Repeated falls (R29.6);Muscle weakness (generalized) (M62.81);Pain                Time: 1212-1229 OT Time Calculation (min): 17 min Charges:  OT General Charges $OT Visit: 1 Visit OT Evaluation $OT Eval Moderate Complexity: 1 Mod   Christopherjohn Schiele D Causey 08/04/2022, 12:51 PM

## 2022-08-04 NOTE — Progress Notes (Signed)
PT Cancellation Note  Patient Details Name: Joe [name] MRN: [id number] DOB: 11/25/1955   Cancelled Treatment:    Reason Eval/Treat Not Completed: (P) Patient at procedure or test/unavailable;Other (comment) (RN present chainging pt wound dressings, re-attempted and pt declining mobility secondary to fatigue. Will follow up tomorrow o continue PT POC.)  Audry Riles. PTA Acute Rehabilitation Services Office: [place] 08/04/2022, 4:22 PM

## 2022-08-04 NOTE — Consult Note (Addendum)
[facility] Nurse re-consult Note: Reason for Consult: Refer to initial [place] consult on 10/20;  patient was noted to have a Stage 2 pressure injury to the gluteal cleft.  This has now evolved into full thickness tissue loss surrounded by dark purple Deep tissue pressure injury in a butterfly shape across bilat upper buttocks/coccyx. 50% dark purple/50% black/yellow Wound type: Pt had a prolonged surgical procedure prior to this wound manifesting into a DTPI evolving to Unstageable. Family member at the bedside to assess wound and discuss plan of care. Pt plans to transfer to rehab soon. Pressure Injury POA: Yes Dressing procedure/placement/frequency: Topical treatment orders provided for bedside nurses to perform to assist with removal of nonviable tissue as follows: Apply Medihoney to bilat buttocks/sacrum Q day, then cover with foam dressing. (Change foam dressing Q 3 days or PRN soiling.) Please re-consult if further assistance is needed.  Thank-you,  Julien Girt MSN, [place], [place], [place], [place]

## 2022-08-04 NOTE — Progress Notes (Signed)
  Inpatient Rehabilitation Admissions Coordinator   Met with patient and Mom at bedside for rehab assessment. We discussed goals and expectations of a possible CIR admit. They prefer CIR for rehab. Family can provide expected caregiver support that is recommended . I have placed order for OT eval and await that eval to begin insurance  Auth with [facility] for possible CIR admit pending approval. Please call me with any questions.   Danne Baxter, RN, MSN Rehab Admissions Coordinator [phone]

## 2022-08-04 NOTE — Progress Notes (Signed)
      [address]       [place] [address]             [phone]      12 Days Post-Op Procedure(s) (LRB): TRANSESOPHAGEAL ECHOCARDIOGRAM (TEE) (N/A) CARDIOVERSION (N/A) Subjective: Patient states he feels nauseous but it has improved since yesterday. +BM 10/22  Objective: Vital signs in last 24 hours: Temp:  [97.6 F (36.4 C)-98.8 F (37.1 C)] 97.7 F (36.5 C) (10/24 0412) Pulse Rate:  [49-55] 50 (10/24 0412) Cardiac Rhythm: Bundle branch block;Heart block (10/23 1921) Resp:  [16-20] 17 (10/24 0412) BP: (123-152)/(67-95) 145/76 (10/24 0412) SpO2:  [93 %-96 %] 93 % (10/24 0412) Weight:  [67.5 kg] 67.5 kg (10/24 0500)  Hemodynamic parameters for last 24 hours: CVP:  [2 mmHg-9 mmHg] 3 mmHg  Intake/Output from previous day: 10/23 0701 - 10/24 0700 In: -  Out: 1400 [Urine:1400] Intake/Output this shift: No intake/output data recorded.  General appearance: alert, cooperative, and no distress Neurologic: intact Heart: NSR-SB, no murmurs, rubs, gallops Lungs: clear to auscultation bilaterally Abdomen: soft, non-tender; bowel sounds normal; no masses,  no organomegaly Extremities: extremities normal, atraumatic, no cyanosis or edema Wound: Clean, dry, intact. Stage 2 sacral wound, clean dressing  Lab Results: Recent Labs    08/03/22 1110  WBC 7.1  HGB 9.5*  HCT 28.4*  PLT 321   BMET:  Recent Labs    08/03/22 0345 08/04/22 0554  NA 136 136  K 4.3 4.0  CL 94* 94*  CO2 34* 33*  GLUCOSE 98 104*  BUN 23 18  CREATININE 1.15 0.75  CALCIUM 9.0 9.1    PT/INR: No results for input(s): "LABPROT", "INR" in the last 72 hours. ABG    Component Value Date/Time   PHART 7.342 (L) 07/10/2022 0546   HCO3 23.4 07/10/2022 0546   TCO2 30 07/16/2022 0711   ACIDBASEDEF 2.0 07/10/2022 0546   O2SAT 68.1 08/03/2022 1030   CBG (last 3)  No results for input(s): "GLUCAP" in the last 72 hours.  Assessment/Plan: S/P Procedure(s) (LRB): TRANSESOPHAGEAL  ECHOCARDIOGRAM (TEE) (N/A) CARDIOVERSION (N/A)  CV: NSR-SB, rate 50. SBP 145. On amiodarone '200mg'$  BID. Chronic systolic HF, AHF following and optimizing CHF with GDMT. Echo yesterday showed EF 20%.  Pulm: Saturating 93% on RA. Continue IS and ambulation GI: Some nausea, no abdominal tenderness or distension. +BM 10/22.  Renal: Good UO. Cr much improved 0.75. Hyponatremia resolved. Foley catheter in place. On flomax for urinary retention. RUE DVT: Swelling improved, on Eliquis. ID: H/H 9.5/28.4 Wounds: Reached out to wound care nurse to recheck sacral wound. Continue dressing changes.  Dispo: Possible CIR candidate, awaiting approval.     LOS: 27 days    Magdalene River, PA-C 08/04/2022

## 2022-08-04 NOTE — Progress Notes (Addendum)
Patient ID: Joe [name], male   DOB: 11/29/1955, 66 y.o.   MRN: [id number]     Advanced Heart Failure Rounding Note  PCP-Cardiologist: Skeet Latch, MD   Subjective:    9/27 s/p Bentall with bioprosthetic aortic valve . Post-op course c/b ileus.   S/p DCCV 10/12--> NSR/SB  BP elevated last few days but measurements taken from leg. 152/77 (98), personally checked. Off midodrine. Dizzy when sitting up in bed, then resolves.  Ambulated some yesterday. Doing ok, waiting for breakfast in bed. Denies pain, CP and SOB.   Had BM 10/22.  CVP 6-7. Co-ox 68% yesterday, pending today   Objective:   Weight Range: 67.5 kg Body mass index is 23.31 kg/m.   Vital Signs:   Temp:  [97.6 F (36.4 C)-98.8 F (37.1 C)] 97.8 F (36.6 C) (10/24 0807) Pulse Rate:  [49-55] 54 (10/24 0807) Resp:  [16-20] 20 (10/24 0807) BP: (123-149)/(67-95) 149/73 (10/24 0807) SpO2:  [93 %-95 %] 93 % (10/24 0807) Weight:  [67.5 kg] 67.5 kg (10/24 0500) Last BM Date : 08/02/22  Weight change: Filed Weights   07/31/22 0600 08/01/22 0403 08/04/22 0500  Weight: 59.8 kg 67.5 kg 67.5 kg    Intake/Output:   Intake/Output Summary (Last 24 hours) at 08/04/2022 0838 Last data filed at 08/04/2022 0412 Gross per 24 hour  Intake --  Output 1400 ml  Net -1400 ml      Physical Exam  CVP 6-7 General:  well appearing.  No respiratory difficulty HEENT: normal Neck: supple. JVD ~6 cm. Carotids 2+ bilat; no bruits. No lymphadenopathy or thyromegaly appreciated. Cor: PMI nondisplaced. Regular rate & rhythm. No rubs, gallops or murmurs. Lungs: clear, diminished bases Abdomen: soft, nontender, nondistended. No hepatosplenomegaly. No bruits or masses. Active bowel sounds all four quadrants. Extremities: no cyanosis, clubbing, rash, edema. + ted hoses, PICC LUE Neuro: alert & oriented x 3, cranial nerves grossly intact. moves all 4 extremities w/o difficulty. Affect pleasant.   Telemetry   Sinus brady upper  40s-50s (Personally reviewed)    Labs    CBC Recent Labs    08/03/22 1110  WBC 7.1  HGB 9.5*  HCT 28.4*  MCV 99.3  PLT 500    Basic Metabolic Panel Recent Labs    08/03/22 0345 08/04/22 0554  NA 136 136  K 4.3 4.0  CL 94* 94*  CO2 34* 33*  GLUCOSE 98 104*  BUN 23 18  CREATININE 1.15 0.75  CALCIUM 9.0 9.1  MG 2.3 2.3   Liver Function Tests No results for input(s): "AST", "ALT", "ALKPHOS", "BILITOT", "PROT", "ALBUMIN" in the last 72 hours.  BNP: BNP (last 3 results) Recent Labs    06/25/22 1829  BNP 436.2*     Imaging  reviewed  Medications:     Scheduled Medications:  amiodarone  200 mg Oral Daily   apixaban  5 mg Oral BID   aspirin EC  81 mg Oral Daily   atorvastatin  80 mg Oral Daily   Chlorhexidine Gluconate Cloth  6 each Topical Daily   metoCLOPramide (REGLAN) injection  10 mg Intravenous Q8H   pantoprazole  40 mg Oral Daily   potassium chloride  40 mEq Oral Daily   sertraline  25 mg Oral Daily   sodium chloride flush  10-40 mL Intracatheter Q12H   sodium chloride flush  3 mL Intravenous Q12H   spironolactone  12.5 mg Oral Daily   tamsulosin  0.4 mg Oral BID    Infusions:  sodium chloride  Stopped (07/22/22 1203)   promethazine (PHENERGAN) injection (IM or IVPB)     sodium chloride      PRN Medications: sodium chloride, acetaminophen, bisacodyl, diazepam, hydrocortisone, hydrocortisone, melatonin, ondansetron (ZOFRAN) IV, mouth rinse, polyethylene glycol, promethazine (PHENERGAN) injection (IM or IVPB), senna-docusate, sodium chloride flush, sodium chloride flush, traMADol    Patient Profile  66 y/o male recently found to have severe LV dysfunction in setting of markedly dilated aortic root (67 mm) and severe AI (tricuspid AoV) and single vessel CAD, now s/p Bentall and CABG x 1 w/ SVG-RCA. AHF team consulted to assist w/ post-operative management of systolic heart failure.     Assessment/Plan   Ascending Aortic Aneurysm/ Severe AI   - CTA  w/ severe dilation at the sinus of Valsalva, 67 mm. Moderate dilation of mid ascending aorta, 44 mm. Tricuspid AoV - s/p Bentall with bioprosthetic aortic valve 9/27 - intraoperative TEE, no perivalvular leak post repair  - Echo 10/10 EF 25% RV function improving. Moderate to severe MR (in setting of LBBB) AVR ok   - Echo 10/23 EF 20% RV moderately reduced. Moderate to severe PI. AVR ok  - CTA 10/16 negative for dissection.   2. CAD - single vessel, 60-70% pRCA  - s/p CABG x 1, SVG- RCA  - ASA + statin  - No BB for now w/ bradycardia     3. Chronic Systolic Heart Failure  - newly diagnosed; Echo 9/23 EF 25-30%, LV severely dilated, severe AI, Global HK, RV ok  - NICM, severity out of proportion to degree of CAD  - likely valvular CM in setting of severe AI +/- LBBB CM  - now post op bioprosthetic AVR + CABG. Intraoperative TEE LVEF 20-25%, RV ok - With junctional rhythm, will hold off on beta blocker and digoxin for now to avoid worsening A-V conduction. Eventual CRT-D (see below).  - Hypotensive 10/04 in setting of urinary retention.  - Echo 10/9- EF 25% RV function improving. Moderate to severe MR (in setting of LBBB) AVR ok   - Echo 10/23-  EF 20% RV moderately reduced. Moderate to severe PI. AVR ok  - More fatigued last couple of days.? Elevated BP. Unable to get BP in arm 2/2 previous DVT and PICC in other arm. Co-ox yesterday 68, today pending, Hgb stable 9.5. CVP 6-7. Diuretics held, got 562m bolus yesterday, previously on torsemide every other day, no diuretics today, can possibly restart tomorrow.  - GDMT had been limited by hypotension, now hypertensive - Start 12.5 losartan daily, monitor for hypotension, encourage slow position changes to avoid postural hypotension - Continue spiro 12.5 mg daily - Now off midodrine.  - would avoid SGLT2i given urinary retention/foley   4. Junctional rhythm - Resolved  - Has epicardial LV lead. EP following. Had been considering  CRT-D prior to discharge but given need for DC-CV and prolonged hospitalization likely better wait at least several weeks until he recovers   5. LBBB -Will eventually need epicardial lead hooked up for CRT see discussion above   6. Anemia - Post-op, 1 u RBCs 10/04. - Last CBC 10/17. Hgb 9.5  7. Thrombocytopenia - Resolved.  8. Atrial fib/flutter, post-op - s/p DCCV 10/12>>NSR  - Back in A fib 10/16 - Maintaining SR--rate upper 40s-50s - Amiodarone decreased to 200 mg daily yesterday - Eliquis 5 mg bid   9. Urinary retention/UTI  - Followed by [facility] every 6 months and has been on flomax for some time.  - Recurrent  bladder distention. CT abd--> 1600 cc urine.  Foley replaced with 1300 cc out.  - UA +. Completed course of Rocephin - Continues w/ foley.    10. Post-op ileus/ Constipation  - 10/9 KUB c/w ileus  - Resolved  11. Hypokalemia/Hypomagnesemia  - Resolved - Keep K > 4 and Mag > 2  12. Hyponatremia - Resolved  13. Back Pain  - CTA 10/16 negative for dissection  - suspect 2/2 bladder distention   14. RUE Pain/ PICC Line Associated DVT  - [place] + for Acute nonocclusive DVT surrounding the picc line in the subclavian vein - PICC replaced - Already on Eliquis for Afib  15. Situational Depression  - started sertraline 10/18   Encouraged to ambulate. PT recommending HH PT. TOC following. Worry he needs more support, his mother is elderly and cares for him. Plan now to go to CIR.    Length of Stay: [place] AGACNP-BC 8:38 AM  Patient seen and examined with the above-signed Advanced Practice Provider and/or Housestaff. I personally reviewed laboratory data, imaging studies and relevant notes. I independently examined the patient and formulated the important aspects of the plan. I have edited the note to reflect any of my changes or salient points. I have personally discussed the plan with the patient and/or family.   Continues to feel weak.  Constipated. Got IVF yesterday. Diuretics held. Scr improved. No longer dizzy. On Eliquis for AF and RUE DVT. Remains in AF on monitor  General:  Weak appearing. No resp difficulty HEENT: normal Neck: supple. JVP 7-8. Carotids 2+ bilat; no bruits. No lymphadenopathy or thryomegaly appreciated. Cor: PMI nondisplaced. Regular rate & rhythm. No rubs, gallops or murmurs. Lungs: clear Abdomen: soft, nontender, + mildly distended. No hepatosplenomegaly. No bruits or masses. Good bowel sounds. Extremities: no cyanosis, clubbing, rash, edema  Mild swelling RUE    Neuro: alert & orientedx3, cranial nerves grossly intact. moves all 4 extremities w/o difficulty. Affect pleasant + Foley   Improving slowly but still very weak. BP up. Continue to hold diuretics one more day. Start low-dose losartan. D/w PT. Ideally will qualify for CIR.   Glori Bickers, MD  10:14 AM

## 2022-08-04 NOTE — Plan of Care (Signed)
  Problem: Activity: Goal: Ability to return to baseline activity level will improve Outcome: Progressing   Problem: Cardiovascular: Goal: Ability to achieve and maintain adequate cardiovascular perfusion will improve Outcome: Progressing   Problem: Education: Goal: Knowledge of General Education information will improve Description: Including pain rating scale, medication(s)/side effects and non-pharmacologic comfort measures Outcome: Progressing   Problem: Health Behavior/Discharge Planning: Goal: Ability to manage health-related needs will improve Outcome: Progressing

## 2022-08-05 DIAGNOSIS — Z95828 Presence of other vascular implants and grafts: Secondary | ICD-10-CM | POA: Diagnosis not present

## 2022-08-05 LAB — BASIC METABOLIC PANEL
Anion gap: 7 (ref 5–15)
BUN: 17 mg/dL (ref 8–23)
CO2: 32 mmol/L (ref 22–32)
Calcium: 9.3 mg/dL (ref 8.9–10.3)
Chloride: 99 mmol/L (ref 98–111)
Creatinine, Ser: 0.83 mg/dL (ref 0.61–1.24)
GFR, Estimated: >60 mL/min (ref >60)
Glucose, Bld: 96 mg/dL (ref 70–99)
Potassium: 4 mmol/L (ref 3.5–5.1)
Sodium: 138 mmol/L (ref 135–145)

## 2022-08-05 LAB — MAGNESIUM: Magnesium: 2 mg/dL (ref 1.7–2.4)

## 2022-08-05 NOTE — Progress Notes (Signed)
Physical Therapy Treatment Patient Details Name: Joe [name] MRN: [id number] DOB: Jun 18, 1956 Today's Date: 08/05/2022   History of Present Illness 66 yo male admitted 07/08/22 for ascending aortic arch replacement with Bentall procedure and CABGx1 same date. Extubated 9/28. Ileus, Afib and CHF post procedure. Hosptial stay continue to be complicated by back pain and UTI. PMHx: congenital brain damage, speech delay, bil THA, PLIF, Lt TSA, PVC    PT Comments     Pt is progressing well towards his physical therapy goals, demonstrating improved activity tolerance and ambulation distance. Pt ambulating 100 ft with a RW at a min guard assist level, HR 54-72, SpO2 97% on RA. Needs cueing for sternal precautions throughout. Currently requiring more assist with sit > sidelying and will benefit from continued practice to decrease caregiver burden. Updated d/c plan to HHPT in light of CIR denial and pt progression.    Recommendations for follow up therapy are one component of a multi-disciplinary discharge planning process, led by the attending physician.  Recommendations may be updated based on patient status, additional functional criteria and insurance authorization.  Follow Up Recommendations  Home health PT (denied from CIR)     Assistance Recommended at Discharge Frequent or constant Supervision/Assistance  Patient can return home with the following A little help with walking and/or transfers;A little help with bathing/dressing/bathroom;Help with stairs or ramp for entrance;Assist for transportation;Assistance with feeding   Equipment Recommendations  None recommended by PT    Recommendations for Other Services       Precautions / Restrictions Precautions Precautions: Sternal;Fall Restrictions Weight Bearing Restrictions: Yes (sternal precautions)     Mobility  Bed Mobility Overal bed mobility: Needs Assistance Bed Mobility: Rolling, Sidelying to Sit, Sit to Sidelying Rolling:  Modified independent (Device/Increase time) Sidelying to sit: Supervision     Sit to sidelying: Mod assist General bed mobility comments: no physical assist for sidelying <> sit, modA for BLE assist back into bed    Transfers Overall transfer level: Needs assistance Equipment used: Rolling walker (2 wheels) Transfers: Sit to/from Stand Sit to Stand: Min guard           General transfer comment: cues for placing hands on knees    Ambulation/Gait Ambulation/Gait assistance: Min guard Gait Distance (Feet): 100 Feet Assistive device: Rolling walker (2 wheels) Gait Pattern/deviations: Step-through pattern, Decreased stride length, Decreased dorsiflexion - left Gait velocity: decreased     General Gait Details: Decreased bilateral foot clearance, slow and steady pace, able to verbalize fatigue and when to turn around   [employer] Mobility    Modified Rankin (Stroke Patients Only)       Balance Overall balance assessment: Needs assistance Sitting-balance support: Feet supported, No upper extremity supported Sitting balance-Leahy Scale: Good     Standing balance support: Bilateral upper extremity supported, Reliant on assistive device for balance Standing balance-Leahy Scale: Poor                              Cognition Arousal/Alertness: Awake/alert Behavior During Therapy: Flat affect Overall Cognitive Status: History of cognitive impairments - at baseline                                 General Comments: Cues for sternal precautions        Exercises  General Comments        Pertinent Vitals/Pain Pain Assessment Pain Assessment: Faces Faces Pain Scale: No hurt    Home Living                          Prior Function            PT Goals (current goals can now be found in the care plan section) Acute Rehab PT Goals Patient Stated Goal: home Potential to Achieve Goals:  Good Progress towards PT goals: Progressing toward goals    Frequency    Min 3X/week      PT Plan Discharge plan needs to be updated    Co-evaluation              AM-PAC PT "6 Clicks" Mobility   Outcome Measure  Help needed turning from your back to your side while in a flat bed without using bedrails?: None Help needed moving from lying on your back to sitting on the side of a flat bed without using bedrails?: A Little Help needed moving to and from a bed to a chair (including a wheelchair)?: A Little Help needed standing up from a chair using your arms (e.g., wheelchair or bedside chair)?: A Little Help needed to walk in hospital room?: A Little Help needed climbing 3-5 steps with a railing? : A Little 6 Click Score: 19    End of Session Equipment Utilized During Treatment: Gait belt Activity Tolerance: Patient tolerated treatment well Patient left: in bed;with call bell/phone within reach;with bed alarm set Nurse Communication: Mobility status PT Visit Diagnosis: Other abnormalities of gait and mobility (R26.89);Muscle weakness (generalized) (M62.81);Unsteadiness on feet (R26.81)     Time: 8832-5498 PT Time Calculation (min) (ACUTE ONLY): 16 min  Charges:  $Therapeutic Activity: 8-22 mins                     Wyona Almas, PT, DPT Acute Rehabilitation Services Office [phone]    Joe [name] 08/05/2022, 5:07 PM

## 2022-08-05 NOTE — Progress Notes (Addendum)
      [address]       [place] [address]             [phone]      13 Days Post-Op Procedure(s) (LRB): TRANSESOPHAGEAL ECHOCARDIOGRAM (TEE) (N/A) CARDIOVERSION (N/A) Subjective: No new complaints today  Objective: Vital signs in last 24 hours: Temp:  [97.3 F (36.3 C)-98.8 F (37.1 C)] 97.3 F (36.3 C) (10/25 0821) Pulse Rate:  [52-65] 59 (10/25 0821) Cardiac Rhythm: Junctional rhythm (10/25 0700) Resp:  [14-18] 17 (10/25 0821) BP: (114-173)/(57-92) 117/57 (10/25 0821) SpO2:  [93 %-97 %] 95 % (10/25 0821)  Hemodynamic parameters for last 24 hours: CVP:  [4 mmHg-7 mmHg] 7 mmHg  Intake/Output from previous day: 10/24 0701 - 10/25 0700 In: -  Out: 1525 [Urine:1525] Intake/Output this shift: Total I/O In: 20 [I.V.:20] Out: -   General appearance: alert, cooperative, and no distress Neurologic: intact Heart: regular rate and rhythm, S1, S2 normal, no murmur, click, rub or gallop Lungs: clear to auscultation bilaterally Abdomen: soft, non-tender; bowel sounds normal; no masses,  no organomegaly Extremities: extremities normal, atraumatic, no cyanosis or edema Wound: Clean, dry, intact  Lab Results: Recent Labs    08/03/22 1110  WBC 7.1  HGB 9.5*  HCT 28.4*  PLT 321   BMET:  Recent Labs    08/04/22 0554 08/05/22 0500  NA 136 138  K 4.0 4.0  CL 94* 99  CO2 33* 32  GLUCOSE 104* 96  BUN 18 17  CREATININE 0.75 0.83  CALCIUM 9.1 9.3    PT/INR: No results for input(s): "LABPROT", "INR" in the last 72 hours. ABG    Component Value Date/Time   PHART 7.342 (L) 07/10/2022 0546   HCO3 23.4 07/10/2022 0546   TCO2 30 07/16/2022 0711   ACIDBASEDEF 2.0 07/10/2022 0546   O2SAT 70.3 08/04/2022 0922   CBG (last 3)  No results for input(s): "GLUCAP" in the last 72 hours.  Assessment/Plan: S/P Procedure(s) (LRB): TRANSESOPHAGEAL ECHOCARDIOGRAM (TEE) (N/A) CARDIOVERSION (N/A)  CV: NSR-SB, rate 52 this AM. SBP 114, on losartan 12.'5mg'$   and amiodarone '200mg'$  BID. Chronic systolic HF, AHF optimizing medications for CHF.  Pulm: Saturating 97% on RA. Walked some yesterday. Continue ambulation and IS.  GI: No BM since 10/22, no distension or tenderness. On stool softeners.  Renal: Good UO, foley catheter in place. Diuretics had been held per AHF, now started on spironolactone 12.'5mg'$ . Wounds: Sacral wound now unstageable full thickness. Continue dressing changes per wound care recommendations. Dispo: Awaiting CIR approval.   LOS: 28 days    Magdalene River, PA-C 08/05/2022  Agree with above Pt appears more bright today Still with buttocks issue Awaiting Inpt Rehab approval Diuretics per AHF

## 2022-08-05 NOTE — Progress Notes (Addendum)
Patient ID: Joe [name], male   DOB: 1956/05/15, 66 y.o.   MRN: [id number]     Advanced Heart Failure Rounding Note  PCP-Cardiologist: Skeet Latch, MD   Subjective:    9/27 s/p Bentall with bioprosthetic aortic valve . Post-op course c/b ileus.   S/p DCCV 10/12--> NSR/SB  BPs improved w/ losartan. SCr and K ok. Sinus brady, upper 50s.   Had BM 10/22.  CVP 4-5  Says he feels "ok". No dyspnea. No abdominal pain. Hasn't been out of bed yet today.   Objective:   Weight Range: 67.5 kg Body mass index is 23.31 kg/m.   Vital Signs:   Temp:  [97.3 F (36.3 C)-98.8 F (37.1 C)] 97.3 F (36.3 C) (10/25 0821) Pulse Rate:  [52-65] 59 (10/25 0821) Resp:  [14-18] 17 (10/25 0821) BP: (114-173)/(57-92) 117/57 (10/25 0821) SpO2:  [93 %-97 %] 95 % (10/25 0821) Last BM Date : 08/03/22  Weight change: Filed Weights   07/31/22 0600 08/01/22 0403 08/04/22 0500  Weight: 59.8 kg 67.5 kg 67.5 kg    Intake/Output:   Intake/Output Summary (Last 24 hours) at 08/05/2022 0830 Last data filed at 08/05/2022 0819 Gross per 24 hour  Intake 20 ml  Output 1525 ml  Net -1505 ml      Physical Exam   CVP 4-5  General:  fatigued appearing, laying in bed.  No respiratory difficulty HEENT: normal Neck: supple.  JVD not elevated.  Carotids 2+ bilat; no bruits. No lymphadenopathy or thyromegaly appreciated. Cor: PMI nondisplaced. Regular rate & rhythm. No rubs, gallops or murmurs. Lungs: CTAB  Abdomen: soft, nontender, nondistended. No hepatosplenomegaly. No bruits or masses. Active bowel sounds all four quadrants. Extremities: no cyanosis, clubbing, rash, edema. + ted hoses, PICC LUE Neuro: alert & oriented x 3, cranial nerves grossly intact. moves all 4 extremities w/o difficulty. Affect pleasant.   Telemetry   Sinus brady upper 50s (Personally reviewed)    Labs    CBC Recent Labs    08/03/22 1110  WBC 7.1  HGB 9.5*  HCT 28.4*  MCV 99.3  PLT 601    Basic Metabolic  Panel Recent Labs    08/04/22 0554 08/05/22 0500  NA 136 138  K 4.0 4.0  CL 94* 99  CO2 33* 32  GLUCOSE 104* 96  BUN 18 17  CREATININE 0.75 0.83  CALCIUM 9.1 9.3  MG 2.3 2.0   Liver Function Tests No results for input(s): "AST", "ALT", "ALKPHOS", "BILITOT", "PROT", "ALBUMIN" in the last 72 hours.  BNP: BNP (last 3 results) Recent Labs    06/25/22 1829  BNP 436.2*     Imaging  reviewed  Medications:     Scheduled Medications:  amiodarone  200 mg Oral Daily   apixaban  5 mg Oral BID   aspirin EC  81 mg Oral Daily   atorvastatin  80 mg Oral Daily   Chlorhexidine Gluconate Cloth  6 each Topical Daily   leptospermum manuka honey  1 Application Topical Daily   losartan  12.5 mg Oral Daily   metoCLOPramide (REGLAN) injection  10 mg Intravenous Q8H   pantoprazole  40 mg Oral Daily   polyethylene glycol  17 g Oral Daily   potassium chloride  40 mEq Oral Daily   senna-docusate  2 tablet Oral QHS   sertraline  25 mg Oral Daily   sodium chloride flush  10-40 mL Intracatheter Q12H   sodium chloride flush  3 mL Intravenous Q12H   spironolactone  12.5  mg Oral Daily   tamsulosin  0.4 mg Oral BID    Infusions:  sodium chloride Stopped (07/22/22 1203)   promethazine (PHENERGAN) injection (IM or IVPB)     sodium chloride      PRN Medications: sodium chloride, acetaminophen, bisacodyl, diazepam, hydrocortisone, hydrocortisone, melatonin, ondansetron (ZOFRAN) IV, mouth rinse, polyethylene glycol, promethazine (PHENERGAN) injection (IM or IVPB), senna-docusate, sodium chloride flush, sodium chloride flush, traMADol    Patient Profile  66 y/o male recently found to have severe LV dysfunction in setting of markedly dilated aortic root (67 mm) and severe AI (tricuspid AoV) and single vessel CAD, now s/p Bentall and CABG x 1 w/ SVG-RCA. AHF team consulted to assist w/ post-operative management of systolic heart failure.     Assessment/Plan   Ascending Aortic Aneurysm/  Severe AI  - CTA  w/ severe dilation at the sinus of Valsalva, 67 mm. Moderate dilation of mid ascending aorta, 44 mm. Tricuspid AoV - s/p Bentall with bioprosthetic aortic valve 9/27 - intraoperative TEE, no perivalvular leak post repair  - Echo 10/10 EF 25% RV function improving. Moderate to severe MR (in setting of LBBB) AVR ok   - Echo 10/23 EF 20% RV moderately reduced. Moderate to severe PI. AVR ok  - CTA 10/16 negative for dissection.   2. CAD - single vessel, 60-70% pRCA  - s/p CABG x 1, SVG- RCA  - ASA + statin  - No BB for now w/ bradycardia     3. Chronic Systolic Heart Failure  - newly diagnosed; Echo 9/23 EF 25-30%, LV severely dilated, severe AI, Global HK, RV ok  - NICM, severity out of proportion to degree of CAD  - likely valvular CM in setting of severe AI +/- LBBB CM  - now post op bioprosthetic AVR + CABG. Intraoperative TEE LVEF 20-25%, RV ok - With junctional rhythm, will hold off on beta blocker and digoxin for now to avoid worsening A-V conduction. Eventual CRT-D (see below).  - Hypotensive 10/04 in setting of urinary retention.  - Echo 10/9- EF 25% RV function improving. Moderate to severe MR (in setting of LBBB) AVR ok   - Echo 10/23-  EF 20% RV moderately reduced. Moderate to severe PI. AVR ok  - Volume status ok. CVP 4-5 - Continue 12.5 losartan daily, monitor for hypotension, encourage slow position changes to avoid postural hypotension - Continue spiro 12.5 mg daily - Now off midodrine.  - would avoid SGLT2i given urinary retention/foley   4. Junctional rhythm - Resolved  - Has epicardial LV lead. EP following. Had been considering CRT-D prior to discharge but given need for DC-CV and prolonged hospitalization likely better wait at least several weeks until he recovers   5. LBBB -Will eventually need epicardial lead hooked up for CRT see discussion above   6. Anemia - Post-op, 1 u RBCs 10/04. - Last CBC 10/23 Hgb 9.5  7. Thrombocytopenia -  Resolved.  8. Atrial fib/flutter, post-op - s/p DCCV 10/12>>NSR  - Back in A fib 10/16 - Maintaining SR--rate upper 40s-50s - Amiodarone decreased to 200 mg daily  - Eliquis 5 mg bid   9. Urinary retention/UTI  - Followed by [facility] every 6 months and has been on flomax for some time.  - Recurrent bladder distention. CT abd--> 1600 cc urine.  Foley replaced with 1300 cc out.  - UA +. Completed course of Rocephin - Continues w/ foley.    10. Post-op ileus/ Constipation  - 10/9 KUB c/w  ileus  - Resolved  11. Hypokalemia/Hypomagnesemia  - Resolved - Keep K > 4 and Mag > 2  12. Hyponatremia - Resolved  13. Back Pain  - CTA 10/16 negative for dissection  - suspect 2/2 bladder distention   14. RUE Pain/ PICC Line Associated DVT  - [place] + for Acute nonocclusive DVT surrounding the picc line in the subclavian vein - PICC replaced - Already on Eliquis for Afib  15. Situational Depression  - started sertraline 10/18   Encouraged to ambulate. PT recommending HH PT. TOC following. Worry he needs more support, his mother is elderly and cares for him. Plan now to go to CIR. Awaiting insurance approval.    Length of Stay: [place] PA-C  8:30 AM  Patient seen and examined with the above-signed Advanced Practice Provider and/or Housestaff. I personally reviewed laboratory data, imaging studies and relevant notes. I independently examined the patient and formulated the important aspects of the plan. I have edited the note to reflect any of my changes or salient points. I have personally discussed the plan with the patient and/or family.  Denies CP or SOB. Butt sore. Has unstageable sacral ulcer.   BP improved on losartan. Remains in NSR. Volume status ok  CVP 5  General:  Weak appearing. No resp difficulty HEENT: normal Neck: supple. no JVD. Carotids 2+ bilat; no bruits. No lymphadenopathy or thryomegaly appreciated. Cor: PMI nondisplaced. Regular rate &  rhythm. No rubs, gallops or murmurs. Lungs: clear Abdomen: soft, nontender, nondistended. No hepatosplenomegaly. No bruits or masses. Good bowel sounds. Extremities: no cyanosis, clubbing, rash, edema Neuro: alert & orientedx3, cranial nerves grossly intact. moves all 4 extremities w/o difficulty. Affect pleasant  Stable from cardiac perspective. Hold off on diuretics for now. Appreciate Wound Care's help. Being evaluated for CIR - I think this would be very important for him.   Glori Bickers, MD  5:58 PM

## 2022-08-05 NOTE — TOC Progression Note (Signed)
Transition of Care [facility]) - Progression Note    Patient Details  Name: KENTRAIL SHEW MRN: [id number] Date of Birth: [birth year]  Transition of Care [facility]) CM/SW [place], [place] Phone Number: 08/05/2022, 5:00 PM  Called patient's mother home and cell number- will call again tomorrow . Unable to complete assessment.  Thurmond Butts, MSW, LCSW Clinical Social Worker                                                 Social Determinants of Health (SDOH) Interventions    Readmission Risk Interventions     No data to display

## 2022-08-05 NOTE — Progress Notes (Addendum)
Inpatient Rehabilitation Admissions Coordinator   I have received a denial for CIR admit. I spoke with patient at bedside and he is aware. I have left a message for his Mom, Guerry Minors, to inform her.  Danne Baxter, RN, MSN Rehab Admissions Coordinator [phone] 08/05/2022 4:29 PM  I spoke with Mom, Guerry Minors, and she is aware. She would like to make arrangements to take him home. We will sign off.  Danne Baxter, RN, MSN Rehab Admissions Coordinator [phone] 08/05/2022 4:56 PM

## 2022-08-05 NOTE — Progress Notes (Signed)
Mobility Specialist Progress Note:   08/05/22 1300  Mobility  Activity Ambulated with assistance in hallway  Level of Assistance Contact guard assist, steadying assist  Assistive Device Front wheel walker  Distance Ambulated (ft) 80 ft  Activity Response Tolerated well  $Mobility charge 1 Mobility   Pt received in bed willing to participate in mobility. No complaints of pain. Upon getting back to room pt had loose stool required peri-care. Left in chair with call bell in reach and all needs met.   [facility] Surveyor, mining Chat only

## 2022-08-05 NOTE — Plan of Care (Signed)
  Problem: Activity: Goal: Ability to return to baseline activity level will improve Outcome: Progressing   Problem: Cardiovascular: Goal: Ability to achieve and maintain adequate cardiovascular perfusion will improve Outcome: Progressing   Problem: Health Behavior/Discharge Planning: Goal: Ability to manage health-related needs will improve Outcome: Progressing   Problem: Clinical Measurements: Goal: Will remain free from infection Outcome: Progressing   Problem: Activity: Goal: Risk for activity intolerance will decrease Outcome: Progressing

## 2022-08-06 ENCOUNTER — Ambulatory Visit: Payer: Medicare Other

## 2022-08-06 DIAGNOSIS — Z95828 Presence of other vascular implants and grafts: Secondary | ICD-10-CM | POA: Diagnosis not present

## 2022-08-06 LAB — BASIC METABOLIC PANEL
Anion gap: 8 (ref 5–15)
BUN: 19 mg/dL (ref 8–23)
CO2: 29 mmol/L (ref 22–32)
Calcium: 9.1 mg/dL (ref 8.9–10.3)
Chloride: 100 mmol/L (ref 98–111)
Creatinine, Ser: 0.81 mg/dL (ref 0.61–1.24)
GFR, Estimated: >60 mL/min (ref >60)
Glucose, Bld: 103 mg/dL — ABNORMAL HIGH (ref 70–99)
Potassium: 4 mmol/L (ref 3.5–5.1)
Sodium: 137 mmol/L (ref 135–145)

## 2022-08-06 LAB — MAGNESIUM: Magnesium: 2 mg/dL (ref 1.7–2.4)

## 2022-08-06 MED ORDER — LOSARTAN POTASSIUM 25 MG PO TABS
25.0000 mg | ORAL_TABLET | Freq: Every day | ORAL | Status: DC
  Administered 2022-08-06: 25 mg via ORAL
  Filled 2022-08-06: qty 1

## 2022-08-06 NOTE — Progress Notes (Signed)
Occupational Therapy Treatment Patient Details Name: Joe [name] MRN: [id number] DOB: 18-Jan-1956 Today's Date: 08/06/2022   History of present illness 66 yo male admitted 07/08/22 for ascending aortic arch replacement with Bentall procedure and CABGx1 same date. Extubated 9/28. Ileus, Afib and CHF post procedure. Hosptial stay continue to be complicated by back pain and UTI. PMHx: congenital brain damage, speech delay, bil THA, PLIF, Lt TSA, PVC   OT comments  Patient supine in bed and eager to get OOB.  Pts mom reports a very restless night.  Patient completing bed mobility with supervision, transfers with min guard using RW but continues to require cueing for hand placement and precautions. Pt reports dizziness in standing today, declined further mobility in room.  +BM in room with max assist for posterior peri care.  Mother at side upon exit.  Will follow acutely.  Updated to [facility].     Recommendations for follow up therapy are one component of a multi-disciplinary discharge planning process, led by the attending physician.  Recommendations may be updated based on patient status, additional functional criteria and insurance authorization.    Follow Up Recommendations  Home health OT    Assistance Recommended at Discharge Frequent or constant Supervision/Assistance  Patient can return home with the following  A little help with walking and/or transfers;A lot of help with bathing/dressing/bathroom;Assistance with cooking/housework;Direct supervision/assist for medications management;Direct supervision/assist for financial management;Help with stairs or ramp for entrance;Assist for transportation   Equipment Recommendations  None recommended by OT    Recommendations for Other Services      Precautions / Restrictions Precautions Precautions: Sternal;Fall Precaution Comments: frequent cueing for precautions Restrictions Weight Bearing Restrictions: Yes RUE Partial Weight Bearing  Percentage or Pounds: sternal precautions Other Position/Activity Restrictions: sternal precautions       Mobility Bed Mobility Overal bed mobility: Needs Assistance Bed Mobility: Rolling, Sidelying to Sit Rolling: Supervision Sidelying to sit: Supervision       General bed mobility comments: increased time and cueing but no physical assist required    Transfers Overall transfer level: Needs assistance Equipment used: Rolling walker (2 wheels) Transfers: Sit to/from Stand Sit to Stand: Min guard           General transfer comment: cueing for hand placement and initation for power up     Balance Overall balance assessment: Needs assistance Sitting-balance support: Feet supported, No upper extremity supported Sitting balance-Leahy Scale: Good     Standing balance support: Bilateral upper extremity supported, During functional activity Standing balance-Leahy Scale: Poor Standing balance comment: Reliant on RW for stability in standing, does not attempt removal of hands                           ADL either performed or assessed with clinical judgement   ADL Overall ADL's : Needs assistance/impaired                     Lower Body Dressing: Maximal assistance;Sit to/from stand   Toilet Transfer: Min guard;Ambulation;Rolling walker (2 wheels) Toilet Transfer Details (indicate cue type and reason): to [facility] Toileting- Clothing Manipulation and Hygiene: Maximal assistance;Sit to/from stand Toileting - Clothing Manipulation Details (indicate cue type and reason): due to peri wounds, after + BM     Functional mobility during ADLs: Min guard;Rolling walker (2 wheels);Cueing for sequencing General ADL Comments: cues for initiation, sequencing and problem solving. sternal precuations    Extremity/Trunk Assessment  Vision       Perception     Praxis      Cognition Arousal/Alertness: Awake/alert Behavior During Therapy: Flat  affect Overall Cognitive Status: History of cognitive impairments - at baseline Area of Impairment: Attention, Following commands, Problem solving                   Current Attention Level: Sustained   Following Commands: Follows one step commands consistently, Follows multi-step commands inconsistently, Follows one step commands with increased time     Problem Solving: Slow processing, Decreased initiation, Requires verbal cues General Comments: cueing for sternal precautions        Exercises      Shoulder Instructions       General Comments VSS on RA, pt reports dizziness in standing today    Pertinent Vitals/ Pain       Pain Assessment Pain Assessment: Faces Faces Pain Scale: Hurts a little bit Pain Location: rear peri Pain Descriptors / Indicators: Grimacing, Discomfort Pain Intervention(s): Monitored during session, Repositioned  Home Living                                          Prior Functioning/Environment              Frequency  Min 2X/week        Progress Toward Goals  OT Goals(current goals can now be found in the care plan section)  Progress towards OT goals: Progressing toward goals  Acute Rehab OT Goals Patient Stated Goal: get better OT Goal Formulation: With patient Time For Goal Achievement: 08/18/22 Potential to Achieve Goals: Good  Plan Frequency remains appropriate;Discharge plan needs to be updated    Co-evaluation                 AM-PAC OT "6 Clicks" Daily Activity     Outcome Measure   Help from another person eating meals?: A Little Help from another person taking care of personal grooming?: A Little Help from another person toileting, which includes using toliet, bedpan, or urinal?: A Lot Help from another person bathing (including washing, rinsing, drying)?: A Lot Help from another person to put on and taking off regular upper body clothing?: A Little Help from another person to put on  and taking off regular lower body clothing?: A Lot 6 Click Score: 15    End of Session Equipment Utilized During Treatment: Gait belt;Rolling walker (2 wheels)  OT Visit Diagnosis: Unsteadiness on feet (R26.81);Repeated falls (R29.6);Muscle weakness (generalized) (M62.81);Pain   Activity Tolerance Patient tolerated treatment well   Patient Left in chair;with call bell/phone within reach;with family/visitor present   Nurse Communication Mobility status        Time: 9150-5697 OT Time Calculation (min): 29 min  Charges: OT General Charges $OT Visit: 1 Visit OT Treatments $Self Care/Home Management : 23-37 mins  [place] Office [phone]   Delight Stare 08/06/2022, 10:12 AM

## 2022-08-06 NOTE — Progress Notes (Signed)
CARDIAC REHAB PHASE I  Pt sitting in chair. Attempted walk earlier this morning and was dizzy so sat in chair, did not ambulate. Pt wanted to try again. Pt assisted to stand with moderate assist to stand. Pt still felt dizzy did not pass after standing for a minute. Pt then needed to have BM. Helped to [facility]. After stood to attempt  walk again however pt felt to dizzy and weak. Blood pressure remained stable before and after standing, 150/84, 148/88. Returned to chair with call bell and bedside table in reach. Will continue to follow.   1000-1100  Vanessa Barbara, RN BSN 08/06/2022 10:44 AM

## 2022-08-06 NOTE — Progress Notes (Signed)
      [address]       [place] [address]             [phone]      14 Days Post-Op Procedure(s) (LRB): TRANSESOPHAGEAL ECHOCARDIOGRAM (TEE) (N/A) CARDIOVERSION (N/A) Subjective: Patient states his bottom hurts today.  Objective: Vital signs in last 24 hours: Temp:  [97.3 F (36.3 C)-98.2 F (36.8 C)] 98.2 F (36.8 C) (10/26 0351) Pulse Rate:  [51-59] 56 (10/26 0351) Cardiac Rhythm: Heart block (10/25 1929) Resp:  [12-17] 16 (10/26 0351) BP: (117-150)/(57-79) 146/79 (10/26 0351) SpO2:  [95 %-98 %] 97 % (10/26 0351)  Hemodynamic parameters for last 24 hours: CVP:  [3 mmHg-7 mmHg] 4 mmHg  Intake/Output from previous day: 10/25 0701 - 10/26 0700 In: 160 [P.O.:140; I.V.:20] Out: 1620 [Urine:1620] Intake/Output this shift: No intake/output data recorded.  General appearance: alert, cooperative, and no distress Neurologic: intact Heart: regular rate and rhythm, S1, S2 normal, no murmur, click, rub or gallop Lungs: clear to auscultation bilaterally Abdomen: soft, non-tender; bowel sounds normal; no masses,  no organomegaly Extremities: extremities normal, atraumatic, no cyanosis or edema Wound: Clean, dry, intact. Full thickness sacral wound, clean dressing  Lab Results: Recent Labs    08/03/22 1110  WBC 7.1  HGB 9.5*  HCT 28.4*  PLT 321   BMET:  Recent Labs    08/05/22 0500 08/06/22 0421  NA 138 137  K 4.0 4.0  CL 99 100  CO2 32 29  GLUCOSE 96 103*  BUN 17 19  CREATININE 0.83 0.81  CALCIUM 9.3 9.1    PT/INR: No results for input(s): "LABPROT", "INR" in the last 72 hours. ABG    Component Value Date/Time   PHART 7.342 (L) 07/10/2022 0546   HCO3 23.4 07/10/2022 0546   TCO2 30 07/16/2022 0711   ACIDBASEDEF 2.0 07/10/2022 0546   O2SAT 70.3 08/04/2022 0922   CBG (last 3)  No results for input(s): "GLUCAP" in the last 72 hours.  Assessment/Plan: S/P Procedure(s) (LRB): TRANSESOPHAGEAL ECHOCARDIOGRAM (TEE) (N/A) CARDIOVERSION  (N/A)  CV: NSR, rate 55 this AM. SBP 146. AHF managing optimizing chronic systolic HF with GDMT.  Pulm: Saturating 97% on RA, continue IS and ambulation.  GI: Hx of postop ileus, +BM yesterday Renal: Catheter in place for hx urinary retention, UO 1620 over 24 hours. Cr 0.81. DVT: swelling improved, on eliquis Sacral wound: Continue dressing changes Dispo: Denied from CIR, consulted TOC for SNF vs HH recommendations.    LOS: 29 days    Magdalene River, PA-C 08/06/2022

## 2022-08-06 NOTE — Progress Notes (Addendum)
Mobility Specialist Progress Note:   08/06/22 1115  Mobility  Activity Ambulated with assistance in room;Ambulated with assistance in hallway  Level of Assistance Minimal assist, patient does 75% or more  Assistive Device Front wheel walker  Distance Ambulated (ft) 34 ft  Activity Response Tolerated well  $Mobility charge 1 Mobility   Pt received in chair willing to participate in mobility. No complaints of pain. MinA to stand then contact guard throughout ambulation. Left in chair with call bell in reach and all needs met.   [facility] Surveyor, mining Chat only

## 2022-08-06 NOTE — Progress Notes (Addendum)
Patient ID: Joe [name], male   DOB: [birth year], 65 y.o.   MRN: [id number]     Advanced Heart Failure Rounding Note  PCP-Cardiologist: Skeet Latch, MD   Subjective:    9/27 s/p Bentall with bioprosthetic aortic valve . Post-op course c/b ileus.   S/p DCCV 10/12--> NSR/SB  BPs running a bit higher, ~025K systolic. SCr/K normal.   Wt stable. CVP 4-5  Had BM x 2 yesterday, per mother report.   Feels weak today. Insurance denied CIR.   Objective:   Weight Range: 67.5 kg Body mass index is 23.31 kg/m.   Vital Signs:   Temp:  [97.3 F (36.3 C)-98.2 F (36.8 C)] 98.2 F (36.8 C) (10/26 0351) Pulse Rate:  [51-59] 56 (10/26 0351) Resp:  [12-17] 16 (10/26 0351) BP: (117-150)/(57-79) 146/79 (10/26 0351) SpO2:  [95 %-98 %] 97 % (10/26 0351) Last BM Date : 08/03/22  Weight change: Filed Weights   07/31/22 0600 08/01/22 0403 08/04/22 0500  Weight: 59.8 kg 67.5 kg 67.5 kg    Intake/Output:   Intake/Output Summary (Last 24 hours) at 08/06/2022 0730 Last data filed at 08/06/2022 0352 Gross per 24 hour  Intake 160 ml  Output 1620 ml  Net -1460 ml      Physical Exam  CVP 4-5 General:  fatigued looking. No respiratory difficulty HEENT: normal Neck: supple.  no JVD  Carotids 2+ bilat; no bruits. No lymphadenopathy or thyromegaly appreciated. Cor: PMI nondisplaced. Regular rate & rhythm. No rubs, gallops or murmurs. Lungs: clear  Abdomen: soft, nontender, nondistended. No hepatosplenomegaly. No bruits or masses. Active bowel sounds all four quadrants. Extremities: no cyanosis, clubbing, rash, edema. LUE PICC  Neuro: alert & oriented x 3, cranial nerves grossly intact. moves all 4 extremities w/o difficulty. Affect pleasant.   Telemetry   NSR 60s (Personally reviewed)    Labs    CBC Recent Labs    08/03/22 1110  WBC 7.1  HGB 9.5*  HCT 28.4*  MCV 99.3  PLT 270    Basic Metabolic Panel Recent Labs    08/05/22 0500 08/06/22 0421  NA 138 137  K 4.0  4.0  CL 99 100  CO2 32 29  GLUCOSE 96 103*  BUN 17 19  CREATININE 0.83 0.81  CALCIUM 9.3 9.1  MG 2.0 2.0   Liver Function Tests No results for input(s): "AST", "ALT", "ALKPHOS", "BILITOT", "PROT", "ALBUMIN" in the last 72 hours.  BNP: BNP (last 3 results) Recent Labs    06/25/22 1829  BNP 436.2*     Imaging  reviewed  Medications:     Scheduled Medications:  amiodarone  200 mg Oral Daily   apixaban  5 mg Oral BID   aspirin EC  81 mg Oral Daily   atorvastatin  80 mg Oral Daily   Chlorhexidine Gluconate Cloth  6 each Topical Daily   leptospermum manuka honey  1 Application Topical Daily   losartan  12.5 mg Oral Daily   metoCLOPramide (REGLAN) injection  10 mg Intravenous Q8H   pantoprazole  40 mg Oral Daily   polyethylene glycol  17 g Oral Daily   potassium chloride  40 mEq Oral Daily   senna-docusate  2 tablet Oral QHS   sertraline  25 mg Oral Daily   sodium chloride flush  10-40 mL Intracatheter Q12H   sodium chloride flush  3 mL Intravenous Q12H   spironolactone  12.5 mg Oral Daily   tamsulosin  0.4 mg Oral BID    Infusions:  sodium chloride Stopped (07/22/22 1203)   promethazine (PHENERGAN) injection (IM or IVPB)     sodium chloride      PRN Medications: sodium chloride, acetaminophen, bisacodyl, diazepam, hydrocortisone, hydrocortisone, melatonin, ondansetron (ZOFRAN) IV, mouth rinse, polyethylene glycol, promethazine (PHENERGAN) injection (IM or IVPB), senna-docusate, sodium chloride flush, sodium chloride flush, traMADol    Patient Profile  66 y/o male recently found to have severe LV dysfunction in setting of markedly dilated aortic root (67 mm) and severe AI (tricuspid AoV) and single vessel CAD, now s/p Bentall and CABG x 1 w/ SVG-RCA. AHF team consulted to assist w/ post-operative management of systolic heart failure.     Assessment/Plan   Ascending Aortic Aneurysm/ Severe AI  - CTA  w/ severe dilation at the sinus of Valsalva, 67 mm. Moderate  dilation of mid ascending aorta, 44 mm. Tricuspid AoV - s/p Bentall with bioprosthetic aortic valve 9/27 - intraoperative TEE, no perivalvular leak post repair  - Echo 10/10 EF 25% RV function improving. Moderate to severe MR (in setting of LBBB) AVR ok   - Echo 10/23 EF 20% RV moderately reduced. Moderate to severe PI. AVR ok  - CTA 10/16 negative for dissection.   2. CAD - single vessel, 60-70% pRCA  - s/p CABG x 1, SVG- RCA  - ASA + statin  - No BB for now w/ bradycardia     3. Chronic Systolic Heart Failure  - newly diagnosed; Echo 9/23 EF 25-30%, LV severely dilated, severe AI, Global HK, RV ok  - NICM, severity out of proportion to degree of CAD  - likely valvular CM in setting of severe AI +/- LBBB CM  - now post op bioprosthetic AVR + CABG. Intraoperative TEE LVEF 20-25%, RV ok - With junctional rhythm, will hold off on beta blocker and digoxin for now to avoid worsening A-V conduction. Eventual CRT-D (see below).  - Hypotensive 10/04 in setting of urinary retention.  - Echo 10/9- EF 25% RV function improving. Moderate to severe MR (in setting of LBBB) AVR ok   - Echo 10/23-  EF 20% RV moderately reduced. Moderate to severe PI. AVR ok  - Volume status ok. CVP 4-5. BP trending up - Increase Losartan to 25 mg daily  - Continue spiro 12.5 mg daily - Now off midodrine.  - would avoid SGLT2i given urinary retention/foley   4. Junctional rhythm - Resolved  - Has epicardial LV lead. EP following. Had been considering CRT-D prior to discharge but given need for DC-CV and prolonged hospitalization likely better wait at least several weeks until he recovers   5. LBBB -Will eventually need epicardial lead hooked up for CRT see discussion above   6. Anemia - Post-op, 1 u RBCs 10/04. - Last CBC 10/23 Hgb 9.5  7. Thrombocytopenia - Resolved.  8. Atrial fib/flutter, post-op - s/p DCCV 10/12>>NSR  - Back in A fib 10/16 - Maintaining SR--rate upper 50s-60s - Amiodarone decreased  to 200 mg daily  - Eliquis 5 mg bid   9. Urinary retention/UTI  - Followed by [facility] every 6 months and has been on flomax for some time.  - Recurrent bladder distention. CT abd--> 1600 cc urine.  Foley replaced with 1300 cc out.  - UA +. Completed course of Rocephin - Continues w/ foley.    10. Post-op ileus/ Constipation  - 10/9 KUB c/w ileus  - Resolved  11. Hypokalemia/Hypomagnesemia  - Resolved - Keep K > 4 and Mag > 2  12.  Hyponatremia - Resolved  13. Back Pain  - CTA 10/16 negative for dissection  - suspect 2/2 bladder distention   14. RUE Pain/ PICC Line Associated DVT  - [place] + for Acute nonocclusive DVT surrounding the picc line in the subclavian vein - PICC replaced - Already on Eliquis for Afib  15. Situational Depression  - started sertraline 10/18   16. Deconditioning - CIR denied - PT recommending HH PT - encouraged ambulation today. PT to see after breakfast     Length of Stay: [place] PA-C  7:30 AM  Patient seen and examined with the above-signed Advanced Practice Provider and/or Housestaff. I personally reviewed laboratory data, imaging studies and relevant notes. I independently examined the patient and formulated the important aspects of the plan. I have edited the note to reflect any of my changes or salient points. I have personally discussed the plan with the patient and/or family.  Feels weak. Butt still sore. Remains in NSR. SBP 140s. Insurance denied CIR  General:  Weak appearing. No resp difficulty HEENT: normal Neck: supple. no JVD. Carotids 2+ bilat; no bruits. No lymphadenopathy or thryomegaly appreciated. Cor: PMI nondisplaced. Regular rate & rhythm. No rubs, gallops or murmurs. Lungs: clear Abdomen: soft, nontender, nondistended. No hepatosplenomegaly. No bruits or masses. Good bowel sounds. Extremities: no cyanosis, clubbing, rash, edema Neuro: alert & orientedx3, cranial nerves grossly intact. moves all 4  extremities w/o difficulty. Affect pleasant  Volume status stable. Remains in NSR. SBP elevated.   Agree with increasing losartan. Can we repeal insurance decision for CIR?   Glori Bickers, MD  11:32 AM

## 2022-08-07 DIAGNOSIS — Z95828 Presence of other vascular implants and grafts: Secondary | ICD-10-CM | POA: Diagnosis not present

## 2022-08-07 LAB — CBC
HCT: 29.6 % — ABNORMAL LOW (ref 39.0–52.0)
Hemoglobin: 10 g/dL — ABNORMAL LOW (ref 13.0–17.0)
MCH: 32.7 pg (ref 26.0–34.0)
MCHC: 33.8 g/dL (ref 30.0–36.0)
MCV: 96.7 fL (ref 80.0–100.0)
Platelets: 290 10*3/uL (ref 150–400)
RBC: 3.06 MIL/uL — ABNORMAL LOW (ref 4.22–5.81)
RDW: 15.9 % — ABNORMAL HIGH (ref 11.5–15.5)
WBC: 6.4 10*3/uL (ref 4.0–10.5)
nRBC: 0 % (ref 0.0–0.2)

## 2022-08-07 LAB — BASIC METABOLIC PANEL
Anion gap: 8 (ref 5–15)
BUN: 24 mg/dL — ABNORMAL HIGH (ref 8–23)
CO2: 28 mmol/L (ref 22–32)
Calcium: 9.1 mg/dL (ref 8.9–10.3)
Chloride: 102 mmol/L (ref 98–111)
Creatinine, Ser: 0.76 mg/dL (ref 0.61–1.24)
GFR, Estimated: >60 mL/min (ref >60)
Glucose, Bld: 93 mg/dL (ref 70–99)
Potassium: 4.3 mmol/L (ref 3.5–5.1)
Sodium: 138 mmol/L (ref 135–145)

## 2022-08-07 LAB — MAGNESIUM: Magnesium: 2.1 mg/dL (ref 1.7–2.4)

## 2022-08-07 MED ORDER — ALTEPLASE 2 MG IJ SOLR
INTRAMUSCULAR | Status: AC
  Administered 2022-08-07: 2 mg
  Filled 2022-08-07: qty 2

## 2022-08-07 MED ORDER — LOSARTAN POTASSIUM 50 MG PO TABS
50.0000 mg | ORAL_TABLET | Freq: Every day | ORAL | Status: DC
  Administered 2022-08-07 – 2022-08-11 (×5): 50 mg via ORAL
  Filled 2022-08-07 (×5): qty 1

## 2022-08-07 NOTE — Progress Notes (Signed)
Mobility Specialist Progress Note:   08/07/22 1351  Mobility  Activity Ambulated with assistance in hallway  Level of Assistance Contact guard assist, steadying assist  Assistive Device Front wheel walker  Distance Ambulated (ft) 140 ft  Activity Response Tolerated well  $Mobility charge 1 Mobility   Pt received in chair willing to participate in mobility. No complaints of pain. Left in chair with call bell in reach and all needs met.   [facility] Surveyor, mining Chat only

## 2022-08-07 NOTE — TOC Initial Note (Addendum)
Transition of Care [facility]) - Initial/Assessment Note    Patient Details  Name: Joe [name] MRN: [id number] Date of Birth: 03/13/1956  Transition of Care [facility]) CM/SW Contact:    Erenest Rasher, RN Phone Number: [phone] 08/07/2022, 4:00 PM  Clinical Narrative:                 HF TOC CM spoke to pt and mother. Discussed HH and services. HH arranged with [facility]. Contacted [facility] rep, Caryl Pina to make aware of possible dc home on next week. Mother states he goes to [facility] and goes every 6 months. Will send message to attending about appt for Urology and [facility]. Mother states she will wait to discuss wheelchair and hospital bed. Attending updated.   Attending arranged Appt scheduled for [facility] for 08/25/2022. Attending waiting to hear back from [facility] for appt.   Expected Discharge Plan: [place] Barriers to Discharge: Continued Medical Work up   Patient Goals and CMS Choice   CMS Medicare.gov Compare Post Acute Care list provided to:: Patient Represenative (must comment) (Mother-[name]) Choice offered to / list presented to : Parent  Expected Discharge Plan and Services Expected Discharge Plan: [place] Acute Care Choice: Home Health                             HH Arranged: RN, OT, PT, Nurse's Aide [facility] Agency: [place] [facility]) Date [facility] Agency Contacted: 08/07/22 Time [place]: 46 Representative spoke with at [place]: Caryl Pina  Prior Living Arrangements/Services                  Current home services: DME (scale, Conservation officer, nature, Bedside commode)    Activities of Daily Living Home Assistive Devices/Equipment: Blood pressure cuff, Eyeglasses ADL Screening (condition at time of admission) Patient's cognitive ability adequate to safely complete daily activities?: No Is the patient deaf or have difficulty hearing?: No Does the  patient have difficulty seeing, even when wearing glasses/contacts?: No Does the patient have difficulty concentrating, remembering, or making decisions?: Yes Patient able to express need for assistance with ADLs?: Yes Does the patient have difficulty dressing or bathing?: No Independently performs ADLs?: Yes (appropriate for developmental age) Does the patient have difficulty walking or climbing stairs?: No Weakness of Legs: None Weakness of Arms/Hands: None  Permission Sought/Granted                  Emotional Assessment              Admission diagnosis:  S/P ascending aortic replacement [Z95.828] Patient Active Problem List   Diagnosis Date Noted   Pressure injury of skin 07/24/2022   Accelerated junctional rhythm    Acute respiratory failure with hypoxia (HCC)    Drug-induced constipation    Hemorrhoids    Shock ([place])    S/P ascending aortic replacement 07/08/2022   S/P CABG x 1    On mechanically assisted ventilation (HCC)    Chronic combined systolic and diastolic heart failure ([place])    Aortic aneurysm ([place]) 06/25/2022   PVC (premature ventricular contraction) 05/26/2022   Lower extremity edema 05/26/2022   LBBB (left bundle branch block) 05/26/2022   Murmur 05/26/2022   Primary localized osteoarthrosis of left shoulder 09/21/2017   Primary localized osteoarthrosis of shoulder 09/21/2017   Osteoarthritis of right hip 05/15/2014  Hip arthritis 05/15/2014   Hyponatremia 01/18/2014   Osteoarthritis of left hip 01/16/2014   Abdominal distension 12/14/2012   Urinary retention 12/12/2012   Abnormal LFTs 12/12/2012   Mental status change 12/11/2012   Left leg pain 08/27/2011   PCP:  Willey Blade, MD Pharmacy:   [facility], [place] - [address] AT [facility] [address] [place] [address] Phone: [phone] Fax: [place] [address]  [place] [address] Phone: [phone] Fax: [phone]     Social Determinants of Health (SDOH) Interventions    Readmission Risk Interventions     No data to display

## 2022-08-07 NOTE — Progress Notes (Signed)
      [address]       [place] [address]             [phone]        15 Days Post-Op Procedure(s) (LRB): TRANSESOPHAGEAL ECHOCARDIOGRAM (TEE) (N/A) CARDIOVERSION (N/A)  Subjective: Patient tired.  Objective: Vital signs in last 24 hours: Temp:  [97.6 F (36.4 C)-98.3 F (36.8 C)] 98 F (36.7 C) (10/27 0806) Pulse Rate:  [53-59] 53 (10/27 0806) Cardiac Rhythm: Heart block (10/26 1900) Resp:  [17-20] 17 (10/27 0806) BP: (127-152)/(69-90) 142/74 (10/27 0806) SpO2:  [95 %-97 %] 97 % (10/27 0806) Weight:  [69.2 kg] 69.2 kg (10/27 0418)  Pre op weight 71.2 kg Current Weight  08/07/22 69.2 kg    Hemodynamic parameters for last 24 hours: CVP:  [3 mmHg-5 mmHg] 4 mmHg  Intake/Output from previous day: 10/26 0701 - 10/27 0700 In: -  Out: 1450 [Urine:1450]   Physical Exam:  Cardiovascular: RRR, no murmur Pulmonary: Clear to auscultation bilaterally Abdomen: Soft, mildly generalized tenderness, high pitched bowel sounds present. Extremities: No lower extremity edema. Wounds: Clean and dry.  No erythema or signs of infection.  Lab Results: CBC: Recent Labs    08/07/22 0426  WBC 6.4  HGB 10.0*  HCT 29.6*  PLT 290   BMET:  Recent Labs    08/06/22 0421 08/07/22 0426  NA 137 138  K 4.0 4.3  CL 100 102  CO2 29 28  GLUCOSE 103* 93  BUN 19 24*  CREATININE 0.81 0.76  CALCIUM 9.1 9.1    PT/INR:  Lab Results  Component Value Date   INR 1.5 (H) 07/22/2022   INR 1.7 (H) 07/08/2022   INR 1.1 07/07/2022   ABG:  INR: Will add last result for INR, ABG once components are confirmed Will add last 4 CBG results once components are confirmed  Assessment/Plan:  1. CV - Previous junctional rhythm (has epicardial LV lead).Has LBBB. Per EP, will need CRT-D (? At this hospitalization or wait as he has had prolonged hospitalization). He had a fib/flutter post op as well. Of late, SB, first degree heart block. On Amiodarone 200 mg daily,  Apixaban 5 mg bid 2.  Pulmonary - On room air. Encourage incentive spirometer 3. Chronic systolic heart failure- On Spironolactone 12.5 mg daily and Losartan 25 mg daily. Appreciate AHF assistance 4.  Expected post op acute blood loss anemia - H and H slightly increased to 10 and 29.6 5. GU-urinary retention. Continue foley, Flomax 6. RUE DVT-PICC has been replaced. On Apixaban so not on Coumadin etc. 7. Deconditioning-CIR denied. HH PT ordered 8. Hopefully, home early next week  Calianne Larue M ZimmermanPA-C 9:36 AM

## 2022-08-07 NOTE — Progress Notes (Signed)
Difficult to get orthostatic vital signs. Pt has BP only in leg. This pressure is not accurate while standing. Pt vitals taken while sitting, standing, and lying without any dizziness or issues. Payton Emerald, RN

## 2022-08-07 NOTE — Progress Notes (Addendum)
Patient ID: Joe [name], male   DOB: 04-Mar-1956, 66 y.o.   MRN: [id number]     Advanced Heart Failure Rounding Note  PCP-Cardiologist: Skeet Latch, MD   Subjective:    9/27 s/p Bentall with bioprosthetic aortic valve . Post-op course c/b ileus.   S/p DCCV 10/12--> NSR/SB  BPs running a bit higher, ~332/951O systolic. SCr/K normal.   Wt stable. CVP unhooked at the moment, working with PT  Had BM x 2 10/26, per mother report.   Feels ok today, working with PT in room, no complaints. Insurance denied CIR. Denies CP and SOB  Objective:   Weight Range: 69.2 kg Body mass index is 23.89 kg/m.   Vital Signs:   Temp:  [97.6 F (36.4 C)-98.3 F (36.8 C)] 98 F (36.7 C) (10/27 0806) Pulse Rate:  [53-59] 53 (10/27 0806) Resp:  [17-20] 17 (10/27 0806) BP: (127-152)/(69-90) 142/74 (10/27 0806) SpO2:  [95 %-97 %] 97 % (10/27 0806) Weight:  [69.2 kg] 69.2 kg (10/27 0418) Last BM Date : 08/03/22  Weight change: Filed Weights   08/01/22 0403 08/04/22 0500 08/07/22 0418  Weight: 67.5 kg 67.5 kg 69.2 kg    Intake/Output:   Intake/Output Summary (Last 24 hours) at 08/07/2022 1001 Last data filed at 08/07/2022 0437 Gross per 24 hour  Intake --  Output 900 ml  Net -900 ml     Physical Exam   General:  chronically ill appearing.  No respiratory difficulty HEENT: normal Neck: supple. JVD~6 cm. Carotids 2+ bilat; no bruits. No lymphadenopathy or thyromegaly appreciated. Cor: PMI nondisplaced. Regular rate & rhythm. No rubs, gallops or murmurs. Lungs: clear Abdomen: soft, nontender, nondistended. No hepatosplenomegaly. No bruits or masses. Good bowel sounds. Extremities: no cyanosis, clubbing, rash, edema. PICC LUE Neuro: alert & oriented x 3, cranial nerves grossly intact. moves all 4 extremities w/o difficulty. Affect pleasant.   Telemetry   SB-NSR 50s-60s (Personally reviewed)    Labs    CBC Recent Labs    08/07/22 0426  WBC 6.4  HGB 10.0*  HCT 29.6*  MCV  96.7  PLT 841    Basic Metabolic Panel Recent Labs    08/06/22 0421 08/07/22 0426  NA 137 138  K 4.0 4.3  CL 100 102  CO2 29 28  GLUCOSE 103* 93  BUN 19 24*  CREATININE 0.81 0.76  CALCIUM 9.1 9.1  MG 2.0 2.1   Liver Function Tests No results for input(s): "AST", "ALT", "ALKPHOS", "BILITOT", "PROT", "ALBUMIN" in the last 72 hours.  BNP: BNP (last 3 results) Recent Labs    06/25/22 1829  BNP 436.2*     Imaging  reviewed  Medications:     Scheduled Medications:  amiodarone  200 mg Oral Daily   apixaban  5 mg Oral BID   aspirin EC  81 mg Oral Daily   atorvastatin  80 mg Oral Daily   Chlorhexidine Gluconate Cloth  6 each Topical Daily   leptospermum manuka honey  1 Application Topical Daily   losartan  25 mg Oral Daily   metoCLOPramide (REGLAN) injection  10 mg Intravenous Q8H   pantoprazole  40 mg Oral Daily   polyethylene glycol  17 g Oral Daily   potassium chloride  40 mEq Oral Daily   senna-docusate  2 tablet Oral QHS   sertraline  25 mg Oral Daily   sodium chloride flush  10-40 mL Intracatheter Q12H   sodium chloride flush  3 mL Intravenous Q12H   spironolactone  12.5  mg Oral Daily   tamsulosin  0.4 mg Oral BID    Infusions:  sodium chloride Stopped (07/22/22 1203)   promethazine (PHENERGAN) injection (IM or IVPB)      PRN Medications: sodium chloride, acetaminophen, bisacodyl, diazepam, hydrocortisone, hydrocortisone, melatonin, ondansetron (ZOFRAN) IV, mouth rinse, polyethylene glycol, promethazine (PHENERGAN) injection (IM or IVPB), senna-docusate, sodium chloride flush, sodium chloride flush, traMADol    Patient Profile  66 y/o male recently found to have severe LV dysfunction in setting of markedly dilated aortic root (67 mm) and severe AI (tricuspid AoV) and single vessel CAD, now s/p Bentall and CABG x 1 w/ SVG-RCA. AHF team consulted to assist w/ post-operative management of systolic heart failure.     Assessment/Plan   Ascending Aortic  Aneurysm/ Severe AI  - CTA  w/ severe dilation at the sinus of Valsalva, 67 mm. Moderate dilation of mid ascending aorta, 44 mm. Tricuspid AoV - s/p Bentall with bioprosthetic aortic valve 9/27 - intraoperative TEE, no perivalvular leak post repair  - Echo 10/10 EF 25% RV function improving. Moderate to severe MR (in setting of LBBB) AVR ok   - Echo 10/23 EF 20% RV moderately reduced. Moderate to severe PI. AVR ok  - CTA 10/16 negative for dissection.   2. CAD - single vessel, 60-70% pRCA  - s/p CABG x 1, SVG- RCA  - ASA + statin  - No BB for now w/ bradycardia     3. Chronic Systolic Heart Failure  - newly diagnosed; Echo 9/23 EF 25-30%, LV severely dilated, severe AI, Global HK, RV ok  - NICM, severity out of proportion to degree of CAD  - likely valvular CM in setting of severe AI +/- LBBB CM  - now post op bioprosthetic AVR + CABG. Intraoperative TEE LVEF 20-25%, RV ok - With junctional rhythm, will hold off on beta blocker and digoxin for now to avoid worsening A-V conduction. Eventual CRT-D (see below).  - Hypotensive 10/04 in setting of urinary retention.  - Echo 10/9- EF 25% RV function improving. Moderate to severe MR (in setting of LBBB) AVR ok   - Echo 10/23-  EF 20% RV moderately reduced. Moderate to severe PI. AVR ok  - Volume status appears stable on exam. BP trending up - Increase Losartan to 50 mg daily  - Continue spiro 12.5 mg daily - Now off midodrine.  - would avoid SGLT2i given urinary retention/foley   4. Junctional rhythm - Resolved  - Has epicardial LV lead. EP following. Had been considering CRT-D prior to discharge but given need for DC-CV and prolonged hospitalization likely better wait at least several weeks until he recovers   5. LBBB -Will eventually need epicardial lead hooked up for CRT see discussion above   6. Anemia - Post-op, 1 u RBCs 10/04. - Last CBC 10/23 Hgb 10  7. Thrombocytopenia - Resolved.  8. Atrial fib/flutter, post-op - s/p  DCCV 10/12>>NSR  - Back in A fib 10/16 - Maintaining SB/SR--rate upper 50s-60s - Continue Amiodarone 200 mg daily  - Eliquis 5 mg bid   9. Urinary retention/UTI  - Followed by [facility] every 6 months and has been on flomax for some time.  - Recurrent bladder distention. CT abd--> 1600 cc urine.  Foley replaced with 1300 cc out.  - UA +. Completed course of Rocephin - Continues w/ foley.    10. Post-op ileus/ Constipation  - 10/9 KUB c/w ileus  - Resolved  11. Hypokalemia/Hypomagnesemia  - Resolved -  Keep K > 4 and Mag > 2  12. Hyponatremia - Resolved  13. Back Pain  - CTA 10/16 negative for dissection  - suspect 2/2 bladder distention   14. RUE Pain/ PICC Line Associated DVT  - [place] + for Acute nonocclusive DVT surrounding the picc line in the subclavian vein - PICC replaced - Already on Eliquis for Afib  15. Situational Depression  - started sertraline 10/18   16. Deconditioning - CIR denied by insurance - PT recommending [facility] - working with PT on arrival.  - Per mom, possibly home early next week w/ HH aid/PT  Length of Stay: [place] AGACNP-BC  10:01 AM  Patient seen and examined with the above-signed Advanced Practice Provider and/or Housestaff. I personally reviewed laboratory data, imaging studies and relevant notes. I independently examined the patient and formulated the important aspects of the plan. I have edited the note to reflect any of my changes or salient points. I have personally discussed the plan with the patient and/or family.  Continues to c/o sacral pain. No CP or SOB. BP high. Volume ok. Walked 2x  General:  Weak appearing. No resp difficulty HEENT: normal Neck: supple. no JVD. Carotids 2+ bilat; no bruits. No lymphadenopathy or thryomegaly appreciated. Cor: PMI nondisplaced. Regular rate & rhythm. No rubs, gallops or murmurs. Lungs: clear Abdomen: soft, nontender, nondistended. No hepatosplenomegaly. No bruits or masses.  Good bowel sounds. Extremities: no cyanosis, clubbing, rash, edema Neuro: alert & orientedx3, cranial nerves grossly intact. moves all 4 extremities w/o difficulty. Affect pleasant  Stable from cardiac perspective. Agree with increasing losartan. Continue Eliquis.   Home with HHRN/HHPT early next week.   Glori Bickers, MD  4:22 PM

## 2022-08-07 NOTE — Progress Notes (Signed)
Occupational Therapy Treatment Patient Details Name: Joe [name] MRN: [id number] DOB: 1955-12-10 Today's Date: 08/07/2022   History of present illness 66 yo male admitted 07/08/22 for ascending aortic arch replacement with Bentall procedure and CABGx1 same date. Extubated 9/28. Ileus, Afib and CHF post procedure. Hosptial stay continue to be complicated by back pain and UTI. PMHx: congenital brain damage, speech delay, bil THA, PLIF, Lt TSA, PVC   OT comments  Patient continues to make steady progress towards goals in skilled OT session. Patient's session encompassed functional mobility, toileting transfers, ADLs, and further education with regard to sternal precautions. Patient requiring max cues for sternal precautions throughout session, benefiting from rocking to build momentum to come into standing. Patient requiring max A for peri-care during session (unaware of BM) and then significant extra time to ambulate to hospital room door and back. Handout for sternal precautions provided to mother at end of session. Discharge remains appropriate, OT will continue to follow.    Recommendations for follow up therapy are one component of a multi-disciplinary discharge planning process, led by the attending physician.  Recommendations may be updated based on patient status, additional functional criteria and insurance authorization.    Follow Up Recommendations  Home health OT    Assistance Recommended at Discharge Frequent or constant Supervision/Assistance  Patient can return home with the following  A little help with walking and/or transfers;A lot of help with bathing/dressing/bathroom;Assistance with cooking/housework;Direct supervision/assist for medications management;Direct supervision/assist for financial management;Help with stairs or ramp for entrance;Assist for transportation   Equipment Recommendations  None recommended by OT    Recommendations for Other Services      Precautions  / Restrictions Precautions Precautions: Sternal;Fall Precaution Comments: frequent cueing for precautions Restrictions Weight Bearing Restrictions: Yes RUE Partial Weight Bearing Percentage or Pounds: sternal precautions Other Position/Activity Restrictions: sternal precautions       Mobility Bed Mobility Overal bed mobility: Needs Assistance Bed Mobility: Rolling, Sidelying to Sit Rolling: Supervision Sidelying to sit: Min assist       General bed mobility comments: increased time and cueing, min A to scoot hips to EOB and adhere to sternal precautions    Transfers Overall transfer level: Needs assistance Equipment used: Rolling walker (2 wheels) Transfers: Sit to/from Stand Sit to Stand: Min assist           General transfer comment: cueing for hand placement and initation for power up adhering to sternal precautions     Balance Overall balance assessment: Needs assistance Sitting-balance support: Feet supported, No upper extremity supported Sitting balance-Leahy Scale: Good     Standing balance support: Bilateral upper extremity supported, During functional activity Standing balance-Leahy Scale: Poor Standing balance comment: Reliant on RW for stability in standing, does not attempt removal of hands                           ADL either performed or assessed with clinical judgement   ADL Overall ADL's : Needs assistance/impaired                         Toilet Transfer: Min guard;Ambulation;Rolling walker (2 wheels) Toilet Transfer Details (indicate cue type and reason): to [facility] Toileting- Clothing Manipulation and Hygiene: Maximal assistance;Sit to/from stand Toileting - Clothing Manipulation Details (indicate cue type and reason): due to peri wounds, after + BM     Functional mobility during ADLs: Min guard;Rolling walker (2 wheels);Cueing for sequencing General ADL  Comments: cues for initiation, sequencing and problem solving. sternal  precuations    Extremity/Trunk Assessment              Vision       Perception     Praxis      Cognition Arousal/Alertness: Awake/alert Behavior During Therapy: Flat affect Overall Cognitive Status: History of cognitive impairments - at baseline Area of Impairment: Attention, Following commands, Problem solving, Safety/judgement                   Current Attention Level: Sustained Memory: Decreased recall of precautions Following Commands: Follows one step commands consistently, Follows multi-step commands inconsistently, Follows one step commands with increased time Safety/Judgement: Decreased awareness of safety   Problem Solving: Slow processing, Decreased initiation, Requires verbal cues General Comments: max cueing for sternal precautions        Exercises      Shoulder Instructions       General Comments      Pertinent Vitals/ Pain       Pain Assessment Pain Assessment: Faces Faces Pain Scale: Hurts a little bit Pain Location: rear peri Pain Descriptors / Indicators: Grimacing, Discomfort Pain Intervention(s): Limited activity within patient's tolerance, Monitored during session, Repositioned  Home Living                                          Prior Functioning/Environment              Frequency  Min 2X/week        Progress Toward Goals  OT Goals(current goals can now be found in the care plan section)  Progress towards OT goals: Progressing toward goals  Acute Rehab OT Goals Patient Stated Goal: get better OT Goal Formulation: With patient Time For Goal Achievement: 08/18/22 Potential to Achieve Goals: Fair  Plan Frequency remains appropriate;Discharge plan needs to be updated    Co-evaluation                 AM-PAC OT "6 Clicks" Daily Activity     Outcome Measure   Help from another person eating meals?: A Little Help from another person taking care of personal grooming?: A Little Help from  another person toileting, which includes using toliet, bedpan, or urinal?: A Lot Help from another person bathing (including washing, rinsing, drying)?: A Lot Help from another person to put on and taking off regular upper body clothing?: A Little Help from another person to put on and taking off regular lower body clothing?: A Lot 6 Click Score: 15    End of Session Equipment Utilized During Treatment: Gait belt;Rolling walker (2 wheels);Other (comment) (sternal precaution handout provided)  OT Visit Diagnosis: Unsteadiness on feet (R26.81);Repeated falls (R29.6);Muscle weakness (generalized) (M62.81);Pain   Activity Tolerance Patient tolerated treatment well   Patient Left in chair;with call bell/phone within reach;with family/visitor present   Nurse Communication Mobility status        Time: 6301-6010 OT Time Calculation (min): 30 min  Charges: OT General Charges $OT Visit: 1 Visit OT Treatments $Self Care/Home Management : 23-37 mins  [place]. Joe [name], OTR/L Acute Rehabilitation Services [phone]   Joe [name] 08/07/2022, 12:26 PM

## 2022-08-07 NOTE — Progress Notes (Signed)
PT Cancellation Note  Patient Details Name: Joe [name] MRN: [id number] DOB: November 21, 1955   Cancelled Treatment:    Reason Eval/Treat Not Completed: Other (comment).  Pt is up with nursing as PT arrives and will retry at another time.   Ramond Dial 08/07/2022, 2:54 PM  Mee Hives, PT PhD Acute Rehab Dept. Number: [place] and [place]

## 2022-08-08 LAB — BASIC METABOLIC PANEL
Anion gap: 10 (ref 5–15)
BUN: 22 mg/dL (ref 8–23)
CO2: 27 mmol/L (ref 22–32)
Calcium: 9.2 mg/dL (ref 8.9–10.3)
Chloride: 103 mmol/L (ref 98–111)
Creatinine, Ser: 0.77 mg/dL (ref 0.61–1.24)
GFR, Estimated: >60 mL/min (ref >60)
Glucose, Bld: 121 mg/dL — ABNORMAL HIGH (ref 70–99)
Potassium: 4.2 mmol/L (ref 3.5–5.1)
Sodium: 140 mmol/L (ref 135–145)

## 2022-08-08 LAB — MAGNESIUM: Magnesium: 2 mg/dL (ref 1.7–2.4)

## 2022-08-08 NOTE — Progress Notes (Signed)
Mobility Specialist - Progress Note   08/08/22 1623  Mobility  Activity Ambulated with assistance in hallway  Level of Assistance Contact guard assist, steadying assist  Assistive Device Front wheel walker  Distance Ambulated (ft) 80 ft  Activity Response Tolerated fair  Mobility Referral Yes  $Mobility charge 1 Mobility   Pt was received in bed. Pt required max encouragement to ambulate today.No complaints throughout. Pt was returned to bed with all needs met and family present.    Larey Seat

## 2022-08-08 NOTE — Progress Notes (Addendum)
      [address]       [place] [id number]             [phone]        16 Days Post-Op Procedure(s) (LRB): TRANSESOPHAGEAL ECHOCARDIOGRAM (TEE) (N/A) CARDIOVERSION (N/A)  Subjective: Patient had some dizziness previously;orthostatic vitals difficult to obtain. He denies dizziness this am. He continues to have pain on "bottom"-sacral wound  Objective: Vital signs in last 24 hours: Temp:  [98 F (36.7 C)-98.2 F (36.8 C)] 98.2 F (36.8 C) (10/28 0349) Pulse Rate:  [51-60] 56 (10/28 0349) Cardiac Rhythm: Sinus bradycardia (10/28 0405) Resp:  [15-19] 19 (10/28 0349) BP: (138-147)/(61-72) 139/71 (10/28 0349) SpO2:  [93 %-100 %] 94 % (10/28 0349) Weight:  [68.6 kg] 68.6 kg (10/28 0630)  Pre op weight 71.2 kg Current Weight  08/08/22 68.6 kg    Hemodynamic parameters for last 24 hours: CVP:  [7 mmHg-9 mmHg] 8 mmHg  Intake/Output from previous day: 10/27 0701 - 10/28 0700 In: 220 [P.O.:220] Out: 950 [Urine:950]   Physical Exam:  Cardiovascular: RRR, no murmur Pulmonary: Clear to auscultation bilaterally Abdomen: Soft, mildly generalized tenderness, high pitched bowel sounds present. Extremities: No lower extremity edema. Wounds: Clean and dry.  No erythema or signs of infection.  Lab Results: CBC: Recent Labs    08/07/22 0426  WBC 6.4  HGB 10.0*  HCT 29.6*  PLT 290    BMET:  Recent Labs    08/07/22 0426 08/08/22 0450  NA 138 140  K 4.3 4.2  CL 102 103  CO2 28 27  GLUCOSE 93 121*  BUN 24* 22  CREATININE 0.76 0.77  CALCIUM 9.1 9.2     PT/INR:  Lab Results  Component Value Date   INR 1.5 (H) 07/22/2022   INR 1.7 (H) 07/08/2022   INR 1.1 07/07/2022   ABG:  INR: Will add last result for INR, ABG once components are confirmed Will add last 4 CBG results once components are confirmed  Assessment/Plan:  1. CV - Previous junctional rhythm (has epicardial LV lead).Has LBBB. Per EP, will need CRT-D (? At this  hospitalization or wait as he has had prolonged hospitalization). He had a fib/flutter post op as well. Of late, SB, first degree heart block. On Amiodarone 200 mg daily, Apixaban 5 mg bid 2.  Pulmonary - On room air. Encourage incentive spirometer 3. Chronic systolic heart failure- On Spironolactone 12.5 mg daily and Losartan 50 mg daily. Appreciate AHF assistance 4.  Expected post op acute blood loss anemia - H and H slightly increased to 10 and 29.6 5. GU-urinary retention. Continue foley, Flomax 6. RUE DVT-PICC has been replaced. On Apixaban so not on Coumadin etc. 7. Deconditioning-CIR denied. HH PT ordered 8. Likely discharge early next week, if ok with AHF  Donielle M ZimmermanPA-C 8:12 AM   I have personally reviewed the above and agree the above

## 2022-08-09 LAB — BASIC METABOLIC PANEL
Anion gap: 8 (ref 5–15)
BUN: 24 mg/dL — ABNORMAL HIGH (ref 8–23)
CO2: 26 mmol/L (ref 22–32)
Calcium: 9.1 mg/dL (ref 8.9–10.3)
Chloride: 105 mmol/L (ref 98–111)
Creatinine, Ser: 0.65 mg/dL (ref 0.61–1.24)
GFR, Estimated: >60 mL/min (ref >60)
Glucose, Bld: 103 mg/dL — ABNORMAL HIGH (ref 70–99)
Potassium: 4.3 mmol/L (ref 3.5–5.1)
Sodium: 139 mmol/L (ref 135–145)

## 2022-08-09 LAB — MAGNESIUM: Magnesium: 2 mg/dL (ref 1.7–2.4)

## 2022-08-09 NOTE — Progress Notes (Signed)
Nurse informed by mobility tech of pt feeling dizzy while trying to ambulate with a pressure of 154/101. Follow up BP 128/62.

## 2022-08-09 NOTE — Progress Notes (Addendum)
      [address]       [place], [address]             [phone]        17 Days Post-Op Procedure(s) (LRB): TRANSESOPHAGEAL ECHOCARDIOGRAM (TEE) (N/A) CARDIOVERSION (N/A)  Subjective:  He continues to have pain on "bottom"-sacral wound. Mom at bedside feeding him this morning.  Objective: Vital signs in last 24 hours: Temp:  [97.9 F (36.6 C)-98.3 F (36.8 C)] 98 F (36.7 C) (10/29 0354) Pulse Rate:  [49-69] 49 (10/29 0400) Cardiac Rhythm: Sinus bradycardia;Bundle branch block (10/28 2026) Resp:  [12-22] 12 (10/29 0400) BP: (115-159)/(55-76) 145/63 (10/29 0354) SpO2:  [94 %-99 %] 99 % (10/29 0400) Weight:  [68.1 kg] 68.1 kg (10/29 0354)  Pre op weight 71.2 kg Current Weight  08/09/22 68.1 kg    Hemodynamic parameters for last 24 hours: CVP:  [6 mmHg-9 mmHg] 9 mmHg  Intake/Output from previous day: 10/28 0701 - 10/29 0700 In: 270 [P.O.:240; I.V.:30] Out: 1025 [Urine:1025]   Physical Exam:  Cardiovascular: RRR, no murmur Pulmonary: Clear to auscultation bilaterally Abdomen: Soft, mildly generalized tenderness, high pitched bowel sounds present. Extremities: No lower extremity edema. Wounds: Clean and dry.  No erythema or signs of infection.  Lab Results: CBC: Recent Labs    08/07/22 0426  WBC 6.4  HGB 10.0*  HCT 29.6*  PLT 290    BMET:  Recent Labs    08/08/22 0450 08/09/22 0400  NA 140 139  K 4.2 4.3  CL 103 105  CO2 27 26  GLUCOSE 121* 103*  BUN 22 24*  CREATININE 0.77 0.65  CALCIUM 9.2 9.1     PT/INR:  Lab Results  Component Value Date   INR 1.5 (H) 07/22/2022   INR 1.7 (H) 07/08/2022   INR 1.1 07/07/2022   ABG:  INR: Will add last result for INR, ABG once components are confirmed Will add last 4 CBG results once components are confirmed  Assessment/Plan:  1. CV - Previous junctional rhythm (has epicardial LV lead).Has LBBB. Per EP, will need CRT-D (? At this hospitalization or wait as he has had  prolonged hospitalization). He had a fib/flutter post op as well. Of late, SB, first degree heart block. On Amiodarone 200 mg daily, Apixaban 5 mg bid 2.  Pulmonary - On room air. Encourage incentive spirometer 3. Chronic systolic heart failure- On Spironolactone 12.5 mg daily and Losartan 50 mg daily.  4.  Expected post op acute blood loss anemia - H and H slightly increased to 10 and 29.6 5. GU-urinary retention. Continue foley, Flomax 6. RUE DVT-PICC has been replaced. On Apixaban so not on Coumadin etc. 7. Deconditioning-CIR denied. HH PT ordered 8. Likely discharge in the next week, if ok with AHF  Donielle M ZimmermanPA-C 8:19 AM   I have reviewed the above and agree with plan

## 2022-08-09 NOTE — Progress Notes (Signed)
   08/09/22 1300  Mobility  Activity Contraindicated/medical hold;Stood at bedside  Level of Assistance Moderate assist, patient does 50-74%  Assistive Device  (HHA)  Distance Ambulated (ft) 2 ft  Activity Response Tolerated fair  Mobility Referral Yes  $Mobility charge 1 Mobility   Mobility Specialist Progress Note  During Mobility: 154/101BP  Mobility cut short d/t pt feeling dizzy. Returned to bed w/ all needs met and call bell in reach. RN notified.   Lucious Groves Mobility Specialist

## 2022-08-10 DIAGNOSIS — Z95828 Presence of other vascular implants and grafts: Secondary | ICD-10-CM | POA: Diagnosis not present

## 2022-08-10 LAB — BASIC METABOLIC PANEL
Anion gap: 5 (ref 5–15)
BUN: 21 mg/dL (ref 8–23)
CO2: 26 mmol/L (ref 22–32)
Calcium: 9 mg/dL (ref 8.9–10.3)
Chloride: 107 mmol/L (ref 98–111)
Creatinine, Ser: 0.78 mg/dL (ref 0.61–1.24)
GFR, Estimated: >60 mL/min (ref >60)
Glucose, Bld: 87 mg/dL (ref 70–99)
Potassium: 4.4 mmol/L (ref 3.5–5.1)
Sodium: 138 mmol/L (ref 135–145)

## 2022-08-10 LAB — MAGNESIUM: Magnesium: 2 mg/dL (ref 1.7–2.4)

## 2022-08-10 NOTE — Progress Notes (Addendum)
Patient ID: BRINLEY TREANOR, male   DOB: July 05, 1956, 66 y.o.   MRN: [id number]     Advanced Heart Failure Rounding Note  PCP-Cardiologist: Skeet Latch, MD   Subjective:    9/27 s/p Bentall with bioprosthetic aortic valve . Post-op course c/b ileus.   S/p DCCV 10/12--> NSR/SB  BPs more stable. SCr/K normal. Wt stable.   Previously complaining of dizziness with ambulation. Much improved today.  Up in chair being fed by mom. Feels better today and appetite improving. No complaints. Insurance denied CIR. Denies CP and SOB  Objective:   Weight Range: 68.1 kg Body mass index is 23.51 kg/m.   Vital Signs:   Temp:  [97.7 F (36.5 C)-98 F (36.7 C)] 97.7 F (36.5 C) (10/30 0414) Pulse Rate:  [50-56] 51 (10/30 0414) Resp:  [13-17] 14 (10/30 0414) BP: (118-150)/(57-63) 118/57 (10/30 0414) SpO2:  [91 %-99 %] 91 % (10/30 0414) Last BM Date : 08/07/22  Weight change: Filed Weights   08/07/22 0418 08/08/22 0630 08/09/22 0354  Weight: 69.2 kg 68.6 kg 68.1 kg    Intake/Output:   Intake/Output Summary (Last 24 hours) at 08/10/2022 0845 Last data filed at 08/10/2022 0414 Gross per 24 hour  Intake 390 ml  Output 900 ml  Net -510 ml     Physical Exam  CVP 7 General:  well appearing.  No respiratory difficulty HEENT: normal Neck: supple. JVD ~7 cm. Carotids 2+ bilat; no bruits. No lymphadenopathy or thyromegaly appreciated. Cor: PMI nondisplaced. Regular rate & rhythm. No rubs, gallops or murmurs. Lungs: clear, diminished bases Abdomen: soft, nontender, nondistended. No hepatosplenomegaly. No bruits or masses. Good bowel sounds. Extremities: no cyanosis, clubbing, rash, edema  Neuro: alert & oriented x 3, cranial nerves grossly intact. moves all 4 extremities w/o difficulty. Affect pleasant.   Telemetry   NSR 70s (Personally reviewed)    Labs    CBC No results for input(s): "WBC", "NEUTROABS", "HGB", "HCT", "MCV", "PLT" in the last 72 hours.   Basic Metabolic  Panel Recent Labs    08/09/22 0400 08/10/22 0410  NA 139 138  K 4.3 4.4  CL 105 107  CO2 26 26  GLUCOSE 103* 87  BUN 24* 21  CREATININE 0.65 0.78  CALCIUM 9.1 9.0  MG 2.0 2.0   Liver Function Tests No results for input(s): "AST", "ALT", "ALKPHOS", "BILITOT", "PROT", "ALBUMIN" in the last 72 hours.  BNP: BNP (last 3 results) Recent Labs    06/25/22 1829  BNP 436.2*     Imaging  reviewed  Medications:     Scheduled Medications:  amiodarone  200 mg Oral Daily   apixaban  5 mg Oral BID   aspirin EC  81 mg Oral Daily   atorvastatin  80 mg Oral Daily   Chlorhexidine Gluconate Cloth  6 each Topical Daily   leptospermum manuka honey  1 Application Topical Daily   losartan  50 mg Oral Daily   metoCLOPramide (REGLAN) injection  10 mg Intravenous Q8H   pantoprazole  40 mg Oral Daily   polyethylene glycol  17 g Oral Daily   potassium chloride  40 mEq Oral Daily   senna-docusate  2 tablet Oral QHS   sertraline  25 mg Oral Daily   sodium chloride flush  10-40 mL Intracatheter Q12H   sodium chloride flush  3 mL Intravenous Q12H   spironolactone  12.5 mg Oral Daily   tamsulosin  0.4 mg Oral BID    Infusions:  sodium chloride Stopped (07/22/22 1203)  promethazine (PHENERGAN) injection (IM or IVPB)      PRN Medications: sodium chloride, acetaminophen, bisacodyl, diazepam, hydrocortisone, hydrocortisone, melatonin, ondansetron (ZOFRAN) IV, mouth rinse, polyethylene glycol, promethazine (PHENERGAN) injection (IM or IVPB), senna-docusate, sodium chloride flush, sodium chloride flush, traMADol    Patient Profile  66 y/o male recently found to have severe LV dysfunction in setting of markedly dilated aortic root (67 mm) and severe AI (tricuspid AoV) and single vessel CAD, now s/p Bentall and CABG x 1 w/ SVG-RCA. AHF team consulted to assist w/ post-operative management of systolic heart failure.     Assessment/Plan   Ascending Aortic Aneurysm/ Severe AI  - CTA  w/  severe dilation at the sinus of Valsalva, 67 mm. Moderate dilation of mid ascending aorta, 44 mm. Tricuspid AoV - s/p Bentall with bioprosthetic aortic valve 9/27 - intraoperative TEE, no perivalvular leak post repair  - Echo 10/10 EF 25% RV function improving. Moderate to severe MR (in setting of LBBB) AVR ok   - Echo 10/23 EF 20% RV moderately reduced. Moderate to severe PI. AVR ok  - CTA 10/16 negative for dissection.   2. CAD - single vessel, 60-70% pRCA  - s/p CABG x 1, SVG- RCA  - ASA + statin  - No BB for now w/ bradycardia     3. Chronic Systolic Heart Failure  - newly diagnosed; Echo 9/23 EF 25-30%, LV severely dilated, severe AI, Global HK, RV ok  - NICM, severity out of proportion to degree of CAD  - likely valvular CM in setting of severe AI +/- LBBB CM  - now post op bioprosthetic AVR + CABG. Intraoperative TEE LVEF 20-25%, RV ok - With junctional rhythm, will hold off on beta blocker and digoxin for now to avoid worsening A-V conduction. Eventual CRT-D (see below).  - Hypotensive 10/04 in setting of urinary retention.  - Echo 10/9- EF 25% RV function improving. Moderate to severe MR (in setting of LBBB) AVR ok   - Echo 10/23-  EF 20% RV moderately reduced. Moderate to severe PI. AVR ok  - Volume status stable on exam. CVP 7 - Continue Losartan  50 mg daily  - Continue spiro 12.5 mg daily - Now off midodrine.  - would avoid SGLT2i given urinary retention/foley   4. Junctional rhythm - Resolved  - Has epicardial LV lead. EP following. Had been considering CRT-D prior to discharge but given need for DC-CV and prolonged hospitalization likely better wait at least several weeks until he recovers   5. LBBB -Will eventually need epicardial lead hooked up for CRT see discussion above   6. Anemia - Post-op, 1 u RBCs 10/04. - Last CBC 10/23 Hgb 10  7. Thrombocytopenia - Resolved.  8. Atrial fib/flutter, post-op - s/p DCCV 10/12>>NSR  - Back in A fib 10/16 -  Maintaining SB/SR--rate upper 50s-60s - Continue Amiodarone 200 mg daily  - Eliquis 5 mg bid   9. Urinary retention/UTI  - Followed by [facility] every 6 months and has been on flomax for some time.  - Recurrent bladder distention. CT abd--> 1600 cc urine.  Foley replaced with 1300 cc out.  - UA +. Completed course of Rocephin - Continues w/ foley.    10. Post-op ileus/ Constipation  - 10/9 KUB c/w ileus  - Resolved  11. Hypokalemia/Hypomagnesemia  - Resolved - Keep K > 4 and Mag > 2  12. Hyponatremia - Resolved  13. Back Pain  - CTA 10/16 negative for dissection  -  suspect 2/2 bladder distention   14. RUE Pain/ PICC Line Associated DVT  - [place] + for Acute nonocclusive DVT surrounding the picc line in the subclavian vein - PICC replaced - Already on Eliquis for Afib  15. Situational Depression  - started sertraline 10/18   16. Deconditioning - CIR denied by insurance - PT recommending [facility]  17. Dizziness - dizziness when ambulating - orthostatics obtained 08/07/22 lying 148>83, sitting 152/93, standing 185/119. Difficult to access with PICC in one arm and restricted extremity  - much improved  18. Disposition - Insurance denied CIR - Mom hypertensive to 200s this weekend, went home to rest and realized she can't take her of him by herself. Looking into rehab facilities now.  - Stable from AHF standpoint for d/c  Length of Stay: [place] AGACNP-BC  8:45 AM  Patient seen and examined with the above-signed Advanced Practice Provider and/or Housestaff. I personally reviewed laboratory data, imaging studies and relevant notes. I independently examined the patient and formulated the important aspects of the plan. I have edited the note to reflect any of my changes or salient points. I have personally discussed the plan with the patient and/or family.  Remains weak. Denies SOB or CP. Dizziness improved. BP stable.   General:  Weak appearing. No resp  difficulty HEENT: normal Neck: supple. no JVD. Carotids 2+ bilat; no bruits. No lymphadenopathy or thryomegaly appreciated. Cor: PMI nondisplaced. Regular rate & rhythm. No rubs, gallops or murmurs. Lungs: clear Abdomen: soft, nontender, nondistended. No hepatosplenomegaly. No bruits or masses. Good bowel sounds. Extremities: no cyanosis, clubbing, rash, edema Neuro: alert & orientedx3, cranial nerves grossly intact. moves all 4 extremities w/o difficulty. Affect pleasant  Stable from cardiac standpoint. Vitals improved. On decent GDMT. Remains in NSR. Tolerating Eliquis.   He is ready to d/c to SNF from my standpoint.   Glori Bickers, MD  1:00 PM

## 2022-08-10 NOTE — Progress Notes (Signed)
      [address]       [place] [address]             [phone]       18 Days Post-Op Procedure(s) (LRB): TRANSESOPHAGEAL ECHOCARDIOGRAM (TEE) (N/A) CARDIOVERSION (N/A) Subjective: Patient states he feels good today and his bottom feels okay. Brighter affect today. Continues with dizziness during ambulation.  Objective: Vital signs in last 24 hours: Temp:  [97.7 F (36.5 C)-98 F (36.7 C)] 97.7 F (36.5 C) (10/30 0414) Pulse Rate:  [50-56] 51 (10/30 0414) Cardiac Rhythm: Sinus bradycardia;Bundle branch block (10/29 1900) Resp:  [13-17] 14 (10/30 0414) BP: (118-150)/(57-63) 118/57 (10/30 0414) SpO2:  [91 %-99 %] 91 % (10/30 0414)  Hemodynamic parameters for last 24 hours: CVP:  [5 mmHg-28 mmHg] 28 mmHg  Intake/Output from previous day: 10/29 0701 - 10/30 0700 In: 390 [P.O.:360; I.V.:30] Out: 900 [Urine:900] Intake/Output this shift: No intake/output data recorded.  General appearance: alert, cooperative, and no distress Neurologic: intact Heart: regular rate and rhythm, S1, S2 normal, no murmur, click, rub or gallop Lungs: clear to auscultation bilaterally Abdomen: soft, non-tender; bowel sounds normal; no masses,  no organomegaly Extremities: extremities normal, atraumatic, no cyanosis or edema Wound: Sacral wound with clean, dry dressing. Other wounds clean, dry and intact  Lab Results: No results for input(s): "WBC", "HGB", "HCT", "PLT" in the last 72 hours. BMET:  Recent Labs    08/09/22 0400 08/10/22 0410  NA 139 138  K 4.3 4.4  CL 105 107  CO2 26 26  GLUCOSE 103* 87  BUN 24* 21  CREATININE 0.65 0.78  CALCIUM 9.1 9.0    PT/INR: No results for input(s): "LABPROT", "INR" in the last 72 hours. ABG    Component Value Date/Time   PHART 7.342 (L) 07/10/2022 0546   HCO3 23.4 07/10/2022 0546   TCO2 30 07/16/2022 0711   ACIDBASEDEF 2.0 07/10/2022 0546   O2SAT 70.3 08/04/2022 0922   CBG (last 3)  No results for input(s): "GLUCAP"  in the last 72 hours.  Assessment/Plan: S/P Procedure(s) (LRB): TRANSESOPHAGEAL ECHOCARDIOGRAM (TEE) (N/A) CARDIOVERSION (N/A)  CV: Sinus bradycardia, rate 52 this AM. 1st degree heart block. Labile BP, systolic BP 622-297. Previous junctional rhythm-per EP CRT-D as an outpatient. A fib/aflutter resolved with DC-CV, continues on '200mg'$  amiodarone BID. Chronic systolic heart failure on 12.'5mg'$  spironolactone and '50mg'$  cozaar-appreciate AHF assistance.  Pulm: Saturating 91-99% on RA. Continue IS and ambulation. GI: Hx of postop ileus resolved. LBM 10/27. Renal: Urinary retention, foley catheter in place. On flomax. Will discharge with foley catheter and outpatient urology follow up. Wounds: full thickness unstageable gluteal cleft wound. Continue dressing changes.  RUE DVT: Resolved, on apixaban '5mg'$  BID Dispo: D/C home with home health next 24-48 hours if dizziness improves and ok with AHF.    LOS: 33 days    Magdalene River, PA-C 08/10/2022

## 2022-08-10 NOTE — Progress Notes (Signed)
Mobility Specialist Progress Note:   08/10/22 1342  Mobility  Activity Refused mobility   Pt in bed waiting for dressing change. Will follow-up as time allows.   [facility] Surveyor, mining Chat only

## 2022-08-10 NOTE — Progress Notes (Signed)
Physical Therapy Treatment Patient Details Name: Joe [name] MRN: [id number] DOB: Oct 17, 1955 Today's Date: 08/10/2022   History of Present Illness 66 yo male admitted 07/08/22 for ascending aortic arch replacement with Bentall procedure and CABGx1 same date. Extubated 9/28. Ileus, Afib and CHF post procedure. Hosptial stay continue to be complicated by back pain and UTI. PMHx: congenital brain damage, speech delay, bil THA, PLIF, Lt TSA, PVC    PT Comments    Pt progressing well towards his physical therapy goals, demonstrating improved activity tolerance and ambulation distance. Pt mother voicing some concerns that he did not move much over weekend. Pt able to participate in seated warm up exercises and ambulating 120 ft with a walker at a min guard assist level. HR 55-77, SpO2 94-96% on RA, BP listed below. Pt able to follow sternal precautions with verbal cueing. Will continue to progress as tolerated.  BP (unsure of accuracy due to leg cuff): Supine: 135/60 (81) Sitting pre mobility: 168/103 (122) Sitting post mobility: 183/112 (129)    Recommendations for follow up therapy are one component of a multi-disciplinary discharge planning process, led by the attending physician.  Recommendations may be updated based on patient status, additional functional criteria and insurance authorization.  Follow Up Recommendations  Home health PT     Assistance Recommended at Discharge Frequent or constant Supervision/Assistance  Patient can return home with the following A little help with walking and/or transfers;A little help with bathing/dressing/bathroom;Help with stairs or ramp for entrance;Assist for transportation;Assistance with feeding   Equipment Recommendations  None recommended by PT    Recommendations for Other Services       Precautions / Restrictions Precautions Precautions: Sternal;Fall Restrictions Weight Bearing Restrictions: Yes (sternal precautions)     Mobility   Bed Mobility Overal bed mobility: Needs Assistance Bed Mobility: Rolling, Sidelying to Sit Rolling: Supervision   Supine to sit: Min guard     General bed mobility comments: Increased time and step by step cueing    Transfers Overall transfer level: Needs assistance Equipment used: Rolling walker (2 wheels) Transfers: Sit to/from Stand Sit to Stand: Min guard           General transfer comment: Cues for sternal precautions, pushing up from knees    Ambulation/Gait Ambulation/Gait assistance: Min guard Gait Distance (Feet): 120 Feet Assistive device: Rolling walker (2 wheels) Gait Pattern/deviations: Step-through pattern, Decreased stride length, Decreased dorsiflexion - left Gait velocity: decreased     General Gait Details: Cues for larger step lengths and wider BOS, min guard for safety   Stairs             Wheelchair Mobility    Modified Rankin (Stroke Patients Only)       Balance Overall balance assessment: Needs assistance Sitting-balance support: Feet supported, No upper extremity supported Sitting balance-Leahy Scale: Good     Standing balance support: Bilateral upper extremity supported, During functional activity Standing balance-Leahy Scale: Poor Standing balance comment: reliant on RW                            Cognition Arousal/Alertness: Awake/alert Behavior During Therapy: Flat affect Overall Cognitive Status: History of cognitive impairments - at baseline                                 General Comments: Pt mother encouraging him to respond to questions  Exercises General Exercises - Lower Extremity Ankle Circles/Pumps: Both, 10 reps, Seated Long Arc Quad: Both, 10 reps, Seated Hip Flexion/Marching: Both, 10 reps, Seated    General Comments        Pertinent Vitals/Pain Pain Assessment Pain Assessment: Faces Faces Pain Scale: Hurts a little bit Pain Location: bottom Pain Descriptors /  Indicators: Grimacing, Discomfort Pain Intervention(s): Monitored during session    Home Living                          Prior Function            PT Goals (current goals can now be found in the care plan section) Acute Rehab PT Goals Patient Stated Goal: home Time For Goal Achievement: 08/24/22 Potential to Achieve Goals: Good Progress towards PT goals: Progressing toward goals    Frequency    Min 3X/week      PT Plan Current plan remains appropriate    Co-evaluation              AM-PAC PT "6 Clicks" Mobility   Outcome Measure  Help needed turning from your back to your side while in a flat bed without using bedrails?: A Little Help needed moving from lying on your back to sitting on the side of a flat bed without using bedrails?: A Little Help needed moving to and from a bed to a chair (including a wheelchair)?: A Little Help needed standing up from a chair using your arms (e.g., wheelchair or bedside chair)?: A Little Help needed to walk in hospital room?: A Little Help needed climbing 3-5 steps with a railing? : A Little 6 Click Score: 18    End of Session Equipment Utilized During Treatment: Gait belt Activity Tolerance: Patient tolerated treatment well Patient left: in chair;with call bell/phone within reach;with family/visitor present   PT Visit Diagnosis: Other abnormalities of gait and mobility (R26.89);Muscle weakness (generalized) (M62.81);Unsteadiness on feet (R26.81)     Time: 9371-6967 PT Time Calculation (min) (ACUTE ONLY): 27 min  Charges:  $Gait Training: 8-22 mins $Therapeutic Activity: 8-22 mins                     Joe [name], PT, DPT Acute Rehabilitation Services Office [phone]    Joe [name] 08/10/2022, 8:54 AM

## 2022-08-10 NOTE — TOC Progression Note (Signed)
Transition of Care (TOC) - Progression Note  Marvetta Gibbons RN, BSN Transitions of Care Unit 4E- RN Case Manager See Treatment Team for direct phone #   Patient Details  Name: Joe [name] MRN: [id number] Date of Birth: Feb 22, 1956  Transition of Care [facility]) CM/SW Contact  Dahlia Client Romeo Rabon, RN Phone Number: 08/10/2022, 3:20 PM  Clinical Narrative:    Spoke with pt's mother at bedside to discuss transition plan and needs.  Per mom she is planning on taking pt home, understands rehab is not an option (insurance denied CIR, and recommendations for HH at this time- so insurance not likely to approve SNF).  HH orders have been placed RN/PT/OT/aide- and mother confirms choice for agency as [facility]- per previous CM note- referral has been accepted.   Mom voiced that she is working on making arrangements for assistance at home w/ private duty.  She anticipates that this will be set up for Thursday but is going to check with them to see if staffing can be available for Wednesday evening as an option if pt is medically stable for discharge in the next 24-48 hr.  Mom will let staff know timing for these arrangements.  Once these arrangements confirmed mom will be prepared to take pt home, plans to transport home in private vehicle with assistance from the hired private duty.   No DME needs per mom,   Mom is asking about f[facility] with [facility]- no appointment noted on AVS- will f/u with attending team.    Expected Discharge Plan: [place] Barriers to Discharge: Continued Medical Work up  Expected Discharge Plan and Services Expected Discharge Plan: [place] Choice: [place]: RN, OT, PT, Nurse's Aide [facility] Agency: [place] ([place]) Date HH Agency Contacted: 08/07/22 Time [place]: 1559 Representative spoke with at [place]: [place] ([place]) Interventions    Readmission Risk Interventions     No data to display

## 2022-08-11 ENCOUNTER — Inpatient Hospital Stay (HOSPITAL_COMMUNITY): Payer: Medicare Other

## 2022-08-11 DIAGNOSIS — Z95828 Presence of other vascular implants and grafts: Secondary | ICD-10-CM | POA: Diagnosis not present

## 2022-08-11 LAB — BASIC METABOLIC PANEL
Anion gap: 6 (ref 5–15)
BUN: 24 mg/dL — ABNORMAL HIGH (ref 8–23)
CO2: 26 mmol/L (ref 22–32)
Calcium: 9 mg/dL (ref 8.9–10.3)
Chloride: 106 mmol/L (ref 98–111)
Creatinine, Ser: 0.77 mg/dL (ref 0.61–1.24)
GFR, Estimated: >60 mL/min (ref >60)
Glucose, Bld: 89 mg/dL (ref 70–99)
Potassium: 4.2 mmol/L (ref 3.5–5.1)
Sodium: 138 mmol/L (ref 135–145)

## 2022-08-11 LAB — CBC
HCT: 31.6 % — ABNORMAL LOW (ref 39.0–52.0)
Hemoglobin: 10.3 g/dL — ABNORMAL LOW (ref 13.0–17.0)
MCH: 32.4 pg (ref 26.0–34.0)
MCHC: 32.6 g/dL (ref 30.0–36.0)
MCV: 99.4 fL (ref 80.0–100.0)
Platelets: 259 10*3/uL (ref 150–400)
RBC: 3.18 MIL/uL — ABNORMAL LOW (ref 4.22–5.81)
RDW: 15.9 % — ABNORMAL HIGH (ref 11.5–15.5)
WBC: 6.4 10*3/uL (ref 4.0–10.5)
nRBC: 0 % (ref 0.0–0.2)

## 2022-08-11 LAB — MAGNESIUM: Magnesium: 2 mg/dL (ref 1.7–2.4)

## 2022-08-11 MED ORDER — SACUBITRIL-VALSARTAN 24-26 MG PO TABS
1.0000 | ORAL_TABLET | Freq: Two times a day (BID) | ORAL | Status: DC
  Administered 2022-08-11: 1 via ORAL
  Filled 2022-08-11: qty 1

## 2022-08-11 MED ORDER — FLEET ENEMA 7-19 GM/118ML RE ENEM
1.0000 | ENEMA | Freq: Once | RECTAL | Status: AC
  Administered 2022-08-11: 1 via RECTAL
  Filled 2022-08-11: qty 1

## 2022-08-11 NOTE — Progress Notes (Signed)
CM spoke with mom- Joe [name] and she states she was unable to get any staffing for Wednesday but has arrangements in place for the private duty to start on Thursday and will be prepared to take patient home then.  CM was able to contact [facility] and first available appointment made for Nov. 16- mom updated on this. She can call post discharge to clinic and see if pt can be placed on wait list for any cancellation appointments- mom voiced understanding.

## 2022-08-11 NOTE — Progress Notes (Addendum)
Patient ID: METRO EDENFIELD, male   DOB: 1956-10-12, 66 y.o.   MRN: [id number]     Advanced Heart Failure Rounding Note  PCP-Cardiologist: Skeet Latch, MD   Subjective:    9/27 s/p Bentall with bioprosthetic aortic valve . Post-op course c/b ileus.   S/p DCCV 10/12--> NSR/SB  BPs remains labile, 353I-144R systolic. SB upper 50s.   Complaining of abdominal pain, no BM since 10/27. Primary team has ordered KUB.  Ate breakfast this morning, tolerated well. No n/v.  Denies dyspnea. Still weak w/ activity. Working w/ PT daily.   Mother at bedside   Objective:   Weight Range: 68.1 kg Body mass index is 23.51 kg/m.   Vital Signs:   Temp:  [97.5 F (36.4 C)-98.7 F (37.1 C)] 97.9 F (36.6 C) (10/31 0735) Pulse Rate:  [50-62] 52 (10/31 0735) Resp:  [14-18] 18 (10/31 0735) BP: (113-155)/(62-72) 149/66 (10/31 0735) SpO2:  [92 %-96 %] 94 % (10/31 0735) Last BM Date : 08/07/22  Weight change: Filed Weights   08/07/22 0418 08/08/22 0630 08/09/22 0354  Weight: 69.2 kg 68.6 kg 68.1 kg    Intake/Output:   Intake/Output Summary (Last 24 hours) at 08/11/2022 0832 Last data filed at 08/11/2022 0352 Gross per 24 hour  Intake --  Output 1550 ml  Net -1550 ml     Physical Exam   General:  fatigued/ depressed appearing.  No respiratory difficulty HEENT: normal Neck: supple. JVD ~7 cm. Carotids 2+ bilat; no bruits. No lymphadenopathy or thyromegaly appreciated. Cor: PMI nondisplaced. Regular rate & rhythm. No rubs, gallops or murmurs. Lungs: CTAB  Abdomen: soft, nontender, nondistended. No hepatosplenomegaly. No bruits or masses. Good bowel sounds. Extremities: no cyanosis, clubbing, rash, no edema  Neuro: alert & oriented x 3, cranial nerves grossly intact. moves all 4 extremities w/o difficulty. Affect pleasant.   Telemetry   Sinus bradycardia upper 50s (Personally reviewed)    Labs    CBC No results for input(s): "WBC", "NEUTROABS", "HGB", "HCT", "MCV", "PLT" in  the last 72 hours.   Basic Metabolic Panel Recent Labs    08/10/22 0410 08/11/22 0315  NA 138 138  K 4.4 4.2  CL 107 106  CO2 26 26  GLUCOSE 87 89  BUN 21 24*  CREATININE 0.78 0.77  CALCIUM 9.0 9.0  MG 2.0 2.0   Liver Function Tests No results for input(s): "AST", "ALT", "ALKPHOS", "BILITOT", "PROT", "ALBUMIN" in the last 72 hours.  BNP: BNP (last 3 results) Recent Labs    06/25/22 1829  BNP 436.2*     Imaging  reviewed  Medications:     Scheduled Medications:  amiodarone  200 mg Oral Daily   apixaban  5 mg Oral BID   aspirin EC  81 mg Oral Daily   atorvastatin  80 mg Oral Daily   Chlorhexidine Gluconate Cloth  6 each Topical Daily   leptospermum manuka honey  1 Application Topical Daily   losartan  50 mg Oral Daily   metoCLOPramide (REGLAN) injection  10 mg Intravenous Q8H   pantoprazole  40 mg Oral Daily   polyethylene glycol  17 g Oral Daily   potassium chloride  40 mEq Oral Daily   senna-docusate  2 tablet Oral QHS   sertraline  25 mg Oral Daily   sodium chloride flush  10-40 mL Intracatheter Q12H   sodium chloride flush  3 mL Intravenous Q12H   spironolactone  12.5 mg Oral Daily   tamsulosin  0.4 mg Oral BID  Infusions:  sodium chloride Stopped (07/22/22 1203)   promethazine (PHENERGAN) injection (IM or IVPB)      PRN Medications: sodium chloride, acetaminophen, bisacodyl, diazepam, hydrocortisone, hydrocortisone, melatonin, ondansetron (ZOFRAN) IV, mouth rinse, promethazine (PHENERGAN) injection (IM or IVPB), senna-docusate, sodium chloride flush, sodium chloride flush, traMADol    Patient Profile  66 y/o male recently found to have severe LV dysfunction in setting of markedly dilated aortic root (67 mm) and severe AI (tricuspid AoV) and single vessel CAD, now s/p Bentall and CABG x 1 w/ SVG-RCA. AHF team consulted to assist w/ post-operative management of systolic heart failure.     Assessment/Plan   Ascending Aortic Aneurysm/ Severe AI   - CTA  w/ severe dilation at the sinus of Valsalva, 67 mm. Moderate dilation of mid ascending aorta, 44 mm. Tricuspid AoV - s/p Bentall with bioprosthetic aortic valve 9/27 - intraoperative TEE, no perivalvular leak post repair  - Echo 10/10 EF 25% RV function improving. Moderate to severe MR (in setting of LBBB) AVR ok   - Echo 10/23 EF 20% RV moderately reduced. Moderate to severe PI. AVR ok  - CTA 10/16 negative for dissection.   2. CAD - single vessel, 60-70% pRCA  - s/p CABG x 1, SVG- RCA  - ASA + statin  - No BB for now w/ bradycardia     3. Chronic Systolic Heart Failure  - newly diagnosed; Echo 9/23 EF 25-30%, LV severely dilated, severe AI, Global HK, RV ok  - NICM, severity out of proportion to degree of CAD  - likely valvular CM in setting of severe AI +/- LBBB CM  - now post op bioprosthetic AVR + CABG. Intraoperative TEE LVEF 20-25%, RV ok - With junctional rhythm, will hold off on beta blocker and digoxin for now to avoid worsening A-V conduction. Eventual CRT-D (see below).  - Hypotensive 10/04 in setting of urinary retention.  - Echo 10/9- EF 25% RV function improving. Moderate to severe MR (in setting of LBBB) AVR ok   - Echo 10/23-  EF 20% RV moderately reduced. Moderate to severe PI. AVR ok  - Volume status stable on exam.  - Continue Losartan  50 mg daily  - Continue spiro 12.5 mg daily - Now off midodrine.  - would avoid SGLT2i given urinary retention/foley   4. Junctional rhythm - Resolved  - Has epicardial LV lead. EP following. Had been considering CRT-D prior to discharge but given need for DC-CV and prolonged hospitalization likely better wait at least several weeks until he recovers   5. LBBB -Will eventually need epicardial lead hooked up for CRT see discussion above   6. Anemia - Post-op, 1 u RBCs 10/04. - Last CBC 10/23 Hgb 10  7. Thrombocytopenia - Resolved.  8. Atrial fib/flutter, post-op - s/p DCCV 10/12>>NSR  - Back in A fib 10/16 -  Maintaining SB/SR--rate upper 50s-60s - Continue Amiodarone 200 mg daily  - Eliquis 5 mg bid   9. Urinary retention/UTI  - Followed by [facility] every 6 months and has been on flomax for some time.  - Recurrent bladder distention. CT abd--> 1600 cc urine.  Foley replaced with 1300 cc out.  - UA +. Completed course of Rocephin - Continues w/ foley.    10. Post-op ileus/ Constipation  - 10/9 KUB c/w ileus  - ileus resolved, c/w intermittent constipation (chronic issue)  11. Hypokalemia/Hypomagnesemia  - Resolved - Keep K > 4 and Mag > 2  12. Hyponatremia - Resolved  13. Back Pain  - CTA 10/16 negative for dissection  - suspect 2/2 bladder distention   14. RUE Pain/ PICC Line Associated DVT  - [place] + for Acute nonocclusive DVT surrounding the picc line in the subclavian vein - PICC replaced - Already on Eliquis for Afib  15. Situational Depression  - started sertraline 10/18   16. Deconditioning - CIR denied by insurance - PT recommending [facility]  17. Dizziness - dizziness when ambulating - orthostatics obtained 08/07/22 lying 148>83, sitting 152/93, standing 185/119. Difficult to access with PICC in one arm and restricted extremity  - much improved  18. Disposition - Insurance denied CIR - Mom hypertensive to 200s this weekend, went home to rest and realized she can't take care of him by herself. Looking into rehab facilities now. ? Status, checking w/ TOC - Stable from AHF standpoint for d/c  Length of Stay: [place] PA-C  8:32 AM  Patient seen and examined with the above-signed Advanced Practice Provider and/or Housestaff. I personally reviewed laboratory data, imaging studies and relevant notes. I independently examined the patient and formulated the important aspects of the plan. I have edited the note to reflect any of my changes or salient points. I have personally discussed the plan with the patient and/or family.  Denies CP or SOB.  Dizziness improved. BP running high.    General:  Well appearing. No resp difficulty HEENT: normal Neck: supple. no JVD. Carotids 2+ bilat; no bruits. No lymphadenopathy or thryomegaly appreciated. Cor: PMI nondisplaced. Regular rate & rhythm. No rubs, gallops or murmurs. Lungs: clear Abdomen: soft, nontender, nondistended. No hepatosplenomegaly. No bruits or masses. Good bowel sounds. Extremities: no cyanosis, clubbing, rash, edema Neuro: alert & orientedx3, cranial nerves grossly intact. moves all 4 extremities w/o difficulty. Affect pleasant  Continues to progress. BP running high. Was on Entresto prior to admit. Will switch losartan back to Entresto. Volume status and rhythm stable. Hopefully ready for d/c in next day or two when [facility] services set up.   Glori Bickers, MD  1:39 PM

## 2022-08-11 NOTE — Progress Notes (Signed)
Mobility Specialist Progress Note:   08/11/22 1353  Mobility  Activity Ambulated with assistance in hallway  Level of Assistance Minimal assist, patient does 75% or more  Assistive Device Front wheel walker  Distance Ambulated (ft) 230 ft  Activity Response Tolerated well  $Mobility charge 1 Mobility   Pt received in chair willing to participate in mobility. No complaints of pain. MinA to stand from chair. Left in bed with call bell in reach and all needs met.   [facility] Surveyor, mining Chat only

## 2022-08-11 NOTE — Progress Notes (Addendum)
      [address]       [place] [address]             [phone]      19 Days Post-Op Procedure(s) (LRB): TRANSESOPHAGEAL ECHOCARDIOGRAM (TEE) (N/A) CARDIOVERSION (N/A) Subjective: Patient states his belly hurts this morning  Objective: Vital signs in last 24 hours: Temp:  [97.5 F (36.4 C)-98.7 F (37.1 C)] 97.9 F (36.6 C) (10/31 0735) Pulse Rate:  [50-62] 52 (10/31 0735) Cardiac Rhythm: Heart block;Bundle branch block (10/30 1900) Resp:  [14-18] 18 (10/31 0735) BP: (113-155)/(62-72) 149/66 (10/31 0735) SpO2:  [92 %-96 %] 94 % (10/31 0735)  Hemodynamic parameters for last 24 hours: CVP:  [6 mmHg-7 mmHg] 6 mmHg  Intake/Output from previous day: 10/30 0701 - 10/31 0700 In: -  Out: 7416 [Urine:1550] Intake/Output this shift: No intake/output data recorded.  General appearance: alert, cooperative, and no distress Neurologic: intact Heart: regular rate and rhythm, S1, S2 normal, no murmur, click, rub or gallop Lungs: clear to auscultation bilaterally Abdomen: +bowel sounds, tenderness, distension Extremities: extremities normal, atraumatic, no cyanosis or edema Wound: Full thickness sacral wound, other wounds clean dry and intact  Lab Results: No results for input(s): "WBC", "HGB", "HCT", "PLT" in the last 72 hours. BMET:  Recent Labs    08/10/22 0410 08/11/22 0315  NA 138 138  K 4.4 4.2  CL 107 106  CO2 26 26  GLUCOSE 87 89  BUN 21 24*  CREATININE 0.78 0.77  CALCIUM 9.0 9.0    PT/INR: No results for input(s): "LABPROT", "INR" in the last 72 hours. ABG    Component Value Date/Time   PHART 7.342 (L) 07/10/2022 0546   HCO3 23.4 07/10/2022 0546   TCO2 30 07/16/2022 0711   ACIDBASEDEF 2.0 07/10/2022 0546   O2SAT 70.3 08/04/2022 0922   CBG (last 3)  No results for input(s): "GLUCAP" in the last 72 hours.  Assessment/Plan: S/P Procedure(s) (LRB): TRANSESOPHAGEAL ECHOCARDIOGRAM (TEE) (N/A) CARDIOVERSION (N/A)  CV: hx of  junctional rhythm, per EP with get CRT-D as an outpatient. Hx of afib/aflutter resolved with DC-CV, continues on amiodarone '200mg'$  QD. Chronic systolic heart failure, on spironolactone 12.'5mg'$  QD and cozaar '50mg'$  QD...appreciate AHF assistance with GDMT. Sinus bradycardia rate 53 this AM. Labile BP, SBP 149 this AM. Pulm: Saturating 94% on RA, continue IS and ambulation GI: Hx of postop ileus. No bowel movement since 10/27 when he was having loose stools, complaining of abdominal pain, tenderness and distension noted on exam. On miralax and senna. Will get KUB. Renal: Hx of urinary retention, catheter in place. UO 1550cc/24hrs. On flomax. Cr stable 0.77. Will d/c with foley and outpatient urology appt.  ID: No CBC since 10/27 will get CBC DVT: Resolved, on Eliquis '5mg'$  BID Deconditioning: Dizziness improving, continue ambulation and work with PT/OT.  Dispo: Mom will have help at home with home health PT/OT on Wednesday or Thursday. Will likely discharge in 48 hours.   LOS: 34 days    Magdalene River, PA-C 08/11/2022  Agree with above assessment and plan

## 2022-08-11 NOTE — Progress Notes (Signed)
Occupational Therapy Treatment Patient Details Name: Joe [name] MRN: [id number] DOB: 1956-01-24 Today's Date: 08/11/2022   History of present illness 66 yo male admitted 07/08/22 for ascending aortic arch replacement with Bentall procedure and CABGx1 same date. Extubated 9/28. Ileus, Afib and CHF post procedure. Hosptial stay continue to be complicated by back pain and UTI. PMHx: congenital brain damage, speech delay, bil THA, PLIF, Lt TSA, PVC   OT comments  Patient seated in recliner upon entry, agreeable to OT session.  Reviewed sternal precautions with pt, pts mom assisting in recall.  Pt completing transfers with min assist to power up and initiate today, anticipate internally distracted by abdominal cramping today. Functional mobility in room with min guard using RW.  He requires cueing for hand placement and safety.  Max assist for LB dressing and mod assist to return back to bed. Limited session as transport arrived.  Will follow acutely.    Recommendations for follow up therapy are one component of a multi-disciplinary discharge planning process, led by the attending physician.  Recommendations may be updated based on patient status, additional functional criteria and insurance authorization.    Follow Up Recommendations  Home health OT (aide)    Assistance Recommended at Discharge Frequent or constant Supervision/Assistance  Patient can return home with the following  A little help with walking and/or transfers;A lot of help with bathing/dressing/bathroom;Assistance with cooking/housework;Direct supervision/assist for medications management;Direct supervision/assist for financial management;Help with stairs or ramp for entrance;Assist for transportation   Equipment Recommendations  None recommended by OT    Recommendations for Other Services      Precautions / Restrictions Precautions Precautions: Sternal;Fall Restrictions Weight Bearing Restrictions: Yes Other  Position/Activity Restrictions: sternal precautions       Mobility Bed Mobility Overal bed mobility: Needs Assistance Bed Mobility: Rolling, Sit to Sidelying Rolling: Supervision       Sit to sidelying: Mod assist General bed mobility comments: assist for BLEs back to sidelying, increased time and cueing required for technique    Transfers Overall transfer level: Needs assistance Equipment used: Rolling walker (2 wheels) Transfers: Sit to/from Stand Sit to Stand: Min assist           General transfer comment: cueing for sternal precautions and hand placement, assist to initate and fully power up to standing     Balance Overall balance assessment: Needs assistance Sitting-balance support: No upper extremity supported, Feet supported Sitting balance-Leahy Scale: Good     Standing balance support: Bilateral upper extremity supported, During functional activity Standing balance-Leahy Scale: Poor Standing balance comment: reliant on RW                           ADL either performed or assessed with clinical judgement   ADL Overall ADL's : Needs assistance/impaired                     Lower Body Dressing: Maximal assistance;Sit to/from stand Lower Body Dressing Details (indicate cue type and reason): assist for socks, min assist sit to stand Toilet Transfer: Minimal assistance;Ambulation;Rolling walker (2 wheels) Toilet Transfer Details (indicate cue type and reason): simulated in room, cueing for hand placement and physical assist to power up         Functional mobility during ADLs: Min guard;Rolling walker (2 wheels)      Extremity/Trunk Assessment              Vision  Perception     Praxis      Cognition Arousal/Alertness: Awake/alert Behavior During Therapy: Flat affect Overall Cognitive Status: History of cognitive impairments - at baseline Area of Impairment: Attention, Following commands, Problem solving,  Safety/judgement, Memory                   Current Attention Level: Sustained Memory: Decreased recall of precautions, Decreased short-term memory Following Commands: Follows one step commands consistently, Follows one step commands with increased time, Follows multi-step commands inconsistently Safety/Judgement: Decreased awareness of safety   Problem Solving: Slow processing, Decreased initiation, Requires verbal cues, Difficulty sequencing General Comments: pt continues to require cueing for sternal precautions, hand placement during transfers. internally distracted today by stomach cramping        Exercises      Shoulder Instructions       General Comments VSS on RA, reports cramping- limited session as transport arrived for xray    Pertinent Vitals/ Pain       Pain Assessment Pain Assessment: Faces Faces Pain Scale: Hurts a little bit Pain Location: bottom Pain Descriptors / Indicators: Grimacing, Discomfort Pain Intervention(s): Monitored during session, Repositioned  Home Living                                          Prior Functioning/Environment              Frequency  Min 2X/week        Progress Toward Goals  OT Goals(current goals can now be found in the care plan section)  Progress towards OT goals: Progressing toward goals  Acute Rehab OT Goals Patient Stated Goal: get better OT Goal Formulation: With patient Time For Goal Achievement: 08/18/22 Potential to Achieve Goals: [place] Discharge plan remains appropriate;Frequency remains appropriate    Co-evaluation                 AM-PAC OT "6 Clicks" Daily Activity     Outcome Measure   Help from another person eating meals?: A Little Help from another person taking care of personal grooming?: A Little Help from another person toileting, which includes using toliet, bedpan, or urinal?: A Lot Help from another person bathing (including washing, rinsing,  drying)?: A Lot Help from another person to put on and taking off regular upper body clothing?: A Little Help from another person to put on and taking off regular lower body clothing?: A Lot 6 Click Score: 15    End of Session Equipment Utilized During Treatment: Gait belt;Rolling walker (2 wheels)  OT Visit Diagnosis: Unsteadiness on feet (R26.81);Repeated falls (R29.6);Muscle weakness (generalized) (M62.81);Pain Pain - part of body:  (stomach)   Activity Tolerance Patient tolerated treatment well   Patient Left with call bell/phone within reach;in bed;with family/visitor present   Nurse Communication Mobility status        Time: 1000-1017 OT Time Calculation (min): 17 min  Charges: OT General Charges $OT Visit: 1 Visit OT Treatments $Self Care/Home Management : 8-22 mins  Jolaine Artist, [place] Office [phone]   Delight Stare 08/11/2022, 10:24 AM

## 2022-08-12 MED ORDER — SACUBITRIL-VALSARTAN 49-51 MG PO TABS
1.0000 | ORAL_TABLET | Freq: Two times a day (BID) | ORAL | Status: DC
  Administered 2022-08-12 – 2022-08-13 (×3): 1 via ORAL
  Filled 2022-08-12 (×3): qty 1

## 2022-08-12 NOTE — Progress Notes (Signed)
Mobility Specialist Progress Note:   08/12/22 1435  Mobility  Activity Refused mobility   Pt in bed stating he's having R side pain and deferred ambulation. Will follow-up as time allows.   [facility] Surveyor, mining Chat only

## 2022-08-12 NOTE — Progress Notes (Signed)
Physical Therapy Treatment Patient Details Name: Joe [name] MRN: [id number] DOB: 09/07/1956 Today's Date: 08/12/2022   History of Present Illness 66 yo male admitted 07/08/22 for ascending aortic arch replacement with Bentall procedure and CABGx1 same date. Extubated 9/28. Ileus, Afib and CHF post procedure. Hosptial stay continue to be complicated by back pain and UTI. PMHx: congenital brain damage, speech delay, bil THA, PLIF, Lt TSA, PVC    PT Comments    Pt progressing steadily towards his physical therapy goals, exhibiting improved activity tolerance and ambulation distance. Pt able to participate in warm up exercises and ambulating 220 ft with a walker at a min guard assist level. SpO2 97% on RA, HR 53-77 bpm. Discussed with pt mother level of assist pt is currently needing and activity progression; gait belt provided. D/c plan remains appropriate.     Recommendations for follow up therapy are one component of a multi-disciplinary discharge planning process, led by the attending physician.  Recommendations may be updated based on patient status, additional functional criteria and insurance authorization.  Follow Up Recommendations  Home health PT     Assistance Recommended at Discharge Frequent or constant Supervision/Assistance  Patient can return home with the following A little help with walking and/or transfers;A little help with bathing/dressing/bathroom;Help with stairs or ramp for entrance;Assist for transportation;Assistance with feeding   Equipment Recommendations  None recommended by PT    Recommendations for Other Services       Precautions / Restrictions Precautions Precautions: Sternal;Fall Restrictions Weight Bearing Restrictions: No     Mobility  Bed Mobility Overal bed mobility: Needs Assistance Bed Mobility: Rolling, Sidelying to Sit Rolling: Supervision Sidelying to sit: Min assist       General bed mobility comments: Verbal cues for initiation,  minA at trunk with HOB flat    Transfers Overall transfer level: Needs assistance Equipment used: Rolling walker (2 wheels) Transfers: Sit to/from Stand Sit to Stand: Min guard           General transfer comment: Cues for hand placement    Ambulation/Gait Ambulation/Gait assistance: Min guard Gait Distance (Feet): 220 Feet Assistive device: Rolling walker (2 wheels) Gait Pattern/deviations: Step-through pattern, Decreased stride length, Decreased dorsiflexion - left Gait velocity: decreased     General Gait Details: Cues for larger step lengths and wider BOS   Stairs             Wheelchair Mobility    Modified Rankin (Stroke Patients Only)       Balance Overall balance assessment: Needs assistance Sitting-balance support: No upper extremity supported, Feet supported Sitting balance-Leahy Scale: Good     Standing balance support: Bilateral upper extremity supported, During functional activity Standing balance-Leahy Scale: Poor Standing balance comment: reliant on RW                            Cognition Arousal/Alertness: Awake/alert Behavior During Therapy: Flat affect Overall Cognitive Status: History of cognitive impairments - at baseline                                 General Comments: increased time to follow commands        Exercises General Exercises - Lower Extremity Long Arc Quad: Both, 10 reps, Seated Hip Flexion/Marching: Both, 10 reps, Seated Toe Raises: Both, 10 reps, Seated Heel Raises: Both, 10 reps, Seated    General Comments  Pertinent Vitals/Pain Pain Assessment Pain Assessment: Faces Faces Pain Scale: Hurts little more Pain Location: bottom Pain Descriptors / Indicators: Grimacing, Discomfort Pain Intervention(s): Monitored during session    Home Living                          Prior Function            PT Goals (current goals can now be found in the care plan section)  Acute Rehab PT Goals Patient Stated Goal: home Potential to Achieve Goals: Good Progress towards PT goals: Progressing toward goals    Frequency    Min 3X/week      PT Plan Current plan remains appropriate    Co-evaluation              AM-PAC PT "6 Clicks" Mobility   Outcome Measure  Help needed turning from your back to your side while in a flat bed without using bedrails?: A Little Help needed moving from lying on your back to sitting on the side of a flat bed without using bedrails?: A Little Help needed moving to and from a bed to a chair (including a wheelchair)?: A Little Help needed standing up from a chair using your arms (e.g., wheelchair or bedside chair)?: A Little Help needed to walk in hospital room?: A Little Help needed climbing 3-5 steps with a railing? : A Little 6 Click Score: 18    End of Session Equipment Utilized During Treatment: Gait belt Activity Tolerance: Patient tolerated treatment well Patient left: in chair;with call bell/phone within reach;with family/visitor present Nurse Communication: Mobility status PT Visit Diagnosis: Other abnormalities of gait and mobility (R26.89);Muscle weakness (generalized) (M62.81);Unsteadiness on feet (R26.81)     Time: 1610-9604 PT Time Calculation (min) (ACUTE ONLY): 30 min  Charges:  $Gait Training: 8-22 mins $Therapeutic Activity: 8-22 mins                     Wyona Almas, PT, DPT Acute Rehabilitation Services Office [phone]    Deno Etienne 08/12/2022, 9:02 AM

## 2022-08-12 NOTE — Progress Notes (Signed)
Occupational Therapy Treatment Patient Details Name: Joe [name] MRN: [id number] DOB: [birth year] Today's Date: 08/12/2022   History of present illness 66 yo male admitted 07/08/22 for ascending aortic arch replacement with Bentall procedure and CABGx1 same date. Extubated 9/28. Ileus, Afib and CHF post procedure. Hosptial stay continue to be complicated by back pain and UTI. PMHx: congenital brain damage, speech delay, bil THA, PLIF, Lt TSA, PVC   OT comments  Pt session focused on LB self care training.  Pt reports using reacher to doff socks at home.  Utilized Best boy for LB dressing tasks after education with min assist.  Discussed with mom that if he needs help to place sock on sock aide that's okay, it will reduce strain on her overall; she agrees.  Reviewed option of long handed sponge for bathing.  Pt with flat affect, anticipate internally distracted by possible talk of rehab.  Continue to recommend [facility]. Will follow acutely.    Recommendations for follow up therapy are one component of a multi-disciplinary discharge planning process, led by the attending physician.  Recommendations may be updated based on patient status, additional functional criteria and insurance authorization.    Follow Up Recommendations  Home health OT (aide)    Assistance Recommended at Discharge Frequent or constant Supervision/Assistance  Patient can return home with the following  A little help with walking and/or transfers;Assistance with cooking/housework;Direct supervision/assist for medications management;Direct supervision/assist for financial management;Help with stairs or ramp for entrance;Assist for transportation;A little help with bathing/dressing/bathroom   Equipment Recommendations  None recommended by OT    Recommendations for Other Services Rehab consult    Precautions / Restrictions Precautions Precautions: Sternal;Fall Restrictions Weight Bearing Restrictions: No Other  Position/Activity Restrictions: sternal precautions       Mobility Bed Mobility               General bed mobility comments: OOB in recliner upon entry    Transfers Overall transfer level: Needs assistance Equipment used: Rolling walker (2 wheels) Transfers: Sit to/from Stand Sit to Stand: Min assist           General transfer comment: cueing for hand placement, use of momentum and min assist to power up from recliner     Balance Overall balance assessment: Needs assistance Sitting-balance support: No upper extremity supported, Feet supported Sitting balance-Leahy Scale: Good     Standing balance support: No upper extremity supported, During functional activity Standing balance-Leahy Scale: Poor Standing balance comment: min guard for simulated dressing tasks without UE support                           ADL either performed or assessed with clinical judgement   ADL Overall ADL's : Needs assistance/impaired                     Lower Body Dressing: Minimal assistance;Sit to/from stand;With adaptive equipment Lower Body Dressing Details (indicate cue type and reason): educated on use of reacher/sock aide for LB dressing, min assist to stand               General ADL Comments: focused on LB dressing, educated pt and mom on AE for dressing/bathing to decrease burden of care/optimize independence.    Extremity/Trunk Assessment              Vision       Perception     Praxis      Cognition Arousal/Alertness: Awake/alert  Behavior During Therapy: Flat affect Overall Cognitive Status: History of cognitive impairments - at baseline                                 General Comments: increased time to follow commands, slow processing.  Appears internally distracted (likely from talk of possible SNF instead of dc home)        Exercises      Shoulder Instructions       General Comments      Pertinent Vitals/  Pain       Pain Assessment Pain Assessment: Faces Faces Pain Scale: Hurts little more Pain Location: bottom Pain Descriptors / Indicators: Grimacing, Discomfort Pain Intervention(s): Monitored during session, Repositioned  Home Living                                          Prior Functioning/Environment              Frequency  Min 2X/week        Progress Toward Goals  OT Goals(current goals can now be found in the care plan section)  Progress towards OT goals: Progressing toward goals  Acute Rehab OT Goals Patient Stated Goal: home OT Goal Formulation: With patient Time For Goal Achievement: 08/18/22 Potential to Achieve Goals: [place] Discharge plan remains appropriate;Frequency remains appropriate    Co-evaluation                 AM-PAC OT "6 Clicks" Daily Activity     Outcome Measure   Help from another person eating meals?: A Little Help from another person taking care of personal grooming?: A Little Help from another person toileting, which includes using toliet, bedpan, or urinal?: A Lot Help from another person bathing (including washing, rinsing, drying)?: A Little Help from another person to put on and taking off regular upper body clothing?: A Little Help from another person to put on and taking off regular lower body clothing?: A Little 6 Click Score: 17    End of Session Equipment Utilized During Treatment: Rolling walker (2 wheels)  OT Visit Diagnosis: Unsteadiness on feet (R26.81);Repeated falls (R29.6);Muscle weakness (generalized) (M62.81);Pain Pain - part of body:  (bottom)   Activity Tolerance Patient tolerated treatment well   Patient Left in chair;with call bell/phone within reach;with family/visitor present   Nurse Communication Mobility status        Time: 1601-0932 OT Time Calculation (min): 28 min  Charges: OT General Charges $OT Visit: 1 Visit OT Treatments $Self Care/Home Management : 23-37  mins  [place] Office [phone]   Delight Stare 08/12/2022, 10:35 AM

## 2022-08-12 NOTE — Progress Notes (Signed)
      [id number]       [place] [id number]             [phone]      20 Days Post-Op Procedure(s) (LRB): TRANSESOPHAGEAL ECHOCARDIOGRAM (TEE) (N/A) CARDIOVERSION (N/A) Subjective: Patient states he feels okay today, no new complaints  Objective: Vital signs in last 24 hours: Temp:  [97.5 F (36.4 C)-98.5 F (36.9 C)] 98.5 F (36.9 C) (11/01 0402) Pulse Rate:  [50-53] 51 (11/01 0402) Cardiac Rhythm: Heart block;Bundle branch block (10/31 1900) Resp:  [12-18] 18 (11/01 0402) BP: (107-152)/(54-71) 135/71 (11/01 0402) SpO2:  [94 %-97 %] 96 % (11/01 0402)  Hemodynamic parameters for last 24 hours: CVP:  [4 mmHg-7 mmHg] 5 mmHg  Intake/Output from previous day: 10/31 0701 - 11/01 0700 In: -  Out: 1250 [Urine:1250] Intake/Output this shift: No intake/output data recorded.  General appearance: alert, cooperative, and no distress Neurologic: intact Heart: regular rate and rhythm, S1, S2 normal, no murmur, click, rub or gallop Lungs: clear to auscultation bilaterally Abdomen: soft, non-tender; bowel sounds normal; no masses,  no organomegaly Extremities: extremities normal, atraumatic, no cyanosis or edema Wound: full thickness sacral wound with clean and dry dressing, sternal would clean, dry, intact  Lab Results: Recent Labs    08/11/22 1248  WBC 6.4  HGB 10.3*  HCT 31.6*  PLT 259   BMET:  Recent Labs    08/10/22 0410 08/11/22 0315  NA 138 138  K 4.4 4.2  CL 107 106  CO2 26 26  GLUCOSE 87 89  BUN 21 24*  CREATININE 0.78 0.77  CALCIUM 9.0 9.0    PT/INR: No results for input(s): "LABPROT", "INR" in the last 72 hours. ABG    Component Value Date/Time   PHART 7.342 (L) 07/10/2022 0546   HCO3 23.4 07/10/2022 0546   TCO2 30 07/16/2022 0711   ACIDBASEDEF 2.0 07/10/2022 0546   O2SAT 70.3 08/04/2022 0922   CBG (last 3)  No results for input(s): "GLUCAP" in the last 72 hours.  Assessment/Plan: S/P Procedure(s) (LRB): TRANSESOPHAGEAL  ECHOCARDIOGRAM (TEE) (N/A) CARDIOVERSION (N/A)  CV: Hx of junctional rhythm, per EP.[name] KitchenMarland KitchenCRT-D as an outpatient. Hx of afib/aflutter, on amiodarone '200mg'$  QD. Last night 30 second run of atrial ectopy/afib? with rate 66. Sinus bradycardia this AM, rate 52. SBP 135. Chronic systolic heart failure, on Entresto 24-'26mg'$  BID, spironolactone 12.'5mg'$  QD, appreciate AHF assistance on this.  Pulm: Saturating 96% on RA, continue IS and ambulation.  GI: Hx postop ileus. KUB yesterday showed gas in mildly distended large and small bowel loops and increased stool in colon. +BM after enema yesterday. Renal: Urinary retention, catheter in place. Good UO. On flomax Expected postop ABLA: improved H/H 10.3/31.6 RUE DVT: Resolved, on Eliquis '5mg'$  BID Dispo: Will d/c with home health. Mother does not have staffing help until tomorrow. Will d/c tomorrow.   LOS: 35 days    Magdalene River, PA-C 08/12/2022

## 2022-08-12 NOTE — Progress Notes (Addendum)
Patient ID: JOSEDANIEL HAYE, male   DOB: 19-Jul-1956, 66 y.o.   MRN: [id number]     Advanced Heart Failure Rounding Note  PCP-Cardiologist: Skeet Latch, MD   Subjective:    9/27 s/p Bentall with bioprosthetic aortic valve . Post-op course c/b ileus.   S/p DCCV 10/12--> NSR/SB  Feels ok. Denies SOB. Had BM yesterday   Objective:   Weight Range: 68.1 kg Body mass index is 23.51 kg/m.   Vital Signs:   Temp:  [97.5 F (36.4 C)-98.5 F (36.9 C)] 98.5 F (36.9 C) (11/01 0402) Pulse Rate:  [50-53] 51 (11/01 0402) Resp:  [12-18] 18 (11/01 0402) BP: (107-152)/(54-71) 135/71 (11/01 0402) SpO2:  [94 %-97 %] 96 % (11/01 0402) Last BM Date : 08/10/22  Weight change: Filed Weights   08/07/22 0418 08/08/22 0630 08/09/22 0354  Weight: 69.2 kg 68.6 kg 68.1 kg    Intake/Output:   Intake/Output Summary (Last 24 hours) at 08/12/2022 0751 Last data filed at 08/12/2022 0405 Gross per 24 hour  Intake --  Output 1250 ml  Net -1250 ml    CVP 4-5  Physical Exam  General:  In bed. No resp difficulty HEENT: normal Neck: supple. no JVD. Carotids 2+ bilat; no bruits. No lymphadenopathy or thryomegaly appreciated. Cor: PMI nondisplaced. Regular rate & rhythm. No rubs, gallops or murmurs. Lungs: clear Abdomen: soft, nontender, nondistended. No hepatosplenomegaly. No bruits or masses. Good bowel sounds. Extremities: no cyanosis, clubbing, rash, edema. LUE PICC Neuro: alert & orientedx3, cranial nerves grossly intact. moves all 4 extremities w/o difficulty. Affect pleasant GU: foley    Telemetry   SB 50s   Labs    CBC Recent Labs    08/11/22 1248  WBC 6.4  HGB 10.3*  HCT 31.6*  MCV 99.4  PLT 259     Basic Metabolic Panel Recent Labs    08/10/22 0410 08/11/22 0315  NA 138 138  K 4.4 4.2  CL 107 106  CO2 26 26  GLUCOSE 87 89  BUN 21 24*  CREATININE 0.78 0.77  CALCIUM 9.0 9.0  MG 2.0 2.0   Liver Function Tests No results for input(s): "AST", "ALT", "ALKPHOS",  "BILITOT", "PROT", "ALBUMIN" in the last 72 hours.  BNP: BNP (last 3 results) Recent Labs    06/25/22 1829  BNP 436.2*     Imaging  reviewed  Medications:     Scheduled Medications:  amiodarone  200 mg Oral Daily   apixaban  5 mg Oral BID   aspirin EC  81 mg Oral Daily   atorvastatin  80 mg Oral Daily   Chlorhexidine Gluconate Cloth  6 each Topical Daily   leptospermum manuka honey  1 Application Topical Daily   metoCLOPramide (REGLAN) injection  10 mg Intravenous Q8H   pantoprazole  40 mg Oral Daily   polyethylene glycol  17 g Oral Daily   potassium chloride  40 mEq Oral Daily   sacubitril-valsartan  1 tablet Oral BID   senna-docusate  2 tablet Oral QHS   sertraline  25 mg Oral Daily   sodium chloride flush  10-40 mL Intracatheter Q12H   sodium chloride flush  3 mL Intravenous Q12H   spironolactone  12.5 mg Oral Daily   tamsulosin  0.4 mg Oral BID    Infusions:  sodium chloride Stopped (07/22/22 1203)   promethazine (PHENERGAN) injection (IM or IVPB)      PRN Medications: sodium chloride, acetaminophen, bisacodyl, diazepam, hydrocortisone, hydrocortisone, melatonin, ondansetron (ZOFRAN) IV, mouth rinse, promethazine (PHENERGAN)  injection (IM or IVPB), senna-docusate, sodium chloride flush, sodium chloride flush, traMADol    Patient Profile  66 y/o male recently found to have severe LV dysfunction in setting of markedly dilated aortic root (67 mm) and severe AI (tricuspid AoV) and single vessel CAD, now s/p Bentall and CABG x 1 w/ SVG-RCA. AHF team consulted to assist w/ post-operative management of systolic heart failure.     Assessment/Plan   Ascending Aortic Aneurysm/ Severe AI  - CTA  w/ severe dilation at the sinus of Valsalva, 67 mm. Moderate dilation of mid ascending aorta, 44 mm. Tricuspid AoV - s/p Bentall with bioprosthetic aortic valve 9/27 - intraoperative TEE, no perivalvular leak post repair  - Echo 10/10 EF 25% RV function improving. Moderate to  severe MR (in setting of LBBB) AVR ok   - Echo 10/23 EF 20% RV moderately reduced. Moderate to severe PI. AVR ok  - CTA 10/16 negative for dissection.   2. CAD - single vessel, 60-70% pRCA  - s/p CABG x 1, SVG- RCA  - ASA + statin  - No BB for now w/ bradycardia     3. Chronic Systolic Heart Failure  - newly diagnosed; Echo 9/23 EF 25-30%, LV severely dilated, severe AI, Global HK, RV ok  - NICM, severity out of proportion to degree of CAD  - likely valvular CM in setting of severe AI +/- LBBB CM  - now post op bioprosthetic AVR + CABG. Intraoperative TEE LVEF 20-25%, RV ok - With junctional rhythm, will hold off on beta blocker and digoxin for now to avoid worsening A-V conduction. Eventual CRT-D (see below).  - Hypotensive 10/04 in setting of urinary retention.  - Echo 10/9- EF 25% RV function improving. Moderate to severe MR (in setting of LBBB) AVR ok   - Echo 10/23-  EF 20% RV moderately reduced. Moderate to severe PI. AVR ok  - Volume status stable. CVP 4-5 . Renal function stable.  - Increase entresto 49-51 mg twice a day   - Continue spiro 12.5 mg daily - Now off midodrine.  - would avoid SGLT2i given urinary retention/foley   4. Junctional rhythm - Resolved  - Has epicardial LV lead. EP following. Had been considering CRT-D prior to discharge but given need for DC-CV and prolonged hospitalization likely better wait at least several weeks until he recovers   5. LBBB -Will eventually need epicardial lead hooked up for CRT see discussion above   6. Anemia - Post-op, 1 u RBCs 10/04. - Hgb stable 10.3  7. Thrombocytopenia - Resolved.  8. Atrial fib/flutter, post-op - s/p DCCV 10/12>>NSR  - Back in A fib 10/16 - Maintaining SB/SR--rate upper 50s-60s - Continue Amiodarone 200 mg daily  - Eliquis 5 mg bid   9. Urinary retention/UTI  - Followed by [facility] every 6 months and has been on flomax for some time.  - Recurrent bladder distention. CT abd--> 1600 cc  urine.  Foley replaced with 1300 cc out.  - UA +. Completed course of Rocephin - Continues w/ foley.    10. Post-op ileus/ Constipation  - 10/9 KUB c/w ileus  - ileus resolved, c/w intermittent constipation (chronic issue)  11. Hypokalemia/Hypomagnesemia  - Resolved - Keep K > 4 and Mag > 2  12. Hyponatremia - Resolved  13. Back Pain  - CTA 10/16 negative for dissection  - suspect 2/2 bladder distention   14. RUE Pain/ PICC Line Associated DVT  - [place] + for Acute nonocclusive  DVT surrounding the picc line in the subclavian vein - PICC replaced - Already on Eliquis for Afib  15. Situational Depression  - started sertraline 10/18   16. Deconditioning - CIR denied by insurance - PT recommending [facility]  17. Dizziness - dizziness when ambulating - orthostatics obtained 08/07/22 lying 148>83, sitting 152/93, standing 185/119. Difficult to access with PICC in one arm and restricted extremity  - much improved  18. Disposition - Insurance denied CIR - Mom hypertensive to 200s this weekend, went home to rest and realized she can't take care of him by herself. Looking into rehab facilities now. ? Status, checking w/ TOC - Stable from AHF standpoint for d/c  Length of Stay: [place] NP-C  7:51 AM   Patient seen and examined with the above-signed Advanced Practice Provider and/or Housestaff. I personally reviewed laboratory data, imaging studies and relevant notes. I independently examined the patient and formulated the important aspects of the plan. I have edited the note to reflect any of my changes or salient points. I have personally discussed the plan with the patient and/or family.  Resting comfortably, No CP or SOB. Remains in NSR. Tolerating switch to [employer].   General:  Well appearing. No resp difficulty HEENT: normal Neck: supple. no JVD. Carotids 2+ bilat; no bruits. No lymphadenopathy or thryomegaly appreciated. Cor: PMI nondisplaced. Regular rate & rhythm.  No rubs, gallops or murmurs. Lungs: clear Abdomen: soft, nontender, nondistended. No hepatosplenomegaly. No bruits or masses. Good bowel sounds. Extremities: no cyanosis, clubbing, rash, edema Neuro: alert & orientedx3, cranial nerves grossly intact. moves all 4 extremities w/o difficulty. Affect pleasant  Doing well from cardiac perspective. Will increase Entresto. Hopefully can be discharged home tomoorwo once home services in place. Will need outpatient voiding trials with [facility].   Glori Bickers, MD  11:26 PM

## 2022-08-13 ENCOUNTER — Ambulatory Visit: Payer: Medicare Other

## 2022-08-13 ENCOUNTER — Other Ambulatory Visit (HOSPITAL_COMMUNITY): Payer: Self-pay

## 2022-08-13 LAB — BASIC METABOLIC PANEL
Anion gap: 6 (ref 5–15)
BUN: 19 mg/dL (ref 8–23)
CO2: 25 mmol/L (ref 22–32)
Calcium: 8.7 mg/dL — ABNORMAL LOW (ref 8.9–10.3)
Chloride: 107 mmol/L (ref 98–111)
Creatinine, Ser: 0.66 mg/dL (ref 0.61–1.24)
GFR, Estimated: >60 mL/min (ref >60)
Glucose, Bld: 98 mg/dL (ref 70–99)
Potassium: 4.3 mmol/L (ref 3.5–5.1)
Sodium: 138 mmol/L (ref 135–145)

## 2022-08-13 MED ORDER — SACUBITRIL-VALSARTAN 49-51 MG PO TABS
1.0000 | ORAL_TABLET | Freq: Two times a day (BID) | ORAL | 1 refills | Status: DC
  Filled 2022-08-13: qty 60, 30d supply, fill #0
  Filled ????-??-??: fill #0

## 2022-08-13 MED ORDER — POLYETHYLENE GLYCOL 3350 17 G PO PACK: 17.0000 g | PACK | Freq: Every day | ORAL | 1 refills | Status: DC

## 2022-08-13 MED ORDER — ATORVASTATIN CALCIUM 80 MG PO TABS
80.0000 mg | ORAL_TABLET | Freq: Every day | ORAL | 1 refills | Status: DC
  Filled 2022-08-13 – 2022-09-08 (×3): qty 30, 30d supply, fill #0
  Filled ????-??-??: fill #0

## 2022-08-13 MED ORDER — FUROSEMIDE 20 MG PO TABS
40.0000 mg | ORAL_TABLET | ORAL | 1 refills | Status: DC | PRN
  Filled 2022-08-13: qty 60, 30d supply, fill #0
  Filled ????-??-??: fill #0

## 2022-08-13 MED ORDER — APIXABAN 5 MG PO TABS
5.0000 mg | ORAL_TABLET | Freq: Two times a day (BID) | ORAL | 1 refills | Status: DC
  Filled 2022-08-13 – 2022-09-08 (×3): qty 60, 30d supply, fill #0
  Filled ????-??-??: fill #0

## 2022-08-13 MED ORDER — MEDIHONEY WOUND/BURN DRESSING EX PSTE
1.0000 | PASTE | Freq: Every day | CUTANEOUS | 1 refills | Status: DC
  Filled 2022-08-13: qty 15, 15d supply, fill #0

## 2022-08-13 MED ORDER — ASPIRIN 81 MG PO TBEC
81.0000 mg | DELAYED_RELEASE_TABLET | Freq: Every day | ORAL | 12 refills | Status: DC
  Filled 2022-08-13: qty 30, 30d supply, fill #0
  Filled ????-??-??: fill #0

## 2022-08-13 MED ORDER — POTASSIUM CHLORIDE CRYS ER 20 MEQ PO TBCR
20.0000 meq | EXTENDED_RELEASE_TABLET | ORAL | 1 refills | Status: DC | PRN
  Filled 2022-08-13: qty 30, 30d supply, fill #0
  Filled ????-??-??: fill #0

## 2022-08-13 MED ORDER — TRAMADOL HCL 50 MG PO TABS
50.0000 mg | ORAL_TABLET | Freq: Four times a day (QID) | ORAL | 0 refills | Status: DC | PRN
  Filled 2022-08-13: qty 28, 7d supply, fill #0

## 2022-08-13 MED ORDER — AMIODARONE HCL 200 MG PO TABS
200.0000 mg | ORAL_TABLET | Freq: Every day | ORAL | 1 refills | Status: DC
  Filled 2022-08-13 – 2022-09-08 (×3): qty 30, 30d supply, fill #0
  Filled ????-??-??: fill #0

## 2022-08-13 NOTE — Progress Notes (Signed)
Pt declined ambulation due to walking earlier, reviewed daily weights, low na diet, and sternal precautions.

## 2022-08-13 NOTE — Progress Notes (Addendum)
Patient ID: Joe [name], male   DOB: 1956/03/12, 66 y.o.   MRN: [id number]     Advanced Heart Failure Rounding Note  PCP-Cardiologist: Skeet Latch, MD   Subjective:    9/27 s/p Bentall with bioprosthetic aortic valve . Post-op course c/b ileus.   S/p DCCV 10/12--> NSR/SB  Feels good today, got news that he's going home! Denies CP, dizziness and SOB. Mom at bedside feeding him.   Objective:   Weight Range: 70.9 kg Body mass index is 24.5 kg/m.   Vital Signs:   Temp:  [97.6 F (36.4 C)-98 F (36.7 C)] 98 F (36.7 C) (11/02 0727) Pulse Rate:  [53-57] 57 (11/02 0727) Resp:  [12-18] 16 (11/02 0727) BP: (113-142)/(55-79) 113/60 (11/02 0727) SpO2:  [92 %-98 %] 98 % (11/02 0727) Weight:  [70.9 kg] 70.9 kg (11/02 0500) Last BM Date : 08/11/22  Weight change: Filed Weights   08/08/22 0630 08/09/22 0354 08/13/22 0500  Weight: 68.6 kg 68.1 kg 70.9 kg    Intake/Output:   Intake/Output Summary (Last 24 hours) at 08/13/2022 0818 Last data filed at 08/13/2022 0344 Gross per 24 hour  Intake 120 ml  Output 1000 ml  Net -880 ml    Physical Exam  General:  well appearing.  No respiratory difficulty HEENT: normal Neck: supple. No JVD. Carotids 2+ bilat; no bruits. No lymphadenopathy or thyromegaly appreciated. Cor: PMI nondisplaced. Regular rate & rhythm. No rubs, gallops or murmurs. Lungs: clear Abdomen: soft, nontender, nondistended. No hepatosplenomegaly. No bruits or masses. Good bowel sounds. Extremities: no cyanosis, clubbing, rash, edema. PICC RUE GU: +foley Neuro: alert & oriented x 3, cranial nerves grossly intact. moves all 4 extremities w/o difficulty. Affect pleasant.   Telemetry   NSR 60s (Personally reviewed)    Labs    CBC Recent Labs    08/11/22 1248  WBC 6.4  HGB 10.3*  HCT 31.6*  MCV 99.4  PLT 259     Basic Metabolic Panel Recent Labs    08/11/22 0315 08/13/22 0500  NA 138 138  K 4.2 4.3  CL 106 107  CO2 26 25  GLUCOSE 89 98   BUN 24* 19  CREATININE 0.77 0.66  CALCIUM 9.0 8.7*  MG 2.0  --    Liver Function Tests No results for input(s): "AST", "ALT", "ALKPHOS", "BILITOT", "PROT", "ALBUMIN" in the last 72 hours.  BNP: BNP (last 3 results) Recent Labs    06/25/22 1829  BNP 436.2*     Imaging  reviewed  Medications:     Scheduled Medications:  amiodarone  200 mg Oral Daily   apixaban  5 mg Oral BID   aspirin EC  81 mg Oral Daily   atorvastatin  80 mg Oral Daily   Chlorhexidine Gluconate Cloth  6 each Topical Daily   leptospermum manuka honey  1 Application Topical Daily   metoCLOPramide (REGLAN) injection  10 mg Intravenous Q8H   pantoprazole  40 mg Oral Daily   polyethylene glycol  17 g Oral Daily   potassium chloride  40 mEq Oral Daily   sacubitril-valsartan  1 tablet Oral BID   senna-docusate  2 tablet Oral QHS   sertraline  25 mg Oral Daily   sodium chloride flush  10-40 mL Intracatheter Q12H   sodium chloride flush  3 mL Intravenous Q12H   spironolactone  12.5 mg Oral Daily   tamsulosin  0.4 mg Oral BID    Infusions:  sodium chloride Stopped (07/22/22 1203)   promethazine (PHENERGAN) injection (  IM or IVPB)      PRN Medications: sodium chloride, acetaminophen, bisacodyl, diazepam, hydrocortisone, hydrocortisone, melatonin, ondansetron (ZOFRAN) IV, mouth rinse, promethazine (PHENERGAN) injection (IM or IVPB), senna-docusate, sodium chloride flush, sodium chloride flush, traMADol    Patient Profile  66 y/o male recently found to have severe LV dysfunction in setting of markedly dilated aortic root (67 mm) and severe AI (tricuspid AoV) and single vessel CAD, now s/p Bentall and CABG x 1 w/ SVG-RCA. AHF team consulted to assist w/ post-operative management of systolic heart failure.   Assessment/Plan   Ascending Aortic Aneurysm/ Severe AI  - CTA  w/ severe dilation at the sinus of Valsalva, 67 mm. Moderate dilation of mid ascending aorta, 44 mm. Tricuspid AoV - s/p Bentall with  bioprosthetic aortic valve 9/27 - intraoperative TEE, no perivalvular leak post repair  - Echo 10/10 EF 25% RV function improving. Moderate to severe MR (in setting of LBBB) AVR ok   - Echo 10/23 EF 20% RV moderately reduced. Moderate to severe PI. AVR ok  - CTA 10/16 negative for dissection.   2. CAD - single vessel, 60-70% pRCA  - s/p CABG x 1, SVG- RCA  - ASA + statin  - No BB for now w/ bradycardia     3. Chronic Systolic Heart Failure  - newly diagnosed; Echo 9/23 EF 25-30%, LV severely dilated, severe AI, Global HK, RV ok  - NICM, severity out of proportion to degree of CAD  - likely valvular CM in setting of severe AI +/- LBBB CM  - now post op bioprosthetic AVR + CABG. Intraoperative TEE LVEF 20-25%, RV ok - With junctional rhythm, will hold off on beta blocker and digoxin for now to avoid worsening A-V conduction. Eventual CRT-D (see below).  - Hypotensive 10/04 in setting of urinary retention.  - Echo 10/9- EF 25% RV function improving. Moderate to severe MR (in setting of LBBB) AVR ok   - Echo 10/23-  EF 20% RV moderately reduced. Moderate to severe PI. AVR ok  - Volume status stable. CVP now off, no JVD. Renal function stable.  - Continue entresto 49-51 mg twice a day   - Continue spiro 12.5 mg daily - Now off midodrine.  - would avoid SGLT2i given urinary retention/foley   4. Junctional rhythm - Resolved  - Has epicardial LV lead. EP following. Had been considering CRT-D prior to discharge but given need for DC-CV and prolonged hospitalization likely better wait at least several weeks until he recovers   5. LBBB -Will eventually need epicardial lead hooked up for CRT see discussion above   6. Anemia - Post-op, 1 u RBCs 10/04. - Hgb stable 10.3  7. Thrombocytopenia - Resolved.  8. Atrial fib/flutter, post-op - s/p DCCV 10/12>>NSR  - Back in A fib 10/16 - Maintaining SB/SR--rate upper 50s-60s - Continue Amiodarone 200 mg daily  - Eliquis 5 mg bid   9.  Urinary retention/UTI  - Followed by [facility] every 6 months and has been on flomax for some time.  - Recurrent bladder distention. CT abd--> 1600 cc urine.  Foley replaced with 1300 cc out.  - UA +. Completed course of Rocephin - Continues w/ foley.    10. Post-op ileus/ Constipation  - 10/9 KUB c/w ileus  - ileus resolved, c/w intermittent constipation (chronic issue)  11. Hypokalemia/Hypomagnesemia  - Resolved - Keep K > 4 and Mag > 2  12. Hyponatremia - Resolved  13. Back Pain  - CTA 10/16  negative for dissection  - suspect 2/2 bladder distention   14. RUE Pain/ PICC Line Associated DVT  - [place] + for Acute nonocclusive DVT surrounding the picc line in the subclavian vein - PICC replaced - Already on Eliquis for Afib  15. Situational Depression  - started sertraline 10/18   16. Deconditioning - CIR denied by insurance - PT recommending [facility]  17. Dizziness - dizziness when ambulating - orthostatics obtained 08/07/22 lying 148>83, sitting 152/93, standing 185/119. Difficult to access with PICC in one arm and restricted extremity  - resolved  18. Disposition - Insurance denied CIR - Mom hypertensive to 200s over the weekend, went home to rest and realized she can't take care of him by herself. Looked into rehab facilities. Decided to go home, [facility] RN to see 2x/week and HHPT. Got private RN as well to help with care.  - Stable from AHF standpoint for d/c  AHF d/c home meds: Amiodarone 200 mg daily Eliquis 5 mg BID ASA 81 mg daily Atorvastatin 80 mg daily Entresto 49-51 mg BID Spironolactone 25 mg daily Lasix '40mg'$  + kcl 20 PRN for weight gain   F/u scheduled  Length of Stay: [place] AGACNP-BC   8:18 AM  Patient seen and examined with the above-signed Advanced Practice Provider and/or Housestaff. I personally reviewed laboratory data, imaging studies and relevant notes. I independently examined the patient and formulated the important aspects of  the plan. I have edited the note to reflect any of my changes or salient points. I have personally discussed the plan with the patient and/or family.  Doing well. No CP or SOB. BP improved  General:  Well appearing. No resp difficulty HEENT: normal Neck: supple. no JVD. Carotids 2+ bilat; no bruits. No lymphadenopathy or thryomegaly appreciated. Cor: PMI laterally displaced. Regular rate & rhythm. No rubs, gallops or murmurs. Lungs: clear Abdomen: soft, nontender, nondistended. No hepatosplenomegaly. No bruits or masses. Good bowel sounds. Extremities: no cyanosis, clubbing, rash, edema Neuro: alert & orientedx3, cranial nerves grossly intact. moves all 4 extremities w/o difficulty. Affect pleasant  Looks good. Home today on above meds. (I have reviewed and edited personally)   Will need f/u in Clinic next week.   Glori Bickers, MD  12:19 PM

## 2022-08-13 NOTE — Plan of Care (Signed)
  Problem: Education: Goal: Knowledge of General Education information will improve Description: Including pain rating scale, medication(s)/side effects and non-pharmacologic comfort measures Outcome: Progressing   Problem: Health Behavior/Discharge Planning: Goal: Ability to manage health-related needs will improve Outcome: Progressing   Problem: Clinical Measurements: Goal: Ability to maintain clinical measurements within normal limits will improve Outcome: Progressing Goal: Will remain free from infection Outcome: Progressing Goal: Diagnostic test results will improve Outcome: Progressing Goal: Respiratory complications will improve Outcome: Progressing Goal: Cardiovascular complication will be avoided Outcome: Progressing   Problem: Activity: Goal: Risk for activity intolerance will decrease Outcome: Progressing   Problem: Nutrition: Goal: Adequate nutrition will be maintained Outcome: Progressing   Problem: Coping: Goal: Level of anxiety will decrease Outcome: Progressing   Problem: Elimination: Goal: Will not experience complications related to bowel motility Outcome: Progressing Goal: Will not experience complications related to urinary retention Outcome: Progressing   Problem: Pain Managment: Goal: General experience of comfort will improve Outcome: Progressing   Problem: Safety: Goal: Ability to remain free from injury will improve Outcome: Progressing   Problem: Skin Integrity: Goal: Risk for impaired skin integrity will decrease Outcome: Progressing   Problem: Education: Goal: Will demonstrate proper wound care and an understanding of methods to prevent future damage Outcome: Progressing Goal: Knowledge of disease or condition will improve Outcome: Progressing Goal: Knowledge of the prescribed therapeutic regimen will improve Outcome: Progressing   Problem: Activity: Goal: Risk for activity intolerance will decrease Outcome: Progressing    Problem: Cardiac: Goal: Will achieve and/or maintain hemodynamic stability Outcome: Progressing   Problem: Clinical Measurements: Goal: Postoperative complications will be avoided or minimized Outcome: Progressing   Problem: Respiratory: Goal: Respiratory status will improve Outcome: Progressing   Problem: Skin Integrity: Goal: Wound healing without signs and symptoms of infection Outcome: Progressing Goal: Risk for impaired skin integrity will decrease Outcome: Progressing   Problem: Urinary Elimination: Goal: Ability to achieve and maintain adequate renal perfusion and functioning will improve Outcome: Progressing   Problem: Education: Goal: Ability to describe self-care measures that may prevent or decrease complications (Diabetes Survival Skills Education) will improve Outcome: Progressing   Problem: Coping: Goal: Ability to adjust to condition or change in health will improve Outcome: Progressing   Problem: Fluid Volume: Goal: Ability to maintain a balanced intake and output will improve Outcome: Progressing   Problem: Health Behavior/Discharge Planning: Goal: Ability to identify and utilize available resources and services will improve Outcome: Progressing Goal: Ability to manage health-related needs will improve Outcome: Progressing   Problem: Metabolic: Goal: Ability to maintain appropriate glucose levels will improve Outcome: Progressing   Problem: Nutritional: Goal: Maintenance of adequate nutrition will improve Outcome: Progressing Goal: Progress toward achieving an optimal weight will improve Outcome: Progressing   Problem: Skin Integrity: Goal: Risk for impaired skin integrity will decrease Outcome: Progressing   Problem: Tissue Perfusion: Goal: Adequacy of tissue perfusion will improve Outcome: Progressing

## 2022-08-13 NOTE — Progress Notes (Signed)
Mobility Specialist Progress Note:   08/13/22 0851  Mobility  Activity Ambulated with assistance in hallway  Level of Assistance Standby assist, set-up cues, supervision of patient - no hands on  Assistive Device Front wheel walker  Distance Ambulated (ft) 200 ft  Activity Response Tolerated well  $Mobility charge 1 Mobility   Pt received in bed willing to participate in mobility. Complaints of his bottom hurting. Left in chair with call bell in reach and all needs met.   [facility] Surveyor, mining Chat only

## 2022-08-13 NOTE — TOC Transition Note (Signed)
Transition of Care (TOC) - CM/SW Discharge Note Marvetta Gibbons RN, BSN Transitions of Care Unit 4E- RN Case Manager See Treatment Team for direct phone #   Patient Details  Name: DUEL CONRAD MRN: [id number] Date of Birth: [birth year]  Transition of Care [facility]) CM/SW Contact:  Dawayne Patricia, RN Phone Number: 08/13/2022, 2:16 PM   Clinical Narrative:    Pt stable for transition home today w/ mom. HH has been set up with [facility]- CM notified liaison- Caryl Pina for start of care. [facility] to f/u for scheduling within 48 hr post discharge.   Mother to come provide transport home later this afternoon after she has support from private duty that she has arranged.  No further TOC needs noted.    Final next level of care: [place] Barriers to Discharge: Barriers Resolved   Patient Goals and CMS Choice Patient states their goals for this hospitalization and ongoing recovery are:: better and return home CMS Medicare.gov Compare Post Acute Care list provided to:: Patient Represenative (must comment) (Mother-[name]) Choice offered to / list presented to : Patient, Parent  Discharge Placement               Home w/ [facility]        Discharge Plan and Services   Discharge Planning Services: CM Consult Post Acute Care Choice: Home Health          DME Arranged: N/A DME Agency: NA       HH Arranged: RN, OT, PT, Nurse's Aide [place] Agency: [place] ([facility]) Date HH Agency Contacted: 08/07/22 Time [place]: [phone] Representative spoke with at [place]: [place] ([place]) Interventions     Readmission Risk Interventions    08/13/2022    2:16 PM 08/13/2022    2:15 PM  Readmission Risk Prevention Plan  Transportation Screening  Complete  PCP or Specialist Appt within 3-5 Days Complete   HRI or [place]  Complete  Social Work Consult for [place] Planning/Counseling  Complete   Palliative Care Screening  Not Applicable  Medication Review Press photographer)  Complete

## 2022-08-13 NOTE — Progress Notes (Addendum)
      [address]       [place] [address]             [phone]      21 Days Post-Op Procedure(s) (LRB): TRANSESOPHAGEAL ECHOCARDIOGRAM (TEE) (N/A) CARDIOVERSION (N/A) Subjective: Patient states he feels okay today. No new complaints  Objective: Vital signs in last 24 hours: Temp:  [97.6 F (36.4 C)-98 F (36.7 C)] 98 F (36.7 C) (11/02 0343) Pulse Rate:  [53-57] 57 (11/02 0343) Cardiac Rhythm: Normal sinus rhythm;Bundle branch block (11/01 1900) Resp:  [12-18] 13 (11/02 0343) BP: (116-142)/(55-79) 119/79 (11/02 0343) SpO2:  [92 %-97 %] 92 % (11/02 0343) Weight:  [70.9 kg] 70.9 kg (11/02 0500)  Hemodynamic parameters for last 24 hours: CVP:  [6 mmHg] 6 mmHg  Intake/Output from previous day: 11/01 0701 - 11/02 0700 In: 120 [P.O.:120] Out: 1000 [Urine:1000] Intake/Output this shift: No intake/output data recorded.  General appearance: alert, cooperative, and no distress Neurologic: intact Heart: regular rate and rhythm, S1, S2 normal, no murmur, click, rub or gallop Lungs: clear to auscultation bilaterally Abdomen: soft, non-tender; bowel sounds normal; no masses,  no organomegaly Extremities: extremities normal, atraumatic, no cyanosis or edema Wound: Full thickness sacral wound, sternal wound clean, dry and intact  Lab Results: Recent Labs    08/11/22 1248  WBC 6.4  HGB 10.3*  HCT 31.6*  PLT 259   BMET:  Recent Labs    08/11/22 0315 08/13/22 0500  NA 138 138  K 4.2 4.3  CL 106 107  CO2 26 25  GLUCOSE 89 98  BUN 24* 19  CREATININE 0.77 0.66  CALCIUM 9.0 8.7*    PT/INR: No results for input(s): "LABPROT", "INR" in the last 72 hours. ABG    Component Value Date/Time   PHART 7.342 (L) 07/10/2022 0546   HCO3 23.4 07/10/2022 0546   TCO2 30 07/16/2022 0711   ACIDBASEDEF 2.0 07/10/2022 0546   O2SAT 70.3 08/04/2022 0922   CBG (last 3)  No results for input(s): "GLUCAP" in the last 72 hours.  Assessment/Plan: S/P  Procedure(s) (LRB): TRANSESOPHAGEAL ECHOCARDIOGRAM (TEE) (N/A) CARDIOVERSION (N/A)  CV: Hx of junctional rhythm, per EP.[name] KitchenMarland KitchenCRT-D as an outpatient. Hx of afib/aflutter, on amiodarone '200mg'$  QD. Sinus bradycardia-sinus rhythm this AM, rate 60. SBP 119. Chronic systolic heart failure, on Entresto 49-'51mg'$  BID, spironolactone 12.'5mg'$  QD, appreciate AHF assistance on this.  Pulm: Saturating 92% on RA, continue IS and ambulation.  GI: Hx postop ileus. KUB on 10/31 showed gas in mildly distended large and small bowel loops and increased stool in colon. +BM after enema. Decreased abdominal discomfort Renal: Urinary retention, catheter in place. Good UO. On flomax Expected postop ABLA: stable H/H 10.3/31.6 RUE DVT: Resolved, on Eliquis '5mg'$  BID Dispo: PT/OT evaluation recommended home health. Mother has private staffing care as well as home health aide/RN/PT/OT lined up for today but is not currently in the room. Mother has been in and out of ED due to health problems herself, unsure if she will be able to take him home. Will speak with mom when she arrives.  Spoke with mother and she states she is feeling up to taking him home and she has services in place. Would like to leave at 4pm today.    LOS: 36 days    Magdalene River, PA-C 08/13/2022

## 2022-08-13 NOTE — Care Management Important Message (Signed)
Important Message  Patient Details  Name: Joe [name] MRN: [id number] Date of Birth: 1956-02-09   Medicare Important Message Given:  Yes     [name] 08/13/2022, 10:18 AM

## 2022-08-14 ENCOUNTER — Encounter (HOSPITAL_COMMUNITY): Payer: Medicare Other

## 2022-08-17 ENCOUNTER — Ambulatory Visit: Payer: Medicare Other | Admitting: Student

## 2022-08-19 ENCOUNTER — Ambulatory Visit (HOSPITAL_COMMUNITY): Payer: Medicare Other | Attending: Cardiology

## 2022-08-19 ENCOUNTER — Ambulatory Visit (HOSPITAL_BASED_OUTPATIENT_CLINIC_OR_DEPARTMENT_OTHER): Payer: Medicare Other | Admitting: Cardiovascular Disease

## 2022-08-19 ENCOUNTER — Ambulatory Visit: Payer: Medicare Other

## 2022-08-19 ENCOUNTER — Ambulatory Visit
Admission: RE | Admit: 2022-08-19 | Discharge: 2022-08-19 | Disposition: A | Discharge status: Home / Self Care | Payer: Medicare Other | Source: Ambulatory Visit | Attending: Thoracic Surgery (Cardiothoracic Vascular Surgery) | Admitting: Thoracic Surgery (Cardiothoracic Vascular Surgery)

## 2022-08-19 ENCOUNTER — Ambulatory Visit (INDEPENDENT_AMBULATORY_CARE_PROVIDER_SITE_OTHER): Payer: Self-pay | Admitting: Physician Assistant

## 2022-08-19 VITALS — BP 90/59 | HR 60 | Resp 20 | Ht 67.0 in | Wt 161.0 lb

## 2022-08-19 DIAGNOSIS — Z952 Presence of prosthetic heart valve: Secondary | ICD-10-CM | POA: Diagnosis present

## 2022-08-19 DIAGNOSIS — Z951 Presence of aortocoronary bypass graft: Secondary | ICD-10-CM

## 2022-08-19 DIAGNOSIS — Z95828 Presence of other vascular implants and grafts: Secondary | ICD-10-CM | POA: Diagnosis not present

## 2022-08-19 DIAGNOSIS — R011 Cardiac murmur, unspecified: Secondary | ICD-10-CM | POA: Insufficient documentation

## 2022-08-19 DIAGNOSIS — I480 Paroxysmal atrial fibrillation: Secondary | ICD-10-CM

## 2022-08-19 DIAGNOSIS — I340 Nonrheumatic mitral (valve) insufficiency: Secondary | ICD-10-CM | POA: Diagnosis not present

## 2022-08-19 DIAGNOSIS — I447 Left bundle-branch block, unspecified: Secondary | ICD-10-CM | POA: Diagnosis not present

## 2022-08-19 DIAGNOSIS — I509 Heart failure, unspecified: Secondary | ICD-10-CM | POA: Diagnosis not present

## 2022-08-19 LAB — ECHOCARDIOGRAM COMPLETE
AR max vel: 1.74 cm2
AV Area VTI: 2.02 cm2
AV Area mean vel: 1.63 cm2
AV Mean grad: 5.7 mmHg
AV Peak grad: 11.3 mmHg
Ao pk vel: 1.68 m/s
Area-P 1/2: 2.49 cm2
Height: 67 in
S' Lateral: 6 cm
Weight: 2576 oz

## 2022-08-19 NOTE — Patient Instructions (Signed)
No change in medications from CT surgery standpoint. Continue to monitor blood pressure at home. Follow-up in 2 weeks with Dr. Lavonna Monarch

## 2022-08-19 NOTE — Progress Notes (Signed)
[name]             [phone]       HPI:  Patient returns for routine postoperative follow-up having undergone a bio Bentall procedure by Dr. Lavonna Monarch on 07/08/2022 aortic root aneurysm.  He also had coronary bypass grafting x1 at the time.  His postoperative course was complicated due to poor LV function with both systolic and diastolic heart failure.  Ejection fraction was 25 to 30%.  He was postoperative course was further complicated by accelerated junctional rhythm, bundle branch block, and postoperative atrial fibrillation.  He was followed closely by the heart failure team.  He was discharged home 1 week ago on amiodarone, Eliquis, aspirin, atorvastatin, Entresto, spironolactone, and as needed Lasix.  He returns today for scheduled follow-up  Since hospital discharge the patient reports primary source of pain has been due to pressure sore over his sacrum.  For this reason, he has been spending a lot of time up on his feet to avoid the pain he counters when sitting.  His family reports his weight has not changed since his discharge but he does have more swelling in his ankles.    Home health nursing was arranged at the time of discharge with the first visit yesterday.  They assisted with changing the dressing to the sacral at that time.  He has been referred to the wound care clinic for further management of the pressure sore the first appointment scheduled for 11/16.  [name] family also reported to me that he was discovered to have a UTI yesterday.  He was started on Bactrim DS for 3 days.  Plans are to keep the Foley catheter in until next week..   Current Outpatient Medications  Medication Sig Dispense Refill   acetaminophen (TYLENOL) 325 MG tablet Take 650 mg by mouth every 6 (six) hours as needed for mild pain.     amiodarone (PACERONE) 200 MG tablet Take 1 tablet (200 mg total) by mouth daily. 30 tablet 1   apixaban  (ELIQUIS) 5 MG TABS tablet Take 1 tablet (5 mg total) by mouth 2 (two) times daily. 60 tablet 1   aspirin EC 81 MG tablet Take 1 tablet (81 mg total) by mouth daily. Swallow whole. 30 tablet 12   atorvastatin (LIPITOR) 80 MG tablet Take 1 tablet (80 mg total) by mouth daily. 30 tablet 1   Cholecalciferol (VITAMIN D3) 250 MCG (10000 UT) capsule Take 10,000 Units by mouth daily.     Coenzyme Q10 (CO Q-10) 100 MG CAPS Take 100 mg by mouth daily.     fluticasone (FLONASE) 50 MCG/ACT nasal spray Place 2 sprays into both nostrils daily. (Patient taking differently: Place 2 sprays into both nostrils daily as needed for allergies.) 16 g 1   furosemide (LASIX) 20 MG tablet Take 2 tablets (40 mg total) by mouth as needed for edema (Take 2 tabs as needed for weight gain of 3lbs in 24 hours or 5 lbs in 48 hours). 60 tablet 1   leptospermum manuka honey (MEDIHONEY) PSTE paste Apply 1 Application topically daily. 15 mL 1   polyethylene glycol (MIRALAX / GLYCOLAX) 17 g packet Take 17 g by mouth daily. 30 each 1   potassium chloride SA (KLOR-CON M) 20 MEQ tablet Take 1 tablet (20 mEq total) by mouth as needed (Take one tab on the days you require lasix). 30 tablet 1  sacubitril-valsartan (ENTRESTO) 49-51 MG Take 1 tablet by mouth 2 (two) times daily. 60 tablet 1   spironolactone (ALDACTONE) 25 MG tablet Take 0.5 tablets (12.5 mg total) by mouth daily. 15 tablet 5   tamsulosin (FLOMAX) 0.4 MG CAPS capsule Take 0.4 mg by mouth 2 (two) times daily.     traMADol (ULTRAM) 50 MG tablet Take 1 tablet (50 mg total) by mouth every 6 (six) hours as needed for moderate pain. 28 tablet 0   No current facility-administered medications for this visit.    Physical Exam Vital signs BP 90/59 Heart rate 60 Respirations 20 SPO2 94% on room air  General: Mr. Oak is walking with the assistance of rolling walker with good mobility and balance.  He was alert and responsive. Heart: Heart regular rate and rhythm.. Chest:  Breath sounds are full, equal, and clear to auscultation.  Chest x-ray shows continued improvement with resolution of previous diffuse interstitial opacity Back: An occlusive dressing covers the sacral decubitus.  This was just placed yesterday with plans to leave it for 3 days.  I did not remove it here in the office. He has an indwelling Foley catheter with cloudy urine in the tubing  Diagnostic Tests:  Narrative & Impression  CLINICAL DATA:  Status post CABG   EXAM: CHEST - 2 VIEW   COMPARISON:  07/28/2022   FINDINGS: Gross cardiomegaly status post median sternotomy and CABG with abandoned pacer leads. Small bilateral pleural effusions and or pleural thickening with bibasilar atelectasis or scarring, unchanged. Interval resolution of previously seen diffuse interstitial pulmonary opacity. Disc degenerative disease of the thoracic spine.   IMPRESSION: 1. Interval resolution of previously seen diffuse interstitial opacity.   2. Unchanged small bilateral pleural effusions and or pleural thickening.   3.  No new airspace opacity.   4.  Cardiomegaly.     Electronically Signed   By: Delanna Ahmadi M.D.   On: 08/19/2022 13:35    Impression / Plan: Joe [name]  continues to make a slow but progressive recovery after the Bentall procedure and coronary bypass grafting for aortic root aneurysm.  He had a long and complicated course related to LV dysfunction and atrial fibrillation.  He appears to be in sinus rhythm today.  His blood pressure is borderline.  His blood pressure is being monitored at home and I advised his family to contact the cardiology team if his blood pressure trends lower.  He does have significant lower extremity edema that has increased over the past several days since discharge according to his family.  However, I am reluctant to advise him to use his diuretic since his blood pressure is marginal.  Other issues including acute urinary tract infection is being  managed with Septra DS.  Next week.   He also has a sacral pressure sore that is being treated with dressing changes by home health nursing.  He has scheduled follow-up with the wound care clinic on 11/16.  We will plan to follow-up with Mr. Ghuman here in the office in 2 weeks   Antony Odea, PA-C [facility] [phone]

## 2022-08-20 ENCOUNTER — Ambulatory Visit (HOSPITAL_BASED_OUTPATIENT_CLINIC_OR_DEPARTMENT_OTHER): Payer: Medicare Other | Admitting: Family

## 2022-08-27 ENCOUNTER — Other Ambulatory Visit (HOSPITAL_COMMUNITY): Payer: Self-pay

## 2022-08-27 ENCOUNTER — Encounter (HOSPITAL_BASED_OUTPATIENT_CLINIC_OR_DEPARTMENT_OTHER): Payer: Medicare Other | Attending: Internal Medicine | Admitting: Internal Medicine

## 2022-08-27 DIAGNOSIS — I5042 Chronic combined systolic (congestive) and diastolic (congestive) heart failure: Secondary | ICD-10-CM | POA: Insufficient documentation

## 2022-08-27 DIAGNOSIS — L89154 Pressure ulcer of sacral region, stage 4: Secondary | ICD-10-CM | POA: Insufficient documentation

## 2022-08-27 DIAGNOSIS — I447 Left bundle-branch block, unspecified: Secondary | ICD-10-CM | POA: Diagnosis not present

## 2022-08-27 DIAGNOSIS — I110 Hypertensive heart disease with heart failure: Secondary | ICD-10-CM | POA: Diagnosis not present

## 2022-08-27 DIAGNOSIS — Z8249 Family history of ischemic heart disease and other diseases of the circulatory system: Secondary | ICD-10-CM | POA: Insufficient documentation

## 2022-08-27 DIAGNOSIS — I2510 Atherosclerotic heart disease of native coronary artery without angina pectoris: Secondary | ICD-10-CM | POA: Insufficient documentation

## 2022-08-27 DIAGNOSIS — Z951 Presence of aortocoronary bypass graft: Secondary | ICD-10-CM | POA: Insufficient documentation

## 2022-08-27 DIAGNOSIS — I7121 Aneurysm of the ascending aorta, without rupture: Secondary | ICD-10-CM | POA: Insufficient documentation

## 2022-08-27 DIAGNOSIS — Z953 Presence of xenogenic heart valve: Secondary | ICD-10-CM | POA: Insufficient documentation

## 2022-08-27 NOTE — Progress Notes (Signed)
Cardiology Office Note Date:  08/28/2022  Patient ID:  Joe, [name] [birth year], MRN [id number] PCP:  Willey Blade, MD  Cardiologist:  Dr. Oval Linsey Electrophysiologist: Dr. Quentin Ore   Chief Complaint: discuss CRT-D implant  History of Present Illness: Joe [name] is a 66 y.o. male with history of  CAD, VHD (severe AI), LBBB, chronic CHF (systolic), dilated AO root/ascending Ao   He was initially seen by Dr. Sophronia Simas service at his early September hospitalization, where he was to be discharged and then returned electively for CABG, aortic root replacement/AVR, and epicardial LV lead placement to be followed up with EP for CRT-D implant.   He underwent on 07/08/22 Aortic Root replacement with reimplantation of coronary buttons with a 29 mm KONECT Resilia valve conduit (Size 25m, model 11060A, SN [id number] 2.   CABG X 1 with RSVG from aortic graft to mid RCA   3.   Endoscopic greater saphenous vein harvest on the right 4.   Epicardial pacemaker leads x 2 (REF [id number] (REF [id number] Was planned for device implant prior to discharge though his hospitalization complicated by acute CHF, AF, ileus, urinary retention, and picc associated RUE DVT. Given prolonged hospitalization felt best to allow to discharge and allow recovery prior to CRT-D implant, as well as uninterrupted a/c post DCCV (07/23/22) There is mention of SR and junctional rhythm Discharged 08/13/22  He saw CTS APP 08/19/22, mostly bothered by sacral wound, difficult to sit, wound care center visit was pending (11/16), found to have a UTI and started on Bactrim, remained with a foley Had some ankle edema  He is accompanied by his mother in clinic today. She provides most of the history.  His main complaint is his sacral wound, he is uncomfortable sitting. Mother is performing dressing changes BID. There are no concerns for infection currently. He is scheduled to see wound clinic every 2 weeks. He  is also having bilateral hip pain. Saw ortho earlier this week, who thought increased pain was due to deconditioning and encouraged ambulation. He is walking regularly.  Foley removed earlier this week.   Denies syncope, presyncope, CP, palpitations. He is having some worsening BLL edema, has not been taking PRN lasix.   AAD Hx Amiodarone started Sept 2023 for Afib. No concerns with medication.   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   Murmur 05/26/2022   Osteoarthritis of left hip 01/16/2014   Osteoarthritis of right hip 05/15/2014   Primary localized osteoarthrosis of left shoulder 09/21/2017   PVC (premature ventricular contraction) 05/26/2022   Right ureteral stone    Speech impediment    Thoracic ascending aortic aneurysm ([place]    Umbilical hernia     Past Surgical History:  Procedure Laterality Date   ASCENDING AORTIC ROOT REPLACEMENT N/A 07/08/2022   Procedure: BENTALL PROCEDURE USING RESILIA KONECT 29MM CONDUIT;  Surgeon: WCoralie Common MD;  [place]  Service: Open Heart Surgery;  Laterality: N/A;   BACK  SURGERY     CARDIOVERSION N/A 07/23/2022   Procedure: CARDIOVERSION;  Surgeon: Jolaine Artist, MD;  Location[facility];  Service: Cardiovascular;  Laterality: N/A;   CORONARY ARTERY BYPASS GRAFT N/A 07/08/2022   Procedure: CORONARY ARTERY BYPASS GRAFTING (CABG) X 1, USING ENDOSCOPICALLY HARVESTED RIGHT LEG GREATER SAPHENOUS VEIN;  Surgeon: Coralie Common, MD;  Location: [place];  Service: Open Heart Surgery;  Laterality: N/A;   CYSTOSCOPY W/ URETERAL STENT  REMOVAL Right 04/19/2013   Procedure: CYSTOSCOPY WITH STENT REMOVAL;  Surgeon: Molli Hazard, MD;  Location: [facility];  Service: Urology;  Laterality: Right;   CYSTOSCOPY WITH RETROGRADE PYELOGRAM, URETEROSCOPY AND STENT PLACEMENT Right 04/19/2013   Procedure: CYSTOSCOPY WITH RETROGRADE PYELOGRAM, URETEROSCOPY ;  Surgeon: Molli Hazard, MD;  Location: [facility];  Service: Urology;  Laterality: Right;   CYSTOSCOPY WITH STENT PLACEMENT Right 03/24/2013   Procedure: CYSTOSCOPY WITH STENT PLACEMENT;  Surgeon: Hanley Ben, MD;  Location: [facility];  Service: Urology;  Laterality: Right;   EPICARDIAL PACING LEAD PLACEMENT N/A 07/08/2022   Procedure: EPICARDIAL PACING LEAD PLACEMENT;  Surgeon: Coralie Common, MD;  Location: [place];  Service: Thoracic;  Laterality: N/A;   HOLMIUM LASER APPLICATION Right 06/19/3381   Procedure: HOLMIUM LASER APPLICATION;  Surgeon: Molli Hazard, MD;  Location: [facility];  Service: Urology;  Laterality: Right;   INGUINAL HERNIA REPAIR Left 03-04-2005   POSTERIOR LUMBAR FUSION  08-27-2011   L4 -- L5   RIGHT HEART CATH AND CORONARY ANGIOGRAPHY N/A 06/29/2022   Procedure: RIGHT HEART CATH AND CORONARY ANGIOGRAPHY;  Surgeon: Sherren Mocha, MD;  Location: [place] CV LAB;  Service: Cardiovascular;  Laterality: N/A;   RIGHT SHOULDER HEMIARTHROPLASTY  08-26-2010   OA   SHOULDER HEMI-ARTHROPLASTY Left 09/21/2017   Procedure: SHOULDER HEMI-ARTHROPLASTY;  Surgeon: Marchia Bond, MD;  Location: [place];  Service: Orthopedics;  Laterality: Left;   TEE WITHOUT CARDIOVERSION N/A 07/08/2022   Procedure: TRANSESOPHAGEAL ECHOCARDIOGRAM (TEE);  Surgeon: Coralie Common, MD;  Location: [place];  Service: Open Heart Surgery;  Laterality: N/A;   TEE WITHOUT CARDIOVERSION N/A 07/23/2022   Procedure: TRANSESOPHAGEAL ECHOCARDIOGRAM (TEE);  Surgeon: Jolaine Artist, MD;  Location: [place];  Service: Cardiovascular;   Laterality: N/A;   TONSILLECTOMY  AS CHILD   TOTAL HIP ARTHROPLASTY Left 01/16/2014   Procedure: TOTAL HIP ARTHROPLASTY;  Surgeon: Johnny Bridge, MD;  Location: [place];  Service: Orthopedics;  Laterality: Left;   TOTAL HIP ARTHROPLASTY Right 05/15/2014   Procedure: RIGHT TOTAL HIP ARTHROPLASTY;  Surgeon: Johnny Bridge, MD;  Location: [place];  Service: Orthopedics;  Laterality: Right;   TOTAL SHOULDER ARTHROPLASTY Right 2011    Current Outpatient Medications  Medication Sig Dispense Refill   acetaminophen (TYLENOL) 325 MG tablet Take 650 mg by mouth every 6 (six) hours as needed for mild pain.     amiodarone (PACERONE) 200 MG tablet Take 1 tablet (200 mg total) by mouth daily. 30 tablet 1   apixaban (ELIQUIS) 5 MG TABS tablet Take 1 tablet (5 mg total) by mouth 2 (two) times daily. 60 tablet 1   aspirin EC 81 MG tablet Take 1 tablet (81 mg total) by mouth daily. Swallow whole. 30 tablet 12   atorvastatin (LIPITOR) 80 MG tablet Take 1 tablet (80 mg total) by mouth daily. 30 tablet 1   Cholecalciferol (VITAMIN D3) 250 MCG (10000 UT) capsule Take 10,000 Units by mouth daily.     Coenzyme Q10 (CO Q-10) 100  MG CAPS Take 100 mg by mouth daily.     fluticasone (FLONASE) 50 MCG/ACT nasal spray Place 2 sprays into both nostrils daily. (Patient taking differently: Place 2 sprays into both nostrils daily as needed for allergies.) 16 g 1   furosemide (LASIX) 20 MG tablet Take 2 tablets (40 mg total) by mouth as needed for edema (Take 2 tabs as needed for weight gain of 3lbs in 24 hours or 5 lbs in 48 hours). 60 tablet 1   leptospermum manuka honey (MEDIHONEY) PSTE paste Apply 1 Application topically daily. 15 mL 1   OVER THE COUNTER MEDICATION PRUNE JUICE/FLAXSEED     potassium chloride SA (KLOR-CON M) 20 MEQ tablet Take 1 tablet (20 mEq total) by mouth as needed (Take one tab on the days you require lasix). 30 tablet 1   sacubitril-valsartan (ENTRESTO) 49-51 MG Take 1 tablet by mouth 2 (two) times daily. 60  tablet 1   spironolactone (ALDACTONE) 25 MG tablet Take 0.5 tablets (12.5 mg total) by mouth daily. 15 tablet 5   tamsulosin (FLOMAX) 0.4 MG CAPS capsule Take 0.4 mg by mouth 2 (two) times daily.     traMADol (ULTRAM) 50 MG tablet Take 1 tablet (50 mg total) by mouth every 6 (six) hours as needed for moderate pain. 28 tablet 0   No current facility-administered medications for this visit.    Allergies:   Codeine and Levaquin [levofloxacin in d5w]   Social History:  The patient  reports that he has never smoked. He has never used smokeless tobacco. He reports that he does not drink alcohol and does not use drugs.   Family History:  The patient's family history includes Atrial fibrillation in his maternal grandmother and mother; Heart failure in his maternal grandmother and mother.  ROS:  Please see the history of present illness.    All other systems are reviewed and otherwise negative.   PHYSICAL EXAM:  VS:  BP (!) 102/58   Pulse 75   Ht '5\' 7"'$  (1.702 m)   Wt 163 lb (73.9 kg)   SpO2 97%   BMI 25.53 kg/m  BMI: Body mass index is 25.53 kg/m. Well nourished, well developed, in no acute distress HEENT: normocephalic, atraumatic Neck: no JVD, carotid bruits or masses Cardiac:  RRR; no significant murmurs, no rubs, or gallops Lungs:  CTA b/l, no wheezing, rhonchi or rales Abd: soft, nontender MS: no deformity or atrophy Ext: 2+ BLL edema up to mid-shin Skin: warm and dry, no rash Neuro:  No gross deficits appreciated Psych: euthymic mood, full affect   EKG:  Done today and reviewed by myself shows:  Accelerated junctional rhythm, regular, rate 75bpm Wide QRS, LBBB morphology - unchanged   08/19/22; TTE  1. Findings similar to previous 08/03/22.   2. Left ventricular ejection fraction, by estimation, is <20%. The left  ventricle has severely decreased function. The left ventricle demonstrates  global hypokinesis. The left ventricular internal cavity size was severely  dilated.  There is mild  concentric left ventricular hypertrophy. Left ventricular diastolic  parameters are consistent with Grade I diastolic dysfunction (impaired  relaxation).   3. Right ventricular systolic function is mildly reduced. The right  ventricular size is mildly enlarged. There is normal pulmonary artery  systolic pressure.   4. Left atrial size was severely dilated.   5. Right atrial size was mildly dilated.   6. The mitral valve is normal in structure. Mild mitral valve  regurgitation. No evidence of mitral stenosis.  7. The aortic valve has been repaired/replaced. Aortic valve  regurgitation is not visualized. No aortic stenosis is present. There is a  29 mm Resilia Konect Conduit Valve valve present in the aortic position.  Procedure Date: 07/08/22. Echo findings are  consistent with normal structure and function of the aortic valve  prosthesis.   8. Aortic root/ascending aorta has been repaired/replaced and dilatation  noted. There is mild dilatation of the aortic root, measuring 45 mm.    08/03/22: limited TTE  1. Left ventricular ejection fraction, by estimation, is 20 to 25%. The  left ventricle has severely decreased function. The left ventricle  demonstrates global hypokinesis with septal-lateral dyssynchrony. Left  ventricular diastolic parameters are  consistent with Grade I diastolic dysfunction (impaired relaxation).   2. Peak RV-RA gradient 15 mmHg. IVC not well-visualized. Right  ventricular systolic function is moderately reduced. The right ventricular  size is normal.   3. The mitral valve is normal in structure. Trivial mitral valve  regurgitation. No evidence of mitral stenosis.   4. Bioprosthetic aortic valve. Mean gradient 5 mmHg, no signficant  regurgitation noted. EOA 2.71 cm^2. There is a 29 mm Resilia Konect  Conduit valve present in the aortic position. Procedure Date: 07/08/2022.   5. The pulmonic valve was abnormal. Pulmonic valve regurgitation is   moderate.   6. Aortic root/ascending aorta has been repaired/replaced.    07/23/22: TEE  1. Left ventricular ejection fraction, by estimation, is 20 to 25%. The  left ventricle has severely decreased function. The left ventricle  demonstrates global hypokinesis. The left ventricular internal cavity size  was moderately dilated.   2. Right ventricular systolic function is moderately reduced. The right  ventricular size is normal.   3. Left atrial size was mildly dilated. No left atrial/left atrial  appendage thrombus was detected.   4. The mitral valve is normal in structure. Trivial mitral valve  regurgitation.   5. The aortic valve has been repaired/replaced. Aortic valve  regurgitation is not visualized.   6. There is mild (Grade II) plaque involving the descending aorta.    07/21/2022: TTE 1. Left ventricular ejection fraction, by estimation, is <20%. The left  ventricle has severely decreased function. The left ventricle demonstrates  global hypokinesis. The left ventricular internal cavity size was severely  dilated. Left ventricular  diastolic parameters are indeterminate. No LV thrombus noted.   2. Right ventricular systolic function is severely reduced. The right  ventricular size is mildly enlarged.   3. Left atrial size was severely dilated.   4. Right atrial size was mildly dilated.   5. A small pericardial effusion is present. The pericardial effusion is  circumferential. no evidence of cardiac tamponade.   6. The mitral valve is normal in structure. Moderate to severe mitral  valve regurgitation. No evidence of mitral stenosis. MR is ventricular  functional and related to severe LV dilation.   7. The aortic valve has been replaced s/p Bentall with a resilia Koect 29  mm Conduit. Aortic valve regurgitation is not visualized. No PVL, No  aortic stenosis is present.   8. Pulmonic valve regurgitation is severe. Mechanism appears secondary to  pulmonary artery  dilation (not well visualized)   9. Aortic root/ascending aorta has been repaired/replaced.   Comparison(s): LV size is further dilated, LV function is worse, RV  function is diminished from 07/08/22 study, PI not well visualized  interoperatively.   INTRAOPERATIVE TEE Result Date: 07/08/2022  *INTRAOPERATIVE TRANSESOPHAGEAL REPORT *  Patient Name:  Rumeal A Staunton Date of Exam: 07/08/2022 Medical Rec #:  [id number]       Height:       67.0 in Accession #:    [id number]      Weight:       157.0 lb Date of Birth:  03/16/1956       BSA:          1.82 m Patient Age:    59 years        BP:           89/52 mmHg Patient Gender: M               HR:           68 bpm. Exam Location:  Inpatient Transesophogeal exam was perform intraoperatively during surgical procedure. Patient was closely monitored under general anesthesia during the entirety of examination. Indications:     aortic root replacement Sonographer:     Johny Chess RDCS Performing Phys: Renold Don MD Diagnosing Phys: Renold Don MD Complications: No known complications during this procedure. POST-OP IMPRESSIONS _ Left Ventricle: The left ventricle is unchanged from pre-bypass. _ Right Ventricle: The right ventricle appears unchanged from pre-bypass. _ Aorta: A graft was placed in the ascending aorta for repair. _ Left Atrium: The left atrium appears unchanged from pre-bypass. _ Left Atrial Appendage: The left atrial appendage appears unchanged from pre-bypass. _ Aortic Valve: A bioprosthetic bioprosthetic valve was placed, leaflets are freely mobile and leaflets thin. No regurgitation post repair. No perivalvular leak noted. _ Mitral Valve: The mitral valve appears unchanged from pre-bypass. _ Tricuspid Valve: The tricuspid valve appears unchanged from pre-bypass. _ Pulmonic Valve: The pulmonic valve appears unchanged from pre-bypass. _ Interatrial Septum: The interatrial septum appears unchanged from pre-bypass. _ Interventricular Septum: The  interventricular septum appears unchanged from pre-bypass. _ Pericardium: The pericardium appears unchanged from pre-bypass. PRE-OP FINDINGS  Left Ventricle: The left ventricle has severely reduced systolic function, with an ejection fraction of 20-30%. The cavity size was severely dilated. Left ventrical global hypokinesis without regional wall motion abnormalities. There is mild concentric left ventricular hypertrophy. Right Ventricle: The right ventricle has normal systolic function. The cavity was normal. There is no increase in right ventricular wall thickness. Left Atrium: Left atrial size was dilated. No left atrial/left atrial appendage thrombus was detected. Right Atrium: Right atrial size was dilated. Interatrial Septum: No atrial level shunt detected by color flow Doppler. Pericardium: There is no evidence of pericardial effusion. Mitral Valve: The mitral valve is normal in structure. Mitral valve regurgitation is mild by color flow Doppler. There is No evidence of mitral stenosis. Tricuspid Valve: The tricuspid valve was normal in structure. Tricuspid valve regurgitation is trivial by color flow Doppler. Aortic Valve: The aortic valve is tricuspid Aortic valve regurgitation is severe by color flow Doppler. The jet is eccentric. There is no stenosis of the aortic valve. Pulmonic Valve: The pulmonic valve was not assessed. Pulmonic valve regurgitation was not assessed by color flow Doppler. Aorta: There is severe dilatation of the aortic root.  Renold Don MD Electronically signed by Renold Don MD Signature Date/Time: 07/08/2022/1:17:18 PM    Final    06/29/22: LHC 1.  Moderate proximal RCA stenosis and an angulated portion of the vessel, estimated at 60 to 70% 2.  Patent left main, LAD, and left circumflex with mild luminal irregularities 3.  Low left and right heart filling pressures with preserved cardiac output  Recent Labs: 06/25/2022: B Natriuretic Peptide 436.2  07/15/2022: ALT  25 08/11/2022: Hemoglobin 10.3; Magnesium 2.0; Platelets 259 08/13/2022: BUN 19; Creatinine, Ser 0.66; Potassium 4.3; Sodium 138  06/30/2022: Cholesterol 185; HDL 50; LDL Cholesterol 125; Total CHOL/HDL Ratio 3.7; Triglycerides 52; VLDL 10   Estimated Creatinine Clearance: 82.6 mL/min (by C-G formula based on SCr of 0.66 mg/dL).   Wt Readings from Last 3 Encounters:  08/28/22 163 lb (73.9 kg)  08/19/22 161 lb (73 kg)  08/13/22 156 lb 6.4 oz (70.9 kg)     Other studies reviewed: Additional studies/records reviewed today include: summarized above  ASSESSMENT AND PLAN:  Fairly new diagnosis of NICM CHF (systolic) LBBB Had an epicardial LV lead placed at time of his cardiac surgery Planned for CRT-D electively No c/o syncope or presyncope, no CP, palpitations. Good exercise tolerance. Open sacral wound - managed by wound care clinic Discussed with patient and mother infectious risk vs benefit of implanting device with known, open wounds Will continue to defer device placement at this time   He is having some increased edema, recommended he take his PRN lasix for the next few days, monitoring edema/weight Follow-up with HF clinic next week, as scheduled   CAD S/p CABG  Dilated Aortic root/ascending Ao AI S/p AVR/root replacement Has follow-up scheduled next week with CT surgery  7.   Paroxysmal Afib CHA2DS2Vasc is 3 (CHF, vasc dz, age), on Eliquis at '5mg'$  BID,  appropriately dosed   Disposition: F/u with Dr. Quentin Ore or EP APP in 6-8 wks  Current medicines are reviewed at length with the patient today.  The patient did not have any concerns regarding medicines.  Signed, Mamie Levers, NP   [place] [address] [place] [address] [phone] (office)  [phone] (fax)

## 2022-08-28 ENCOUNTER — Telehealth (HOSPITAL_COMMUNITY): Payer: Self-pay

## 2022-08-28 ENCOUNTER — Encounter: Payer: Self-pay | Admitting: Physician Assistant

## 2022-08-28 ENCOUNTER — Ambulatory Visit: Payer: Medicare Other | Attending: Physician Assistant | Admitting: Cardiology

## 2022-08-28 ENCOUNTER — Other Ambulatory Visit (HOSPITAL_COMMUNITY): Payer: Self-pay

## 2022-08-28 VITALS — BP 102/58 | HR 75 | Ht 67.0 in | Wt 163.0 lb

## 2022-08-28 DIAGNOSIS — I480 Paroxysmal atrial fibrillation: Secondary | ICD-10-CM

## 2022-08-28 DIAGNOSIS — S31000A Unspecified open wound of lower back and pelvis without penetration into retroperitoneum, initial encounter: Secondary | ICD-10-CM | POA: Insufficient documentation

## 2022-08-28 DIAGNOSIS — I493 Ventricular premature depolarization: Secondary | ICD-10-CM | POA: Diagnosis not present

## 2022-08-28 DIAGNOSIS — S31000D Unspecified open wound of lower back and pelvis without penetration into retroperitoneum, subsequent encounter: Secondary | ICD-10-CM

## 2022-08-28 DIAGNOSIS — I447 Left bundle-branch block, unspecified: Secondary | ICD-10-CM

## 2022-08-28 DIAGNOSIS — I5042 Chronic combined systolic (congestive) and diastolic (congestive) heart failure: Secondary | ICD-10-CM | POA: Diagnosis not present

## 2022-08-28 NOTE — Patient Instructions (Addendum)
Medication Instructions:   Your physician recommends that you continue on your current medications as directed. Please refer to the Current Medication list given to you today.  Take Lasix the next couple days, monitoring leg edema and weight.   *If you need a refill on your cardiac medications before your next appointment, please call your pharmacy*   Lab Work: [place]   If you have labs (blood work) drawn today and your tests are completely normal, you will receive your results only by: [place] (if you have MyChart) OR A paper copy in the mail If you have any lab test that is abnormal or we need to change your treatment, we will call you to review the results.   Testing/Procedures: NONE ORDERED  TODAY     Follow-Up: At [facility], you and your health needs are our priority.  As part of our continuing mission to provide you with exceptional heart care, we have created designated Provider Care Teams.  These Care Teams include your primary Cardiologist (physician) and Advanced Practice Providers (APPs -  Physician Assistants and Nurse Practitioners) who all work together to provide you with the care you need, when you need it.  We recommend signing up for the patient portal called "MyChart".  Sign up information is provided on this After Visit Summary.  MyChart is used to connect with patients for Virtual Visits (Telemedicine).  Patients are able to view lab/test results, encounter notes, upcoming appointments, etc.  Non-urgent messages can be sent to your provider as well.   To learn more about what you can do with MyChart, go to [email or web address].    Your next appointment:   6 week(s)  The format for your next appointment:   In Person  Provider:   You may see Dr Quentin Ore  or one of the following Advanced Practice Providers on your designated Care Team:   Tommye Standard, [place] Legrand Como "Jonni Sanger" Chalmers Cater, [place]   Other Instructions  Important  Information About [place]

## 2022-08-28 NOTE — Telephone Encounter (Signed)
Pt's ,mother Guerry Minors returned phone call and I received verbal permission to speak with her by the pt. She stated that pt is interested in the cardiac rehab program and stated that he has a bedsore that needs to heal before he can come I advised pt's mother that we have about a month waitlist right now and that we would call them back once he is ready for scheduling.

## 2022-08-29 NOTE — Progress Notes (Signed)
Joe [name] ([id number]) [id number] Page 1 of 7 Visit Report for 08/27/2022 Chief Complaint Document Details Patient Name: Date of Service: Joe [name], [place] [name]. 08/27/2022 8:00 [name] Medical Record Number: [id number] Patient Account Number: [id number] Date of Birth/Sex: Treating RN: Jan 05, 1956 (66 y.o. M) Primary Care Provider: Salena Saner Other Clinician: Referring Provider: Treating Provider/Extender: Modena Morrow in Treatment: 0 Information Obtained from: Patient Chief Complaint 08/27/2022; patient presents for sacral wound Electronic Signature(s) Signed: 08/27/2022 5:05:23 PM By: Kalman Shan DO Entered By: Kalman Shan on 08/27/2022 09:23:44 -------------------------------------------------------------------------------- HPI Details Patient Name: Date of Service: Joe [name], [place] [name]. 08/27/2022 8:00 [name] Medical Record Number: [id number] Patient Account Number: [id number] Date of Birth/Sex: Treating RN: 1956/05/22 (66 y.o. M) Primary Care Provider: Salena Saner Other Clinician: Referring Provider: Treating Provider/Extender: Modena Morrow in Treatment: 0 History of Present Illness HPI Description: 08/27/2022 [name] is [name] 66 year old male with [name] past medical history of chronic combined systolic diastolic heart failure, coronary artery disease status post CABG x1, left bundle branch block That presents to the clinic for [name] sacral ulcer. Patient was admitted to the hospital on 07/08/2022 For 6 weeks and discharged on 08/13/2022 For placement of [name] bioprosthetic aortic valve due to an ascending aortic aneurysm. During this admission patient states that he developed the sacral ulcer. He is not bedbound and ambulates with [name] walker. He is currently living with his mother and home health comes out for dressing changes. Currently he is using Medihoney every 3 days. There is mild  odor. Currently denies systemic signs of infection. Electronic Signature(s) Signed: 08/27/2022 5:05:23 PM By: Kalman Shan DO Entered By: Kalman Shan on 08/27/2022 09:46:56 -------------------------------------------------------------------------------- Physical Exam Details Patient Name: Date of Service: Joe Herald, [place] [name]. 08/27/2022 8:00 [name] Medical Record Number: [id number] Patient Account Number: [id number] Date of Birth/Sex: Treating RN: 1956/03/04 (66 y.o. M) Primary Care Provider: Salena Saner Other Clinician: Jacalyn Lefevre [name] ([id number]) [id number] Page 2 of 7 Referring Provider: Treating Provider/Extender: Providence Lanius Weeks in Treatment: 0 Constitutional respirations regular, non-labored and within target range for patient.. Cardiovascular 2+ dorsalis pedis/posterior tibialis pulses. Psychiatric pleasant and cooperative. Notes Sacral ulcer: Open wound with nonviable tissue throughout the wound bed. Serosanguineous drainage. Mild odor. No probing to bone. Significant undermining circumferentially. Electronic Signature(s) Signed: 08/27/2022 5:05:23 PM By: Kalman Shan DO Entered By: Kalman Shan on 08/27/2022 09:48:25 -------------------------------------------------------------------------------- Physician Orders Details Patient Name: Date of Service: Joe Herald, MA [place] [name]. 08/27/2022 8:00 [name] Medical Record Number: [id number] Patient Account Number: [id number] Date of Birth/Sex: Treating RN: 09/23/1956 (66 y.o. [place] Noe Primary Care Provider: Salena Saner Other Clinician: Referring Provider: Treating Provider/Extender: Modena Morrow in Treatment: 0 Verbal / Phone Orders: No Diagnosis Coding Follow-up Appointments ppointment in 2 weeks. - w/ Dr. Heber [place] Return [name] Other: - The Dakin's solution is being sent to your pharmacy. If your pharmacy doesn't have it,  you can go to [facility]. Anesthetic (In clinic) Topical Lidocaine 5% applied to wound bed Bathing/ Shower/ Hygiene May shower with protection but do not get wound dressing(s) wet. Off-Loading Turn and reposition every 2 hours [place] wound care orders this week; continue Home Health for wound care. May utilize formulary equivalent dressing for wound treatment orders unless otherwise specified. - Advance to change 3x [name] week. Pt.'s mother will change all other times. Wound Treatment Wound #1 - Sacrum Cleanser: Soap and  Water [facility]) 2 x Per Day/15 Days Discharge Instructions: May shower and wash wound with dial antibacterial soap and water prior to dressing change. Cleanser: Wound Cleanser (Home Health) 2 x Per Day/15 Days Discharge Instructions: Cleanse the wound with wound cleanser prior to applying [name] clean dressing using gauze sponges, not tissue or cotton balls. Peri-Wound Care: Skin Prep (Home Health) 2 x Per Day/15 Days Discharge Instructions: Use skin prep as directed Prim Dressing: Dakin's Solution 0.25%, 16 (oz) (DME) (Generic) 2 x Per Day/15 Days ary Discharge Instructions: Moisten gauze with Dakin's solution Secondary Dressing: ABD Pad, 5x9 (DME) (Generic) 2 x Per Day/15 Days Discharge Instructions: Apply over primary dressing as directed. Secured With: 94M Medipore H Soft Cloth Surgical Tape, 4 x 10 (in/yd) (DME) (Generic) 2 x Per Day/15 Days Schleich, Jago [name] ([id number]) [id number] Page 3 of 7 Discharge Instructions: Secure with tape as directed. Patient Medications llergies: codeine, Levaquin [name] Notifications Medication Indication Start End 08/27/2022 Dakin's Solution DOSE 1 - miscellaneous 0.125 % solution - moisten gauze for wet to dry dressings Electronic Signature(s) Signed: 08/27/2022 9:51:13 AM By: Kalman Shan DO Entered By: Kalman Shan on 08/27/2022  09:51:13 -------------------------------------------------------------------------------- Problem List Details Patient Name: Date of Service: Joe Herald, [place] [name]. 08/27/2022 8:00 [name] Medical Record Number: [id number] Patient Account Number: [id number] Date of Birth/Sex: Treating RN: 1956/04/28 (66 y.o. M) Primary Care Provider: Salena Saner Other Clinician: Referring Provider: Treating Provider/Extender: Modena Morrow in Treatment: 0 Active Problems ICD-10 Encounter Code Description Active Date MDM Diagnosis L89.154 Pressure ulcer of sacral region, stage 4 08/27/2022 No Yes I50.42 Chronic combined systolic (congestive) and diastolic (congestive) heart failure 08/27/2022 No Yes Z95.1 Presence of aortocoronary bypass graft 08/27/2022 No Yes Inactive Problems Resolved Problems Electronic Signature(s) Signed: 08/27/2022 5:05:23 PM By: Kalman Shan DO Entered By: Kalman Shan on 08/27/2022 09:22:35 -------------------------------------------------------------------------------- Progress Note Details Patient Name: Date of Service: Joe Herald, [place] [name]. 08/27/2022 8:00 [name] Medical Record Number: [id number] Patient Account Number: [id number] Date of Birth/Sex: Treating RN: [birth year] (66 y.o. M) Primary Care Provider: Salena Saner Other Clinician: Jacalyn Lefevre [name] ([id number]) [id number] Page 4 of 7 Referring Provider: Treating Provider/Extender: Modena Morrow in Treatment: 0 Subjective Chief Complaint Information obtained from Patient 08/27/2022; patient presents for sacral wound History of Present Illness (HPI) 08/27/2022 Mr. Loris Winrow is [name] 66 year old male with [name] past medical history of chronic combined systolic diastolic heart failure, coronary artery disease status post CABG x1, left bundle branch block That presents to the clinic for [name] sacral ulcer. Patient was admitted to the  hospital on 07/08/2022 For 6 weeks and discharged on 08/13/2022 For placement of [name] bioprosthetic aortic valve due to an ascending aortic aneurysm. During this admission patient states that he developed the sacral ulcer. He is not bedbound and ambulates with [name] walker. He is currently living with his mother and home health comes out for dressing changes. Currently he is using Medihoney every 3 days. There is mild odor. Currently denies systemic signs of infection. Patient History Allergies codeine (Reaction: nausea), Levaquin (Reaction: diarrhea) Family History Heart Disease - Mother,Maternal Grandparents. Social History Never smoker, Alcohol Use - Never, Drug Use - No History, Caffeine Use - Never. Medical History Cardiovascular Patient has history of Coronary Artery Disease, Hypertension Musculoskeletal Patient has history of Osteoarthritis - bilateral hips; left shoulder Hospitalization/Surgery History - cardioversions 07/2022. - CABG 07/08/2022. - hip replacements 2015. - 2018 left shoulder surgery. - epicardial pacing lead  placement 07/08/2022. Medical [name] Surgical History Notes nd Constitutional Symptoms (General Health) cognitive deficits congenital brain damage expressive speech delay Cardiovascular PVC LBBB thoracic ascending aortic aneurysm epicardial pacing lead placement Gastrointestinal umbilical hernia Genitourinary kidney stones Objective Constitutional respirations regular, non-labored and within target range for patient.. Vitals Time Taken: 8:00 AM, Height: 67 in, Source: Stated, Weight: 160 lbs, Source: Stated, BMI: 25.1, Temperature: 97.8 F, Pulse: 66 bpm, Respiratory Rate: 20 breaths/min, Blood Pressure: 101/65 mmHg. Cardiovascular 2+ dorsalis pedis/posterior tibialis pulses. Psychiatric pleasant and cooperative. General Notes: Sacral ulcer: Open wound with nonviable tissue throughout the wound bed. Serosanguineous drainage. Mild odor. No probing to bone.  Significant undermining circumferentially. Integumentary (Hair, Skin) Wound #1 status is Open. Original cause of wound was Pressure Injury. The date acquired was: 08/07/2022. The wound is located on the Sacrum. The wound measures 5cm length x 4.4cm width x 1.2cm depth; 17.279cm^2 area and 20.735cm^3 volume. There is muscle and Fat Layer (Subcutaneous Tissue) exposed. There is no tunneling noted, however, there is undermining starting at 10:00 and ending at 2:00 with [name] maximum distance of 2.5cm. There is [name] large amount of serosanguineous drainage noted. Foul odor after cleansing was noted. The wound margin is distinct with the outline attached to the wound base. There is small (1-33%) red, pink granulation within the wound bed. There is [name] large (67-100%) amount of necrotic tissue within the wound bed including Adherent Slough and Necrosis of Muscle. The periwound skin appearance did not exhibit: Callus, Crepitus, Excoriation, Induration, Rash, Scarring, Dry/Scaly, Maceration, Atrophie Blanche, Cyanosis, Ecchymosis, Hemosiderin Staining, Mottled, Pallor, Rubor, Erythema. The periwound has tenderness on palpation. Hilscher, Isiaha [name] ([id number]) [id number] Page 5 of 7 Assessment Active Problems ICD-10 Pressure ulcer of sacral region, stage 4 Chronic combined systolic (congestive) and diastolic (congestive) heart failure Presence of aortocoronary bypass graft Patient presents with [name] 6-week history of progressively worsening sacral ulcer secondary to pressure. He has been changing the dressings 3 times [name] week. This is not sufficient. I recommended Dakin's wet-to-dry dressings twice daily. We discussed the importance of aggressive offloading for wound healing. We will order an air mattress. He is able to offload by repositioning and he is mobile with [name] walker. Currently his mother is helping him with dressing changes. He also has home health. Follow-up in 2  weeks. Plan Follow-up Appointments: Return Appointment in 2 weeks. - w/ Dr. Heber Claude Other: - The Dakin's solution is being sent to your pharmacy. If your pharmacy doesn't have it, you can go to [facility]. Anesthetic: (In clinic) Topical Lidocaine 5% applied to wound bed Bathing/ Shower/ Hygiene: May shower with protection but do not get wound dressing(s) wet. Off-Loading: Turn and reposition every 2 hours Home Health: New wound care orders this week; continue Home Health for wound care. May utilize formulary equivalent dressing for wound treatment orders unless otherwise specified. - Advance to change 3x [name] week. Pt.'s mother will change all other times. WOUND #1: - Sacrum Wound Laterality: Cleanser: Soap and Water ([facility]) 2 x Per Day/15 Days Discharge Instructions: May shower and wash wound with dial antibacterial soap and water prior to dressing change. Cleanser: Wound Cleanser (Home Health) 2 x Per Day/15 Days Discharge Instructions: Cleanse the wound with wound cleanser prior to applying [name] clean dressing using gauze sponges, not tissue or cotton balls. Peri-Wound Care: Skin Prep (Home Health) 2 x Per Day/15 Days Discharge Instructions: Use skin prep as directed Prim Dressing: Dakin's Solution 0.25%, 16 (oz) (DME) (Generic) 2 x Per Day/15 Days ary  Discharge Instructions: Moisten gauze with Dakin's solution Secondary Dressing: ABD Pad, 5x9 (DME) (Generic) 2 x Per Day/15 Days Discharge Instructions: Apply over primary dressing as directed. Secured With: 10M Medipore H Soft Cloth Surgical T ape, 4 x 10 (in/yd) (DME) (Generic) 2 x Per Day/15 Days Discharge Instructions: Secure with tape as directed. 1. Dakin's wet-to-dry dressings 2. Aggressive offloading 3. Follow-up in 2 weeks Electronic Signature(s) Signed: 08/27/2022 5:05:23 PM By: Kalman Shan DO Entered By: Kalman Shan on 08/27/2022  09:49:51 -------------------------------------------------------------------------------- HxROS Details Patient Name: Date of Service: Joe Herald, [place] [name]. 08/27/2022 8:00 [name] Medical Record Number: [id number] Patient Account Number: [id number] Date of Birth/Sex: Treating RN: 03-13-1956 (66 y.o. [name] Primary Care Provider: Salena Saner Other Clinician: Referring Provider: Treating Provider/Extender: Modena Morrow in Treatment: 0 Constitutional Symptoms (General Health) Medical History: Past Medical History Notes: cognitive deficits congenital brain damage Melick, [place] [name] ([id number]) [id number] Page 6 of 7 expressive speech delay Cardiovascular Medical History: Positive for: Coronary Artery Disease; Hypertension Past Medical History Notes: PVC LBBB thoracic ascending aortic aneurysm epicardial pacing lead placement Gastrointestinal Medical History: Past Medical History Notes: umbilical hernia Genitourinary Medical History: Past Medical History Notes: kidney stones Musculoskeletal Medical History: Positive for: Osteoarthritis - bilateral hips; left shoulder Immunizations Pneumococcal Vaccine: Received Pneumococcal Vaccination: No Implantable Devices No devices added Hospitalization / Surgery History Type of Hospitalization/Surgery cardioversions 07/2022 CABG 07/08/2022 hip replacements 2015 2018 left shoulder surgery epicardial pacing lead placement 07/08/2022 Family and Social History Heart Disease: Yes - Mother,Maternal Grandparents; Never smoker; Alcohol Use: Never; Drug Use: No History; Caffeine Use: Never; Financial Concerns: No; Food, Clothing or Shelter Needs: No; Support System Lacking: No; Transportation Concerns: No Electronic Signature(s) Signed: 08/27/2022 5:05:23 PM By: Kalman Shan DO Signed: 08/28/2022 5:21:52 PM By: Deon Pilling RN, BSN Entered By: Deon Pilling on 08/27/2022  07:48:14 -------------------------------------------------------------------------------- SuperBill Details Patient Name: Date of Service: Joe Herald, [place] [name]. 08/27/2022 Medical Record Number: [id number] Patient Account Number: [id number] Date of Birth/Sex: Treating RN: December 08, 1955 (66 y.o. Burnadette Pop, Lauren Primary Care Provider: Salena Saner Other Clinician: Referring Provider: Treating Provider/Extender: Providence Lanius Weeks in Treatment: 0 Diagnosis Coding ICD-10 Codes Code Description [place], [place] ([id number]) [phone].pdf Page 7 of 7 L89.154 Pressure ulcer of sacral region, stage 4 I50.42 Chronic combined systolic (congestive) and diastolic (congestive) heart failure Z95.1 Presence of aortocoronary bypass graft Facility Procedures : CPT4 Code: [id number] Description: 99214 - WOUND CARE VISIT-LEV 4 EST PT Modifier: Quantity: 1 Physician Procedures : CPT4 Code Description Modifier [id number] - WC PHYS LEVEL 4 - NEW PT ICD-10 Diagnosis Description L89.154 Pressure ulcer of sacral region, stage 4 Z95.1 Presence of aortocoronary bypass graft I50.42 Chronic combined systolic (congestive) and  diastolic (congestive) heart failure Quantity: 1 Electronic Signature(s) Signed: 08/27/2022 5:05:23 PM By: Kalman Shan DO Entered By: Kalman Shan on 08/27/2022 09:50:09

## 2022-08-29 NOTE — Progress Notes (Signed)
Weisse, Javyn A ([id number]) [id number] Page 1 of 4 Visit Report for 08/27/2022 Abuse Risk Screen Details Patient Name: Date of Service: Joe [name], [place] RK A. 08/27/2022 8:00 A M Medical Record Number: [id number] Patient Account Number: [id number] Date of Birth/Sex: Treating RN: Jun 02, 1956 (66 y.o. Joe [name] Primary Care Perla Echavarria: Salena Saner Other Clinician: Referring Cason Luffman: Treating Joe [name]/Extender: Modena Morrow in Treatment: 0 Abuse Risk Screen Items Answer ABUSE RISK SCREEN: Has anyone close to you tried to hurt or harm you recentlyo No Do you feel uncomfortable with anyone in your familyo No Has anyone forced you do things that you didnt want to doo No Electronic Signature(s) Signed: 08/28/2022 5:21:52 PM By: Deon Pilling RN, BSN Entered By: Deon Pilling on 08/27/2022 08:04:18 -------------------------------------------------------------------------------- Activities of Daily Living Details Patient Name: Date of Service: Joe [name], [place] RK A. 08/27/2022 8:00 A M Medical Record Number: [id number] Patient Account Number: [id number] Date of Birth/Sex: Treating RN: 1956-01-28 (66 y.o. Joe [name] Primary Care Ryen Heitmeyer: Salena Saner Other Clinician: Referring Orvetta Danielski: Treating Joe [name]/Extender: Modena Morrow in Treatment: 0 Activities of Daily Living Items Answer Activities of Daily Living (Please select one for each item) Drive Automobile Not Able T Medications ake Completely Able Use T elephone Completely Able Care for Appearance Completely Able Use T oilet Completely Able Bath / Shower Completely Able Dress Self Completely Able Feed Self Completely Able Walk Need Assistance Get In / Out Bed Completely Able Housework Completely Able Prepare Meals Completely [place] for Self Completely Able Electronic Signature(s) Signed:  08/28/2022 5:21:52 PM By: Deon Pilling RN, BSN Entered By: Deon Pilling on 08/27/2022 08:04:37 [place], [place] ([id number]) [id number] Page 2 of 4 -------------------------------------------------------------------------------- Education Screening Details Patient Name: Date of Service: Joe [name], [place] RK A. 08/27/2022 8:00 A M Medical Record Number: [id number] Patient Account Number: [id number] Date of Birth/Sex: Treating RN: April 15, 1956 (66 y.o. Joe [name], [place] Primary Care Rion Catala: Salena Saner Other Clinician: Referring Hiilei Gerst: Treating Yamilee Harmes/Extender: Modena Morrow in Treatment: 0 Primary Learner Assessed: Caregiver mother and patient Reason Patient is not Primary Learner: cognitive delay Learning Preferences/Education Level/Primary Language Learning Preference: Explanation, Demonstration, Printed Material Highest Education Level: High School Preferred Language: English Cognitive Barrier Language Barrier: No Translator Needed: No Memory Deficit: No Emotional Barrier: No Cultural/Religious Beliefs Affecting Medical Care: No Physical Barrier Impaired Vision: Yes Glasses, reading Impaired Hearing: No Decreased Hand dexterity: No Knowledge/Comprehension Knowledge Level: Medium Comprehension Level: Medium Ability to understand written instructions: Medium Ability to understand verbal instructions: Medium Motivation Anxiety Level: Calm Cooperation: Cooperative Education Importance: Acknowledges Need Interest in Health Problems: Asks Questions Perception: Coherent Willingness to Engage in Self-Management High Activities: Readiness to Engage in Self-Management High Activities: Electronic Signature(s) Signed: 08/28/2022 5:21:52 PM By: Deon Pilling RN, BSN Entered By: Deon Pilling on 08/27/2022 08:05:36 -------------------------------------------------------------------------------- Fall Risk Assessment  Details Patient Name: Date of Service: Joe Herald, [place] A. 08/27/2022 8:00 A M Medical Record Number: [id number] Patient Account Number: [id number] Date of Birth/Sex: Treating RN: Aug 27, 1956 (66 y.o. Joe [name] Primary Care Olia Hinderliter: Salena Saner Other Clinician: Referring Ulah Olmo: Treating Joe [name]/Extender: [place] Lanius Weeks in Treatment: 0 Fall Risk Assessment Items Kann, Verland A ([id number]) [id number] Page 3 of 4 Have you had 2 or more falls in the last 12 monthso 0 No Have you had any fall that resulted in injury in the last 12 monthso 0 No FALLS RISK SCREEN History  of falling - immediate or within 3 months 0 No Secondary diagnosis (Do you have 2 or more medical diagnoseso) 0 No Ambulatory aid None/bed rest/wheelchair/nurse 0 No Crutches/cane/walker 15 Yes Furniture 0 No Intravenous therapy Access/Saline/Heparin Lock 0 No Gait/Transferring Normal/ bed rest/ wheelchair 0 No Weak (short steps with or without shuffle, stooped but able to lift head while walking, may seek 10 Yes support from furniture) Impaired (short steps with shuffle, may have difficulty arising from chair, head down, impaired 0 No balance) Mental Status Oriented to own ability 0 Yes Electronic Signature(s) Signed: 08/28/2022 5:21:52 PM By: Deon Pilling RN, BSN Entered By: Deon Pilling on 08/27/2022 08:05:50 -------------------------------------------------------------------------------- Foot Assessment Details Patient Name: Date of Service: Joe [name], [place] RK A. 08/27/2022 8:00 A M Medical Record Number: [id number] Patient Account Number: [id number] Date of Birth/Sex: Treating RN: 1956-06-07 (66 y.o. Joe [name] Primary Care Nelda Luckey: Salena Saner Other Clinician: Referring Virgel Haro: Treating Joe [name]/Extender: Modena Morrow in Treatment: 0 Foot Assessment Items Site Locations + = Sensation  present, - = Sensation absent, C = Callus, U = Ulcer R = Redness, W = Warmth, M = Maceration, PU = Pre-ulcerative lesion F = Fissure, S = Swelling, D = Dryness Assessment Right: Left: Other Deformity: No No Prior Foot Ulcer: No No Prior Amputation: No No Charcot Joint: No No Ambulatory Status: Gait: Bruster, Abdifatah A ([id number]) [id number] Page 4 of 4 Notes NO BLE wounds. Electronic Signature(s) Signed: 08/28/2022 5:21:52 PM By: Deon Pilling RN, BSN Entered By: Deon Pilling on 08/27/2022 08:06:27 -------------------------------------------------------------------------------- Nutrition Risk Screening Details Patient Name: Date of Service: Joe Herald, [name] 08/27/2022 8:00 A M Medical Record Number: [id number] Patient Account Number: [id number] Date of Birth/Sex: Treating RN: 1956/01/28 (66 y.o. Joe [name], [place] Primary Care Philip Eckersley: Salena Saner Other Clinician: Referring Zoey Gilkeson: Treating Jenefer Woerner/Extender: Modena Morrow in Treatment: 0 Height (in): 67 Weight (lbs): 160 Body Mass Index (BMI): 25.1 Nutrition Risk Screening Items Score Screening NUTRITION RISK SCREEN: I have an illness or condition that made me change the kind and/or amount of food I eat 2 Yes I eat fewer than two meals per day 0 No I eat few fruits and vegetables, or milk products 0 No I have three or more drinks of beer, liquor or wine almost every day 0 No I have tooth or mouth problems that make it hard for me to eat 0 No I don't always have enough money to buy the food I need 0 No I eat alone most of the time 0 No I take three or more different prescribed or over-the-counter drugs a day 1 Yes Without wanting to, I have lost or gained 10 pounds in the last six months 0 No I am not always physically able to shop, cook and/or feed myself 0 No Nutrition Protocols Good Risk Protocol Moderate Risk Protocol 0 Provide education on  nutrition High Risk Proctocol Risk Level: Moderate Risk Score: 3 Electronic Signature(s) Signed: 08/28/2022 5:21:52 PM By: Deon Pilling RN, BSN Entered By: Deon Pilling on 08/27/2022 08:05:59

## 2022-08-29 NOTE — Progress Notes (Addendum)
Mand, Halden A ([id number]) [id number] Page 1 of 9 Visit Report for 08/27/2022 Allergy List Details Patient Name: Date of Service: Joe [name], [place] RK A. 08/27/2022 8:00 A M Medical Record Number: [id number] Patient Account Number: [id number] Date of Birth/Sex: Treating RN: [birth year] (66 y.o. [name] Primary Care Tionne Dayhoff: Salena Saner Other Clinician: Referring Malone Admire: Treating Loyd Marhefka/Extender: Christian Mate Weeks in Treatment: 0 Allergies Active Allergies codeine Reaction: nausea Levaquin Reaction: diarrhea Allergy Notes Electronic Signature(s) Signed: 08/28/2022 5:21:52 PM By: Deon Pilling RN, BSN Entered By: Deon Pilling on 08/27/2022 07:42:36 -------------------------------------------------------------------------------- Arrival Information Details Patient Name: Date of Service: Joe Herald, [name] 08/27/2022 8:00 A M Medical Record Number: [id number] Patient Account Number: [id number] Date of Birth/Sex: Treating RN: [birth year] (66 y.o. [name] Primary Care Keilani Terrance: Salena Saner Other Clinician: Referring Joachim Carton: Treating Nickey Canedo/Extender: Ether Griffins in Treatment: 0 Visit Information Patient Arrived: Gilford Rile Arrival Time: 08:02 Accompanied By: mother Transfer Assistance: None Patient Identification Verified: Yes Secondary Verification Process Completed: Yes Patient Requires Transmission-Based Precautions: No Patient Has Alerts: Yes Patient Alerts: Patient on Blood Thinner Electronic Signature(s) Signed: 08/28/2022 5:21:52 PM By: Deon Pilling RN, BSN Entered By: Deon Pilling on 08/27/2022 08:02:19 [place], [place] ([id number]) [id number] Page 2 of 9 -------------------------------------------------------------------------------- Clinic Level of Care Assessment Details Patient Name: Date of Service: Joe [name], [place] RK  A. 08/27/2022 8:00 A M Medical Record Number: [id number] Patient Account Number: [id number] Date of Birth/Sex: Treating RN: [birth year] (66 y.o. Burnadette Pop, Lauren Primary Care Arnulfo Batson: Salena Saner Other Clinician: Referring Dixon Luczak: Treating Kayn Haymore/Extender: Ether Griffins in Treatment: 0 Clinic Level of Care Assessment Items TOOL 4 Quantity Score X- 1 0 Use when only an EandM is performed on FOLLOW-UP visit ASSESSMENTS - Nursing Assessment / Reassessment X- 1 10 Reassessment of Co-morbidities (includes updates in patient status) X- 1 5 Reassessment of Adherence to Treatment Plan ASSESSMENTS - Wound and Skin A ssessment / Reassessment X - Simple Wound Assessment / Reassessment - one wound 1 5 '[]'$  - 0 Complex Wound Assessment / Reassessment - multiple wounds '[]'$  - 0 Dermatologic / Skin Assessment (not related to wound area) ASSESSMENTS - Focused Assessment '[]'$  - 0 Circumferential Edema Measurements - multi extremities '[]'$  - 0 Nutritional Assessment / Counseling / Intervention '[]'$  - 0 Lower Extremity Assessment (monofilament, tuning fork, pulses) '[]'$  - 0 Peripheral Arterial Disease Assessment (using hand held doppler) ASSESSMENTS - Ostomy and/or Continence Assessment and Care '[]'$  - 0 Incontinence Assessment and Management '[]'$  - 0 Ostomy Care Assessment and Management (repouching, etc.) PROCESS - Coordination of Care '[]'$  - 0 Simple Patient / Family Education for ongoing care X- 1 20 Complex (extensive) Patient / Family Education for ongoing care X- 1 10 Staff obtains Programmer, systems, Records, T Results / Process Orders est X- 1 10 Staff telephones HHA, Nursing Homes / Clarify orders / etc '[]'$  - 0 Routine Transfer to another Facility (non-emergent condition) '[]'$  - 0 Routine Hospital Admission (non-emergent condition) X- 1 15 New Admissions / Biomedical engineer / Ordering NPWT Apligraf, etc. , '[]'$  - 0 Emergency Hospital Admission (emergent  condition) '[]'$  - 0 Simple Discharge Coordination X- 1 15 Complex (extensive) Discharge Coordination PROCESS - Special Needs '[]'$  - 0 Pediatric / Minor Patient Management '[]'$  - 0 Isolation Patient Management '[]'$  - 0 Hearing / Language / Visual special needs '[]'$  - 0 Assessment of Community assistance (transportation, D/C planning, etc.) '[]'$  - 0 Additional assistance / Altered mentation '[]'$  -  0 Support Surface(s) Assessment (bed, cushion, seat, etc.) INTERVENTIONS - Wound Cleansing / Measurement [name], [name] ([id number]) [id number] Page 3 of 9 X- 1 5 Simple Wound Cleansing - one wound '[]'$  - 0 Complex Wound Cleansing - multiple wounds X- 1 5 Wound Imaging (photographs - any number of wounds) '[]'$  - 0 Wound Tracing (instead of photographs) X- 1 5 Simple Wound Measurement - one wound '[]'$  - 0 Complex Wound Measurement - multiple wounds INTERVENTIONS - Wound Dressings '[]'$  - 0 Small Wound Dressing one or multiple wounds X- 1 15 Medium Wound Dressing one or multiple wounds '[]'$  - 0 Large Wound Dressing one or multiple wounds X- 1 5 Application of Medications - topical '[]'$  - 0 Application of Medications - injection INTERVENTIONS - Miscellaneous '[]'$  - 0 External ear exam '[]'$  - 0 Specimen Collection (cultures, biopsies, blood, body fluids, etc.) '[]'$  - 0 Specimen(s) / Culture(s) sent or taken to Lab for analysis '[]'$  - 0 Patient Transfer (multiple staff / Civil Service fast streamer / Similar devices) '[]'$  - 0 Simple Staple / Suture removal (25 or less) '[]'$  - 0 Complex Staple / Suture removal (26 or more) '[]'$  - 0 Hypo / Hyperglycemic Management (close monitor of Blood Glucose) '[]'$  - 0 Ankle / Brachial Index (ABI) - do not check if billed separately X- 1 5 Vital Signs Has the patient been seen at the hospital within the last three years: Yes Total Score: 130 Level Of Care: New/Established - Level 4 Electronic Signature(s) Signed: 08/28/2022 12:09:11 PM By: Rhae Hammock  RN Entered By: Rhae Hammock on 08/27/2022 08:55:24 -------------------------------------------------------------------------------- Encounter Discharge Information Details Patient Name: Date of Service: Joe Herald, [name] 08/27/2022 8:00 A M Medical Record Number: [id number] Patient Account Number: [id number] Date of Birth/Sex: Treating RN: March 24, 1956 (66 y.o. Burnadette Pop, Lauren Primary Care Kayann Maj: Salena Saner Other Clinician: Referring Lindsey Hommel: Treating Mikiah Durall/Extender: Ether Griffins in Treatment: 0 Encounter Discharge Information Items Discharge Condition: Stable Ambulatory Status: Wheelchair Discharge Destination: Home Transportation: Private Auto Accompanied By: self Schedule Follow-up Appointment: Yes Clinical Summary of Care: Patient Declined Electronic Signature(s) Signed: 10/14/2022 5:36:15 PM By: Rhae Hammock RN [place], [place] ([id number]) [phone].pdf Page 4 of 9 Entered By: Rhae Hammock on 09/09/2022 14:16:38 -------------------------------------------------------------------------------- Lower Extremity Assessment Details Patient Name: Date of Service: Joe [name], [place] RK A. 08/27/2022 8:00 A M Medical Record Number: [id number] Patient Account Number: [id number] Date of Birth/Sex: Treating RN: 04-02-1956 (66 y.o. [name] Primary Care Cherysh Epperly: Salena Saner Other Clinician: Referring Ayyub Krall: Treating Elzina Devera/Extender: Ether Griffins in Treatment: 0 Electronic Signature(s) Signed: 08/28/2022 5:21:52 PM By: Deon Pilling RN, BSN Entered By: Deon Pilling on 08/27/2022 08:06:32 -------------------------------------------------------------------------------- Multi Wound Chart Details Patient Name: Date of Service: Joe Herald, [name] 08/27/2022 8:00 A M Medical Record Number: [id number] Patient Account Number: [id number] Date of Birth/Sex:  Treating RN: 06-04-1956 (66 y.o. M) Primary Care Ramisa Duman: Salena Saner Other Clinician: Referring Takiesha Mcdevitt: Treating Darron Stuck/Extender: Christian Mate Weeks in Treatment: 0 Vital Signs Height(in): 67 Pulse(bpm): 8 Weight(lbs): 160 Blood Pressure(mmHg): 101/65 Body Mass Index(BMI): 25.1 Temperature(F): 97.8 Respiratory Rate(breaths/min): 20 [1:Photos:] [N/A:N/A] Sacrum N/A N/A Wound Location: Pressure Injury N/A N/A Wounding Event: Pressure Ulcer N/A N/A Primary Etiology: Coronary Artery Disease, N/A N/A Comorbid History: Hypertension, Osteoarthritis 08/07/2022 N/A N/A Date Acquired: 0 N/A N/A Weeks of Treatment: Open N/A N/A Wound Status: No N/A N/A Wound Recurrence: 5x4.4x1.2 N/A N/A Measurements L x W x D (cm) 17.279 N/A  N/A A (cm) : rea 20.735 N/A N/A Volume (cm) : 10 Starting Position 1 (o'clock): 2 Ending Position 1 (o'clock): 2.5 Maximum Distance 1 (cm): Yes N/A N/A Undermining: Category/Stage IV N/A N/A Classification: Large N/A N/A Exudate A mount: [name], [name] ([id number]) [id number] Page 5 of 9 Serosanguineous N/A N/A Exudate Type: red, brown N/A N/A Exudate Color: Yes N/A N/A Foul Odor A Cleansing: fter No N/A N/A Odor Anticipated Due to Product Use: Distinct, outline attached N/A N/A Wound Margin: Small (1-33%) N/A N/A Granulation A mount: Red, Pink N/A N/A Granulation Quality: Large (67-100%) N/A N/A Necrotic Amount: Fat Layer (Subcutaneous Tissue): Yes N/A N/A Exposed Structures: Muscle: Yes Fascia: No Tendon: No Joint: No Bone: No Small (1-33%) N/A N/A Epithelialization: Excoriation: No N/A N/A Periwound Skin Texture: Induration: No Callus: No Crepitus: No Rash: No Scarring: No Maceration: No N/A N/A Periwound Skin Moisture: Dry/Scaly: No Atrophie Blanche: No N/A N/A Periwound Skin Color: Cyanosis: No Ecchymosis: No Erythema: No Hemosiderin  Staining: No Mottled: No Pallor: No Rubor: No Yes N/A N/A Tenderness on Palpation: Treatment Notes Electronic Signature(s) Signed: 08/27/2022 5:05:23 PM By: Kalman Shan DO Entered By: Kalman Shan on 08/27/2022 09:22:47 -------------------------------------------------------------------------------- Multi-Disciplinary Care Plan Details Patient Name: Date of Service: Joe Herald, [name] 08/27/2022 8:00 A M Medical Record Number: [id number] Patient Account Number: [id number] Date of Birth/Sex: Treating RN: 1956-06-01 (66 y.o. Burnadette Pop, Lauren Primary Care Ambrea Hegler: Salena Saner Other Clinician: Referring Zyasia Halbleib: Treating Zaliah Wissner/Extender: Ether Griffins in Treatment: 0 Active Inactive Orientation to the Wound Care Program Nursing Diagnoses: Knowledge deficit related to the wound healing center program Goals: Patient/caregiver will verbalize understanding of the [facility] Program Date Initiated: 08/27/2022 Target Resolution Date: 09/19/2022 Goal Status: Active Interventions: Provide education on orientation to the wound center Notes: Wound/Skin Impairment Nursing Diagnoses: Impaired tissue integrity [name], [name] ([id number]) [id number] Page 6 of 9 Knowledge deficit related to ulceration/compromised skin integrity Goals: Patient will have a decrease in wound volume by X% from date: (specify in notes) Date Initiated: 08/27/2022 Target Resolution Date: 09/18/2022 Goal Status: Active Patient/caregiver will verbalize understanding of skin care regimen Date Initiated: 08/27/2022 Target Resolution Date: 09/19/2022 Goal Status: Active Ulcer/skin breakdown will have a volume reduction of 30% by week 4 Date Initiated: 08/27/2022 Target Resolution Date: 09/18/2022 Goal Status: Active Interventions: Assess patient/caregiver ability to obtain necessary supplies Assess patient/caregiver ability  to perform ulcer/skin care regimen upon admission and as needed Assess ulceration(s) every visit Notes: Electronic Signature(s) Signed: 08/28/2022 12:09:11 PM By: Rhae Hammock RN Entered By: Rhae Hammock on 08/27/2022 08:26:14 -------------------------------------------------------------------------------- Pain Assessment Details Patient Name: Date of Service: Joe [name], [place] RK A. 08/27/2022 8:00 A M Medical Record Number: [id number] Patient Account Number: [id number] Date of Birth/Sex: Treating RN: [birth year] (66 y.o. [name] Primary Care [place] Schweppe: Salena Saner Other Clinician: Referring Brennen Gardiner: Treating [place] Mullett/Extender: Ether Griffins in Treatment: 0 Active Problems Location of Pain Severity and Description of Pain Patient Has Paino Yes Site Locations Pain Location: Pain in Ulcers Rate the pain. Current Pain Level: 9 Pain Management and Medication Current Pain Management: Medication: No Cold Application: No Rest: No Massage: No Activity: No T.E.N.S.: No Heat Application: No Leg drop or elevation: No Is the Current Pain Management Adequate: Adequate How does your wound impact your activities of daily livingo Sleep: No Bathing: No Appetite: No Relationship With Others: No [name], [name] ([id number]) [id number] Page 7 of 9 Bladder Continence: No Emotions: No  Bowel Continence: No Work: No Toileting: No Drive: No Dressing: No Hobbies: No Engineer, maintenance) Signed: 08/28/2022 5:21:52 PM By: Deon Pilling RN, BSN Entered By: Deon Pilling on 08/27/2022 08:06:15 -------------------------------------------------------------------------------- Patient/Caregiver Education Details Patient Name: Date of Service: Joe Herald, [name] 11/16/2023andnbsp8:00 A M Medical Record Number: [id number] Patient Account Number: [id number] Date of Birth/Gender: Treating RN: 1956-07-20 (66 y.o. Erie Noe Primary Care Physician: Salena Saner Other Clinician: Referring Physician: Treating Physician/Extender: Ether Griffins in Treatment: 0 Education Assessment Education Provided To: Patient Education Topics Provided Welcome T [facility] [place]: o Methods: Explain/Verbal Responses: Reinforcements needed, State content correctly Electronic Signature(s) Signed: 08/28/2022 12:09:11 PM By: Rhae Hammock RN Entered By: Rhae Hammock on 08/27/2022 08:26:24 -------------------------------------------------------------------------------- Wound Assessment Details Patient Name: Date of Service: Joe Herald, [name] 08/27/2022 8:00 A M Medical Record Number: [id number] Patient Account Number: [id number] Date of Birth/Sex: Treating RN: [birth year] (66 y.o. [name] Primary Care Rusty Villella: Salena Saner Other Clinician: Referring Breiona Couvillon: Treating Ruthene Methvin/Extender: Christian Mate Weeks in Treatment: 0 Wound Status Wound Number: 1 Primary Etiology: Pressure Ulcer Wound Location: Sacrum Wound Status: Open Wounding Event: Pressure Injury Comorbid History: Coronary Artery Disease, Hypertension, Osteoarthritis Date Acquired: 08/07/2022 Weeks Of Treatment: 0 Clustered Wound: No Photos [name], [name] ([id number]) [id number] Page 8 of 9 Wound Measurements Length: (cm) 5 Width: (cm) 4.4 Depth: (cm) 1.2 Area: (cm) 17.279 Volume: (cm) 20.735 % Reduction in Area: % Reduction in Volume: Epithelialization: Small (1-33%) Tunneling: No Undermining: Yes Starting Position (o'clock): 10 Ending Position (o'clock): 2 Maximum Distance: (cm) 2.5 Wound Description Classification: Category/Stage IV Wound Margin: Distinct, outline attached Exudate Amount: Large Exudate Type: Serosanguineous Exudate Color: red, brown Foul Odor After Cleansing: Yes Due to Product Use:  No Slough/Fibrino Yes Wound Bed Granulation Amount: Small (1-33%) Exposed Structure Granulation Quality: Red, Pink Fascia Exposed: No Necrotic Amount: Large (67-100%) Fat Layer (Subcutaneous Tissue) Exposed: Yes Necrotic Quality: Adherent Slough Tendon Exposed: No Muscle Exposed: Yes Necrosis of Muscle: Yes Joint Exposed: No Bone Exposed: No Periwound Skin Texture Texture Color No Abnormalities Noted: No No Abnormalities Noted: No Callus: No Atrophie Blanche: No Crepitus: No Cyanosis: No Excoriation: No Ecchymosis: No Induration: No Erythema: No Rash: No Hemosiderin Staining: No Scarring: No Mottled: No Pallor: No Moisture Rubor: No No Abnormalities Noted: No Dry / Scaly: No Temperature / Pain Maceration: No Tenderness on Palpation: Yes Electronic Signature(s) Signed: 08/28/2022 5:21:52 PM By: Deon Pilling RN, BSN Entered By: Deon Pilling on 08/27/2022 08:14:40 -------------------------------------------------------------------------------- Vitals Details Patient Name: Date of Service: Joe Herald, [name] 08/27/2022 8:00 A M Medical Record Number: [id number] Patient Account Number: [id number] Date of Birth/Sex: Treating RN: 07-31-1956 (66 y.o. [name] Primary Care Naydeen Speirs: Salena Saner Other Clinician: Referring Darnelle Derrick: Treating Zayvon Alicea/Extender: Christian Mate [place], [place] ([id number]) [phone].pdf Page 9 of 9 Weeks in Treatment: 0 Vital Signs Time Taken: 08:00 Temperature (F): 97.8 Height (in): 67 Pulse (bpm): 66 Source: Stated Respiratory Rate (breaths/min): 20 Weight (lbs): 160 Blood Pressure (mmHg): 101/65 Source: Stated Reference Range: 80 - 120 mg / dl Body Mass Index (BMI): 25.1 Electronic Signature(s) Signed: 08/28/2022 5:21:52 PM By: Deon Pilling RN, BSN Entered By: Deon Pilling on 08/27/2022 08:03:03

## 2022-08-31 ENCOUNTER — Encounter: Payer: Self-pay | Admitting: Thoracic Surgery (Cardiothoracic Vascular Surgery)

## 2022-08-31 ENCOUNTER — Ambulatory Visit (INDEPENDENT_AMBULATORY_CARE_PROVIDER_SITE_OTHER): Payer: Self-pay | Admitting: Thoracic Surgery (Cardiothoracic Vascular Surgery)

## 2022-08-31 VITALS — BP 107/64 | HR 60 | Resp 20 | Ht 67.0 in | Wt 161.0 lb

## 2022-08-31 DIAGNOSIS — Z95828 Presence of other vascular implants and grafts: Secondary | ICD-10-CM

## 2022-08-31 DIAGNOSIS — Z951 Presence of aortocoronary bypass graft: Secondary | ICD-10-CM

## 2022-08-31 NOTE — Progress Notes (Signed)
[name] [address]             [phone]           [facility] Medical Record #[id number] Date of Birth: August 02, 1956  Joe Latch, MD Willey Blade, MD  Chief Complaint:    Chief Complaint  Patient presents with   Routine Post Op    S/p Bentall, CABG 9/27    History of Present Illness:     Pt sp bental procedure with CABx1 and placement of epicardial wires who had an extensively long post operative course complicated with CHF and need for inotropic support. Pt developed deconditioning and sacral decubitus. He has been home for several weeks and has seen wound clinic and has started change of packing to dakon's solution bid. Pt has most of his concerns about his bilateral hip discomfort (has seen ortho and dx with deconditioning) and buttock pain. No SOB. Has been ambulating well and often to keep off back side. Has abdominal cramping issues chronically now that has no nausea and has been moving his bowels regularly. Not seem to be associated with medication delivery. Has had stable weight but also with some more lower ext edema. 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   Murmur 05/26/2022   On mechanically assisted ventilation (HCC)    Osteoarthritis of left hip 01/16/2014   Osteoarthritis of right hip 05/15/2014   Primary localized osteoarthrosis of left shoulder 09/21/2017   PVC (premature ventricular contraction) 05/26/2022   Right ureteral stone    Speech impediment     Thoracic ascending aortic aneurysm ([place])    Umbilical hernia     Past Surgical History:  Procedure Laterality Date   ASCENDING AORTIC ROOT REPLACEMENT N/A 07/08/2022   Procedure: BENTALL PROCEDURE USING RESILIA KONECT 29MM CONDUIT;  Surgeon: Coralie Common, MD;  Location: [facility];  Service: Open Heart Surgery;  Laterality: N/A;   BACK SURGERY     CARDIOVERSION N/A 07/23/2022   Procedure: CARDIOVERSION;  Surgeon: Jolaine Artist, MD;  Location: [facility] ENDOSCOPY;  Service: Cardiovascular;  Laterality: N/A;   CORONARY ARTERY BYPASS GRAFT N/A 07/08/2022   Procedure: CORONARY ARTERY BYPASS GRAFTING (CABG) X 1, USING ENDOSCOPICALLY HARVESTED RIGHT LEG GREATER SAPHENOUS VEIN;  Surgeon: Coralie Common, MD;  Location: [facility];  Service: Open Heart Surgery;  Laterality: N/A;   CYSTOSCOPY W/ URETERAL STENT REMOVAL Right 04/19/2013   Procedure: CYSTOSCOPY WITH STENT REMOVAL;  Surgeon: Molli Hazard, MD;  Location: [facility];  Service: Urology;  Laterality: Right;   CYSTOSCOPY WITH RETROGRADE PYELOGRAM, URETEROSCOPY AND STENT PLACEMENT Right 04/19/2013   Procedure: CYSTOSCOPY WITH RETROGRADE PYELOGRAM, URETEROSCOPY ;  Surgeon: Molli Hazard, MD;  Location: [facility];  Service: Urology;  Laterality: Right;   CYSTOSCOPY WITH STENT PLACEMENT Right 03/24/2013   Procedure: CYSTOSCOPY WITH STENT PLACEMENT;  Surgeon:  Hanley Ben, MD;  Location: [facility];  Service: Urology;  Laterality: Right;   EPICARDIAL PACING LEAD PLACEMENT N/A 07/08/2022   Procedure: EPICARDIAL PACING LEAD PLACEMENT;  Surgeon: Coralie Common, MD;  Location: [facility];  Service: Thoracic;  Laterality: N/A;   HOLMIUM LASER APPLICATION Right 12/16/2900   Procedure: HOLMIUM LASER APPLICATION;  Surgeon: Molli Hazard, MD;  Location: [facility];  Service: Urology;  Laterality: Right;   INGUINAL HERNIA REPAIR Left 03-04-2005   POSTERIOR LUMBAR FUSION  08-27-2011   L4 -- L5   RIGHT HEART CATH  AND CORONARY ANGIOGRAPHY N/A 06/29/2022   Procedure: RIGHT HEART CATH AND CORONARY ANGIOGRAPHY;  Surgeon: Sherren Mocha, MD;  Location: [facility];  Service: Cardiovascular;  Laterality: N/A;   RIGHT SHOULDER HEMIARTHROPLASTY  08-26-2010   OA   SHOULDER HEMI-ARTHROPLASTY Left 09/21/2017   Procedure: SHOULDER HEMI-ARTHROPLASTY;  Surgeon: Marchia Bond, MD;  Location: [facility];  Service: Orthopedics;  Laterality: Left;   TEE WITHOUT CARDIOVERSION N/A 07/08/2022   Procedure: TRANSESOPHAGEAL ECHOCARDIOGRAM (TEE);  Surgeon: Coralie Common, MD;  Location: [facility];  Service: Open Heart Surgery;  Laterality: N/A;   TEE WITHOUT CARDIOVERSION N/A 07/23/2022   Procedure: TRANSESOPHAGEAL ECHOCARDIOGRAM (TEE);  Surgeon: Jolaine Artist, MD;  Location: [facility];  Service: Cardiovascular;  Laterality: N/A;   TONSILLECTOMY  AS CHILD   TOTAL HIP ARTHROPLASTY Left 01/16/2014   Procedure: TOTAL HIP ARTHROPLASTY;  Surgeon: Johnny Bridge, MD;  Location: [facility];  Service: Orthopedics;  Laterality: Left;   TOTAL HIP ARTHROPLASTY Right 05/15/2014   Procedure: RIGHT TOTAL HIP ARTHROPLASTY;  Surgeon: Johnny Bridge, MD;  Location: [facility];  Service: Orthopedics;  Laterality: Right;   TOTAL SHOULDER ARTHROPLASTY Right 2011    Social History   Tobacco Use  Smoking Status Never  Smokeless Tobacco Never    Social History   Substance and Sexual Activity  Alcohol Use No    Social History   Socioeconomic History   Marital status: Single    Spouse name: Not on file   Number of children: Not on file   Years of education: Not on file   Highest education level: Not on file  Occupational History   Occupation: unemployed  Tobacco Use   Smoking status: Never   Smokeless tobacco: Never  Vaping Use   Vaping Use: Never used  Substance and Sexual Activity   Alcohol use: No   Drug use: No   Sexual activity: Not Currently  Other Topics Concern   Not on file  Social History Narrative   Pt with delayed  mental capabilities, 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  Lives with parents.   Social Determinants of Health   Financial Resource Strain: Low Risk  (10/11/2019)   Overall Financial Resource Strain (CARDIA)    Difficulty of Paying Living Expenses: Not hard at all  Food Insecurity: No Food Insecurity (07/19/2022)   Hunger Vital Sign    Worried About Running Out of Food in the Last Year: Never true    Ran Out of Food in the Last Year: Never true  Transportation Needs: No Transportation Needs (07/19/2022)   PRAPARE - Hydrologist (Medical): No    Lack of Transportation (Non-Medical): No  Physical Activity: Sufficiently Active (10/11/2019)   Exercise Vital Sign    Days of Exercise per Week: 5 days  Minutes of Exercise per Session: 40 min  Stress: Stress Concern Present (10/11/2019)   [place]    Feeling of Stress : To some extent  Social Connections: Not on file  Intimate Partner Violence: Not At Risk (07/19/2022)   Humiliation, Afraid, Rape, and Kick questionnaire    Fear of Current or Ex-Partner: No    Emotionally Abused: No    Physically Abused: No    Sexually Abused: No    Allergies  Allergen Reactions   Codeine Nausea Only   Levaquin [Levofloxacin In D5w] Diarrhea    Current Outpatient Medications  Medication Sig Dispense Refill   acetaminophen (TYLENOL) 325 MG tablet Take 650 mg by mouth every 6 (six) hours as needed for mild pain.     amiodarone (PACERONE) 200 MG tablet Take 1 tablet (200 mg total) by mouth daily. 30 tablet 1   apixaban (ELIQUIS) 5 MG TABS tablet Take 1 tablet (5 mg total) by mouth 2 (two) times daily. 60 tablet 1   aspirin EC 81 MG tablet Take 1 tablet (81 mg total) by mouth daily. Swallow whole. 30 tablet 12   atorvastatin (LIPITOR) 80 MG tablet Take 1 tablet (80  mg total) by mouth daily. 30 tablet 1   Cholecalciferol (VITAMIN D3) 250 MCG (10000 UT) capsule Take 10,000 Units by mouth daily.     Coenzyme Q10 (CO Q-10) 100 MG CAPS Take 100 mg by mouth daily.     fluticasone (FLONASE) 50 MCG/ACT nasal spray Place 2 sprays into both nostrils daily. (Patient taking differently: Place 2 sprays into both nostrils daily as needed for allergies.) 16 g 1   furosemide (LASIX) 20 MG tablet Take 2 tablets (40 mg total) by mouth as needed for edema (Take 2 tabs as needed for weight gain of 3lbs in 24 hours or 5 lbs in 48 hours). 60 tablet 1   leptospermum manuka honey (MEDIHONEY) PSTE paste Apply 1 Application topically daily. 15 mL 1   OVER THE COUNTER MEDICATION PRUNE JUICE/FLAXSEED     potassium chloride SA (KLOR-CON M) 20 MEQ tablet Take 1 tablet (20 mEq total) by mouth as needed (Take one tab on the days you require lasix). 30 tablet 1   sacubitril-valsartan (ENTRESTO) 49-51 MG Take 1 tablet by mouth 2 (two) times daily. 60 tablet 1   spironolactone (ALDACTONE) 25 MG tablet Take 0.5 tablets (12.5 mg total) by mouth daily. 15 tablet 5   tamsulosin (FLOMAX) 0.4 MG CAPS capsule Take 0.4 mg by mouth 2 (two) times daily.     traMADol (ULTRAM) 50 MG tablet Take 1 tablet (50 mg total) by mouth every 6 (six) hours as needed for moderate pain. 28 tablet 0   No current facility-administered medications for this visit.     Family History  Problem Relation Age of Onset   Heart failure Mother    Atrial fibrillation Mother    Heart failure Maternal Grandmother    Atrial fibrillation Maternal Grandmother        Physical Exam: BP 107/64 (BP Location: Right Arm, Patient Position: Sitting, Cuff Size: Normal)   Pulse 60   Resp 20   Ht '5\' 7"'$  (1.702 m)   Wt 73 kg   SpO2 94% Comment: RA  BMI 25.22 kg/m      Diagnostic Studies & Laboratory data: I have personally reviewed the following studies and agree with the findings     Recent Radiology Findings:   No  results found.  Recent Lab Findings: Lab Results  Component Value Date   WBC 6.4 08/11/2022   HGB 10.3 (L) 08/11/2022   HCT 31.6 (L) 08/11/2022   PLT 259 08/11/2022   GLUCOSE 98 08/13/2022   CHOL 185 06/30/2022   TRIG 52 06/30/2022   HDL 50 06/30/2022   LDLCALC 125 (H) 06/30/2022   ALT 25 07/15/2022   AST 32 07/15/2022   NA 138 08/13/2022   K 4.3 08/13/2022   CL 107 08/13/2022   CREATININE 0.66 08/13/2022   BUN 19 08/13/2022   CO2 25 08/13/2022   TSH 1.208 12/12/2012   INR 1.5 (H) 07/22/2022   HGBA1C 5.3 07/07/2022   EXAM Lungs: overall clear. No crackles Cardiac: wound well healed. No murmurs Abd: soft overall nontender Ext: 1+ edema    Assessment / Plan:    SP Bental/CABG/Epicardial pacing wires CHF: as per heart failure team seeing this Friday. I have asked him to take his lasix daily till he sees Cardiology. He will continue amiodarone however mother feels may be the cause of abd cramping. Concerned he should remain on it due to need to keep in NSR. Sacral Decubiti: seeing wound clinic. Have told mother that if no improvement when sees clinic to consider changing to NS wet to dry vs enzymatic debridment dressings and she will let us know its progress Epicardial wires: awaiting sacral wound closure prior to moving forward with CRRT  Will follow up prn with [place]    I have spent 60 min in review of the records, viewing studies and in face to face with patient and in coordination of future care    Coralie Common 08/31/2022 12:31 PM

## 2022-08-31 NOTE — Patient Instructions (Signed)
Call if further deterioration in sacral wound Keep taking lasix and potassium daily Discuss amiodarone discontinuing with AHF team

## 2022-09-02 ENCOUNTER — Telehealth (HOSPITAL_COMMUNITY): Payer: Self-pay

## 2022-09-02 ENCOUNTER — Other Ambulatory Visit (HOSPITAL_COMMUNITY): Payer: Self-pay

## 2022-09-02 ENCOUNTER — Other Ambulatory Visit (HOSPITAL_BASED_OUTPATIENT_CLINIC_OR_DEPARTMENT_OTHER): Payer: Self-pay

## 2022-09-02 ENCOUNTER — Encounter (HOSPITAL_COMMUNITY): Payer: Self-pay

## 2022-09-02 ENCOUNTER — Ambulatory Visit (HOSPITAL_COMMUNITY)
Admit: 2022-09-02 | Discharge: 2022-09-02 | Disposition: A | Discharge status: Home / Self Care | Payer: Medicare Other | Attending: Cardiology | Admitting: Cardiology

## 2022-09-02 VITALS — BP 92/64 | HR 63 | Wt 164.4 lb

## 2022-09-02 DIAGNOSIS — Z953 Presence of xenogenic heart valve: Secondary | ICD-10-CM | POA: Diagnosis not present

## 2022-09-02 DIAGNOSIS — I428 Other cardiomyopathies: Secondary | ICD-10-CM | POA: Diagnosis not present

## 2022-09-02 DIAGNOSIS — I5022 Chronic systolic (congestive) heart failure: Secondary | ICD-10-CM | POA: Diagnosis present

## 2022-09-02 DIAGNOSIS — Z79899 Other long term (current) drug therapy: Secondary | ICD-10-CM | POA: Diagnosis not present

## 2022-09-02 DIAGNOSIS — Z951 Presence of aortocoronary bypass graft: Secondary | ICD-10-CM | POA: Diagnosis not present

## 2022-09-02 DIAGNOSIS — I110 Hypertensive heart disease with heart failure: Secondary | ICD-10-CM | POA: Diagnosis present

## 2022-09-02 DIAGNOSIS — I7121 Aneurysm of the ascending aorta, without rupture: Secondary | ICD-10-CM | POA: Insufficient documentation

## 2022-09-02 DIAGNOSIS — I447 Left bundle-branch block, unspecified: Secondary | ICD-10-CM | POA: Insufficient documentation

## 2022-09-02 DIAGNOSIS — T82868A Thrombosis of vascular prosthetic devices, implants and grafts, initial encounter: Secondary | ICD-10-CM | POA: Insufficient documentation

## 2022-09-02 DIAGNOSIS — Z7901 Long term (current) use of anticoagulants: Secondary | ICD-10-CM | POA: Diagnosis not present

## 2022-09-02 DIAGNOSIS — Z87440 Personal history of urinary (tract) infections: Secondary | ICD-10-CM | POA: Insufficient documentation

## 2022-09-02 DIAGNOSIS — I5042 Chronic combined systolic (congestive) and diastolic (congestive) heart failure: Secondary | ICD-10-CM

## 2022-09-02 DIAGNOSIS — I2510 Atherosclerotic heart disease of native coronary artery without angina pectoris: Secondary | ICD-10-CM | POA: Insufficient documentation

## 2022-09-02 LAB — MAGNESIUM: Magnesium: 2.2 mg/dL (ref 1.7–2.4)

## 2022-09-02 LAB — COMPREHENSIVE METABOLIC PANEL
ALT: 21 U/L (ref 0–44)
AST: 19 U/L (ref 15–41)
Albumin: 2.6 g/dL — ABNORMAL LOW (ref 3.5–5.0)
Alkaline Phosphatase: 71 U/L (ref 38–126)
Anion gap: 8 (ref 5–15)
BUN: 21 mg/dL (ref 8–23)
CO2: 24 mmol/L (ref 22–32)
Calcium: 8.7 mg/dL — ABNORMAL LOW (ref 8.9–10.3)
Chloride: 106 mmol/L (ref 98–111)
Creatinine, Ser: 0.78 mg/dL (ref 0.61–1.24)
GFR, Estimated: >60 mL/min (ref >60)
Glucose, Bld: 103 mg/dL — ABNORMAL HIGH (ref 70–99)
Potassium: 4.3 mmol/L (ref 3.5–5.1)
Sodium: 138 mmol/L (ref 135–145)
Total Bilirubin: 0.3 mg/dL (ref 0.3–1.2)
Total Protein: 5.7 g/dL — ABNORMAL LOW (ref 6.5–8.1)

## 2022-09-02 LAB — CBC
HCT: 32.8 % — ABNORMAL LOW (ref 39.0–52.0)
Hemoglobin: 10.9 g/dL — ABNORMAL LOW (ref 13.0–17.0)
MCH: 31.7 pg (ref 26.0–34.0)
MCHC: 33.2 g/dL (ref 30.0–36.0)
MCV: 95.3 fL (ref 80.0–100.0)
Platelets: 323 10*3/uL (ref 150–400)
RBC: 3.44 MIL/uL — ABNORMAL LOW (ref 4.22–5.81)
RDW: 16.7 % — ABNORMAL HIGH (ref 11.5–15.5)
WBC: 7.5 10*3/uL (ref 4.0–10.5)
nRBC: 0 % (ref 0.0–0.2)

## 2022-09-02 LAB — BRAIN NATRIURETIC PEPTIDE: B Natriuretic Peptide: 473.8 pg/mL — ABNORMAL HIGH (ref 0.0–100.0)

## 2022-09-02 LAB — TSH: TSH: 4.22 u[IU]/mL (ref 0.350–4.500)

## 2022-09-02 MED ORDER — TRAMADOL HCL 50 MG PO TABS: 50.0000 mg | ORAL_TABLET | Freq: Four times a day (QID) | ORAL | 0 refills | Status: DC | PRN

## 2022-09-02 MED ORDER — SPIRONOLACTONE 25 MG PO TABS: 12.5000 mg | ORAL_TABLET | Freq: Two times a day (BID) | ORAL | 5 refills | Status: DC

## 2022-09-02 MED ORDER — FUROSEMIDE 20 MG PO TABS: 40.0000 mg | ORAL_TABLET | Freq: Every day | ORAL | 6 refills | Status: DC

## 2022-09-02 NOTE — Patient Instructions (Signed)
INCREASE Spironolactone to 12.5 mg (half tab) twice  a day  Labs today We will only contact you if something comes back abnormal or we need to make some changes. Otherwise no news is good news!  Your physician recommends that you schedule a follow-up appointment in: 2 weeks  in the Advanced Practitioners (PA/NP) Clinic    Do the following things EVERYDAY: Weigh yourself in the morning before breakfast. Write it down and keep it in a log. Take your medicines as prescribed Eat low salt foods--Limit salt (sodium) to 2000 mg per day.  Stay as active as you can everyday Limit all fluids for the day to less than 2 liters  At the [facility], you and your health needs are our priority. As part of our continuing mission to provide you with exceptional heart care, we have created designated Provider Care Teams. These Care Teams include your primary Cardiologist (physician) and Advanced Practice Providers (APPs- Physician Assistants and Nurse Practitioners) who all work together to provide you with the care you need, when you need it.   You may see any of the following providers on your designated Care Team at your next follow up: Dr Glori Bickers Dr Loralie Champagne Dr. Roxana Hires, NP Lyda Jester, [place] [facility] [place], [place] Forestine Na, NP Audry Riles, PharmD   Please be sure to bring in all your medications bottles to every appointment.   If you have any questions or concerns before your next appointment please send [place] a message through [place] or call our office at [phone].    TO LEAVE A MESSAGE FOR THE NURSE SELECT OPTION 2, PLEASE LEAVE A MESSAGE INCLUDING: YOUR NAME DATE OF BIRTH CALL BACK NUMBER REASON FOR CALL**this is important as we prioritize the call backs  YOU WILL RECEIVE A CALL BACK THE SAME DAY AS LONG AS YOU CALL BEFORE 4:00 PM

## 2022-09-02 NOTE — Telephone Encounter (Signed)
Patient aware and agreeable. Labs ordered and scheduled. 

## 2022-09-02 NOTE — Progress Notes (Signed)
Advanced Heart Failure Clinic Note   Referring Physician: PCP: Willey Blade, MD PCP-Cardiologist: Skeet Latch, MD  AHF: Dr. Haroldine Laws   Reason for Visit: [facility] : [facility] F/u for Systolic Heart Failure, s/p Bentall procedure for ascending aortic aneurysm  + CABG x 1, SVR-RCA  HPI:  66 y/o male recently found to have severe LV dysfunction in setting of markedly dilated aortic root (67 mm) and severe AI (tricuspid AoV). Echo 9/14 EF 25-30%, LV severely dilated, severe AI, Global HK, RV ok. LBBB also noted. [facility] 9/18 showed single vessel CAD w/ 60-70% pRCA stenosis, patent LM, LAD and LCx. RHC w/ low filling pressures and preserved CO, CI 2.53.    Underwent Bentall procedure + CABG x 1, SVR-RCA, on 9/26. Intraoperative TEE showed LVEF 20-25%, RV normal. Normal functioning bioprosthetic AoV w/ no AI and no perivalvular leak post repair. He had a prolonged hospital course as post op recovery was complicated by post-cardiotomy shock slow to wean off pressors, severe ileus/constipation, urinary retention/ obstruction requiring placement of foley catheter, atrial fibrillation requiring DCCV, RUE PICC Line Associated DVT, deconditioning and sacral wound. Home health arranged at discharge. Referred to outpatient wound clinic for ongoing management of sacral wound.   Of note, he was discharged home on 11/2. On 11/16 he was diagnosed w/ UTI. Started on abx.   Had EP f/u 11/19. Given persistent sacral wound, decision made to defer CRT-D implant until healed given infection risk. He has return f/u again w/ EP on 1/10 to revisit.   He presents to [facility] today for post hospital f/u for HF. Here w/ mother. She provides most of the history.   Doing ok cardiac wise. Struggling more w/ b/l hip pain R>L and pain from sacral wound. Going to wound clinic. Has h/o b/l THR. Recently seen by ortho and they did not find any acute problems. No concern for septic arthritis. Thinks hip pain possibly secondary to  inactivity/ prolonged hospital stay. Was given Toradol injection w/o improvement. He denies subjective fever and chills. Asking for Rx for pain medicine. Foley has been removed. No further issues w/ urinary retention.   Denies CP. No resting dyspnea. EKG today shows  LBBB, QRS 190 ms. No clear p-waves. HR 62 bpm. Denies dizziness. Compliant w/ Eliquis.   BP soft 92/64. Denies orthostatic symptoms, NYHA Class II-early III. B/l ankle edema on exam.      Review of Systems: [y] = yes, '[ ]'$  = no   General: Weight gain '[ ]'$ ; Weight loss '[ ]'$ ; Anorexia '[ ]'$ ; Fatigue '[ ]'$ ; Fever '[ ]'$ ; Chills '[ ]'$ ; Weakness '[ ]'$   Cardiac: Chest pain/pressure '[ ]'$ ; Resting SOB '[ ]'$ ; Exertional SOB [Y ]; Orthopnea '[ ]'$ ; Pedal Edema [Y ]; Palpitations '[ ]'$ ; Syncope '[ ]'$ ; Presyncope '[ ]'$ ; Paroxysmal nocturnal dyspnea'[ ]'$   Pulmonary: Cough '[ ]'$ ; Wheezing'[ ]'$ ; Hemoptysis'[ ]'$ ; Sputum '[ ]'$ ; Snoring '[ ]'$   GI: Vomiting'[ ]'$ ; Dysphagia'[ ]'$ ; Melena'[ ]'$ ; Hematochezia '[ ]'$ ; Heartburn'[ ]'$ ; Abdominal pain '[ ]'$ ; Constipation '[ ]'$ ; Diarrhea '[ ]'$ ; BRBPR '[ ]'$   GU: Hematuria'[ ]'$ ; Dysuria '[ ]'$ ; Nocturia'[ ]'$   Vascular: Pain in legs with walking '[ ]'$ ; Pain in feet with lying flat '[ ]'$ ; Non-healing sores [ Y]; Stroke '[ ]'$ ; TIA '[ ]'$ ; Slurred speech '[ ]'$ ;  Neuro: Headaches'[ ]'$ ; Vertigo'[ ]'$ ; Seizures'[ ]'$ ; Paresthesias'[ ]'$ ;Blurred vision '[ ]'$ ; Diplopia '[ ]'$ ; Vision changes '[ ]'$   Ortho/Skin: Arthritis [ Y]; Joint pain [Y ]; Muscle pain [ Y]; Joint swelling '[ ]'$ ; Back Pain '[ ]'$ ;  Rash '[ ]'$   Psych: Depression[ Y]; Anxiety'[ ]'$   Heme: Bleeding problems '[ ]'$ ; Clotting disorders '[ ]'$ ; Anemia '[ ]'$   Endocrine: Diabetes '[ ]'$ ; Thyroid dysfunction'[ ]'$    Past Medical History:  Diagnosis Date   Anxiety    over surgery   Arthritis    BACK AND SHOULDER   Cognitive deficits    Congenital brain damage (HCC)    Constipation    Coronary artery disease    Expressive speech delay    History of kidney stones    Hypertension    LBBB (left bundle branch block) 05/26/2022   Lower extremity edema 05/26/2022    Mental retardation    PERFORMS ADL'S WITH NO DIFFICULTY /  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     amiodarone (PACERONE) 200 MG tablet Take 1 tablet (200 mg total) by mouth daily. 30 tablet 1   apixaban (ELIQUIS) 5 MG TABS tablet Take 1 tablet (5 mg total) by mouth 2 (two) times daily. 60 tablet 1   aspirin EC 81 MG tablet Take 1 tablet (81 mg total) by mouth daily. Swallow whole. 30 tablet 12   atorvastatin (LIPITOR) 80 MG tablet Take 1 tablet (80 mg total) by mouth daily. 30 tablet 1   Cholecalciferol (VITAMIN D3) 250 MCG (10000 UT) capsule Take 10,000 Units by mouth daily.     Coenzyme Q10 (CO Q-10) 100 MG CAPS Take 100 mg by mouth daily.     fluticasone (FLONASE) 50 MCG/ACT nasal spray Place 2 sprays into both nostrils daily. (Patient taking differently: Place 2 sprays into both nostrils daily as needed for allergies.) 16 g 1   furosemide (LASIX) 20 MG tablet Take 2 tablets (40 mg total) by mouth as needed for edema (Take 2 tabs as needed for weight gain of 3lbs in 24 hours or 5 lbs in 48 hours). 60 tablet 1   leptospermum manuka honey (MEDIHONEY) PSTE paste Apply 1 Application topically daily. 15 mL 1   OVER THE COUNTER MEDICATION PRUNE JUICE/FLAXSEED     potassium chloride SA (KLOR-CON M) 20 MEQ tablet Take 1 tablet (20 mEq total) by mouth as needed (Take one tab on the days you require lasix). 30 tablet 1   sacubitril-valsartan (ENTRESTO) 49-51 MG Take 1  tablet by mouth 2 (two) times daily. 60 tablet 1   spironolactone (ALDACTONE) 25 MG tablet Take 0.5 tablets (12.5 mg total) by mouth daily. 15 tablet 5   tamsulosin (FLOMAX) 0.4 MG CAPS capsule Take 0.4 mg by mouth 2 (two) times daily.     traMADol (ULTRAM) 50 MG tablet Take 1 tablet (50 mg total) by mouth every 6 (six) hours as needed for moderate pain. 28 tablet 0   No current facility-administered medications for this encounter.    Allergies  Allergen Reactions   Codeine Nausea Only   Levaquin [Levofloxacin In D5w] Diarrhea      Social History   Socioeconomic History  Marital status: Single    Spouse name: Not on file   Number of children: Not on file   Years of education: Not on file   Highest education level: Not on file  Occupational History   Occupation: unemployed  Tobacco Use   Smoking status: Never   Smokeless tobacco: Never  Vaping Use   Vaping Use: Never used  Substance and Sexual Activity   Alcohol use: No   Drug use: No   Sexual activity: Not Currently  Other Topics Concern   Not on file  Social History Narrative   Pt with delayed mental capabilities, 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  Lives with parents.   Social Determinants of Health   Financial Resource Strain: Low Risk  (10/11/2019)   Overall Financial Resource Strain (CARDIA)    Difficulty of Paying Living Expenses: Not hard at all  Food Insecurity: No Food Insecurity (07/19/2022)   Hunger Vital Sign    Worried About Running Out of Food in the Last Year: Never true    Ran Out of Food in the Last Year: Never true  Transportation Needs: No Transportation Needs (07/19/2022)   PRAPARE - Hydrologist (Medical): No    Lack of Transportation (Non-Medical): No  Physical Activity: Sufficiently Active (10/11/2019)   Exercise Vital Sign    Days of Exercise per Week: 5 days     Minutes of Exercise per Session: 40 min  Stress: Stress Concern Present (10/11/2019)   [employer]    Feeling of Stress : To some extent  Social Connections: Not on file  Intimate Partner Violence: Not At Risk (07/19/2022)   Humiliation, Afraid, Rape, and Kick questionnaire    Fear of Current or Ex-Partner: No    Emotionally Abused: No    Physically Abused: No    Sexually Abused: No      Family History  Problem Relation Age of Onset   Heart failure Mother    Atrial fibrillation Mother    Heart failure Maternal Grandmother    Atrial fibrillation Maternal Grandmother     Vitals:   09/02/22 0933  BP: 92/64  Pulse: 63  SpO2: 98%  Weight: 74.6 kg (164 lb 6.4 oz)     PHYSICAL EXAM: General:  fatigued appearing. No respiratory difficulty, kyphotic posture  HEENT: normal Neck: supple. no JVD. Carotids 2+ bilat; no bruits. No lymphadenopathy or thyromegaly appreciated. Cor: PMI nondisplaced. Regular rate & rhythm. No rubs, gallops or murmurs. Lungs: clear Abdomen: soft, nontender, nondistended. No hepatosplenomegaly. No bruits or masses. Good bowel sounds. Extremities: no cyanosis, clubbing, rash, 1+ b/l ankle edema Neuro: alert & oriented x 3, cranial nerves grossly intact. moves all 4 extremities w/o difficulty. Affect pleasant.  ECG: LBBB 190 ms, appears regular but no clear p waves, 62 bpm. QTc 527 ms    ASSESSMENT & PLAN:  1. Chronic Systolic Heart Failure  - newly diagnosed; Echo 9/23 EF 25-30%, LV severely dilated, severe AI, Global HK, RV ok  - NICM, severity out of proportion to degree of CAD  - likely valvular CM in setting of severe AI +/- LBBB CM  - now post op bioprosthetic AVR + CABG 9/23. Intraoperative TEE LVEF 20-25%, RV ok - Echo 10/9- EF 25% RV function improving. Moderate to severe MR (in setting of LBBB) AVR ok   -  Echo 10/23-  EF 20% RV moderately reduced. Moderate to severe PI. AVR ok  -  NYHA Class II-early III. Mild fluid overload on exam. Soft BP limits aggressive med titration  - Increase Spiro to 12.5 mg bid - Continue Entresto 49-51 mg twice a day   - Continue Lasix 20 mg daily  - With junctional rhythm, will hold off on beta blocker and digoxin for now to avoid worsening A-V conduction.  - would avoid SGLT2i given h/o urinary retention and recent UTI  - Instructed to use TED hoses to help w/ LEE and reduce risk of orthostasis  - w/ LBBB, will need eventual CRT-D (see below)   2. Ascending Aortic Aneurysm/ Severe AI  - CTA  w/ severe dilation at the sinus of Valsalva, 67 mm. Moderate dilation of mid ascending aorta, 44 mm. Tricuspid AoV - s/p Bentall with bioprosthetic aortic valve 9/27 - intraoperative TEE, no perivalvular leak post repair  - Echo 10/10 EF 25% RV function improving. Moderate to severe MR (in setting of LBBB) AVR ok   - Echo 10/23 EF 20% RV moderately reduced. Moderate to severe PI. AVR ok  - seen by Dr. Lavonna Monarch for post-hospital 11/20. Doing ok from surgical standpoint   3. CAD - single vessel, 60-70% pRCA  - s/p CABG x 1, SVG- RCA  - ASA + statin  - No BB for now w/ bradycardia    4. LBBB - QRS 190 ms on today's EKG  - Will eventually need epicardial lead hooked up for CRT once sacral wound heals  - Has f/u w/ EP 1/10   5. Atrial fib/flutter, post-op - required DCCV during hospital stay>>NSR - unable to see p waves on today's EKG, though appears regular. RRR on ascultation. HR 60s  - Continue Amiodarone 200 mg daily. Check CMP and TSH today  - QTc 527 ms. Check CMP and Mg today  - Eliquis 5 mg bid. Check CBC   6. Mitral Regurgitation  - mod-severe on echo 10/23, in setting of LBBB - planning HF optimization w/ CRT-D as outlined above    7. RUE PICC Line Associated DVT  - complication recent hospitalization  - continue Eliquis   8. Sacral Wound - followed by wound clinic   9. Severe Hip Pain, R>L  - h/o b/l THR - recently seen  recently by ortho, pain felt 2/2 inactivity. Had no improvement after Toradol injection  - he does not look septic on exam but given hip prosthesis and proximity of sacral wound, will check CBC  - Rx given for tramadol 50 mg q6H PRN. #30 tab. 0 refills. Advised to f/u w/ PCP for additional pain management   F/u w/ APP in 2 wks to reassess volume status and BP     Lyda Jester, PA-C 09/02/22

## 2022-09-08 ENCOUNTER — Other Ambulatory Visit (HOSPITAL_BASED_OUTPATIENT_CLINIC_OR_DEPARTMENT_OTHER): Payer: Self-pay

## 2022-09-10 ENCOUNTER — Encounter (HOSPITAL_BASED_OUTPATIENT_CLINIC_OR_DEPARTMENT_OTHER): Payer: Medicare Other | Admitting: Internal Medicine

## 2022-09-10 DIAGNOSIS — Z951 Presence of aortocoronary bypass graft: Secondary | ICD-10-CM

## 2022-09-10 DIAGNOSIS — L89154 Pressure ulcer of sacral region, stage 4: Secondary | ICD-10-CM

## 2022-09-10 DIAGNOSIS — I5042 Chronic combined systolic (congestive) and diastolic (congestive) heart failure: Secondary | ICD-10-CM

## 2022-09-10 NOTE — Progress Notes (Signed)
Joe [name] ([id number]) [id number] Page 1 of 7 Visit Report for 09/10/2022 Chief Complaint Document Details Patient Name: Date of Service: Joe [name], [place] Joe [name]. 09/10/2022 8:00 [name] Medical Record Number: [id number] Patient Account Number: [id number] Date of Birth/Sex: Treating RN: 05/29/1956 (66 y.o. M) Primary Care Provider: Salena [name] Other Clinician: Referring Provider: Treating Provider/Extender: Joe [name] in Treatment: 2 Information Obtained from: Patient Chief Complaint 08/27/2022; patient presents for sacral wound Electronic Signature(s) Signed: 09/10/2022 9:11:53 AM By: Joe Shan DO Entered By: Joe [name] on 09/10/2022 08:43:30 -------------------------------------------------------------------------------- HPI Details Patient Name: Date of Service: Joe [name], [place] Joe [name]. 09/10/2022 8:00 [name] Medical Record Number: [id number] Patient Account Number: [id number] Date of Birth/Sex: Treating RN: August 10, 1956 (66 y.o. M) Primary Care Provider: Salena [name] Other Clinician: Referring Provider: Treating Provider/Extender: Joe [name] in Treatment: 2 History of Present Illness HPI Description: 08/27/2022 Joe [name] is [name] 66 year old male with [name] past medical history of chronic combined systolic diastolic heart failure, coronary artery disease status post CABG x1, left bundle branch block That presents to the clinic for [name] sacral ulcer. Patient was admitted to the hospital on 07/08/2022 For 6 weeks and discharged on 08/13/2022 For placement of [name] bioprosthetic aortic valve due to an ascending aortic aneurysm. During this admission patient states that he developed the sacral ulcer. He is not bedbound and ambulates with [name] walker. He is currently living with his mother and home health comes out for dressing changes. Currently he is using Medihoney every 3 days.  There is mild odor. Currently denies systemic signs of infection. 11/30; patient presents for follow-up. He has been using Dakin's wet-to-dry dressings mother is present during the encounter. He has no issues or complaints today. He denies signs of infection. Electronic Signature(s) Signed: 09/10/2022 9:11:53 AM By: Joe Shan DO Entered By: Joe [name] on 09/10/2022 08:44:15 -------------------------------------------------------------------------------- Physical Exam Details Patient Name: Date of Service: Joe [name], [place] [name]. 09/10/2022 8:00 [name] Medical Record Number: [id number] Patient Account Number: [id number] Joe [name], Joe [name] [name] ([id number]) [id number].pdf Page 2 of 7 Date of Birth/Sex: Treating RN: 1955/11/03 (66 y.o. M) Primary Care Provider: Other Clinician: Salena [name] Referring Provider: Treating Provider/Extender: Joe [name] in Treatment: 2 Constitutional respirations regular, non-labored and within target range for patient.[name] Kitchen Psychiatric pleasant and cooperative. Notes Sacral ulcer: Open wound with granulation tissue at the opening. This does undermine significantly for about 5 cm from the 10 to 1 o'clock position. No probing to bone. No signs of overt infection. Electronic Signature(s) Signed: 09/10/2022 9:11:53 AM By: Joe Shan DO Entered By: Joe [name] on 09/10/2022 08:45:18 -------------------------------------------------------------------------------- Physician Orders Details Patient Name: Date of Service: Joe [name], Joe [name]. 09/10/2022 8:00 [name] Medical Record Number: [id number] Patient Account Number: [id number] Date of Birth/Sex: Treating RN: 1956-01-29 (66 y.o. Joe [name] Primary Care Provider: Salena [name] Other Clinician: Referring Provider: Treating Provider/Extender: Joe [name] in Treatment: 2 Verbal / Phone Orders:  No Diagnosis Coding Follow-up Appointments ppointment in 2 weeks. - w/ Joe [name] Return [name] Anesthetic (In clinic) Topical Lidocaine 5% applied to wound bed Bathing/ Shower/ Hygiene May shower with protection but do not get wound dressing(s) wet. Off-Loading Turn and reposition every 2 hours [place] wound care orders this week; continue Home Health for wound care. May utilize formulary equivalent dressing for wound treatment orders unless otherwise specified. - Advance to change 3x  [name] week. Pt.'s mother will change all other times. Wound Treatment Wound #1 - Sacrum Cleanser: Soap and Water ([place]) 2 x Per WUJ/81 Days Discharge Instructions: May shower and wash wound with dial antibacterial soap and water prior to dressing change. Cleanser: Wound Cleanser (Home Health) 2 x Per Day/15 Days Discharge Instructions: Cleanse the wound with wound cleanser prior to applying [name] clean dressing using gauze sponges, not tissue or cotton balls. Peri-Wound Care: Skin Prep (Home Health) 2 x Per Day/15 Days Discharge Instructions: Use skin prep as directed Prim Dressing: Dakin's Solution 0.25%, 16 (oz) (Generic) 2 x Per Day/15 Days ary Discharge Instructions: Moisten gauze with Dakin's solution Secondary Dressing: ABD Pad, 5x9 (Generic) 2 x Per Day/15 Days Discharge Instructions: Apply over primary dressing as directed. Secured With: 41M Medipore H Soft Cloth Surgical T ape, 4 x 10 (in/yd) (Generic) 2 x Per Day/15 Days Discharge Instructions: Secure with tape as directed. Joe [name], Joe [name] ([id number]) [id number] Page 3 of 7 Electronic Signature(s) Signed: 09/10/2022 9:11:53 AM By: Joe Shan DO Entered By: Joe [name] on 09/10/2022 08:45:28 -------------------------------------------------------------------------------- Problem List Details Patient Name: Date of Service: Joe Herald, Joe Joe [name]. 09/10/2022 8:00 [name] Medical Record Number:  [id number] Patient Account Number: [id number] Date of Birth/Sex: Treating RN: 02-Dec-1955 (66 y.o. M) Primary Care Provider: Salena [name] Other Clinician: Referring Provider: Treating Provider/Extender: Joe [name] in Treatment: 2 Active Problems ICD-10 Encounter Code Description Active Date MDM Diagnosis L89.154 Pressure ulcer of sacral region, stage 4 08/27/2022 No Yes I50.42 Chronic combined systolic (congestive) and diastolic (congestive) heart failure 08/27/2022 No Yes Z95.1 Presence of aortocoronary bypass graft 08/27/2022 No Yes Inactive Problems Resolved Problems Electronic Signature(s) Signed: 09/10/2022 9:11:53 AM By: Joe Shan DO Entered By: Joe [name] on 09/10/2022 08:43:17 -------------------------------------------------------------------------------- Progress Note Details Patient Name: Date of Service: Joe [name], Joe [name]. 09/10/2022 8:00 [name] Medical Record Number: [id number] Patient Account Number: [id number] Date of Birth/Sex: Treating RN: [birth year] (66 y.o. M) Primary Care Provider: Salena [name] Other Clinician: Referring Provider: Treating Provider/Extender: Joe [name] in Treatment: 2 Subjective Chief Complaint Information obtained from Patient 08/27/2022; patient presents for sacral wound History of Present Illness (HPI) Joe [name], Joe [name] ([id number]) [phone].pdf Page 4 of 7 08/27/2022 Mr. Isaish Alemu is [name] 66 year old male with [name] past medical history of chronic combined systolic diastolic heart failure, coronary artery disease status post CABG x1, left bundle branch block That presents to the clinic for [name] sacral ulcer. Patient was admitted to the hospital on 07/08/2022 For 6 weeks and discharged on 08/13/2022 For placement of [name] bioprosthetic aortic valve due to an ascending aortic aneurysm. During this admission patient states that  he developed the sacral ulcer. He is not bedbound and ambulates with [name] walker. He is currently living with his mother and home health comes out for dressing changes. Currently he is using Medihoney every 3 days. There is mild odor. Currently denies systemic signs of infection. 11/30; patient presents for follow-up. He has been using Dakin's wet-to-dry dressings mother is present during the encounter. He has no issues or complaints today. He denies signs of infection. Patient History Family History Heart Disease - Mother,Maternal Grandparents. Social History Never smoker, Alcohol Use - Never, Drug Use - No History, Caffeine Use - Never. Medical History Cardiovascular Patient has history of Coronary Artery Disease, Hypertension Musculoskeletal Patient has history of Osteoarthritis - bilateral hips; left shoulder Hospitalization/Surgery History - cardioversions 07/2022. - CABG 07/08/2022. -  hip replacements 2015. - 2018 left shoulder surgery. - epicardial pacing lead placement 07/08/2022. Medical [name] Surgical History Notes nd Constitutional Symptoms (General Health) cognitive deficits congenital brain damage expressive speech delay Cardiovascular PVC LBBB thoracic ascending aortic aneurysm epicardial pacing lead placement Gastrointestinal umbilical hernia Genitourinary kidney stones Objective Constitutional respirations regular, non-labored and within target range for patient.. Vitals Time Taken: 8:06 AM, Height: 67 in, Weight: 160 lbs, BMI: 25.1, Temperature: 98.7 F, Pulse: 74 bpm, Respiratory Rate: 17 breaths/min, Blood Pressure: 87/56 mmHg. Psychiatric pleasant and cooperative. General Notes: Sacral ulcer: Open wound with granulation tissue at the opening. This does undermine significantly for about 5 cm from the 10 to 1 o'clock position. No probing to bone. No signs of overt infection. Integumentary (Hair, Skin) Wound #1 status is Open. Original cause of wound was Pressure Injury.  The date acquired was: 08/07/2022. The wound has been in treatment 2 weeks. The wound is located on the Sacrum. The wound measures 4cm length x 3.2cm width x 1cm depth; 10.053cm^2 area and 10.053cm^3 volume. There is muscle and Fat Layer (Subcutaneous Tissue) exposed. There is no tunneling noted, however, there is undermining starting at 10:00 and ending at 2:00 with [name] maximum distance of 5cm. There is [name] large amount of serosanguineous drainage noted. Foul odor after cleansing was noted. The wound margin is distinct with the outline attached to the wound base. There is small (1-33%) red, pink granulation within the wound bed. There is [name] large (67-100%) amount of necrotic tissue within the wound bed including Adherent Slough and Necrosis of Muscle. The periwound skin appearance did not exhibit: Callus, Crepitus, Excoriation, Induration, Rash, Scarring, Dry/Scaly, Maceration, Atrophie Blanche, Cyanosis, Ecchymosis, Hemosiderin Staining, Mottled, Pallor, Rubor, Erythema. The periwound has tenderness on palpation. Assessment Active Problems ICD-10 Pressure ulcer of sacral region, stage 4 Chronic combined systolic (congestive) and diastolic (congestive) heart failure Presence of aortocoronary bypass graft Joe [name], Joe [name] ([id number]) [id number] Page 5 of 7 Patient's wound has improved and size in appearance since last clinic visit. I recommended continuing Dakin's wet-to-dry dressings and aggressive offloading. Follow-up in 2 weeks Plan Follow-up Appointments: Return Appointment in 2 weeks. - w/ Dr. Heber [place] Anesthetic: (In clinic) Topical Lidocaine 5% applied to wound bed Bathing/ Shower/ Hygiene: May shower with protection but do not get wound dressing(s) wet. Off-Loading: Turn and reposition every 2 hours Home Health: New wound care orders this week; continue Home Health for wound care. May utilize formulary equivalent dressing for wound treatment orders  unless otherwise specified. - Advance to change 3x [name] week. Pt.'s mother will change all other times. WOUND #1: - Sacrum Wound Laterality: Cleanser: Soap and Water ([place]) 2 x Per Day/15 Days Discharge Instructions: May shower and wash wound with dial antibacterial soap and water prior to dressing change. Cleanser: Wound Cleanser (Home Health) 2 x Per Day/15 Days Discharge Instructions: Cleanse the wound with wound cleanser prior to applying [name] clean dressing using gauze sponges, not tissue or cotton balls. Peri-Wound Care: Skin Prep (Home Health) 2 x Per Day/15 Days Discharge Instructions: Use skin prep as directed Prim Dressing: Dakin's Solution 0.25%, 16 (oz) (Generic) 2 x Per Day/15 Days ary Discharge Instructions: Moisten gauze with Dakin's solution Secondary Dressing: ABD Pad, 5x9 (Generic) 2 x Per Day/15 Days Discharge Instructions: Apply over primary dressing as directed. Secured With: 13M Medipore H Soft Cloth Surgical T ape, 4 x 10 (in/yd) (Generic) 2 x Per Day/15 Days Discharge Instructions: Secure with tape as directed. 1. Dakin's wet-to-dry dressings 2. Aggressive  offloading 3. Follow-up in 2 weeks Electronic Signature(s) Signed: 09/10/2022 9:11:53 AM By: Joe Shan DO Entered By: Joe [name] on 09/10/2022 08:46:10 -------------------------------------------------------------------------------- HxROS Details Patient Name: Date of Service: Joe Herald, Joe Joe [name]. 09/10/2022 8:00 [name] Medical Record Number: [id number] Patient Account Number: [id number] Date of Birth/Sex: Treating RN: May 17, 1956 (67 y.o. M) Primary Care Provider: Salena [name] Other Clinician: Referring Provider: Treating Provider/Extender: Joe [name] in Treatment: 2 Constitutional Symptoms (General Health) Medical History: Past Medical History Notes: cognitive deficits congenital brain damage expressive speech delay Cardiovascular Medical  History: Positive for: Coronary Artery Disease; Hypertension Past Medical History Notes: PVC LBBB thoracic ascending aortic aneurysm epicardial pacing lead placement Gastrointestinal Medical History: Past Medical History Notes: umbilical hernia Joe [name], Joe [name] ([id number]) [id number] Page 6 of 7 Genitourinary Medical History: Past Medical History Notes: kidney stones Musculoskeletal Medical History: Positive for: Osteoarthritis - bilateral hips; left shoulder Immunizations Pneumococcal Vaccine: Received Pneumococcal Vaccination: No Implantable Devices No devices added Hospitalization / Surgery History Type of Hospitalization/Surgery cardioversions 07/2022 CABG 07/08/2022 hip replacements 2015 2018 left shoulder surgery epicardial pacing lead placement 07/08/2022 Family and Social History Heart Disease: Yes - Mother,Maternal Grandparents; Never smoker; Alcohol Use: Never; Drug Use: No History; Caffeine Use: Never; Financial Concerns: No; Food, Clothing or Shelter Needs: No; Support System Lacking: No; Transportation Concerns: No Electronic Signature(s) Signed: 09/10/2022 9:11:53 AM By: Joe Shan DO Entered By: Joe [name] on 09/10/2022 08:44:26 -------------------------------------------------------------------------------- SuperBill Details Patient Name: Date of Service: Joe Herald, [place] [name]. 09/10/2022 Medical Record Number: [id number] Patient Account Number: [id number] Date of Birth/Sex: Treating RN: 1956/06/17 (66 y.o. Joe [name], Joe [name] Primary Care Provider: Salena [name] Other Clinician: Referring Provider: Treating Provider/Extender: Joe [name] in Treatment: 2 Diagnosis Coding ICD-10 Codes Code Description [phone] Pressure ulcer of sacral region, stage 4 I50.42 Chronic combined systolic (congestive) and diastolic (congestive) heart failure Z95.1 Presence of aortocoronary bypass  graft Facility Procedures : CPT4 Code: [id number] Description: 99213 - WOUND CARE VISIT-LEV 3 EST PT Modifier: Quantity: 1 Physician Procedures : CPT4 Code Description Modifier [id number] 19758 - WC PHYS LEVEL 3 - EST PT ICD-10 Diagnosis Description L89.154 Pressure ulcer of sacral region, stage 4 I50.42 Chronic combined systolic (congestive) and diastolic (congestive) heart failure Joe [name], Joe [name] ([id number]) [id number] [place] Z95.1 Presence of aortocoronary bypass graft Quantity: 1 ge 7 of 7 Electronic Signature(s) Signed: 09/10/2022 9:11:53 AM By: Joe Shan DO Entered By: Joe [name] on 09/10/2022 08:46:41

## 2022-09-11 NOTE — Progress Notes (Signed)
Avitabile, Daily Joe [phone] [id number] Page 1 of 9 Visit Report for 09/10/2022 Arrival Information Details Patient Name: Date of Service: Joe Joe, [place] RK Joe. 09/10/2022 8:00 Joe Joe Medical Record Number: [id number] Patient Account Number: [id number] Date of Birth/Sex: Treating RN: [birth year] (66 y.o. Joe Joe, Joe Joe Primary Care Joe Joe: Joe Joe Other Clinician: Referring Cardell Rachel: Treating Joe Joe/Extender: Joe Joe in Treatment: 2 Visit Information History Since Last Visit Added or deleted any medications: No Patient Arrived: Ambulatory Any new allergies or adverse reactions: No Arrival Time: 08:04 Had Joe fall or experienced change in No Accompanied By: mom activities of daily living that may affect Transfer Assistance: Manual risk of falls: Patient Identification Verified: Yes Signs or symptoms of abuse/neglect since last visito No Secondary Verification Process Completed: Yes Hospitalized since last visit: No Patient Requires Transmission-Based Precautions: No Implantable device outside of the clinic excluding No Patient Has Alerts: Yes cellular tissue based products placed in the center Patient Alerts: Patient on Blood Thinner since last visit: Has Dressing in Place as Prescribed: Yes Pain Present Now: No Electronic Signature(s) Signed: 09/10/2022 5:26:06 PM By: Joe Hammock RN Entered By: Joe Joe on 09/10/2022 08:06:23 -------------------------------------------------------------------------------- Clinic Level of Care Assessment Details Patient Name: Date of Service: Joe TI, [place] RK Joe. 09/10/2022 8:00 Joe Joe Medical Record Number: [id number] Patient Account Number: [id number] Date of Birth/Sex: Treating RN: [birth year] (66 y.o. Joe Joe, Joe Joe Primary Care Danyael Alipio: Joe Joe Other Clinician: Referring Joe Joe: Treating Joe Joe/Extender: Joe Joe in Treatment: 2 Clinic Level of Care Assessment Items TOOL 4 Quantity Score X- 1 0 Use when only an EandM is performed on FOLLOW-UP visit ASSESSMENTS - Nursing Assessment / Reassessment '[]'$  - 0 Reassessment of Co-morbidities (includes updates in patient status) '[]'$  - 0 Reassessment of Adherence to Treatment Plan ASSESSMENTS - Wound and Skin Joe ssessment / Reassessment X - Simple Wound Assessment / Reassessment - one wound 1 5 '[]'$  - 0 Complex Wound Assessment / Reassessment - multiple wounds '[]'$  - 0 Dermatologic / Skin Assessment (not related to wound area) ASSESSMENTS - Focused Assessment '[]'$  - 0 Circumferential Edema Measurements - multi extremities '[]'$  - 0 Nutritional Assessment / Counseling / Intervention Joe, Joe Joe ([id number]) [id number] Page 2 of 9 '[]'$  - 0 Lower Extremity Assessment (monofilament, tuning fork, pulses) '[]'$  - 0 Peripheral Arterial Disease Assessment (using hand held doppler) ASSESSMENTS - Ostomy and/or Continence Assessment and Care '[]'$  - 0 Incontinence Assessment and Management '[]'$  - 0 Ostomy Care Assessment and Management (repouching, etc.) PROCESS - Coordination of Care '[]'$  - 0 Simple Patient / Family Education for ongoing care X- 1 20 Complex (extensive) Patient / Family Education for ongoing care X- 1 10 Staff obtains Programmer, systems, Records, T Results / Process Orders est '[]'$  - 0 Staff telephones HHA, Nursing Homes / Clarify orders / etc '[]'$  - 0 Routine Transfer to another Facility (non-emergent condition) '[]'$  - 0 Routine Hospital Admission (non-emergent condition) '[]'$  - 0 New Admissions / Biomedical engineer / Ordering NPWT Apligraf, etc. , '[]'$  - 0 Emergency Hospital Admission (emergent condition) '[]'$  - 0 Simple Discharge Coordination X- 1 15 Complex (extensive) Discharge Coordination PROCESS - Special Needs '[]'$  - 0 Pediatric / Minor Patient Management '[]'$  - 0 Isolation Patient Management '[]'$  -  0 Hearing / Language / Visual special needs '[]'$  - 0 Assessment of Community assistance (transportation, D/C planning, etc.) '[]'$  - 0 Additional assistance / Altered mentation '[]'$  - 0 Support Surface(s) Assessment (bed, cushion, seat,  etc.) INTERVENTIONS - Wound Cleansing / Measurement X - Simple Wound Cleansing - one wound 1 5 '[]'$  - 0 Complex Wound Cleansing - multiple wounds X- 1 5 Wound Imaging (photographs - any number of wounds) '[]'$  - 0 Wound Tracing (instead of photographs) X- 1 5 Simple Wound Measurement - one wound '[]'$  - 0 Complex Wound Measurement - multiple wounds INTERVENTIONS - Wound Dressings X - Small Wound Dressing one or multiple wounds 1 10 '[]'$  - 0 Medium Wound Dressing one or multiple wounds '[]'$  - 0 Large Wound Dressing one or multiple wounds X- 1 5 Application of Medications - topical '[]'$  - 0 Application of Medications - injection INTERVENTIONS - Miscellaneous '[]'$  - 0 External ear exam '[]'$  - 0 Specimen Collection (cultures, biopsies, blood, body fluids, etc.) '[]'$  - 0 Specimen(s) / Culture(s) sent or taken to Lab for analysis '[]'$  - 0 Patient Transfer (multiple staff / Civil Service fast streamer / Similar devices) '[]'$  - 0 Simple Staple / Suture removal (25 or less) '[]'$  - 0 Complex Staple / Suture removal (26 or more) '[]'$  - 0 Hypo / Hyperglycemic Management (close monitor of Blood Glucose) Joe, Joe Joe ([id number]) [id number] Page 3 of 9 '[]'$  - 0 Ankle / Brachial Index (ABI) - do not check if billed separately X- 1 5 Vital Signs Has the patient been seen at the hospital within the last three years: Yes Total Score: 85 Level Of Care: New/Established - Level 3 Electronic Signature(s) Signed: 09/10/2022 5:26:06 PM By: Joe Hammock RN Entered By: Joe Joe on 09/10/2022 08:24:29 -------------------------------------------------------------------------------- Encounter Discharge Information Details Patient Name: Date of Service: Joe Herald,  [place] RK Joe. 09/10/2022 8:00 Joe Joe Medical Record Number: [id number] Patient Account Number: [id number] Date of Birth/Sex: Treating RN: 1956/02/04 (66 y.o. Joe Joe, Joe Joe Primary Care Kayler Rise: Joe Joe Other Clinician: Referring Tearia Gibbs: Treating Kanyia Heaslip/Extender: Joe Joe in Treatment: 2 Encounter Discharge Information Items Discharge Condition: Stable Ambulatory Status: Ambulatory Discharge Destination: Home Transportation: Private Auto Accompanied By: mom Schedule Follow-up Appointment: Yes Clinical Summary of Care: Patient Declined Electronic Signature(s) Signed: 09/10/2022 5:26:06 PM By: Joe Hammock RN Entered By: Joe Joe on 09/10/2022 08:25:14 -------------------------------------------------------------------------------- Lower Extremity Assessment Details Patient Name: Date of Service: Joe Herald, MA RK Joe. 09/10/2022 8:00 Joe Joe Medical Record Number: [id number] Patient Account Number: [id number] Date of Birth/Sex: Treating RN: 1955/12/06 (66 y.o. Erie Noe Primary Care Franchesca Veneziano: Joe Joe Other Clinician: Referring Trevonne Nyland: Treating Brooklyn Alfredo/Extender: Joe Joe in Treatment: 2 Electronic Signature(s) Signed: 09/10/2022 5:26:06 PM By: Joe Hammock RN Entered By: Joe Joe on 09/10/2022 08:07:08 -------------------------------------------------------------------------------- Multi Wound Chart Details Patient Name: Date of Service: Joe Joe, [place] Joe. 09/10/2022 8:00 Joe Joe Medical Record Number: [id number] Patient Account Number: [id number] Joe Joe, Joe Joe ([id number]) [phone].pdf Page 4 of 9 Date of Birth/Sex: Treating RN: 1956/08/21 (66 y.o. [name]) Primary Care Estefan Pattison: Other Clinician: Salena Joe Referring Jeovanny Cuadros: Treating Amahd Morino/Extender: Joe Joe in Treatment: 2 Vital  Signs Height(in): 67 Pulse(bpm): 67 Weight(lbs): 160 Blood Pressure(mmHg): 87/56 Body Mass Index(BMI): 25.1 Temperature(F): 98.7 Respiratory Rate(breaths/min): 17 [1:Photos:] [N/Joe:N/Joe] Sacrum N/Joe N/Joe Wound Location: Pressure Injury N/Joe N/Joe Wounding Event: Pressure Ulcer N/Joe N/Joe Primary Etiology: Coronary Artery Disease, N/Joe N/Joe Comorbid History: Hypertension, Osteoarthritis 08/07/2022 N/Joe N/Joe Date Acquired: 2 N/Joe N/Joe Weeks of Treatment: Open N/Joe N/Joe Wound Status: No N/Joe N/Joe Wound Recurrence: 4x3.2x1 N/Joe N/Joe Measurements L x W x D (cm) 10.053 N/Joe N/Joe Joe (cm) : rea 10.053  N/Joe N/Joe Volume (cm) : 41.80% N/Joe N/Joe % Reduction in Joe rea: 51.50% N/Joe N/Joe % Reduction in Volume: 10 Starting Position 1 (o'clock): 2 Ending Position 1 (o'clock): 5 Maximum Distance 1 (cm): Yes N/Joe N/Joe Undermining: Category/Stage IV N/Joe N/Joe Classification: Large N/Joe N/Joe Exudate Joe mount: Serosanguineous N/Joe N/Joe Exudate Type: red, brown N/Joe N/Joe Exudate Color: Yes N/Joe N/Joe Foul Odor Joe Cleansing: fter No N/Joe N/Joe Odor Joe nticipated Due to Product Use: Distinct, outline attached N/Joe N/Joe Wound Margin: Small (1-33%) N/Joe N/Joe Granulation Joe mount: Red, Pink N/Joe N/Joe Granulation Quality: Large (67-100%) N/Joe N/Joe Necrotic Joe mount: Fat Layer (Subcutaneous Tissue): Yes N/Joe N/Joe Exposed Structures: Muscle: Yes Fascia: No Tendon: No Joint: No Bone: No Small (1-33%) N/Joe N/Joe Epithelialization: Excoriation: No N/Joe N/Joe Periwound Skin Texture: Induration: No Callus: No Crepitus: No Rash: No Scarring: No Maceration: No N/Joe N/Joe Periwound Skin Moisture: Dry/Scaly: No Atrophie Blanche: No N/Joe N/Joe Periwound Skin Color: Cyanosis: No Ecchymosis: No Erythema: No Hemosiderin Staining: No Mottled: No Pallor: No Rubor: No Yes N/Joe N/Joe Tenderness on Palpation: Treatment Notes Wound #1 (Sacrum) Joe, Joe Joe ([id number]) [id number] Page 5 of 9 Cleanser Soap and  Water Discharge Instruction: May shower and wash wound with dial antibacterial soap and water prior to dressing change. Wound Cleanser Discharge Instruction: Cleanse the wound with wound cleanser prior to applying Joe clean dressing using gauze sponges, not tissue or cotton balls. Peri-Wound Care Skin Prep Discharge Instruction: Use skin prep as directed Topical Primary Dressing Dakin's Solution 0.25%, 16 (oz) Discharge Instruction: Moisten gauze with Dakin's solution Secondary Dressing ABD Pad, 5x9 Discharge Instruction: Apply over primary dressing as directed. Secured With 52M Medipore H Soft Cloth Surgical T ape, 4 x 10 (in/yd) Discharge Instruction: Secure with tape as directed. Compression Wrap Compression Stockings Add-Ons Electronic Signature(s) Signed: 09/10/2022 9:11:53 AM By: Kalman Shan DO Entered By: Kalman Shan on 09/10/2022 08:43:22 -------------------------------------------------------------------------------- Multi-Disciplinary Care Plan Details Patient Name: Date of Service: Joe Herald, [place] RK Joe. 09/10/2022 8:00 Joe Joe Medical Record Number: [id number] Patient Account Number: [id number] Date of Birth/Sex: Treating RN: 02/18/1956 (66 y.o. Joe Joe, Joe Joe Primary Care Aspyn Warnke: Joe Joe Other Clinician: Referring Robynne Roat: Treating Raquel Racey/Extender: Joe Joe in Treatment: 2 Active Inactive Wound/Skin Impairment Nursing Diagnoses: Impaired tissue integrity Knowledge deficit related to ulceration/compromised skin integrity Goals: Patient will have Joe decrease in wound volume by X% from date: (specify in notes) Date Initiated: 08/27/2022 Target Resolution Date: 09/18/2022 Goal Status: Active Patient/caregiver will verbalize understanding of skin care regimen Date Initiated: 08/27/2022 Target Resolution Date: 09/19/2022 Goal Status: Active Ulcer/skin breakdown will have Joe volume reduction of 30% by week  4 Date Initiated: 08/27/2022 Target Resolution Date: 09/18/2022 Goal Status: Active Interventions: Assess patient/caregiver ability to obtain necessary supplies [place], [place] Joe ([id number]) [id number] Page 6 of 9 Assess patient/caregiver ability to perform ulcer/skin care regimen upon admission and as needed Assess ulceration(s) every visit Notes: Electronic Signature(s) Signed: 09/10/2022 5:26:06 PM By: Joe Hammock RN Entered By: Joe Joe on 09/10/2022 08:23:50 -------------------------------------------------------------------------------- Pain Assessment Details Patient Name: Date of Service: Joe Joe, [place] RK Joe. 09/10/2022 8:00 Joe Joe Medical Record Number: [id number] Patient Account Number: [id number] Date of Birth/Sex: Treating RN: 02-14-1956 (66 y.o. Erie Noe Primary Care Benigna Delisi: Joe Joe Other Clinician: Referring Alika Eppes: Treating Charlee Squibb/Extender: Joe Joe in Treatment: 2 Active Problems Location of Pain Severity and Description of Pain Patient Has Paino No Site Locations Pain Management and Medication Current  Pain Management: Electronic Signature(s) Signed: 09/10/2022 5:26:06 PM By: Joe Hammock RN Entered By: Joe Joe on 09/10/2022 08:07:00 -------------------------------------------------------------------------------- Patient/Caregiver Education Details Patient Name: Date of Service: Joe Herald, MA RK Joe. 11/30/2023andnbsp8:00 [place] Record Number: [id number] Patient Account Number: [id number] Date of Birth/Gender: Treating RN: 03/29/1956 (66 y.o. Erie Noe Primary Care Physician: Joe Joe Other Clinician: Referring Physician: Treating Physician/Extender: Joe Joe in Treatment: [place], [place] Joe ([id number]) [id number] Page 7 of 9 Education Assessment Education  Provided To: Patient Education Topics Provided Wound/Skin Impairment: Methods: Explain/Verbal Responses: Reinforcements needed, State content correctly Electronic Signature(s) Signed: 09/10/2022 5:26:06 PM By: Joe Hammock RN Entered By: Joe Joe on 09/10/2022 08:19:58 -------------------------------------------------------------------------------- Wound Assessment Details Patient Name: Date of Service: Joe Herald, [place] Joe. 09/10/2022 8:00 Joe Joe Medical Record Number: [id number] Patient Account Number: [id number] Date of Birth/Sex: Treating RN: 06-Mar-1956 (66 y.o. Joe Joe, Joe Joe Primary Care Hung Rhinesmith: Joe Joe Other Clinician: Referring Derryl Uher: Treating Braylea Brancato/Extender: Christian Mate Weeks in Treatment: 2 Wound Status Wound Number: 1 Primary Etiology: Pressure Ulcer Wound Location: Sacrum Wound Status: Open Wounding Event: Pressure Injury Comorbid History: Coronary Artery Disease, Hypertension, Osteoarthritis Date Acquired: 08/07/2022 Weeks Of Treatment: 2 Clustered Wound: No Photos Wound Measurements Length: (cm) 4 Width: (cm) 3.2 Depth: (cm) 1 Area: (cm) 10.053 Volume: (cm) 10.053 % Reduction in Area: 41.8% % Reduction in Volume: 51.5% Epithelialization: Small (1-33%) Tunneling: No Undermining: Yes Starting Position (o'clock): 10 Ending Position (o'clock): 2 Maximum Distance: (cm) 5 Wound Description Classification: Category/Stage IV Wound Margin: Distinct, outline attached Exudate Amount: Large Exudate Type: Serosanguineous Exudate Color: red, brown Joe, Joe Joe ([id number]) Wound Bed Granulation Amount: Small (1-33% Granulation Quality: Red, Pink Necrotic Amount: Large (67-10 Necrotic Quality: Adherent Slo Foul Odor After Cleansing: Yes Due to Product Use: No Slough/Fibrino Yes [id number] Page 8 of 9 ) Exposed Structure Fascia Exposed: No 0%) Fat Layer (Subcutaneous  Tissue) Exposed: Yes ugh Tendon Exposed: No Muscle Exposed: Yes Necrosis of Muscle: Yes Joint Exposed: No Bone Exposed: No Periwound Skin Texture Texture Color No Abnormalities Noted: No No Abnormalities Noted: No Callus: No Atrophie Blanche: No Crepitus: No Cyanosis: No Excoriation: No Ecchymosis: No Induration: No Erythema: No Rash: No Hemosiderin Staining: No Scarring: No Mottled: No Pallor: No Moisture Rubor: No No Abnormalities Noted: No Dry / Scaly: No Temperature / Pain Maceration: No Tenderness on Palpation: Yes Treatment Notes Wound #1 (Sacrum) Cleanser Soap and Water Discharge Instruction: May shower and wash wound with dial antibacterial soap and water prior to dressing change. Wound Cleanser Discharge Instruction: Cleanse the wound with wound cleanser prior to applying Joe clean dressing using gauze sponges, not tissue or cotton balls. Peri-Wound Care Skin Prep Discharge Instruction: Use skin prep as directed Topical Primary Dressing Dakin's Solution 0.25%, 16 (oz) Discharge Instruction: Moisten gauze with Dakin's solution Secondary Dressing ABD Pad, 5x9 Discharge Instruction: Apply over primary dressing as directed. Secured With 45M Medipore H Soft Cloth Surgical T ape, 4 x 10 (in/yd) Discharge Instruction: Secure with tape as directed. Compression Wrap Compression Stockings Add-Ons Electronic Signature(s) Signed: 09/10/2022 5:26:06 PM By: Joe Hammock RN Entered By: Joe Joe on 09/10/2022 08:14:36 -------------------------------------------------------------------------------- Vitals Details Patient Name: Date of Service: Joe Joe, [place] RK Joe. 09/10/2022 8:00 Joe Joe Medical Record Number: [id number] Patient Account Number: [id number] Date of Birth/Sex: Treating RN: 01-22-1956 (66 y.o. Joe Joe, [place]ida Rol[place], [place] Joe ([id number].pdf Page 9 of 9 Primary Care Camyla Camposano: Joe Joe Other  Clinician:  Referring Tamicka Shimon: Treating Staley Budzinski/Extender: Joe Joe in Treatment: 2 Vital Signs Time Taken: 08:06 Temperature (F): 98.7 Height (in): 67 Pulse (bpm): 74 Weight (lbs): 160 Respiratory Rate (breaths/min): 17 Body Mass Index (BMI): 25.1 Blood Pressure (mmHg): 87/56 Reference Range: 80 - 120 mg / dl Electronic Signature(s) Signed: 09/10/2022 5:26:06 PM By: Joe Hammock RN Entered By: Joe Joe on 09/10/2022 08:06:52

## 2022-09-14 ENCOUNTER — Ambulatory Visit (HOSPITAL_COMMUNITY)
Admission: RE | Admit: 2022-09-14 | Discharge: 2022-09-14 | Disposition: A | Discharge status: Home / Self Care | Payer: Medicare Other | Source: Ambulatory Visit | Attending: Cardiology | Admitting: Cardiology

## 2022-09-14 DIAGNOSIS — I5042 Chronic combined systolic (congestive) and diastolic (congestive) heart failure: Secondary | ICD-10-CM | POA: Insufficient documentation

## 2022-09-14 LAB — COMPREHENSIVE METABOLIC PANEL
ALT: 15 U/L (ref 0–44)
AST: 15 U/L (ref 15–41)
Albumin: 2.8 g/dL — ABNORMAL LOW (ref 3.5–5.0)
Alkaline Phosphatase: 72 U/L (ref 38–126)
Anion gap: 4 — ABNORMAL LOW (ref 5–15)
BUN: 18 mg/dL (ref 8–23)
CO2: 26 mmol/L (ref 22–32)
Calcium: 8.7 mg/dL — ABNORMAL LOW (ref 8.9–10.3)
Chloride: 107 mmol/L (ref 98–111)
Creatinine, Ser: 0.71 mg/dL (ref 0.61–1.24)
GFR, Estimated: >60 mL/min (ref >60)
Glucose, Bld: 127 mg/dL — ABNORMAL HIGH (ref 70–99)
Potassium: 4.2 mmol/L (ref 3.5–5.1)
Sodium: 137 mmol/L (ref 135–145)
Total Bilirubin: 0.4 mg/dL (ref 0.3–1.2)
Total Protein: 5.9 g/dL — ABNORMAL LOW (ref 6.5–8.1)

## 2022-09-23 ENCOUNTER — Other Ambulatory Visit: Payer: Self-pay | Admitting: Orthopedic Surgery

## 2022-09-23 DIAGNOSIS — M48061 Spinal stenosis, lumbar region without neurogenic claudication: Secondary | ICD-10-CM

## 2022-09-23 NOTE — Progress Notes (Incomplete)
Advanced Heart Failure Clinic Note   Referring Physician: PCP: Willey Blade, MD PCP-Cardiologist: Skeet Latch, MD  AHF: Dr. Haroldine Laws   Reason for Visit: CHF f/u  HPI:  66 y/o male recently found to have severe LV dysfunction in setting of markedly dilated aortic root (67 mm) and severe AI (tricuspid AoV). Echo 9/23 EF 25-30%, LV severely dilated, severe AI, Global HK, RV ok. LBBB also noted. [facility] 9/23 showed single vessel CAD w/ 60-70% pRCA stenosis, patent LM, LAD and LCx. RHC w/ low filling pressures and preserved CO, CI 2.53.    Underwent Bentall procedure + CABG x 1, SVR-RCA, on 9/26. Intraoperative TEE showed LVEF 20-25%, RV normal. Normal functioning bioprosthetic AoV w/ no AI and no perivalvular leak post repair. He had a prolonged hospital course as post op recovery was complicated by post-cardiotomy shock slow to wean off pressors, severe ileus/constipation, urinary retention/ obstruction requiring placement of foley catheter, atrial fibrillation requiring DCCV, RUE PICC Line Associated DVT, deconditioning and sacral wound. Home health arranged at discharge. Referred to outpatient wound clinic for ongoing management of sacral wound.   Of note, he was discharged home on 11/2. On 11/16 he was diagnosed w/ UTI. Started on abx.   Had EP f/u 11/19. Given persistent sacral wound, decision made to defer CRT-D implant until healed given infection risk. He has return f/u again w/ EP on 1/10 to revisit.   Seen for hospital follow-up 11/22. Mildly volume overloaded. Arlyce Harman increased to 12.5 mg BID. Med titration limited by soft BP.     Review of Systems: [y] = yes, '[ ]'$  = no   General: Weight gain '[ ]'$ ; Weight loss '[ ]'$ ; Anorexia '[ ]'$ ; Fatigue '[ ]'$ ; Fever '[ ]'$ ; Chills '[ ]'$ ; Weakness '[ ]'$   Cardiac: Chest pain/pressure '[ ]'$ ; Resting SOB '[ ]'$ ; Exertional SOB [Y ]; Orthopnea '[ ]'$ ; Pedal Edema [Y ]; Palpitations '[ ]'$ ; Syncope '[ ]'$ ; Presyncope '[ ]'$ ; Paroxysmal nocturnal dyspnea'[ ]'$   Pulmonary:  Cough '[ ]'$ ; Wheezing'[ ]'$ ; Hemoptysis'[ ]'$ ; Sputum '[ ]'$ ; Snoring '[ ]'$   GI: Vomiting'[ ]'$ ; Dysphagia'[ ]'$ ; Melena'[ ]'$ ; Hematochezia '[ ]'$ ; Heartburn'[ ]'$ ; Abdominal pain '[ ]'$ ; Constipation '[ ]'$ ; Diarrhea '[ ]'$ ; BRBPR '[ ]'$   GU: Hematuria'[ ]'$ ; Dysuria '[ ]'$ ; Nocturia'[ ]'$   Vascular: Pain in legs with walking '[ ]'$ ; Pain in feet with lying flat '[ ]'$ ; Non-healing sores [ Y]; Stroke '[ ]'$ ; TIA '[ ]'$ ; Slurred speech '[ ]'$ ;  Neuro: Headaches'[ ]'$ ; Vertigo'[ ]'$ ; Seizures'[ ]'$ ; Paresthesias'[ ]'$ ;Blurred vision '[ ]'$ ; Diplopia '[ ]'$ ; Vision changes '[ ]'$   Ortho/Skin: Arthritis [ Y]; Joint pain [Y ]; Muscle pain [ Y]; Joint swelling '[ ]'$ ; Back Pain '[ ]'$ ; Rash '[ ]'$   Psych: Depression[ Y]; Anxiety'[ ]'$   Heme: Bleeding problems '[ ]'$ ; Clotting disorders '[ ]'$ ; Anemia '[ ]'$   Endocrine: Diabetes '[ ]'$ ; Thyroid dysfunction'[ ]'$    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     amiodarone (PACERONE) 200 MG tablet Take 1 tablet (200 mg total) by mouth daily. 30 tablet 1   apixaban (ELIQUIS) 5 MG TABS tablet Take 1 tablet (5 mg total) by  mouth 2 (two) times daily. 60 tablet 1   aspirin EC 81 MG tablet Take 1 tablet (81 mg total) by mouth daily. Swallow whole. 30 tablet 12   atorvastatin (LIPITOR) 80 MG tablet Take 1 tablet (80 mg total) by mouth daily. 30 tablet 1   Cholecalciferol (VITAMIN D3) 250 MCG (10000 UT) capsule Take 10,000 Units by mouth daily.     Coenzyme Q10 (CO Q-10) 100 MG CAPS Take 100 mg by mouth daily.     fluticasone (FLONASE) 50 MCG/ACT nasal spray Place 2 sprays into both nostrils daily. (Patient taking differently: Place 2 sprays into both nostrils daily as needed for allergies.) 16 g 1   furosemide (LASIX) 20 MG tablet Take 2 tablets (40 mg total) by mouth daily. 60 tablet 6   leptospermum manuka honey (MEDIHONEY) PSTE paste Apply 1 Application topically daily. 15 mL 1   OVER THE COUNTER MEDICATION PRUNE JUICE/FLAXSEED     potassium chloride SA (KLOR-CON M) 20 MEQ tablet Take 1 tablet (20 mEq total) by mouth as needed (Take one tab on the days you require lasix). 30 tablet 1   sacubitril-valsartan (ENTRESTO) 49-51 MG Take 1 tablet by mouth 2 (two) times daily. 60 tablet 1   spironolactone (ALDACTONE) 25 MG tablet Take 0.5 tablets (12.5 mg total) by mouth 2 (two) times daily. 30 tablet 5   tamsulosin (FLOMAX) 0.4 MG CAPS capsule Take 0.4 mg by mouth 2 (two) times daily.     traMADol (ULTRAM) 50 MG tablet Take 1 tablet (50 mg total) by mouth every 6 (six) hours as needed for moderate pain. 30 tablet 0   No current facility-administered medications for this visit.    Allergies  Allergen Reactions   Codeine Nausea Only   Levaquin [Levofloxacin In D5w] Diarrhea      Social History   Socioeconomic History   Marital status: Single    Spouse name: Not on file   Number of children: Not on file   Years of education: Not on file   Highest education level: Not on file  Occupational History   Occupation: unemployed  Tobacco Use   Smoking status: Never   Smokeless tobacco: Never  Vaping Use   Vaping  Use: Never used  Substance and Sexual Activity   Alcohol use: No   Drug use: No   Sexual activity: Not Currently  Other Topics Concern   Not on file  Social History Narrative   Pt with delayed mental capabilities, 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  Lives with parents.   Social Determinants of Health   Financial Resource Strain: Low Risk  (10/11/2019)   Overall Financial Resource Strain (CARDIA)  Difficulty of Paying Living Expenses: Not hard at all  Food Insecurity: No Food Insecurity (07/19/2022)   Hunger Vital Sign    Worried About Running Out of Food in the Last Year: Never true    Ran Out of Food in the Last Year: Never true  Transportation Needs: No Transportation Needs (07/19/2022)   PRAPARE - Hydrologist (Medical): No    Lack of Transportation (Non-Medical): No  Physical Activity: Sufficiently Active (10/11/2019)   Exercise Vital Sign    Days of Exercise per Week: 5 days    Minutes of Exercise per Session: 40 min  Stress: Stress Concern Present (10/11/2019)   Stollings    Feeling of Stress : To some extent  Social Connections: Not on file  Intimate Partner Violence: Not At Risk (07/19/2022)   Humiliation, Afraid, Rape, and Kick questionnaire    Fear of Current or Ex-Partner: No    Emotionally Abused: No    Physically Abused: No    Sexually Abused: No      Family History  Problem Relation Age of Onset   Heart failure Mother    Atrial fibrillation Mother    Heart failure Maternal Grandmother    Atrial fibrillation Maternal Grandmother     There were no vitals filed for this visit.    PHYSICAL EXAM: General:  fatigued appearing. No respiratory difficulty, kyphotic posture  HEENT: normal Neck: supple. no JVD. Carotids 2+ bilat; no bruits. No lymphadenopathy or  thyromegaly appreciated. Cor: PMI nondisplaced. Regular rate & rhythm. No rubs, gallops or murmurs. Lungs: clear Abdomen: soft, nontender, nondistended. No hepatosplenomegaly. No bruits or masses. Good bowel sounds. Extremities: no cyanosis, clubbing, rash, 1+ b/l ankle edema Neuro: alert & oriented x 3, cranial nerves grossly intact. moves all 4 extremities w/o difficulty. Affect pleasant.  ECG: LBBB 190 ms, appears regular but no clear p waves, 62 bpm. QTc 527 ms    ASSESSMENT & PLAN:  1. Chronic Systolic Heart Failure  - newly diagnosed; Echo 9/23 EF 25-30%, LV severely dilated, severe AI, Global HK, RV ok  - NICM, severity out of proportion to degree of CAD  - likely valvular CM in setting of severe AI +/- LBBB CM  - now post op bioprosthetic AVR + CABG 9/23.  - Intraoperative TEE LVEF 20-25%, RV ok - Echo 10/9- EF 25% RV function improving. Moderate to severe MR (in setting of LBBB) AVR ok   - Echo 10/23-  EF 20% RV moderately reduced. Moderate to severe PI. AVR ok  - Echo 09/03/22: EF < 20%, RV mildly reduced, normal functioning aortic valve prosthesis - NYHA ***. Volume *** - Continue Spiro 12.5 mg bid - Continue Entresto 49-51 mg twice a day   - With junctional rhythm, will hold off on beta blocker and digoxin for now to avoid worsening A-V conduction.  - would avoid SGLT2i given h/o urinary retention and recent UTI  - Instructed to use TED hoses to help w/ LEE and reduce risk of orthostasis  - w/ LBBB, will need eventual CRT-D (see below)   2. Ascending Aortic Aneurysm/ Severe AI  - CTA  w/ severe dilation at the sinus of Valsalva, 67 mm. Moderate dilation of mid ascending aorta, 44 mm. Tricuspid AoV - s/p Bentall with bioprosthetic aortic valve 9/27 - intraoperative TEE, no perivalvular leak post repair  - Echo 09/03/22: EF < 20%, RV mildly reduced, normal functioning aortic valve prosthesis  3. CAD - single vessel, 60-70% pRCA  - s/p CABG x 1, SVG- RCA  - ASA + statin   - No BB for now w/ bradycardia    4. LBBB - Will eventually need epicardial lead hooked up for CRT once sacral wound heals  - Has f/u w/ EP 1/10   5. Atrial fib/flutter, post-op - required DCCV during hospital stay>>NSR - unable to see p waves on today's EKG, though appears regular. RRR on ascultation. HR 60s  - Continue Amiodarone 200 mg daily. Check CMP and TSH today  - QTc 527 ms. Check CMP and Mg today  - Eliquis 5 mg bid. Check CBC   6. Mitral Regurgitation  - mod-severe on echo 10/23, in setting of LBBB - planning HF optimization w/ CRT-D as outlined above    7. RUE PICC Line Associated DVT  - complication recent hospitalization  - continue Eliquis   8. Sacral Wound - followed by wound clinic   9. Severe Hip Pain, R>L  - h/o b/l THR - recently seen recently by ortho, pain felt 2/2 inactivity. Had no improvement after Toradol injection  - f/u with PCP for pain management   F/u ***    Chrstopher Malenfant N, PA-C 09/23/22

## 2022-09-24 ENCOUNTER — Encounter (HOSPITAL_BASED_OUTPATIENT_CLINIC_OR_DEPARTMENT_OTHER): Payer: Medicare Other | Attending: Internal Medicine | Admitting: Internal Medicine

## 2022-09-24 ENCOUNTER — Encounter (HOSPITAL_COMMUNITY): Payer: Medicare Other

## 2022-09-24 ENCOUNTER — Telehealth (HOSPITAL_COMMUNITY): Payer: Self-pay | Admitting: Physician Assistant

## 2022-09-24 DIAGNOSIS — Z951 Presence of aortocoronary bypass graft: Secondary | ICD-10-CM | POA: Insufficient documentation

## 2022-09-24 DIAGNOSIS — I5042 Chronic combined systolic (congestive) and diastolic (congestive) heart failure: Secondary | ICD-10-CM | POA: Insufficient documentation

## 2022-09-24 DIAGNOSIS — I447 Left bundle-branch block, unspecified: Secondary | ICD-10-CM | POA: Diagnosis not present

## 2022-09-24 DIAGNOSIS — I110 Hypertensive heart disease with heart failure: Secondary | ICD-10-CM | POA: Diagnosis not present

## 2022-09-24 DIAGNOSIS — L89154 Pressure ulcer of sacral region, stage 4: Secondary | ICD-10-CM | POA: Insufficient documentation

## 2022-09-24 DIAGNOSIS — I2510 Atherosclerotic heart disease of native coronary artery without angina pectoris: Secondary | ICD-10-CM | POA: Insufficient documentation

## 2022-09-24 DIAGNOSIS — M1990 Unspecified osteoarthritis, unspecified site: Secondary | ICD-10-CM | POA: Insufficient documentation

## 2022-09-24 NOTE — Progress Notes (Signed)
Mccormack, Kashtyn A ([id number]) [id number] Page 1 of 7 Visit Report for 09/24/2022 Chief Complaint Document Details Patient Name: Date of Service: Joe [name], [place] RK A. 09/24/2022 8:45 A M Medical Record Number: [id number] Patient Account Number: [id number] Date of Birth/Sex: Treating RN: 1956-09-07 (66 y.o. M) Primary Care Provider: Salena Saner Other Clinician: Referring Provider: Treating Provider/Extender: Ether Griffins in Treatment: 4 Information Obtained from: Patient Chief Complaint 08/27/2022; patient presents for sacral wound Electronic Signature(s) Signed: 09/24/2022 10:29:51 AM By: Kalman Shan DO Entered By: Kalman Shan on 09/24/2022 09:56:57 -------------------------------------------------------------------------------- HPI Details Patient Name: Date of Service: Joe [name], [place] RK A. 09/24/2022 8:45 A M Medical Record Number: [id number] Patient Account Number: [id number] Date of Birth/Sex: Treating RN: 1955/12/16 (66 y.o. M) Primary Care Provider: Salena Saner Other Clinician: Referring Provider: Treating Provider/Extender: Ether Griffins in Treatment: 4 History of Present Illness HPI Description: 08/27/2022 Mr. Jamesmichael Shadd is a 66 year old male with a past medical history of chronic combined systolic diastolic heart failure, coronary artery disease status post CABG x1, left bundle branch block That presents to the clinic for a sacral ulcer. Patient was admitted to the hospital on 07/08/2022 For 6 weeks and discharged on 08/13/2022 For placement of a bioprosthetic aortic valve due to an ascending aortic aneurysm. During this admission patient states that he developed the sacral ulcer. He is not bedbound and ambulates with a walker. He is currently living with his mother and home health comes out for dressing changes. Currently he is using Medihoney every 3 days.  There is mild odor. Currently denies systemic signs of infection. 11/30; patient presents for follow-up. He has been using Dakin's wet-to-dry dressings mother is present during the encounter. He has no issues or complaints today. He denies signs of infection. 12/14; patient presents for follow-up. He has been using Dakin's wet-to-dry dressings. He has no issues or complaints today. He denies signs of infection. Electronic Signature(s) Signed: 09/24/2022 10:29:51 AM By: Kalman Shan DO Entered By: Kalman Shan on 09/24/2022 09:57:28 Physical Exam Details -------------------------------------------------------------------------------- Jacalyn Lefevre A ([id number]) [id number] Page 2 of 7 Patient Name: Date of Service: Joe [name], [place] RK A. 09/24/2022 8:45 A M Medical Record Number: [id number] Patient Account Number: [id number] Date of Birth/Sex: Treating RN: Aug 13, 1956 (67 y.o. M) Primary Care Provider: Salena Saner Other Clinician: Referring Provider: Treating Provider/Extender: Ether Griffins in Treatment: 4 Constitutional respirations regular, non-labored and within target range for patient.[name] Kitchen Psychiatric pleasant and cooperative. Notes Sacral ulcer: Open wound with granulation tissue at the opening. This does undermine significantly for about 5 cm from the 10 to 1 o'clock position. No probing to bone. No signs of overt infection. Electronic Signature(s) Signed: 09/24/2022 10:29:51 AM By: Kalman Shan DO Entered By: Kalman Shan on 09/24/2022 09:57:56 -------------------------------------------------------------------------------- Physician Orders Details Patient Name: Date of Service: Joe [name], [place] RK A. 09/24/2022 8:45 A M Medical Record Number: [id number] Patient Account Number: [id number] Date of Birth/Sex: Treating RN: 12/19/1955 (66 y.o. [place] Noe Primary Care Provider: Salena Saner  Other Clinician: Referring Provider: Treating Provider/Extender: Ether Griffins in Treatment: 4 Verbal / Phone Orders: No Diagnosis Coding Follow-up Appointments ppointment in 2 weeks. - w/ Dr. Heber [place] Return A Anesthetic (In clinic) Topical Lidocaine 5% applied to wound bed Bathing/ Shower/ Hygiene May shower with protection but do not get wound dressing(s) wet. Off-Loading Turn and reposition every 2 hours Home Health No change in wound care  orders this week; continue Home Health for wound care. May utilize formulary equivalent dressing for wound treatment orders unless otherwise specified. - Advance to change 3x a week. Pt.'s mother will change all other times. Wound Treatment Wound #1 - Sacrum Cleanser: Soap and Water ([place]) 2 x Per AUQ/33 Days Discharge Instructions: May shower and wash wound with dial antibacterial soap and water prior to dressing change. Cleanser: Wound Cleanser (Home Health) 2 x Per Day/15 Days Discharge Instructions: Cleanse the wound with wound cleanser prior to applying a clean dressing using gauze sponges, not tissue or cotton balls. Peri-Wound Care: Skin Prep (Home Health) 2 x Per Day/15 Days Discharge Instructions: Use skin prep as directed Prim Dressing: Dakin's Solution 0.25%, 16 (oz) (Home Health) (Generic) 2 x Per Day/15 Days ary Discharge Instructions: Moisten gauze with Dakin's solution Secondary Dressing: ABD Pad, 5x9 (Home Health) (Generic) 2 x Per Day/15 Days Discharge Instructions: Apply over primary dressing as directed. Secured With: 7M Medipore H Soft Cloth Surgical Tape, 4 x 10 (in/yd) (Home Health) (Generic) 2 x Per Day/15 Days Woolman, Herley A ([id number]) [id number] Page 3 of 7 Discharge Instructions: Secure with tape as directed. Electronic Signature(s) Signed: 09/24/2022 10:29:51 AM By: Kalman Shan DO Entered By: Kalman Shan on 09/24/2022  09:58:10 -------------------------------------------------------------------------------- Problem List Details Patient Name: Date of Service: Joe Herald, [place] A. 09/24/2022 8:45 A M Medical Record Number: [id number] Patient Account Number: [id number] Date of Birth/Sex: Treating RN: 1955-10-18 (66 y.o. M) Primary Care Provider: Salena Saner Other Clinician: Referring Provider: Treating Provider/Extender: Ether Griffins in Treatment: 4 Active Problems ICD-10 Encounter Code Description Active Date MDM Diagnosis L89.154 Pressure ulcer of sacral region, stage 4 08/27/2022 No Yes I50.42 Chronic combined systolic (congestive) and diastolic (congestive) heart failure 08/27/2022 No Yes Z95.1 Presence of aortocoronary bypass graft 08/27/2022 No Yes Inactive Problems Resolved Problems Electronic Signature(s) Signed: 09/24/2022 10:29:51 AM By: Kalman Shan DO Entered By: Kalman Shan on 09/24/2022 09:56:44 -------------------------------------------------------------------------------- Progress Note Details Patient Name: Date of Service: Joe [name], [place] RK A. 09/24/2022 8:45 A M Medical Record Number: [id number] Patient Account Number: [id number] Date of Birth/Sex: Treating RN: Oct 21, 1955 (66 y.o. M) Primary Care Provider: Salena Saner Other Clinician: Referring Provider: Treating Provider/Extender: Ether Griffins in Treatment: 4 Subjective Chief Complaint Information obtained from Patient 08/27/2022; patient presents for sacral wound [place], [place] A ([id number]) [id number] Page 4 of 7 History of Present Illness (HPI) 08/27/2022 Mr. Braedan Meuth is a 66 year old male with a past medical history of chronic combined systolic diastolic heart failure, coronary artery disease status post CABG x1, left bundle branch block That presents to the clinic for a sacral ulcer. Patient was  admitted to the hospital on 07/08/2022 For 6 weeks and discharged on 08/13/2022 For placement of a bioprosthetic aortic valve due to an ascending aortic aneurysm. During this admission patient states that he developed the sacral ulcer. He is not bedbound and ambulates with a walker. He is currently living with his mother and home health comes out for dressing changes. Currently he is using Medihoney every 3 days. There is mild odor. Currently denies systemic signs of infection. 11/30; patient presents for follow-up. He has been using Dakin's wet-to-dry dressings mother is present during the encounter. He has no issues or complaints today. He denies signs of infection. 12/14; patient presents for follow-up. He has been using Dakin's wet-to-dry dressings. He has no issues or complaints today. He denies signs of infection. Patient History  Family History Heart Disease - Mother,Maternal Grandparents. Social History Never smoker, Alcohol Use - Never, Drug Use - No History, Caffeine Use - Never. Medical History Cardiovascular Patient has history of Coronary Artery Disease, Hypertension Musculoskeletal Patient has history of Osteoarthritis - bilateral hips; left shoulder Hospitalization/Surgery History - cardioversions 07/2022. - CABG 07/08/2022. - hip replacements 2015. - 2018 left shoulder surgery. - epicardial pacing lead placement 07/08/2022. Medical A Surgical History Notes nd Constitutional Symptoms (General Health) cognitive deficits congenital brain damage expressive speech delay Cardiovascular PVC LBBB thoracic ascending aortic aneurysm epicardial pacing lead placement Gastrointestinal umbilical hernia Genitourinary kidney stones Objective Constitutional respirations regular, non-labored and within target range for patient.. Vitals Time Taken: 9:05 AM, Height: 67 in, Weight: 160 lbs, BMI: 25.1, Temperature: 97.8 F, Pulse: 65 bpm, Respiratory Rate: 20 breaths/min, Blood Pressure: 80/50  mmHg. General Notes: BP with machine 80/47. Manual 80/50. Per mother patient is taking in fluid with his medications. Explained to mother patient may need to be seen by ED, urgent care, or PCP related to low BP. Provider and case manager made aware. Psychiatric pleasant and cooperative. General Notes: Sacral ulcer: Open wound with granulation tissue at the opening. This does undermine significantly for about 5 cm from the 10 to 1 o'clock position. No probing to bone. No signs of overt infection. Integumentary (Hair, Skin) Wound #1 status is Open. Original cause of wound was Pressure Injury. The date acquired was: 08/07/2022. The wound has been in treatment 4 weeks. The wound is located on the Sacrum. The wound measures 1.9cm length x 1.9cm width x 0.9cm depth; 2.835cm^2 area and 2.552cm^3 volume. There is muscle and Fat Layer (Subcutaneous Tissue) exposed. There is no tunneling noted, however, there is undermining starting at 10:00 and ending at 12:00 with a maximum distance of 5.5cm. There is a medium amount of serosanguineous drainage noted. The wound margin is distinct with the outline attached to the wound base. There is large (67-100%) red, pink granulation within the wound bed. There is no necrotic tissue within the wound bed. The periwound skin appearance did not exhibit: Callus, Crepitus, Excoriation, Induration, Rash, Scarring, Dry/Scaly, Maceration, Atrophie Blanche, Cyanosis, Ecchymosis, Hemosiderin Staining, Mottled, Pallor, Rubor, Erythema. The periwound has tenderness on palpation. Assessment Active Problems ICD-10 Pressure ulcer of sacral region, stage 4 Chronic combined systolic (congestive) and diastolic (congestive) heart failure Presence of aortocoronary bypass graft Koopmann, Atley A ([id number]) [id number] Page 5 of 7 Patient's wound has improved in size and appearance at the opening. The undermining is overall stable. I recommended at this time  continuing Dakin's wet-to- dry dressings. We discussed potentially doing a wound VAC in the near future if we are not seeing improvement in the undermining. Follow-up in 2 weeks. Plan Follow-up Appointments: Return Appointment in 2 weeks. - w/ Dr. Heber [place] Anesthetic: (In clinic) Topical Lidocaine 5% applied to wound bed Bathing/ Shower/ Hygiene: May shower with protection but do not get wound dressing(s) wet. Off-Loading: Turn and reposition every 2 hours Home Health: No change in wound care orders this week; continue Home Health for wound care. May utilize formulary equivalent dressing for wound treatment orders unless otherwise specified. - Advance to change 3x a week. Pt.'s mother will change all other times. WOUND #1: - Sacrum Wound Laterality: Cleanser: Soap and Water ([place]) 2 x Per Day/15 Days Discharge Instructions: May shower and wash wound with dial antibacterial soap and water prior to dressing change. Cleanser: Wound Cleanser (Home Health) 2 x Per Day/15 Days Discharge Instructions: Cleanse the wound  with wound cleanser prior to applying a clean dressing using gauze sponges, not tissue or cotton balls. Peri-Wound Care: Skin Prep (Home Health) 2 x Per Day/15 Days Discharge Instructions: Use skin prep as directed Prim Dressing: Dakin's Solution 0.25%, 16 (oz) (Home Health) (Generic) 2 x Per Day/15 Days ary Discharge Instructions: Moisten gauze with Dakin's solution Secondary Dressing: ABD Pad, 5x9 (Home Health) (Generic) 2 x Per Day/15 Days Discharge Instructions: Apply over primary dressing as directed. Secured With: 3M Medipore H Soft Cloth Surgical T ape, 4 x 10 (in/yd) (Home Health) (Generic) 2 x Per Day/15 Days Discharge Instructions: Secure with tape as directed. 1. Dakin's wet-to-dry dressings 2. Aggressive offloading 3. Follow-up in 2 weeks Electronic Signature(s) Signed: 09/24/2022 10:29:51 AM By: Kalman Shan DO Entered By: Kalman Shan on 09/24/2022  10:09:47 -------------------------------------------------------------------------------- HxROS Details Patient Name: Date of Service: Joe [name], [place] RK A. 09/24/2022 8:45 A M Medical Record Number: [id number] Patient Account Number: [id number] Date of Birth/Sex: Treating RN: [birth year] (66 y.o. M) Primary Care Provider: Salena Saner Other Clinician: Referring Provider: Treating Provider/Extender: Ether Griffins in Treatment: 4 Constitutional Symptoms (General Health) Medical History: Past Medical History Notes: cognitive deficits congenital brain damage expressive speech delay Cardiovascular Medical History: Positive for: Coronary Artery Disease; Hypertension Past Medical History Notes: PVC LBBB thoracic ascending aortic aneurysm epicardial pacing lead placement Mulligan, Lindley A ([id number]) [id number] Page 6 of 7 Gastrointestinal Medical History: Past Medical History Notes: umbilical hernia Genitourinary Medical History: Past Medical History Notes: kidney stones Musculoskeletal Medical History: Positive for: Osteoarthritis - bilateral hips; left shoulder Immunizations Pneumococcal Vaccine: Received Pneumococcal Vaccination: No Implantable Devices No devices added Hospitalization / Surgery History Type of Hospitalization/Surgery cardioversions 07/2022 CABG 07/08/2022 hip replacements 2015 2018 left shoulder surgery epicardial pacing lead placement 07/08/2022 Family and Social History Heart Disease: Yes - Mother,Maternal Grandparents; Never smoker; Alcohol Use: Never; Drug Use: No History; Caffeine Use: Never; Financial Concerns: No; Food, Clothing or Shelter Needs: No; Support System Lacking: No; Transportation Concerns: No Electronic Signature(s) Signed: 09/24/2022 10:29:51 AM By: Kalman Shan DO Entered By: Kalman Shan on 09/24/2022  09:57:32 -------------------------------------------------------------------------------- SuperBill Details Patient Name: Date of Service: Joe Herald, [place] A. 09/24/2022 Medical Record Number: [id number] Patient Account Number: [id number] Date of Birth/Sex: Treating RN: 07/15/1956 (66 y.o. [place] Pop, [place] Primary Care Provider: Salena Saner Other Clinician: Referring Provider: Treating Provider/Extender: Ether Griffins in Treatment: 4 Diagnosis Coding ICD-10 Codes Code Description [phone] Pressure ulcer of sacral region, stage 4 I50.42 Chronic combined systolic (congestive) and diastolic (congestive) heart failure Z95.1 Presence of aortocoronary bypass graft Facility Procedures : CPT4 Code: [id number] Description: 99213 - WOUND CARE VISIT-LEV 3 EST PT Modifier: Quantity: 1 Physician Procedures : CPT4 Code Description [place], [place] A ([id number]) [id number] - WC PHYS LEVEL 3 - EST PT ICD-10 Diagnosis Description L89.154 Pressure ulcer of sacral region, stage 4 I50.42 Chronic combined systolic  (congestive) and diastolic (congestive) heart failure Z95.1 Presence of aortocoronary bypass graft Quantity: [id number] Page 7 of 7 1 Electronic Signature(s) Signed: 09/24/2022 10:29:51 AM By: Kalman Shan DO Entered By: Kalman Shan on 09/24/2022 10:09:59

## 2022-09-24 NOTE — Progress Notes (Signed)
Joe [name] ([id number]) [id number] Page 1 of 9 Visit Report for 09/24/2022 Arrival Information Details Patient Name: Date of Service: Joe [name], [place] [name]. 09/24/2022 8:45 [name] Medical Record Number: [id number] Patient Account Number: [id number] Date of Birth/Sex: Treating RN: 01/01/1956 (66 y.o. [name] Primary Care Toye Rouillard: Salena Saner Other Clinician: Referring Meygan Kyser: Treating Quintrell Baze/Extender: Ether Griffins in Treatment: 4 Visit Information History Since Last Visit Added or deleted any medications: No Patient Arrived: Ambulatory Any new allergies or adverse reactions: No Arrival Time: 09:03 Had [name] fall or experienced change in No Accompanied By: mother activities of daily living that may affect Transfer Assistance: None risk of falls: Patient Identification Verified: Yes Signs or symptoms of abuse/neglect since last visito No Secondary Verification Process Completed: Yes Hospitalized since last visit: No Patient Requires Transmission-Based Precautions: No Implantable device outside of the clinic excluding No Patient Has Alerts: Yes cellular tissue based products placed in the center Patient Alerts: Patient on Blood Thinner since last visit: Has Dressing in Place as Prescribed: Yes Pain Present Now: No Electronic Signature(s) Signed: 09/24/2022 4:55:27 PM By: Deon Pilling RN, BSN Entered By: Deon Pilling on 09/24/2022 09:03:56 -------------------------------------------------------------------------------- Clinic Level of Care Assessment Details Patient Name: Date of Service: Joe TI, MA [name]. 09/24/2022 8:45 [name] Medical Record Number: [id number] Patient Account Number: [id number] Date of Birth/Sex: Treating RN: 1956-05-12 (66 y.o. Burnadette Pop, Lauren Primary Care Krrish Freund: Salena Saner Other Clinician: Referring Raeya Merritts: Treating Angello Chien/Extender: Ether Griffins in Treatment: 4 Clinic Level of Care Assessment Items TOOL 4 Quantity Score X- 1 0 Use when only an EandM is performed on FOLLOW-UP visit ASSESSMENTS - Nursing Assessment / Reassessment X- 1 10 Reassessment of Co-morbidities (includes updates in patient status) X- 1 5 Reassessment of Adherence to Treatment Plan ASSESSMENTS - Wound and Skin [name] ssessment / Reassessment X - Simple Wound Assessment / Reassessment - one wound 1 5 '[]'$  - 0 Complex Wound Assessment / Reassessment - multiple wounds '[]'$  - 0 Dermatologic / Skin Assessment (not related to wound area) ASSESSMENTS - Focused Assessment '[]'$  - 0 Circumferential Edema Measurements - multi extremities '[]'$  - 0 Nutritional Assessment / Counseling / Intervention Joe [name] ([id number]) [id number] Page 2 of 9 '[]'$  - 0 Lower Extremity Assessment (monofilament, tuning fork, pulses) '[]'$  - 0 Peripheral Arterial Disease Assessment (using hand held doppler) ASSESSMENTS - Ostomy and/or Continence Assessment and Care '[]'$  - 0 Incontinence Assessment and Management '[]'$  - 0 Ostomy Care Assessment and Management (repouching, etc.) PROCESS - Coordination of Care '[]'$  - 0 Simple Patient / Family Education for ongoing care X- 1 20 Complex (extensive) Patient / Family Education for ongoing care X- 1 10 Staff obtains Programmer, systems, Records, T Results / Process Orders est X- 1 10 Staff telephones HHA, Nursing Homes / Clarify orders / etc '[]'$  - 0 Routine Transfer to another Facility (non-emergent condition) '[]'$  - 0 Routine Hospital Admission (non-emergent condition) '[]'$  - 0 New Admissions / Biomedical engineer / Ordering NPWT Apligraf, etc. , '[]'$  - 0 Emergency Hospital Admission (emergent condition) '[]'$  - 0 Simple Discharge Coordination X- 1 15 Complex (extensive) Discharge Coordination PROCESS - Special Needs '[]'$  - 0 Pediatric / Minor Patient Management '[]'$  - 0 Isolation Patient Management '[]'$  -  0 Hearing / Language / Visual special needs '[]'$  - 0 Assessment of Community assistance (transportation, D/C planning, etc.) '[]'$  - 0 Additional assistance / Altered mentation '[]'$  - 0 Support Surface(s) Assessment (bed, cushion,  seat, etc.) INTERVENTIONS - Wound Cleansing / Measurement X - Simple Wound Cleansing - one wound 1 5 '[]'$  - 0 Complex Wound Cleansing - multiple wounds X- 1 5 Wound Imaging (photographs - any number of wounds) '[]'$  - 0 Wound Tracing (instead of photographs) X- 1 5 Simple Wound Measurement - one wound '[]'$  - 0 Complex Wound Measurement - multiple wounds INTERVENTIONS - Wound Dressings X - Small Wound Dressing one or multiple wounds 1 10 '[]'$  - 0 Medium Wound Dressing one or multiple wounds '[]'$  - 0 Large Wound Dressing one or multiple wounds X- 1 5 Application of Medications - topical '[]'$  - 0 Application of Medications - injection INTERVENTIONS - Miscellaneous '[]'$  - 0 External ear exam '[]'$  - 0 Specimen Collection (cultures, biopsies, blood, body fluids, etc.) '[]'$  - 0 Specimen(s) / Culture(s) sent or taken to Lab for analysis '[]'$  - 0 Patient Transfer (multiple staff / Civil Service fast streamer / Similar devices) '[]'$  - 0 Simple Staple / Suture removal (25 or less) '[]'$  - 0 Complex Staple / Suture removal (26 or more) '[]'$  - 0 Hypo / Hyperglycemic Management (close monitor of Blood Glucose) Joe [name] ([id number]) [id number] Page 3 of 9 '[]'$  - 0 Ankle / Brachial Index (ABI) - do not check if billed separately X- 1 5 Vital Signs Has the patient been seen at the hospital within the last three years: Yes Total Score: 110 Level Of Care: New/Established - Level 3 Electronic Signature(s) Signed: 09/24/2022 5:16:09 PM By: Rhae Hammock RN Entered By: Rhae Hammock on 09/24/2022 09:35:54 -------------------------------------------------------------------------------- Encounter Discharge Information Details Patient Name: Date of Service: Joe [name],  [place] [name]. 09/24/2022 8:45 [name] Medical Record Number: [id number] Patient Account Number: [id number] Date of Birth/Sex: Treating RN: [birth year] (66 y.o. Burnadette Pop, Lauren Primary Care Taino Maertens: Salena Saner Other Clinician: Referring Hadli Vandemark: Treating Almira Phetteplace/Extender: Ether Griffins in Treatment: 4 Encounter Discharge Information Items Discharge Condition: Stable Ambulatory Status: Ambulatory Discharge Destination: Home Transportation: Private Auto Accompanied By: self Schedule Follow-up Appointment: Yes Clinical Summary of Care: Patient Declined Electronic Signature(s) Signed: 09/24/2022 5:16:09 PM By: Rhae Hammock RN Entered By: Rhae Hammock on 09/24/2022 09:36:31 -------------------------------------------------------------------------------- Lower Extremity Assessment Details Patient Name: Date of Service: Joe Herald, MA [name]. 09/24/2022 8:45 [name] Medical Record Number: [id number] Patient Account Number: [id number] Date of Birth/Sex: Treating RN: Mar 11, 1956 (66 y.o. [name] Primary Care Quint Chestnut: Salena Saner Other Clinician: Referring Ayanna Gheen: Treating Deidrick Rainey/Extender: Ether Griffins in Treatment: 4 Electronic Signature(s) Signed: 09/24/2022 4:55:27 PM By: Deon Pilling RN, BSN Entered By: Deon Pilling on 09/24/2022 09:09:03 -------------------------------------------------------------------------------- Multi Wound Chart Details Patient Name: Date of Service: Joe [name], Merlin Tipton [name]. 09/24/2022 8:45 [name] Medical Record Number: [id number] Patient Account Number: [id number] Obert, Chilton ([id number]) [id number] Page 4 of 9 Date of Birth/Sex: Treating RN: Jun 16, 1956 (66 y.o. M) Primary Care Jaxten Brosh: Other Clinician: Salena Saner Referring Danel Requena: Treating Rozetta Stumpp/Extender: Ether Griffins in Treatment: 4 Vital  Signs Height(in): 67 Pulse(bpm): 70 Weight(lbs): 160 Blood Pressure(mmHg): 80/50 Body Mass Index(BMI): 25.1 Temperature(F): 97.8 Respiratory Rate(breaths/min): 20 [1:Photos:] [N/[name]:N/[name]] Sacrum N/[name] N/[name] Wound Location: Pressure Injury N/[name] N/[name] Wounding Event: Pressure Ulcer N/[name] N/[name] Primary Etiology: Coronary Artery Disease, N/[name] N/[name] Comorbid History: Hypertension, Osteoarthritis 08/07/2022 N/[name] N/[name] Date Acquired: 4 N/[name] N/[name] Weeks of Treatment: Open N/[name] N/[name] Wound Status: No N/[name] N/[name] Wound Recurrence: 1.9x1.9x0.9 N/[name] N/[name] Measurements L x W x D (cm) 2.835 N/[name] N/[name] [name] (cm) :  rea 2.552 N/[name] N/[name] Volume (cm) : 83.60% N/[name] N/[name] % Reduction in [name] rea: 87.70% N/[name] N/[name] % Reduction in Volume: 10 Starting Position 1 (o'clock): 12 Ending Position 1 (o'clock): 5.5 Maximum Distance 1 (cm): Yes N/[name] N/[name] Undermining: Category/Stage IV N/[name] N/[name] Classification: Medium N/[name] N/[name] Exudate [name] mount: Serosanguineous N/[name] N/[name] Exudate Type: red, brown N/[name] N/[name] Exudate Color: Distinct, outline attached N/[name] N/[name] Wound Margin: Large (67-100%) N/[name] N/[name] Granulation [name] mount: Red, Pink N/[name] N/[name] Granulation Quality: None Present (0%) N/[name] N/[name] Necrotic [name] mount: Fat Layer (Subcutaneous Tissue): Yes N/[name] N/[name] Exposed Structures: Muscle: Yes Fascia: No Tendon: No Joint: No Bone: No Small (1-33%) N/[name] N/[name] Epithelialization: Excoriation: No N/[name] N/[name] Periwound Skin Texture: Induration: No Callus: No Crepitus: No Rash: No Scarring: No Maceration: No N/[name] N/[name] Periwound Skin Moisture: Dry/Scaly: No Atrophie Blanche: No N/[name] N/[name] Periwound Skin Color: Cyanosis: No Ecchymosis: No Erythema: No Hemosiderin Staining: No Mottled: No Pallor: No Rubor: No Yes N/[name] N/[name] Tenderness on Palpation: Treatment Notes Wound #1 (Sacrum) Cleanser Soap and Water Discharge Instruction: May shower and wash wound with dial antibacterial soap and water prior to dressing change. Hardman, Copeland [name] ([id number])  [id number] Page 5 of 9 Wound Cleanser Discharge Instruction: Cleanse the wound with wound cleanser prior to applying [name] clean dressing using gauze sponges, not tissue or cotton balls. Peri-Wound Care Skin Prep Discharge Instruction: Use skin prep as directed Topical Primary Dressing Dakin's Solution 0.25%, 16 (oz) Discharge Instruction: Moisten gauze with Dakin's solution Secondary Dressing ABD Pad, 5x9 Discharge Instruction: Apply over primary dressing as directed. Secured With 36M Medipore H Soft Cloth Surgical T ape, 4 x 10 (in/yd) Discharge Instruction: Secure with tape as directed. Compression Wrap Compression Stockings Add-Ons Electronic Signature(s) Signed: 09/24/2022 10:29:51 AM By: Kalman Shan DO Entered By: Kalman Shan on 09/24/2022 09:56:50 -------------------------------------------------------------------------------- Multi-Disciplinary Care Plan Details Patient Name: Date of Service: Joe Herald, MA [name]. 09/24/2022 8:45 [name] Medical Record Number: [id number] Patient Account Number: [id number] Date of Birth/Sex: Treating RN: 1956/10/10 (66 y.o. Burnadette Pop, Lauren Primary Care Vanity Larsson: Salena Saner Other Clinician: Referring Shanda Cadotte: Treating Cordae Mccarey/Extender: Ether Griffins in Treatment: 4 Active Inactive Wound/Skin Impairment Nursing Diagnoses: Impaired tissue integrity Knowledge deficit related to ulceration/compromised skin integrity Goals: Patient will have [name] decrease in wound volume by X% from date: (specify in notes) Date Initiated: 08/27/2022 Target Resolution Date: 09/18/2022 Goal Status: Active Patient/caregiver will verbalize understanding of skin care regimen Date Initiated: 08/27/2022 Target Resolution Date: 09/19/2022 Goal Status: Active Ulcer/skin breakdown will have [name] volume reduction of 30% by week 4 Date Initiated: 08/27/2022 Target Resolution Date: 09/18/2022 Goal  Status: Active Interventions: Assess patient/caregiver ability to obtain necessary supplies Assess patient/caregiver ability to perform ulcer/skin care regimen upon admission and as needed Assess ulceration(s) every visit Balch, Taliesin [name] ([id number]) [id number] Page 6 of 9 Notes: Electronic Signature(s) Signed: 09/24/2022 5:16:09 PM By: Rhae Hammock RN Entered By: Rhae Hammock on 09/24/2022 09:33:35 -------------------------------------------------------------------------------- Pain Assessment Details Patient Name: Date of Service: Joe [name], [place] [name]. 09/24/2022 8:45 [name] Medical Record Number: [id number] Patient Account Number: [id number] Date of Birth/Sex: Treating RN: 1956/02/15 (66 y.o. [name] Primary Care Marcelina Mclaurin: Salena Saner Other Clinician: Referring Olis Viverette: Treating Contessa Preuss/Extender: Ether Griffins in Treatment: 4 Active Problems Location of Pain Severity and Description of Pain Patient Has Paino No Site Locations Rate the pain. Current Pain Level: 0 Pain Management and Medication Current Pain Management: Medication: No Cold Application: No Rest:  No Massage: No Activity: No T.E.N.S.: No Heat Application: No Leg drop or elevation: No Is the Current Pain Management Adequate: Adequate How does your wound impact your activities of daily livingo Sleep: No Bathing: No Appetite: No Relationship With Others: No Bladder Continence: No Emotions: No Bowel Continence: No Work: No Toileting: No Drive: No Dressing: No Hobbies: No Electronic Signature(s) Signed: 09/24/2022 4:55:27 PM By: Deon Pilling RN, BSN Entered By: Deon Pilling on 09/24/2022 09:08:57 [place], Brooke [name] ([id number]) [id number] Page 7 of 9 -------------------------------------------------------------------------------- Patient/Caregiver Education Details Patient Name: Date of  Service: Joe [name], [place] [name]. 12/14/2023andnbsp8:45 [name] Medical Record Number: [id number] Patient Account Number: [id number] Date of Birth/Gender: Treating RN: 06-13-1956 (66 y.o. Erie Noe Primary Care Physician: Salena Saner Other Clinician: Referring Physician: Treating Physician/Extender: Ether Griffins in Treatment: 4 Education Assessment Education Provided To: Patient Education Topics Provided Wound/Skin Impairment: Methods: Explain/Verbal Responses: Reinforcements needed, State content correctly Electronic Signature(s) Signed: 09/24/2022 5:16:09 PM By: Rhae Hammock RN Entered By: Rhae Hammock on 09/24/2022 09:33:45 -------------------------------------------------------------------------------- Wound Assessment Details Patient Name: Date of Service: Joe [name], [place] [name]. 09/24/2022 8:45 [name] Medical Record Number: [id number] Patient Account Number: [id number] Date of Birth/Sex: Treating RN: 1956/08/09 (66 y.o. [name], Meta.Reding Primary Care Aydrian Halpin: Salena Saner Other Clinician: Referring Quanta Roher: Treating Chukwuemeka Artola/Extender: Christian Mate Weeks in Treatment: 4 Wound Status Wound Number: 1 Primary Etiology: Pressure Ulcer Wound Location: Sacrum Wound Status: Open Wounding Event: Pressure Injury Comorbid History: Coronary Artery Disease, Hypertension, Osteoarthritis Date Acquired: 08/07/2022 Weeks Of Treatment: 4 Clustered Wound: No Photos Wound Measurements Length: (cm) 1.9 Width: (cm) 1.9 Depth: (cm) 0.9 Area: (cm) 2.8 Volume: (cm) 2.5 [place], Caylon [name] ([id number]) % Reduction in Area: 83.6% % Reduction in Volume: 87.7% Epithelialization: Small (1-33%) 35 Tunneling: No 52 Undermining: Yes Starting Position (o'clock): 10 Ending Position (o'clock): 12 Maximum Distance: (cm) 5.5 [id number] Page 8 of 9 Wound Description Classification:  Category/Stage IV Wound Margin: Distinct, outline attached Exudate Amount: Medium Exudate Type: Serosanguineous Exudate Color: red, brown Foul Odor After Cleansing: No Slough/Fibrino No Wound Bed Granulation Amount: Large (67-100%) Exposed Structure Granulation Quality: Red, Pink Fascia Exposed: No Necrotic Amount: None Present (0%) Fat Layer (Subcutaneous Tissue) Exposed: Yes Tendon Exposed: No Muscle Exposed: Yes Necrosis of Muscle: Yes Joint Exposed: No Bone Exposed: No Periwound Skin Texture Texture Color No Abnormalities Noted: No No Abnormalities Noted: No Callus: No Atrophie Blanche: No Crepitus: No Cyanosis: No Excoriation: No Ecchymosis: No Induration: No Erythema: No Rash: No Hemosiderin Staining: No Scarring: No Mottled: No Pallor: No Moisture Rubor: No No Abnormalities Noted: No Dry / Scaly: No Temperature / Pain Maceration: No Tenderness on Palpation: Yes Treatment Notes Wound #1 (Sacrum) Cleanser Soap and Water Discharge Instruction: May shower and wash wound with dial antibacterial soap and water prior to dressing change. Wound Cleanser Discharge Instruction: Cleanse the wound with wound cleanser prior to applying [name] clean dressing using gauze sponges, not tissue or cotton balls. Peri-Wound Care Skin Prep Discharge Instruction: Use skin prep as directed Topical Primary Dressing Dakin's Solution 0.25%, 16 (oz) Discharge Instruction: Moisten gauze with Dakin's solution Secondary Dressing ABD Pad, 5x9 Discharge Instruction: Apply over primary dressing as directed. Secured With 51M Medipore H Soft Cloth Surgical T ape, 4 x 10 (in/yd) Discharge Instruction: Secure with tape as directed. Compression Wrap Compression Stockings Add-Ons Electronic Signature(s) Signed: 09/24/2022 4:55:27 PM By: Deon Pilling RN, BSN Entered By: Deon Pilling on 09/24/2022 09:20:42 [place], Daqwan [name] (  [id number]) [id number] Page 9 of  9 -------------------------------------------------------------------------------- Vitals Details Patient Name: Date of Service: Joe [name], [place] [name]. 09/24/2022 8:45 [name] Medical Record Number: [id number] Patient Account Number: [id number] Date of Birth/Sex: Treating RN: 1955-12-04 (66 y.o. [name] Primary Care Tayshaun Kroh: Salena Saner Other Clinician: Referring Clancey Welton: Treating Shacoria Latif/Extender: Ether Griffins in Treatment: 4 Vital Signs Time Taken: 09:05 Temperature (F): 97.8 Height (in): 67 Pulse (bpm): 65 Weight (lbs): 160 Respiratory Rate (breaths/min): 20 Body Mass Index (BMI): 25.1 Blood Pressure (mmHg): 80/50 Reference Range: 80 - 120 mg / dl Notes BP with machine 80/47. Manual 80/50. Per mother patient is taking in fluid with his medications. Explained to mother patient may need to be seen by ED, urgent care, or PCP related to low BP. Faylene Allerton and case manager made aware. Electronic Signature(s) Signed: 09/24/2022 4:55:27 PM By: Deon Pilling RN, BSN Entered By: Deon Pilling on 09/24/2022 09:08:47

## 2022-09-25 ENCOUNTER — Other Ambulatory Visit: Payer: Self-pay

## 2022-09-25 ENCOUNTER — Encounter (HOSPITAL_BASED_OUTPATIENT_CLINIC_OR_DEPARTMENT_OTHER): Payer: Self-pay

## 2022-09-25 ENCOUNTER — Encounter (HOSPITAL_COMMUNITY): Payer: Self-pay

## 2022-09-25 ENCOUNTER — Emergency Department (HOSPITAL_BASED_OUTPATIENT_CLINIC_OR_DEPARTMENT_OTHER): Payer: Medicare Other | Admitting: Radiology

## 2022-09-25 ENCOUNTER — Emergency Department (HOSPITAL_BASED_OUTPATIENT_CLINIC_OR_DEPARTMENT_OTHER): Payer: Medicare Other

## 2022-09-25 ENCOUNTER — Observation Stay (HOSPITAL_BASED_OUTPATIENT_CLINIC_OR_DEPARTMENT_OTHER)
Admission: EM | Admit: 2022-09-25 | Discharge: 2022-09-26 | Disposition: A | Discharge status: Home / Self Care | Payer: Medicare Other | Attending: Emergency Medicine | Admitting: Emergency Medicine

## 2022-09-25 DIAGNOSIS — I9589 Other hypotension: Secondary | ICD-10-CM | POA: Diagnosis not present

## 2022-09-25 DIAGNOSIS — I712 Thoracic aortic aneurysm, without rupture, unspecified: Secondary | ICD-10-CM | POA: Insufficient documentation

## 2022-09-25 DIAGNOSIS — Z7901 Long term (current) use of anticoagulants: Secondary | ICD-10-CM | POA: Insufficient documentation

## 2022-09-25 DIAGNOSIS — I5042 Chronic combined systolic (congestive) and diastolic (congestive) heart failure: Secondary | ICD-10-CM | POA: Insufficient documentation

## 2022-09-25 DIAGNOSIS — S31000D Unspecified open wound of lower back and pelvis without penetration into retroperitoneum, subsequent encounter: Secondary | ICD-10-CM

## 2022-09-25 DIAGNOSIS — Z951 Presence of aortocoronary bypass graft: Secondary | ICD-10-CM | POA: Diagnosis not present

## 2022-09-25 DIAGNOSIS — I2510 Atherosclerotic heart disease of native coronary artery without angina pectoris: Secondary | ICD-10-CM | POA: Diagnosis not present

## 2022-09-25 DIAGNOSIS — Z79899 Other long term (current) drug therapy: Secondary | ICD-10-CM | POA: Diagnosis not present

## 2022-09-25 DIAGNOSIS — R079 Chest pain, unspecified: Secondary | ICD-10-CM

## 2022-09-25 DIAGNOSIS — Z96643 Presence of artificial hip joint, bilateral: Secondary | ICD-10-CM | POA: Insufficient documentation

## 2022-09-25 DIAGNOSIS — I110 Hypertensive heart disease with heart failure: Secondary | ICD-10-CM | POA: Insufficient documentation

## 2022-09-25 DIAGNOSIS — R0789 Other chest pain: Principal | ICD-10-CM | POA: Insufficient documentation

## 2022-09-25 DIAGNOSIS — S31000A Unspecified open wound of lower back and pelvis without penetration into retroperitoneum, initial encounter: Secondary | ICD-10-CM | POA: Diagnosis present

## 2022-09-25 DIAGNOSIS — Z7982 Long term (current) use of aspirin: Secondary | ICD-10-CM | POA: Diagnosis not present

## 2022-09-25 DIAGNOSIS — I480 Paroxysmal atrial fibrillation: Secondary | ICD-10-CM | POA: Diagnosis not present

## 2022-09-25 DIAGNOSIS — E861 Hypovolemia: Secondary | ICD-10-CM

## 2022-09-25 DIAGNOSIS — L89159 Pressure ulcer of sacral region, unspecified stage: Secondary | ICD-10-CM | POA: Diagnosis not present

## 2022-09-25 LAB — BASIC METABOLIC PANEL
Anion gap: 10 (ref 5–15)
BUN: 27 mg/dL — ABNORMAL HIGH (ref 8–23)
CO2: 23 mmol/L (ref 22–32)
Calcium: 9.3 mg/dL (ref 8.9–10.3)
Chloride: 103 mmol/L (ref 98–111)
Creatinine, Ser: 0.9 mg/dL (ref 0.61–1.24)
GFR, Estimated: >60 mL/min (ref >60)
Glucose, Bld: 109 mg/dL — ABNORMAL HIGH (ref 70–99)
Potassium: 4.7 mmol/L (ref 3.5–5.1)
Sodium: 136 mmol/L (ref 135–145)

## 2022-09-25 LAB — HEPATIC FUNCTION PANEL
ALT: 16 U/L (ref 0–44)
AST: 15 U/L (ref 15–41)
Albumin: 3.7 g/dL (ref 3.5–5.0)
Alkaline Phosphatase: 71 U/L (ref 38–126)
Bilirubin, Direct: 0.2 mg/dL (ref 0.0–0.2)
Indirect Bilirubin: 0.2 mg/dL — ABNORMAL LOW (ref 0.3–0.9)
Total Bilirubin: 0.4 mg/dL (ref 0.3–1.2)
Total Protein: 6.7 g/dL (ref 6.5–8.1)

## 2022-09-25 LAB — CBC
HCT: 36.4 % — ABNORMAL LOW (ref 39.0–52.0)
Hemoglobin: 11.7 g/dL — ABNORMAL LOW (ref 13.0–17.0)
MCH: 30.9 pg (ref 26.0–34.0)
MCHC: 32.1 g/dL (ref 30.0–36.0)
MCV: 96 fL (ref 80.0–100.0)
Platelets: 318 10*3/uL (ref 150–400)
RBC: 3.79 MIL/uL — ABNORMAL LOW (ref 4.22–5.81)
RDW: 17.4 % — ABNORMAL HIGH (ref 11.5–15.5)
WBC: 7.3 10*3/uL (ref 4.0–10.5)
nRBC: 0 % (ref 0.0–0.2)

## 2022-09-25 LAB — BRAIN NATRIURETIC PEPTIDE: B Natriuretic Peptide: 241.8 pg/mL — ABNORMAL HIGH (ref 0.0–100.0)

## 2022-09-25 LAB — TROPONIN I (HIGH SENSITIVITY)
Troponin I (High Sensitivity): 17 ng/L (ref <18)
Troponin I (High Sensitivity): 15 ng/L (ref <18)

## 2022-09-25 LAB — LIPASE, BLOOD: Lipase: 35 U/L (ref 11–51)

## 2022-09-25 MED ORDER — IOHEXOL 350 MG/ML SOLN
100.0000 mL | Freq: Once | INTRAVENOUS | Status: AC | PRN
  Administered 2022-09-25: 100 mL via INTRAVENOUS

## 2022-09-25 MED ORDER — LIDOCAINE VISCOUS HCL 2 % MT SOLN
15.0000 mL | Freq: Once | OROMUCOSAL | Status: AC
  Administered 2022-09-25: 15 mL via ORAL
  Filled 2022-09-25: qty 15

## 2022-09-25 MED ORDER — ALUM & MAG HYDROXIDE-SIMETH 200-200-20 MG/5ML PO SUSP
30.0000 mL | Freq: Once | ORAL | Status: AC
  Administered 2022-09-25: 30 mL via ORAL
  Filled 2022-09-25: qty 30

## 2022-09-25 MED ORDER — SODIUM CHLORIDE 0.9 % IV BOLUS
250.0000 mL | Freq: Once | INTRAVENOUS | Status: AC
  Administered 2022-09-25: 250 mL via INTRAVENOUS

## 2022-09-25 NOTE — ED Triage Notes (Signed)
Pt presents to ED POV. Pt c/o CP that began while exerting. Pt pale in triage and reports feeling very weak. Pt had surgery for aortic aneurism 6w ago.

## 2022-09-25 NOTE — ED Provider Notes (Signed)
[facility] EMERGENCY DEPT Provider Note   CSN: [id number] Arrival date & time: 09/25/22  1346     History  Chief Complaint  Patient presents with   Chest Pain    Joe [name] is a 66 y.o. male. With past medical history of hypertension, intellectual disability, speech delay, LBBB, thoracic ascending aortic aneurysm s/p repair on 07/08/22 who presents to the emergency department with chest pain.  Describes having chest pain beginning this morning after standing up. He describes it as central chest tightness that is non radiating. It waxes and wanes. Nothing makes it better or worse. He feels mildly short of breath without palpitations. No recent cough or fever. No associated radiation to the back, arm or jaw. No diaphoresis or nausea/vomiting. He denies abdominal pain, numbness or tingling to extremities, syncope. The family at bedside states yesterday and today his blood pressure has been lower in the 80s.   Most recent cardiology visit 09/02/22. BP was soft then too around 92/64. Seen by CT surg on 11/20 post-operative doing well. Most recent echo on 08/19/22 with EF <20%.    Chest Pain Associated symptoms: shortness of breath   Associated symptoms: no palpitations        Home Medications Prior to Admission medications   Medication Sig Start Date End Date Taking? Authorizing Provider  acetaminophen (TYLENOL) 325 MG tablet Take 650 mg by mouth every 6 (six) hours as needed for mild pain.    [provider]  amiodarone (PACERONE) 200 MG tablet Take 1 tablet (200 mg total) by mouth daily. 08/13/22   Stehler, Pricilla Larsson, PA-C  apixaban (ELIQUIS) 5 MG TABS tablet Take 1 tablet (5 mg total) by mouth 2 (two) times daily. 08/13/22   Stehler, Pricilla Larsson, PA-C  aspirin EC 81 MG tablet Take 1 tablet (81 mg total) by mouth daily. Swallow whole. 08/13/22   Stehler, Pricilla Larsson, PA-C  atorvastatin (LIPITOR) 80 MG tablet Take 1 tablet (80 mg total) by mouth daily. 08/13/22    Stehler, Pricilla Larsson, PA-C  Cholecalciferol (VITAMIN D3) 250 MCG (10000 UT) capsule Take 10,000 Units by mouth daily.    [provider]  Coenzyme Q10 (CO Q-10) 100 MG CAPS Take 100 mg by mouth daily.    [provider]  fluticasone (FLONASE) 50 MCG/ACT nasal spray Place 2 sprays into both nostrils daily. Patient taking differently: Place 2 sprays into both nostrils daily as needed for allergies. 01/09/20   Minette Brine, FNP  furosemide (LASIX) 20 MG tablet Take 2 tablets (40 mg total) by mouth daily. 09/02/22   Lyda Jester M, PA-C  leptospermum manuka honey (MEDIHONEY) PSTE paste Apply 1 Application topically daily. 08/13/22   Stehler, Pricilla Larsson, PA-C  OVER THE COUNTER MEDICATION PRUNE JUICE/FLAXSEED    [provider]  potassium chloride SA (KLOR-CON M) 20 MEQ tablet Take 1 tablet (20 mEq total) by mouth as needed (Take one tab on the days you require lasix). 08/13/22   Stehler, Pricilla Larsson, PA-C  sacubitril-valsartan (ENTRESTO) 49-51 MG Take 1 tablet by mouth 2 (two) times daily. 08/13/22   Stehler, Pricilla Larsson, PA-C  spironolactone (ALDACTONE) 25 MG tablet Take 0.5 tablets (12.5 mg total) by mouth 2 (two) times daily. 09/02/22   Consuelo Pandy, PA-C  tamsulosin (FLOMAX) 0.4 MG CAPS capsule Take 0.4 mg by mouth 2 (two) times daily. 04/11/22   [provider]  traMADol (ULTRAM) 50 MG tablet Take 1 tablet (50 mg total) by mouth every 6 (six) hours as  needed for moderate pain. 09/02/22   Consuelo Pandy, PA-C      Allergies    Codeine and Levaquin [levofloxacin in d5w]    Review of Systems   Review of Systems  Respiratory:  Positive for shortness of breath.   Cardiovascular:  Positive for chest pain. Negative for palpitations and leg swelling.  Neurological:  Negative for syncope.  All other systems reviewed and are negative.   Physical Exam Updated Vital Signs BP (!) 80/60   Pulse (!) 59   Temp 98.1 F (36.7 C) (Oral)   Resp 18   Ht '5\' 7"'$   (1.702 m)   Wt 74.6 kg   SpO2 100%   BMI 25.76 kg/m  Physical Exam Vitals and nursing note reviewed.  Constitutional:      Appearance: Normal appearance. He is ill-appearing. He is not toxic-appearing or diaphoretic.     Comments: Tired appearing  HENT:     Head: Normocephalic and atraumatic.  Eyes:     General: No scleral icterus. Neck:     Vascular: No JVD.  Cardiovascular:     Rate and Rhythm: Regular rhythm. Bradycardia present.     Pulses:          Radial pulses are 1+ on the right side and 1+ on the left side.     Heart sounds: Normal heart sounds. No murmur heard. Pulmonary:     Effort: Tachypnea present. No respiratory distress.     Breath sounds: Normal breath sounds.  Chest:     Chest wall: No tenderness.  Abdominal:     General: Bowel sounds are normal.     Palpations: Abdomen is soft.  Musculoskeletal:     Cervical back: Normal range of motion.     Right lower leg: No edema.     Left lower leg: No edema.  Skin:    General: Skin is warm and dry.     Capillary Refill: Capillary refill takes less than 2 seconds.  Neurological:     General: No focal deficit present.     Mental Status: He is alert and oriented to person, place, and time.  Psychiatric:        Mood and Affect: Mood normal.        Behavior: Behavior normal.     ED Results / Procedures / Treatments   Labs (all labs ordered are listed, but only abnormal results are displayed) Labs Reviewed  BASIC METABOLIC PANEL - Abnormal; Notable for the following components:      Result Value   Glucose, Bld 109 (*)    BUN 27 (*)    All other components within normal limits  CBC - Abnormal; Notable for the following components:   RBC 3.79 (*)    Hemoglobin 11.7 (*)    HCT 36.4 (*)    RDW 17.4 (*)    All other components within normal limits  BRAIN NATRIURETIC PEPTIDE - Abnormal; Notable for the following components:   B Natriuretic Peptide 241.8 (*)    All other components within normal limits   HEPATIC FUNCTION PANEL - Abnormal; Notable for the following components:   Indirect Bilirubin 0.2 (*)    All other components within normal limits  LIPASE, BLOOD  TROPONIN I (HIGH SENSITIVITY)  TROPONIN I (HIGH SENSITIVITY)   EKG EKG Interpretation  Date/Time:  Friday September 25 2022 14:06:05 EST Ventricular Rate:  62 PR Interval:    QRS Duration: 190 QT Interval:  522 QTC Calculation: 529 R Axis:  97 Text Interpretation: Wide QRS rhythm Rightward axis Left bundle branch block  no significant change since Oct 2023 Confirmed by Sherwood Gambler [phone]) on 09/25/2022 2:08:26 PM  Radiology CT Angio Chest/Abd/Pel for Dissection W and/or W/WO  Result Date: 09/25/2022 CLINICAL DATA:  Chest pain history of aortic aneurysm surgery 6 weeks ago EXAM: CT ANGIOGRAPHY CHEST, ABDOMEN AND PELVIS TECHNIQUE: Non-contrast CT of the chest was initially obtained. Multidetector CT imaging through the chest, abdomen and pelvis was performed using the standard protocol during bolus administration of intravenous contrast. Multiplanar reconstructed images and MIPs were obtained and reviewed to evaluate the vascular anatomy. RADIATION DOSE REDUCTION: This exam was performed according to the departmental dose-optimization program which includes automated exposure control, adjustment of the mA and/or kV according to patient size and/or use of iterative reconstruction technique. CONTRAST:  168m OMNIPAQUE IOHEXOL 350 MG/ML SOLN COMPARISON:  CT chest 07/27/2022, CT abdomen pelvis 09/17/2020 FINDINGS: CTA CHEST FINDINGS Cardiovascular: Motion degradation and streak artifact limits the exam. Non contrasted images of the chest demonstrate no definitive acute intramural hematoma. Moderate aortic atherosclerosis. Negative for aortic dissection. Postsurgical changes of the aortic root and valve. Cardiomegaly. Cardiac pacing device with epicardial leads similar in position. No convincing pericardial effusion. Similar caliber  and appearance of the postoperative aortic root. Mediastinum/Nodes: Midline trachea. No thyroid mass. Decreased mediastinal gas. Negative for fluid collection. Esophagus within normal limits. Lungs/Pleura: No sizable pleural effusion. Patchy atelectasis at the bases. No visible pneumothorax. Resolution of previously noted heterogeneous ground-glass densities. Musculoskeletal: Sternotomy. No acute osseous abnormality. Bilateral shoulder replacement with artifact. Review of the MIP images confirms the above findings. CTA ABDOMEN AND PELVIS FINDINGS VASCULAR Aorta: Normal caliber aorta without aneurysm, dissection, vasculitis or significant stenosis. Celiac: Patent without evidence of aneurysm, dissection, vasculitis or significant stenosis. SMA: Patent without evidence of aneurysm, dissection, vasculitis or significant stenosis. Renals: Both renal arteries are patent without evidence of aneurysm, dissection, vasculitis, fibromuscular dysplasia or significant stenosis. IMA: Patent without evidence of aneurysm, dissection, vasculitis or significant stenosis. Inflow: Patent without evidence of aneurysm, dissection, vasculitis or significant stenosis. Review of the MIP images confirms the above findings. NON-VASCULAR Hepatobiliary: No focal liver abnormality is seen. No gallstones, gallbladder wall thickening, or biliary dilatation. Pancreas: Unremarkable. No pancreatic ductal dilatation or surrounding inflammatory changes. Spleen: Normal in size without focal abnormality. Adrenals/Urinary Tract: Adrenal glands are normal. Bilateral renal cysts, no imaging follow-up recommended. No hydronephrosis. Bladder is partially obscured Stomach/Bowel: Stomach is within normal limits. No evidence of bowel wall thickening, distention, or inflammatory changes. Lymphatic: No grossly enlarged lymph nodes. Reproductive: Largely obscured by artifact. Other: Negative for free air or significant pelvic effusion. Musculoskeletal: Bilateral  hip replacements. Degenerative changes of the lumbar spine. Posterior fusion hardware at L4-L5. Review of the MIP images confirms the above findings. IMPRESSION: 1. Negative for acute aortic dissection. Stable postoperative appearance of the aortic root and ascending aorta. Decreased mediastinal gas compared to prior CT. Cardiomegaly. 2. No CT evidence for acute intra-abdominal or pelvic abnormality. 3. Aortic atherosclerosis. Aortic Atherosclerosis (ICD10-I70.0). Electronically Signed   By: KDonavan FoilM.D.   On: 09/25/2022 16:28    Procedures Procedures   Medications Ordered in ED Medications  sodium chloride 0.9 % bolus 250 mL (0 mLs Intravenous Stopped 09/25/22 1554)  alum & mag hydroxide-simeth (MAALOX/MYLANTA) 200-200-20 MG/5ML suspension 30 mL (30 mLs Oral Given 09/25/22 1452)    And  lidocaine (XYLOCAINE) 2 % viscous mouth solution 15 mL (15 mLs Oral Given 09/25/22 1453)  iohexol (OMNIPAQUE) 350  MG/ML injection 100 mL (100 mLs Intravenous Contrast Given 09/25/22 1532)    ED Course/ Medical Decision Making/ A&P           HEART Score: 6                Medical Decision Making Amount and/or Complexity of Data Reviewed Labs: ordered. Radiology: ordered.  Risk OTC drugs. Prescription drug management. Decision regarding hospitalization.  Initial Impression and Ddx 66 year old male who appears older than stated age who presents to the emergency department with central chest pressure.  He is most recently postoperative from a thoracic aortic aneurysm repair in September.  He is ill-appearing, blood pressure is soft in the 80s.  He is mildly bradycardic and appears to have an irregular rhythm.  He does have a left bundle branch block which is not new.  There is no palpable abdominal mass on his physical exam.  Does not appear to be overtly fluid volume overloaded.  He subjectively tachypneic. Patient PMH that increases complexity of ED encounter: Thoracic AAA s/p repair, hypertension,  CAD Differential includes: Acute chest syndrome, stable angina, atypical angina, pulmonary embolism, pneumothorax, aortic dissection, pleural effusion, CHF, COPD, asthma, myocarditis, pericarditis, cardiac tamponade, chest wall pain   Will initially give him small 250 mL fluid bolus for his blood pressure given his EF is less than 20%.  Will reevaluate after that.  Will give him GI cocktail as he does point to epigastric abdominal pain.  Will also obtain lipase and LFTs along with his ACS workup.  Will place order for CT dissection study given recent significant history.  Interpretation of Diagnostics I independent reviewed and interpreted the labs as followed: Troponin x 2 negative, BNP 200s, lipase negative, basic labs negative  - I independently visualized the following imaging with scope of interpretation limited to determining acute life threatening conditions related to emergency care: CT angio dissection study, which revealed no acute findings  Patient Reassessment and Ultimate Disposition/Management On reassessment he had improvement in pain with his GI cocktail however the pain returned and is about a 6 out of 10.  He continues to describe it as a tightness.  He does have some intellectual disability and difficulty elaborating on his symptoms.  He does point to the substernal area and epigastric region.  EKG without ischemia or infarction, troponin x2 negative, doubt ACS at this time. Does not appear fluid volume overloaded on exam, no edema on CTA, BNP 200s which is lower than previous, doubt CHF exacerbation  Considered but doubt PE. Wells low risk so will defer d-dimer or PE study at this time. CTA negative for findings of pneumothorax, large pleural effusion or pneumonia. No recent illnesses and troponin negative so doubt pericarditis or myocarditis  Symptoms inconsistent with aortic dissection.  Dissection study is also negative for any acute findings in the postoperative  phase. HEART Score: 6    He does have a high heart score.  His symptoms are still concerning with angina.  He is bradycardic from baseline and his blood pressures have been soft.  Difficult to fluid resuscitate given his poor EF.  His blood pressure is too low to trial nitroglycerin.  Consulted and spoke with Dr. Percival Spanish, cardiology who is happy to consult if hospitalist feels this is appropriate.  Consulted and spoke with Dr. Roosevelt Locks, hospitalist who agrees to admit the patient.  Patient management required discussion with the following services or consulting groups:  Hospitalist Service and Cardiology  Complexity of Problems Addressed Acute complicated  illness or Injury  Additional Data Reviewed and Analyzed Further history obtained from: Further history from spouse/family member, Past medical history and medications listed in the EMR, Prior ED visit notes, Recent PCP notes, Care Everywhere, and Prior labs/imaging results  Patient Encounter Risk Assessment Prescriptions, SDOH impact on management, and Consideration of hospitalization  Final Clinical Impression(s) / ED Diagnoses Final diagnoses:  Chest pain, unspecified type    Rx / DC Orders ED Discharge Orders     None         Mickie Hillier, PA-C 09/25/22 Wonda Cheng, MD 09/27/22 [phone]

## 2022-09-25 NOTE — ED Notes (Signed)
Patient made aware that some Pain Medication and NTG will decrease the Patients BP. Patient Pain has decreased for now but EDP made aware of increase in Patients Pain level at this time.

## 2022-09-25 NOTE — Progress Notes (Addendum)
History of HFrEF with LVEF less than 20% and known CAD status post CABG and known RCA stenosis 60-70% presented with new onset of chest pain centrally located, CTA negative for PE or dissection blood pressure on the low side was given 250 mL bolus (given his reduced LVEF), troponins negative x 2 EKG showed persistent left bundle branch block.  ED thought the pressure could be angina versus reflux, GI cocktail given with no effect.  Cardiology consulted and recommend patient come to [facility] for further workup.  Cardiology is Dr. Percival Spanish.  Accepted to Tele observation.

## 2022-09-26 ENCOUNTER — Encounter (HOSPITAL_COMMUNITY): Payer: Self-pay | Admitting: Internal Medicine

## 2022-09-26 DIAGNOSIS — I9589 Other hypotension: Secondary | ICD-10-CM | POA: Diagnosis not present

## 2022-09-26 DIAGNOSIS — Z79899 Other long term (current) drug therapy: Secondary | ICD-10-CM | POA: Diagnosis not present

## 2022-09-26 DIAGNOSIS — I5042 Chronic combined systolic (congestive) and diastolic (congestive) heart failure: Secondary | ICD-10-CM | POA: Diagnosis not present

## 2022-09-26 DIAGNOSIS — R0789 Other chest pain: Secondary | ICD-10-CM | POA: Diagnosis present

## 2022-09-26 DIAGNOSIS — R079 Chest pain, unspecified: Secondary | ICD-10-CM | POA: Diagnosis not present

## 2022-09-26 DIAGNOSIS — Z7982 Long term (current) use of aspirin: Secondary | ICD-10-CM | POA: Diagnosis not present

## 2022-09-26 DIAGNOSIS — Z7901 Long term (current) use of anticoagulants: Secondary | ICD-10-CM | POA: Diagnosis not present

## 2022-09-26 DIAGNOSIS — Z951 Presence of aortocoronary bypass graft: Secondary | ICD-10-CM | POA: Diagnosis not present

## 2022-09-26 DIAGNOSIS — I110 Hypertensive heart disease with heart failure: Secondary | ICD-10-CM | POA: Diagnosis not present

## 2022-09-26 DIAGNOSIS — Z96643 Presence of artificial hip joint, bilateral: Secondary | ICD-10-CM | POA: Diagnosis not present

## 2022-09-26 DIAGNOSIS — I2510 Atherosclerotic heart disease of native coronary artery without angina pectoris: Secondary | ICD-10-CM | POA: Diagnosis not present

## 2022-09-26 DIAGNOSIS — I480 Paroxysmal atrial fibrillation: Secondary | ICD-10-CM | POA: Diagnosis not present

## 2022-09-26 DIAGNOSIS — I712 Thoracic aortic aneurysm, without rupture, unspecified: Secondary | ICD-10-CM | POA: Diagnosis not present

## 2022-09-26 DIAGNOSIS — L89159 Pressure ulcer of sacral region, unspecified stage: Secondary | ICD-10-CM | POA: Diagnosis not present

## 2022-09-26 MED ORDER — FUROSEMIDE 20 MG PO TABS: 40.0000 mg | ORAL_TABLET | Freq: Every day | ORAL | Status: DC | PRN

## 2022-09-26 MED ORDER — PANTOPRAZOLE SODIUM 40 MG PO TBEC: 40.0000 mg | DELAYED_RELEASE_TABLET | Freq: Every day | ORAL | 0 refills | Status: DC

## 2022-09-26 MED ORDER — ASPIRIN 81 MG PO TBEC
81.0000 mg | DELAYED_RELEASE_TABLET | Freq: Every day | ORAL | Status: DC
  Administered 2022-09-26: 81 mg via ORAL
  Filled 2022-09-26: qty 1

## 2022-09-26 MED ORDER — ACETAMINOPHEN 325 MG PO TABS: 650.0000 mg | ORAL_TABLET | ORAL | Status: DC | PRN

## 2022-09-26 MED ORDER — SPIRONOLACTONE 25 MG PO TABS: 12.5000 mg | ORAL_TABLET | Freq: Every day | ORAL | 0 refills | Status: DC

## 2022-09-26 MED ORDER — PANTOPRAZOLE SODIUM 20 MG PO TBEC
20.0000 mg | DELAYED_RELEASE_TABLET | Freq: Every day | ORAL | Status: DC
  Administered 2022-09-26: 20 mg via ORAL
  Filled 2022-09-26: qty 1

## 2022-09-26 MED ORDER — SACUBITRIL-VALSARTAN 24-26 MG PO TABS: 1.0000 | ORAL_TABLET | Freq: Two times a day (BID) | ORAL | Status: DC

## 2022-09-26 MED ORDER — AMIODARONE HCL 200 MG PO TABS
200.0000 mg | ORAL_TABLET | Freq: Every day | ORAL | Status: DC
  Administered 2022-09-26: 200 mg via ORAL
  Filled 2022-09-26: qty 1

## 2022-09-26 MED ORDER — SPIRONOLACTONE 12.5 MG HALF TABLET
12.5000 mg | ORAL_TABLET | Freq: Every day | ORAL | Status: DC
  Administered 2022-09-26: 12.5 mg via ORAL
  Filled 2022-09-26: qty 1

## 2022-09-26 MED ORDER — OXYCODONE HCL 5 MG PO TABS: 5.0000 mg | ORAL_TABLET | ORAL | Status: DC | PRN

## 2022-09-26 MED ORDER — ATORVASTATIN CALCIUM 80 MG PO TABS
80.0000 mg | ORAL_TABLET | Freq: Every day | ORAL | Status: DC
  Administered 2022-09-26: 80 mg via ORAL
  Filled 2022-09-26: qty 1

## 2022-09-26 MED ORDER — FUROSEMIDE 40 MG PO TABS: 40.0000 mg | ORAL_TABLET | Freq: Every day | ORAL | Status: DC

## 2022-09-26 MED ORDER — SACUBITRIL-VALSARTAN 24-26 MG PO TABS: 1.0000 | ORAL_TABLET | Freq: Two times a day (BID) | ORAL | 0 refills | Status: AC

## 2022-09-26 MED ORDER — TAMSULOSIN HCL 0.4 MG PO CAPS
0.4000 mg | ORAL_CAPSULE | Freq: Two times a day (BID) | ORAL | Status: DC
  Administered 2022-09-26: 0.4 mg via ORAL
  Filled 2022-09-26: qty 1

## 2022-09-26 MED ORDER — SPIRONOLACTONE 12.5 MG HALF TABLET: 12.5000 mg | ORAL_TABLET | Freq: Two times a day (BID) | ORAL | Status: DC

## 2022-09-26 MED ORDER — SACUBITRIL-VALSARTAN 49-51 MG PO TABS
1.0000 | ORAL_TABLET | Freq: Two times a day (BID) | ORAL | Status: DC
  Administered 2022-09-26: 1 via ORAL
  Filled 2022-09-26: qty 1

## 2022-09-26 MED ORDER — ALUM & MAG HYDROXIDE-SIMETH 200-200-20 MG/5ML PO SUSP: 15.0000 mL | ORAL | Status: DC | PRN

## 2022-09-26 MED ORDER — APIXABAN 5 MG PO TABS
5.0000 mg | ORAL_TABLET | Freq: Two times a day (BID) | ORAL | Status: DC
  Administered 2022-09-26: 5 mg via ORAL
  Filled 2022-09-26: qty 1

## 2022-09-26 NOTE — Discharge Summary (Addendum)
Physician Discharge Summary  Joe [name] PNT:[id number] DOB: 19-Nov-1955 DOA: 09/25/2022  PCP: Joe Blade, MD  Admit date: 09/25/2022 Discharge date: 09/26/2022  Time spent: 40 minutes  Recommendations for Outpatient Follow-up:  Follow outpatient CBC/CMP Follow blood pressure/volume status with entresto and spironolactone at reduced dose outpatient   Follow PPI and symptoms, consider referral to GI   Discharge Diagnoses:  Principal Problem:   Chest pain Active Problems:   Chronic combined systolic and diastolic heart failure (HCC)   Sacral wound   Paroxysmal Joe [name]-fib (HCC)   CAD (coronary artery disease)   Hypotension due to hypovolemia   Discharge Condition: stable  Diet recommendation: heart healthy  Filed Weights   09/25/22 1423 09/26/22 0423  Weight: 74.6 kg 66.4 kg    History of present illness:   Joe [name] is Joe [name] 66 y.o. 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   Patient's home health RN noted his blood pressure to be 80 systolic for the second day in Jaye Polidori row and strongly recommended that he seek evaluation to the ED for this.  Patient denied lightheadedness or near syncope but reported that he developed burning central chest discomfort when he woke from Chaim Gatley nap.  His discomfort was localized, waxing and waning without any alleviating or exacerbating factors, and not like any symptoms he has had previously.  Pain eventually resolved in the ED with GI cocktail before slowly returning again.   There has not been any recent leg swelling and patient's family notes that his weight has been down 4 pounds recently.   [facility] ED Course: Upon arrival to the ED, patient is found to be afebrile and saturating well on  room air with systolic blood pressure as low as 80.  EKG demonstrates chronic LBBB.  CTA chest/abdomen/pelvis is negative for aortic dissection notable for stable postoperative appearance of the aortic root and ascending aorta.   Patient was treated with 250 cc of IV fluids and GI cocktail in the ED.  He was transferred to [facility] for admission.  He was seen by cardiology at [facility].  They recommended reduction in spironolactone and entresto dosing.  Stable for discharge.  Will discharge with PPI given ? Improvement with GI cocktail.  Hospital Course:  Assessment and Plan: 1. Chest pain; CAD  - Patient with moderate proximal RCA stenosis on cath from Sept 2023 managed medically p/w chest pain that began at rest and resolved for Caryl Manas while after GI cocktail in ED  - Troponin is normal x2, there are no acute findings on CTA in ED, and EKG has chronic LBBB  - Suspect GI etiology given resolution with GI cocktail -> discharge with protonix - continue ASA and Lipitor    2. Hypotension d/t hypovolemia  - Pt's home health RN noted his SBP to be 80 2 days in Khayree Delellis row and encourage presentation to ED  - No evidence for bleeding or infection; no acute findings on CTA chest/abd/pelvis in ED  - BP improved today, will reduce entresto and spironolactone doses at discharge.  Continue lasix as prescribed (only as needed).  3. Chronic combined systolic & diastolic CHF - Appears compensated on admission  - reduce entresto and spironolactone dosing given hypotension.  Continue lasix.   4. PAF  - In  SR on admission  - Continue amiodarone and Eliquis     5. Sacral wound  - Follows at wound clinic, follow outpatient   6. LBBB - Planned for CRT once sacral wound heals    7. Intellectual Disability - noted     Procedures: none   Consultations: cardiology  Discharge Exam: Vitals:   09/26/22 0423 09/26/22 1834  BP: 97/61 (!) 95/59  Pulse: (!) 53   Resp: 17 20  Temp: 98.3 F (36.8 C)    SpO2: 97%    Symptoms improved Discussed with mother at bedside  General: No acute distress. Cardiovascular: Heart sounds show Chantille Navarrete regular rate, and rhythm.  Lungs: Clear to auscultation bilaterally  Abdomen: Soft, nontender, nondistended Neurological: Alert. Moves all extremities 4. Cranial nerves II through XII grossly intact. Extremities: No clubbing or cyanosis. No edema.   Discharge Instructions   Discharge Instructions     (HEART FAILURE PATIENTS) Call MD:  Anytime you have any of the following symptoms: 1) 3 pound weight gain in 24 hours or 5 pounds in 1 week 2) shortness of breath, with or without Madinah Quarry dry hacking cough 3) swelling in the hands, feet or stomach 4) if you have to sleep on extra pillows at night in order to breathe.   Complete by: As directed    Call MD for:  difficulty breathing, headache or visual disturbances   Complete by: As directed    Call MD for:  difficulty breathing, headache or visual disturbances   Complete by: As directed    Call MD for:  extreme fatigue   Complete by: As directed    Call MD for:  extreme fatigue   Complete by: As directed    Call MD for:  hives   Complete by: As directed    Call MD for:  hives   Complete by: As directed    Call MD for:  persistant dizziness or light-headedness   Complete by: As directed    Call MD for:  persistant dizziness or light-headedness   Complete by: As directed    Call MD for:  persistant nausea and vomiting   Complete by: As directed    Call MD for:  persistant nausea and vomiting   Complete by: As directed    Call MD for:  redness, tenderness, or signs of infection (pain, swelling, redness, odor or green/yellow discharge around incision site)   Complete by: As directed    Call MD for:  redness, tenderness, or signs of infection (pain, swelling, redness, odor or green/yellow discharge around incision site)   Complete by: As directed    Call MD for:  severe uncontrolled pain   Complete by: As  directed    Call MD for:  severe uncontrolled pain   Complete by: As directed    Call MD for:  temperature >100.4   Complete by: As directed    Call MD for:  temperature >100.4   Complete by: As directed    Diet - low sodium heart healthy   Complete by: As directed    Diet - low sodium heart healthy   Complete by: As directed    Discharge instructions   Complete by: As directed    You were seen after presenting to the hospital with chest pain.  Your workup was reassuring.  You had Judene Logue CT scan that did not show any concerning findings.  Your troponins (heart enzymes) were normal.  Given your low blood pressures, we'll decrease your entresto in  half.  We'll also change your spironolactone to once daily.  Continue your lasix at the current dose.  Follow your blood pressure with your PCP as an outpatient.  Follow up with cardiology as well.   Your symptoms improved with treatment of GI (gastrointestinal) etiologies.  We'll send you home with Taja Pentland prescription for protonix which we use to treat reflux or gastritis or esophagitis (inflammation of the stomach or esophagus).  Follow up with your PCP outpatient to determine how long you should continue this.   Return for new, recurrent, or worsening symptoms.  Please ask your PCP to request records from this hospitalization so they know what was done and what the next steps will be.   Discharge wound care:   Complete by: As directed    Continue wound care per outpatient wound care center   Increase activity slowly   Complete by: As directed    Increase activity slowly   Complete by: As directed       Allergies as of 09/26/2022       Reactions   Codeine Nausea Only   Levaquin [levofloxacin In D5w] Diarrhea        Medication List     STOP taking these medications    sacubitril-valsartan 49-51 MG Commonly known as: ENTRESTO Replaced by: sacubitril-valsartan 24-26 MG       TAKE these medications    acetaminophen 325 MG  tablet Commonly known as: TYLENOL Take 650 mg by mouth every 6 (six) hours as needed for mild pain.   amiodarone 200 MG tablet Commonly known as: PACERONE Take 1 tablet (200 mg total) by mouth daily.   aspirin EC 81 MG tablet Take 1 tablet (81 mg total) by mouth daily. Swallow whole.   atorvastatin 80 MG tablet Commonly known as: LIPITOR Take 1 tablet (80 mg total) by mouth daily.   Co Q-10 100 MG Caps Take 100 mg by mouth daily.   Eliquis 5 MG Tabs tablet Generic drug: apixaban Take 1 tablet (5 mg total) by mouth 2 (two) times daily.   fluticasone 50 MCG/ACT nasal spray Commonly known as: FLONASE Place 2 sprays into both nostrils daily. What changed:  when to take this reasons to take this   furosemide 20 MG tablet Commonly known as: LASIX Take 2 tablets (40 mg total) by mouth daily as needed for fluid.   leptospermum manuka honey Pste paste Apply 1 Application topically daily.   OVER THE COUNTER MEDICATION PRUNE JUICE/FLAXSEED   pantoprazole 40 MG tablet Commonly known as: PROTONIX Take 1 tablet (40 mg total) by mouth daily. Start taking on: September 27, 2022   potassium chloride SA 20 MEQ tablet Commonly known as: KLOR-CON M Take 1 tablet (20 mEq total) by mouth as needed (Take one tab on the days you require lasix).   sacubitril-valsartan 24-26 MG Commonly known as: ENTRESTO Take 1 tablet by mouth 2 (two) times daily. Replaces: sacubitril-valsartan 49-51 MG   spironolactone 25 MG tablet Commonly known as: ALDACTONE Take 0.5 tablets (12.5 mg total) by mouth daily. Start taking on: September 27, 2022 What changed: when to take this   tamsulosin 0.4 MG Caps capsule Commonly known as: FLOMAX Take 0.4 mg by mouth 2 (two) times daily.   traMADol 50 MG tablet Commonly known as: ULTRAM Take 1 tablet (50 mg total) by mouth every 6 (six) hours as needed for moderate pain.   Vitamin D3 250 MCG (10000 UT) capsule Take 10,000 Units by mouth daily.  Discharge Care Instructions  (From admission, onward)           Start     Ordered   09/26/22 0000  Discharge wound care:       Comments: Continue wound care per outpatient wound care center   09/26/22 1622           Allergies  Allergen Reactions   Codeine Nausea Only   Levaquin [Levofloxacin In D5w] Diarrhea      The results of significant diagnostics from this hospitalization (including imaging, microbiology, ancillary and laboratory) are listed below for reference.    Significant Diagnostic Studies: CT Angio Chest/Abd/Pel for Dissection W and/or W/WO  Result Date: 09/25/2022 CLINICAL DATA:  Chest pain history of aortic aneurysm surgery 6 weeks ago EXAM: CT ANGIOGRAPHY CHEST, ABDOMEN AND PELVIS TECHNIQUE: Non-contrast CT of the chest was initially obtained. Multidetector CT imaging through the chest, abdomen and pelvis was performed using the standard protocol during bolus administration of intravenous contrast. Multiplanar reconstructed images and MIPs were obtained and reviewed to evaluate the vascular anatomy. RADIATION DOSE REDUCTION: This exam was performed according to the departmental dose-optimization program which includes automated exposure control, adjustment of the mA and/or kV according to patient size and/or use of iterative reconstruction technique. CONTRAST:  154m OMNIPAQUE IOHEXOL 350 MG/ML SOLN COMPARISON:  CT chest 07/27/2022, CT abdomen pelvis 09/17/2020 FINDINGS: CTA CHEST FINDINGS Cardiovascular: Motion degradation and streak artifact limits the exam. Non contrasted images of the chest demonstrate no definitive acute intramural hematoma. Moderate aortic atherosclerosis. Negative for aortic dissection. Postsurgical changes of the aortic root and valve. Cardiomegaly. Cardiac pacing device with epicardial leads similar in position. No convincing pericardial effusion. Similar caliber and appearance of the postoperative aortic root. Mediastinum/Nodes:  Midline trachea. No thyroid mass. Decreased mediastinal gas. Negative for fluid collection. Esophagus within normal limits. Lungs/Pleura: No sizable pleural effusion. Patchy atelectasis at the bases. No visible pneumothorax. Resolution of previously noted heterogeneous ground-glass densities. Musculoskeletal: Sternotomy. No acute osseous abnormality. Bilateral shoulder replacement with artifact. Review of the MIP images confirms the above findings. CTA ABDOMEN AND PELVIS FINDINGS VASCULAR Aorta: Normal caliber aorta without aneurysm, dissection, vasculitis or significant stenosis. Celiac: Patent without evidence of aneurysm, dissection, vasculitis or significant stenosis. SMA: Patent without evidence of aneurysm, dissection, vasculitis or significant stenosis. Renals: Both renal arteries are patent without evidence of aneurysm, dissection, vasculitis, fibromuscular dysplasia or significant stenosis. IMA: Patent without evidence of aneurysm, dissection, vasculitis or significant stenosis. Inflow: Patent without evidence of aneurysm, dissection, vasculitis or significant stenosis. Review of the MIP images confirms the above findings. NON-VASCULAR Hepatobiliary: No focal liver abnormality is seen. No gallstones, gallbladder wall thickening, or biliary dilatation. Pancreas: Unremarkable. No pancreatic ductal dilatation or surrounding inflammatory changes. Spleen: Normal in size without focal abnormality. Adrenals/Urinary Tract: Adrenal glands are normal. Bilateral renal cysts, no imaging follow-up recommended. No hydronephrosis. Bladder is partially obscured Stomach/Bowel: Stomach is within normal limits. No evidence of bowel wall thickening, distention, or inflammatory changes. Lymphatic: No grossly enlarged lymph nodes. Reproductive: Largely obscured by artifact. Other: Negative for free air or significant pelvic effusion. Musculoskeletal: Bilateral hip replacements. Degenerative changes of the lumbar spine. Posterior  fusion hardware at L4-L5. Review of the MIP images confirms the above findings. IMPRESSION: 1. Negative for acute aortic dissection. Stable postoperative appearance of the aortic root and ascending aorta. Decreased mediastinal gas compared to prior CT. Cardiomegaly. 2. No CT evidence for acute intra-abdominal or pelvic abnormality. 3. Aortic atherosclerosis. Aortic Atherosclerosis (ICD10-I70.0). Electronically Signed   By:  Donavan Foil M.D.   On: 09/25/2022 16:28    Microbiology: No results found for this or any previous visit (from the past 240 hour(s)).   Labs: Basic Metabolic Panel: Recent Labs  Lab 09/25/22 1420  NA 136  K 4.7  CL 103  CO2 23  GLUCOSE 109*  BUN 27*  CREATININE 0.90  CALCIUM 9.3   Liver Function Tests: Recent Labs  Lab 09/25/22 1420  AST 15  ALT 16  ALKPHOS 71  BILITOT 0.4  PROT 6.7  ALBUMIN 3.7   Recent Labs  Lab 09/25/22 1420  LIPASE 35   No results for input(s): "AMMONIA" in the last 168 hours. CBC: Recent Labs  Lab 09/25/22 1420  WBC 7.3  HGB 11.7*  HCT 36.4*  MCV 96.0  PLT 318   Cardiac Enzymes: No results for input(s): "CKTOTAL", "CKMB", "CKMBINDEX", "TROPONINI" in the last 168 hours. BNP: BNP (last 3 results) Recent Labs    06/25/22 1829 09/02/22 1020 09/25/22 1420  BNP 436.2* 473.8* 241.8*    ProBNP (last 3 results) No results for input(s): "PROBNP" in the last 8760 hours.  CBG: No results for input(s): "GLUCAP" in the last 168 hours.     Signed:  Fayrene Helper MD.  [facility] 09/26/2022, 7:18 PM

## 2022-09-26 NOTE — Consult Note (Addendum)
Cardiology Consultation   Patient ID: [name] MRN: [id number]; DOB: February 04, 1956  Admit date: 09/25/2022 Date of Consult: 09/26/2022  PCP:  Joe Blade, MD   [facility] Cardiologist:  Joe Latch, MD   Patient Profile:   Joe [name] is a 66 y.o. male with a hx of chronic combined systolic and diastolic heart failure, CAD, s/p Bentall with CABGx1 in 06/2022, chronic LBBB, PAF on Eliquis, cognitive deficits who is being seen 09/26/2022 for the evaluation of CHF and chest pain at the request of Joe [name].  History of Present Illness:   Joe [name] is a 66 year old male with above medical history who is followed by the Joe [name].   Per chart review, patient was referred to Joe [name] in 05/2022 for evaluation of abnormal EKG. Patient had recently established care with a PCP in 05/2022, and an EKG showed LBBB and first degree HB. He wore a 3 day Zio monitor on 05/2022 that showed predominantly sinus rhythm with a 3.7% PVC burden. Also showed 21 episodes os SVT lasting up to 16 seconds. Had an echocardiogram on 06/25/22 that showed EF 25-30%, severe dilation of the LV, grade I diastolic dysfunction, normal RV systolic function, severely dilated LA, severe aortic regurgitation, severe dilation of the aortic root measuring 65 mm. Patient was directly admitted to the hospital. Underwent coronary CTA on 06/26/2022 that showed that the ascending aorta measured 67 mm at sinus of Valsava, ascending aorta measured 56m. Also showed a coronary calcium score of 693 with concern for 70-99% stenosis in the mLAD and dRCA.   Underwent a R/L heart cath on 06/29/22 that showed moderate proximal RCA stenosis, patent left main, LAD, and left circumflex with mild luminal irregularities, and low left and right heart filling pressures. He was seen by TCTS, and plans were made for an Aortic Root replacement +/- AVR/CABG. He was also seen by EP, who planned to  do CRT-D after surgery.  Patient underwent Bentall procedure and CABGx1 (SVG-RCA) on 9/27. Intraoperative TEE showed EF 20-30%, normal functioning bioprosthetic AoV without AI or perivalvular lead post repair. He developed intermittent atrial fibrillation post-op. Treated with amiodarone, eventually underwent DCCV on 10/12. Converted back to afib on 10/16. Spontaneously back in NSR on 10/18. Discharge 11/2. Patient was seen by EP on 11/17, given sacral wound, decision was made to defer CRT-D implant until healed. Seen by advanced heart failure on 11/22. Was treated with spironolactone, entresto, lasix. Not on GG or digoxin given junctional rhythm, Not on SGLT2i given history of urinary retention and UTI.   [facility] of chest pain that began while he was exerting himself. Initial vital signs in the ED showed HR 63 BPM, BP 86/62, respiratory rate 22. Labs showed Na 136, K 4.7, creatinine 0.90, WBC 7.3, hemoglobin 11.7, platelets 318. hsTn 17>15. BNP elevated to 241.8 (note, this is down from 473.8 on 11/22).   CTA chest/abd/pelvis was negative for acute aortic dissection. EKG showed normal sinus rhythm, LBBB present. [facility] and cardiology asked to consult for CHF, chest pain  Interview conducted with patient and his brother in the room. Patient reports that today, his sacral wound hurts but he is not in any pain otherwise. Breathing is normal, no chest pain, no ankle edema. Brother reports that the patient had been having issues with constipation and indigestion recently. Patient had not been complaining of chest pain  until yesterday. Patient lives with his mother-- also interviewed her over the phone. Per her report, the patient has not been complaining of chest pain recently. He is still fairly active around the house and walks about 1 mile each day. Exercise tolerance is unchanged. He has not had sob or DOE. At home,  his BP has been low, often in the 61W systolic.      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   Murmur 05/26/2022   On mechanically assisted ventilation (HCC)    Osteoarthritis of left hip 01/16/2014   Osteoarthritis of right hip 05/15/2014   Primary localized osteoarthrosis of left shoulder 09/21/2017   PVC (premature ventricular contraction) 05/26/2022   Right ureteral stone    Speech impediment    Thoracic ascending aortic aneurysm ([place])    Umbilical hernia     Past Surgical History:  Procedure Laterality Date   ASCENDING AORTIC ROOT REPLACEMENT N/A 07/08/2022   Procedure: BENTALL PROCEDURE USING RESILIA KONECT 29MM CONDUIT;  Surgeon: Coralie Common, MD;  Location: [facility] [facility];  Service: Open Heart Surgery;  Laterality: N/A;   BACK SURGERY     CARDIOVERSION N/A 07/23/2022   Procedure: CARDIOVERSION;  Surgeon: Jolaine Artist, MD;  Location: [facility] ENDOSCOPY;  Service: Cardiovascular;  Laterality: N/A;   CORONARY ARTERY BYPASS GRAFT N/A 07/08/2022   Procedure: CORONARY ARTERY BYPASS GRAFTING (CABG) X 1, USING ENDOSCOPICALLY HARVESTED RIGHT LEG GREATER SAPHENOUS VEIN;  Surgeon: Coralie Common, MD;  Location: [facility];  Service: Open Heart Surgery;  Laterality: N/A;   CYSTOSCOPY W/ URETERAL STENT REMOVAL Right 04/19/2013   Procedure: CYSTOSCOPY WITH STENT REMOVAL;  Surgeon: Molli Hazard, MD;  Location: [facility];  Service: Urology;  Laterality: Right;   CYSTOSCOPY WITH RETROGRADE PYELOGRAM, URETEROSCOPY AND STENT PLACEMENT Right 04/19/2013   Procedure: CYSTOSCOPY WITH RETROGRADE  PYELOGRAM, URETEROSCOPY ;  Surgeon: Molli Hazard, MD;  Location: [facility];  Service: Urology;  Laterality: Right;   CYSTOSCOPY WITH STENT PLACEMENT Right 03/24/2013   Procedure: CYSTOSCOPY WITH STENT PLACEMENT;  Surgeon: Hanley Ben, MD;  Location: [facility];  Service: Urology;  Laterality: Right;   EPICARDIAL PACING LEAD PLACEMENT N/A 07/08/2022   Procedure: EPICARDIAL PACING LEAD PLACEMENT;  Surgeon: Coralie Common, MD;  Location: [place];  Service: Thoracic;  Laterality: N/A;   HOLMIUM LASER APPLICATION Right 01/13/1539   Procedure: HOLMIUM LASER APPLICATION;  Surgeon: Molli Hazard, MD;  Location: [facility];  Service: Urology;  Laterality: Right;   INGUINAL HERNIA REPAIR Left 03-04-2005   POSTERIOR LUMBAR FUSION  08-27-2011   L4 -- L5   RIGHT HEART CATH AND CORONARY ANGIOGRAPHY N/A 06/29/2022   Procedure: RIGHT HEART CATH AND CORONARY ANGIOGRAPHY;  Surgeon: Sherren Mocha, MD;  Location: [place] [facility];  Service: Cardiovascular;  Laterality: N/A;   RIGHT SHOULDER HEMIARTHROPLASTY  08-26-2010   OA   SHOULDER HEMI-ARTHROPLASTY Left 09/21/2017   Procedure: SHOULDER HEMI-ARTHROPLASTY;  Surgeon: Marchia Bond, MD;  Location: [place];  Service: Orthopedics;  Laterality: Left;   TEE WITHOUT CARDIOVERSION N/A 07/08/2022   Procedure: TRANSESOPHAGEAL ECHOCARDIOGRAM (TEE);  Surgeon: Coralie Common, MD;  Location: [place];  Service: Open Heart Surgery;  Laterality: N/A;   TEE WITHOUT CARDIOVERSION N/A 07/23/2022   Procedure: TRANSESOPHAGEAL ECHOCARDIOGRAM (TEE);  Surgeon: Jolaine Artist, MD;  Location: [place];  Service: Cardiovascular;  Laterality: N/A;   TONSILLECTOMY  AS CHILD   TOTAL HIP ARTHROPLASTY Left 01/16/2014   Procedure: TOTAL HIP ARTHROPLASTY;  Surgeon: Johnny Bridge, MD;  Location: [place];  Service: Orthopedics;  Laterality: Left;   TOTAL HIP ARTHROPLASTY Right 05/15/2014   Procedure: RIGHT TOTAL HIP ARTHROPLASTY;  Surgeon: Johnny Bridge, MD;  Location: [place];  Service: Orthopedics;  Laterality: Right;   TOTAL SHOULDER ARTHROPLASTY Right 2011     Home Medications:  Prior to Admission medications   Medication Sig Start Date End Date Taking? Authorizing Provider  acetaminophen (TYLENOL) 325 MG tablet Take 650 mg by mouth every 6 (six) hours as needed for mild pain.    [provider]  amiodarone (PACERONE) 200 MG tablet Take 1 tablet (200 mg total) by mouth daily. 08/13/22   Stehler, Pricilla Larsson, PA-C  apixaban (ELIQUIS) 5 MG TABS tablet Take 1 tablet (5 mg total) by mouth 2 (two) times daily. 08/13/22   Stehler, Pricilla Larsson, PA-C  aspirin EC 81 MG tablet Take 1 tablet (81 mg total) by mouth daily. Swallow whole. 08/13/22   Stehler, Pricilla Larsson, PA-C  atorvastatin (LIPITOR) 80 MG tablet Take 1 tablet (80 mg total) by mouth daily. 08/13/22   Stehler, Pricilla Larsson, PA-C  Cholecalciferol (VITAMIN D3) 250 MCG (10000 UT) capsule Take 10,000 Units by mouth daily.    [provider]  Coenzyme Q10 (CO Q-10) 100 MG CAPS Take 100 mg by mouth daily.    [provider]  fluticasone (FLONASE) 50 MCG/ACT nasal spray Place 2 sprays into both nostrils daily. Patient taking differently: Place 2 sprays into both nostrils daily as needed for allergies. 01/09/20   Minette Brine, FNP  furosemide (LASIX) 20 MG tablet Take 2 tablets (40 mg total) by mouth daily. 09/02/22   Lyda Jester M, PA-C  leptospermum manuka honey (MEDIHONEY) PSTE paste Apply 1 Application topically daily. 08/13/22   Stehler, Pricilla Larsson, PA-C  OVER THE COUNTER MEDICATION PRUNE JUICE/FLAXSEED    [provider]  potassium chloride SA (KLOR-CON M) 20 MEQ tablet Take 1 tablet (20 mEq total) by mouth as needed (Take one tab on the days you require lasix). 08/13/22   Stehler, Pricilla Larsson, PA-C  sacubitril-valsartan (ENTRESTO) 49-51 MG Take 1 tablet by mouth 2 (two) times daily. 08/13/22   Stehler, Pricilla Larsson, PA-C  spironolactone (ALDACTONE) 25 MG tablet Take 0.5  tablets (12.5 mg total) by mouth 2 (two) times daily. 09/02/22   Consuelo Pandy, PA-C  tamsulosin (FLOMAX) 0.4 MG CAPS capsule Take 0.4 mg by mouth 2 (two) times daily. 04/11/22   [provider]  traMADol (ULTRAM) 50 MG tablet Take 1 tablet (50 mg total) by mouth every 6 (six) hours as needed for moderate pain. 09/02/22   Consuelo Pandy, PA-C    Inpatient Medications: Scheduled Meds:  amiodarone  200 mg Oral Daily   apixaban  5 mg Oral BID   aspirin EC  81 mg Oral Daily   atorvastatin  80 mg Oral Daily   pantoprazole  20 mg Oral Daily   sacubitril-valsartan  1 tablet Oral BID   tamsulosin  0.4 mg Oral BID   Continuous Infusions:  PRN Meds: acetaminophen, alum & mag hydroxide-simeth, oxyCODONE  Allergies:    Allergies  Allergen Reactions   Codeine Nausea Only   Levaquin [Levofloxacin In D5w] Diarrhea    Social History:   Social History   Socioeconomic History   Marital status: Single    Spouse name: Not on file   Number of children: Not on file   Years of education: Not on file   Highest education level: Not on file  Occupational History   Occupation: unemployed  Tobacco Use   Smoking status: Never   Smokeless tobacco: Never  Vaping Use   Vaping Use: Never used  Substance and Sexual Activity   Alcohol use: No   Drug use: No   Sexual activity: Not Currently  Other Topics Concern   Not on file  Social History Narrative   Pt with delayed mental capabilities, 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  Lives with parents.   Social Determinants of Health   Financial Resource Strain: Low Risk  (10/11/2019)   Overall Financial Resource Strain (CARDIA)    Difficulty of Paying Living Expenses: Not hard at all  Food Insecurity: No Food Insecurity (09/26/2022)   Hunger Vital Sign    Worried About Running Out of Food in the Last Year: Never true    Ran Out of  Food in the Last Year: Never true  Transportation Needs: No Transportation Needs (09/26/2022)   PRAPARE - Hydrologist (Medical): No    Lack of Transportation (Non-Medical): No  Physical Activity: Sufficiently Active (10/11/2019)   Exercise Vital Sign    Days of Exercise per Week: 5 days    Minutes of Exercise per Session: 40 min  Stress: Stress Concern Present (10/11/2019)   [place]    Feeling of Stress : To some extent  Social Connections: Not on file  Intimate Partner Violence: Not At Risk (09/26/2022)   Humiliation, Afraid, Rape, and Kick questionnaire    Fear of Current or Ex-Partner: No    Emotionally Abused: No    Physically Abused: No    Sexually Abused: No    Family History:    Family History  Problem Relation Age of Onset   Heart failure Mother    Atrial fibrillation Mother    Heart failure Maternal Grandmother    Atrial fibrillation Maternal Grandmother      ROS:  Please see the history of present illness.   All other ROS reviewed and negative.     Physical Exam/Data:   Vitals:   09/25/22 2339 09/26/22 0000 09/26/22 0122 09/26/22 0423  BP:  100/67 107/71 97/61  Pulse:  (!) 48  (!) 53  Resp:  '13 17 17  '$ Temp: 98 F (36.7 C)  97.6 F (36.4 C) 98.3 F (36.8 C)  TempSrc: Oral  Oral Oral  SpO2:  93% 96% 97%  Weight:    66.4 kg  Height:        Intake/Output Summary (Last 24 hours) at 09/26/2022 1330 Last data filed at 09/25/2022 1554 Gross per 24 hour  Intake 250 ml  Output --  Net 250 ml      09/26/2022    4:23 AM 09/25/2022    2:23 PM 09/02/2022    9:33 AM  Last 3 Weights  Weight (lbs) 146 lb 6.2 oz 164 lb 7.4 oz 164 lb 6.4 oz  Weight (kg) 66.4 kg 74.6 kg 74.571 kg     Body mass index is 22.93 kg/m.  General:  Well nourished, well developed, in no acute distress. Sitting comfortably on the side of the bed  HEENT: normal Neck: no JVD Vascular:  Radial pulses 2+ bilaterally Cardiac:  normal S1, S2; RRR;  Lungs:  clear to auscultation bilaterally, no wheezing, rhonchi or rales. Normal WOB on room air  Abd: soft, nontender, no hepatomegaly  Ext: no edema in BLE Musculoskeletal:  No deformities, BUE and BLE strength normal and equal Skin: warm and dry  Neuro:  CNs 2-12 intact, no focal abnormalities noted Psych:  Normal affect   EKG:  The EKG was personally reviewed and demonstrates:   Telemetry:  Telemetry was personally reviewed and demonstrates:  Normal sinus rhythm, HR in the 50s-60s   Relevant CV Studies:  Echocardiogram 08/19/22 1. Findings similar to previous 08/03/22.   2. Left ventricular ejection fraction, by estimation, is <20%. The left  ventricle has severely decreased function. The left ventricle demonstrates  global hypokinesis. The left ventricular internal cavity size was severely  dilated. There is mild  concentric left ventricular hypertrophy. Left ventricular diastolic  parameters are consistent with Grade I diastolic dysfunction (impaired  relaxation).   3. Right ventricular systolic function is mildly reduced. The right  ventricular size is mildly enlarged. There is normal pulmonary artery  systolic pressure.   4. Left atrial size was severely dilated.   5. Right atrial size was mildly dilated.   6. The mitral valve is normal in structure. Mild mitral valve  regurgitation. No evidence of mitral stenosis.   7. The aortic valve has been repaired/replaced. Aortic valve  regurgitation is not visualized. No aortic stenosis is present. There is a  29 mm Resilia Konect Conduit Valve valve present in the aortic position.  Procedure Date: 07/08/22. Echo findings are  consistent with normal structure and function of the aortic valve  prosthesis.   8. Aortic root/ascending aorta has been repaired/replaced and dilatation  noted. There is mild dilatation of the aortic root, measuring 45 mm.   Laboratory  Data:  High Sensitivity Troponin:   Recent Labs  Lab 09/25/22 1420 09/25/22 1602  TROPONINIHS 17 15     Chemistry Recent Labs  Lab 09/25/22 1420  NA 136  K 4.7  CL 103  CO2 23  GLUCOSE 109*  BUN 27*  CREATININE 0.90  CALCIUM 9.3  GFRNONAA >60  ANIONGAP 10    Recent Labs  Lab 09/25/22 1420  PROT 6.7  ALBUMIN 3.7  AST 15  ALT 16  ALKPHOS 71  BILITOT 0.4   Lipids No results for input(s): "CHOL", "TRIG", "HDL", "LABVLDL", "LDLCALC", "CHOLHDL" in the last 168 hours.  Hematology Recent Labs  Lab 09/25/22 1420  WBC 7.3  RBC 3.79*  HGB 11.7*  HCT 36.4*  MCV 96.0  MCH 30.9  MCHC 32.1  RDW 17.4*  PLT 318   Thyroid No results for input(s): "TSH", "FREET4" in the last 168 hours.  BNP Recent Labs  Lab 09/25/22 1420  BNP 241.8*    DDimer No results for input(s): "DDIMER" in the last 168 hours.   Radiology/Studies:  CT Angio Chest/Abd/Pel for Dissection W and/or W/WO  Result Date: 09/25/2022 CLINICAL DATA:  Chest pain history of aortic aneurysm surgery 6 weeks ago EXAM: CT ANGIOGRAPHY CHEST, ABDOMEN AND PELVIS TECHNIQUE: Non-contrast CT of the chest  was initially obtained. Multidetector CT imaging through the chest, abdomen and pelvis was performed using the standard protocol during bolus administration of intravenous contrast. Multiplanar reconstructed images and MIPs were obtained and reviewed to evaluate the vascular anatomy. RADIATION DOSE REDUCTION: This exam was performed according to the departmental dose-optimization program which includes automated exposure control, adjustment of the mA and/or kV according to patient size and/or use of iterative reconstruction technique. CONTRAST:  171m OMNIPAQUE IOHEXOL 350 MG/ML SOLN COMPARISON:  CT chest 07/27/2022, CT abdomen pelvis 09/17/2020 FINDINGS: CTA CHEST FINDINGS Cardiovascular: Motion degradation and streak artifact limits the exam. Non contrasted images of the chest demonstrate no definitive acute intramural  hematoma. Moderate aortic atherosclerosis. Negative for aortic dissection. Postsurgical changes of the aortic root and valve. Cardiomegaly. Cardiac pacing device with epicardial leads similar in position. No convincing pericardial effusion. Similar caliber and appearance of the postoperative aortic root. Mediastinum/Nodes: Midline trachea. No thyroid mass. Decreased mediastinal gas. Negative for fluid collection. Esophagus within normal limits. Lungs/Pleura: No sizable pleural effusion. Patchy atelectasis at the bases. No visible pneumothorax. Resolution of previously noted heterogeneous ground-glass densities. Musculoskeletal: Sternotomy. No acute osseous abnormality. Bilateral shoulder replacement with artifact. Review of the MIP images confirms the above findings. CTA ABDOMEN AND PELVIS FINDINGS VASCULAR Aorta: Normal caliber aorta without aneurysm, dissection, vasculitis or significant stenosis. Celiac: Patent without evidence of aneurysm, dissection, vasculitis or significant stenosis. SMA: Patent without evidence of aneurysm, dissection, vasculitis or significant stenosis. Renals: Both renal arteries are patent without evidence of aneurysm, dissection, vasculitis, fibromuscular dysplasia or significant stenosis. IMA: Patent without evidence of aneurysm, dissection, vasculitis or significant stenosis. Inflow: Patent without evidence of aneurysm, dissection, vasculitis or significant stenosis. Review of the MIP images confirms the above findings. NON-VASCULAR Hepatobiliary: No focal liver abnormality is seen. No gallstones, gallbladder wall thickening, or biliary dilatation. Pancreas: Unremarkable. No pancreatic ductal dilatation or surrounding inflammatory changes. Spleen: Normal in size without focal abnormality. Adrenals/Urinary Tract: Adrenal glands are normal. Bilateral renal cysts, no imaging follow-up recommended. No hydronephrosis. Bladder is partially obscured Stomach/Bowel: Stomach is within normal  limits. No evidence of bowel wall thickening, distention, or inflammatory changes. Lymphatic: No grossly enlarged lymph nodes. Reproductive: Largely obscured by artifact. Other: Negative for free air or significant pelvic effusion. Musculoskeletal: Bilateral hip replacements. Degenerative changes of the lumbar spine. Posterior fusion hardware at L4-L5. Review of the MIP images confirms the above findings. IMPRESSION: 1. Negative for acute aortic dissection. Stable postoperative appearance of the aortic root and ascending aorta. Decreased mediastinal gas compared to prior CT. Cardiomegaly. 2. No CT evidence for acute intra-abdominal or pelvic abnormality. 3. Aortic atherosclerosis. Aortic Atherosclerosis (ICD10-I70.0). Electronically Signed   By: KDonavan FoilM.D.   On: 09/25/2022 16:28     Assessment and Plan:   Chest Pain - Patient presented complaining of chest pain. Patient has a cognitive deficit at baseline, so history is supplemented by his mother and brother.  - Patient has not been complaining of chest pain prior to this episode. He walks around the house and about 1 mile daily, and he has not had chest pain or SOB on exertion - hsTn negative x2 in ED - Patient had a cath on 06/29/22 that showed moderate proximal stenosis in RCA. Otherwise had patent left main, LAD, and left circumflex with mild luminal irregularities. Had CABGx1 with SVR-RCA - Since patient is a poor historian, it is difficult to understand the characteristics of his chest pain. It is reassuring that he has not had sob or chest  pain on exertion prior to yesterday, and he has not had any declines in his exercise tolerance. Troponin also reassuring. Cath from 06/2022 showed single vessel disease in proximal RCA, which was treated with CABG x1. Overall, work up does not indicate an ischemic cause of chest pain.  No further workup recommended at this time  Chronic combined systolic and diastolic heart failure Hypotension  - Most  recent echocardiogram from 08/19/22 showed EF <32%, grade I diastolic dysfunction  - Patient is followed by the advanced heart failure [name] as an outpatient. Last seen on 11/22 and his spironolactone was increased to 12.5 mg BID  - Patient is euvolemic on exam. BNP 241, which is decreased from 473 on 11/22. Patient and family deny sob, exertional dyspnea, ankle edema  - Patient's brother reports that BP is has been in the 54D systolic at home - Resume home spironolactone 12.5 mg daily  - Decrease entresto to 24/26 mg BID  - Continue lasix 40 mg daily  - No BB due to junctional rhythm. No SGLT2i due to history of UTI  Ascending aortic aneurysm and severe AI S/p Bentall with bioprosthetic aortic valve 07/08/22 - Most recent echo from 08/19/22 that the prosthetic aortic valve was functioning normally, aortic root repaired  - CTA chest/abd/pelv this admission showed no aortic dissection, stable postoperative appearance of the aortic root and ascending aorta   Paroxysmal Atrial Fibrillation  - Patient had developed atrial fibrillation following Bentall and aortic valve replacement in 06/2022 - Maintaining nsr today  - Continue eliquis and amiodarone 200 mg daily   LBBB - Patient followed by EP as an outpatient for consideration of CRT-D. Seeing EP 1/10   Patient has an appointment with Advanced Heart Failure on 12/18   Risk Assessment/Risk Scores:   New York Heart Association (NYHA) Functional Class NYHA Class II  CHA2DS2-VASc Score = 3   This indicates a 3.2% annual risk of stroke. The patient's score is based upon: CHF History: 1 HTN History: 0 Diabetes History: 0 Stroke History: 0 Vascular Disease History: 1 Age Score: 1 Gender Score: 0     For questions or updates, please contact [place] Please consult [email or web address] for contact info under    Signed, Margie Billet, PA-C  09/26/2022 1:30 PM  Patient seen and examined.  Agree with above documentation.   Joe [name] is a 66 year old male with a history of chronic combined heart failure, CAD status post CABG, Bentall, paroxysmal atrial fibrillation, cognitive deficits who we were consulted for evaluation of heart failure and chest pain.  He underwent echocardiogram 06/2022 which showed EF 25 to 30%, severe aortic regurgitation, severe dilatation of aortic root measuring 65 mm.  Cath showed moderate RCA stenosis.  He underwent Bentall procedure with CABG x 1 (SVG-RCA) on 07/08/2022.  He presented yesterday with chest pain.  Vital signs notable for BP 86/62, heart rate 63.  Labs notable for creatinine 0.9, troponin 17 > 15, BNP 242.  EKG shows sinus bradycardia, nonspecific intraventricular conduction delay, rate 51.  Patient seen and examined.  Agree with above documentation.  On exam, patient is alert, regular rate and rhythm, no murmurs, lungs CTAB, no LE edema or JVD.  Per his mother, chest pain lasted about an hour and resolved with maalox.  Given continuous chest pain with negative troponins, suspect noncardiac chest pain.  Likely GI in etiology.  No further ischemic evaluation planned.  For his heart failure, he was noted to have soft BP, will decrease Entresto  to 24-26 mg twice daily.  His spironolactone was held, would restart at 12.5 mg daily.  He appears euvolemic, continue as needed lasix.  Has f/u appointment on 12/18 with Advanced Heart Failure.  Donato Heinz, MD

## 2022-09-26 NOTE — H&P (Signed)
History and Physical    Joe [name] CBJ:[id number] DOB: 1956-04-24 DOA: 09/25/2022  PCP: Willey Blade, MD   Patient coming from: Home   Chief Complaint: Low BP, chest pain   HPI: Joe [name] is a 66 y.o. 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  Patient's home health RN noted his blood pressure to be 80 systolic for the second day in a row and strongly recommended that he seek evaluation to the ED for this.  Patient denied lightheadedness or near syncope but reported that he developed burning central chest discomfort when he woke from a nap.  His discomfort was localized, waxing and waning without any alleviating or exacerbating factors, and not like any symptoms he has had previously.  Pain eventually resolved in the ED with GI cocktail before slowly returning again.  There has not been any recent leg swelling and patient's family notes that his weight has been down 4 pounds recently.  [facility] ED Course: Upon arrival to the ED, patient is found to be afebrile and saturating well on room air with systolic blood pressure as low as 80.  EKG demonstrates chronic LBBB.  CTA chest/abdomen/pelvis is negative for aortic dissection notable for stable postoperative appearance of the aortic root and ascending aorta.  Patient was treated with 250 cc of IV fluids and GI cocktail in the ED.  He was transferred to [facility] for admission.  Review of Systems:  All other systems reviewed and apart from HPI, are negative.  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   Murmur 05/26/2022   On mechanically assisted ventilation (HCC)    Osteoarthritis of left hip 01/16/2014   Osteoarthritis of right hip 05/15/2014   Primary localized osteoarthrosis of left shoulder 09/21/2017   PVC (premature ventricular contraction) 05/26/2022   Right ureteral stone    Speech impediment    Thoracic ascending aortic aneurysm ([place])    Umbilical hernia     Past Surgical History:  Procedure Laterality Date   ASCENDING AORTIC ROOT REPLACEMENT N/A 07/08/2022   Procedure: BENTALL PROCEDURE USING RESILIA KONECT 29MM CONDUIT;  Surgeon: Coralie Common, MD;  Location: [place];  Service: Open Heart Surgery;  Laterality: N/A;   BACK SURGERY     CARDIOVERSION N/A 07/23/2022   Procedure: CARDIOVERSION;  Surgeon: Jolaine Artist, MD;  Location: [facility] ENDOSCOPY;  Service: Cardiovascular;  Laterality: N/A;   CORONARY ARTERY BYPASS GRAFT N/A 07/08/2022   Procedure: CORONARY ARTERY BYPASS GRAFTING (CABG) X 1, USING ENDOSCOPICALLY HARVESTED RIGHT LEG GREATER SAPHENOUS VEIN;  Surgeon: Coralie Common, MD;  Location: [place];  Service: Open Heart Surgery;  Laterality: N/A;   CYSTOSCOPY W/ URETERAL STENT REMOVAL Right 04/19/2013   Procedure: CYSTOSCOPY WITH STENT REMOVAL;  Surgeon: Molli Hazard, MD;  Location: [place];  Service: Urology;  Laterality: Right;   CYSTOSCOPY WITH RETROGRADE PYELOGRAM, URETEROSCOPY AND STENT PLACEMENT Right 04/19/2013   Procedure: CYSTOSCOPY WITH RETROGRADE PYELOGRAM, URETEROSCOPY ;  Surgeon: Molli Hazard, MD;  Location: [facility];  Service: Urology;  Laterality: Right;   CYSTOSCOPY WITH STENT PLACEMENT Right 03/24/2013   Procedure: CYSTOSCOPY WITH STENT PLACEMENT;  Surgeon: Hanley Ben, MD;  Location: [facility];  Service: Urology;  Laterality: Right;   EPICARDIAL PACING LEAD PLACEMENT N/A 07/08/2022   Procedure: EPICARDIAL PACING LEAD PLACEMENT;  Surgeon: Coralie Common, MD;  Location: [place];  Service: Thoracic;  Laterality: N/A;   HOLMIUM LASER APPLICATION Right 0/05/6760   Procedure: HOLMIUM LASER APPLICATION;  Surgeon: Molli Hazard, MD;  Location: [facility];  Service: Urology;  Laterality: Right;   INGUINAL HERNIA REPAIR Left 03-04-2005   POSTERIOR LUMBAR FUSION  08-27-2011   L4 -- L5   RIGHT HEART CATH AND CORONARY ANGIOGRAPHY N/A 06/29/2022   Procedure: RIGHT HEART CATH AND CORONARY ANGIOGRAPHY;  Surgeon: Sherren Mocha, MD;  Location: [place] [facility];  Service: Cardiovascular;  Laterality: N/A;   RIGHT SHOULDER HEMIARTHROPLASTY  08-26-2010   OA   SHOULDER HEMI-ARTHROPLASTY Left 09/21/2017   Procedure: SHOULDER HEMI-ARTHROPLASTY;  Surgeon: Marchia Bond, MD;  Location: [place];  Service: Orthopedics;  Laterality: Left;   TEE WITHOUT CARDIOVERSION N/A 07/08/2022   Procedure: TRANSESOPHAGEAL ECHOCARDIOGRAM (TEE);  Surgeon: Coralie Common, MD;  Location: [place];  Service: Open Heart Surgery;  Laterality: N/A;   TEE WITHOUT CARDIOVERSION N/A 07/23/2022   Procedure: TRANSESOPHAGEAL ECHOCARDIOGRAM (TEE);  Surgeon: Jolaine Artist, MD;  Location: [place];  Service: Cardiovascular;  Laterality: N/A;   TONSILLECTOMY  AS CHILD   TOTAL HIP ARTHROPLASTY Left 01/16/2014   Procedure: TOTAL HIP ARTHROPLASTY;  Surgeon: Johnny Bridge, MD;  Location: [place];  Service: Orthopedics;  Laterality: Left;   TOTAL HIP ARTHROPLASTY Right 05/15/2014   Procedure: RIGHT TOTAL HIP ARTHROPLASTY;  Surgeon: Johnny Bridge, MD;  Location: [place];  Service: Orthopedics;  Laterality: Right;   TOTAL SHOULDER ARTHROPLASTY Right 2011    Social History:   reports that he has never smoked. He has never used smokeless tobacco. He reports that he does not drink alcohol and does  not use drugs.  Allergies  Allergen Reactions   Codeine Nausea Only   Levaquin [Levofloxacin In D5w] Diarrhea    Family History  Problem Relation Age of Onset   Heart failure Mother    Atrial fibrillation Mother    Heart failure Maternal Grandmother    Atrial fibrillation Maternal Grandmother      Prior to Admission medications   Medication Sig Start Date End Date Taking? Authorizing Provider  acetaminophen (TYLENOL) 325 MG tablet Take 650 mg by mouth every 6 (six) hours as needed for mild pain.    [provider]  amiodarone (PACERONE) 200 MG tablet Take 1 tablet (200 mg total) by mouth daily. 08/13/22   Stehler, Pricilla Larsson, PA-C  apixaban (ELIQUIS) 5 MG TABS tablet Take 1 tablet (5 mg total) by mouth 2 (two) times daily. 08/13/22   Stehler, Pricilla Larsson, PA-C  aspirin EC 81 MG tablet Take 1 tablet (81 mg total) by mouth daily. Swallow whole. 08/13/22   Stehler, Pricilla Larsson, PA-C  atorvastatin (LIPITOR) 80 MG tablet Take 1 tablet (80 mg total) by mouth daily. 08/13/22   Stehler, Pricilla Larsson, PA-C  Cholecalciferol (VITAMIN D3) 250 MCG (10000 UT) capsule Take  10,000 Units by mouth daily.    [provider]  Coenzyme Q10 (CO Q-10) 100 MG CAPS Take 100 mg by mouth daily.    [provider]  fluticasone (FLONASE) 50 MCG/ACT nasal spray Place 2 sprays into both nostrils daily. Patient taking differently: Place 2 sprays into both nostrils daily as needed for allergies. 01/09/20   Minette Brine, FNP  furosemide (LASIX) 20 MG tablet Take 2 tablets (40 mg total) by mouth daily. 09/02/22   Lyda Jester M, PA-C  leptospermum manuka honey (MEDIHONEY) PSTE paste Apply 1 Application topically daily. 08/13/22   Stehler, Pricilla Larsson, PA-C  OVER THE COUNTER MEDICATION PRUNE JUICE/FLAXSEED    [provider]  potassium chloride SA (KLOR-CON M) 20 MEQ tablet Take 1 tablet (20 mEq total) by mouth as needed (Take one tab on the days you require lasix). 08/13/22   Stehler, Pricilla Larsson, PA-C   sacubitril-valsartan (ENTRESTO) 49-51 MG Take 1 tablet by mouth 2 (two) times daily. 08/13/22   Stehler, Pricilla Larsson, PA-C  spironolactone (ALDACTONE) 25 MG tablet Take 0.5 tablets (12.5 mg total) by mouth 2 (two) times daily. 09/02/22   Consuelo Pandy, PA-C  tamsulosin (FLOMAX) 0.4 MG CAPS capsule Take 0.4 mg by mouth 2 (two) times daily. 04/11/22   [provider]  traMADol (ULTRAM) 50 MG tablet Take 1 tablet (50 mg total) by mouth every 6 (six) hours as needed for moderate pain. 09/02/22   Consuelo Pandy, PA-C    Physical Exam: Vitals:   09/25/22 2300 09/25/22 2339 09/26/22 0000 09/26/22 0122  BP: 107/67  100/67 107/71  Pulse: (!) 53  (!) 48   Resp: '20  13 17  '$ Temp:  98 F (36.7 C)  97.6 F (36.4 C)  TempSrc:  Oral  Oral  SpO2: 91%  93% 96%  Weight:      Height:         Constitutional: NAD, no pallor or diaphoresis  Eyes: PERTLA, lids and conjunctivae normal ENMT: Mucous membranes are moist. Posterior pharynx clear of any exudate or lesions.   Neck: supple, no masses  Respiratory: no wheezing, no crackles. No accessory muscle use.  Cardiovascular: S1 & S2 heard, regular rate and rhythm. No extremity edema.  Abdomen: No distension, no tenderness, soft. Bowel sounds active.  Musculoskeletal: no clubbing / cyanosis. No joint deformity upper and lower extremities.   Skin: no significant rashes, lesions, ulcers. Warm, dry, well-perfused. Neurologic: No gross facial asymmetry. Moving all extremities. Alert and oriented to person and place.  Psychiatric: Cooperative.    Labs and Imaging on Admission: I have personally reviewed following labs and imaging studies  CBC: Recent Labs  Lab 09/25/22 1420  WBC 7.3  HGB 11.7*  HCT 36.4*  MCV 96.0  PLT 518   Basic Metabolic Panel: Recent Labs  Lab 09/25/22 1420  NA 136  K 4.7  CL 103  CO2 23  GLUCOSE 109*  BUN 27*  CREATININE 0.90  CALCIUM 9.3   GFR: Estimated Creatinine Clearance: 73.4 mL/min (by C-G  formula based on SCr of 0.9 mg/dL). Liver Function Tests: Recent Labs  Lab 09/25/22 1420  AST 15  ALT 16  ALKPHOS 71  BILITOT 0.4  PROT 6.7  ALBUMIN 3.7   Recent Labs  Lab 09/25/22 1420  LIPASE 35   No results for input(s): "AMMONIA" in the last 168 hours. Coagulation Profile: No results for input(s): "INR", "PROTIME" in the last 168 hours. Cardiac Enzymes: No results for input(s): "CKTOTAL", "CKMB", "  CKMBINDEX", "TROPONINI" in the last 168 hours. BNP (last 3 results) No results for input(s): "PROBNP" in the last 8760 hours. HbA1C: No results for input(s): "HGBA1C" in the last 72 hours. CBG: No results for input(s): "GLUCAP" in the last 168 hours. Lipid Profile: No results for input(s): "CHOL", "HDL", "LDLCALC", "TRIG", "CHOLHDL", "LDLDIRECT" in the last 72 hours. Thyroid Function Tests: No results for input(s): "TSH", "T4TOTAL", "FREET4", "T3FREE", "THYROIDAB" in the last 72 hours. Anemia Panel: No results for input(s): "VITAMINB12", "FOLATE", "FERRITIN", "TIBC", "IRON", "RETICCTPCT" in the last 72 hours. Urine analysis:    Component Value Date/Time   COLORURINE YELLOW 07/27/2022 0810   APPEARANCEUR HAZY (A) 07/27/2022 0810   LABSPEC 1.035 (H) 07/27/2022 0810   PHURINE 5.0 07/27/2022 0810   GLUCOSEU NEGATIVE 07/27/2022 0810   HGBUR MODERATE (A) 07/27/2022 0810   BILIRUBINUR NEGATIVE 07/27/2022 0810   BILIRUBINUR negative 01/09/2020 0922   KETONESUR NEGATIVE 07/27/2022 0810   PROTEINUR NEGATIVE 07/27/2022 0810   UROBILINOGEN 0.2 01/09/2020 0922   UROBILINOGEN 1.0 12/11/2012 1223   NITRITE NEGATIVE 07/27/2022 0810   LEUKOCYTESUR LARGE (A) 07/27/2022 0810   Sepsis Labs: '@LABRCNTIP'$ (procalcitonin:4,lacticidven:4) )No results found for this or any previous visit (from the past 240 hour(s)).   Radiological Exams on Admission: CT Angio Chest/Abd/Pel for Dissection W and/or W/WO  Result Date: 09/25/2022 CLINICAL DATA:  Chest pain history of aortic aneurysm surgery  6 weeks ago EXAM: CT ANGIOGRAPHY CHEST, ABDOMEN AND PELVIS TECHNIQUE: Non-contrast CT of the chest was initially obtained. Multidetector CT imaging through the chest, abdomen and pelvis was performed using the standard protocol during bolus administration of intravenous contrast. Multiplanar reconstructed images and MIPs were obtained and reviewed to evaluate the vascular anatomy. RADIATION DOSE REDUCTION: This exam was performed according to the departmental dose-optimization program which includes automated exposure control, adjustment of the mA and/or kV according to patient size and/or use of iterative reconstruction technique. CONTRAST:  164m OMNIPAQUE IOHEXOL 350 MG/ML SOLN COMPARISON:  CT chest 07/27/2022, CT abdomen pelvis 09/17/2020 FINDINGS: CTA CHEST FINDINGS Cardiovascular: Motion degradation and streak artifact limits the exam. Non contrasted images of the chest demonstrate no definitive acute intramural hematoma. Moderate aortic atherosclerosis. Negative for aortic dissection. Postsurgical changes of the aortic root and valve. Cardiomegaly. Cardiac pacing device with epicardial leads similar in position. No convincing pericardial effusion. Similar caliber and appearance of the postoperative aortic root. Mediastinum/Nodes: Midline trachea. No thyroid mass. Decreased mediastinal gas. Negative for fluid collection. Esophagus within normal limits. Lungs/Pleura: No sizable pleural effusion. Patchy atelectasis at the bases. No visible pneumothorax. Resolution of previously noted heterogeneous ground-glass densities. Musculoskeletal: Sternotomy. No acute osseous abnormality. Bilateral shoulder replacement with artifact. Review of the MIP images confirms the above findings. CTA ABDOMEN AND PELVIS FINDINGS VASCULAR Aorta: Normal caliber aorta without aneurysm, dissection, vasculitis or significant stenosis. Celiac: Patent without evidence of aneurysm, dissection, vasculitis or significant stenosis. SMA: Patent  without evidence of aneurysm, dissection, vasculitis or significant stenosis. Renals: Both renal arteries are patent without evidence of aneurysm, dissection, vasculitis, fibromuscular dysplasia or significant stenosis. IMA: Patent without evidence of aneurysm, dissection, vasculitis or significant stenosis. Inflow: Patent without evidence of aneurysm, dissection, vasculitis or significant stenosis. Review of the MIP images confirms the above findings. NON-VASCULAR Hepatobiliary: No focal liver abnormality is seen. No gallstones, gallbladder wall thickening, or biliary dilatation. Pancreas: Unremarkable. No pancreatic ductal dilatation or surrounding inflammatory changes. Spleen: Normal in size without focal abnormality. Adrenals/Urinary Tract: Adrenal glands are normal. Bilateral renal cysts, no imaging follow-up recommended. No hydronephrosis. Bladder  is partially obscured Stomach/Bowel: Stomach is within normal limits. No evidence of bowel wall thickening, distention, or inflammatory changes. Lymphatic: No grossly enlarged lymph nodes. Reproductive: Largely obscured by artifact. Other: Negative for free air or significant pelvic effusion. Musculoskeletal: Bilateral hip replacements. Degenerative changes of the lumbar spine. Posterior fusion hardware at L4-L5. Review of the MIP images confirms the above findings. IMPRESSION: 1. Negative for acute aortic dissection. Stable postoperative appearance of the aortic root and ascending aorta. Decreased mediastinal gas compared to prior CT. Cardiomegaly. 2. No CT evidence for acute intra-abdominal or pelvic abnormality. 3. Aortic atherosclerosis. Aortic Atherosclerosis (ICD10-I70.0). Electronically Signed   By: Donavan Foil M.D.   On: 09/25/2022 16:28    EKG: Independently reviewed. Chronic LBBB.   Assessment/Plan  1. Chest pain; CAD  - Patient with moderate proximal RCA stenosis on cath from Sept 2023 managed medically p/w chest pain that began at rest and  resolved for a while after GI cocktail in ED  - Troponin is normal x2, there are no acute findings on CTA in ED, and EKG has chronic LBBB  - Suspect GI etiology given resolution with GI cocktail  - Repeat GI cocktail as needed, repeat EKG as needed for recurrent chest pain, continue cardiac monitoring, continue ASA and Lipitor   2. Hypotension d/t hypovolemia  - Pt's home health RN noted his SBP to be 80 2 days in a row and encourage presentation to ED  - No evidence for bleeding or infection; no acute findings on CTA chest/abd/pelvis in ED  - Weight is down 4 lbs per family report and BP normalized with small IVF bolus  - Hold diuretics initially, continue Entresto in am if BP remains stable overnight  3. Chronic combined systolic & diastolic CHF - Appears compensated on admission  - Hold diuretics initially as above, continue Entresto as BP allows, monitor daily wt and I/Os   4. PAF  - In SR on admission  - Continue amiodarone and Eliquis    5. Sacral wound  - Follows at wound clinic and said to be improving  - Continue wound care    6. LBBB - Planned for CRT once sacral wound heals     DVT prophylaxis: Eliquis  Code Status: Full  Level of Care: Level of care: Telemetry Medical Family Communication: Mother at bedside  Disposition Plan:  Patient is from: Home  Anticipated d/c is to: home  Anticipated d/c date is: 12/16 or 09/27/22   Patient currently: Pending stable BP  Consults called: none  Admission status: Observation     Vianne Bulls, MD [facility]  09/26/2022, 2:47 AM

## 2022-09-28 ENCOUNTER — Encounter (HOSPITAL_COMMUNITY): Payer: Self-pay

## 2022-09-28 ENCOUNTER — Ambulatory Visit (HOSPITAL_COMMUNITY)
Admission: RE | Admit: 2022-09-28 | Discharge: 2022-09-28 | Disposition: A | Discharge status: Home / Self Care | Payer: Medicare Other | Source: Ambulatory Visit | Attending: Family Medicine | Admitting: Family Medicine

## 2022-09-28 VITALS — BP 100/62 | HR 62 | Wt 151.0 lb

## 2022-09-28 DIAGNOSIS — I110 Hypertensive heart disease with heart failure: Secondary | ICD-10-CM | POA: Diagnosis not present

## 2022-09-28 DIAGNOSIS — S31000D Unspecified open wound of lower back and pelvis without penetration into retroperitoneum, subsequent encounter: Secondary | ICD-10-CM

## 2022-09-28 DIAGNOSIS — I2510 Atherosclerotic heart disease of native coronary artery without angina pectoris: Secondary | ICD-10-CM

## 2022-09-28 DIAGNOSIS — I428 Other cardiomyopathies: Secondary | ICD-10-CM | POA: Insufficient documentation

## 2022-09-28 DIAGNOSIS — Z95828 Presence of other vascular implants and grafts: Secondary | ICD-10-CM

## 2022-09-28 DIAGNOSIS — Z87440 Personal history of urinary (tract) infections: Secondary | ICD-10-CM | POA: Insufficient documentation

## 2022-09-28 DIAGNOSIS — Z7901 Long term (current) use of anticoagulants: Secondary | ICD-10-CM | POA: Insufficient documentation

## 2022-09-28 DIAGNOSIS — Z7982 Long term (current) use of aspirin: Secondary | ICD-10-CM | POA: Diagnosis not present

## 2022-09-28 DIAGNOSIS — I5022 Chronic systolic (congestive) heart failure: Secondary | ICD-10-CM | POA: Diagnosis not present

## 2022-09-28 DIAGNOSIS — Z86718 Personal history of other venous thrombosis and embolism: Secondary | ICD-10-CM

## 2022-09-28 DIAGNOSIS — I480 Paroxysmal atrial fibrillation: Secondary | ICD-10-CM

## 2022-09-28 DIAGNOSIS — Z79899 Other long term (current) drug therapy: Secondary | ICD-10-CM | POA: Insufficient documentation

## 2022-09-28 DIAGNOSIS — M25551 Pain in right hip: Secondary | ICD-10-CM

## 2022-09-28 DIAGNOSIS — M25552 Pain in left hip: Secondary | ICD-10-CM

## 2022-09-28 DIAGNOSIS — I447 Left bundle-branch block, unspecified: Secondary | ICD-10-CM

## 2022-09-28 DIAGNOSIS — Z951 Presence of aortocoronary bypass graft: Secondary | ICD-10-CM | POA: Insufficient documentation

## 2022-09-28 DIAGNOSIS — I340 Nonrheumatic mitral (valve) insufficiency: Secondary | ICD-10-CM

## 2022-09-28 DIAGNOSIS — Z953 Presence of xenogenic heart valve: Secondary | ICD-10-CM | POA: Diagnosis not present

## 2022-09-28 DIAGNOSIS — R0602 Shortness of breath: Secondary | ICD-10-CM | POA: Diagnosis present

## 2022-09-28 LAB — BASIC METABOLIC PANEL
Anion gap: 7 (ref 5–15)
BUN: 25 mg/dL — ABNORMAL HIGH (ref 8–23)
CO2: 26 mmol/L (ref 22–32)
Calcium: 9.2 mg/dL (ref 8.9–10.3)
Chloride: 104 mmol/L (ref 98–111)
Creatinine, Ser: 0.75 mg/dL (ref 0.61–1.24)
GFR, Estimated: >60 mL/min (ref >60)
Glucose, Bld: 101 mg/dL — ABNORMAL HIGH (ref 70–99)
Potassium: 4.1 mmol/L (ref 3.5–5.1)
Sodium: 137 mmol/L (ref 135–145)

## 2022-09-28 LAB — BRAIN NATRIURETIC PEPTIDE: B Natriuretic Peptide: 283.3 pg/mL — ABNORMAL HIGH (ref 0.0–100.0)

## 2022-09-28 NOTE — Progress Notes (Signed)
Advanced Heart Failure Clinic Note   PCP: Willey Blade, MD Primary Cardiologist: Skeet Latch, MD  HF Cardiologist: Dr. Haroldine Laws   Reason for Visit: CHF f/u  HPI: 66 y.o. male with past medication history of mild cognitive impairment and anxiety. He was found to have severe LV dysfunction in setting of markedly dilated aortic root (67 mm) and severe AI (tricuspid AoV). Echo 9/23 EF 25-30%, LV severely dilated, severe AI, Global HK, RV ok. LBBB also noted. [facility] 9/23 showed single vessel CAD w/ 60-70% pRCA stenosis, patent LM, LAD and LCx. RHC w/ low filling pressures and preserved CO, CI 2.53.    Underwent Bentall procedure + CABG x 1, SVR-RCA, on 9/26. Intraoperative TEE showed LVEF 20-25%, RV normal. Normal functioning bioprosthetic AoV w/ no AI and no perivalvular leak post repair. He had a prolonged hospital course as post op recovery was complicated by post-cardiotomy shock slow to wean off pressors, severe ileus/constipation, urinary retention/ obstruction requiring placement of foley catheter, atrial fibrillation requiring DCCV, RUE PICC Line Associated DVT, deconditioning and sacral wound. Home health arranged at discharge. Referred to outpatient wound clinic for ongoing management of sacral wound.   Of note, he was discharged home on 08/13/22. On 11/16 he was diagnosed w/ UTI. Started on abx.   Follow up with EP 11/23, given persistent sacral wound, decision made to defer CRT-D implant until healed given infection risk.   Seen for hospital follow-up 11/22. Mildly volume overloaded. Arlyce Harman increased to 12.5 mg bid. Med titration limited by soft BP.  Seen in ED 09/25/22 with CP. HsT neg x 2, felt pain likely GI in nature. BP was low so Entresto decreased to 24/26, spiro restarted at 12.5 daily.  Today he returns for HF follow up with his mother, who gives most of the history due to Konstantinos's cognitive impairment. Overall feeling fine. Mild SOB walking on flat ground further  distances. Denies palpitations, CP, dizziness, edema, or PND/Orthopnea. Appetite ok. No fever or chills. Weight at home 148-149 pounds. Taking all medications. Sacral wound improving, mom is dressing twice a day. Working with PT once a week and does daily exercise daily. No recent Lasix use.   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     amiodarone (PACERONE) 200 MG tablet Take 1 tablet (200 mg total) by mouth daily. 30 tablet 1   apixaban (ELIQUIS) 5 MG TABS tablet Take 1 tablet (5 mg total) by  mouth 2 (two) times daily. 60 tablet 1   aspirin EC 81 MG tablet Take 1 tablet (81 mg total) by mouth daily. Swallow whole. 30 tablet 12   atorvastatin (LIPITOR) 80 MG tablet Take 1 tablet (80 mg total) by mouth daily. 30 tablet 1   Cholecalciferol (VITAMIN D3) 250 MCG (10000 UT) capsule Take 10,000 Units by mouth daily.     Coenzyme Q10 (CO Q-10) 100 MG CAPS Take 100 mg by mouth daily.     fluticasone (FLONASE) 50 MCG/ACT nasal spray Place 2 sprays into both nostrils  daily. (Patient taking differently: Place 2 sprays into both nostrils daily as needed for allergies.) 16 g 1   furosemide (LASIX) 20 MG tablet Take 2 tablets (40 mg total) by mouth daily as needed for fluid.     leptospermum manuka honey (MEDIHONEY) PSTE paste Apply 1 Application topically daily. 15 mL 1   OVER THE COUNTER MEDICATION PRUNE JUICE/FLAXSEED     pantoprazole (PROTONIX) 40 MG tablet Take 1 tablet (40 mg total) by mouth daily. 30 tablet 0   potassium chloride SA (KLOR-CON M) 20 MEQ tablet Take 1 tablet (20 mEq total) by mouth as needed (Take one tab on the days you require lasix). 30 tablet 1   sacubitril-valsartan (ENTRESTO) 24-26 MG Take 1 tablet by mouth 2 (two) times daily. 60 tablet 0   spironolactone (ALDACTONE) 25 MG tablet Take 0.5 tablets (12.5 mg total) by mouth daily. 15 tablet 0   tamsulosin (FLOMAX) 0.4 MG CAPS capsule Take 0.4 mg by mouth 2 (two) times daily.     traMADol (ULTRAM) 50 MG tablet Take 1 tablet (50 mg total) by mouth every 6 (six) hours as needed for moderate pain. 30 tablet 0   No current facility-administered medications for this encounter.   Allergies  Allergen Reactions   Codeine Nausea Only   Levaquin [Levofloxacin In D5w] Diarrhea      Social History   Socioeconomic History   Marital status: Single    Spouse name: Not on file   Number of children: Not on file   Years of education: Not on file   Highest education level: Not on file  Occupational History   Occupation: unemployed  Tobacco Use   Smoking status: Never   Smokeless tobacco: Never  Vaping Use   Vaping Use: Never used  Substance and Sexual Activity   Alcohol use: No   Drug use: No   Sexual activity: Not Currently  Other Topics Concern   Not on file  Social History Narrative   Pt with delayed mental capabilities, 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  Lives with parents.    Social Determinants of Health   Financial Resource Strain: Low Risk  (10/11/2019)   Overall Financial Resource Strain (CARDIA)    Difficulty of Paying Living Expenses: Not hard at all  Food Insecurity: No Food Insecurity (09/26/2022)   Hunger Vital Sign    Worried About Running Out of Food in the Last Year: Never true    Ran Out of Food in the Last Year: Never true  Transportation Needs: No Transportation Needs (09/26/2022)   PRAPARE - Hydrologist (Medical): No    Lack of Transportation (Non-Medical): No  Physical Activity: Sufficiently Active (10/11/2019)   Exercise Vital Sign    Days of Exercise per Week: 5 days  Minutes of Exercise per Session: 40 min  Stress: Stress Concern Present (10/11/2019)   [place]    Feeling of Stress : To some extent  Social Connections: Not on file  Intimate Partner Violence: Not At Risk (09/26/2022)   Humiliation, Afraid, Rape, and Kick questionnaire    Fear of Current or Ex-Partner: No    Emotionally Abused: No    Physically Abused: No    Sexually Abused: No   Family History  Problem Relation Age of Onset   Heart failure Mother    Atrial fibrillation Mother    Heart failure Maternal Grandmother    Atrial fibrillation Maternal Grandmother    BP 100/62   Pulse 62   Wt 68.5 kg (151 lb)   SpO2 100%   BMI 23.65 kg/m   Wt Readings from Last 3 Encounters:  09/28/22 68.5 kg (151 lb)  09/26/22 66.4 kg (146 lb 6.2 oz)  09/02/22 74.6 kg (164 lb 6.4 oz)    PHYSICAL EXAM: General:  NAD. No resp difficulty, kyphotic HEENT: Normal Neck: Supple. No JVD. Carotids 2+ bilat; no bruits. No lymphadenopathy or thryomegaly appreciated. Cor: PMI nondisplaced. Regular rate & rhythm. No rubs, gallops or murmurs. Lungs: Clear Abdomen: Soft, nontender, nondistended. No hepatosplenomegaly. No bruits or masses. Good bowel sounds. Extremities: No cyanosis,  clubbing, rash, edema Neuro: Alert & oriented x 3, cranial nerves grossly intact. Moves all 4 extremities w/o difficulty. Affect pleasant.  ASSESSMENT & PLAN: 1. Chronic Systolic Heart Failure  - newly diagnosed - Echo 9/23 EF 25-30%, LV severely dilated, severe AI, Global HK, RV ok  - NICM, severity out of proportion to degree of CAD, likely valvular CM in setting of severe AI +/- LBBB CM  - s/p bioprosthetic AVR + CABG 9/23.  - Intraoperative TEE LVEF 20-25%, RV ok - Echo (07/21/22): EF 25% RV function improving. Moderate to severe MR (in setting of LBBB) AVR ok   - Echo (10/23):  EF 20% RV moderately reduced. Moderate to severe PI. AVR ok  - Echo (09/03/22): EF < 20%, RV mildly reduced, normal functioning aortic valve prosthesis - NYHA II. Volume looks OK today. GDMT limited by low BP.  - Continue spiro 12.5 mg daily. - Continue Entresto 24/26 mg bid. - With junctional rhythm, hold off on beta blocker and digoxin for now to avoid worsening A-V conduction.  - Avoid SGLT2i given h/o urinary retention, sacral wound and recent UTI.  - He has TED hose at home to help w/ LEE and reduce risk of orthostasis  - Labs today. - Will need eventual follow up echo, will defer until decision made on CRT-D.  2. Ascending Aortic Aneurysm/ Severe AI  - CTA  w/ severe dilation at the sinus of Valsalva, 67 mm. Moderate dilation of mid ascending aorta, 44 mm. Tricuspid AoV - s/p Bentall with bioprosthetic aortic valve 9/23. - Intraoperative TEE, no perivalvular leak post repair  - Echo 09/03/22: EF < 20%, RV mildly reduced, normal functioning aortic valve prosthesis - Plan for CR when able.  3. CAD - single vessel, 60-70% pRCA  - s/p CABG x 1, SVG- RCA  - Recent ED eval for CP, cardiac work up reassuring. Felt to be GI related now on PPI. No further CP. - Continue ASA + statin  - No BB for now w/ bradycardia.    4. LBBB - Will eventually need epicardial lead hooked up for CRT, once sacral wound  heals  -  Has f/u w/ EP 10/21/22.   5. Atrial fib/flutter, post-op - Required DCCV during hospital stay>>NSR. - CHA2DS2Vasc is 3.  - Regular on exam today. - Continue Eliquis 5 mg bid. No bleeding issues. Recent CBC ok. - Continue amiodarone 200 mg daily. Recent LFTs and TSH ok.  6. Mitral Regurgitation  - Mod-severe on echo 10/23, in setting of LBBB. - Planning HF optimization w/ CRT-D, as outlined above.    7. RUE PICC Line Associated DVT  - Complication of recent hospitalization.  - Continue Eliquis.   8. Sacral Wound - Followed by wound clinic. - Mom says it's healing.   9. Severe Hip Pain, R>L  - H/o b/l THR - Recently seen recently by ortho, pain felt 2/2 inactivity.  - f/u with PCP for pain management  Follow up in 3 months with Dr. Haroldine Laws.  [place], FNP 09/28/22

## 2022-09-28 NOTE — Patient Instructions (Signed)
Thank you for coming in today  Labs were done today, if any labs are abnormal the clinic will call you No news is good news  Your physician recommends that you schedule a follow-up appointment in: 3 months with Dr. Haroldine Laws    Do the following things EVERYDAY: Weigh yourself in the morning before breakfast. Write it down and keep it in a log. Take your medicines as prescribed Eat low salt foods--Limit salt (sodium) to 2000 mg per day.  Stay as active as you can everyday Limit all fluids for the day to less than 2 liters  At the [facility], you and your health needs are our priority. As part of our continuing mission to provide you with exceptional heart care, we have created designated Provider Care Teams. These Care Teams include your primary Cardiologist (physician) and Advanced Practice Providers (APPs- Physician Assistants and Nurse Practitioners) who all work together to provide you with the care you need, when you need it.   You may see any of the following providers on your designated Care Team at your next follow up: Dr Glori Bickers Dr Loralie Champagne Dr. Roxana Hires, NP Lyda Jester, [place] [facility] [place], [place] Forestine Na, NP Audry Riles, PharmD   Please be sure to bring in all your medications bottles to every appointment.   If you have any questions or concerns before your next appointment please send [place] a message through [place] or call our office at [phone].    TO LEAVE A MESSAGE FOR THE NURSE SELECT OPTION 2, PLEASE LEAVE A MESSAGE INCLUDING: YOUR NAME DATE OF BIRTH CALL BACK NUMBER REASON FOR CALL**this is important as we prioritize the call backs  YOU WILL RECEIVE A CALL BACK THE SAME DAY AS LONG AS YOU CALL BEFORE 4:00 PM

## 2022-10-08 ENCOUNTER — Other Ambulatory Visit (HOSPITAL_BASED_OUTPATIENT_CLINIC_OR_DEPARTMENT_OTHER): Payer: Self-pay | Admitting: Internal Medicine

## 2022-10-08 ENCOUNTER — Encounter (HOSPITAL_BASED_OUTPATIENT_CLINIC_OR_DEPARTMENT_OTHER): Payer: Medicare Other | Admitting: Internal Medicine

## 2022-10-08 ENCOUNTER — Other Ambulatory Visit (HOSPITAL_BASED_OUTPATIENT_CLINIC_OR_DEPARTMENT_OTHER): Payer: Self-pay | Admitting: Physical Therapy

## 2022-10-08 ENCOUNTER — Ambulatory Visit (HOSPITAL_BASED_OUTPATIENT_CLINIC_OR_DEPARTMENT_OTHER)
Admission: RE | Admit: 2022-10-08 | Discharge: 2022-10-08 | Disposition: A | Discharge status: Home / Self Care | Payer: Medicare Other | Source: Ambulatory Visit | Attending: Internal Medicine | Admitting: Internal Medicine

## 2022-10-08 DIAGNOSIS — Z951 Presence of aortocoronary bypass graft: Secondary | ICD-10-CM | POA: Diagnosis not present

## 2022-10-08 DIAGNOSIS — L89154 Pressure ulcer of sacral region, stage 4: Secondary | ICD-10-CM

## 2022-10-08 DIAGNOSIS — I5042 Chronic combined systolic (congestive) and diastolic (congestive) heart failure: Secondary | ICD-10-CM

## 2022-10-08 NOTE — Progress Notes (Signed)
Joe [name] [phone]) [phone].pdf Page 1 of 7 Visit Report for 10/08/2022 Chief Complaint Document Details Patient Name: Date of Service: Joe [name], [place] [name]. 10/08/2022 9:15 [name] Medical Record Number: [id number] Patient Account Number: [id number] Date of Birth/Sex: Treating RN: [birth year] (66 y.o. M) Primary Care Provider: Salena Saner Other Clinician: Referring Provider: Treating Provider/Extender: Joe [name] in Treatment: 6 Information Obtained from: Patient Chief Complaint 08/27/2022; patient presents for sacral wound Electronic Signature(s) Signed: 10/08/2022 10:17:05 AM By: Kalman Shan DO Entered By: Kalman Shan on 10/08/2022 10:13:37 -------------------------------------------------------------------------------- HPI Details Patient Name: Date of Service: Joe Herald, MA [place] [name]. 10/08/2022 9:15 [name] Medical Record Number: [id number] Patient Account Number: [id number] Date of Birth/Sex: Treating RN: 02-01-1956 (66 y.o. M) Primary Care Provider: Salena Saner Other Clinician: Referring Provider: Treating Provider/Extender: Joe [name] in Treatment: 6 History of Present Illness HPI Description: 08/27/2022 [name] is [name] 66 year old male with [name] past medical history of chronic combined systolic diastolic heart failure, coronary artery disease status post CABG x1, left bundle branch block That presents to the clinic for [name] sacral ulcer. Patient was admitted to the hospital on 07/08/2022 For 6 weeks and discharged on 08/13/2022 For placement of [name] bioprosthetic aortic valve due to an ascending aortic aneurysm. During this admission patient states that he developed the sacral ulcer. He is not bedbound and ambulates with [name] walker. He is currently living with his mother and home health comes out for dressing changes. Currently he is using Medihoney every 3 days.  There is mild odor. Currently denies systemic signs of infection. 11/30; patient presents for follow-up. He has been using Dakin's wet-to-dry dressings mother is present during the encounter. He has no issues or complaints today. He denies signs of infection. 12/14; patient presents for follow-up. He has been using Dakin's wet-to-dry dressings. He has no issues or complaints today. He denies signs of infection. 12/28; patient presents for follow-up. He has been using Dakin's wet-to-dry dressings. He has no issues or complaints today. Electronic Signature(s) Signed: 10/08/2022 10:17:05 AM By: Kalman Shan DO Entered By: Kalman Shan on 10/08/2022 10:13:58 Joe [name] ([id number]) [id number] Page 2 of 7 -------------------------------------------------------------------------------- Physical Exam Details Patient Name: Date of Service: Joe [name], [place] [name]. 10/08/2022 9:15 [name] Medical Record Number: [id number] Patient Account Number: [id number] Date of Birth/Sex: Treating RN: 1955-10-24 (66 y.o. M) Primary Care Provider: Salena Saner Other Clinician: Referring Provider: Treating Provider/Extender: Joe [name] in Treatment: 6 Constitutional respirations regular, non-labored and within target range for patient.. Cardiovascular 2+ dorsalis pedis/posterior tibialis pulses. Psychiatric pleasant and cooperative. Notes Sacral ulcer: Open wound with granulation tissue at the opening. This does undermine significantly for about 5 cm from the 10 to 1 o'clock position. No probing to bone. No signs of overt infection. Electronic Signature(s) Signed: 10/08/2022 10:17:05 AM By: Kalman Shan DO Entered By: Kalman Shan on 10/08/2022 10:14:44 -------------------------------------------------------------------------------- Physician Orders Details Patient Name: Date of Service: Joe Herald, [place] [name]. 10/08/2022 9:15 [name]  M Medical Record Number: [id number] Patient Account Number: [id number] Date of Birth/Sex: Treating RN: 1956-06-30 (66 y.o. Joe [name] Primary Care Provider: Salena Saner Other Clinician: Referring Provider: Treating Provider/Extender: Joe [name] in Treatment: 6 Verbal / Phone Orders: No Diagnosis Coding Follow-up Appointments ppointment in 2 weeks. - w/ Joe [name] Return [name] Anesthetic (In clinic) Topical Lidocaine 5% applied to wound bed Off-Loading Turn and  reposition every 2 hours Home Health No change in wound care orders this week; continue Home Health for wound care. May utilize formulary equivalent dressing for wound treatment orders unless otherwise specified. - Advance to change 3x [name] week. Pt.'s mother will change all other times. Wound Treatment Wound #1 - Sacrum Cleanser: Soap and Water ([place]) 2 x Per VOP/92 Days Discharge Instructions: May shower and wash wound with dial antibacterial soap and water prior to dressing change. Cleanser: Wound Cleanser (Home Health) 2 x Per Day/15 Days Discharge Instructions: Cleanse the wound with wound cleanser prior to applying [name] clean dressing using gauze sponges, not tissue or cotton balls. Peri-Wound Care: Skin Prep ([place]) 2 x Per TWK/46 Days Discharge Instructions: Use skin prep as directed Prim Dressing: Dakin's Solution 0.25%, 16 (oz) (Home Health) (Generic) 2 x Per Day/15 Days ary Discharge Instructions: Moisten gauze with Dakin's solution Prim Dressing: Plain packing strip 1/2 (in) 2 x Per Day/15 Days ary Discharge Instructions: Lightly pack as instructed [name], [name] ([id number]) [id number] Page 3 of 7 Secondary Dressing: ABD Pad, 5x9 (Home Health) (Generic) 2 x Per Day/15 Days Discharge Instructions: Apply over primary dressing as directed. Secured With: 68M Medipore H Soft Cloth Surgical T ape, 4 x 10 (in/yd) (Home Health) (Generic)  2 x Per Day/15 Days Discharge Instructions: Secure with tape as directed. Radiology X-ray, other - pelvis-3 view Electronic Signature(s) Signed: 10/08/2022 10:17:05 AM By: Kalman Shan DO Entered By: Kalman Shan on 10/08/2022 10:14:51 Prescription 10/08/2022 -------------------------------------------------------------------------------- Fitterer, Sheila [name]. Kalman Shan DO Patient Name: Provider: 02/06/1956 [id number] Date of Birth: NPI#: Jerilynn Mages [id number] Sex: DEA #: [phone] Phone #: License #: [place] Patient Address: [facility] [address] [place], [place] [address] [place], [place] [address] [phone] Allergies codeine; Levaquin Provider's Orders X-ray, other - pelvis-3 view Hand Signature: Date(s): Electronic Signature(s) Signed: 10/08/2022 10:17:05 AM By: Kalman Shan DO Entered By: Kalman Shan on 10/08/2022 10:14:51 -------------------------------------------------------------------------------- Problem List Details Patient Name: Date of Service: Joe Herald, [name]. 10/08/2022 9:15 [name] Medical Record Number: [id number] Patient Account Number: [id number] Date of Birth/Sex: Treating RN: 02/27/1956 (66 y.o. M) Primary Care Provider: Salena Saner Other Clinician: Referring Provider: Treating Provider/Extender: Joe [name] in Treatment: 6 Active Problems ICD-10 Encounter Code Description Active Date MDM Joice, [place] ([id number]) [id number] Page 4 of 7 Code Description Active Date MDM Diagnosis L89.154 Pressure ulcer of sacral region, stage 4 08/27/2022 No Yes I50.42 Chronic combined systolic (congestive) and diastolic (congestive) heart failure 08/27/2022 No Yes Z95.1 Presence of aortocoronary bypass graft 08/27/2022 No Yes Inactive Problems Resolved Problems Electronic Signature(s) Signed: 10/08/2022 10:17:05  AM By: Kalman Shan DO Entered By: Kalman Shan on 10/08/2022 10:13:26 -------------------------------------------------------------------------------- Progress Note Details Patient Name: Date of Service: Joe [name], [place] [name]. 10/08/2022 9:15 [name] Medical Record Number: [id number] Patient Account Number: [id number] Date of Birth/Sex: Treating RN: [birth year] (66 y.o. M) Primary Care Provider: Salena Saner Other Clinician: Referring Provider: Treating Provider/Extender: Joe [name] in Treatment: 6 Subjective Chief Complaint Information obtained from Patient 08/27/2022; patient presents for sacral wound History of Present Illness (HPI) 08/27/2022 [name] is [name] 66 year old male with [name] past medical history of chronic combined systolic diastolic heart failure, coronary artery disease status post CABG x1, left bundle branch block That presents to the clinic for [name] sacral ulcer. Patient was admitted to the hospital on 07/08/2022 For  6 weeks and discharged on 08/13/2022 For placement of [name] bioprosthetic aortic valve due to an ascending aortic aneurysm. During this admission patient states that he developed the sacral ulcer. He is not bedbound and ambulates with [name] walker. He is currently living with his mother and home health comes out for dressing changes. Currently he is using Medihoney every 3 days. There is mild odor. Currently denies systemic signs of infection. 11/30; patient presents for follow-up. He has been using Dakin's wet-to-dry dressings mother is present during the encounter. He has no issues or complaints today. He denies signs of infection. 12/14; patient presents for follow-up. He has been using Dakin's wet-to-dry dressings. He has no issues or complaints today. He denies signs of infection. 12/28; patient presents for follow-up. He has been using Dakin's wet-to-dry dressings. He has no issues or complaints today. Patient  History Family History Heart Disease - Mother,Maternal Grandparents. Social History Never smoker, Alcohol Use - Never, Drug Use - No History, Caffeine Use - Never. Medical History Cardiovascular Patient has history of Coronary Artery Disease, Hypertension Musculoskeletal Patient has history of Osteoarthritis - bilateral hips; left shoulder Hospitalization/Surgery History - cardioversions 07/2022. - CABG 07/08/2022. - hip replacements 2015. - 2018 left shoulder surgery. - epicardial pacing lead placement 07/08/2022. Medical [name] Surgical History Notes nd Constitutional Symptoms (General Health) cognitive deficits congenital brain damage expressive speech delay Cardiovascular [name], Joe [name] ([id number]) [phone].pdf Page 5 of 7 PVC LBBB thoracic ascending aortic aneurysm epicardial pacing lead placement Gastrointestinal umbilical hernia Genitourinary kidney stones Objective Constitutional respirations regular, non-labored and within target range for patient.. Vitals Time Taken: 9:33 AM, Height: 67 in, Weight: 160 lbs, BMI: 25.1, Temperature: 98.1 F, Pulse: 55 bpm, Respiratory Rate: 18 breaths/min, Blood Pressure: 95/60 mmHg. Cardiovascular 2+ dorsalis pedis/posterior tibialis pulses. Psychiatric pleasant and cooperative. General Notes: Sacral ulcer: Open wound with granulation tissue at the opening. This does undermine significantly for about 5 cm from the 10 to 1 o'clock position. No probing to bone. No signs of overt infection. Integumentary (Hair, Skin) Wound #1 status is Open. Original cause of wound was Pressure Injury. The date acquired was: 08/07/2022. The wound has been in treatment 6 weeks. The wound is located on the Sacrum. The wound measures 2.2cm length x 0.9cm width x 0.8cm depth; 1.555cm^2 area and 1.244cm^3 volume. There is muscle and Fat Layer (Subcutaneous Tissue) exposed. Tunneling has been noted at 12:00 with [name] maximum distance of 1.2cm.  Undermining begins at 10:00 and ends at 1:00 with [name] maximum distance of 5cm. There is [name] medium amount of serosanguineous drainage noted. The wound margin is distinct with the outline attached to the wound base. There is large (67-100%) red, pink granulation within the wound bed. There is no necrotic tissue within the wound bed. The periwound skin appearance did not exhibit: Callus, Crepitus, Excoriation, Induration, Rash, Scarring, Dry/Scaly, Maceration, Atrophie Blanche, Cyanosis, Ecchymosis, Hemosiderin Staining, Mottled, Pallor, Rubor, Erythema. The periwound has tenderness on palpation. Assessment Active Problems ICD-10 Pressure ulcer of sacral region, stage 4 Chronic combined systolic (congestive) and diastolic (congestive) heart failure Presence of aortocoronary bypass graft Patient's wound is stable. At the opening this has improved in size and appearance however there is still significant undermining. We have considered doing the wound VAC however would like to assure there is no underlying osteomyelitis before starting this. Patient has not had an image of this area and I recommend starting with an x-ray. Continue aggressive offloading. Continue Dakin's wet-to-dry dressings. Plan Follow-up Appointments: Return Appointment in 2  weeks. - w/ Dr. Heber Rienzi Anesthetic: (In clinic) Topical Lidocaine 5% applied to wound bed Off-Loading: Turn and reposition every 2 hours Home Health: No change in wound care orders this week; continue Home Health for wound care. May utilize formulary equivalent dressing for wound treatment orders unless otherwise specified. - Advance to change 3x [name] week. Pt.'s mother will change all other times. Radiology ordered were: X-ray, other - pelvis-3 view WOUND #1: - Sacrum Wound Laterality: Cleanser: Soap and Water (Home Health) 2 x Per Day/15 Days Discharge Instructions: May shower and wash wound with dial antibacterial soap and water prior to dressing  change. Cleanser: Wound Cleanser (Home Health) 2 x Per Day/15 Days Discharge Instructions: Cleanse the wound with wound cleanser prior to applying [name] clean dressing using gauze sponges, not tissue or cotton balls. Peri-Wound Care: Skin Prep (Home Health) 2 x Per UXN/23 Days Discharge Instructions: Use skin prep as directed Prim Dressing: Dakin's Solution 0.25%, 16 (oz) (Home Health) (Generic) 2 x Per Day/15 Days ary Discharge Instructions: Moisten gauze with Dakin's solution [name], Joe [name] ([id number]) [id number] Page 6 of 7 Prim Dressing: Plain packing strip 1/2 (in) 2 x Per Day/15 Days ary Discharge Instructions: Lightly pack as instructed Secondary Dressing: ABD Pad, 5x9 (Home Health) (Generic) 2 x Per Day/15 Days Discharge Instructions: Apply over primary dressing as directed. Secured With: 37M Medipore H Soft Cloth Surgical T ape, 4 x 10 (in/yd) (Home Health) (Generic) 2 x Per Day/15 Days Discharge Instructions: Secure with tape as directed. 1. Dakin's wet-to-dry dressings 2. Aggressive offloading 3. X-ray of pelvis Electronic Signature(s) Signed: 10/08/2022 10:17:05 AM By: Kalman Shan DO Entered By: Kalman Shan on 10/08/2022 10:16:22 -------------------------------------------------------------------------------- HxROS Details Patient Name: Date of Service: Joe Herald, [place] [name]. 10/08/2022 9:15 [name] Medical Record Number: [id number] Patient Account Number: [id number] Date of Birth/Sex: Treating RN: 21-May-1956 (66 y.o. M) Primary Care Provider: Salena Saner Other Clinician: Referring Provider: Treating Provider/Extender: Joe [name] in Treatment: 6 Constitutional Symptoms (General Health) Medical History: Past Medical History Notes: cognitive deficits congenital brain damage expressive speech delay Cardiovascular Medical History: Positive for: Coronary Artery Disease; Hypertension Past Medical  History Notes: PVC LBBB thoracic ascending aortic aneurysm epicardial pacing lead placement Gastrointestinal Medical History: Past Medical History Notes: umbilical hernia Genitourinary Medical History: Past Medical History Notes: kidney stones Musculoskeletal Medical History: Positive for: Osteoarthritis - bilateral hips; left shoulder Immunizations Pneumococcal Vaccine: Received Pneumococcal Vaccination: No Implantable Devices No devices added Hospitalization / Surgery History Joe [name], Joe [name] ([id number]) [id number] Page 7 of 7 Type of Hospitalization/Surgery cardioversions 07/2022 CABG 07/08/2022 hip replacements 2015 2018 left shoulder surgery epicardial pacing lead placement 07/08/2022 Family and Social History Heart Disease: Yes - Mother,Maternal Grandparents; Never smoker; Alcohol Use: Never; Drug Use: No History; Caffeine Use: Never; Financial Concerns: No; Food, Clothing or Shelter Needs: No; Support System Lacking: No; Transportation Concerns: No Electronic Signature(s) Signed: 10/08/2022 10:17:05 AM By: Kalman Shan DO Entered By: Kalman Shan on 10/08/2022 10:14:02 -------------------------------------------------------------------------------- SuperBill Details Patient Name: Date of Service: Joe Herald, MA [place] [name]. 10/08/2022 Medical Record Number: [id number] Patient Account Number: [id number] Date of Birth/Sex: Treating RN: [birth year] (66 y.o. Joe [name] Primary Care Provider: Salena Saner Other Clinician: Referring Provider: Treating Provider/Extender: Joe [name] in Treatment: 6 Diagnosis Coding ICD-10 Codes Code Description [phone] Pressure ulcer of sacral region, stage 4 I50.42 Chronic combined systolic (congestive) and diastolic (congestive) heart failure Z95.1 Presence of aortocoronary bypass graft Facility Procedures : CPT4 Code:  [id number] Description: 99213 - WOUND  CARE VISIT-LEV 3 EST PT Modifier: Quantity: 1 Physician Procedures : CPT4 Code Description Modifier [id number] - WC PHYS LEVEL 3 - EST PT ICD-10 Diagnosis Description L89.154 Pressure ulcer of sacral region, stage 4 I50.42 Chronic combined systolic (congestive) and diastolic (congestive) heart failure Z95.1 Presence  of aortocoronary bypass graft Quantity: 1 Electronic Signature(s) Signed: 10/08/2022 10:17:05 AM By: Kalman Shan DO Entered By: Kalman Shan on 10/08/2022 10:16:33

## 2022-10-08 NOTE — Progress Notes (Signed)
Noon, Emori A [phone]) [id number] Page 1 of 8 Visit Report for 10/08/2022 Arrival Information Details Patient Name: Date of Service: [name], [place] RK A. 10/08/2022 9:15 A M Medical Record Number: [id number] Patient Account Number: [id number] Date of Birth/Sex: Treating RN: 02/16/1956 (66 y.o. M) Primary Care Tashyra Adduci: Salena Saner Other Clinician: Referring Layanna Charo: Treating Khylen Riolo/Extender: Ether Griffins in Treatment: 6 Visit Information History Since Last Visit Added or deleted any medications: No Patient Arrived: Ambulatory Any new allergies or adverse reactions: No Arrival Time: 09:31 Had a fall or experienced change in No Transfer Assistance: None activities of daily living that may affect Patient Identification Verified: Yes risk of falls: Secondary Verification Process Completed: Yes Signs or symptoms of abuse/neglect since last visito No Patient Requires Transmission-Based Precautions: No Hospitalized since last visit: No Patient Has Alerts: Yes Implantable device outside of the clinic excluding No Patient Alerts: Patient on Blood Thinner cellular tissue based products placed in the center since last visit: Pain Present Now: Yes Electronic Signature(s) Signed: 10/08/2022 4:51:54 PM By: Erenest Blank Entered By: Erenest Blank on 10/08/2022 09:32:09 -------------------------------------------------------------------------------- Clinic Level of Care Assessment Details Patient Name: Date of Service: [name], [place] RK A. 10/08/2022 9:15 A M Medical Record Number: [id number] Patient Account Number: [id number] Date of Birth/Sex: Treating RN: [birth year] (67 y.o. Burnadette Pop, Lauren Primary Care Kensli Bowley: Salena Saner Other Clinician: Referring Reyhan Moronta: Treating Ema Hebner/Extender: Ether Griffins in Treatment: 6 Clinic Level of Care Assessment Items TOOL 4 Quantity  Score X- 1 0 Use when only an EandM is performed on FOLLOW-UP visit ASSESSMENTS - Nursing Assessment / Reassessment X- 1 10 Reassessment of Co-morbidities (includes updates in patient status) X- 1 5 Reassessment of Adherence to Treatment Plan ASSESSMENTS - Wound and Skin A ssessment / Reassessment X - Simple Wound Assessment / Reassessment - one wound 1 5 '[]'$  - 0 Complex Wound Assessment / Reassessment - multiple wounds '[]'$  - 0 Dermatologic / Skin Assessment (not related to wound area) ASSESSMENTS - Focused Assessment '[]'$  - 0 Circumferential Edema Measurements - multi extremities '[]'$  - 0 Nutritional Assessment / Counseling / Intervention '[]'$  - 0 Lower Extremity Assessment (monofilament, tuning fork, pulses) Wherry, Connery A ([id number]) [id number] Page 2 of 8 '[]'$  - 0 Peripheral Arterial Disease Assessment (using hand held doppler) ASSESSMENTS - Ostomy and/or Continence Assessment and Care '[]'$  - 0 Incontinence Assessment and Management '[]'$  - 0 Ostomy Care Assessment and Management (repouching, etc.) PROCESS - Coordination of Care X - Simple Patient / Family Education for ongoing care 1 15 '[]'$  - 0 Complex (extensive) Patient / Family Education for ongoing care X- 1 10 Staff obtains Programmer, systems, Records, T Results / Process Orders est '[]'$  - 0 Staff telephones HHA, Nursing Homes / Clarify orders / etc '[]'$  - 0 Routine Transfer to another Facility (non-emergent condition) '[]'$  - 0 Routine Hospital Admission (non-emergent condition) '[]'$  - 0 New Admissions / Biomedical engineer / Ordering NPWT Apligraf, etc. , '[]'$  - 0 Emergency Hospital Admission (emergent condition) X- 1 10 Simple Discharge Coordination '[]'$  - 0 Complex (extensive) Discharge Coordination PROCESS - Special Needs '[]'$  - 0 Pediatric / Minor Patient Management '[]'$  - 0 Isolation Patient Management '[]'$  - 0 Hearing / Language / Visual special needs '[]'$  - 0 Assessment of Community assistance  (transportation, D/C planning, etc.) '[]'$  - 0 Additional assistance / Altered mentation '[]'$  - 0 Support Surface(s) Assessment (bed, cushion, seat, etc.) INTERVENTIONS - Wound Cleansing / Measurement X - Simple Wound Cleansing -  one wound 1 5 '[]'$  - 0 Complex Wound Cleansing - multiple wounds X- 1 5 Wound Imaging (photographs - any number of wounds) '[]'$  - 0 Wound Tracing (instead of photographs) X- 1 5 Simple Wound Measurement - one wound '[]'$  - 0 Complex Wound Measurement - multiple wounds INTERVENTIONS - Wound Dressings X - Small Wound Dressing one or multiple wounds 1 10 '[]'$  - 0 Medium Wound Dressing one or multiple wounds '[]'$  - 0 Large Wound Dressing one or multiple wounds X- 1 5 Application of Medications - topical '[]'$  - 0 Application of Medications - injection INTERVENTIONS - Miscellaneous '[]'$  - 0 External ear exam '[]'$  - 0 Specimen Collection (cultures, biopsies, blood, body fluids, etc.) '[]'$  - 0 Specimen(s) / Culture(s) sent or taken to Lab for analysis '[]'$  - 0 Patient Transfer (multiple staff / Civil Service fast streamer / Similar devices) '[]'$  - 0 Simple Staple / Suture removal (25 or less) '[]'$  - 0 Complex Staple / Suture removal (26 or more) '[]'$  - 0 Hypo / Hyperglycemic Management (close monitor of Blood Glucose) '[]'$  - 0 Ankle / Brachial Index (ABI) - do not check if billed separately [place], [place] A ([id number]) [id number] Page 3 of 8 X- 1 5 Vital Signs Has the patient been seen at the hospital within the last three years: Yes Total Score: 90 Level Of Care: New/Established - Level 3 Electronic Signature(s) Signed: 10/08/2022 4:52:17 PM By: Rhae Hammock RN Entered By: Rhae Hammock on 10/08/2022 09:56:36 -------------------------------------------------------------------------------- Lower Extremity Assessment Details Patient Name: Date of Service: [name], [name] 10/08/2022 9:15 A M Medical Record Number: [id number] Patient Account Number:  [id number] Date of Birth/Sex: Treating RN: 16-Nov-1955 (66 y.o. M) Primary Care Shilah Hefel: Salena Saner Other Clinician: Referring Nabeel Gladson: Treating Kayde Warehime/Extender: Ether Griffins in Treatment: 6 Electronic Signature(s) Signed: 10/08/2022 4:51:54 PM By: Erenest Blank Entered By: Erenest Blank on 10/08/2022 09:34:12 -------------------------------------------------------------------------------- Multi Wound Chart Details Patient Name: Date of Service: [name], [name] 10/08/2022 9:15 A M Medical Record Number: [id number] Patient Account Number: [id number] Date of Birth/Sex: Treating RN: 1955/10/14 (65 y.o. M) Primary Care Makenzee Choudhry: Salena Saner Other Clinician: Referring Leilyn Frayre: Treating Zarya Lasseigne/Extender: Christian Mate Weeks in Treatment: 6 Vital Signs Height(in): 67 Pulse(bpm): 55 Weight(lbs): 160 Blood Pressure(mmHg): 95/60 Body Mass Index(BMI): 25.1 Temperature(F): 98.1 Respiratory Rate(breaths/min): 18 [1:Photos:] [N/A:N/A] Sacrum N/A N/A Wound Location: Pressure Injury N/A N/A Wounding Event: Pressure Ulcer N/A N/A Primary Etiology: Coronary Artery Disease, N/A N/A Comorbid History: Hypertension, Osteoarthritis 08/07/2022 N/A N/A Date Acquired: 6 N/A N/A Weeks of Treatment: Open N/A N/A Wound Status: No N/A N/A Wound Recurrence: [place], Keyondre A ([id number]) [id number] Page 4 of 8 [id number] N/A N/A Measurements L x W x D (cm) 1.555 N/A N/A A (cm) : rea 1.244 N/A N/A Volume (cm) : 91.00% N/A N/A % Reduction in A rea: 94.00% N/A N/A % Reduction in Volume: 12 Position 1 (o'clock): 1.2 Maximum Distance 1 (cm): 10 Starting Position 1 (o'clock): 1 Ending Position 1 (o'clock): 5 Maximum Distance 1 (cm): Yes N/A N/A Tunneling: Yes N/A N/A Undermining: Category/Stage IV N/A N/A Classification: Medium N/A N/A Exudate A mount: Serosanguineous  N/A N/A Exudate Type: red, brown N/A N/A Exudate Color: Distinct, outline attached N/A N/A Wound Margin: Large (67-100%) N/A N/A Granulation A mount: Red, Pink N/A N/A Granulation Quality: None Present (0%) N/A N/A Necrotic A mount: Fat Layer (Subcutaneous Tissue): Yes N/A N/A Exposed Structures: Muscle: Yes Fascia: No Tendon: No Joint: No Bone: No  Small (1-33%) N/A N/A Epithelialization: Excoriation: No N/A N/A Periwound Skin Texture: Induration: No Callus: No Crepitus: No Rash: No Scarring: No Maceration: No N/A N/A Periwound Skin Moisture: Dry/Scaly: No Atrophie Blanche: No N/A N/A Periwound Skin Color: Cyanosis: No Ecchymosis: No Erythema: No Hemosiderin Staining: No Mottled: No Pallor: No Rubor: No Yes N/A N/A Tenderness on Palpation: Treatment Notes Electronic Signature(s) Signed: 10/08/2022 10:17:05 AM By: Kalman Shan DO Entered By: Kalman Shan on 10/08/2022 10:13:30 -------------------------------------------------------------------------------- Multi-Disciplinary Care Plan Details Patient Name: Date of Service: [name], [name] 10/08/2022 9:15 A M Medical Record Number: [id number] Patient Account Number: [id number] Date of Birth/Sex: Treating RN: 1956/09/09 (66 y.o. Burnadette Pop, Lauren Primary Care Haniyah Maciolek: Salena Saner Other Clinician: Referring Alzora Ha: Treating Kaicen Desena/Extender: Ether Griffins in Treatment: 6 Active Inactive Wound/Skin Impairment Nursing Diagnoses: Impaired tissue integrity Knowledge deficit related to ulceration/compromised skin integrity Goals: Patient will have a decrease in wound volume by X% from date: (specify in notes) [place], [place] A ([id number]) [id number] Page 5 of 8 Date Initiated: 08/27/2022 Target Resolution Date: 10/10/2022 Goal Status: Active Patient/caregiver will verbalize understanding of skin care regimen Date Initiated:  08/27/2022 Target Resolution Date: 10/10/2022 Goal Status: Active Ulcer/skin breakdown will have a volume reduction of 30% by week 4 Date Initiated: 08/27/2022 Target Resolution Date: 10/10/2022 Goal Status: Active Interventions: Assess patient/caregiver ability to obtain necessary supplies Assess patient/caregiver ability to perform ulcer/skin care regimen upon admission and as needed Assess ulceration(s) every visit Notes: Electronic Signature(s) Signed: 10/08/2022 4:52:17 PM By: Rhae Hammock RN Entered By: Rhae Hammock on 10/08/2022 09:53:10 -------------------------------------------------------------------------------- Pain Assessment Details Patient Name: Date of Service: [name], [place] RK A. 10/08/2022 9:15 A M Medical Record Number: [id number] Patient Account Number: [id number] Date of Birth/Sex: Treating RN: Feb 10, 1956 (66 y.o. M) Primary Care Jahmya Onofrio: Salena Saner Other Clinician: Referring Mukesh Kornegay: Treating Alicha Raspberry/Extender: Ether Griffins in Treatment: 6 Active Problems Location of Pain Severity and Description of Pain Patient Has Paino Yes Site Locations Pain Location: Pain in Ulcers Rate the pain. Current Pain Level: 5 Pain Management and Medication Current Pain Management: Electronic Signature(s) Signed: 10/08/2022 4:51:54 PM By: Erenest Blank Entered By: Erenest Blank on 10/08/2022 09:34:03 [place], [place] A ([id number]) [id number] Page 6 of 8 -------------------------------------------------------------------------------- Patient/Caregiver Education Details Patient Name: Date of Service: [name], [place] RK A. 12/28/2023andnbsp9:15 A M Medical Record Number: [id number] Patient Account Number: [id number] Date of Birth/Gender: Treating RN: [birth year] (66 y.o. [place] Primary Care Physician: Salena Saner Other Clinician: Referring Physician: Treating  Physician/Extender: Ether Griffins in Treatment: 6 Education Assessment Education Provided To: Patient Education Topics Provided Wound/Skin Impairment: Methods: Explain/Verbal Responses: Reinforcements needed, State content correctly Electronic Signature(s) Signed: 10/08/2022 4:52:17 PM By: Rhae Hammock RN Entered By: Rhae Hammock on 10/08/2022 09:53:22 -------------------------------------------------------------------------------- Wound Assessment Details Patient Name: Date of Service: [name], [name] 10/08/2022 9:15 A M Medical Record Number: [id number] Patient Account Number: [id number] Date of Birth/Sex: Treating RN: [birth year] (66 y.o. M) Primary Care Jann Milkovich: Salena Saner Other Clinician: Referring Wilferd Ritson: Treating Valeska Haislip/Extender: Christian Mate Weeks in Treatment: 6 Wound Status Wound Number: 1 Primary Etiology: Pressure Ulcer Wound Location: Sacrum Wound Status: Open Wounding Event: Pressure Injury Comorbid History: Coronary Artery Disease, Hypertension, Osteoarthritis Date Acquired: 08/07/2022 Weeks Of Treatment: 6 Clustered Wound: No Photos Wound Measurements Length: (cm) 2.2 Width: (cm) 0.9 [place], Nhan A ([id number]) Depth: (cm) 0. Area: (cm) 1 Volume: (cm) 1 % Reduction in  Area: 91% % Reduction in Volume: 94% [id number] Page 7 of 8 8 Epithelialization: Small (1-33%) .555 Tunneling: Yes .244 Position (o'clock): 12 Maximum Distance: (cm) 1.2 Undermining: Yes Starting Position (o'clock): 10 Ending Position (o'clock): 1 Maximum Distance: (cm) 5 Wound Description Classification: Category/Stage IV Wound Margin: Distinct, outline attached Exudate Amount: Medium Exudate Type: Serosanguineous Exudate Color: red, brown Foul Odor After Cleansing: No Slough/Fibrino No Wound Bed Granulation Amount: Large (67-100%) Exposed Structure Granulation  Quality: Red, Pink Fascia Exposed: No Necrotic Amount: None Present (0%) Fat Layer (Subcutaneous Tissue) Exposed: Yes Tendon Exposed: No Muscle Exposed: Yes Necrosis of Muscle: Yes Joint Exposed: No Bone Exposed: No Periwound Skin Texture Texture Color No Abnormalities Noted: No No Abnormalities Noted: No Callus: No Atrophie Blanche: No Crepitus: No Cyanosis: No Excoriation: No Ecchymosis: No Induration: No Erythema: No Rash: No Hemosiderin Staining: No Scarring: No Mottled: No Pallor: No Moisture Rubor: No No Abnormalities Noted: No Dry / Scaly: No Temperature / Pain Maceration: No Tenderness on Palpation: Yes Electronic Signature(s) Signed: 10/08/2022 4:52:17 PM By: Rhae Hammock RN Entered By: Rhae Hammock on 10/08/2022 09:54:04 -------------------------------------------------------------------------------- Vitals Details Patient Name: Date of Service: [name], [name] 10/08/2022 9:15 A M Medical Record Number: [id number] Patient Account Number: [id number] Date of Birth/Sex: Treating RN: Feb 28, 1956 (65 y.o. M) Primary Care Travian Kerner: Salena Saner Other Clinician: Referring Albi Rappaport: Treating Yannis Gumbs/Extender: Ether Griffins in Treatment: 6 Vital Signs Time Taken: 09:33 Temperature (F): 98.1 Height (in): 67 Pulse (bpm): 55 Weight (lbs): 160 Respiratory Rate (breaths/min): 18 Body Mass Index (BMI): 25.1 Blood Pressure (mmHg): 95/60 Reference Range: 80 - 120 mg / dl Electronic Signature(s) Signed: 10/08/2022 4:51:54 PM By: Erenest Blank Mort,Signed: 10/08/2022 4:51:54 PM By: Algis Downs ([id number]) [id number] Page 8 of 8 Entered By: Erenest Blank on 10/08/2022 09:33:49

## 2022-10-09 ENCOUNTER — Other Ambulatory Visit (HOSPITAL_BASED_OUTPATIENT_CLINIC_OR_DEPARTMENT_OTHER): Payer: Self-pay

## 2022-10-10 ENCOUNTER — Other Ambulatory Visit: Payer: Self-pay | Admitting: Physician Assistant

## 2022-10-10 ENCOUNTER — Other Ambulatory Visit (HOSPITAL_BASED_OUTPATIENT_CLINIC_OR_DEPARTMENT_OTHER): Payer: Self-pay

## 2022-10-11 ENCOUNTER — Other Ambulatory Visit (HOSPITAL_BASED_OUTPATIENT_CLINIC_OR_DEPARTMENT_OTHER): Payer: Self-pay

## 2022-10-13 ENCOUNTER — Other Ambulatory Visit (HOSPITAL_BASED_OUTPATIENT_CLINIC_OR_DEPARTMENT_OTHER): Payer: Self-pay

## 2022-10-13 ENCOUNTER — Other Ambulatory Visit (HOSPITAL_BASED_OUTPATIENT_CLINIC_OR_DEPARTMENT_OTHER): Payer: Self-pay | Admitting: Cardiovascular Disease

## 2022-10-13 ENCOUNTER — Other Ambulatory Visit (HOSPITAL_COMMUNITY): Payer: Self-pay

## 2022-10-13 ENCOUNTER — Other Ambulatory Visit: Payer: Self-pay | Admitting: Physician Assistant

## 2022-10-13 MED FILL — Atorvastatin Calcium Tab 80 MG (Base Equivalent): ORAL | 30 days supply | Qty: 30 | Fill #0 | Status: AC

## 2022-10-13 MED FILL — Amiodarone HCl Tab 200 MG: ORAL | 30 days supply | Qty: 30 | Fill #0 | Status: AC

## 2022-10-13 NOTE — Telephone Encounter (Signed)
Rx request sent to pharmacy.  

## 2022-10-14 ENCOUNTER — Inpatient Hospital Stay: Admission: RE | Admit: 2022-10-14 | Payer: Self-pay | Source: Ambulatory Visit

## 2022-10-19 ENCOUNTER — Other Ambulatory Visit: Payer: Self-pay | Admitting: Physician Assistant

## 2022-10-19 ENCOUNTER — Other Ambulatory Visit (HOSPITAL_BASED_OUTPATIENT_CLINIC_OR_DEPARTMENT_OTHER): Payer: Self-pay

## 2022-10-21 ENCOUNTER — Ambulatory Visit: Payer: Medicare Other | Admitting: Student

## 2022-10-21 ENCOUNTER — Other Ambulatory Visit (HOSPITAL_COMMUNITY): Payer: Self-pay | Admitting: *Deleted

## 2022-10-21 ENCOUNTER — Other Ambulatory Visit (HOSPITAL_BASED_OUTPATIENT_CLINIC_OR_DEPARTMENT_OTHER): Payer: Self-pay

## 2022-10-21 MED ORDER — APIXABAN 5 MG PO TABS
5.0000 mg | ORAL_TABLET | Freq: Two times a day (BID) | ORAL | 11 refills | Status: DC
  Filled 2022-10-21: qty 60, 30d supply, fill #0
  Filled 2022-11-23: qty 60, 30d supply, fill #1
  Filled 2022-12-22 – 2022-12-23 (×2): qty 60, 30d supply, fill #2
  Filled 2023-01-25: qty 60, 30d supply, fill #3
  Filled 2023-02-22: qty 60, 30d supply, fill #4
  Filled 2023-04-01: qty 60, 30d supply, fill #5
  Filled 2023-05-06: qty 60, 30d supply, fill #6

## 2022-10-22 ENCOUNTER — Other Ambulatory Visit (HOSPITAL_BASED_OUTPATIENT_CLINIC_OR_DEPARTMENT_OTHER): Payer: Self-pay

## 2022-10-22 ENCOUNTER — Encounter (HOSPITAL_BASED_OUTPATIENT_CLINIC_OR_DEPARTMENT_OTHER): Payer: Medicare Other | Attending: Internal Medicine | Admitting: Internal Medicine

## 2022-10-22 DIAGNOSIS — I2510 Atherosclerotic heart disease of native coronary artery without angina pectoris: Secondary | ICD-10-CM | POA: Insufficient documentation

## 2022-10-22 DIAGNOSIS — I5042 Chronic combined systolic (congestive) and diastolic (congestive) heart failure: Secondary | ICD-10-CM | POA: Insufficient documentation

## 2022-10-22 DIAGNOSIS — Z951 Presence of aortocoronary bypass graft: Secondary | ICD-10-CM | POA: Diagnosis not present

## 2022-10-22 DIAGNOSIS — I110 Hypertensive heart disease with heart failure: Secondary | ICD-10-CM | POA: Diagnosis not present

## 2022-10-22 DIAGNOSIS — L89154 Pressure ulcer of sacral region, stage 4: Secondary | ICD-10-CM | POA: Insufficient documentation

## 2022-10-22 DIAGNOSIS — G8929 Other chronic pain: Secondary | ICD-10-CM | POA: Diagnosis not present

## 2022-10-22 DIAGNOSIS — Z953 Presence of xenogenic heart valve: Secondary | ICD-10-CM | POA: Diagnosis not present

## 2022-10-22 DIAGNOSIS — M4628 Osteomyelitis of vertebra, sacral and sacrococcygeal region: Secondary | ICD-10-CM | POA: Insufficient documentation

## 2022-10-22 NOTE — Progress Notes (Signed)
[name], [name] ([id number]) [id number] Page 1 of 7 Visit Report for 10/22/2022 Chief Complaint Document Details Patient Name: Date of Service: Joe [name], [place] RK A. 10/22/2022 9:15 A M Medical Record Number: [id number] Patient Account Number: [id number] Date of Birth/Sex: Treating RN: 01/27/1956 (67 y.o. M) Primary Care Provider: Salena [name] Other Clinician: Referring Provider: Treating Provider/Extender: Joe [name] in Treatment: 8 Information Obtained from: Patient Chief Complaint 08/27/2022; patient presents for sacral wound Electronic Signature(s) Signed: 10/22/2022 10:54:46 AM By: Joe Shan DO Entered By: Joe [name] on 10/22/2022 10:00:05 -------------------------------------------------------------------------------- HPI Details Patient Name: Date of Service: Joe Herald, [name] 10/22/2022 9:15 A M Medical Record Number: [id number] Patient Account Number: [id number] Date of Birth/Sex: Treating RN: 1956-04-21 (67 y.o. M) Primary Care Provider: Salena [name] Other Clinician: Referring Provider: Treating Provider/Extender: Joe [name] in Treatment: 8 History of Present Illness HPI Description: 08/27/2022 [name] is a 67 year old male with a past medical history of chronic combined systolic diastolic heart failure, coronary artery disease status post CABG x1, left bundle branch block That presents to the clinic for a sacral ulcer. Patient was admitted to the hospital on 07/08/2022 For 6 weeks and discharged on 08/13/2022 For placement of a bioprosthetic aortic valve due to Joe ascending aortic aneurysm. During this admission patient states that he developed the sacral ulcer. He is not bedbound and ambulates with a walker. He is currently living with his mother and home health comes out for dressing changes. Currently he is using Medihoney every 3 days. There  is mild odor. Currently denies systemic signs of infection. 11/30; patient presents for follow-up. He has been using Dakin's wet-to-dry dressings mother is present during the encounter. He has no issues or complaints today. He denies signs of infection. 12/14; patient presents for follow-up. He has been using Dakin's wet-to-dry dressings. He has no issues or complaints today. He denies signs of infection. 12/28; patient presents for follow-up. He has been using Dakin's wet-to-dry dressings. He has no issues or complaints today. 1/11; patient presents for follow-up. He had a sacral x-ray done at last clinic visit that showed findings consistent with osteomyelitis to the mid to inferior sacrum. He is scheduled to see infectious disease on 1/15. He has been using Dakin's wet-to-dry dressings. He has chronic pain to the site. He denies systemic signs of infection. Electronic Signature(s) Signed: 10/22/2022 10:54:46 AM By: Joe Shan DO Entered By: Joe [name] on 10/22/2022 10:00:53 Joe [name], [name] ([id number]) [id number] Page 2 of 7 -------------------------------------------------------------------------------- Physical Exam Details Patient Name: Date of Service: Joe [name], [place] RK A. 10/22/2022 9:15 A M Medical Record Number: [id number] Patient Account Number: [id number] Date of Birth/Sex: Treating RN: [birth year] (67 y.o. M) Primary Care Provider: Salena [name] Other Clinician: Referring Provider: Treating Provider/Extender: Joe [name] in Treatment: 8 Constitutional respirations regular, non-labored and within target range for patient.[name] Kitchen Psychiatric pleasant and cooperative. Notes Sacral ulcer: Open wound with granulation tissue at the opening. This does undermine significantly for about 5 cm from the 10 to 1 o'clock position. No probing to bone. No signs of overt infection. Electronic Signature(s) Signed: 10/22/2022  10:54:46 AM By: Joe Shan DO Entered By: Joe [name] on 10/22/2022 10:01:20 -------------------------------------------------------------------------------- Physician Orders Details Patient Name: Date of Service: Joe Herald, [name] 10/22/2022 9:15 A M Medical Record Number: [id number] Patient Account Number: [id number] Date of Birth/Sex: Treating RN: 1955-10-16 (67 y.o. [place] Noe Primary Care Provider:  Salena [name] Other Clinician: Referring Provider: Treating Provider/Extender: Joe [name] in Treatment: 8 Verbal / Phone Orders: No Diagnosis Coding Follow-up Appointments ppointment in 2 weeks. - w/ Dr. Heber [name] Return A Anesthetic (In clinic) Topical Lidocaine 5% applied to wound bed Off-Loading Turn and reposition every 2 hours Home Health No change in wound care orders this week; continue Home Health for wound care. May utilize formulary equivalent dressing for wound treatment orders unless otherwise specified. - Advance to change 3x a week. Pt.'s mother will change all other times. Wound Treatment Wound #1 - Sacrum Cleanser: Soap and Water ([place]) 2 x Per WER/15 Days Discharge Instructions: May shower and wash wound with dial antibacterial soap and water prior to dressing change. Cleanser: Wound Cleanser (Home Health) 2 x Per Day/15 Days Discharge Instructions: Cleanse the wound with wound cleanser prior to applying a clean dressing using gauze sponges, not tissue or cotton balls. Peri-Wound Care: Skin Prep (Home Health) 2 x Per QMG/86 Days Discharge Instructions: Use skin prep as directed Prim Dressing: Dakin's Solution 0.25%, 16 (oz) (Home Health) (Generic) 2 x Per PYP/95 Days ary Discharge Instructions: Moisten gauze with Dakin's solution [name], [name] ([id number]) [id number] Page 3 of 7 Prim Dressing: Plain packing strip 1/2 (in) 2 x Per Day/15 Days ary Discharge Instructions:  Lightly pack as instructed Secondary Dressing: ABD Pad, 5x9 (Home Health) (Generic) 2 x Per Day/15 Days Discharge Instructions: Apply over primary dressing as directed. Secured With: 51M Medipore H Soft Cloth Surgical T ape, 4 x 10 (in/yd) (Home Health) (Generic) 2 x Per Day/15 Days Discharge Instructions: Secure with tape as directed. Electronic Signature(s) Signed: 10/22/2022 10:54:46 AM By: Joe Shan DO Entered By: Joe [name] on 10/22/2022 10:01:27 -------------------------------------------------------------------------------- Problem List Details Patient Name: Date of Service: Joe Herald, [name] 10/22/2022 9:15 A M Medical Record Number: [id number] Patient Account Number: [id number] Date of Birth/Sex: Treating RN: 16-Apr-1956 (67 y.o. M) Primary Care Provider: Salena [name] Other Clinician: Referring Provider: Treating Provider/Extender: Joe [name] in Treatment: 8 Active Problems ICD-10 Encounter Code Description Active Date MDM Diagnosis L89.154 Pressure ulcer of sacral region, stage 4 08/27/2022 No Yes I50.42 Chronic combined systolic (congestive) and diastolic (congestive) heart failure 08/27/2022 No Yes Z95.1 Presence of aortocoronary bypass graft 08/27/2022 No Yes M46.28 Osteomyelitis of vertebra, sacral and sacrococcygeal region 10/22/2022 No Yes Inactive Problems Resolved Problems Electronic Signature(s) Signed: 10/22/2022 10:54:46 AM By: Joe Shan DO Entered By: Joe [name] on 10/22/2022 09:59:47 -------------------------------------------------------------------------------- Progress Note Details Patient Name: Date of Service: Joe Herald, MA [place] A. 10/22/2022 9:15 A M [place], [place] A ([id number]) [id number] Page 4 of 7 Medical Record Number: [id number] Patient Account Number: [id number] Date of Birth/Sex: Treating RN: 19-Apr-1956 (67 y.o. M) Primary Care Provider: Salena [name]  Other Clinician: Referring Provider: Treating Provider/Extender: Joe [name] in Treatment: 8 Subjective Chief Complaint Information obtained from Patient 08/27/2022; patient presents for sacral wound History of Present Illness (HPI) 08/27/2022 Mr. Keddrick Wyne is a 67 year old male with a past medical history of chronic combined systolic diastolic heart failure, coronary artery disease status post CABG x1, left bundle branch block That presents to the clinic for a sacral ulcer. Patient was admitted to the hospital on 07/08/2022 For 6 weeks and discharged on 08/13/2022 For placement of a bioprosthetic aortic valve due to Joe ascending aortic aneurysm. During this admission patient states that he developed the sacral ulcer. He is not bedbound and ambulates  with a walker. He is currently living with his mother and home health comes out for dressing changes. Currently he is using Medihoney every 3 days. There is mild odor. Currently denies systemic signs of infection. 11/30; patient presents for follow-up. He has been using Dakin's wet-to-dry dressings mother is present during the encounter. He has no issues or complaints today. He denies signs of infection. 12/14; patient presents for follow-up. He has been using Dakin's wet-to-dry dressings. He has no issues or complaints today. He denies signs of infection. 12/28; patient presents for follow-up. He has been using Dakin's wet-to-dry dressings. He has no issues or complaints today. 1/11; patient presents for follow-up. He had a sacral x-ray done at last clinic visit that showed findings consistent with osteomyelitis to the mid to inferior sacrum. He is scheduled to see infectious disease on 1/15. He has been using Dakin's wet-to-dry dressings. He has chronic pain to the site. He denies systemic signs of infection. Patient History Family History Heart Disease - Mother,Maternal Grandparents. Social History Never  smoker, Alcohol Use - Never, Drug Use - No History, Caffeine Use - Never. Medical History Cardiovascular Patient has history of Coronary Artery Disease, Hypertension Musculoskeletal Patient has history of Osteoarthritis - bilateral hips; left shoulder Hospitalization/Surgery History - cardioversions 07/2022. - CABG 07/08/2022. - hip replacements 2015. - 2018 left shoulder surgery. - epicardial pacing lead placement 07/08/2022. Medical A Surgical History Notes nd Constitutional Symptoms (General Health) cognitive deficits congenital brain damage expressive speech delay Cardiovascular PVC LBBB thoracic ascending aortic aneurysm epicardial pacing lead placement Gastrointestinal umbilical hernia Genitourinary kidney stones Objective Constitutional respirations regular, non-labored and within target range for patient.. Vitals Time Taken: 9:20 AM, Height: 67 in, Weight: 160 lbs, BMI: 25.1, Temperature: 97.7 F, Pulse: 75 bpm, Respiratory Rate: 18 breaths/min, Blood Pressure: 98/61 mmHg. Psychiatric pleasant and cooperative. General Notes: Sacral ulcer: Open wound with granulation tissue at the opening. This does undermine significantly for about 5 cm from the 10 to 1 o'clock position. No probing to bone. No signs of overt infection. Integumentary (Hair, Skin) Wound #1 status is Open. Original cause of wound was Pressure Injury. The date acquired was: 08/07/2022. The wound has been in treatment 8 weeks. The wound is located on the Sacrum. The wound measures 0.8cm length x 0.4cm width x 0.7cm depth; 0.251cm^2 area and 0.176cm^3 volume. There is muscle and Joe [name], [name] ([id number]) [id number] Page 5 of 7 Fat Layer (Subcutaneous Tissue) exposed. There is no undermining noted, however, there is tunneling at 12:00 with a maximum distance of 5.2cm. There is a medium amount of serosanguineous drainage noted. The wound margin is distinct with the outline attached to  the wound base. There is large (67-100%) red, pink granulation within the wound bed. There is no necrotic tissue within the wound bed. The periwound skin appearance did not exhibit: Callus, Crepitus, Excoriation, Induration, Rash, Scarring, Dry/Scaly, Maceration, Atrophie Blanche, Cyanosis, Ecchymosis, Hemosiderin Staining, Mottled, Pallor, Rubor, Erythema. The periwound has tenderness on palpation. Assessment Active Problems ICD-10 Pressure ulcer of sacral region, stage 4 Chronic combined systolic (congestive) and diastolic (congestive) heart failure Presence of aortocoronary bypass graft Osteomyelitis of vertebra, sacral and sacrococcygeal region Patient's wound is stable. Patient states he is offloading the area well. There is evidence of osteomyelitis on x-ray to his sacrum. He is scheduled to see infectious disease next week. For now recommended continuing to aggressively offload the area and use Dakin's wet-to-dry dressings. Plan Follow-up Appointments: Return Appointment in 2 weeks. - w/ Dr. Heber [place] Anesthetic: (  In clinic) Topical Lidocaine 5% applied to wound bed Off-Loading: Turn and reposition every 2 hours Home Health: No change in wound care orders this week; continue Home Health for wound care. May utilize formulary equivalent dressing for wound treatment orders unless otherwise specified. - Advance to change 3x a week. Pt.'s mother will change all other times. WOUND #1: - Sacrum Wound Laterality: Cleanser: Soap and Water ([place]) 2 x Per Day/15 Days Discharge Instructions: May shower and wash wound with dial antibacterial soap and water prior to dressing change. Cleanser: Wound Cleanser (Home Health) 2 x Per Day/15 Days Discharge Instructions: Cleanse the wound with wound cleanser prior to applying a clean dressing using gauze sponges, not tissue or cotton balls. Peri-Wound Care: Skin Prep (Home Health) 2 x Per Day/15 Days Discharge Instructions: Use skin prep as  directed Prim Dressing: Dakin's Solution 0.25%, 16 (oz) (Home Health) (Generic) 2 x Per Day/15 Days ary Discharge Instructions: Moisten gauze with Dakin's solution Prim Dressing: Plain packing strip 1/2 (in) 2 x Per Day/15 Days ary Discharge Instructions: Lightly pack as instructed Secondary Dressing: ABD Pad, 5x9 (Home Health) (Generic) 2 x Per Day/15 Days Discharge Instructions: Apply over primary dressing as directed. Secured With: 6M Medipore H Soft Cloth Surgical T ape, 4 x 10 (in/yd) (Home Health) (Generic) 2 x Per Day/15 Days Discharge Instructions: Secure with tape as directed. 1. Dakin's wet-to-dry dressings 2. Aggressive offloading 3. Follow-up in 2 weeks 4. Follow-up with infectious disease Electronic Signature(s) Signed: 10/22/2022 10:54:46 AM By: Joe Shan DO Entered By: Joe [name] on 10/22/2022 10:03:16 -------------------------------------------------------------------------------- HxROS Details Patient Name: Date of Service: Joe Herald, [name] 10/22/2022 9:15 A M Medical Record Number: [id number] Patient Account Number: [id number] Date of Birth/Sex: Treating RN: 1955/12/14 (67 y.o. M) Primary Care Provider: Salena [name] Other Clinician: Referring Provider: Treating Provider/Extender: Joe [name] in Treatment: [place], [place] A ([id number]) [id number] Page 6 of 7 Constitutional Symptoms (General Health) Medical History: Past Medical History Notes: cognitive deficits congenital brain damage expressive speech delay Cardiovascular Medical History: Positive for: Coronary Artery Disease; Hypertension Past Medical History Notes: PVC LBBB thoracic ascending aortic aneurysm epicardial pacing lead placement Gastrointestinal Medical History: Past Medical History Notes: umbilical hernia Genitourinary Medical History: Past Medical History Notes: kidney stones Musculoskeletal Medical  History: Positive for: Osteoarthritis - bilateral hips; left shoulder Immunizations Pneumococcal Vaccine: Received Pneumococcal Vaccination: No Implantable Devices No devices added Hospitalization / Surgery History Type of Hospitalization/Surgery cardioversions 07/2022 CABG 07/08/2022 hip replacements 2015 2018 left shoulder surgery epicardial pacing lead placement 07/08/2022 Family and Social History Heart Disease: Yes - Mother,Maternal Grandparents; Never smoker; Alcohol Use: Never; Drug Use: No History; Caffeine Use: Never; Financial Concerns: No; Food, Clothing or Shelter Needs: No; Support System Lacking: No; Transportation Concerns: No Electronic Signature(s) Signed: 10/22/2022 10:54:46 AM By: Joe Shan DO Entered By: Joe [name] on 10/22/2022 10:00:58 -------------------------------------------------------------------------------- SuperBill Details Patient Name: Date of Service: Joe Herald, MA [place] A. 10/22/2022 Medical Record Number: [id number] Patient Account Number: [id number] Date of Birth/Sex: Treating RN: 10-Oct-1956 (67 y.o. [place] Pop, [place] Primary Care Provider: Salena [name] Other Clinician: Referring Provider: Treating Provider/Extender: Joe [name] in Treatment: [place], [place] A ([id number]) [id number] Page 7 of 7 Diagnosis Coding ICD-10 Codes Code Description L89.154 Pressure ulcer of sacral region, stage 4 I50.42 Chronic combined systolic (congestive) and diastolic (congestive) heart failure Z95.1 Presence of aortocoronary bypass graft Facility Procedures : CPT4 Code: [id number] Description: [place] VISIT-LEV 3  EST PT Modifier: Quantity: 1 Physician Procedures : CPT4 Code Description Modifier [id number] - WC PHYS LEVEL 3 - EST PT ICD-10 Diagnosis Description L89.154 Pressure ulcer of sacral region, stage 4 I50.42 Chronic combined systolic (congestive) and diastolic  (congestive) heart failure Z95.1 Presence  of aortocoronary bypass graft Quantity: 1 Electronic Signature(s) Signed: 10/22/2022 10:54:46 AM By: Joe Shan DO Entered By: Joe [name] on 10/22/2022 10:03:28

## 2022-10-24 ENCOUNTER — Other Ambulatory Visit (HOSPITAL_BASED_OUTPATIENT_CLINIC_OR_DEPARTMENT_OTHER): Payer: Self-pay

## 2022-10-26 ENCOUNTER — Other Ambulatory Visit: Payer: Self-pay

## 2022-10-26 ENCOUNTER — Telehealth: Payer: Self-pay

## 2022-10-26 ENCOUNTER — Ambulatory Visit: Payer: Medicare Other | Admitting: Internal Medicine

## 2022-10-26 ENCOUNTER — Encounter: Payer: Self-pay | Admitting: Internal Medicine

## 2022-10-26 VITALS — BP 99/60 | HR 52 | Temp 97.8°F | Ht 67.0 in | Wt 155.0 lb

## 2022-10-26 DIAGNOSIS — S31000D Unspecified open wound of lower back and pelvis without penetration into retroperitoneum, subsequent encounter: Secondary | ICD-10-CM | POA: Diagnosis not present

## 2022-10-26 NOTE — Patient Instructions (Addendum)
8:45 AM 11/04/22 at [facility] entrance A, interventional radiology   [facility] home infusion: [phone]

## 2022-10-26 NOTE — Assessment & Plan Note (Addendum)
Patient developed a sacral decubitus ulcer during a recent prolonged hospital admission that is now complicated by osteomyelitis of the sacrum.  He has not had a prolonged antibiotic course to date.  We discussed antibiotics alone for this type of infection often times are not sufficient and also require aggressive offloading, wound care, nutrition, consideration of diverting colostomy, and possible flap procedure in the future.  He would like to proceed with aggressive antibiotic management at this time and we discussed that this does not guarantee eradication of the infection without initiating other measures as noted.  However, thus far they have been diligent with wound care, offloading, and keeping the area clean as possible.  Will arrange for PICC line and antibiotics as below.  Follow up in 4 weeks.   Diagnosis: Sacral wound with osteomyelitis  Culture Result: none  Allergies  Allergen Reactions   Codeine Nausea Only   Levaquin [Levofloxacin In D5w] Diarrhea    OPAT Orders Discharge antibiotics to be given via PICC line Discharge antibiotics: Per pharmacy protocol  Daptomycin '500mg'$  daily Ceftriaxone 2gm daily  Duration: 6 weeks from date of PICC line placement  PIC Care Per Protocol:  Home health RN for IV administration and teaching; PICC line care and labs.    Labs weekly while on IV antibiotics: _xxx_ CBC with differential __ BMP _xxx_ CMP _xxx_ CRP _xxx_ ESR __ Vancomycin trough _xxx_ CK  _xxxx_ Please pull PIC at completion of IV antibiotics __ Please leave PIC in place until doctor has seen patient or been notified  Fax weekly labs to [phone]

## 2022-10-26 NOTE — Telephone Encounter (Signed)
No short stay needed per Carolynn Sayers, first dose to be completed in home.   Beryle Flock, RN

## 2022-10-26 NOTE — Telephone Encounter (Signed)
New OPAT orders per Dr. Juleen China , orders shared with Carolynn Sayers, RN at [facility] and [facility] staff.   IR appointment: 11/04/22 9 AM - appointment time and location provided to both patient and [facility].   First dose: Awaiting response from Carolynn Sayers, RN to see if first dose can be done in home.   Beryle Flock, RN

## 2022-10-26 NOTE — Progress Notes (Signed)
[place]  Reason for Consult: Sacral wound and OM  Referring Provider: Dr Heber [place]   HPI:    Joe [name] is a 67 y.o. male with PMHx as below who presents to the clinic for a sacral wound complicated by osteomyelitis.   Patient was referred from the wound care center.  He has a history of chronic heart failure, CAD status post CABG, and a recent prolonged admission at [facility] from 07/08/2022 through 08/13/2022 when he was admitted for placement of a bioprosthetic aortic valve due to an ascending aortic aneurysm.  It was during this admission that patient developed a sacral ulcer.  He is typically ambulatory with a walker.  He was referred to the wound care center where he established on 08/27/2022.  There was consideration of a wound VAC given his nonhealing wound.  However, an x-ray was undertaken to ensure no deep infection was present.  Unfortunately, this showed irregularity of the soft tissues consistent with decubitus ulcer along with findings consistent with osteomyelitis of the mid to inferior sacrum.  This prompted referral to our clinic for further evaluation.  There are no cultures to guide his antibiotic options.  He has an allergy to Levaquin listed as diarrhea.  Patient's Medications  New Prescriptions   No medications on file  Previous Medications   ACETAMINOPHEN (TYLENOL) 325 MG TABLET    Take 650 mg by mouth every 6 (six) hours as needed for mild pain.   AMIODARONE (PACERONE) 200 MG TABLET    Take 1 tablet (200 mg total) by mouth daily.   APIXABAN (ELIQUIS) 5 MG TABS TABLET    Take 1 tablet (5 mg total) by mouth 2 (two) times daily.   ASPIRIN EC 81 MG TABLET    Take 1 tablet (81 mg total) by mouth daily. Swallow whole.   ATORVASTATIN (LIPITOR) 80 MG TABLET    Take 1 tablet (80 mg total) by mouth daily.   CHOLECALCIFEROL (VITAMIN D3) 250 MCG (10000 UT) CAPSULE    Take 10,000 Units by mouth daily.   COENZYME Q10 (CO Q-10) 100 MG CAPS     Take 100 mg by mouth daily.   FLUTICASONE (FLONASE) 50 MCG/ACT NASAL SPRAY    Place 2 sprays into both nostrils daily.   FUROSEMIDE (LASIX) 20 MG TABLET    Take 2 tablets (40 mg total) by mouth daily as needed for fluid.   LEPTOSPERMUM MANUKA HONEY (MEDIHONEY) PSTE PASTE    Apply 1 Application topically daily.   OVER THE COUNTER MEDICATION    PRUNE JUICE/FLAXSEED   PANTOPRAZOLE (PROTONIX) 40 MG TABLET    Take 1 tablet (40 mg total) by mouth daily.   POTASSIUM CHLORIDE SA (KLOR-CON M) 20 MEQ TABLET    Take 1 tablet (20 mEq total) by mouth as needed (Take one tab on the days you require lasix).   SACUBITRIL-VALSARTAN (ENTRESTO) 24-26 MG    Take 1 tablet by mouth 2 (two) times daily.   SPIRONOLACTONE (ALDACTONE) 25 MG TABLET    Take 0.5 tablets (12.5 mg total) by mouth daily.   TAMSULOSIN (FLOMAX) 0.4 MG CAPS CAPSULE    Take 0.4 mg by mouth 2 (two) times daily.   TRAMADOL (ULTRAM) 50 MG TABLET    Take 1 tablet (50 mg total) by mouth every 6 (six) hours as needed for moderate pain.  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   Murmur 05/26/2022   On mechanically assisted ventilation (HCC)    Osteoarthritis of left hip 01/16/2014   Osteoarthritis of right hip 05/15/2014   Primary localized osteoarthrosis of left shoulder 09/21/2017   PVC (premature ventricular contraction) 05/26/2022   Right ureteral stone    Speech impediment    Thoracic ascending aortic aneurysm (HCC)    Umbilical hernia      Social History   Tobacco Use   Smoking status: Never   Smokeless tobacco: Never  Vaping Use   Vaping Use: Never used  Substance Use Topics   Alcohol use: No   Drug use: No    Family History  Problem Relation Age of Onset   Heart failure Mother    Atrial fibrillation Mother    Heart failure Maternal Grandmother    Atrial fibrillation Maternal Grandmother     Allergies  Allergen Reactions   Codeine Nausea Only   Levaquin [Levofloxacin In D5w] Diarrhea    Review of Systems  Constitutional: Negative.   Gastrointestinal: Negative.   Skin:        + Sacral wound      OBJECTIVE:    Vitals:   10/26/22 1516  BP: 99/60  Pulse: (!) 52  Temp: 97.8 F (36.6 C)  TempSrc: Temporal  SpO2: 97%  Weight: 155 lb (70.3 kg)  Height: '5\' 7"'$  (1.702 m)     Body mass index is 24.28 kg/m.  Physical Exam Constitutional:      Appearance: Normal appearance.  Eyes:     Extraocular Movements: Extraocular movements intact.     Conjunctiva/sclera: Conjunctivae normal.  Abdominal:     General: There is no distension.     Palpations: Abdomen is soft.  Musculoskeletal:        General: Normal range of motion.     Cervical back: Normal range of motion and neck supple.  Skin:    General: Skin is warm and dry.  Neurological:     General: No focal deficit present.     Mental Status: He is alert and oriented to person, place, and time.  Psychiatric:        Mood and Affect: Mood normal.        Behavior: Behavior normal.      Labs and Microbiology:     Latest Ref Rng & Units 09/25/2022    2:20 PM 09/02/2022   10:20 AM 08/11/2022   12:48 PM  CBC  WBC 4.0 - 10.5 K/uL 7.3  7.5  6.4   Hemoglobin 13.0 - 17.0 g/dL 11.7  10.9  10.3   Hematocrit 39.0 - 52.0 % 36.4  32.8  31.6   Platelets 150 - 400 K/uL 318  323  259       Latest Ref Rng & Units 09/28/2022   11:05 AM 09/25/2022    2:20 PM 09/14/2022    9:00 AM  CMP  Glucose 70 - 99 mg/dL 101  109  127   BUN 8 - 23 mg/dL '25   27  18   '$ Creatinine 0.61 - 1.24 mg/dL 0.75  0.90  0.71   Sodium 135 - 145 mmol/L 137  136  137   Potassium 3.5 - 5.1 mmol/L 4.1  4.7  4.2   Chloride 98 - 111 mmol/L 104  103  107   CO2 22 - 32 mmol/L '26  23  26   '$ Calcium 8.9 - 10.3 mg/dL 9.2  9.3  8.7   Total Protein 6.5 - 8.1 g/dL  6.7  5.9   Total Bilirubin 0.3 - 1.2 mg/dL  0.4  0.4   Alkaline Phos 38 - 126 U/L  71  72   AST 15 - 41 U/L  15  15   ALT 0 - 44 U/L  16  15      No results found for this or any previous visit (from the past 240 hour(s)).    ASSESSMENT & PLAN:    Sacral wound Patient developed a sacral decubitus ulcer during a recent prolonged hospital admission that is now complicated by osteomyelitis of the sacrum.  He has not had a prolonged antibiotic course to date.  We discussed antibiotics alone for this type of infection often times are not sufficient and also require aggressive offloading, wound care, nutrition, consideration of diverting colostomy, and possible flap procedure in the future.  He would like to proceed with aggressive antibiotic management at this time and we discussed that this does not guarantee eradication of the infection without initiating other measures as noted.  However, thus far they have been diligent with wound care, offloading, and keeping the area clean as possible.  Will arrange for PICC line and antibiotics as below.  Follow up in 4 weeks.   Diagnosis: Sacral wound with osteomyelitis  Culture Result: none  Allergies  Allergen Reactions   Codeine Nausea Only   Levaquin [Levofloxacin In D5w] Diarrhea    OPAT Orders Discharge antibiotics to be given via PICC line Discharge antibiotics: Per pharmacy protocol  Daptomycin '500mg'$  daily Ceftriaxone 2gm daily  Duration: 6 weeks from date of PICC line placement  PIC Care Per Protocol:  Home health RN for IV administration and teaching; PICC line care and labs.    Labs weekly while on IV antibiotics: _xxx_ CBC with  differential __ BMP _xxx_ CMP _xxx_ CRP _xxx_ ESR __ Vancomycin trough _xxx_ CK  _xxxx_ Please pull PIC at completion of IV antibiotics __ Please leave PIC in place until doctor has seen patient or been notified  Fax weekly labs to [phone]   Orders Placed This Encounter  Procedures   COMPLETE METABOLIC PANEL WITH GFR   CBC   Sedimentation rate   C-reactive protein   CK (Creatine Kinase)         [facility] 10/26/2022, 3:39 PM   I have personally spent 45 minutes involved in face-to-face and non-face-to-face activities for this patient on the day of the visit. Professional time spent includes the following activities: Preparing to see the patient (review of tests), Obtaining and/or reviewing separately obtained history (admission/discharge record), Performing a medically appropriate examination and/or evaluation , Ordering medications/tests/procedures, referring and communicating with other health care professionals, Documenting clinical information in the EMR, Independently interpreting results (not separately reported), Communicating results to the patient/family/caregiver, Counseling and educating the patient/family/caregiver and Care coordination (not separately reported).

## 2022-10-27 LAB — COMPLETE METABOLIC PANEL WITH GFR
AG Ratio: 1.5 (calc) (ref 1.0–2.5)
ALT: 18 U/L (ref 9–46)
AST: 16 U/L (ref 10–35)
Albumin: 3.8 g/dL (ref 3.6–5.1)
Alkaline phosphatase (APISO): 73 U/L (ref 35–144)
BUN/Creatinine Ratio: 36 (calc) — ABNORMAL HIGH (ref 6–22)
BUN: 31 mg/dL — ABNORMAL HIGH (ref 7–25)
CO2: 28 mmol/L (ref 20–32)
Calcium: 9.3 mg/dL (ref 8.6–10.3)
Chloride: 102 mmol/L (ref 98–110)
Creat: 0.85 mg/dL (ref 0.70–1.35)
Globulin: 2.6 g/dL (calc) (ref 1.9–3.7)
Glucose, Bld: 95 mg/dL (ref 65–99)
Potassium: 5.2 mmol/L (ref 3.5–5.3)
Sodium: 135 mmol/L (ref 135–146)
Total Bilirubin: 0.6 mg/dL (ref 0.2–1.2)
Total Protein: 6.4 g/dL (ref 6.1–8.1)
eGFR: 95 mL/min/{1.73_m2} (ref >=60)

## 2022-10-27 LAB — CBC
HCT: 33.9 % — ABNORMAL LOW (ref 38.5–50.0)
Hemoglobin: 11.5 g/dL — ABNORMAL LOW (ref 13.2–17.1)
MCH: 32.2 pg (ref 27.0–33.0)
MCHC: 33.9 g/dL (ref 32.0–36.0)
MCV: 95 fL (ref 80.0–100.0)
MPV: 10.1 fL (ref 7.5–12.5)
Platelets: 276 10*3/uL (ref 140–400)
RBC: 3.57 10*6/uL — ABNORMAL LOW (ref 4.20–5.80)
RDW: 17.1 % — ABNORMAL HIGH (ref 11.0–15.0)
WBC: 6 10*3/uL (ref 3.8–10.8)

## 2022-10-27 LAB — CK: Total CK: 67 U/L (ref 44–196)

## 2022-10-27 LAB — SEDIMENTATION RATE: Sed Rate: 9 mm/h (ref 0–20)

## 2022-10-27 LAB — C-REACTIVE PROTEIN: CRP: 0.7 mg/L (ref <8.0)

## 2022-10-27 NOTE — Telephone Encounter (Signed)
Thank you Jinny Blossom!

## 2022-11-04 ENCOUNTER — Other Ambulatory Visit: Payer: Self-pay | Admitting: Internal Medicine

## 2022-11-04 ENCOUNTER — Other Ambulatory Visit (HOSPITAL_COMMUNITY): Payer: Self-pay | Admitting: Internal Medicine

## 2022-11-04 ENCOUNTER — Ambulatory Visit: Payer: Medicare Other | Admitting: Student

## 2022-11-04 ENCOUNTER — Encounter (HOSPITAL_BASED_OUTPATIENT_CLINIC_OR_DEPARTMENT_OTHER): Payer: Self-pay | Admitting: Pharmacist

## 2022-11-04 ENCOUNTER — Other Ambulatory Visit (HOSPITAL_BASED_OUTPATIENT_CLINIC_OR_DEPARTMENT_OTHER): Payer: Self-pay

## 2022-11-04 ENCOUNTER — Ambulatory Visit (HOSPITAL_COMMUNITY)
Admission: RE | Admit: 2022-11-04 | Discharge: 2022-11-04 | Disposition: A | Discharge status: Home / Self Care | Payer: Medicare Other | Source: Ambulatory Visit | Attending: Internal Medicine | Admitting: Internal Medicine

## 2022-11-04 DIAGNOSIS — S31000D Unspecified open wound of lower back and pelvis without penetration into retroperitoneum, subsequent encounter: Secondary | ICD-10-CM | POA: Insufficient documentation

## 2022-11-04 DIAGNOSIS — X58XXXD Exposure to other specified factors, subsequent encounter: Secondary | ICD-10-CM | POA: Insufficient documentation

## 2022-11-04 MED ORDER — HEPARIN SOD (PORK) LOCK FLUSH 100 UNIT/ML IV SOLN
INTRAVENOUS | Status: AC
  Filled 2022-11-04: qty 5

## 2022-11-04 MED ORDER — LIDOCAINE HCL 1 % IJ SOLN
INTRAMUSCULAR | Status: AC
  Filled 2022-11-04: qty 20

## 2022-11-04 MED FILL — Amiodarone HCl Tab 200 MG: ORAL | 30 days supply | Qty: 30 | Fill #1 | Status: CN

## 2022-11-04 NOTE — Procedures (Signed)
PROCEDURE SUMMARY:  Successful placement of image-guided single lumen PICC line to the right basilic vein. Length 37 cm. Tip at lower SVC/RA. No complications. EBL = < 5 ml. Ready for use.  Please see imaging section of Epic for full dictation.   Theresa Duty, NP 11/04/2022 1:26 PM

## 2022-11-05 ENCOUNTER — Other Ambulatory Visit (HOSPITAL_COMMUNITY): Payer: Self-pay

## 2022-11-05 ENCOUNTER — Other Ambulatory Visit: Payer: Self-pay

## 2022-11-05 ENCOUNTER — Encounter (HOSPITAL_BASED_OUTPATIENT_CLINIC_OR_DEPARTMENT_OTHER): Payer: Medicare Other | Admitting: Internal Medicine

## 2022-11-05 ENCOUNTER — Other Ambulatory Visit (HOSPITAL_BASED_OUTPATIENT_CLINIC_OR_DEPARTMENT_OTHER): Payer: Self-pay

## 2022-11-05 DIAGNOSIS — Z951 Presence of aortocoronary bypass graft: Secondary | ICD-10-CM

## 2022-11-05 DIAGNOSIS — L89154 Pressure ulcer of sacral region, stage 4: Secondary | ICD-10-CM | POA: Diagnosis not present

## 2022-11-05 DIAGNOSIS — I5042 Chronic combined systolic (congestive) and diastolic (congestive) heart failure: Secondary | ICD-10-CM

## 2022-11-05 MED ORDER — ENTRESTO 24-26 MG PO TABS
1.0000 | ORAL_TABLET | Freq: Two times a day (BID) | ORAL | 3 refills | Status: DC
  Filled 2022-11-05: qty 60, 30d supply, fill #0
  Filled 2023-01-04: qty 60, 30d supply, fill #1
  Filled 2023-02-08: qty 60, 30d supply, fill #2
  Filled 2023-03-09: qty 60, 30d supply, fill #3

## 2022-11-05 MED FILL — Amiodarone HCl Tab 200 MG: ORAL | 30 days supply | Qty: 30 | Fill #1 | Status: CN

## 2022-11-05 MED FILL — Amiodarone HCl Tab 200 MG: ORAL | 30 days supply | Qty: 30 | Fill #1 | Status: AC

## 2022-11-05 NOTE — Progress Notes (Addendum)
[name], [name] [phone]) [phone].pdf Page 1 of 7 Visit Report for 11/05/2022 Chief Complaint Document Details Patient Name: Date of Service: Joe [name], [place] RK A. 11/05/2022 11:15 A M Medical Record Number: [id number] Patient Account Number: [id number] Date of Birth/Sex: Treating RN: 1955/12/20 (67 y.o. M) Primary Care Provider: Salena Saner Other Clinician: Referring Provider: Treating Provider/Extender: Ether Griffins in Treatment: 10 Information Obtained from: Patient Chief Complaint 08/27/2022; patient presents for sacral wound Electronic Signature(s) Signed: 11/05/2022 2:31:01 PM By: Kalman Shan DO Entered By: Kalman Shan on 11/05/2022 12:33:34 -------------------------------------------------------------------------------- HPI Details Patient Name: Date of Service: Joe [name], [place] RK A. 11/05/2022 11:15 A M Medical Record Number: [id number] Patient Account Number: [id number] Date of Birth/Sex: Treating RN: 1956-04-23 (67 y.o. M) Primary Care Provider: Salena Saner Other Clinician: Referring Provider: Treating Provider/Extender: Ether Griffins in Treatment: 10 History of Present Illness HPI Description: 08/27/2022 [name] is a 67 year old male with a past medical history of chronic combined systolic diastolic heart failure, coronary artery disease status post CABG x1, left bundle branch block That presents to the clinic for a sacral ulcer. Patient was admitted to the hospital on 07/08/2022 For 6 weeks and discharged on 08/13/2022 For placement of a bioprosthetic aortic valve due to an ascending aortic aneurysm. During this admission patient states that he developed the sacral ulcer. He is not bedbound and ambulates with a walker. He is currently living with his mother and home health comes out for dressing changes. Currently he is using Medihoney every 3 days.  There is mild odor. Currently denies systemic signs of infection. 11/30; patient presents for follow-up. He has been using Dakin's wet-to-dry dressings mother is present during the encounter. He has no issues or complaints today. He denies signs of infection. 12/14; patient presents for follow-up. He has been using Dakin's wet-to-dry dressings. He has no issues or complaints today. He denies signs of infection. 12/28; patient presents for follow-up. He has been using Dakin's wet-to-dry dressings. He has no issues or complaints today. 1/11; patient presents for follow-up. He had a sacral x-ray done at last clinic visit that showed findings consistent with osteomyelitis to the mid to inferior sacrum. He is scheduled to see infectious disease on 1/15. He has been using Dakin's wet-to-dry dressings. He has chronic pain to the site. He denies systemic signs of infection. 1/25; patient presents for follow-up. He saw Dr. Juleen China with infectious disease on 1/15. Patient was started on a PICC line and plan is for 6 weeks of IV antibiotics. Patient has been using Dakin's wet-to-dry packing strips. Electronic Signature(s) Signed: 11/05/2022 2:31:01 PM By: Kalman Shan DO Entered By: Kalman Shan on 11/05/2022 12:35:01 [place], [place] A ([id number].pdf Page 2 of 7 -------------------------------------------------------------------------------- Physical Exam Details Patient Name: Date of Service: Joe [name], [place] RK A. 11/05/2022 11:15 A M Medical Record Number: [id number] Patient Account Number: [id number] Date of Birth/Sex: Treating RN: 03-Jun-1956 (67 y.o. M) Primary Care Provider: Salena Saner Other Clinician: Referring Provider: Treating Provider/Extender: Ether Griffins in Treatment: 10 Constitutional respirations regular, non-labored and within target range for patient.[name] Kitchen Psychiatric pleasant and cooperative. Notes Sacral  ulcer: Open wound with granulation tissue at the opening. This does undermine significantly for about 5 cm from the 10 to 1 o'clock position. No probing to bone. No signs of overt infection. Electronic Signature(s) Signed: 11/05/2022 2:31:01 PM By: Kalman Shan DO Entered By: Kalman Shan on 11/05/2022 12:35:24 -------------------------------------------------------------------------------- Physician  Orders Details Patient Name: Date of Service: Joe [name], [place] RK A. 11/05/2022 11:15 A M Medical Record Number: [id number] Patient Account Number: [id number] Date of Birth/Sex: Treating RN: [birth year] (67 y.o. [place] Noe Primary Care Provider: Salena Saner Other Clinician: Referring Provider: Treating Provider/Extender: Ether Griffins in Treatment: 10 Verbal / Phone Orders: No Diagnosis Coding Follow-up Appointments ppointment in 2 weeks. - w/ Dr. Heber [place] Thursday 11/26/22 @ 1100 Rm # 9 w/ Allayne Butcher Return A Other: - IV antibiotics started today 11/05/22 for 14 day course Anesthetic (In clinic) Topical Lidocaine 5% applied to wound bed Off-Loading Turn and reposition every 2 hours Home Health No change in wound care orders this week; continue Home Health for wound care. May utilize formulary equivalent dressing for wound treatment orders unless otherwise specified. - Advance to change 3x a week. Pt.'s mother will change all other times. Wound Treatment Wound #1 - Sacrum Cleanser: Soap and Water ([place]) 2 x Per [id number] Days Discharge Instructions: May shower and wash wound with dial antibacterial soap and water prior to dressing change. Cleanser: Wound Cleanser (Home Health) 2 x Per Day/15 Days Discharge Instructions: Cleanse the wound with wound cleanser prior to applying a clean dressing using gauze sponges, not tissue or cotton balls. Peri-Wound Care: Skin Prep (Home Health) 2 x Per [id number] Days Discharge Instructions: Use skin prep  as directed Prim Dressing: Dakin's Solution 0.25%, 16 (oz) (Home Health) (Generic) 2 x Per Day/15 Days ary [place], [place] A ([id number]) [phone].pdf Page 3 of 7 Discharge Instructions: Moisten gauze with Dakin's solution Prim Dressing: Plain packing strip 1/2 (in) 2 x Per Day/15 Days ary Discharge Instructions: Lightly pack as instructed Secondary Dressing: ABD Pad, 5x9 (Home Health) (Generic) 2 x Per Day/15 Days Discharge Instructions: Apply over primary dressing as directed. Secured With: 82M Medipore H Soft Cloth Surgical T ape, 4 x 10 (in/yd) (Home Health) (Generic) 2 x Per Day/15 Days Discharge Instructions: Secure with tape as directed. Electronic Signature(s) Signed: 11/05/2022 2:31:01 PM By: Kalman Shan DO Entered By: Kalman Shan on 11/05/2022 12:35:33 -------------------------------------------------------------------------------- Problem List Details Patient Name: Date of Service: Joe Herald, [place] A. 11/05/2022 11:15 A M Medical Record Number: [id number] Patient Account Number: [id number] Date of Birth/Sex: Treating RN: 1956/02/19 (67 y.o. M) Primary Care Provider: Salena Saner Other Clinician: Referring Provider: Treating Provider/Extender: Ether Griffins in Treatment: 10 Active Problems ICD-10 Encounter Code Description Active Date MDM Diagnosis L89.154 Pressure ulcer of sacral region, stage 4 08/27/2022 No Yes I50.42 Chronic combined systolic (congestive) and diastolic (congestive) heart failure 08/27/2022 No Yes Z95.1 Presence of aortocoronary bypass graft 08/27/2022 No Yes M46.28 Osteomyelitis of vertebra, sacral and sacrococcygeal region 10/22/2022 No Yes Inactive Problems Resolved Problems Electronic Signature(s) Signed: 11/05/2022 2:31:01 PM By: Kalman Shan DO Entered By: Kalman Shan on 11/05/2022 12:32:18 Progress Note  Details -------------------------------------------------------------------------------- Despina Hick ([id number].pdf Page 4 of 7 Patient Name: Date of Service: Joe [name], [place] A. 11/05/2022 11:15 A M Medical Record Number: [id number] Patient Account Number: [id number] Date of Birth/Sex: Treating RN: December 17, 1955 (67 y.o. M) Primary Care Provider: Salena Saner Other Clinician: Referring Provider: Treating Provider/Extender: Ether Griffins in Treatment: 10 Subjective Chief Complaint Information obtained from Patient 08/27/2022; patient presents for sacral wound History of Present Illness (HPI) 08/27/2022 Mr. Rafferty Elsberry is a 67 year old male with a past medical history of chronic combined systolic diastolic heart failure, coronary artery disease status post CABG x1,  left bundle branch block That presents to the clinic for a sacral ulcer. Patient was admitted to the hospital on 07/08/2022 For 6 weeks and discharged on 08/13/2022 For placement of a bioprosthetic aortic valve due to an ascending aortic aneurysm. During this admission patient states that he developed the sacral ulcer. He is not bedbound and ambulates with a walker. He is currently living with his mother and home health comes out for dressing changes. Currently he is using Medihoney every 3 days. There is mild odor. Currently denies systemic signs of infection. 11/30; patient presents for follow-up. He has been using Dakin's wet-to-dry dressings mother is present during the encounter. He has no issues or complaints today. He denies signs of infection. 12/14; patient presents for follow-up. He has been using Dakin's wet-to-dry dressings. He has no issues or complaints today. He denies signs of infection. 12/28; patient presents for follow-up. He has been using Dakin's wet-to-dry dressings. He has no issues or complaints today. 1/11; patient presents for  follow-up. He had a sacral x-ray done at last clinic visit that showed findings consistent with osteomyelitis to the mid to inferior sacrum. He is scheduled to see infectious disease on 1/15. He has been using Dakin's wet-to-dry dressings. He has chronic pain to the site. He denies systemic signs of infection. 1/25; patient presents for follow-up. He saw Dr. Juleen China with infectious disease on 1/15. Patient was started on a PICC line and plan is for 6 weeks of IV antibiotics. Patient has been using Dakin's wet-to-dry packing strips. Patient History Family History Heart Disease - Mother,Maternal Grandparents. Social History Never smoker, Alcohol Use - Never, Drug Use - No History, Caffeine Use - Never. Medical History Cardiovascular Patient has history of Coronary Artery Disease, Hypertension Musculoskeletal Patient has history of Osteoarthritis - bilateral hips; left shoulder Hospitalization/Surgery History - cardioversions 07/2022. - CABG 07/08/2022. - hip replacements 2015. - 2018 left shoulder surgery. - epicardial pacing lead placement 07/08/2022. Medical A Surgical History Notes nd Constitutional Symptoms (General Health) cognitive deficits congenital brain damage expressive speech delay Cardiovascular PVC LBBB thoracic ascending aortic aneurysm epicardial pacing lead placement Gastrointestinal umbilical hernia Genitourinary kidney stones Objective Constitutional respirations regular, non-labored and within target range for patient.. Vitals Time Taken: 11:23 AM, Height: 67 in, Weight: 160 lbs, BMI: 25.1, Temperature: 97.9 F, Pulse: 61 bpm, Respiratory Rate: 17 breaths/min, Blood Pressure: 96/60 mmHg. Psychiatric pleasant and cooperative. General Notes: Sacral ulcer: Open wound with granulation tissue at the opening. This does undermine significantly for about 5 cm from the 10 to 1 o'clock position. No probing to bone. No signs of overt infection. Scales, [place] A [phone]  [id number].pdf Page 5 of 7 Integumentary (Hair, Skin) Wound #1 status is Open. Original cause of wound was Pressure Injury. The date acquired was: 08/07/2022. The wound has been in treatment 10 weeks. The wound is located on the Sacrum. The wound measures 0.5cm length x 0.3cm width x 0.7cm depth; 0.118cm^2 area and 0.082cm^3 volume. There is muscle and Fat Layer (Subcutaneous Tissue) exposed. There is no tunneling noted, however, there is undermining starting at 12:00 and ending at 12:00 with a maximum distance of 5.4cm. There is a medium amount of serosanguineous drainage noted. The wound margin is distinct with the outline attached to the wound base. There is large (67-100%) red, pink granulation within the wound bed. There is no necrotic tissue within the wound bed. The periwound skin appearance did not exhibit: Callus, Crepitus, Excoriation, Induration, Rash, Scarring, Dry/Scaly, Maceration, Atrophie Blanche, Cyanosis, Ecchymosis, Hemosiderin  Staining, Mottled, Pallor, Rubor, Erythema. The periwound has tenderness on palpation. Assessment Active Problems ICD-10 Pressure ulcer of sacral region, stage 4 Chronic combined systolic (congestive) and diastolic (congestive) heart failure Presence of aortocoronary bypass graft Osteomyelitis of vertebra, sacral and sacrococcygeal region Patient has chronic osteomyelitis of the sacrum currently on IV antibiotics and plan is for 6 weeks of this. I recommended continuing to offload the area and using Dakin's wet-to-dry packing strips for dressing changes. Follow-up in 3 weeks. We discussed HBO therapy if the wound still remains open. Patient is agreeable to proceeding with this after completing 6 weeks of antibiotics and still open wound. Plan Follow-up Appointments: Return Appointment in 2 weeks. - w/ Dr. Heber [place] Thursday 11/26/22 @ 1100 Rm # 9 w/ Allayne Butcher Other: - IV antibiotics started today 11/05/22 for 14 day  course Anesthetic: (In clinic) Topical Lidocaine 5% applied to wound bed Off-Loading: Turn and reposition every 2 hours Home Health: No change in wound care orders this week; continue Home Health for wound care. May utilize formulary equivalent dressing for wound treatment orders unless otherwise specified. - Advance to change 3x a week. Pt.'s mother will change all other times. WOUND #1: - Sacrum Wound Laterality: Cleanser: Soap and Water ([place]) 2 x Per Day/15 Days Discharge Instructions: May shower and wash wound with dial antibacterial soap and water prior to dressing change. Cleanser: Wound Cleanser (Home Health) 2 x Per Day/15 Days Discharge Instructions: Cleanse the wound with wound cleanser prior to applying a clean dressing using gauze sponges, not tissue or cotton balls. Peri-Wound Care: Skin Prep (Home Health) 2 x Per Day/15 Days Discharge Instructions: Use skin prep as directed Prim Dressing: Dakin's Solution 0.25%, 16 (oz) (Home Health) (Generic) 2 x Per Day/15 Days ary Discharge Instructions: Moisten gauze with Dakin's solution Prim Dressing: Plain packing strip 1/2 (in) 2 x Per Day/15 Days ary Discharge Instructions: Lightly pack as instructed Secondary Dressing: ABD Pad, 5x9 (Home Health) (Generic) 2 x Per Day/15 Days Discharge Instructions: Apply over primary dressing as directed. Secured With: 32M Medipore H Soft Cloth Surgical T ape, 4 x 10 (in/yd) (Home Health) (Generic) 2 x Per Day/15 Days Discharge Instructions: Secure with tape as directed. 1. Dakin's wet-to-dry packing strips 2. Aggressive offloading 3. Continue IV antibiotics per ID 4. Follow-up in 3 weeks Electronic Signature(s) Signed: 11/30/2022 4:03:35 PM By: Kalman Shan DO Previous Signature: 11/05/2022 2:31:01 PM Version By: Kalman Shan DO Entered By: Kalman Shan on 11/26/2022 11:59:18 -------------------------------------------------------------------------------- HxROS  Details Patient Name: Date of Service: Joe [name], [place] A. 11/05/2022 11:15 A M Medical Record Number: [id number] Patient Account Number: [id number] [place], [place] ([id number]) [phone].pdf Page 6 of 7 Date of Birth/Sex: Treating RN: Feb 10, 1956 (67 y.o. M) Primary Care Provider: Other Clinician: Salena Saner Referring Provider: Treating Provider/Extender: Ether Griffins in Treatment: 10 Constitutional Symptoms (General Health) Medical History: Past Medical History Notes: cognitive deficits congenital brain damage expressive speech delay Cardiovascular Medical History: Positive for: Coronary Artery Disease; Hypertension Past Medical History Notes: PVC LBBB thoracic ascending aortic aneurysm epicardial pacing lead placement Gastrointestinal Medical History: Past Medical History Notes: umbilical hernia Genitourinary Medical History: Past Medical History Notes: kidney stones Musculoskeletal Medical History: Positive for: Osteoarthritis - bilateral hips; left shoulder Immunizations Pneumococcal Vaccine: Received Pneumococcal Vaccination: No Implantable Devices No devices added Hospitalization / Surgery History Type of Hospitalization/Surgery cardioversions 07/2022 CABG 07/08/2022 hip replacements 2015 2018 left shoulder surgery epicardial pacing lead placement 07/08/2022 Family and Social History Heart Disease: Yes -  Mother,Maternal Grandparents; Never smoker; Alcohol Use: Never; Drug Use: No History; Caffeine Use: Never; Financial Concerns: No; Food, Clothing or Shelter Needs: No; Support System Lacking: No; Transportation Concerns: No Electronic Signature(s) Signed: 11/05/2022 2:31:01 PM By: Kalman Shan DO Entered By: Kalman Shan on 11/05/2022 12:35:07 -------------------------------------------------------------------------------- SuperBill Details Patient Name: Date of Service: Joe Herald, MA  [place] A. 11/05/2022 Medical Record Number: [id number] Patient Account Number: [id number] Date of Birth/Sex: Treating RN: August 08, 1956 (67 y.o. Burnadette Pop, [place] Roller, [place] A ([id number].pdf Page 7 of 7 Primary Care Provider: Salena Saner Other Clinician: Referring Provider: Treating Provider/Extender: Ether Griffins in Treatment: 10 Diagnosis Coding ICD-10 Codes Code Description L89.154 Pressure ulcer of sacral region, stage 4 I50.42 Chronic combined systolic (congestive) and diastolic (congestive) heart failure Z95.1 Presence of aortocoronary bypass graft Facility Procedures : CPT4 Code: [id number] Description: 99213 - WOUND CARE VISIT-LEV 3 EST PT Modifier: Quantity: 1 Physician Procedures : CPT4 Code Description Modifier [id number] - WC PHYS LEVEL 3 - EST PT ICD-10 Diagnosis Description L89.154 Pressure ulcer of sacral region, stage 4 I50.42 Chronic combined systolic (congestive) and diastolic (congestive) heart failure Z95.1 Presence  of aortocoronary bypass graft Quantity: 1 Electronic Signature(s) Signed: 11/05/2022 2:31:01 PM By: Kalman Shan DO Entered By: Kalman Shan on 11/05/2022 12:37:50

## 2022-11-10 MED FILL — Atorvastatin Calcium Tab 80 MG (Base Equivalent): ORAL | 30 days supply | Qty: 30 | Fill #1 | Status: AC

## 2022-11-11 NOTE — Progress Notes (Signed)
[name], [name] [id number]) [phone].pdf Page 1 of 9 Visit Report for 11/05/2022 Arrival Information Details Patient Name: Date of Service: Joe [name], [place] RK A. 11/05/2022 11:15 A M Medical Record Number: [id number] Patient Account Number: [id number] Date of Birth/Sex: Treating RN: 1956/07/02 (67 y.o. Burnadette Pop, Lauren Primary Care Kyleen Villatoro: Salena Saner Other Clinician: Referring Teralyn Mullins: Treating Berania Peedin/Extender: Ether Griffins in Treatment: 10 Visit Information History Since Last Visit Added or deleted any medications: No Patient Arrived: Ambulatory Any new allergies or adverse reactions: No Arrival Time: 11:21 Had a fall or experienced change in No Accompanied By: mom activities of daily living that may affect Transfer Assistance: None risk of falls: Patient Identification Verified: Yes Signs or symptoms of abuse/neglect since last visito No Secondary Verification Process Completed: Yes Hospitalized since last visit: No Patient Requires Transmission-Based Precautions: No Implantable device outside of the clinic excluding No Patient Has Alerts: Yes cellular tissue based products placed in the center Patient Alerts: Patient on Blood Thinner since last visit: PICC LINE RIGHT ARM Has Dressing in Place as Prescribed: Yes NO BP'S RIGHT ARM Pain Present Now: Yes Electronic Signature(s) Signed: 11/11/2022 8:39:24 AM By: Rhae Hammock RN Entered By: Rhae Hammock on 11/05/2022 11:26:05 -------------------------------------------------------------------------------- Clinic Level of Care Assessment Details Patient Name: Date of Service: Joe [name], Joe RK A. 11/05/2022 11:15 A M Medical Record Number: [id number] Patient Account Number: [id number] Date of Birth/Sex: Treating RN: 08-Dec-1955 (67 y.o. Burnadette Pop, Lauren Primary Care Rajendra Spiller: Salena Saner Other Clinician: Referring Robbi Spells: Treating  Givanni Staron/Extender: Ether Griffins in Treatment: 10 Clinic Level of Care Assessment Items TOOL 4 Quantity Score X- 1 0 Use when only an EandM is performed on FOLLOW-UP visit ASSESSMENTS - Nursing Assessment / Reassessment X- 1 10 Reassessment of Co-morbidities (includes updates in patient status) X- 1 5 Reassessment of Adherence to Treatment Plan ASSESSMENTS - Wound and Skin A ssessment / Reassessment X - Simple Wound Assessment / Reassessment - one wound 1 5 '[]'$  - 0 Complex Wound Assessment / Reassessment - multiple wounds '[]'$  - 0 Dermatologic / Skin Assessment (not related to wound area) ASSESSMENTS - Focused Assessment '[]'$  - 0 Circumferential Edema Measurements - multi extremities '[]'$  - 0 Nutritional Assessment / Counseling / Intervention [name], [name] ([id number]) [id number] Page 2 of 9 '[]'$  - 0 Lower Extremity Assessment (monofilament, tuning fork, pulses) '[]'$  - 0 Peripheral Arterial Disease Assessment (using hand held doppler) ASSESSMENTS - Ostomy and/or Continence Assessment and Care '[]'$  - 0 Incontinence Assessment and Management '[]'$  - 0 Ostomy Care Assessment and Management (repouching, etc.) PROCESS - Coordination of Care '[]'$  - 0 Simple Patient / Family Education for ongoing care X- 1 20 Complex (extensive) Patient / Family Education for ongoing care X- 1 10 Staff obtains Programmer, systems, Records, T Results / Process Orders est '[]'$  - 0 Staff telephones HHA, Nursing Homes / Clarify orders / etc '[]'$  - 0 Routine Transfer to another Facility (non-emergent condition) '[]'$  - 0 Routine Hospital Admission (non-emergent condition) '[]'$  - 0 New Admissions / Biomedical engineer / Ordering NPWT Apligraf, etc. , '[]'$  - 0 Emergency Hospital Admission (emergent condition) '[]'$  - 0 Simple Discharge Coordination X- 1 15 Complex (extensive) Discharge Coordination PROCESS - Special Needs '[]'$  - 0 Pediatric / Minor Patient Management '[]'$   - 0 Isolation Patient Management '[]'$  - 0 Hearing / Language / Visual special needs '[]'$  - 0 Assessment of Community assistance (transportation, D/C planning, etc.) '[]'$  - 0 Additional assistance / Altered mentation '[]'$  -  0 Support Surface(s) Assessment (bed, cushion, seat, etc.) INTERVENTIONS - Wound Cleansing / Measurement X - Simple Wound Cleansing - one wound 1 5 '[]'$  - 0 Complex Wound Cleansing - multiple wounds X- 1 5 Wound Imaging (photographs - any number of wounds) '[]'$  - 0 Wound Tracing (instead of photographs) X- 1 5 Simple Wound Measurement - one wound '[]'$  - 0 Complex Wound Measurement - multiple wounds INTERVENTIONS - Wound Dressings X - Small Wound Dressing one or multiple wounds 1 10 '[]'$  - 0 Medium Wound Dressing one or multiple wounds '[]'$  - 0 Large Wound Dressing one or multiple wounds X- 1 5 Application of Medications - topical '[]'$  - 0 Application of Medications - injection INTERVENTIONS - Miscellaneous '[]'$  - 0 External ear exam '[]'$  - 0 Specimen Collection (cultures, biopsies, blood, body fluids, etc.) '[]'$  - 0 Specimen(s) / Culture(s) sent or taken to Lab for analysis '[]'$  - 0 Patient Transfer (multiple staff / Civil Service fast streamer / Similar devices) '[]'$  - 0 Simple Staple / Suture removal (25 or less) '[]'$  - 0 Complex Staple / Suture removal (26 or more) '[]'$  - 0 Hypo / Hyperglycemic Management (close monitor of Blood Glucose) [name], [name] ([id number]) [id number].pdf Page 3 of 9 '[]'$  - 0 Ankle / Brachial Index (ABI) - do not check if billed separately X- 1 5 Vital Signs Has the patient been seen at the hospital within the last three years: Yes Total Score: 100 Level Of Care: New/Established - Level 3 Electronic Signature(s) Signed: 11/11/2022 8:39:24 AM By: Rhae Hammock RN Entered By: Rhae Hammock on 11/05/2022 11:41:40 -------------------------------------------------------------------------------- Encounter Discharge Information  Details Patient Name: Date of Service: Joe [name], Joe RK A. 11/05/2022 11:15 A M Medical Record Number: [id number] Patient Account Number: [id number] Date of Birth/Sex: Treating RN: Apr 21, 1956 (67 y.o. Burnadette Pop, Lauren Primary Care Montia Haslip: Salena Saner Other Clinician: Referring Merisa Julio: Treating Jakeya Gherardi/Extender: Ether Griffins in Treatment: 10 Encounter Discharge Information Items Discharge Condition: Stable Ambulatory Status: Ambulatory Discharge Destination: Home Transportation: Private Auto Accompanied By: mom Schedule Follow-up Appointment: Yes Clinical Summary of Care: Patient Declined Electronic Signature(s) Signed: 11/11/2022 8:39:24 AM By: Rhae Hammock RN Entered By: Rhae Hammock on 11/05/2022 11:42:15 -------------------------------------------------------------------------------- Lower Extremity Assessment Details Patient Name: Date of Service: Joe [name], Joe RK A. 11/05/2022 11:15 A M Medical Record Number: [id number] Patient Account Number: [id number] Date of Birth/Sex: Treating RN: 10/22/1955 (67 y.o. Erie Noe Primary Care Bladimir Auman: Salena Saner Other Clinician: Referring Kathlene Yano: Treating Dechelle Attaway/Extender: Ether Griffins in Treatment: 10 Electronic Signature(s) Signed: 11/11/2022 8:39:24 AM By: Rhae Hammock RN Entered By: Rhae Hammock on 11/05/2022 11:25:36 -------------------------------------------------------------------------------- Multi Wound Chart Details Patient Name: Date of Service: Joe [name], [employer] A. 11/05/2022 11:15 A M Medical Record Number: [id number] Patient Account Number: [id number] [place], Amaya ([id number]) [phone].pdf Page 4 of 9 Date of Birth/Sex: Treating RN: December 11, 1955 (67 y.o. M) Primary Care Durrel Mcnee: Other Clinician: Salena Saner Referring Doloras Tellado: Treating Ayinde Swim/Extender: Ether Griffins in Treatment: 10 Vital Signs Height(in): 67 Pulse(bpm): 1 Weight(lbs): 160 Blood Pressure(mmHg): 61/60 Body Mass Index(BMI): 25.1 Temperature(F): 97.9 Respiratory Rate(breaths/min): 17 [1:Photos:] [N/A:N/A] Sacrum N/A N/A Wound Location: Pressure Injury N/A N/A Wounding Event: Pressure Ulcer N/A N/A Primary Etiology: Coronary Artery Disease, N/A N/A Comorbid History: Hypertension, Osteoarthritis 08/07/2022 N/A N/A Date Acquired: 10 N/A N/A Weeks of Treatment: Open N/A N/A Wound Status: No N/A N/A Wound Recurrence: 0.5x0.3x0.7 N/A N/A Measurements L x W x D (cm) 0.118  N/A N/A A (cm) : rea 0.082 N/A N/A Volume (cm) : 99.30% N/A N/A % Reduction in A rea: 99.60% N/A N/A % Reduction in Volume: 12 Starting Position 1 (o'clock): 12 Ending Position 1 (o'clock): 5.4 Maximum Distance 1 (cm): Yes N/A N/A Undermining: Category/Stage IV N/A N/A Classification: Medium N/A N/A Exudate A mount: Serosanguineous N/A N/A Exudate Type: red, brown N/A N/A Exudate Color: Distinct, outline attached N/A N/A Wound Margin: Large (67-100%) N/A N/A Granulation A mount: Red, Pink N/A N/A Granulation Quality: None Present (0%) N/A N/A Necrotic A mount: Fat Layer (Subcutaneous Tissue): Yes N/A N/A Exposed Structures: Muscle: Yes Fascia: No Tendon: No Joint: No Bone: No Small (1-33%) N/A N/A Epithelialization: Excoriation: No N/A N/A Periwound Skin Texture: Induration: No Callus: No Crepitus: No Rash: No Scarring: No Maceration: No N/A N/A Periwound Skin Moisture: Dry/Scaly: No Atrophie Blanche: No N/A N/A Periwound Skin Color: Cyanosis: No Ecchymosis: No Erythema: No Hemosiderin Staining: No Mottled: No Pallor: No Rubor: No Yes N/A N/A Tenderness on Palpation: Treatment Notes Wound #1 (Sacrum) Cleanser Soap and Water Discharge Instruction: May shower and wash wound with dial antibacterial soap and water prior to  dressing change. Breck, Atif A [id number]) [phone].pdf Page 5 of 9 Wound Cleanser Discharge Instruction: Cleanse the wound with wound cleanser prior to applying a clean dressing using gauze sponges, not tissue or cotton balls. Peri-Wound Care Skin Prep Discharge Instruction: Use skin prep as directed Topical Primary Dressing Dakin's Solution 0.25%, 16 (oz) Discharge Instruction: Moisten gauze with Dakin's solution Plain packing strip 1/2 (in) Discharge Instruction: Lightly pack as instructed Secondary Dressing ABD Pad, 5x9 Discharge Instruction: Apply over primary dressing as directed. Secured With 10M Medipore H Soft Cloth Surgical T ape, 4 x 10 (in/yd) Discharge Instruction: Secure with tape as directed. Compression Wrap Compression Stockings Add-Ons Electronic Signature(s) Signed: 11/05/2022 2:31:01 PM By: Kalman Shan DO Entered By: Kalman Shan on 11/05/2022 12:32:31 -------------------------------------------------------------------------------- Multi-Disciplinary Care Plan Details Patient Name: Date of Service: Joe [name], Joe RK A. 11/05/2022 11:15 A M Medical Record Number: [id number] Patient Account Number: [id number] Date of Birth/Sex: Treating RN: 1955/12/12 (67 y.o. Burnadette Pop, Lauren Primary Care Gavyn Zoss: Salena Saner Other Clinician: Referring Colby Reels: Treating Marsean Elkhatib/Extender: Ether Griffins in Treatment: 10 Active Inactive Wound/Skin Impairment Nursing Diagnoses: Impaired tissue integrity Knowledge deficit related to ulceration/compromised skin integrity Goals: Patient will have a decrease in wound volume by X% from date: (specify in notes) Date Initiated: 08/27/2022 Target Resolution Date: 11/14/2022 Goal Status: Active Patient/caregiver will verbalize understanding of skin care regimen Date Initiated: 08/27/2022 Target Resolution Date: 11/14/2022 Goal Status: Active Ulcer/skin  breakdown will have a volume reduction of 30% by week 4 Date Initiated: 08/27/2022 Target Resolution Date: 11/07/2022 Goal Status: Active Interventions: Assess patient/caregiver ability to obtain necessary supplies Oshita, Guymon A ([id number]) [phone].pdf Page 6 of 9 Assess patient/caregiver ability to perform ulcer/skin care regimen upon admission and as needed Assess ulceration(s) every visit Notes: Electronic Signature(s) Signed: 11/11/2022 8:39:24 AM By: Rhae Hammock RN Entered By: Rhae Hammock on 11/05/2022 11:37:47 -------------------------------------------------------------------------------- Pain Assessment Details Patient Name: Date of Service: Joe [name], [place] RK A. 11/05/2022 11:15 A M Medical Record Number: [id number] Patient Account Number: [id number] Date of Birth/Sex: Treating RN: 1956/07/26 (67 y.o. Erie Noe Primary Care Azayla Polo: Salena Saner Other Clinician: Referring Dimarco Minkin: Treating Makalya Nave/Extender: Ether Griffins in Treatment: 10 Active Problems Location of Pain Severity and Description of Pain Patient Has Paino Yes Site Locations Pain Location: Pain in  Ulcers With Dressing Change: Yes Duration of the Pain. Constant / Intermittento Intermittent Rate the pain. Current Pain Level: 5 Worst Pain Level: 10 Least Pain Level: 0 Tolerable Pain Level: 5 Character of Pain Describe the Pain: Aching Pain Management and Medication Current Pain Management: Medication: No Cold Application: No Rest: No Massage: No Activity: No T.E.N.S.: No Heat Application: No Leg drop or elevation: No Is the Current Pain Management Adequate: Adequate How does your wound impact your activities of daily livingo Sleep: No Bathing: No Appetite: No Relationship With Others: No Bladder Continence: No Emotions: No Bowel Continence: No Work: No Toileting: No Drive: No Dressing: No Hobbies:  No Electronic Signature(s) Signed: 11/11/2022 8:39:24 AM By: Rhae Hammock RN Entered By: Rhae Hammock on 11/05/2022 11:25:29 [place], [place] ([id number]) [id number] Page 7 of 9 -------------------------------------------------------------------------------- Patient/Caregiver Education Details Patient Name: Date of Service: Joe [name], [place] RK A. 1/25/2024andnbsp11:15 A M Medical Record Number: [id number] Patient Account Number: [id number] Date of Birth/Gender: Treating RN: [birth year] (67 y.o. Erie Noe Primary Care Physician: Salena Saner Other Clinician: Referring Physician: Treating Physician/Extender: Ether Griffins in Treatment: 10 Education Assessment Education Provided To: Patient Education Topics Provided Wound/Skin Impairment: Methods: Explain/Verbal Responses: Reinforcements needed, State content correctly Electronic Signature(s) Signed: 11/11/2022 8:39:24 AM By: Rhae Hammock RN Entered By: Rhae Hammock on 11/05/2022 11:37:58 -------------------------------------------------------------------------------- Wound Assessment Details Patient Name: Date of Service: Joe [name], Joe [place] A. 11/05/2022 11:15 A M Medical Record Number: [id number] Patient Account Number: [id number] Date of Birth/Sex: Treating RN: Mar 04, 1956 (67 y.o. Burnadette Pop, Lauren Primary Care Jaymere Alen: Salena Saner Other Clinician: Referring Janitza Revuelta: Treating Keilee Denman/Extender: Christian Mate Weeks in Treatment: 10 Wound Status Wound Number: 1 Primary Etiology: Pressure Ulcer Wound Location: Sacrum Wound Status: Open Wounding Event: Pressure Injury Comorbid History: Coronary Artery Disease, Hypertension, Osteoarthritis Date Acquired: 08/07/2022 Weeks Of Treatment: 10 Clustered Wound: No Photos Wound Measurements Length: (cm) 0 Width: (cm) 0 [place], Emoni A ([id number]) Depth: (cm)  0 Area: (cm) Volume: (cm) .5 % Reduction in Area: 99.3% .3 % Reduction in Volume: 99.6% [id number] Page 8 of 9 .7 Epithelialization: Small (1-33%) 0.118 Tunneling: No 0.082 Undermining: Yes Starting Position (o'clock): 12 Ending Position (o'clock): 12 Maximum Distance: (cm) 5.4 Wound Description Classification: Category/Stage IV Wound Margin: Distinct, outline attached Exudate Amount: Medium Exudate Type: Serosanguineous Exudate Color: red, brown Foul Odor After Cleansing: No Slough/Fibrino No Wound Bed Granulation Amount: Large (67-100%) Exposed Structure Granulation Quality: Red, Pink Fascia Exposed: No Necrotic Amount: None Present (0%) Fat Layer (Subcutaneous Tissue) Exposed: Yes Tendon Exposed: No Muscle Exposed: Yes Necrosis of Muscle: Yes Joint Exposed: No Bone Exposed: No Periwound Skin Texture Texture Color No Abnormalities Noted: No No Abnormalities Noted: No Callus: No Atrophie Blanche: No Crepitus: No Cyanosis: No Excoriation: No Ecchymosis: No Induration: No Erythema: No Rash: No Hemosiderin Staining: No Scarring: No Mottled: No Pallor: No Moisture Rubor: No No Abnormalities Noted: No Dry / Scaly: No Temperature / Pain Maceration: No Tenderness on Palpation: Yes Treatment Notes Wound #1 (Sacrum) Cleanser Soap and Water Discharge Instruction: May shower and wash wound with dial antibacterial soap and water prior to dressing change. Wound Cleanser Discharge Instruction: Cleanse the wound with wound cleanser prior to applying a clean dressing using gauze sponges, not tissue or cotton balls. Peri-Wound Care Skin Prep Discharge Instruction: Use skin prep as directed Topical Primary Dressing Dakin's Solution 0.25%, 16 (oz) Discharge Instruction: Moisten gauze with Dakin's solution Plain packing strip 1/2 (in) Discharge Instruction: Lightly  pack as instructed Secondary Dressing ABD Pad, 5x9 Discharge  Instruction: Apply over primary dressing as directed. Secured With 28M Medipore H Soft Cloth Surgical T ape, 4 x 10 (in/yd) Discharge Instruction: Secure with tape as directed. Compression Wrap Compression Stockings Add-Ons Winger, Winston A ([id number]) [phone].pdf Page 9 of 9 Electronic Signature(s) Signed: 11/11/2022 8:39:24 AM By: Rhae Hammock RN Entered By: Rhae Hammock on 11/05/2022 11:32:39 -------------------------------------------------------------------------------- Vitals Details Patient Name: Date of Service: Joe [name], Joe [place] A. 11/05/2022 11:15 A M Medical Record Number: [id number] Patient Account Number: [id number] Date of Birth/Sex: Treating RN: [birth year] (67 y.o. Burnadette Pop, Lauren Primary Care Camara Rosander: Salena Saner Other Clinician: Referring Charman Blasco: Treating Macyn Shropshire/Extender: Ether Griffins in Treatment: 10 Vital Signs Time Taken: 11:23 Temperature (F): 97.9 Height (in): 67 Pulse (bpm): 61 Weight (lbs): 160 Respiratory Rate (breaths/min): 17 Body Mass Index (BMI): 25.1 Blood Pressure (mmHg): 96/60 Reference Range: 80 - 120 mg / dl Electronic Signature(s) Signed: 11/11/2022 8:39:24 AM By: Rhae Hammock RN Entered By: Rhae Hammock on 11/05/2022 11:25:10

## 2022-11-16 NOTE — Progress Notes (Deleted)
PCP:  Willey Blade, MD Primary Cardiologist: Skeet Latch, MD Electrophysiologist: None   Joe [name] is a 67 y.o. 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   atorvastatin (LIPITOR) 80 mg, Oral, Daily   Co Q-10 100 mg, Oral, Daily   Eliquis 5 mg, Oral, 2 times daily   fluticasone (FLONASE) 50 MCG/ACT nasal spray 2 sprays, Each Nare, Daily   furosemide (LASIX) 40 mg, Oral, Daily PRN   leptospermum manuka honey (MEDIHONEY) PSTE paste 1 Application, Topical, Daily   OVER THE COUNTER MEDICATION PRUNE JUICE/FLAXSEED   pantoprazole (PROTONIX) 40 mg, Oral, Daily   potassium chloride SA (KLOR-CON M) 20 MEQ tablet 20 mEq, Oral, As needed   sacubitril-valsartan (ENTRESTO) 24-26 MG 1 tablet, Oral, 2 times daily   spironolactone (ALDACTONE) 12.5 mg, Oral, Daily   tamsulosin (FLOMAX) 0.4 mg, Oral, 2 times daily   traMADol (ULTRAM) 50 mg, Oral, Every 6 hours  PRN   Vitamin D3 10,000 Units, Oral, Daily    Physical Exam: There were no vitals filed for this visit.  GEN- NAD. A&O x 3. Normal affect. HEENT: normocephalic, atraumatic Lungs- CTAB, Normal effort Heart- {EPRHYTHM:28826}, No M/G/R Extremities- {EDEMA LEVEL:28147::"No"} peripheral edema. no clubbing or cyanosis; Skin- warm and dry, no rash or lesion  {EKGtoday:28818::"EKG is not ordered today"}  Additional studies reviewed include: Previous EP notes.   {Select studies to display:26339}  Assessment and Plan:  Chronic systolic CHF LBBB Echo 6/14 EF 25-30%, LV severely dilated, severe AI, Global HK, RV ok  NICM, severity out of proportion to degree of CAD  Likely valvular CM in setting of severe AI +/- LBBB CM  Epicardial CRT wire(s) placed at time of AVR +CABG Plan for eventual CRT-D.   Given complicated post op course with new fib/flutter requiring heparinization and [place] with potential cardioversion, we will plan to follow up as an outpatient for scheduling of his CRT-D implant.    3. Ascending Aortic Aneurysm/ Severe AI  S/p [place] with bioprosthetic aortic valve 9/27 Iintraoperative TEE, with no perivalvular leak post repair    4. CAD s/p CABG x 1, SVG- RCA  Denies s/s ischemia    5. Atrial arrhythmias Intermittent fib/fluter post op now persistent over past several days.  Attempts to pace out were unsuccessful.  Currently on amio gtt and heparin with potential plans  to [facility] prior to d/c.  Would then need [place] to not be interrupted, so as above will likely plan CRT implant as outpatient.   Follow up with {SWHQP:59163} {EPFOLLOW WG:66599}  Shirley Friar, PA-C  11/16/22 9:25 AM

## 2022-11-18 ENCOUNTER — Ambulatory Visit: Payer: Medicare Other | Admitting: Student

## 2022-11-23 ENCOUNTER — Other Ambulatory Visit: Payer: Self-pay

## 2022-11-23 ENCOUNTER — Ambulatory Visit: Payer: Medicare Other | Admitting: Internal Medicine

## 2022-11-23 ENCOUNTER — Encounter: Payer: Self-pay | Admitting: Internal Medicine

## 2022-11-23 VITALS — BP 112/62 | HR 59 | Temp 98.0°F

## 2022-11-23 DIAGNOSIS — S31000D Unspecified open wound of lower back and pelvis without penetration into retroperitoneum, subsequent encounter: Secondary | ICD-10-CM | POA: Diagnosis not present

## 2022-11-23 NOTE — Assessment & Plan Note (Signed)
Patient doing well on current antibiotic regimen of daptomycin and ceftriaxone and will continue x 6 weeks through 12/16/22.  Plan to remove PICC line at that time.  Continue wound care, offloading, nutrition.  Follow up in about 1 month.  He needs some dental work done and I see no reason why this can't be done while on antibiotics.

## 2022-11-23 NOTE — Progress Notes (Signed)
[facility]  CHIEF COMPLAINT:    Follow up for sacral wound and OM  SUBJECTIVE:    Joe [name] is a 67 y.o. male with PMHx as below who presents to the clinic for sacral wound and OM.   Patient is here today for scheduled follow up after initial visit on 10/26/22 for sacral wound and OM.  He had a PICC line placed 11/04/22 and was started on daptomycin and ceftriaxone x 6 weeks from that date ending on 12/16/22.  He has had no issues noted with PICC line or tolerating antibiotics.  He saw Dr Heber Dayton at the wound center 11/05/22.  Review of that note indicates as sacral ulcer that was open with granulation tissue at the opening with undermining for about 5 cm from the 10:00 to the 1 o'clock position.  There was no probing to bone and no signs of overt infection.  His OPAT labs from 11/09/2022 were notable for WBC 6.4, creatinine 0.65, AST 23, ALT 30, ESR 6, CK 53, and CRP less than 1.  He is doing well today.  No issues with PICC line and tolerating antibiotics.  No diarrhea.   Please see A&P for the details of today's visit and status of the patient's medical problems.   Patient's Medications  New Prescriptions   No medications on file  Previous Medications   ACETAMINOPHEN (TYLENOL) 325 MG TABLET    Take 650 mg by mouth every 6 (six) hours as needed for mild pain.   AMIODARONE (PACERONE) 200 MG TABLET    Take 1 tablet (200 mg total) by mouth daily.   APIXABAN (ELIQUIS) 5 MG TABS TABLET    Take 1 tablet (5 mg total) by mouth 2 (two) times daily.   ASPIRIN EC 81 MG TABLET    Take 1 tablet (81 mg total) by mouth daily. Swallow whole.   ATORVASTATIN (LIPITOR) 80 MG TABLET    Take 1 tablet (80 mg total) by mouth daily.   CHOLECALCIFEROL (VITAMIN D3) 250 MCG (10000 UT) CAPSULE    Take 10,000 Units by mouth daily.   COENZYME Q10 (CO Q-10) 100 MG CAPS    Take 100 mg by mouth daily.   FLUTICASONE (FLONASE) 50 MCG/ACT NASAL SPRAY    Place 2 sprays into both  nostrils daily.   FUROSEMIDE (LASIX) 20 MG TABLET    Take 2 tablets (40 mg total) by mouth daily as needed for fluid.   LEPTOSPERMUM MANUKA HONEY (MEDIHONEY) PSTE PASTE    Apply 1 Application topically daily.   OVER THE COUNTER MEDICATION    PRUNE JUICE/FLAXSEED   PANTOPRAZOLE (PROTONIX) 40 MG TABLET    Take 1 tablet (40 mg total) by mouth daily.   POTASSIUM CHLORIDE SA (KLOR-CON M) 20 MEQ TABLET    Take 1 tablet (20 mEq total) by mouth as needed (Take one tab on the days you require lasix).   SACUBITRIL-VALSARTAN (ENTRESTO) 24-26 MG    Take 1 tablet by mouth 2 (two) times daily.   SPIRONOLACTONE (ALDACTONE) 25 MG TABLET    Take 0.5 tablets (12.5 mg total) by mouth daily.   TAMSULOSIN (FLOMAX) 0.4 MG CAPS CAPSULE    Take 0.4 mg by mouth 2 (two) times daily.   TRAMADOL (ULTRAM) 50 MG TABLET    Take 1 tablet (50 mg total) by mouth every 6 (six) hours as needed for moderate pain.  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   Murmur 05/26/2022   On mechanically assisted ventilation (HCC)    Osteoarthritis of left hip 01/16/2014   Osteoarthritis of right hip 05/15/2014   Primary localized osteoarthrosis of left shoulder 09/21/2017   PVC (premature ventricular contraction) 05/26/2022   Right ureteral stone    Speech impediment    Thoracic ascending aortic aneurysm (HCC)    Umbilical hernia     Social History   Tobacco Use   Smoking status: Never   Smokeless tobacco: Never  Vaping Use    Vaping Use: Never used  Substance Use Topics   Alcohol use: No   Drug use: No    Family History  Problem Relation Age of Onset   Heart failure Mother    Atrial fibrillation Mother    Heart failure Maternal Grandmother    Atrial fibrillation Maternal Grandmother     Allergies  Allergen Reactions   Codeine Nausea Only   Levaquin [Levofloxacin In D5w] Diarrhea    Review of Systems  All other systems reviewed and are negative.    OBJECTIVE:    Vitals:   11/23/22 1357  BP: 112/62  Pulse: (!) 59  Temp: 98 F (36.7 C)  TempSrc: Oral  SpO2: 97%   There is no height or weight on file to calculate BMI.  Physical Exam Constitutional:      General: He is not in acute distress.    Appearance: Normal appearance.     Comments: He is with his mother and in great spirits.   HENT:     Head: Normocephalic and atraumatic.  Musculoskeletal:     Comments: He is sitting on an offloading cushion.   Skin:    General: Skin is warm and dry.  Neurological:     General: No focal deficit present.     Mental Status: He is alert and oriented to person, place, and time.  Psychiatric:        Mood and Affect: Mood normal.        Behavior: Behavior normal.      Labs and Microbiology:    Latest Ref Rng & Units 10/26/2022    3:47 PM 09/25/2022    2:20 PM 09/02/2022   10:20 AM  CBC  WBC 3.8 - 10.8 Thousand/uL 6.0  7.3  7.5   Hemoglobin 13.2 - 17.1 g/dL 11.5  11.7  10.9   Hematocrit 38.5 - 50.0 % 33.9  36.4  32.8   Platelets 140 - 400 Thousand/uL 276  318  323       Latest Ref Rng & Units 10/26/2022    3:47 PM 09/28/2022   11:05 AM 09/25/2022    2:20 PM  CMP  Glucose 65 - 99 mg/dL 95  101  109   BUN 7 - 25 mg/dL 31  25  27   $ Creatinine 0.70 - 1.35 mg/dL 0.85  0.75  0.90   Sodium  135 - 146 mmol/L 135  137  136   Potassium 3.5 - 5.3 mmol/L 5.2  4.1  4.7   Chloride 98 - 110 mmol/L 102  104  103   CO2 20 - 32 mmol/L 28  26  23   $ Calcium 8.6 - 10.3 mg/dL 9.3  9.2  9.3   Total  Protein 6.1 - 8.1 g/dL 6.4   6.7   Total Bilirubin 0.2 - 1.2 mg/dL 0.6   0.4   Alkaline Phos 38 - 126 U/L   71   AST 10 - 35 U/L 16   15   ALT 9 - 46 U/L 18   16      No results found for this or any previous visit (from the past 240 hour(s)).    ASSESSMENT & PLAN:    Sacral wound Patient doing well on current antibiotic regimen of daptomycin and ceftriaxone and will continue x 6 weeks through 12/16/22.  Plan to remove PICC line at that time.  Continue wound care, offloading, nutrition.  Follow up in about 1 month.  He needs some dental work done and I see no reason why this can't be done while on antibiotics.     No orders of the defined types were placed in this encounter.      Raynelle Highland for Infectious [facility] 11/23/2022, 2:06 PM  I have personally spent 20 minutes involved in face-to-face and non-face-to-face activities for this patient on the day of the visit. Professional time spent includes the following activities: Preparing to see the patient (review of tests), Obtaining and/or reviewing separately obtained history (admission/discharge record), Performing a medically appropriate examination and/or evaluation , Ordering medications/tests/procedures, referring and communicating with other health care professionals, Documenting clinical information in the EMR, Independently interpreting results (not separately reported), Communicating results to the patient/family/caregiver, Counseling and educating the patient/family/caregiver and Care coordination (not separately reported).

## 2022-11-24 ENCOUNTER — Other Ambulatory Visit (HOSPITAL_BASED_OUTPATIENT_CLINIC_OR_DEPARTMENT_OTHER): Payer: Self-pay

## 2022-11-26 ENCOUNTER — Encounter (HOSPITAL_BASED_OUTPATIENT_CLINIC_OR_DEPARTMENT_OTHER): Payer: Medicare Other | Attending: Internal Medicine | Admitting: Internal Medicine

## 2022-11-26 ENCOUNTER — Other Ambulatory Visit (HOSPITAL_BASED_OUTPATIENT_CLINIC_OR_DEPARTMENT_OTHER): Payer: Self-pay

## 2022-11-26 DIAGNOSIS — I5042 Chronic combined systolic (congestive) and diastolic (congestive) heart failure: Secondary | ICD-10-CM | POA: Diagnosis not present

## 2022-11-26 DIAGNOSIS — L89154 Pressure ulcer of sacral region, stage 4: Secondary | ICD-10-CM | POA: Diagnosis present

## 2022-11-26 DIAGNOSIS — I110 Hypertensive heart disease with heart failure: Secondary | ICD-10-CM | POA: Insufficient documentation

## 2022-11-26 DIAGNOSIS — Z951 Presence of aortocoronary bypass graft: Secondary | ICD-10-CM

## 2022-11-26 DIAGNOSIS — I447 Left bundle-branch block, unspecified: Secondary | ICD-10-CM | POA: Insufficient documentation

## 2022-11-26 DIAGNOSIS — Z953 Presence of xenogenic heart valve: Secondary | ICD-10-CM | POA: Insufficient documentation

## 2022-11-26 DIAGNOSIS — M4628 Osteomyelitis of vertebra, sacral and sacrococcygeal region: Secondary | ICD-10-CM | POA: Diagnosis not present

## 2022-11-26 DIAGNOSIS — I2510 Atherosclerotic heart disease of native coronary artery without angina pectoris: Secondary | ICD-10-CM | POA: Diagnosis not present

## 2022-11-27 ENCOUNTER — Other Ambulatory Visit (HOSPITAL_BASED_OUTPATIENT_CLINIC_OR_DEPARTMENT_OTHER): Payer: Self-pay

## 2022-11-27 MED ORDER — TAMSULOSIN HCL 0.4 MG PO CAPS
0.4000 mg | ORAL_CAPSULE | Freq: Two times a day (BID) | ORAL | 3 refills | Status: DC
  Filled 2022-11-27: qty 60, 30d supply, fill #0
  Filled 2023-01-04: qty 60, 30d supply, fill #1
  Filled 2023-02-01: qty 60, 30d supply, fill #2
  Filled 2023-03-09: qty 60, 30d supply, fill #3

## 2022-11-27 NOTE — Progress Notes (Signed)
Schillaci, Lamberto A [phone]) [phone].pdf Page 1 of 7 Visit Report for 11/26/2022 Chief Complaint Document Details Patient Name: Date of Service: [name], [place] RK A. 11/26/2022 11:00 A M Medical Record Number: [id number] Patient Account Number: [id number] Date of Birth/Sex: Treating RN: 11-29-1955 (67 y.o. M) Primary Care Provider: Salena Saner Other Clinician: Referring Provider: Treating Provider/Extender: Ether Griffins in Treatment: 13 Information Obtained from: Patient Chief Complaint 08/27/2022; patient presents for sacral wound Electronic Signature(s) Signed: 11/26/2022 12:26:40 PM By: Kalman Shan DO Entered By: Kalman Shan on 11/26/2022 11:53:25 -------------------------------------------------------------------------------- HPI Details Patient Name: Date of Service: [name], [place] RK A. 11/26/2022 11:00 A M Medical Record Number: [id number] Patient Account Number: [id number] Date of Birth/Sex: Treating RN: [birth year] (67 y.o. M) Primary Care Provider: Salena Saner Other Clinician: Referring Provider: Treating Provider/Extender: Ether Griffins in Treatment: 8 History of Present Illness HPI Description: 08/27/2022 Mr. Debbie Klunk is a 67 year old male with a past medical history of chronic combined systolic diastolic heart failure, coronary artery disease status post CABG x1, left bundle branch block That presents to the clinic for a sacral ulcer. Patient was admitted to the hospital on 07/08/2022 For 6 weeks and discharged on 08/13/2022 For placement of a bioprosthetic aortic valve due to an ascending aortic aneurysm. During this admission patient states that he developed the sacral ulcer. He is not bedbound and ambulates with a walker. He is currently living with his mother and home health comes out for dressing changes. Currently he is using Medihoney every 3 days.  There is mild odor. Currently denies systemic signs of infection. 11/30; patient presents for follow-up. He has been using Dakin's wet-to-dry dressings mother is present during the encounter. He has no issues or complaints today. He denies signs of infection. 12/14; patient presents for follow-up. He has been using Dakin's wet-to-dry dressings. He has no issues or complaints today. He denies signs of infection. 12/28; patient presents for follow-up. He has been using Dakin's wet-to-dry dressings. He has no issues or complaints today. 1/11; patient presents for follow-up. He had a sacral x-ray done at last clinic visit that showed findings consistent with osteomyelitis to the mid to inferior sacrum. He is scheduled to see infectious disease on 1/15. He has been using Dakin's wet-to-dry dressings. He has chronic pain to the site. He denies systemic signs of infection. 1/25; patient presents for follow-up. He saw Dr. Juleen China with infectious disease on 1/15. Patient was started on a PICC line and plan is for 6 weeks of IV antibiotics. Patient has been using Dakin's wet-to-dry packing strips. 2/15; patient presents for follow-up. He is on week 3 of IV antibiotics. He has been using Dakin's wet-to-dry packing strips. He has no issues or complaints today. Electronic Signature(s) Signed: 11/26/2022 12:26:40 PM By: Kalman Shan DO Entered By: Kalman Shan on 11/26/2022 11:54:02 [place], [place] A ([id number] Page 2 of 7 -------------------------------------------------------------------------------- Physical Exam Details Patient Name: Date of Service: [name], [place] RK A. 11/26/2022 11:00 A M Medical Record Number: [id number] Patient Account Number: [id number] Date of Birth/Sex: Treating RN: June 15, 1956 (67 y.o. M) Primary Care Provider: Salena Saner Other Clinician: Referring Provider: Treating Provider/Extender: Ether Griffins in Treatment: 13 Constitutional respirations regular, non-labored and within target range for patient.. Cardiovascular 2+ dorsalis pedis/posterior tibialis pulses. Psychiatric pleasant and cooperative. Notes Sacral ulcer: Open wound with granulation tissue at the opening. This does undermine significantly for about 4.5 cm from  the 10 to 1 o'clock position. No probing to bone. No signs of overt infection. Electronic Signature(s) Signed: 11/26/2022 12:26:40 PM By: Kalman Shan DO Entered By: Kalman Shan on 11/26/2022 11:54:41 -------------------------------------------------------------------------------- Physician Orders Details Patient Name: Date of Service: [name], [place] RK A. 11/26/2022 11:00 A M Medical Record Number: [id number] Patient Account Number: [id number] Date of Birth/Sex: Treating RN: 06-17-1956 (67 y.o. [place] Noe Primary Care Provider: Salena Saner Other Clinician: Referring Provider: Treating Provider/Extender: Ether Griffins in Treatment: 36 Verbal / Phone Orders: No Diagnosis Coding Follow-up Appointments Return appointment in 3 weeks. - w/ Dr. Heber [place] (any day) Other: - IV antibiotics started today 11/05/22 for 14 day course Anesthetic (In clinic) Topical Lidocaine 5% applied to wound bed Off-Loading Turn and reposition every 2 hours Home Health No change in wound care orders this week; continue Home Health for wound care. May utilize formulary equivalent dressing for wound treatment orders unless otherwise specified. - Advance to change 3x a week. Pt.'s mother will change all other times. Wound Treatment Wound #1 - Sacrum Cleanser: Soap and Water ([place]) 2 x Per [id number] Days Discharge Instructions: May shower and wash wound with dial antibacterial soap and water prior to dressing change. Cleanser: Wound Cleanser (DME) (Home Health) (Generic) 2 x Per Day/15 Days Discharge Instructions: Cleanse  the wound with wound cleanser prior to applying a clean dressing using gauze sponges, not tissue or cotton balls. [place], [place] A [phone]) [phone].pdf Page 3 of 7 Peri-Wound Care: Skin Prep (DME) (Home Health) (Generic) 2 x Per Day/15 Days Discharge Instructions: Use skin prep as directed Prim Dressing: Dakin's Solution 0.25%, 16 (oz) (Home Health) (Generic) 2 x Per Day/15 Days ary Discharge Instructions: Moisten gauze with Dakin's solution Prim Dressing: Plain packing strip 1/2 (in) (DME) (Generic) 2 x Per Day/15 Days ary Discharge Instructions: Lightly pack as instructed Secondary Dressing: Woven Gauze Sponge, Non-Sterile 4x4 in (DME) (Generic) 2 x Per Day/15 Days Discharge Instructions: Apply over primary dressing as directed. Secondary Dressing: Zetuvit Plus Silicone Border Dressing 4x4 (in/in) (DME) (Generic) 2 x Per Day/15 Days Discharge Instructions: Apply silicone border over primary dressing as directed. Electronic Signature(s) Signed: 11/26/2022 12:26:40 PM By: Kalman Shan DO Entered By: Kalman Shan on 11/26/2022 11:54:48 -------------------------------------------------------------------------------- Problem List Details Patient Name: Date of Service: [name], [place] A. 11/26/2022 11:00 A M Medical Record Number: [id number] Patient Account Number: [id number] Date of Birth/Sex: Treating RN: [birth year] (67 y.o. M) Primary Care Provider: Salena Saner Other Clinician: Referring Provider: Treating Provider/Extender: Ether Griffins in Treatment: 13 Active Problems ICD-10 Encounter Code Description Active Date MDM Diagnosis L89.154 Pressure ulcer of sacral region, stage 4 08/27/2022 No Yes I50.42 Chronic combined systolic (congestive) and diastolic (congestive) heart failure 08/27/2022 No Yes Z95.1 Presence of aortocoronary bypass graft 08/27/2022 No Yes M46.28 Osteomyelitis of vertebra, sacral and  sacrococcygeal region 10/22/2022 No Yes Inactive Problems Resolved Problems Electronic Signature(s) Signed: 11/26/2022 12:26:40 PM By: Kalman Shan DO Entered By: Kalman Shan on 11/26/2022 11:53:13 [place], [place] ([id number].pdf Page 4 of 7 -------------------------------------------------------------------------------- Progress Note Details Patient Name: Date of Service: [name], [place] RK A. 11/26/2022 11:00 A M Medical Record Number: [id number] Patient Account Number: [id number] Date of Birth/Sex: Treating RN: March 20, 1956 (67 y.o. M) Primary Care Provider: Salena Saner Other Clinician: Referring Provider: Treating Provider/Extender: Ether Griffins in Treatment: 13 Subjective Chief Complaint Information obtained from Patient 08/27/2022; patient presents for sacral wound History of  Present Illness (HPI) 08/27/2022 Mr. Jemell Schantz is a 67 year old male with a past medical history of chronic combined systolic diastolic heart failure, coronary artery disease status post CABG x1, left bundle branch block That presents to the clinic for a sacral ulcer. Patient was admitted to the hospital on 07/08/2022 For 6 weeks and discharged on 08/13/2022 For placement of a bioprosthetic aortic valve due to an ascending aortic aneurysm. During this admission patient states that he developed the sacral ulcer. He is not bedbound and ambulates with a walker. He is currently living with his mother and home health comes out for dressing changes. Currently he is using Medihoney every 3 days. There is mild odor. Currently denies systemic signs of infection. 11/30; patient presents for follow-up. He has been using Dakin's wet-to-dry dressings mother is present during the encounter. He has no issues or complaints today. He denies signs of infection. 12/14; patient presents for follow-up. He has been using Dakin's wet-to-dry  dressings. He has no issues or complaints today. He denies signs of infection. 12/28; patient presents for follow-up. He has been using Dakin's wet-to-dry dressings. He has no issues or complaints today. 1/11; patient presents for follow-up. He had a sacral x-ray done at last clinic visit that showed findings consistent with osteomyelitis to the mid to inferior sacrum. He is scheduled to see infectious disease on 1/15. He has been using Dakin's wet-to-dry dressings. He has chronic pain to the site. He denies systemic signs of infection. 1/25; patient presents for follow-up. He saw Dr. Juleen China with infectious disease on 1/15. Patient was started on a PICC line and plan is for 6 weeks of IV antibiotics. Patient has been using Dakin's wet-to-dry packing strips. 2/15; patient presents for follow-up. He is on week 3 of IV antibiotics. He has been using Dakin's wet-to-dry packing strips. He has no issues or complaints today. Patient History Family History Heart Disease - Mother,Maternal Grandparents. Social History Never smoker, Alcohol Use - Never, Drug Use - No History, Caffeine Use - Never. Medical History Cardiovascular Patient has history of Coronary Artery Disease, Hypertension Musculoskeletal Patient has history of Osteoarthritis - bilateral hips; left shoulder Hospitalization/Surgery History - cardioversions 07/2022. - CABG 07/08/2022. - hip replacements 2015. - 2018 left shoulder surgery. - epicardial pacing lead placement 07/08/2022. Medical A Surgical History Notes nd Constitutional Symptoms (General Health) cognitive deficits congenital brain damage expressive speech delay Cardiovascular PVC LBBB thoracic ascending aortic aneurysm epicardial pacing lead placement Gastrointestinal umbilical hernia Genitourinary kidney stones Objective Constitutional [place], [place] A ([id number].pdf Page 5 of 7 respirations regular, non-labored and within target  range for patient.. Vitals Time Taken: 11:18 AM, Height: 67 in, Weight: 160 lbs, BMI: 25.1, Temperature: 97.9 F, Pulse: 57 bpm, Respiratory Rate: 20 breaths/min, Blood Pressure: 116/68 mmHg. Cardiovascular 2+ dorsalis pedis/posterior tibialis pulses. Psychiatric pleasant and cooperative. General Notes: Sacral ulcer: Open wound with granulation tissue at the opening. This does undermine significantly for about 4.5 cm from the 10 to 1 o'clock position. No probing to bone. No signs of overt infection. Integumentary (Hair, Skin) Wound #1 status is Open. Original cause of wound was Pressure Injury. The date acquired was: 08/07/2022. The wound has been in treatment 13 weeks. The wound is located on the Sacrum. The wound measures 0.4cm length x 0.3cm width x 1cm depth; 0.094cm^2 area and 0.094cm^3 volume. There is muscle and Fat Layer (Subcutaneous Tissue) exposed. There is no undermining noted, however, there is tunneling at 10:00 with a maximum distance of 4.5cm. There is  a medium amount of serosanguineous drainage noted. The wound margin is distinct with the outline attached to the wound base. There is large (67-100%) red, pink granulation within the wound bed. There is no necrotic tissue within the wound bed. The periwound skin appearance did not exhibit: Callus, Crepitus, Excoriation, Induration, Rash, Scarring, Dry/Scaly, Maceration, Atrophie Blanche, Cyanosis, Ecchymosis, Hemosiderin Staining, Mottled, Pallor, Rubor, Erythema. The periwound has tenderness on palpation. Assessment Active Problems ICD-10 Pressure ulcer of sacral region, stage 4 Chronic combined systolic (congestive) and diastolic (congestive) heart failure Presence of aortocoronary bypass graft Osteomyelitis of vertebra, sacral and sacrococcygeal region Patient's wound has improved by half a centimeter with tunneling. I recommended continuing Dakin's wet-to-dry packing strips and aggressive offloading. He is on week 3 of IV  antibiotics and he has 3 more weeks ago. Will discuss HBO again once patient completes 6 weeks of IV antibiotics and wound still open. Plan Follow-up Appointments: Return appointment in 3 weeks. - w/ Dr. Heber [place] (any day) Other: - IV antibiotics started today 11/05/22 for 14 day course Anesthetic: (In clinic) Topical Lidocaine 5% applied to wound bed Off-Loading: Turn and reposition every 2 hours Home Health: No change in wound care orders this week; continue Home Health for wound care. May utilize formulary equivalent dressing for wound treatment orders unless otherwise specified. - Advance to change 3x a week. Pt.'s mother will change all other times. WOUND #1: - Sacrum Wound Laterality: Cleanser: Soap and Water ([place]) 2 x Per Day/15 Days Discharge Instructions: May shower and wash wound with dial antibacterial soap and water prior to dressing change. Cleanser: Wound Cleanser (DME) (Generic) 2 x Per Day/15 Days Discharge Instructions: Cleanse the wound with wound cleanser prior to applying a clean dressing using gauze sponges, not tissue or cotton balls. Peri-Wound Care: Skin Prep (DME) (Generic) 2 x Per Day/15 Days Discharge Instructions: Use skin prep as directed Prim Dressing: Dakin's Solution 0.25%, 16 (oz) (Home Health) (Generic) 2 x Per Day/15 Days ary Discharge Instructions: Moisten gauze with Dakin's solution Prim Dressing: Plain packing strip 1/2 (in) (DME) (Generic) 2 x Per Day/15 Days ary Discharge Instructions: Lightly pack as instructed Secondary Dressing: Woven Gauze Sponge, Non-Sterile 4x4 in (DME) (Generic) 2 x Per Day/15 Days Discharge Instructions: Apply over primary dressing as directed. Secondary Dressing: Zetuvit Plus Silicone Border Dressing 4x4 (in/in) (DME) (Generic) 2 x Per Day/15 Days Discharge Instructions: Apply silicone border over primary dressing as directed. 1. Dakin's wet-to-dry packing strips 2. Follow-up in 3 weeks 3. Continue IV antibiotics  per ID Electronic Signature(s) Signed: 11/26/2022 12:26:40 PM By: Kalman Shan DO Entered By: Kalman Shan on 11/26/2022 11:57:04 [place]h, [place] A ([id number].pdf Page 6 of 7 -------------------------------------------------------------------------------- HxROS Details Patient Name: Date of Service: [name], [place] RK A. 11/26/2022 11:00 A M Medical Record Number: [id number] Patient Account Number: [id number] Date of Birth/Sex: Treating RN: 10/03/1956 (67 y.o. M) Primary Care Provider: Salena Saner Other Clinician: Referring Provider: Treating Provider/Extender: Ether Griffins in Treatment: 13 Constitutional Symptoms (General Health) Medical History: Past Medical History Notes: cognitive deficits congenital brain damage expressive speech delay Cardiovascular Medical History: Positive for: Coronary Artery Disease; Hypertension Past Medical History Notes: PVC LBBB thoracic ascending aortic aneurysm epicardial pacing lead placement Gastrointestinal Medical History: Past Medical History Notes: umbilical hernia Genitourinary Medical History: Past Medical History Notes: kidney stones Musculoskeletal Medical History: Positive for: Osteoarthritis - bilateral hips; left shoulder Immunizations Pneumococcal Vaccine: Received Pneumococcal Vaccination: No Implantable Devices No devices added Hospitalization / Surgery History  Type of Hospitalization/Surgery cardioversions 07/2022 CABG 07/08/2022 hip replacements 2015 2018 left shoulder surgery epicardial pacing lead placement 07/08/2022 Family and Social History Heart Disease: Yes - Mother,Maternal Grandparents; Never smoker; Alcohol Use: Never; Drug Use: No History; Caffeine Use: Never; Financial Concerns: No; Food, Clothing or Shelter Needs: No; Support System Lacking: No; Transportation Concerns: No Electronic Signature(s) Signed: 11/26/2022 12:26:40  PM By: Kalman Shan DO Entered By: Kalman Shan on 11/26/2022 11:54:18 [place], [place] A ([id number].pdf Page 7 of 7 -------------------------------------------------------------------------------- SuperBill Details Patient Name: Date of Service: [name], [place] A. 11/26/2022 Medical Record Number: [id number] Patient Account Number: [id number] Date of Birth/Sex: Treating RN: [birth year] (67 y.o. [place], [place] Primary Care Provider: Salena Saner Other Clinician: Referring Provider: Treating Provider/Extender: Ether Griffins in Treatment: 13 Diagnosis Coding ICD-10 Codes Code Description [phone] Pressure ulcer of sacral region, stage 4 I50.42 Chronic combined systolic (congestive) and diastolic (congestive) heart failure Z95.1 Presence of aortocoronary bypass graft M46.28 Osteomyelitis of vertebra, sacral and sacrococcygeal region Facility Procedures : CPT4 Code: [id number] Description: [place] VISIT-LEV 3 EST PT Modifier: Quantity: 1 Physician Procedures : CPT4 Code Description Modifier [id number] - WC PHYS LEVEL 3 - EST PT ICD-10 Diagnosis Description L89.154 Pressure ulcer of sacral region, stage 4 I50.42 Chronic combined systolic (congestive) and diastolic (congestive) heart failure Z95.1 Presence  of aortocoronary bypass graft M46.28 Osteomyelitis of vertebra, sacral and sacrococcygeal region Quantity: 1 Electronic Signature(s) Signed: 11/26/2022 12:26:40 PM By: Kalman Shan DO Entered By: Kalman Shan on 11/26/2022 11:57:16

## 2022-11-28 NOTE — Progress Notes (Signed)
Joe [name] ([id number]) [id number].pdf Page 1 of 8 Visit Report for 11/26/2022 Arrival Information Details Patient Name: Date of Service: Joe [name], [place] [name]. 11/26/2022 11:00 [name] Medical Record Number: [id number] Patient Account Number: [id number] Date of Birth/Sex: Treating [name]: Joe [name] (67 y.o. M) Primary Care Joe [name]: Joe [name] Other Clinician: Referring Joe [name]: Treating Joe [name]/Extender: Joe [name] in Treatment: 39 Visit Information History Since Last Visit All ordered tests and consults were completed: No Patient Arrived: Ambulatory Added or deleted any medications: No Arrival Time: 11:17 Any new allergies or adverse reactions: No Accompanied By: wife Had [name] fall or experienced change in No Transfer Assistance: None activities of daily living that may affect Patient Identification Verified: Yes risk of falls: Secondary Verification Process Completed: Yes Signs or symptoms of abuse/neglect since last visito No Patient Requires Transmission-Based Precautions: No Hospitalized since last visit: No Patient Has Alerts: Yes Implantable device outside of the clinic excluding No Patient Alerts: Patient on Blood Thinner cellular tissue based products placed in the center PICC Lamar since last visit: NO BP'S RIGHT ARM Pain Present Now: No Electronic Signature(s) Signed: 11/26/2022 2:34:42 PM By: Joe [name] Entered By: Joe [name] on 11/26/2022 11:18:08 -------------------------------------------------------------------------------- Clinic Level of Care Assessment Details Patient Name: Date of Service: Joe [name], [place] [name]. 11/26/2022 11:00 [name] Medical Record Number: [id number] Patient Account Number: [id number] Date of Birth/Sex: Treating [name]: Joe [name] (67 y.o. Joe [name], Joe [name] Primary Care Joe [name]: Joe [name] Other Clinician: Referring Joe [name]: Treating Joe [name]/Extender: Joe [name] in Treatment: 13 Clinic Level of Care Assessment Items TOOL 4 Quantity Score X- 1 0 Use when only an EandM is performed on FOLLOW-UP visit ASSESSMENTS - Nursing Assessment / Reassessment X- 1 10 Reassessment of Co-morbidities (includes updates in patient status) X- 1 5 Reassessment of Adherence to Treatment Plan ASSESSMENTS - Wound and Skin [name] ssessment / Reassessment X - Simple Wound Assessment / Reassessment - one wound 1 5 []$  - 0 Complex Wound Assessment / Reassessment - multiple wounds []$  - 0 Dermatologic / Skin Assessment (not related to wound area) ASSESSMENTS - Focused Assessment []$  - 0 Circumferential Edema Measurements - multi extremities []$  - 0 Nutritional Assessment / Counseling / Intervention Joe [name] ([id number].pdf Page 2 of 8 []$  - 0 Lower Extremity Assessment (monofilament, tuning fork, pulses) []$  - 0 Peripheral Arterial Disease Assessment (using hand held doppler) ASSESSMENTS - Ostomy and/or Continence Assessment and Care []$  - 0 Incontinence Assessment and Management []$  - 0 Ostomy Care Assessment and Management (repouching, etc.) PROCESS - Coordination of Care X - Simple Patient / Family Education for ongoing care 1 15 []$  - 0 Complex (extensive) Patient / Family Education for ongoing care X- 1 10 Staff obtains Programmer, systems, Records, T Results / Process Orders est []$  - 0 Staff telephones HHA, Nursing Homes / Clarify orders / etc []$  - 0 Routine Transfer to another Facility (non-emergent condition) []$  - 0 Routine Hospital Admission (non-emergent condition) []$  - 0 New Admissions / Biomedical engineer / Ordering NPWT Apligraf, etc. , []$  - 0 Emergency Hospital Admission (emergent condition) X- 1 10 Simple Discharge Coordination []$  - 0 Complex (extensive) Discharge Coordination PROCESS - Special Needs []$  - 0 Pediatric / Minor Patient Management []$  - 0 Isolation Patient  Management []$  - 0 Hearing / Language / Visual special needs []$  - 0 Assessment of Community assistance (transportation, D/C planning, etc.) []$  - 0 Additional assistance / Altered mentation []$  -  0 Support Surface(s) Assessment (bed, cushion, seat, etc.) INTERVENTIONS - Wound Cleansing / Measurement X - Simple Wound Cleansing - one wound 1 5 []$  - 0 Complex Wound Cleansing - multiple wounds X- 1 5 Wound Imaging (photographs - any number of wounds) []$  - 0 Wound Tracing (instead of photographs) []$  - 0 Simple Wound Measurement - one wound []$  - 0 Complex Wound Measurement - multiple wounds INTERVENTIONS - Wound Dressings X - Small Wound Dressing one or multiple wounds 1 10 []$  - 0 Medium Wound Dressing one or multiple wounds []$  - 0 Large Wound Dressing one or multiple wounds X- 1 5 Application of Medications - topical []$  - 0 Application of Medications - injection INTERVENTIONS - Miscellaneous []$  - 0 External ear exam []$  - 0 Specimen Collection (cultures, biopsies, blood, body fluids, etc.) []$  - 0 Specimen(s) / Culture(s) sent or taken to Lab for analysis []$  - 0 Patient Transfer (multiple staff / Civil Service fast streamer / Similar devices) []$  - 0 Simple Staple / Suture removal (25 or less) []$  - 0 Complex Staple / Suture removal (26 or more) []$  - 0 Hypo / Hyperglycemic Management (close monitor of Blood Glucose) [name], Kel [name] ([id number] Page 3 of 8 []$  - 0 Ankle / Brachial Index (ABI) - do not check if billed separately X- 1 5 Vital Signs Has the patient been seen at the hospital within the last three years: Yes Total Score: 85 Level Of Care: New/Established - Level 3 Electronic Signature(s) Signed: 11/27/2022 12:08:46 PM By: Joe [name] Entered By: Joe Hammock on 11/26/2022 11:53:28 -------------------------------------------------------------------------------- Encounter Discharge Information Details Patient Name: Date of  Service: Joe Herald, [place] [name]. 11/26/2022 11:00 [name] Medical Record Number: [id number] Patient Account Number: [id number] Date of Birth/Sex: Treating [name]: Joe [name] (67 y.o. Joe [name], Joe [name] Primary Care Raeshawn Tafolla: Joe [name] Other Clinician: Referring [name]: Treating Kymber Kosar/Extender: Joe [name] in Treatment: 13 Encounter Discharge Information Items Discharge Condition: Stable Ambulatory Status: Ambulatory Discharge Destination: Home Transportation: Private Auto Accompanied By: mom Schedule Follow-up Appointment: Yes Clinical Summary of Care: Patient Declined Electronic Signature(s) Signed: 11/27/2022 12:08:46 PM By: Joe [name] Entered By: Joe Hammock on 11/26/2022 11:53:59 -------------------------------------------------------------------------------- Lower Extremity Assessment Details Patient Name: Date of Service: Joe Herald, [place] [name]. 11/26/2022 11:00 [name] Medical Record Number: [id number] Patient Account Number: [id number] Date of Birth/Sex: Treating [name]: [birth year] (67 y.o. Erie [name] Primary Care Deyci Gesell: Joe [name] Other Clinician: Referring Nilani Hugill: Treating Damond Borchers/Extender: Joe [name] in Treatment: 13 Electronic Signature(s) Signed: 11/27/2022 12:08:46 PM By: Joe [name] Entered By: Joe Hammock on 11/26/2022 11:37:29 -------------------------------------------------------------------------------- Multi Wound Chart Details Patient Name: Date of Service: Joe [name], [place] [name]. 11/26/2022 11:00 [name] Medical Record Number: [id number] Patient Account Number: [id number] Tith, Caiden [name] ([id number]) [phone].pdf Page 4 of 8 Date of Birth/Sex: Treating [name]: [birth year][name] (67 y.o. M) Primary Care Brittnye Josephs: Other Clinician: Salena [name] Referring Aamira Bischoff: Treating Laysa Kimmey/Extender: Joe [name] in  Treatment: 13 Vital Signs Height(in): 67 Pulse(bpm): 29 Weight(lbs): 160 Blood Pressure(mmHg): 116/68 Body Mass Index(BMI): 25.1 Temperature(F): 97.9 Respiratory Rate(breaths/min): 20 [1:Photos:] [N/[name]:N/[name]] Sacrum N/[name] N/[name] Wound Location: Pressure Injury N/[name] N/[name] Wounding Event: Pressure Ulcer N/[name] N/[name] Primary Etiology: Coronary Artery Disease, N/[name] N/[name] Comorbid History: Hypertension, Osteoarthritis 08/07/2022 N/[name] N/[name] Date Acquired: 13 N/[name] N/[name] Weeks of Treatment: Open N/[name] N/[name] Wound Status: No N/[name] N/[name] Wound Recurrence: 0.4x0.3x1 N/[name] N/[name] Measurements L x W x D (cm) 0.094  N/[name] N/[name] [name] (cm) : rea 0.094 N/[name] N/[name] Volume (cm) : 99.50% N/[name] N/[name] % Reduction in [name] rea: 99.50% N/[name] N/[name] % Reduction in Volume: 10 Position 1 (o'clock): 4.5 Maximum Distance 1 (cm): Yes N/[name] N/[name] Tunneling: Category/Stage IV N/[name] N/[name] Classification: Medium N/[name] N/[name] Exudate [name] mount: Serosanguineous N/[name] N/[name] Exudate Type: red, brown N/[name] N/[name] Exudate Color: Distinct, outline attached N/[name] N/[name] Wound Margin: Large (67-100%) N/[name] N/[name] Granulation [name] mount: Red, Pink N/[name] N/[name] Granulation Quality: None Present (0%) N/[name] N/[name] Necrotic [name] mount: Fat Layer (Subcutaneous Tissue): Yes N/[name] N/[name] Exposed Structures: Muscle: Yes Fascia: No Tendon: No Joint: No Bone: No Small (1-33%) N/[name] N/[name] Epithelialization: Excoriation: No N/[name] N/[name] Periwound Skin Texture: Induration: No Callus: No Crepitus: No Rash: No Scarring: No Maceration: No N/[name] N/[name] Periwound Skin Moisture: Dry/Scaly: No Atrophie Blanche: No N/[name] N/[name] Periwound Skin Color: Cyanosis: No Ecchymosis: No Erythema: No Hemosiderin Staining: No Mottled: No Pallor: No Rubor: No Yes N/[name] N/[name] Tenderness on Palpation: Treatment Notes Electronic Signature(s) Signed: 11/26/2022 12:26:40 PM By: Kalman Shan DO Entered By: Kalman Shan on 11/26/2022 11:53:17 Tobey, Emad [name] ([id number] Page 5 of  8 -------------------------------------------------------------------------------- Multi-Disciplinary Care Plan Details Patient Name: Date of Service: Joe [name], [place] [name]. 11/26/2022 11:00 [name] Medical Record Number: [id number] Patient Account Number: [id number] Date of Birth/Sex: Treating [name]: [birth year] (67 y.o. Joe [name], Joe [name] Primary Care Laretta Pyatt: Joe [name] Other Clinician: Referring Franklin Clapsaddle: Treating Solash Tullo/Extender: Joe [name] in Treatment: 13 Active Inactive Wound/Skin Impairment Nursing Diagnoses: Impaired tissue integrity Knowledge deficit related to ulceration/compromised skin integrity Goals: Patient will have [name] decrease in wound volume by X% from date: (specify in notes) Date Initiated: 08/27/2022 Target Resolution Date: 12/12/2022 Goal Status: Active Patient/caregiver will verbalize understanding of skin care regimen Date Initiated: 08/27/2022 Target Resolution Date: 12/12/2022 Goal Status: Active Ulcer/skin breakdown will have [name] volume reduction of 30% by week 4 Date Initiated: 08/27/2022 Target Resolution Date: 11/14/2022 Goal Status: Active Interventions: Assess patient/caregiver ability to obtain necessary supplies Assess patient/caregiver ability to perform ulcer/skin care regimen upon admission and as needed Assess ulceration(s) every visit Notes: Electronic Signature(s) Signed: 11/27/2022 12:08:46 PM By: Joe [name] Entered By: Joe Hammock on 11/26/2022 11:51:06 -------------------------------------------------------------------------------- Pain Assessment Details Patient Name: Date of Service: Joe [name], [place] [name]. 11/26/2022 11:00 [name] Medical Record Number: [id number] Patient Account Number: [id number] Date of Birth/Sex: Treating [name]: [name][birth year] (67 y.o. M) Primary Care Presley Gora: Joe [name] Other Clinician: Referring Purl Claytor: Treating Elody Kleinsasser/Extender: Joe [name] in Treatment: 13 Active Problems Location of Pain Severity and Description of Pain Patient Has Paino No Site Locations [employer], [place] [name] ([id number]) [phone].pdf Page 6 of 8 Pain Management and Medication Current Pain Management: Electronic Signature(s) Signed: 11/26/2022 2:34:42 PM By: Joe [name] Entered By: Joe [name] on 11/26/2022 11:18:41 -------------------------------------------------------------------------------- Patient/Caregiver Education Details Patient Name: Date of Service: Joe Herald, [place] [name]. 2/15/2024andnbsp11:00 [name] Medical Record Number: [id number] Patient Account Number: [id number] Date of Birth/Gender: Treating [name]: [name][birth year] (67 y.o. Erie [name] Primary Care Physician: Joe [name] Other Clinician: Referring Physician: Treating Physician/Extender: Joe [name] in Treatment: 13 Education Assessment Education Provided To: Patient Education Topics Provided Wound/Skin Impairment: Methods: Explain/Verbal Responses: Reinforcements needed, State content correctly Electronic Signature(s) Signed: 11/27/2022 12:08:46 PM By: Joe [name] Entered By: Joe Hammock on 11/26/2022 11:51:16 -------------------------------------------------------------------------------- Wound Assessment Details Patient Name: Date of Service: Joe Herald, MA [place] [name]. 11/26/2022 11:00 [name] Medical Record  Number: [id number] Patient Account Number: [id number] Date of Birth/Sex: Treating [name]: [birth year][name] (67 y.o. Joe [name], Joe [name] Primary Care Shamell Suarez: Joe [name] Other Clinician: Referring Petros Ahart: Treating Todd Argabright/Extender: Christian Mate [place], [place] ([id number]) [phone].pdf Page 7 of 8 Weeks in Treatment: 13 Wound Status Wound Number: 1 Primary Etiology: Pressure Ulcer Wound Location: Sacrum Wound Status: Open Wounding Event: Pressure  Injury Comorbid History: Coronary Artery Disease, Hypertension, Osteoarthritis Date Acquired: 08/07/2022 Weeks Of Treatment: 13 Clustered Wound: No Photos Wound Measurements Length: (cm) 0.4 Width: (cm) 0.3 Depth: (cm) 1 Area: (cm) 0.094 Volume: (cm) 0.094 % Reduction in Area: 99.5% % Reduction in Volume: 99.5% Epithelialization: Small (1-33%) Tunneling: Yes Position (o'clock): 10 Maximum Distance: (cm) 4.5 Undermining: No Wound Description Classification: Category/Stage IV Wound Margin: Distinct, outline attached Exudate Amount: Medium Exudate Type: Serosanguineous Exudate Color: red, brown Foul Odor After Cleansing: No Slough/Fibrino No Wound Bed Granulation Amount: Large (67-100%) Exposed Structure Granulation Quality: Red, Pink Fascia Exposed: No Necrotic Amount: None Present (0%) Fat Layer (Subcutaneous Tissue) Exposed: Yes Tendon Exposed: No Muscle Exposed: Yes Necrosis of Muscle: Yes Joint Exposed: No Bone Exposed: No Periwound Skin Texture Texture Color No Abnormalities Noted: No No Abnormalities Noted: No Callus: No Atrophie Blanche: No Crepitus: No Cyanosis: No Excoriation: No Ecchymosis: No Induration: No Erythema: No Rash: No Hemosiderin Staining: No Scarring: No Mottled: No Pallor: No Moisture Rubor: No No Abnormalities Noted: No Dry / Scaly: No Temperature / Pain Maceration: No Tenderness on Palpation: Yes Treatment Notes Wound #1 (Sacrum) Cleanser Soap and Water Discharge Instruction: May shower and wash wound with dial antibacterial soap and water prior to dressing change. Wound Cleanser Justice, [place] [name] ([id number]) [id number].pdf Page 8 of 8 Discharge Instruction: Cleanse the wound with wound cleanser prior to applying [name] clean dressing using gauze sponges, not tissue or cotton balls. Peri-Wound Care Skin Prep Discharge Instruction: Use skin prep as directed Topical Primary Dressing Dakin's Solution  0.25%, 16 (oz) Discharge Instruction: Moisten gauze with Dakin's solution Plain packing strip 1/2 (in) Discharge Instruction: Lightly pack as instructed Secondary Dressing Woven Gauze Sponge, Non-Sterile 4x4 in Discharge Instruction: Apply over primary dressing as directed. Zetuvit Plus Silicone Border Dressing 4x4 (in/in) Discharge Instruction: Apply silicone border over primary dressing as directed. Secured With Compression Wrap Compression Stockings Environmental education officer) Signed: 11/27/2022 12:08:46 PM By: Joe [name] Entered By: Joe Hammock on 11/26/2022 11:49:54 -------------------------------------------------------------------------------- Vitals Details Patient Name: Date of Service: Joe [name], [place] [name]. 11/26/2022 11:00 [name] Medical Record Number: [id number] Patient Account Number: [id number] Date of Birth/Sex: Treating [name]: 04[birth year][name] (67 y.o. M) Primary Care Samual Beals: Joe [name] Other Clinician: Referring Malarie Tappen: Treating Lucien Budney/Extender: Joe [name] in Treatment: 13 Vital Signs Time Taken: 11:18 Temperature (F): 97.9 Height (in): 67 Pulse (bpm): 57 Weight (lbs): 160 Respiratory Rate (breaths/min): 20 Body Mass Index (BMI): 25.1 Blood Pressure (mmHg): 116/68 Reference Range: 80 - 120 mg / dl Electronic Signature(s) Signed: 11/26/2022 2:34:42 PM By: Joe [name] Entered By: Joe [name] on 11/26/2022 11:18:32

## 2022-11-30 NOTE — Progress Notes (Unsigned)
PCP:  Willey Blade, MD Primary Cardiologist: Skeet Latch, MD Electrophysiologist: Vickie Epley, MD   Joe [name] is a 67 y.o. male seen today for Vickie Epley, MD for routine electrophysiology followup. Since last being seen in our clinic the patient reports doing well overall.  He is still being treated for his sacral wound. Has PICC line in and finished IV ABX 12/16/2022. He is working out riding a recumbent bike  he denies chest pain, palpitations, dyspnea, PND, orthopnea, nausea, vomiting, dizziness, syncope, edema, weight gain, or early satiety.   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   atorvastatin (LIPITOR) 80 mg, Oral, Daily   Co Q-10 100 mg, Oral, Daily   Eliquis 5 mg, Oral, 2 times daily   fluticasone (FLONASE) 50 MCG/ACT nasal spray 2 sprays, Each Nare, Daily   furosemide (LASIX) 40 mg, Oral, Daily PRN   OVER THE COUNTER MEDICATION PRUNE JUICE/FLAXSEED   potassium chloride SA  (KLOR-CON M) 20 MEQ tablet 20 mEq, Oral, As needed   sacubitril-valsartan (ENTRESTO) 24-26 MG 1 tablet, Oral, 2 times daily   spironolactone (ALDACTONE) 12.5 mg, Oral, Daily   tamsulosin (FLOMAX) 0.4 mg, Oral, 2 times daily   Vitamin D3 10,000 Units, Oral, Daily    Physical Exam: Vitals:   12/01/22 0915  BP: 103/62  Pulse: (!) 53  Weight: 159 lb (72.1 kg)  Height: 5' 7"$  (1.702 m)    GEN- NAD. A&O x 3. Normal affect. HEENT: normocephalic, atraumatic Lungs- CTAB, Normal effort Heart- Regular rate and rhythm, No M/G/R Extremities- No peripheral edema. no clubbing or cyanosis; Skin- warm and dry, no rash or lesion  EKG is ordered today. Personal review shows Junctional rhythm at 53 bpm, LBBB escape 184 ms  Additional studies reviewed include: Previous EP notes.   Assessment and Plan:  Chronic systolic CHF LBBB Repeat Echo 08/2022 with EF still down <20% LV severely dilated, stable AVR Global HK, RV mildly reduced NICM, severity out of proportion to degree of CAD  Likely valvular CM in setting of severe AI +/- LBBB CM  Epicardial CRT wire(s) placed at time of AVR +CABG [place] for eventual CRT-D. Inpatient implantation was deferred due to new fib/flutter, heparin, and [place] -> DCC.  Was again deferred as outpatient due to multiple open wounds.  Currently he remains actively infected with a PICC line slated to complete ABx  12/16/2022.    3. Ascending Aortic Aneurysm/ Severe AI  S/p [place] with bioprosthetic aortic valve 9/27 Iintraoperative TEE, no perivalvular leak post repair    4. CAD s/p CABG x 1, SVG- RCA  Per primary    5. Anemia 6. Thrombocytopenia PLT  288 (10/06 0511) HGB  9.0* (10/06 0511)   7. Atrial arrhythmias EKG today shows junctional rhythm and no readily visible p waves.  Continue amiodarone 200 mg daily.  Continue eliquis 5 mg daily  Follow up with Dr. Quentin Ore 2-3 months to discuss CRT candidacy.  Shirley Friar, PA-C  12/01/22 9:29 AM

## 2022-11-30 NOTE — Progress Notes (Signed)
Schwenn, Danarius A ([id number]) [id number] Page 1 of 8 Visit Report for 10/22/2022 Arrival Information Details Patient Name: Date of Service: Joe [name], [place] RK A. 10/22/2022 9:15 A M Medical Record Number: [id number] Patient Account Number: [id number] Date of Birth/Sex: Treating RN: 09-28-1956 (67 y.o. M) Primary Care Sherill Wegener: Salena Saner Other Clinician: Referring Imir Brumbach: Treating Azarion Hove/Extender: Ether Griffins in Treatment: 8 Visit Information History Since Last Visit Added or deleted any medications: No Patient Arrived: Ambulatory Any new allergies or adverse reactions: No Arrival Time: 09:20 Had a fall or experienced change in No Transfer Assistance: None activities of daily living that may affect Patient Identification Verified: Yes risk of falls: Secondary Verification Process Completed: Yes Signs or symptoms of abuse/neglect since last visito No Patient Requires Transmission-Based Precautions: No Hospitalized since last visit: No Patient Has Alerts: Yes Implantable device outside of the clinic excluding No Patient Alerts: Patient on Blood Thinner cellular tissue based products placed in the center since last visit: Has Dressing in Place as Prescribed: Yes Pain Present Now: Yes Electronic Signature(s) Signed: 10/23/2022 11:33:40 AM By: Erenest Blank Entered By: Erenest Blank on 10/22/2022 09:20:50 -------------------------------------------------------------------------------- Clinic Level of Care Assessment Details Patient Name: Date of Service: Joe [name], [place] RK A. 10/22/2022 9:15 A M Medical Record Number: [id number] Patient Account Number: [id number] Date of Birth/Sex: Treating RN: 08/11/1956 (67 y.o. Burnadette Pop, Lauren Primary Care Jahnessa Vanduyn: Salena Saner Other Clinician: Referring Linzi Ohlinger: Treating Mica Ramdass/Extender: Ether Griffins in Treatment: 8 Clinic Level of  Care Assessment Items TOOL 4 Quantity Score X- 1 0 Use when only an EandM is performed on FOLLOW-UP visit ASSESSMENTS - Nursing Assessment / Reassessment X- 1 10 Reassessment of Co-morbidities (includes updates in patient status) X- 1 5 Reassessment of Adherence to Treatment Plan ASSESSMENTS - Wound and Skin A ssessment / Reassessment X - Simple Wound Assessment / Reassessment - one wound 1 5 []$  - 0 Complex Wound Assessment / Reassessment - multiple wounds []$  - 0 Dermatologic / Skin Assessment (not related to wound area) ASSESSMENTS - Focused Assessment []$  - 0 Circumferential Edema Measurements - multi extremities []$  - 0 Nutritional Assessment / Counseling / Intervention Lantis, Nisaiah A ([id number]) [id number] Page 2 of 8 []$  - 0 Lower Extremity Assessment (monofilament, tuning fork, pulses) []$  - 0 Peripheral Arterial Disease Assessment (using hand held doppler) ASSESSMENTS - Ostomy and/or Continence Assessment and Care []$  - 0 Incontinence Assessment and Management []$  - 0 Ostomy Care Assessment and Management (repouching, etc.) PROCESS - Coordination of Care []$  - 0 Simple Patient / Family Education for ongoing care X- 1 20 Complex (extensive) Patient / Family Education for ongoing care X- 1 10 Staff obtains Programmer, systems, Records, T Results / Process Orders est []$  - 0 Staff telephones HHA, Nursing Homes / Clarify orders / etc []$  - 0 Routine Transfer to another Facility (non-emergent condition) []$  - 0 Routine Hospital Admission (non-emergent condition) []$  - 0 New Admissions / Biomedical engineer / Ordering NPWT Apligraf, etc. , []$  - 0 Emergency Hospital Admission (emergent condition) []$  - 0 Simple Discharge Coordination X- 1 15 Complex (extensive) Discharge Coordination PROCESS - Special Needs []$  - 0 Pediatric / Minor Patient Management []$  - 0 Isolation Patient Management []$  - 0 Hearing / Language / Visual special needs []$  -  0 Assessment of Community assistance (transportation, D/C planning, etc.) []$  - 0 Additional assistance / Altered mentation []$  - 0 Support Surface(s) Assessment (bed, cushion, seat, etc.) INTERVENTIONS - Wound Cleansing /  Measurement X - Simple Wound Cleansing - one wound 1 5 []$  - 0 Complex Wound Cleansing - multiple wounds X- 1 5 Wound Imaging (photographs - any number of wounds) []$  - 0 Wound Tracing (instead of photographs) X- 1 5 Simple Wound Measurement - one wound []$  - 0 Complex Wound Measurement - multiple wounds INTERVENTIONS - Wound Dressings X - Small Wound Dressing one or multiple wounds 1 10 []$  - 0 Medium Wound Dressing one or multiple wounds []$  - 0 Large Wound Dressing one or multiple wounds X- 1 5 Application of Medications - topical []$  - 0 Application of Medications - injection INTERVENTIONS - Miscellaneous []$  - 0 External ear exam []$  - 0 Specimen Collection (cultures, biopsies, blood, body fluids, etc.) []$  - 0 Specimen(s) / Culture(s) sent or taken to Lab for analysis []$  - 0 Patient Transfer (multiple staff / Civil Service fast streamer / Similar devices) []$  - 0 Simple Staple / Suture removal (25 or less) []$  - 0 Complex Staple / Suture removal (26 or more) []$  - 0 Hypo / Hyperglycemic Management (close monitor of Blood Glucose) Kaczynski, Sherrod A ([id number]) [id number] Page 3 of 8 []$  - 0 Ankle / Brachial Index (ABI) - do not check if billed separately X- 1 5 Vital Signs Has the patient been seen at the hospital within the last three years: Yes Total Score: 100 Level Of Care: New/Established - Level 3 Electronic Signature(s) Signed: 11/30/2022 10:40:58 AM By: Rhae Hammock RN Entered By: Rhae Hammock on 10/22/2022 09:48:38 -------------------------------------------------------------------------------- Encounter Discharge Information Details Patient Name: Date of Service: Joe Herald, [name] 10/22/2022 9:15 A M Medical Record  Number: [id number] Patient Account Number: [id number] Date of Birth/Sex: Treating RN: Feb 17, 1956 (67 y.o. Burnadette Pop, Lauren Primary Care Shayra Anton: Salena Saner Other Clinician: Referring Hayla Hinger: Treating Marcellas Marchant/Extender: Ether Griffins in Treatment: 8 Encounter Discharge Information Items Discharge Condition: Stable Ambulatory Status: Ambulatory Discharge Destination: Home Transportation: Private Auto Accompanied By: MOM Schedule Follow-up Appointment: Yes Clinical Summary of Care: Patient Declined Electronic Signature(s) Signed: 11/30/2022 10:40:58 AM By: Rhae Hammock RN Entered By: Rhae Hammock on 10/22/2022 09:49:18 -------------------------------------------------------------------------------- Lower Extremity Assessment Details Patient Name: Date of Service: Joe Herald, [name] 10/22/2022 9:15 A M Medical Record Number: [id number] Patient Account Number: [id number] Date of Birth/Sex: Treating RN: 1956/08/23 (67 y.o. M) Primary Care Ivey Nembhard: Salena Saner Other Clinician: Referring Ragan Duhon: Treating Jacyln Carmer/Extender: Ether Griffins in Treatment: 8 Electronic Signature(s) Signed: 10/23/2022 11:33:40 AM By: Erenest Blank Entered By: Erenest Blank on 10/22/2022 09:21:58 -------------------------------------------------------------------------------- Multi Wound Chart Details Patient Name: Date of Service: Joe [name], [facility] A. 10/22/2022 9:15 A M Medical Record Number: [id number] Patient Account Number: [id number] VEDH, BRITO A ([id number]) [id number] Page 4 of 8 Date of Birth/Sex: Treating RN: 01-Oct-1956 (67 y.o. M) Primary Care Amorie Rentz: Other Clinician: Salena Saner Referring Danai Gotto: Treating Ryin Schillo/Extender: Ether Griffins in Treatment: 8 Vital Signs Height(in): 67 Pulse(bpm): 75 Weight(lbs): 160 Blood  Pressure(mmHg): 19/61 Body Mass Index(BMI): 25.1 Temperature(F): 97.7 Respiratory Rate(breaths/min): 18 [1:Photos:] [N/A:N/A] Sacrum N/A N/A Wound Location: Pressure Injury N/A N/A Wounding Event: Pressure Ulcer N/A N/A Primary Etiology: Coronary Artery Disease, N/A N/A Comorbid History: Hypertension, Osteoarthritis 08/07/2022 N/A N/A Date Acquired: 8 N/A N/A Weeks of Treatment: Open N/A N/A Wound Status: No N/A N/A Wound Recurrence: 0.8x0.4x0.7 N/A N/A Measurements L x W x D (cm) 0.251 N/A N/A A (cm) : rea 0.176 N/A N/A Volume (cm) : 98.50% N/A N/A %  Reduction in A rea: 99.20% N/A N/A % Reduction in Volume: 12 Position 1 (o'clock): 5.2 Maximum Distance 1 (cm): Yes N/A N/A Tunneling: Category/Stage IV N/A N/A Classification: Medium N/A N/A Exudate A mount: Serosanguineous N/A N/A Exudate Type: red, brown N/A N/A Exudate Color: Distinct, outline attached N/A N/A Wound Margin: Large (67-100%) N/A N/A Granulation A mount: Red, Pink N/A N/A Granulation Quality: None Present (0%) N/A N/A Necrotic A mount: Fat Layer (Subcutaneous Tissue): Yes N/A N/A Exposed Structures: Muscle: Yes Fascia: No Tendon: No Joint: No Bone: No Small (1-33%) N/A N/A Epithelialization: Excoriation: No N/A N/A Periwound Skin Texture: Induration: No Callus: No Crepitus: No Rash: No Scarring: No Maceration: No N/A N/A Periwound Skin Moisture: Dry/Scaly: No Atrophie Blanche: No N/A N/A Periwound Skin Color: Cyanosis: No Ecchymosis: No Erythema: No Hemosiderin Staining: No Mottled: No Pallor: No Rubor: No Yes N/A N/A Tenderness on Palpation: Treatment Notes Wound #1 (Sacrum) Cleanser Soap and Water Discharge Instruction: May shower and wash wound with dial antibacterial soap and water prior to dressing change. Briguglio, Linard A ([id number]) [id number] Page 5 of 8 Wound Cleanser Discharge Instruction: Cleanse the wound with wound  cleanser prior to applying a clean dressing using gauze sponges, not tissue or cotton balls. Peri-Wound Care Skin Prep Discharge Instruction: Use skin prep as directed Topical Primary Dressing Dakin's Solution 0.25%, 16 (oz) Discharge Instruction: Moisten gauze with Dakin's solution Plain packing strip 1/2 (in) Discharge Instruction: Lightly pack as instructed Secondary Dressing ABD Pad, 5x9 Discharge Instruction: Apply over primary dressing as directed. Secured With 45M Medipore H Soft Cloth Surgical T ape, 4 x 10 (in/yd) Discharge Instruction: Secure with tape as directed. Compression Wrap Compression Stockings Add-Ons Electronic Signature(s) Signed: 10/22/2022 10:54:46 AM By: Kalman Shan DO Entered By: Kalman Shan on 10/22/2022 09:59:57 -------------------------------------------------------------------------------- Multi-Disciplinary Care Plan Details Patient Name: Date of Service: Joe Herald, [name] 10/22/2022 9:15 A M Medical Record Number: [id number] Patient Account Number: [id number] Date of Birth/Sex: Treating RN: [birth year] (67 y.o. Burnadette Pop, Lauren Primary Care Quadre Bristol: Salena Saner Other Clinician: Referring Taleisha Kaczynski: Treating Avary Eichenberger/Extender: Ether Griffins in Treatment: 8 Active Inactive Wound/Skin Impairment Nursing Diagnoses: Impaired tissue integrity Knowledge deficit related to ulceration/compromised skin integrity Goals: Patient will have a decrease in wound volume by X% from date: (specify in notes) Date Initiated: 08/27/2022 Target Resolution Date: 10/10/2022 Goal Status: Active Patient/caregiver will verbalize understanding of skin care regimen Date Initiated: 08/27/2022 Target Resolution Date: 10/10/2022 Goal Status: Active Ulcer/skin breakdown will have a volume reduction of 30% by week 4 Date Initiated: 08/27/2022 Target Resolution Date: 10/10/2022 Goal Status: Active Interventions: Assess  patient/caregiver ability to obtain necessary supplies Assess patient/caregiver ability to perform ulcer/skin care regimen upon admission and as needed Randalia, Gray ([id number]) [id number] Page 6 of 8 Assess ulceration(s) every visit Notes: Electronic Signature(s) Signed: 11/30/2022 10:40:58 AM By: Rhae Hammock RN Entered By: Rhae Hammock on 10/22/2022 09:47:54 -------------------------------------------------------------------------------- Pain Assessment Details Patient Name: Date of Service: Joe Herald, [name] 10/22/2022 9:15 A M Medical Record Number: [id number] Patient Account Number: [id number] Date of Birth/Sex: Treating RN: 14-Mar-1956 (67 y.o. M) Primary Care Delight Bickle: Salena Saner Other Clinician: Referring Brent Noto: Treating Kerria Sapien/Extender: Ether Griffins in Treatment: 8 Active Problems Location of Pain Severity and Description of Pain Patient Has Paino Yes Site Locations Pain Location: Pain in Ulcers Rate the pain. Current Pain Level: 5 Pain Management and Medication Current Pain Management: Electronic Signature(s) Signed: 10/23/2022 11:33:40 AM By: Erenest Blank  Entered By: Erenest Blank on 10/22/2022 09:21:51 -------------------------------------------------------------------------------- Patient/Caregiver Education Details Patient Name: Date of Service: Joe [name], [place] RK A. 1/11/2024andnbsp9:15 A M Medical Record Number: [id number] Patient Account Number: [id number] Date of Birth/Gender: Treating RN: 1956/05/14 (67 y.o. Erie Noe Primary Care Physician: Salena Saner Other Clinician: Referring Physician: Treating Physician/Extender: Ether Griffins in Treatment: 8 Education Assessment Marquard, [place] A ([id number]) [id number] Page 7 of 8 Education Provided To: Patient Education Topics Provided Wound/Skin  Impairment: Methods: Explain/Verbal Responses: Reinforcements needed, State content correctly Electronic Signature(s) Signed: 11/30/2022 10:40:58 AM By: Rhae Hammock RN Entered By: Rhae Hammock on 10/22/2022 09:48:08 -------------------------------------------------------------------------------- Wound Assessment Details Patient Name: Date of Service: Joe Herald, [name] 10/22/2022 9:15 A M Medical Record Number: [id number] Patient Account Number: [id number] Date of Birth/Sex: Treating RN: 09/08/1956 (67 y.o. M) Primary Care Sesilia Poucher: Salena Saner Other Clinician: Referring Brycin Kille: Treating  Shellhammer/Extender: Christian Mate Weeks in Treatment: 8 Wound Status Wound Number: 1 Primary Etiology: Pressure Ulcer Wound Location: Sacrum Wound Status: Open Wounding Event: Pressure Injury Comorbid History: Coronary Artery Disease, Hypertension, Osteoarthritis Date Acquired: 08/07/2022 Weeks Of Treatment: 8 Clustered Wound: No Photos Wound Measurements Length: (cm) 0.8 Width: (cm) 0.4 Depth: (cm) 0.7 Area: (cm) 0.251 Volume: (cm) 0.176 % Reduction in Area: 98.5% % Reduction in Volume: 99.2% Epithelialization: Small (1-33%) Tunneling: Yes Position (o'clock): 12 Maximum Distance: (cm) 5.2 Undermining: No Wound Description Classification: Category/Stage IV Wound Margin: Distinct, outline attached Exudate Amount: Medium Exudate Type: Serosanguineous Exudate Color: red, brown Foul Odor After Cleansing: No Slough/Fibrino No Wound Bed Granulation Amount: Large (67-100%) Exposed Structure Ellerby, Trelyn A ([id number]) [id number] Page 8 of 8 Granulation Quality: Red, Pink Fascia Exposed: No Necrotic Amount: None Present (0%) Fat Layer (Subcutaneous Tissue) Exposed: Yes Tendon Exposed: No Muscle Exposed: Yes Necrosis of Muscle: Yes Joint Exposed: No Bone Exposed: No Periwound Skin Texture Texture Color No  Abnormalities Noted: No No Abnormalities Noted: No Callus: No Atrophie Blanche: No Crepitus: No Cyanosis: No Excoriation: No Ecchymosis: No Induration: No Erythema: No Rash: No Hemosiderin Staining: No Scarring: No Mottled: No Pallor: No Moisture Rubor: No No Abnormalities Noted: No Dry / Scaly: No Temperature / Pain Maceration: No Tenderness on Palpation: Yes Electronic Signature(s) Signed: 10/23/2022 11:33:40 AM By: Erenest Blank Entered By: Erenest Blank on 10/22/2022 09:31:09 -------------------------------------------------------------------------------- Vitals Details Patient Name: Date of Service: Joe Herald, [name] 10/22/2022 9:15 A M Medical Record Number: [id number] Patient Account Number: [id number] Date of Birth/Sex: Treating RN: 1956/09/11 (67 y.o. M) Primary Care Westley Blass: Salena Saner Other Clinician: Referring Theseus Birnie: Treating Shannell Mikkelsen/Extender: Ether Griffins in Treatment: 8 Vital Signs Time Taken: 09:20 Temperature (F): 97.7 Height (in): 67 Pulse (bpm): 75 Weight (lbs): 160 Respiratory Rate (breaths/min): 18 Body Mass Index (BMI): 25.1 Blood Pressure (mmHg): 98/61 Reference Range: 80 - 120 mg / dl Electronic Signature(s) Signed: 10/23/2022 11:33:40 AM By: Erenest Blank Entered By: Erenest Blank on 10/22/2022 09:21:41

## 2022-12-01 ENCOUNTER — Ambulatory Visit: Payer: Medicare Other | Attending: Student | Admitting: Student

## 2022-12-01 ENCOUNTER — Encounter: Payer: Self-pay | Admitting: Student

## 2022-12-01 VITALS — BP 103/62 | HR 53 | Ht 67.0 in | Wt 159.0 lb

## 2022-12-01 DIAGNOSIS — I480 Paroxysmal atrial fibrillation: Secondary | ICD-10-CM

## 2022-12-01 DIAGNOSIS — I2510 Atherosclerotic heart disease of native coronary artery without angina pectoris: Secondary | ICD-10-CM

## 2022-12-01 DIAGNOSIS — Z95828 Presence of other vascular implants and grafts: Secondary | ICD-10-CM

## 2022-12-01 DIAGNOSIS — I340 Nonrheumatic mitral (valve) insufficiency: Secondary | ICD-10-CM

## 2022-12-01 DIAGNOSIS — I447 Left bundle-branch block, unspecified: Secondary | ICD-10-CM

## 2022-12-01 DIAGNOSIS — I5022 Chronic systolic (congestive) heart failure: Secondary | ICD-10-CM

## 2022-12-01 NOTE — Patient Instructions (Signed)
Medication Instructions:  Your physician recommends that you continue on your current medications as directed. Please refer to the Current Medication list given to you today.  *If you need a refill on your cardiac medications before your next appointment, please call your pharmacy*   Lab Work: None ordered  If you have labs (blood work) drawn today and your tests are completely normal, you will receive your results only by: [place] (if you have MyChart) OR A paper copy in the mail If you have any lab test that is abnormal or we need to change your treatment, we will call you to review the results.   Testing/Procedures: None ordered   Follow-Up: At [facility], you and your health needs are our priority.  As part of our continuing mission to provide you with exceptional heart care, we have created designated Provider Care Teams.  These Care Teams include your primary Cardiologist (physician) and Advanced Practice Providers (APPs -  Physician Assistants and Nurse Practitioners) who all work together to provide you with the care you need, when you need it.  We recommend signing up for the patient portal called "MyChart".  Sign up information is provided on this After Visit Summary.  MyChart is used to connect with patients for Virtual Visits (Telemedicine).  Patients are able to view lab/test results, encounter notes, upcoming appointments, etc.  Non-urgent messages can be sent to your provider as well.   To learn more about what you can do with MyChart, go to [email or web address].    Your next appointment:   2-3  month(s) You will get a call  Provider:   Lars Mage, MD    Other Instructions

## 2022-12-02 ENCOUNTER — Other Ambulatory Visit (HOSPITAL_BASED_OUTPATIENT_CLINIC_OR_DEPARTMENT_OTHER): Payer: Self-pay

## 2022-12-08 LAB — LAB REPORT - SCANNED: EGFR: 95

## 2022-12-09 ENCOUNTER — Other Ambulatory Visit (HOSPITAL_BASED_OUTPATIENT_CLINIC_OR_DEPARTMENT_OTHER): Payer: Self-pay | Admitting: Cardiovascular Disease

## 2022-12-09 ENCOUNTER — Other Ambulatory Visit (HOSPITAL_BASED_OUTPATIENT_CLINIC_OR_DEPARTMENT_OTHER): Payer: Self-pay

## 2022-12-09 LAB — LAB REPORT - SCANNED: EGFR: 82

## 2022-12-09 MED ORDER — ATORVASTATIN CALCIUM 80 MG PO TABS
80.0000 mg | ORAL_TABLET | Freq: Every day | ORAL | 2 refills | Status: DC
  Filled 2022-12-09: qty 90, 90d supply, fill #0
  Filled 2023-01-18 – 2023-03-09 (×2): qty 90, 90d supply, fill #1
  Filled 2023-06-07: qty 90, 90d supply, fill #2

## 2022-12-09 MED ORDER — AMIODARONE HCL 200 MG PO TABS
200.0000 mg | ORAL_TABLET | Freq: Every day | ORAL | 2 refills | Status: DC
  Filled 2022-12-09: qty 90, 90d supply, fill #0

## 2022-12-09 NOTE — Telephone Encounter (Signed)
Rx(s) sent to pharmacy electronically.  

## 2022-12-11 ENCOUNTER — Other Ambulatory Visit (HOSPITAL_BASED_OUTPATIENT_CLINIC_OR_DEPARTMENT_OTHER): Payer: Self-pay

## 2022-12-15 LAB — LAB REPORT - SCANNED: EGFR: 98

## 2022-12-17 ENCOUNTER — Encounter (HOSPITAL_BASED_OUTPATIENT_CLINIC_OR_DEPARTMENT_OTHER): Payer: Medicare Other | Attending: Internal Medicine | Admitting: Internal Medicine

## 2022-12-17 DIAGNOSIS — G8929 Other chronic pain: Secondary | ICD-10-CM | POA: Insufficient documentation

## 2022-12-17 DIAGNOSIS — I447 Left bundle-branch block, unspecified: Secondary | ICD-10-CM | POA: Diagnosis not present

## 2022-12-17 DIAGNOSIS — L89154 Pressure ulcer of sacral region, stage 4: Secondary | ICD-10-CM | POA: Insufficient documentation

## 2022-12-17 DIAGNOSIS — M1990 Unspecified osteoarthritis, unspecified site: Secondary | ICD-10-CM | POA: Diagnosis not present

## 2022-12-17 DIAGNOSIS — M4628 Osteomyelitis of vertebra, sacral and sacrococcygeal region: Secondary | ICD-10-CM

## 2022-12-17 DIAGNOSIS — I5042 Chronic combined systolic (congestive) and diastolic (congestive) heart failure: Secondary | ICD-10-CM | POA: Insufficient documentation

## 2022-12-17 DIAGNOSIS — Z953 Presence of xenogenic heart valve: Secondary | ICD-10-CM | POA: Diagnosis not present

## 2022-12-17 DIAGNOSIS — I110 Hypertensive heart disease with heart failure: Secondary | ICD-10-CM | POA: Diagnosis not present

## 2022-12-17 DIAGNOSIS — I2510 Atherosclerotic heart disease of native coronary artery without angina pectoris: Secondary | ICD-10-CM | POA: Diagnosis not present

## 2022-12-17 DIAGNOSIS — Z951 Presence of aortocoronary bypass graft: Secondary | ICD-10-CM | POA: Insufficient documentation

## 2022-12-18 NOTE — Progress Notes (Addendum)
[name], [name] [phone]) [phone].pdf Page 1 of 8 Visit Report for 12/17/2022 Chief Complaint Document Details Patient Name: Date of Service: Joe [name], [place] RK A. 12/17/2022 8:00 A M Medical Record Number: [id number] Patient Account Number: [id number] Date of Birth/Sex: Treating RN: Jan 20, 1956 (67 y.o. M) Primary Care Provider: Salena [name] Other Clinician: Referring Provider: Treating Provider/Extender: Ether Griffins in Treatment: 16 Information Obtained from: Patient Chief Complaint 08/27/2022; patient presents for sacral wound Electronic Signature(s) Signed: 12/17/2022 12:51:47 PM By: Kalman Shan DO Entered By: Kalman Shan on 12/17/2022 10:00:05 -------------------------------------------------------------------------------- HPI Details Patient Name: Date of Service: Joe Herald, [name] 12/17/2022 8:00 A M Medical Record Number: [id number] Patient Account Number: [id number] Date of Birth/Sex: Treating RN: [birth year] (67 y.o. M) Primary Care Provider: Salena [name] Other Clinician: Referring Provider: Treating Provider/Extender: Ether Griffins in Treatment: 55 History of Present Illness HPI Description: 08/27/2022 [name] is a 67 year old male with a past medical history of chronic combined systolic diastolic heart failure, coronary artery disease status post CABG x1, left bundle branch block That presents to the clinic for a sacral ulcer. Patient was admitted to the hospital on 07/08/2022 For 6 weeks and discharged on 08/13/2022 For placement of a bioprosthetic aortic valve due to an ascending aortic aneurysm. During this admission patient states that he developed the sacral ulcer. He is not bedbound and ambulates with a walker. He is currently living with his mother and home health comes out for dressing changes. Currently he is using Medihoney every 3 days. There is  mild odor. Currently denies systemic signs of infection. 11/30; patient presents for follow-up. He has been using Dakin's wet-to-dry dressings mother is present during the encounter. He has no issues or complaints today. He denies signs of infection. 12/14; patient presents for follow-up. He has been using Dakin's wet-to-dry dressings. He has no issues or complaints today. He denies signs of infection. 12/28; patient presents for follow-up. He has been using Dakin's wet-to-dry dressings. He has no issues or complaints today. 1/11; patient presents for follow-up. He had a sacral x-ray done at last clinic visit that showed findings consistent with osteomyelitis to the mid to inferior sacrum. He is scheduled to see infectious disease on 1/15. He has been using Dakin's wet-to-dry dressings. He has chronic pain to the site. He denies systemic signs of infection. 1/25; patient presents for follow-up. He saw Dr. Juleen China with infectious disease on 1/15. Patient was started on a PICC line and plan is for 6 weeks of IV antibiotics. Patient has been using Dakin's wet-to-dry packing strips. 2/15; patient presents for follow-up. He is on week 3 of IV antibiotics. He has been using Dakin's wet-to-dry packing strips. He has no issues or complaints today. 3/7; patient presents for follow-up. He has completed his course of IV antibiotics. He has been using Dakin's wet-to-dry packing strip. He denies any pain to the wound site as he previously had chronic pain. He wants to hold off on doing HBO therapy. Electronic Signature(s) Signed: 12/17/2022 12:51:47 PM By: Kalman Shan DO Entered By: Kalman Shan on 12/17/2022 10:03:32 -------------------------------------------------------------------------------- Physical Exam Details Patient Name: Date of Service: Joe [name], [place] A. 12/17/2022 8:00 A M Medical Record Number: [id number] Patient Account Number: [id number] Joe, [name] A ([id number])  [phone].pdf Page 2 of 8 Date of Birth/Sex: Treating RN: November 29, 1955 (67 y.o. M) Primary Care Provider: Other Clinician: Salena [name] Referring Provider: Treating Provider/Extender: Christian Mate  Weeks in Treatment: 16 Constitutional respirations regular, non-labored and within target range for patient.[name] Kitchen Psychiatric pleasant and cooperative. Notes Sacral ulcer: Small open wound with granulation tissue at the opening of that tunnels for about 3 cm. Electronic Signature(s) Signed: 12/17/2022 12:51:47 PM By: Kalman Shan DO Entered By: Kalman Shan on 12/17/2022 10:07:28 -------------------------------------------------------------------------------- Physician Orders Details Patient Name: Date of Service: Joe Herald, [name] 12/17/2022 8:00 A M Medical Record Number: [id number] Patient Account Number: [id number] Date of Birth/Sex: Treating RN: 14-Sep-1956 (67 y.o. [name] Primary Care Provider: Salena [name] Other Clinician: Referring Provider: Treating Provider/Extender: Ether Griffins in Treatment: [phone] Verbal / Phone Orders: No Diagnosis Coding ICD-10 Coding Code Description L89.154 Pressure ulcer of sacral region, stage 4 I50.42 Chronic combined systolic (congestive) and diastolic (congestive) heart failure Z95.1 Presence of aortocoronary bypass graft M46.28 Osteomyelitis of vertebra, sacral and sacrococcygeal region Follow-up Appointments ppointment in 1 week. - Dr. Heber [place] Return A Other: - culture taken- if positive will order topical compounding antibiotics from [facility]. Will call you. consider hyperbarics. Anesthetic (In clinic) Topical Lidocaine 5% applied to wound bed Off-Loading Turn and reposition every 2 hours [place] wound care orders this week; continue Home Health for wound care. May utilize formulary equivalent dressing for wound  treatment orders unless otherwise specified. - Advance to change 3x a week. Pt.'s mother will change all other times. primary dressing endoform packing into wound bed. culture taken if positive will add topical compounding antibiotics to the primary dressing. Hyperbaric Oxygen Therapy Evaluate for HBO Therapy Indication: - Osteomyelitis of sacrum If appropriate for treatment, begin HBOT per protocol: 2.0 ATA for 90 Minutes with 2 Five (5) Minute A Breaks ir Total Number of Treatments: - 40 One treatments per day (delivered Monday through Friday unless otherwise specified in Special Instructions below): Finger stick Blood Glucose Pre- and Post- HBOT Treatment. Follow Hyperbaric Oxygen Glycemia Protocol Give two 4oz orange juices in addition to Glucerna when the glycemic protocol is used. A frin (Oxymetazoline HCL) 0.05% nasal spray - 1 spray in both nostrils daily as needed prior to HBO treatment for difficulty clearing ears Wound Treatment Wound #1 - Sacrum Cleanser: Soap and Water ([place]) 1 x Per Day/30 Days Discharge Instructions: May shower and wash wound with dial antibacterial soap and water prior to dressing change. Cleanser: Wound Cleanser (Home Health) (Generic) 1 x Per Day/30 Days Discharge Instructions: Cleanse the wound with wound cleanser prior to applying a clean dressing using gauze sponges, not tissue or cotton balls. Lennox, [name] [phone]) [phone].pdf Page 3 of 8 Peri-Wound Care: Skin Prep (Home Health) (Generic) 1 x Per Day/30 Days Discharge Instructions: Use skin prep as directed Prim Dressing: Endoform 2x2 in ([place]) 1 x Per Day/30 Days ary Discharge Instructions: Moisten with saline- pack into wound bed and tunnel. Secondary Dressing: Woven Gauze Sponge, Non-Sterile 4x4 in (Generic) 1 x Per Day/30 Days Discharge Instructions: Apply over primary dressing as directed. Secondary Dressing: Zetuvit Plus Silicone Border Dressing  4x4 (in/in) (Generic) 1 x Per Day/30 Days Discharge Instructions: Apply silicone border over primary dressing as directed. Laboratory erobe culture (MICRO) - PCR culture taken of sacral wound. - (ICD10 L89.154 - Pressure ulcer Bacteria identified in Unspecified specimen by A of sacral region, stage 4) LOINC Code: [id number] Convenience Name: Aerobic culture-specimen not specified GLYCEMIA INTERVENTIONS PROTOCOL PRE-HBO GLYCEMIA INTERVENTIONS ACTION INTERVENTION Obtain pre-HBO capillary blood glucose (ensure 1 physician order is in chart). A. Notify HBO physician and  await physician orders. 2 If result is 70 mg/dl or below: B. If the result meets the hospital definition of a critical result, follow hospital policy. A. Give patient an 8 ounce Glucerna Shake, an 8 ounce Ensure, or 8 ounces of a Glucerna/Ensure equivalent dietary supplement*. B. Wait 30 minutes. If result is 71 mg/dl to 130 mg/dl: C. Retest patients capillary blood glucose (CBG). D. If result greater than or equal to 110 mg/dl, proceed with HBO. If result less than 110 mg/dl, notify HBO physician and consider holding HBO. If result is 131 mg/dl to 249 mg/dl: A. Proceed with HBO. A. Notify HBO physician and await physician orders. B. It is recommended to hold HBO and do If result is 250 mg/dl or greater: blood/urine ketone testing. C. If the result meets the hospital definition of a critical result, follow hospital policy. POST-HBO GLYCEMIA INTERVENTIONS ACTION INTERVENTION Obtain post HBO capillary blood glucose (ensure 1 physician order is in chart). A. Notify HBO physician and await physician orders. 2 If result is 70 mg/dl or below: B. If the result meets the hospital definition of a critical result, follow hospital policy. A. Give patient an 8 ounce Glucerna Shake, an 8 ounce Ensure, or 8 ounces of a Glucerna/Ensure equivalent dietary supplement*. B. Wait 15 minutes for symptoms of If result is 71  mg/dl to 100 mg/dl: hypoglycemia (i.e. nervousness, anxiety, sweating, chills, clamminess, irritability, confusion, tachycardia or dizziness). C. If patient asymptomatic, discharge patient. If patient symptomatic, repeat capillary blood glucose (CBG) and notify HBO physician. If result is 101 mg/dl to 249 mg/dl: A. Discharge patient. A. Notify HBO physician and await physician orders. B. It is recommended to do blood/urine ketone If result is 250 mg/dl or greater: testing. C. If the result meets the hospital definition of a critical result, follow hospital policy. *Juice or candies are NOT equivalent products. If patient refuses the Glucerna or Ensure, please consult the hospital dietitian for an appropriate substitute. Electronic Signature(s) Signed: 01/05/2023 10:35:32 AM By: Kalman Shan DO Signed: 01/13/2023 4:26:45 PM By: Rhae Hammock RN Previous Signature: 12/17/2022 12:51:47 PM Version By: Kalman Shan DO Entered By: Rhae Hammock on 12/30/2022 15:16:34 [place], [place] A ([id number].pdf Page 4 of 8 -------------------------------------------------------------------------------- Problem List Details Patient Name: Date of Service: Joe [name], [place] RK A. 12/17/2022 8:00 A M Medical Record Number: [id number] Patient Account Number: [id number] Date of Birth/Sex: Treating RN: 1956/01/16 (67 y.o. [name] Primary Care Provider: Salena [name] Other Clinician: Referring Provider: Treating Provider/Extender: Ether Griffins in Treatment: 16 Active Problems ICD-10 Encounter Code Description Active Date MDM Diagnosis L89.154 Pressure ulcer of sacral region, stage 4 08/27/2022 No Yes I50.42 Chronic combined systolic (congestive) and diastolic (congestive) heart failure 08/27/2022 No Yes Z95.1 Presence of aortocoronary bypass graft 08/27/2022 No Yes M46.28 Osteomyelitis of vertebra, sacral and  sacrococcygeal region 10/22/2022 No Yes Inactive Problems Resolved Problems Electronic Signature(s) Signed: 12/17/2022 12:51:47 PM By: Kalman Shan DO Entered By: Kalman Shan on 12/17/2022 09:59:52 -------------------------------------------------------------------------------- Progress Note Details Patient Name: Date of Service: Joe Herald, [name] 12/17/2022 8:00 A M Medical Record Number: [id number] Patient Account Number: [id number] Date of Birth/Sex: Treating RN: [birth year] (67 y.o. M) Primary Care Provider: Salena [name] Other Clinician: Referring Provider: Treating Provider/Extender: Ether Griffins in Treatment: 16 Subjective Chief Complaint Information obtained from Patient 08/27/2022; patient presents for sacral wound History of Present Illness (HPI) 08/27/2022 [name] is a 67 year old male with a past medical  history of chronic combined systolic diastolic heart failure, coronary artery disease status post CABG x1, left bundle branch block That presents to the clinic for a sacral ulcer. Patient was admitted to the hospital on 07/08/2022 For 6 weeks and discharged on 08/13/2022 For placement of a bioprosthetic aortic valve due to an ascending aortic aneurysm. During this admission patient states that he developed the sacral ulcer. He is not bedbound and ambulates with a walker. He is currently living with his mother and home health comes out for dressing changes. Currently he is using Medihoney every 3 days. There is mild odor. Currently denies systemic signs of infection. 11/30; patient presents for follow-up. He has been using Dakin's wet-to-dry dressings mother is present during the encounter. He has no issues or complaints today. He denies signs of infection. 12/14; patient presents for follow-up. He has been using Dakin's wet-to-dry dressings. He has no issues or complaints today. He denies signs of infection. 12/28; patient  presents for follow-up. He has been using Dakin's wet-to-dry dressings. He has no issues or complaints today. [name], Aarit A [id number]) [phone].pdf Page 5 of 8 1/11; patient presents for follow-up. He had a sacral x-ray done at last clinic visit that showed findings consistent with osteomyelitis to the mid to inferior sacrum. He is scheduled to see infectious disease on 1/15. He has been using Dakin's wet-to-dry dressings. He has chronic pain to the site. He denies systemic signs of infection. 1/25; patient presents for follow-up. He saw Dr. Juleen China with infectious disease on 1/15. Patient was started on a PICC line and plan is for 6 weeks of IV antibiotics. Patient has been using Dakin's wet-to-dry packing strips. 2/15; patient presents for follow-up. He is on week 3 of IV antibiotics. He has been using Dakin's wet-to-dry packing strips. He has no issues or complaints today. 3/7; patient presents for follow-up. He has completed his course of IV antibiotics. He has been using Dakin's wet-to-dry packing strip. He denies any pain to the wound site as he previously had chronic pain. He wants to hold off on doing HBO therapy. Patient History Family History Heart Disease - Mother,Maternal Grandparents. Social History Never smoker, Alcohol Use - Never, Drug Use - No History, Caffeine Use - Never. Medical History Cardiovascular Patient has history of Coronary Artery Disease, Hypertension Musculoskeletal Patient has history of Osteoarthritis - bilateral hips; left shoulder Hospitalization/Surgery History - cardioversions 07/2022. - CABG 07/08/2022. - hip replacements 2015. - 2018 left shoulder surgery. - epicardial pacing lead placement 07/08/2022. Medical A Surgical History Notes nd Constitutional Symptoms (General Health) cognitive deficits congenital brain damage expressive speech delay Cardiovascular PVC LBBB thoracic ascending aortic aneurysm epicardial pacing  lead placement Gastrointestinal umbilical hernia Genitourinary kidney stones Objective Constitutional respirations regular, non-labored and within target range for patient.. Vitals Time Taken: 8:13 AM, Height: 67 in, Weight: 160 lbs, BMI: 25.1, Temperature: 98.4 F, Pulse: 52 bpm, Respiratory Rate: 20 breaths/min, Blood Pressure: 103/64 mmHg. Psychiatric pleasant and cooperative. General Notes: Sacral ulcer: Small open wound with granulation tissue at the opening of that tunnels for about 3 cm. Integumentary (Hair, Skin) Wound #1 status is Open. Original cause of wound was Pressure Injury. The date acquired was: 08/07/2022. The wound has been in treatment 16 weeks. The wound is located on the Sacrum. The wound measures 0.2cm length x 0.2cm width x 0.7cm depth; 0.031cm^2 area and 0.022cm^3 volume. There is Fat Layer (Subcutaneous Tissue) exposed. There is no undermining noted, however, there is tunneling at 12:00 with a maximum distance  of 3.1cm. There is a medium amount of serosanguineous drainage noted. The wound margin is distinct with the outline attached to the wound base. There is large (67-100%) red, pink granulation within the wound bed. There is no necrotic tissue within the wound bed. The periwound skin appearance did not exhibit: Callus, Crepitus, Excoriation, Induration, Rash, Scarring, Dry/Scaly, Maceration, Atrophie Blanche, Cyanosis, Ecchymosis, Hemosiderin Staining, Mottled, Pallor, Rubor, Erythema. The periwound has tenderness on palpation. Assessment Active Problems ICD-10 Pressure ulcer of sacral region, stage 4 Chronic combined systolic (congestive) and diastolic (congestive) heart failure Presence of aortocoronary bypass graft Osteomyelitis of vertebra, sacral and sacrococcygeal region [name], [name] ([id number]) [phone].pdf Page 6 of 8 Patient's wound has shown significant improvement in size since last clinic visit. He is completed 6  weeks of IV antibiotics. He no longer has pain to the wound site. I recommended at this time HBO therapy since the wound is still open. He declined doing this. I recommended using endoform and getting a PCR culture as he would benefit from [facility] antibiotic ointment in the tunnel. He is agreeable to this. Follow-up in 1 week. Continue aggressive offloading. Plan Follow-up Appointments: Return Appointment in 1 week. - Dr. Heber [place] Other: - culture taken- if positive will order topical compounding antibiotics from [facility]. Will call you. consider hyperbarics. Anesthetic: (In clinic) Topical Lidocaine 5% applied to wound bed Off-Loading: Turn and reposition every 2 hours Home Health: New wound care orders this week; continue Home Health for wound care. May utilize formulary equivalent dressing for wound treatment orders unless otherwise specified. - Advance to change 3x a week. Pt.'s mother will change all other times. primary dressing endoform packing into wound bed. culture taken if positive will add topical compounding antibiotics to the primary dressing. Hyperbaric Oxygen Therapy: Evaluate for HBO Therapy Indication: - Osteomyelitis of sacrum If appropriate for treatment, begin HBOT per protocol: 2.0 ATA for 90 Minutes with 2 Five (5) Minute Air Breaks T Number of Treatments: - 40 otal One treatments per day (delivered Monday through Friday unless otherwise specified in Special Instructions below): Finger stick Blood Glucose Pre- and Post- HBOT Treatment. Follow Hyperbaric Oxygen Glycemia Protocol Give two 4oz orange juices in addition to Glucerna when the glycemic protocol is used. Afrin (Oxymetazoline HCL) 0.05% nasal spray - 1 spray in both nostrils daily as needed prior to HBO treatment for difficulty clearing ears Laboratory ordered were: Aerobic culture-specimen not specified - PCR culture taken of sacral wound. WOUND #1: - Sacrum Wound Laterality: Cleanser: Soap and  Water (Home Health) 1 x Per Day/30 Days Discharge Instructions: May shower and wash wound with dial antibacterial soap and water prior to dressing change. Cleanser: Wound Cleanser (Home Health) (Generic) 1 x Per Day/30 Days Discharge Instructions: Cleanse the wound with wound cleanser prior to applying a clean dressing using gauze sponges, not tissue or cotton balls. Peri-Wound Care: Skin Prep (Home Health) (Generic) 1 x Per Day/30 Days Discharge Instructions: Use skin prep as directed Prim Dressing: Endoform 2x2 in ([place]) 1 x Per Day/30 Days ary Discharge Instructions: Moisten with saline- pack into wound bed and tunnel. Secondary Dressing: Woven Gauze Sponge, Non-Sterile 4x4 in (Generic) 1 x Per Day/30 Days Discharge Instructions: Apply over primary dressing as directed. Secondary Dressing: Zetuvit Plus Silicone Border Dressing 4x4 (in/in) (Generic) 1 x Per Day/30 Days Discharge Instructions: Apply silicone border over primary dressing as directed. 1. Endoform 2. PCR culture 3. Aggressive offloading 4. Follow-up in 1 week Electronic Signature(s) Signed: 01/04/2023 4:40:03 PM By:  Deon Pilling RN, BSN Signed: 01/05/2023 10:35:32 AM By: Kalman Shan DO Previous Signature: 12/17/2022 12:51:47 PM Version By: Kalman Shan DO Entered By: Deon Pilling on 01/04/2023 14:44:45 -------------------------------------------------------------------------------- HxROS Details Patient Name: Date of Service: Joe Herald, [name] 12/17/2022 8:00 A M Medical Record Number: [id number] Patient Account Number: [id number] Date of Birth/Sex: Treating RN: 1956/08/16 (67 y.o. M) Primary Care Provider: Salena [name] Other Clinician: Referring Provider: Treating Provider/Extender: Ether Griffins in Treatment: 16 Constitutional Symptoms (General Health) Medical History: Past Medical History Notes: cognitive deficits congenital brain damage expressive speech  delay Cardiovascular Medical History: [name], [name] ([id number]) [id number] Page 7 of 8 Positive for: Coronary Artery Disease; Hypertension Past Medical History Notes: PVC LBBB thoracic ascending aortic aneurysm epicardial pacing lead placement Gastrointestinal Medical History: Past Medical History Notes: umbilical hernia Genitourinary Medical History: Past Medical History Notes: kidney stones Musculoskeletal Medical History: Positive for: Osteoarthritis - bilateral hips; left shoulder Immunizations Pneumococcal Vaccine: Received Pneumococcal Vaccination: No Implantable Devices No devices added Hospitalization / Surgery History Type of Hospitalization/Surgery cardioversions 07/2022 CABG 07/08/2022 hip replacements 2015 2018 left shoulder surgery epicardial pacing lead placement 07/08/2022 Family and Social History Heart Disease: Yes - Mother,Maternal Grandparents; Never smoker; Alcohol Use: Never; Drug Use: No History; Caffeine Use: Never; Financial Concerns: No; Food, Clothing or Shelter Needs: No; Support System Lacking: No; Transportation Concerns: No Electronic Signature(s) Signed: 12/17/2022 12:51:47 PM By: Kalman Shan DO Entered By: Kalman Shan on 12/17/2022 10:03:37 -------------------------------------------------------------------------------- SuperBill Details Patient Name: Date of Service: Joe Herald, MA [name]. 12/17/2022 Medical Record Number: [id number] Patient Account Number: [id number] Date of Birth/Sex: Treating RN: [birth year] (67 y.o. M) Primary Care Provider: Salena [name] Other Clinician: Referring Provider: Treating Provider/Extender: Ether Griffins in Treatment: 16 Diagnosis Coding ICD-10 Codes Code Description L89.154 Pressure ulcer of sacral region, stage 4 I50.42 Chronic combined systolic (congestive) and diastolic (congestive) heart failure Z95.1 Presence of aortocoronary  bypass graft M46.28 Osteomyelitis of vertebra, sacral and sacrococcygeal region Facility Procedures Physician Procedures : CPT4 Code Description Modifier [id number] - WC PHYS LEVEL 3 - EST PT ICD-10 Diagnosis Description L89.154 Pressure ulcer of sacral region, stage 4 I50.42 Chronic combined systolic (congestive) and diastolic (congestive) heart failure Z95.1 Presence  of aortocoronary bypass graft M46.28 Osteomyelitis of vertebra, sacral and sacrococcygeal region Quantity: 1 Electronic Signature(s) Signed: 01/04/2023 2:41:34 PM By: Deon Pilling RN, BSN Signed: 01/05/2023 10:35:32 AM By: Kalman Shan DO Previous Signature: 12/17/2022 12:51:47 PM Version By: Kalman Shan DO Entered By: Deon Pilling on 01/04/2023 14:38:46

## 2022-12-19 NOTE — Progress Notes (Addendum)
Joe [name] [phone] Page 1 of 7 Visit Report for 12/17/2022 Arrival Information Details Patient Name: Date of Service: Joe [name], [place] [name]. 12/17/2022 8:00 [name] Medical Record Number: [id number] Patient Account Number: [id number] Date of Birth/Sex: Treating RN: Dec 20, 1955 (67 y.o. Joe [name] Primary Care Joe [name]: Joe [name] Other Clinician: Referring Joe [name]: Treating Joe [name]/Extender: Joe [name] in Treatment: 16 Visit Information History Since Last Visit Added or deleted any medications: No Patient Arrived: Ambulatory Any new allergies or adverse reactions: No Arrival Time: 08:10 Had [name] fall or experienced change in No Accompanied By: self activities of daily living that may affect Transfer Assistance: None risk of falls: Patient Identification Verified: Yes Signs or symptoms of abuse/neglect since last visito No Secondary Verification Process Completed: Yes Hospitalized since last visit: No Patient Requires Transmission-Based Precautions: No Implantable device outside of the clinic excluding No Patient Has Alerts: Yes cellular tissue based products placed in the center Patient Alerts: Patient on Blood Thinner since last visit: PICC LINE RIGHT ARM Has Dressing in Place as Prescribed: Yes NO BP'S RIGHT ARM Pain Present Now: No Notes per mother completed antibiotics. Patient smiling, walking, and sitting with no pain per patient. Electronic Signature(s) Signed: 12/17/2022 4:49:59 PM By: Joe Pilling RN, BSN Entered By: Joe [name] on 12/17/2022 08:13:30 -------------------------------------------------------------------------------- Clinic Level of Care Assessment Details Patient Name: Date of Service: Joe [name], [place] [name]. 12/17/2022 8:00 [name] Medical Record Number: [id number] Patient Account Number: [id number] Date of Birth/Sex: Treating RN: 1956-09-19 (67 y.o. Joe [name], Joe [name] Primary  Care [place]: Joe [name] Other Clinician: Referring Joe [name]: Treating Joe [name]/Extender: Joe [name] in Treatment: 16 Clinic Level of Care Assessment Items TOOL 4 Quantity Score X- 1 0 Use when only an EandM is performed on FOLLOW-UP visit ASSESSMENTS - Nursing Assessment / Reassessment X- 1 10 Reassessment of Co-morbidities (includes updates in patient status) X- 1 5 Reassessment of Adherence to Treatment Plan ASSESSMENTS - Wound and Skin [name] ssessment / Reassessment X - Simple Wound Assessment / Reassessment - one wound 1 5 []  - 0 Complex Wound Assessment / Reassessment - multiple wounds []  - 0 Dermatologic / Skin Assessment (not related to wound area) ASSESSMENTS - Focused Assessment []  - 0 Circumferential Edema Measurements - multi extremities []  - 0 Nutritional Assessment / Counseling / Intervention []  - 0 Lower Extremity Assessment (monofilament, tuning fork, pulses) []  - 0 Peripheral Arterial Disease Assessment (using hand held doppler) ASSESSMENTS - Ostomy and/or Continence Assessment and Care []  - 0 Incontinence Assessment and Management Bickert, Joe [name] ([id number].pdf Page 2 of 7 []  - 0 Ostomy Care Assessment and Management (repouching, etc.) PROCESS - Coordination of Care X - Simple Patient / Family Education for ongoing care 1 15 []  - 0 Complex (extensive) Patient / Family Education for ongoing care X- 1 10 Staff obtains Programmer, systems, Records, T Results / Process Orders est []  - 0 Staff telephones HHA, Nursing Homes / Clarify orders / etc []  - 0 Routine Transfer to another Facility (non-emergent condition) []  - 0 Routine Hospital Admission (non-emergent condition) []  - 0 New Admissions / Biomedical engineer / Ordering NPWT Apligraf, etc. , []  - 0 Emergency Hospital Admission (emergent condition) X- 1 10 Simple Discharge Coordination []  - 0 Complex (extensive) Discharge  Coordination PROCESS - Special Needs []  - 0 Pediatric / Minor Patient Management []  - 0 Isolation Patient Management []  - 0 Hearing / Language / Visual special needs []  - 0  Assessment of Community assistance (transportation, D/C planning, etc.) []  - 0 Additional assistance / Altered mentation []  - 0 Support Surface(s) Assessment (bed, cushion, seat, etc.) INTERVENTIONS - Wound Cleansing / Measurement X - Simple Wound Cleansing - one wound 1 5 []  - 0 Complex Wound Cleansing - multiple wounds X- 1 5 Wound Imaging (photographs - any number of wounds) []  - 0 Wound Tracing (instead of photographs) X- 1 5 Simple Wound Measurement - one wound []  - 0 Complex Wound Measurement - multiple wounds INTERVENTIONS - Wound Dressings X - Small Wound Dressing one or multiple wounds 1 10 []  - 0 Medium Wound Dressing one or multiple wounds []  - 0 Large Wound Dressing one or multiple wounds []  - 0 Application of Medications - topical []  - 0 Application of Medications - injection INTERVENTIONS - Miscellaneous []  - 0 External ear exam []  - 0 Specimen Collection (cultures, biopsies, blood, body fluids, etc.) []  - 0 Specimen(s) / Culture(s) sent or taken to Lab for analysis []  - 0 Patient Transfer (multiple staff / Civil Service fast streamer / Similar devices) []  - 0 Simple Staple / Suture removal (25 or less) []  - 0 Complex Staple / Suture removal (26 or more) []  - 0 Hypo / Hyperglycemic Management (close monitor of Blood Glucose) []  - 0 Ankle / Brachial Index (ABI) - do not check if billed separately X- 1 5 Vital Signs Has the patient been seen at the hospital within the last three years: Yes Total Score: 85 Level Of Care: New/Established - Level 3 Blea, Joe [name] ([id number].pdf Page 3 of 7 Electronic Signature(s) Signed: 01/04/2023 2:41:34 PM By: Joe Pilling RN, BSN Entered By: Joe [name] on 01/04/2023  14:38:40 -------------------------------------------------------------------------------- Complex / Palliative Patient Assessment Details Patient Name: Date of Service: Joe TI, [place] [name]. 12/17/2022 8:00 [name] Medical Record Number: [id number] Patient Account Number: [id number] Date of Birth/Sex: Treating RN: 1956/08/15 (67 y.o. Joe [name] Primary Care Joe [name]: Joe [name] Other Clinician: Referring Jalen Oberry: Treating Joe [name]/Extender: Joe [name] in Treatment: 16 Complex Wound Management Criteria Patient has remarkable or complex co-morbidities requiring medications or treatments that extend wound healing times. Examples: Diabetes mellitus with chronic renal failure or end stage renal disease requiring dialysis Advanced or poorly controlled rheumatoid arthritis Diabetes mellitus and end stage chronic obstructive pulmonary disease Active cancer with current chemo- or radiation therapy osteomyelitis sacrum, congential brain damage, HTN, CAD, PVD, LBBB, CABG, OA Palliative Wound Management Criteria Care Approach Wound Care Plan: Complex Wound Management Electronic Signature(s) Signed: 12/21/2022 10:26:56 AM By: Joe Pilling RN, BSN Signed: 12/22/2022 3:47:22 PM By: Kalman Shan DO Entered By: Joe [name] on 12/21/2022 10:26:56 -------------------------------------------------------------------------------- Lower Extremity Assessment Details Patient Name: Date of Service: Joe Herald, MA [name]. 12/17/2022 8:00 [name] Medical Record Number: [id number] Patient Account Number: [id number] Date of Birth/Sex: Treating RN: July 27, 1956 (67 y.o. Joe [name] Primary Care Danh Bayus: Joe [name] Other Clinician: Referring Donye Dauenhauer: Treating Joe [name]/Extender: Joe [name] in Treatment: 16 Electronic Signature(s) Signed: 12/17/2022 4:49:59 PM By: Joe Pilling RN, BSN Entered By: Joe [name] on 12/17/2022  08:13:57 -------------------------------------------------------------------------------- Multi Wound Chart Details Patient Name: Date of Service: Joe Herald, MA [name]. 12/17/2022 8:00 [name] Medical Record Number: [id number] Patient Account Number: [id number] Date of Birth/Sex: Treating RN: Feb 16, 1956 (67 y.o. M) Primary Care Narayan Scull: Joe [name] Other Clinician: Referring Kiwan Gadsden: Treating Joe [name]/Extender: Joe [name] in Treatment: 16 Vital Signs Height(in): 67 Pulse(bpm): 52 Weight(lbs): 160  Blood Pressure(mmHg): 103/64 Body Mass Index(BMI): 25.1 Temperature(F): 98.4 Respiratory Rate(breaths/min): 20 [1:Photos:] [N/[name]:N/[name]] Sacrum N/[name] N/[name] Wound Location: Pressure Injury N/[name] N/[name] Wounding Event: Pressure Ulcer N/[name] N/[name] Primary Etiology: Coronary Artery Disease, N/[name] N/[name] Comorbid History: Hypertension, Osteoarthritis 08/07/2022 N/[name] N/[name] Date Acquired: 16 N/[name] N/[name] Weeks of Treatment: Open N/[name] N/[name] Wound Status: No N/[name] N/[name] Wound Recurrence: 0.2x0.2x0.7 N/[name] N/[name] Measurements L x W x D (cm) 0.031 N/[name] N/[name] [name] (cm) : rea 0.022 N/[name] N/[name] Volume (cm) : 99.80% N/[name] N/[name] % Reduction in [name] rea: 99.90% N/[name] N/[name] % Reduction in Volume: 12 Position 1 (o'clock): 3.1 Maximum Distance 1 (cm): Yes N/[name] N/[name] Tunneling: Category/Stage IV N/[name] N/[name] Classification: Medium N/[name] N/[name] Exudate [name] mount: Serosanguineous N/[name] N/[name] Exudate Type: red, brown N/[name] N/[name] Exudate Color: Distinct, outline attached N/[name] N/[name] Wound Margin: Large (67-100%) N/[name] N/[name] Granulation [name] mount: Red, Pink N/[name] N/[name] Granulation Quality: None Present (0%) N/[name] N/[name] Necrotic [name] mount: Fat Layer (Subcutaneous Tissue): Yes N/[name] N/[name] Exposed Structures: Fascia: No Tendon: No Muscle: No Joint: No Bone: No Medium (34-66%) N/[name] N/[name] Epithelialization: Excoriation: No N/[name] N/[name] Periwound Skin Texture: Induration: No Callus: No Crepitus: No Rash: No Scarring: No Maceration: No N/[name]  N/[name] Periwound Skin Moisture: Dry/Scaly: No Atrophie Blanche: No N/[name] N/[name] Periwound Skin Color: Cyanosis: No Ecchymosis: No Erythema: No Hemosiderin Staining: No Mottled: No Pallor: No Rubor: No Yes N/[name] N/[name] Tenderness on Palpation: Treatment Notes Electronic Signature(s) Signed: 12/17/2022 12:51:47 PM By: Kalman Shan DO Entered By: Kalman Shan on 12/17/2022 09:59:57 -------------------------------------------------------------------------------- Multi-Disciplinary Care Plan Details Patient Name: Date of Service: Joe Herald, MA [name]. 12/17/2022 8:00 [name] Medical Record Number: [id number] Patient Account Number: [id number] Date of Birth/Sex: Treating RN: 14-Dec-1955 (67 y.o. Joe [name] Primary Care Cherylann Hobday: Joe [name] Other Clinician: Referring Quintez Maselli: Treating Joe [name]/Extender: Joe [name] in Treatment: [address] Inactive [place], [name] ([id number]) [id number] Page 5 of 7 Wound/Skin Impairment Nursing Diagnoses: Impaired tissue integrity Knowledge deficit related to ulceration/compromised skin integrity Goals: Patient will have [name] decrease in wound volume by X% from date: (specify in notes) Date Initiated: 08/27/2022 Target Resolution Date: 02/12/2023 Goal Status: Active Patient/caregiver will verbalize understanding of skin care regimen Date Initiated: 08/27/2022 Target Resolution Date: 12/12/2022 Goal Status: Active Ulcer/skin breakdown will have [name] volume reduction of 30% by week 4 Date Initiated: 08/27/2022 Date Inactivated: 12/17/2022 Target Resolution Date: 11/14/2022 Goal Status: Met Interventions: Assess patient/caregiver ability to obtain necessary supplies Assess patient/caregiver ability to perform ulcer/skin care regimen upon admission and as needed Assess ulceration(s) every visit Notes: Electronic Signature(s) Signed: 12/17/2022 4:49:59 PM By: Joe Pilling RN, BSN Entered By:  Joe [name] on 12/17/2022 08:20:43 -------------------------------------------------------------------------------- Pain Assessment Details Patient Name: Date of Service: Joe Herald, MA [name]. 12/17/2022 8:00 [name] Medical Record Number: [id number] Patient Account Number: [id number] Date of Birth/Sex: Treating RN: 11-11-1955 (67 y.o. Joe [name] Primary Care Yolette Hastings: Joe [name] Other Clinician: Referring Laird Runnion: Treating Muaaz Brau/Extender: Joe [name] in Treatment: 16 Active Problems Location of Pain Severity and Description of Pain Patient Has Paino No Site Locations Pain Management and Medication Current Pain Management: Electronic Signature(s) Signed: 12/17/2022 4:49:59 PM By: Joe Pilling RN, BSN Entered By: Joe [name] on 12/17/2022 08:13:53 Lamore, Joe [name] ([id number].pdf Page 6 of 7 -------------------------------------------------------------------------------- Patient/Caregiver Education Details Patient Name: Date of Service: Joe [name], [place] [name]. 3/7/2024andnbsp8:00 [place] Record Number: [id number] Patient Account Number: [id number] Date of Birth/Gender: Treating RN: 06/03/1956 (67 y.o.  Joe [name] Primary Care Physician: Joe [name] Other Clinician: Referring Physician: Treating Physician/Extender: Joe [name] in Treatment: 16 Education Assessment Education Provided To: Patient and Caregiver Education Topics Provided Pressure: Handouts: Pressure Injury: Prevention and Offloading Methods: Explain/Verbal Responses: Reinforcements needed Electronic Signature(s) Signed: 12/17/2022 4:49:59 PM By: Joe Pilling RN, BSN Entered By: Joe [name] on 12/17/2022 08:20:57 -------------------------------------------------------------------------------- Wound Assessment Details Patient Name: Date of Service: Joe Herald, MA [name]. 12/17/2022 8:00 [name]  M Medical Record Number: [id number] Patient Account Number: [id number] Date of Birth/Sex: Treating RN: 09-04-1956 (67 y.o. Joe [name], Joe [name] Primary Care Renato Spellman: Joe [name] Other Clinician: Referring Kyandra Mcclaine: Treating Catheleen Langhorne/Extender: Joe [name] in Treatment: 16 Wound Status Wound Number: 1 Primary Etiology: Pressure Ulcer Wound Location: Sacrum Wound Status: Open Wounding Event: Pressure Injury Comorbid History: Coronary Artery Disease, Hypertension, Osteoarthritis Date Acquired: 08/07/2022 Weeks Of Treatment: 16 Clustered Wound: No Photos Wound Measurements Length: (cm) 0.2 Width: (cm) 0.2 Depth: (cm) 0.7 Area: (cm) 0.031 Volume: (cm) 0.022 % Reduction in Area: 99.8% % Reduction in Volume: 99.9% Epithelialization: Medium (34-66%) Tunneling: Yes Position (o'clock): 12 Maximum Distance: (cm) 3.1 Undermining: No Wound Description Classification: Category/Stage IV Joe [name], Joe [name] ([id number]) Wound Margin: Distinct, outline attached Exudate Amount: Medium Exudate Type: Serosanguineous Exudate Color: red, brown Foul Odor After Cleansing: No [id number] Page 7 of 7 Slough/Fibrino No Wound Bed Granulation Amount: Large (67-100%) Exposed Structure Granulation Quality: Red, Pink Fascia Exposed: No Necrotic Amount: None Present (0%) Fat Layer (Subcutaneous Tissue) Exposed: Yes Tendon Exposed: No Muscle Exposed: No Joint Exposed: No Bone Exposed: No Periwound Skin Texture Texture Color No Abnormalities Noted: No No Abnormalities Noted: No Callus: No Atrophie Blanche: No Crepitus: No Cyanosis: No Excoriation: No Ecchymosis: No Induration: No Erythema: No Rash: No Hemosiderin Staining: No Scarring: No Mottled: No Pallor: No Moisture Rubor: No No Abnormalities Noted: No Dry / Scaly: No Temperature / Pain Maceration: No Tenderness on Palpation: Yes Electronic Signature(s) Signed:  12/17/2022 4:49:59 PM By: Joe Pilling RN, BSN Entered By: Joe [name] on 12/17/2022 08:19:49 -------------------------------------------------------------------------------- Vitals Details Patient Name: Date of Service: Joe Herald, MA [name]. 12/17/2022 8:00 [name] Medical Record Number: [id number] Patient Account Number: [id number] Date of Birth/Sex: Treating RN: [birth year] (67 y.o. Joe [name], Joe [name] Primary Care Ziah Turvey: Joe [name] Other Clinician: Referring Jerica Creegan: Treating Rogelio Waynick/Extender: Joe [name] in Treatment: 16 Vital Signs Time Taken: 08:13 Temperature (F): 98.4 Height (in): 67 Pulse (bpm): 52 Weight (lbs): 160 Respiratory Rate (breaths/min): 20 Body Mass Index (BMI): 25.1 Blood Pressure (mmHg): 103/64 Reference Range: 80 - 120 mg / dl Electronic Signature(s) Signed: 12/17/2022 4:49:59 PM By: Joe Pilling RN, BSN Entered By: Joe [name] on 12/17/2022 08:13:48

## 2022-12-21 ENCOUNTER — Ambulatory Visit: Payer: Medicare Other | Admitting: Internal Medicine

## 2022-12-21 ENCOUNTER — Other Ambulatory Visit: Payer: Self-pay

## 2022-12-21 ENCOUNTER — Encounter: Payer: Self-pay | Admitting: Internal Medicine

## 2022-12-21 VITALS — BP 105/63 | HR 55 | Temp 97.6°F | Wt 166.0 lb

## 2022-12-21 DIAGNOSIS — R7989 Other specified abnormal findings of blood chemistry: Secondary | ICD-10-CM | POA: Diagnosis not present

## 2022-12-21 DIAGNOSIS — S31000D Unspecified open wound of lower back and pelvis without penetration into retroperitoneum, subsequent encounter: Secondary | ICD-10-CM

## 2022-12-21 NOTE — Assessment & Plan Note (Addendum)
Patient doing well and has completed his empiric 6-week course of antibiotics with Daptomycin and Ceftriaxone last week.  PICC line has been removed.  Recommend continued wound care, offloading, nutrition.  They report a wound swab was done at last wound care visit and, per the notes, this was done to possibly consider antibiotic ointment in the tunnel.  Appears that a PCR culture was sent off from this swab. Follow-up as needed with [place].

## 2022-12-21 NOTE — Assessment & Plan Note (Signed)
OPAT labs noted abnormal LFTs last week.  Will repeat today.

## 2022-12-21 NOTE — Progress Notes (Signed)
[facility]  CHIEF COMPLAINT:    Follow up for sacral wound and osteomyelitis  SUBJECTIVE:    Joe [name] is a 67 y.o. male with PMHx as below who presents to the clinic for sacral wound and osteomyelitis.  Patient presents for routine follow-up.  He had a PICC line placed on 11/04/2022 and completed a empiric daptomycin and ceftriaxone course x 6 weeks on 12/16/2022 and PICC line was subsequently removed.  He continues to follow-up with wound care.  His last set of OPAT labs from 3/4 showed a normal ESR and normal CRP.  His LFTs were elevated with AST 54 ALT 106 which was new.  He reports no new complaints today.  His wound is progressing along.  There is an area of tunneling that sounds like the wound center is monitoring.  They report a superficial culture was done last time and they are considering hyperbaric therapy.    Please see A&P for the details of today's visit and status of the patient's medical problems.   Patient's Medications  New Prescriptions   No medications on file  Previous Medications   ACETAMINOPHEN (TYLENOL) 325 MG TABLET    Take 650 mg by mouth every 6 (six) hours as needed for mild pain.   AMIODARONE (PACERONE) 200 MG TABLET    Take 1 tablet (200 mg total) by mouth daily.   APIXABAN (ELIQUIS) 5 MG TABS TABLET    Take 1 tablet (5 mg total) by mouth 2 (two) times daily.   ASPIRIN EC 81 MG TABLET    Take 1 tablet (81 mg total) by mouth daily. Swallow whole.   ATORVASTATIN (LIPITOR) 80 MG TABLET    Take 1 tablet (80 mg total) by mouth daily.   CHOLECALCIFEROL (VITAMIN D3) 250 MCG (10000 UT) CAPSULE    Take 10,000 Units by mouth daily.   COENZYME Q10 (CO Q-10) 100 MG CAPS    Take 100 mg by mouth daily.   FLUTICASONE (FLONASE) 50 MCG/ACT NASAL SPRAY    Place 2 sprays into both nostrils daily.   FUROSEMIDE (LASIX) 20 MG TABLET    Take 2 tablets (40 mg total) by mouth daily as needed for fluid.   OVER THE COUNTER MEDICATION     PRUNE JUICE/FLAXSEED   POTASSIUM CHLORIDE SA (KLOR-CON M) 20 MEQ TABLET    Take 1 tablet (20 mEq total) by mouth as needed (Take one tab on the days you require lasix).   SACUBITRIL-VALSARTAN (ENTRESTO) 24-26 MG    Take 1 tablet by mouth 2 (two) times daily.   SPIRONOLACTONE (ALDACTONE) 25 MG TABLET    Take 0.5 tablets (12.5 mg total) by mouth daily.   TAMSULOSIN (FLOMAX) 0.4 MG CAPS CAPSULE    Take 1 capsule (0.4 mg total) by mouth 2 (two) times daily.  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   Murmur 05/26/2022   On mechanically assisted ventilation (HCC)    Osteoarthritis of left hip 01/16/2014   Osteoarthritis of right hip 05/15/2014   Primary localized osteoarthrosis of left shoulder 09/21/2017   PVC (premature ventricular contraction) 05/26/2022   Right ureteral stone    Speech impediment    Thoracic ascending aortic aneurysm (HCC)    Umbilical hernia     Social History   Tobacco Use   Smoking status: Never   Smokeless tobacco: Never  Vaping Use   Vaping Use: Never used  Substance Use Topics   Alcohol use: No   Drug use: No    Family History  Problem Relation Age of Onset   Heart failure Mother    Atrial fibrillation Mother    Heart failure Maternal Grandmother    Atrial fibrillation Maternal Grandmother     Allergies  Allergen Reactions   Codeine Nausea Only   Levaquin [Levofloxacin In D5w] Diarrhea    Review of Systems  All other systems reviewed and are  negative.   OBJECTIVE:    Vitals:   12/21/22 1547  BP: 105/63  Pulse: (!) 55  Temp: 97.6 F (36.4 C)  TempSrc: Oral  SpO2: 93%  Weight: 166 lb (75.3 kg)   Body mass index is 26 kg/m.  Physical Exam Constitutional:      Appearance: Normal appearance.  Eyes:     Extraocular Movements: Extraocular movements intact.     Conjunctiva/sclera: Conjunctivae normal.  Pulmonary:     Effort: Pulmonary effort is normal. No respiratory distress.  Musculoskeletal:        General: Normal range of motion.     Cervical back: Normal range of motion and neck supple.  Skin:    General: Skin is warm and dry.  Neurological:     General: No focal deficit present.     Mental Status: He is alert and oriented to person, place, and time.  Psychiatric:        Mood and Affect: Mood normal.        Behavior: Behavior normal.      Labs and Microbiology:    Latest Ref Rng & Units 10/26/2022    3:47 PM 09/25/2022    2:20 PM 09/02/2022   10:20 AM  CBC  WBC 3.8 - 10.8 Thousand/uL 6.0  7.3  7.5   Hemoglobin 13.2 - 17.1 g/dL 11.5  11.7  10.9   Hematocrit 38.5 - 50.0 % 33.9  36.4  32.8   Platelets 140 - 400 Thousand/uL 276  318  323       Latest Ref Rng & Units 10/26/2022    3:47 PM 09/28/2022   11:05 AM 09/25/2022    2:20 PM  CMP  Glucose 65 - 99 mg/dL 95  101  109   BUN 7 - 25 mg/dL '31  25  27   '$ Creatinine 0.70 - 1.35 mg/dL 0.85  0.75  0.90   Sodium 135 - 146 mmol/L 135  137  136   Potassium 3.5 - 5.3 mmol/L 5.2  4.1  4.7   Chloride 98 - 110 mmol/L 102  104  103   CO2 20 - 32 mmol/L '28  26  23   '$ Calcium 8.6 - 10.3 mg/dL 9.3  9.2  9.3   Total Protein 6.1 - 8.1 g/dL 6.4   6.7   Total Bilirubin 0.2 - 1.2 mg/dL 0.6   0.4   Alkaline Phos 38 - 126 U/L  71   AST 10 - 35 U/L 16   15   ALT 9 - 46 U/L 18   16      No results found for this or any previous visit (from the past 240 hour(s)).    ASSESSMENT & PLAN:    Sacral wound Patient doing well and has completed his empiric 6-week  course of antibiotics with Daptomycin and Ceftriaxone last week.  PICC line has been removed.  Recommend continued wound care, offloading, nutrition.  They report a wound swab was done at last wound care visit and, per the notes, this was done to possibly consider antibiotic ointment in the tunnel.  Appears that a PCR culture was sent off from this swab. Follow-up as needed with [place].  Abnormal LFTs OPAT labs noted abnormal LFTs last week.  Will repeat today.     [facility] 12/21/2022, 4:10 PM

## 2022-12-22 LAB — HEPATIC FUNCTION PANEL
AG Ratio: 1.5 (calc) (ref 1.0–2.5)
ALT: 66 U/L — ABNORMAL HIGH (ref 9–46)
AST: 33 U/L (ref 10–35)
Albumin: 4.1 g/dL (ref 3.6–5.1)
Alkaline phosphatase (APISO): 83 U/L (ref 35–144)
Bilirubin, Direct: 0.2 mg/dL (ref 0.0–0.2)
Globulin: 2.7 g/dL (calc) (ref 1.9–3.7)
Indirect Bilirubin: 0.4 mg/dL (calc) (ref 0.2–1.2)
Total Bilirubin: 0.6 mg/dL (ref 0.2–1.2)
Total Protein: 6.8 g/dL (ref 6.1–8.1)

## 2022-12-23 ENCOUNTER — Other Ambulatory Visit: Payer: Self-pay

## 2022-12-23 ENCOUNTER — Other Ambulatory Visit (HOSPITAL_BASED_OUTPATIENT_CLINIC_OR_DEPARTMENT_OTHER): Payer: Self-pay

## 2022-12-24 ENCOUNTER — Encounter (HOSPITAL_BASED_OUTPATIENT_CLINIC_OR_DEPARTMENT_OTHER): Payer: Medicare Other | Admitting: Internal Medicine

## 2022-12-24 DIAGNOSIS — M4628 Osteomyelitis of vertebra, sacral and sacrococcygeal region: Secondary | ICD-10-CM | POA: Diagnosis not present

## 2022-12-24 DIAGNOSIS — Z951 Presence of aortocoronary bypass graft: Secondary | ICD-10-CM

## 2022-12-24 DIAGNOSIS — L89154 Pressure ulcer of sacral region, stage 4: Secondary | ICD-10-CM | POA: Diagnosis not present

## 2022-12-24 DIAGNOSIS — I5042 Chronic combined systolic (congestive) and diastolic (congestive) heart failure: Secondary | ICD-10-CM

## 2022-12-25 ENCOUNTER — Telehealth: Payer: Self-pay

## 2022-12-25 NOTE — Telephone Encounter (Signed)
Called patient to relay the following results, no answer.    "Hi Joe [name],   Your liver enzymes that were elevated have improved when checked again earlier this week.  One of the enzymes (ALT) is still just above normal.  I would recommend we recheck this in about 6 weeks.  You can do this with your Primary Care or come to our office for a lab visit to have a Hepatic Function Panel done.  Let us know where you prefer to go.  If you come to our clinic, our staff will schedule you for a Lab visit.   Thanks, Ihor Dow"   Beryle Flock, RN

## 2022-12-25 NOTE — Progress Notes (Signed)
Prosise, Sigismund A ([id number]) [id number] Page 1 of 8 Visit Report for 12/24/2022 Arrival Information Details Patient Name: Date of Service: Joe [name], [place] RK A. 12/24/2022 8:15 A M Medical Record Number: [id number] Patient Account Number: [id number] Date of Birth/Sex: Treating RN: [birth year] (67 y.o. Janyth Contes Primary Care Tassie Pollett: Salena Saner Other Clinician: Referring Syncere Eble: Treating Gerrod Maule/Extender: Ether Griffins in Treatment: 55 Visit Information History Since Last Visit Added or deleted any medications: No Patient Arrived: Ambulatory Any new allergies or adverse reactions: No Arrival Time: 08:05 Had a fall or experienced change in No Accompanied By: mother activities of daily living that may affect Transfer Assistance: None risk of falls: Patient Identification Verified: Yes Signs or symptoms of abuse/neglect since last visito No Secondary Verification Process Completed: Yes Hospitalized since last visit: No Patient Requires Transmission-Based Precautions: No Implantable device outside of the clinic excluding No Patient Has Alerts: Yes cellular tissue based products placed in the center Patient Alerts: Patient on Blood Thinner since last visit: PICC LINE RIGHT ARM Has Dressing in Place as Prescribed: Yes NO BP'S RIGHT ARM Pain Present Now: No Electronic Signature(s) Signed: 12/24/2022 4:01:22 PM By: Adline Peals Entered By: Adline Peals on 12/24/2022 08:08:09 -------------------------------------------------------------------------------- Clinic Level of Care Assessment Details Patient Name: Date of Service: Joe [name], [place] RK A. 12/24/2022 8:15 A M Medical Record Number: [id number] Patient Account Number: [id number] Date of Birth/Sex: Treating RN: 06-Feb-1956 (67 y.o. Janyth Contes Primary Care Terrisa Curfman: Salena Saner Other Clinician: Referring Tiffany Calmes: Treating  Jasa Dundon/Extender: Ether Griffins in Treatment: 17 Clinic Level of Care Assessment Items TOOL 4 Quantity Score X- 1 0 Use when only an EandM is performed on FOLLOW-UP visit ASSESSMENTS - Nursing Assessment / Reassessment []  - 0 Reassessment of Co-morbidities (includes updates in patient status) []  - 0 Reassessment of Adherence to Treatment Plan ASSESSMENTS - Wound and Skin A ssessment / Reassessment X - Simple Wound Assessment / Reassessment - one wound 1 5 []  - 0 Complex Wound Assessment / Reassessment - multiple wounds []  - 0 Dermatologic / Skin Assessment (not related to wound area) ASSESSMENTS - Focused Assessment []  - 0 Circumferential Edema Measurements - multi extremities []  - 0 Nutritional Assessment / Counseling / Intervention []  - 0 Lower Extremity Assessment (monofilament, tuning fork, pulses) []  - 0 Peripheral Arterial Disease Assessment (using hand held doppler) ASSESSMENTS - Ostomy and/or Continence Assessment and Care []  - 0 Incontinence Assessment and Management []  - 0 Ostomy Care Assessment and Management (repouching, etc.) PROCESS - Coordination of Care X - Simple Patient / Family Education for ongoing care 1 15 [place], [place]o A ([id number]) [id number] Page 2 of 8 []  - 0 Complex (extensive) Patient / Family Education for ongoing care X- 1 10 Staff obtains Programmer, systems, Records, T Results / Process Orders est []  - 0 Staff telephones HHA, Nursing Homes / Clarify orders / etc []  - 0 Routine Transfer to another Facility (non-emergent condition) []  - 0 Routine Hospital Admission (non-emergent condition) []  - 0 New Admissions / Biomedical engineer / Ordering NPWT Apligraf, etc. , []  - 0 Emergency Hospital Admission (emergent condition) X- 1 10 Simple Discharge Coordination []  - 0 Complex (extensive) Discharge Coordination PROCESS - Special Needs []  - 0 Pediatric / Minor Patient Management []  -  0 Isolation Patient Management []  - 0 Hearing / Language / Visual special needs []  - 0 Assessment of Community assistance (transportation, D/C planning, etc.) []  - 0 Additional assistance / Altered mentation []  -  0 Support Surface(s) Assessment (bed, cushion, seat, etc.) INTERVENTIONS - Wound Cleansing / Measurement X - Simple Wound Cleansing - one wound 1 5 []  - 0 Complex Wound Cleansing - multiple wounds X- 1 5 Wound Imaging (photographs - any number of wounds) []  - 0 Wound Tracing (instead of photographs) X- 1 5 Simple Wound Measurement - one wound []  - 0 Complex Wound Measurement - multiple wounds INTERVENTIONS - Wound Dressings X - Small Wound Dressing one or multiple wounds 1 10 []  - 0 Medium Wound Dressing one or multiple wounds []  - 0 Large Wound Dressing one or multiple wounds []  - 0 Application of Medications - topical []  - 0 Application of Medications - injection INTERVENTIONS - Miscellaneous []  - 0 External ear exam []  - 0 Specimen Collection (cultures, biopsies, blood, body fluids, etc.) []  - 0 Specimen(s) / Culture(s) sent or taken to Lab for analysis []  - 0 Patient Transfer (multiple staff / Civil Service fast streamer / Similar devices) []  - 0 Simple Staple / Suture removal (25 or less) []  - 0 Complex Staple / Suture removal (26 or more) []  - 0 Hypo / Hyperglycemic Management (close monitor of Blood Glucose) []  - 0 Ankle / Brachial Index (ABI) - do not check if billed separately X- 1 5 Vital Signs Has the patient been seen at the hospital within the last three years: Yes Total Score: 70 Level Of Care: New/Established - Level 2 Electronic Signature(s) Signed: 12/24/2022 4:01:22 PM By: Adline Peals Entered By: Adline Peals on 12/24/2022 08:32:43 [place], [place] A ([id number]) [id number] Page 3 of 8 -------------------------------------------------------------------------------- Encounter Discharge Information Details Patient  Name: Date of Service: Joe [name], [place] RK A. 12/24/2022 8:15 A M Medical Record Number: [id number] Patient Account Number: [id number] Date of Birth/Sex: Treating RN: [birth year] (67 y.o. Janyth Contes Primary Care Azana Kiesler: Salena Saner Other Clinician: Referring Janelis Stelzer: Treating Kinslei Labine/Extender: Ether Griffins in Treatment: 17 Encounter Discharge Information Items Discharge Condition: Stable Ambulatory Status: Ambulatory Discharge Destination: Home Transportation: Private Auto Accompanied By: mother Schedule Follow-up Appointment: Yes Clinical Summary of Care: Patient Declined Electronic Signature(s) Signed: 12/24/2022 4:01:22 PM By: Adline Peals Entered By: Adline Peals on 12/24/2022 08:33:10 -------------------------------------------------------------------------------- Lower Extremity Assessment Details Patient Name: Date of Service: Joe Herald, [name] 12/24/2022 8:15 A M Medical Record Number: [id number] Patient Account Number: [id number] Date of Birth/Sex: Treating RN: 1956-05-05 (67 y.o. Janyth Contes Primary Care Damondre Pfeifle: Salena Saner Other Clinician: Referring Graeme Menees: Treating Shania Bjelland/Extender: Ether Griffins in Treatment: 17 Electronic Signature(s) Signed: 12/24/2022 4:01:22 PM By: Adline Peals Entered By: Adline Peals on 12/24/2022 [id number] -------------------------------------------------------------------------------- Multi Wound Chart Details Patient Name: Date of Service: Joe Herald, [name] 12/24/2022 8:15 A M Medical Record Number: [id number] Patient Account Number: [id number] Date of Birth/Sex: Treating RN: [birth year] (67 y.o. M) Primary Care Eladia Frame: Salena Saner Other Clinician: Referring Yasuko Lapage: Treating Emari Hreha/Extender: Ether Griffins in Treatment: 17 Vital Signs Height(in): 67 Pulse(bpm):  57 Weight(lbs): 160 Blood Pressure(mmHg): 107/63 Body Mass Index(BMI): 25.1 Temperature(F): 97.9 Respiratory Rate(breaths/min): 16 [1:Photos:] [N/A:N/A] B[place]e, Roen A ([id number]) [1:Sacrum Wound Location: Pressure Injury Wounding Event: Pressure Ulcer Primary Etiology: Coronary Artery Disease, Comorbid History: Hypertension, Osteoarthritis 08/07/2022 Date Acquired: 17 Weeks of Treatment: Open Wound Status: No Wound Recurrence:  0.2x0.2x0.7 Measurements L x W x D (cm) 0.031 A (cm) : rea 0.022 Volume (cm) : 99.80% % Reduction in A rea: 99.90% % Reduction in Volume: 12 Position 1 (o'clock): 3 Maximum  Distance 1 (cm): Yes Tunneling: Category/Stage IV Classification: Medium  Exudate A mount: Serous Exudate Type: amber Exudate Color: Distinct, outline attached Wound Margin: Large (67-100%) Granulation A mount: Red, Pink Granulation Quality: None Present (0%) Necrotic A mount: Fat Layer (Subcutaneous Tissue): Yes N/A Exposed  Structures: Fascia: No Tendon: No Muscle: No Joint: No Bone: No Medium (34-66%) Epithelialization: Excoriation: No Periwound Skin Texture: Induration: No Callus: No Crepitus: No Rash: No Scarring: No Maceration: No Periwound Skin Moisture: Dry/Scaly: No  Atrophie Blanche: No Periwound Skin Color: Cyanosis: No Ecchymosis: No Erythema: No Hemosiderin Staining: No Mottled: No Pallor: No Rubor: No] [N/A:N/A N/A N/A N/A N/A N/A N/A N/A N/A N/A N/A N/A N/A N/A N/A N/A N/A N/A N/A N/A N/A N/A N/A N/A N/A N/A] Treatment Notes Wound #1 (Sacrum) Cleanser Soap and Water Discharge Instruction: May shower and wash wound with dial antibacterial soap and water prior to dressing change. Wound Cleanser Discharge Instruction: Cleanse the wound with wound cleanser prior to applying a clean dressing using gauze sponges, not tissue or cotton balls. Peri-Wound Care Skin Prep Discharge Instruction: Use skin prep as directed Topical Primary Dressing Endoform 2x2 in Discharge Instruction:  Moisten with saline- pack into wound bed and tunnel. Secondary Dressing Woven Gauze Sponge, Non-Sterile 4x4 in Discharge Instruction: Apply over primary dressing as directed. Zetuvit Plus Silicone Border Dressing 4x4 (in/in) Discharge Instruction: Apply silicone border over primary dressing as directed. Secured With Compression Wrap Compression Stockings Add-Ons [place], [place] A ([id number]) [phone].pdf Page 5 of 8 Electronic Signature(s) Signed: 12/24/2022 3:34:03 PM By: Kalman Shan DO Entered By: Kalman Shan on 12/24/2022 08:48:34 -------------------------------------------------------------------------------- Multi-Disciplinary Care Plan Details Patient Name: Date of Service: Joe Herald, [name] 12/24/2022 8:15 A M Medical Record Number: [id number] Patient Account Number: [id number] Date of Birth/Sex: Treating RN: [birth year] (67 y.o. Janyth Contes Primary Care Doyne Ellinger: Salena Saner Other Clinician: Referring Hue Steveson: Treating Nedra Mcinnis/Extender: Ether Griffins in Treatment: 17 Active Inactive Wound/Skin Impairment Nursing Diagnoses: Impaired tissue integrity Knowledge deficit related to ulceration/compromised skin integrity Goals: Patient will have a decrease in wound volume by X% from date: (specify in notes) Date Initiated: 08/27/2022 Target Resolution Date: 02/12/2023 Goal Status: Active Patient/caregiver will verbalize understanding of skin care regimen Date Initiated: 08/27/2022 Target Resolution Date: 12/12/2022 Goal Status: Active Ulcer/skin breakdown will have a volume reduction of 30% by week 4 Date Initiated: 08/27/2022 Date Inactivated: 12/17/2022 Target Resolution Date: 11/14/2022 Goal Status: Met Interventions: Assess patient/caregiver ability to obtain necessary supplies Assess patient/caregiver ability to perform ulcer/skin care regimen upon admission and as needed Assess ulceration(s)  every visit Notes: Electronic Signature(s) Signed: 12/24/2022 4:01:22 PM By: Adline Peals Entered By: Adline Peals on 12/24/2022 08:21:28 -------------------------------------------------------------------------------- Pain Assessment Details Patient Name: Date of Service: Joe Herald, [name] 12/24/2022 8:15 A M Medical Record Number: [id number] Patient Account Number: [id number] Date of Birth/Sex: Treating RN: 1956-04-20 (67 y.o. Janyth Contes Primary Care Jerzee Jerome: Salena Saner Other Clinician: Referring Trejan Buda: Treating Micharl Helmes/Extender: Ether Griffins in Treatment: 17 Active Problems Location of Pain Severity and Description of Pain Patient Has Paino No Site Locations Rate the pain. Emanuelson, Arif A ([id number]) [id number] Page 6 of 8 Rate the pain. Current Pain Level: 0 Pain Management and Medication Current Pain Management: Electronic Signature(s) Signed: 12/24/2022 4:01:22 PM By: Adline Peals Entered By: Adline Peals on 12/24/2022 08:08:26 -------------------------------------------------------------------------------- Patient/Caregiver Education Details Patient Name: Date of Service: Joe Herald, [name] 3/14/2024andnbsp8:15 A M Medical Record Number: [id number]  Patient Account Number: [id number] Date of Birth/Gender: Treating RN: 07-Oct-1956 (67 y.o. Janyth Contes Primary Care Physician: Salena Saner Other Clinician: Referring Physician: Treating Physician/Extender: Ether Griffins in Treatment: 17 Education Assessment Education Provided To: Patient Education Topics Provided Wound/Skin Impairment: Methods: Explain/Verbal Responses: Reinforcements needed, State content correctly Electronic Signature(s) Signed: 12/24/2022 4:01:22 PM By: Adline Peals Entered By: Adline Peals on 12/24/2022  08:21:39 -------------------------------------------------------------------------------- Wound Assessment Details Patient Name: Date of Service: Joe Herald, [name] 12/24/2022 8:15 A M Medical Record Number: [id number] Patient Account Number: [id number] Date of Birth/Sex: Treating RN: 11-29-1955 (67 y.o. Janyth Contes Primary Care Marcus Groll: Salena Saner Other Clinician: Referring Gwynn Chalker: Treating Earl Losee/Extender: Christian Mate Weeks in Treatment: 17 Wound Status Wound Number: 1 Primary Etiology: Pressure Ulcer Wound Location: Sacrum Wound Status: Open Wounding Event: Pressure Injury Comorbid History: Coronary Artery Disease, Hypertension, Osteoarthritis Date Acquired: 08/07/2022 Weeks Of Treatment: 17 Clustered Wound: No Fauver, Destry A ([id number]) [id number] Page 7 of 8 Photos Wound Measurements Length: (cm) 0.2 Width: (cm) 0.2 Depth: (cm) 0.7 Area: (cm) 0.031 Volume: (cm) 0.022 % Reduction in Area: 99.8% % Reduction in Volume: 99.9% Epithelialization: Medium (34-66%) Tunneling: Yes Position (o'clock): 12 Maximum Distance: (cm) 3 Undermining: No Wound Description Classification: Category/Stage IV Wound Margin: Distinct, outline attached Exudate Amount: Medium Exudate Type: Serous Exudate Color: amber Foul Odor After Cleansing: No Slough/Fibrino No Wound Bed Granulation Amount: Large (67-100%) Exposed Structure Granulation Quality: Red, Pink Fascia Exposed: No Necrotic Amount: None Present (0%) Fat Layer (Subcutaneous Tissue) Exposed: Yes Tendon Exposed: No Muscle Exposed: No Joint Exposed: No Bone Exposed: No Periwound Skin Texture Texture Color No Abnormalities Noted: No No Abnormalities Noted: No Callus: No Atrophie Blanche: No Crepitus: No Cyanosis: No Excoriation: No Ecchymosis: No Induration: No Erythema: No Rash: No Hemosiderin Staining: No Scarring: No Mottled:  No Pallor: No Moisture Rubor: No No Abnormalities Noted: No Dry / Scaly: No Maceration: No Treatment Notes Wound #1 (Sacrum) Cleanser Soap and Water Discharge Instruction: May shower and wash wound with dial antibacterial soap and water prior to dressing change. Wound Cleanser Discharge Instruction: Cleanse the wound with wound cleanser prior to applying a clean dressing using gauze sponges, not tissue or cotton balls. Peri-Wound Care Skin Prep Discharge Instruction: Use skin prep as directed Topical Primary Dressing Endoform 2x2 in Discharge Instruction: Moisten with saline- pack into wound bed and tunnel. Przybylski, Zyire A ([id number]) [id number] Page 8 of 8 Secondary Dressing Woven Gauze Sponge, Non-Sterile 4x4 in Discharge Instruction: Apply over primary dressing as directed. Zetuvit Plus Silicone Border Dressing 4x4 (in/in) Discharge Instruction: Apply silicone border over primary dressing as directed. Secured With Compression Wrap Compression Stockings Environmental education officer) Signed: 12/24/2022 4:01:22 PM By: Adline Peals Entered By: Adline Peals on 12/24/2022 08:21:18 -------------------------------------------------------------------------------- Vitals Details Patient Name: Date of Service: Joe Herald, [name] 12/24/2022 8:15 A M Medical Record Number: [id number] Patient Account Number: [id number] Date of Birth/Sex: Treating RN: 21-Sep-1956 (67 y.o. Janyth Contes Primary Care Annise Boran: Salena Saner Other Clinician: Referring Boy Delamater: Treating Juan Kissoon/Extender: Ether Griffins in Treatment: 17 Vital Signs Time Taken: 08:08 Temperature (F): 97.9 Height (in): 67 Pulse (bpm): 57 Weight (lbs): 160 Respiratory Rate (breaths/min): 16 Body Mass Index (BMI): 25.1 Blood Pressure (mmHg): 107/63 Reference Range: 80 - 120 mg / dl Electronic Signature(s) Signed: 12/24/2022 4:01:22  PM By: Adline Peals Entered By: Adline Peals on 12/24/2022 08:08:20

## 2022-12-25 NOTE — Progress Notes (Signed)
Joe, Sekai [name] ([id number]) [id number] Page 1 of 6 Visit Report for 12/24/2022 Chief Complaint Document Details Patient Name: Date of Service: Joe [name], [place] [name]. 12/24/2022 8:15 [name] Medical Record Number: [id number] Patient Account Number: [id number] Date of Birth/Sex: Treating RN: 1955-11-19 (67 y.o. M) Primary Care Provider: Salena [name] Other Clinician: Referring Provider: Treating Provider/Extender: Joe [name] in Treatment: 17 Information Obtained from: Patient Chief Complaint 08/27/2022; patient presents for sacral wound Electronic Signature(s) Signed: 12/24/2022 3:34:03 PM By: Joe Shan DO Entered By: Joe [name] on 12/24/2022 08:48:42 -------------------------------------------------------------------------------- HPI Details Patient Name: Date of Service: Joe Herald, MA [name]. 12/24/2022 8:15 [name] Medical Record Number: [id number] Patient Account Number: [id number] Date of Birth/Sex: Treating RN: 08-08-1956 (67 y.o. M) Primary Care Provider: Salena [name] Other Clinician: Referring Provider: Treating Provider/Extender: Joe [name] in Treatment: 17 History of Present Illness HPI Description: 08/27/2022 Joe [name] is [name] 67 year old male with [name] past medical history of chronic combined systolic diastolic heart failure, coronary artery disease status post CABG x1, left bundle branch block That presents to the clinic for [name] sacral ulcer. Patient was admitted to the hospital on 07/08/2022 For 6 weeks and discharged on 08/13/2022 For placement of [name] bioprosthetic aortic valve due to an ascending aortic aneurysm. During this admission patient states that he developed the sacral ulcer. He is not bedbound and ambulates with [name] walker. He is currently living with his mother and home health comes out for dressing changes. Currently he is using Medihoney every 3 days. There  is mild odor. Currently denies systemic signs of infection. 11/30; patient presents for follow-up. He has been using Dakin's wet-to-dry dressings mother is present during the encounter. He has no issues or complaints today. He denies signs of infection. 12/14; patient presents for follow-up. He has been using Dakin's wet-to-dry dressings. He has no issues or complaints today. He denies signs of infection. 12/28; patient presents for follow-up. He has been using Dakin's wet-to-dry dressings. He has no issues or complaints today. 1/11; patient presents for follow-up. He had [name] sacral x-ray done at last clinic visit that showed findings consistent with osteomyelitis to the mid to inferior sacrum. He is scheduled to see infectious disease on 1/15. He has been using Dakin's wet-to-dry dressings. He has chronic pain to the site. He denies systemic signs of infection. 1/25; patient presents for follow-up. He saw Joe [name] with infectious disease on 1/15. Patient was started on [name] PICC line and plan is for 6 weeks of IV antibiotics. Patient has been using Dakin's wet-to-dry packing strips. 2/15; patient presents for follow-up. He is on week 3 of IV antibiotics. He has been using Dakin's wet-to-dry packing strips. He has no issues or complaints today. 3/7; patient presents for follow-up. He has completed his course of IV antibiotics. He has been using Dakin's wet-to-dry packing strip. He denies any pain to the wound site as he previously had chronic pain. He wants to hold off on doing HBO therapy. 3/14; patient presents for follow-up. We started endoform at last clinic visit. He has done well with this. We also obtained [name] PCR culture that did not grow bacteria. There was Candida present however this looks to be colonization. Patient has no issues or complaints today. Electronic Signature(s) Signed: 12/24/2022 3:34:03 PM By: Joe Shan DO Entered By: Joe [name] on 12/24/2022 08:59:53 Physical  Exam Details -------------------------------------------------------------------------------- Joe [name] ([id number]) [id number] Page 2 of 6 Patient Name:  Date of Service: Joe [name], [place] [name]. 12/24/2022 8:15 [name] Medical Record Number: [id number] Patient Account Number: [id number] Date of Birth/Sex: Treating RN: April 18, 1956 (67 y.o. M) Primary Care Provider: Salena [name] Other Clinician: Referring Provider: Treating Provider/Extender: Joe [name] in Treatment: 17 Constitutional respirations regular, non-labored and within target range for patient.[name] Kitchen Psychiatric pleasant and cooperative. Notes Sacral ulcer: Small open wound with granulation tissue at the opening of that tunnels for about 3 cm. Electronic Signature(s) Signed: 12/24/2022 3:34:03 PM By: Joe Shan DO Entered By: Joe [name] on 12/24/2022 09:00:15 -------------------------------------------------------------------------------- Physician Orders Details Patient Name: Date of Service: Joe Herald, MA [name]. 12/24/2022 8:15 [name] Medical Record Number: [id number] Patient Account Number: [id number] Date of Birth/Sex: Treating RN: [birth year] (67 y.o. Joe [name] Primary Care Provider: Salena [name] Other Clinician: Referring Provider: Treating Provider/Extender: Joe [name] in Treatment: [phone] Verbal / Phone Orders: No Diagnosis Coding Follow-up Appointments ppointment in 2 weeks. - Dr. Heber La [name] Return [name] Off-Loading Turn and reposition every 2 hours [place] wound care orders this week; continue Home Health for wound care. May utilize formulary equivalent dressing for wound treatment orders unless otherwise specified. - Advance to change 3x [name] week. Pt.'s mother will change all other times. primary dressing endoform packing into wound bed. Wound Treatment Wound #1 - Sacrum Cleanser: Soap and Water  (Liberal) 1 x Per Day/30 Days Discharge Instructions: May shower and wash wound with dial antibacterial soap and water prior to dressing change. Cleanser: Wound Cleanser (Home Health) (Generic) 1 x Per Day/30 Days Discharge Instructions: Cleanse the wound with wound cleanser prior to applying [name] clean dressing using gauze sponges, not tissue or cotton balls. Peri-Wound Care: Skin Prep (Home Health) (Generic) 1 x Per Day/30 Days Discharge Instructions: Use skin prep as directed Prim Dressing: Endoform 2x2 in ([place]) 1 x Per Day/30 Days ary Discharge Instructions: Moisten with saline- pack into wound bed and tunnel. Secondary Dressing: Woven Gauze Sponge, Non-Sterile 4x4 in (Generic) 1 x Per Day/30 Days Discharge Instructions: Apply over primary dressing as directed. Secondary Dressing: Zetuvit Plus Silicone Border Dressing 4x4 (in/in) (Generic) 1 x Per Day/30 Days Discharge Instructions: Apply silicone border over primary dressing as directed. Electronic Signature(s) Signed: 12/24/2022 3:34:03 PM By: Joe Shan DO Entered By: Joe [name] on 12/24/2022 09:00:22 [place], [name] ([id number] Page 3 of 6 -------------------------------------------------------------------------------- Problem List Details Patient Name: Date of Service: Joe [name], [place] [name]. 12/24/2022 8:15 [name] Medical Record Number: [id number] Patient Account Number: [id number] Date of Birth/Sex: Treating RN: 06-22-1956 (67 y.o. M) Primary Care Provider: Salena [name] Other Clinician: Referring Provider: Treating Provider/Extender: Joe [name] in Treatment: 17 Active Problems ICD-10 Encounter Code Description Active Date MDM Diagnosis L89.154 Pressure ulcer of sacral region, stage 4 08/27/2022 No Yes I50.42 Chronic combined systolic (congestive) and diastolic (congestive) heart failure 08/27/2022 No Yes Z95.1 Presence of  aortocoronary bypass graft 08/27/2022 No Yes M46.28 Osteomyelitis of vertebra, sacral and sacrococcygeal region 10/22/2022 No Yes Inactive Problems Resolved Problems Electronic Signature(s) Signed: 12/24/2022 3:34:03 PM By: Joe Shan DO Entered By: Joe [name] on 12/24/2022 08:48:29 -------------------------------------------------------------------------------- Progress Note Details Patient Name: Date of Service: Joe Herald, MA [name]. 12/24/2022 8:15 [name] Medical Record Number: [id number] Patient Account Number: [id number] Date of Birth/Sex: Treating RN: July 09, 1956 (67 y.o. M) Primary Care Provider: Salena [name] Other Clinician: Referring Provider: Treating Provider/Extender: Joe [name] in  Treatment: 17 Subjective Chief Complaint Information obtained from Patient 08/27/2022; patient presents for sacral wound History of Present Illness (HPI) 08/27/2022 Mr. Fahed Gaffke is [name] 67 year old male with [name] past medical history of chronic combined systolic diastolic heart failure, coronary artery disease status post CABG x1, left bundle branch block That presents to the clinic for [name] sacral ulcer. Patient was admitted to the hospital on 07/08/2022 For 6 weeks and discharged on 08/13/2022 For placement of [name] bioprosthetic aortic valve due to an ascending aortic aneurysm. During this admission patient states that he developed the sacral ulcer. He is not bedbound and ambulates with [name] walker. He is currently living with his mother and home health comes out for dressing changes. Currently he is using Medihoney every 3 days. There is mild odor. Currently denies systemic signs of infection. 11/30; patient presents for follow-up. He has been using Dakin's wet-to-dry dressings mother is present during the encounter. He has no issues or complaints today. He denies signs of infection. 12/14; patient presents for follow-up. He has been using Dakin's wet-to-dry  dressings. He has no issues or complaints today. He denies signs of infection. 12/28; patient presents for follow-up. He has been using Dakin's wet-to-dry dressings. He has no issues or complaints today. 1/11; patient presents for follow-up. He had [name] sacral x-ray done at last clinic visit that showed findings consistent with osteomyelitis to the mid to inferior sacrum. He is scheduled to see infectious disease on 1/15. He has been using Dakin's wet-to-dry dressings. He has chronic pain to the site. He denies systemic signs of infection. Joe [name], Joe [name] ([id number]) [id number] Page 4 of 6 1/25; patient presents for follow-up. He saw Joe [name] with infectious disease on 1/15. Patient was started on [name] PICC line and plan is for 6 weeks of IV antibiotics. Patient has been using Dakin's wet-to-dry packing strips. 2/15; patient presents for follow-up. He is on week 3 of IV antibiotics. He has been using Dakin's wet-to-dry packing strips. He has no issues or complaints today. 3/7; patient presents for follow-up. He has completed his course of IV antibiotics. He has been using Dakin's wet-to-dry packing strip. He denies any pain to the wound site as he previously had chronic pain. He wants to hold off on doing HBO therapy. 3/14; patient presents for follow-up. We started endoform at last clinic visit. He has done well with this. We also obtained [name] PCR culture that did not grow bacteria. There was Candida present however this looks to be colonization. Patient has no issues or complaints today. Patient History Family History Heart Disease - Mother,Maternal Grandparents. Social History Never smoker, Alcohol Use - Never, Drug Use - No History, Caffeine Use - Never. Medical History Cardiovascular Patient has history of Coronary Artery Disease, Hypertension Musculoskeletal Patient has history of Osteoarthritis - bilateral hips; left shoulder Hospitalization/Surgery History -  cardioversions 07/2022. - CABG 07/08/2022. - hip replacements 2015. - 2018 left shoulder surgery. - epicardial pacing lead placement 07/08/2022. Medical [name] Surgical History Notes nd Constitutional Symptoms (General Health) cognitive deficits congenital brain damage expressive speech delay Cardiovascular PVC LBBB thoracic ascending aortic aneurysm epicardial pacing lead placement Gastrointestinal umbilical hernia Genitourinary kidney stones Objective Constitutional respirations regular, non-labored and within target range for patient.. Vitals Time Taken: 8:08 AM, Height: 67 in, Weight: 160 lbs, BMI: 25.1, Temperature: 97.9 F, Pulse: 57 bpm, Respiratory Rate: 16 breaths/min, Blood Pressure: 107/63 mmHg. Psychiatric pleasant and cooperative. General Notes: Sacral ulcer: Small open wound with granulation tissue at the opening of  that tunnels for about 3 cm. Integumentary (Hair, Skin) Wound #1 status is Open. Original cause of wound was Pressure Injury. The date acquired was: 08/07/2022. The wound has been in treatment 17 weeks. The wound is located on the Sacrum. The wound measures 0.2cm length x 0.2cm width x 0.7cm depth; 0.031cm^2 area and 0.022cm^3 volume. There is Fat Layer (Subcutaneous Tissue) exposed. There is no undermining noted, however, there is tunneling at 12:00 with [name] maximum distance of 3cm. There is [name] medium amount of serous drainage noted. The wound margin is distinct with the outline attached to the wound base. There is large (67-100%) red, pink granulation within the wound bed. There is no necrotic tissue within the wound bed. The periwound skin appearance did not exhibit: Callus, Crepitus, Excoriation, Induration, Rash, Scarring, Dry/Scaly, Maceration, Atrophie Blanche, Cyanosis, Ecchymosis, Hemosiderin Staining, Mottled, Pallor, Rubor, Erythema. Assessment Active Problems ICD-10 Pressure ulcer of sacral region, stage 4 Chronic combined systolic (congestive) and  diastolic (congestive) heart failure Presence of aortocoronary bypass graft Osteomyelitis of vertebra, sacral and sacrococcygeal region Joe [name], Joe [name] ([id number]) [id number] Page 5 of 6 Patient's wound is stable. He did well with endoform. I recommended continuing this. Continue aggressive offloading. Follow-up in 2 weeks. Plan Follow-up Appointments: Return Appointment in 2 weeks. - Dr. Heber Crandall Off-Loading: Turn and reposition every 2 hours Home Health: New wound care orders this week; continue Home Health for wound care. May utilize formulary equivalent dressing for wound treatment orders unless otherwise specified. - Advance to change 3x [name] week. Pt.'s mother will change all other times. primary dressing endoform packing into wound bed. WOUND #1: - Sacrum Wound Laterality: Cleanser: Soap and Water (Home Health) 1 x Per Day/30 Days Discharge Instructions: May shower and wash wound with dial antibacterial soap and water prior to dressing change. Cleanser: Wound Cleanser (Home Health) (Generic) 1 x Per Day/30 Days Discharge Instructions: Cleanse the wound with wound cleanser prior to applying [name] clean dressing using gauze sponges, not tissue or cotton balls. Peri-Wound Care: Skin Prep (Home Health) (Generic) 1 x Per Day/30 Days Discharge Instructions: Use skin prep as directed Prim Dressing: Endoform 2x2 in ([place]) 1 x Per Day/30 Days ary Discharge Instructions: Moisten with saline- pack into wound bed and tunnel. Secondary Dressing: Woven Gauze Sponge, Non-Sterile 4x4 in (Generic) 1 x Per Day/30 Days Discharge Instructions: Apply over primary dressing as directed. Secondary Dressing: Zetuvit Plus Silicone Border Dressing 4x4 (in/in) (Generic) 1 x Per Day/30 Days Discharge Instructions: Apply silicone border over primary dressing as directed. 1. Aggressive offloading 2. endoform 3. follow up in 2 weeks Electronic Signature(s) Signed: 12/24/2022 3:34:03  PM By: Joe Shan DO Entered By: Joe [name] on 12/24/2022 09:11:48 -------------------------------------------------------------------------------- HxROS Details Patient Name: Date of Service: Joe Herald, MA [name]. 12/24/2022 8:15 [name] Medical Record Number: [id number] Patient Account Number: [id number] Date of Birth/Sex: Treating RN: 1956/04/30 (67 y.o. M) Primary Care Provider: Salena [name] Other Clinician: Referring Provider: Treating Provider/Extender: Joe [name] in Treatment: 17 Constitutional Symptoms (General Health) Medical History: Past Medical History Notes: cognitive deficits congenital brain damage expressive speech delay Cardiovascular Medical History: Positive for: Coronary Artery Disease; Hypertension Past Medical History Notes: PVC LBBB thoracic ascending aortic aneurysm epicardial pacing lead placement Gastrointestinal Medical History: Past Medical History Notes: umbilical hernia Genitourinary Medical History: Past Medical History Notes: kidney stones Joe [name], Joe [name] ([id number]) [id number] Page 6 of 6 Musculoskeletal Medical History: Positive for: Osteoarthritis - bilateral hips; left shoulder Immunizations Pneumococcal Vaccine: Received Pneumococcal  Vaccination: No Implantable Devices No devices added Hospitalization / Surgery History Type of Hospitalization/Surgery cardioversions 07/2022 CABG 07/08/2022 hip replacements 2015 2018 left shoulder surgery epicardial pacing lead placement 07/08/2022 Family and Social History Heart Disease: Yes - Mother,Maternal Grandparents; Never smoker; Alcohol Use: Never; Drug Use: No History; Caffeine Use: Never; Financial Concerns: No; Food, Clothing or Shelter Needs: No; Support System Lacking: No; Transportation Concerns: No Electronic Signature(s) Signed: 12/24/2022 3:34:03 PM By: Joe Shan DO Entered By: Joe [name] on  12/24/2022 08:59:58 -------------------------------------------------------------------------------- SuperBill Details Patient Name: Date of Service: Joe Herald, MA [place] [name]. 12/24/2022 Medical Record Number: [id number] Patient Account Number: [id number] Date of Birth/Sex: Treating RN: May 06, 1956 (67 y.o. Joe [name] Primary Care Provider: Salena [name] Other Clinician: Referring Provider: Treating Provider/Extender: Joe [name] in Treatment: 17 Diagnosis Coding ICD-10 Codes Code Description L89.154 Pressure ulcer of sacral region, stage 4 I50.42 Chronic combined systolic (congestive) and diastolic (congestive) heart failure Z95.1 Presence of aortocoronary bypass graft M46.28 Osteomyelitis of vertebra, sacral and sacrococcygeal region Facility Procedures : CPT4 Code: [id number] Description: [id number] - WOUND CARE VISIT-LEV 2 EST PT Modifier: Quantity: 1 Physician Procedures : CPT4 Code Description Modifier [id number] - WC PHYS LEVEL 3 - EST PT ICD-10 Diagnosis Description L89.154 Pressure ulcer of sacral region, stage 4 I50.42 Chronic combined systolic (congestive) and diastolic (congestive) heart failure Z95.1 Presence  of aortocoronary bypass graft M46.28 Osteomyelitis of vertebra, sacral and sacrococcygeal region Quantity: 1 Electronic Signature(s) Signed: 12/24/2022 3:34:03 PM By: Joe Shan DO Entered By: Joe [name] on 12/24/2022 09:12:01

## 2022-12-28 ENCOUNTER — Other Ambulatory Visit (HOSPITAL_BASED_OUTPATIENT_CLINIC_OR_DEPARTMENT_OTHER): Payer: Self-pay

## 2022-12-29 NOTE — Telephone Encounter (Signed)
Called patient on mobile number - a male answered the phone and stated patient wasn't there. Left voicemail on home number asking for patient to return my call.   Tomi Bamberger, CMA

## 2022-12-30 ENCOUNTER — Other Ambulatory Visit: Payer: Self-pay

## 2022-12-30 DIAGNOSIS — R7989 Other specified abnormal findings of blood chemistry: Secondary | ICD-10-CM

## 2022-12-30 NOTE — Telephone Encounter (Signed)
Results given to patient mother Guerry Minors (on HIPAA) - lab appointment scheduled on May 1st. [name] will inform patient of this information.   [place], CMA

## 2023-01-04 ENCOUNTER — Other Ambulatory Visit (HOSPITAL_BASED_OUTPATIENT_CLINIC_OR_DEPARTMENT_OTHER): Payer: Self-pay

## 2023-01-07 ENCOUNTER — Encounter (HOSPITAL_BASED_OUTPATIENT_CLINIC_OR_DEPARTMENT_OTHER): Payer: Medicare Other | Admitting: Internal Medicine

## 2023-01-07 DIAGNOSIS — L89154 Pressure ulcer of sacral region, stage 4: Secondary | ICD-10-CM | POA: Diagnosis not present

## 2023-01-07 DIAGNOSIS — M4628 Osteomyelitis of vertebra, sacral and sacrococcygeal region: Secondary | ICD-10-CM

## 2023-01-07 DIAGNOSIS — Z951 Presence of aortocoronary bypass graft: Secondary | ICD-10-CM

## 2023-01-07 DIAGNOSIS — I5042 Chronic combined systolic (congestive) and diastolic (congestive) heart failure: Secondary | ICD-10-CM | POA: Diagnosis not present

## 2023-01-07 NOTE — Progress Notes (Signed)
Chahal, Ary A [id number] Page 1 of 7 Visit Report for 01/07/2023 Chief Complaint Document Details Patient Name: Date of Service: [name], [place] RK A. 01/07/2023 8:00 A M Medical Record Number: [id number] Patient Account Number: [id number] Date of Birth/Sex: Treating RN: Sep 27, 1956 (67 y.o. M) Primary Care Provider: Salena Saner Other Clinician: Referring Provider: Treating Provider/Extender: Ether Griffins in Treatment: 19 Information Obtained from: Patient Chief Complaint 08/27/2022; patient presents for sacral wound Electronic Signature(s) Signed: 01/07/2023 10:46:29 AM By: Kalman Shan DO Entered By: Kalman Shan on 01/07/2023 08:56:12 -------------------------------------------------------------------------------- HPI Details Patient Name: Date of Service: [name], [name] 01/07/2023 8:00 A M Medical Record Number: [id number] Patient Account Number: [id number] Date of Birth/Sex: Treating RN: 09-03-1956 (67 y.o. M) Primary Care Provider: Salena Saner Other Clinician: Referring Provider: Treating Provider/Extender: Ether Griffins in Treatment: 19 History of Present Illness HPI Description: 08/27/2022 Mr. Jovon Letter is a 67 year old male with a past medical history of chronic combined systolic diastolic heart failure, coronary artery disease status post CABG x1, left bundle branch block That presents to the clinic for a sacral ulcer. Patient was admitted to the hospital on 07/08/2022 For 6 weeks and discharged on 08/13/2022 For placement of a bioprosthetic aortic valve due to an ascending aortic aneurysm. During this admission patient states that he developed the sacral ulcer. He is not bedbound and ambulates with a walker. He is currently living with his mother and home health comes out for dressing changes. Currently he is using Medihoney every 3 days. There  is mild odor. Currently denies systemic signs of infection. 11/30; patient presents for follow-up. He has been using Dakin's wet-to-dry dressings mother is present during the encounter. He has no issues or complaints today. He denies signs of infection. 12/14; patient presents for follow-up. He has been using Dakin's wet-to-dry dressings. He has no issues or complaints today. He denies signs of infection. 12/28; patient presents for follow-up. He has been using Dakin's wet-to-dry dressings. He has no issues or complaints today. 1/11; patient presents for follow-up. He had a sacral x-ray done at last clinic visit that showed findings consistent with osteomyelitis to the mid to inferior sacrum. He is scheduled to see infectious disease on 1/15. He has been using Dakin's wet-to-dry dressings. He has chronic pain to the site. He denies systemic signs of infection. 1/25; patient presents for follow-up. He saw Dr. Juleen China with infectious disease on 1/15. Patient was started on a PICC line and plan is for 6 weeks of IV antibiotics. Patient has been using Dakin's wet-to-dry packing strips. 2/15; patient presents for follow-up. He is on week 3 of IV antibiotics. He has been using Dakin's wet-to-dry packing strips. He has no issues or complaints today. 3/7; patient presents for follow-up. He has completed his course of IV antibiotics. He has been using Dakin's wet-to-dry packing strip. He denies any pain to the wound site as he previously had chronic pain. He wants to hold off on doing HBO therapy. 3/14; patient presents for follow-up. We started endoform at last clinic visit. He has done well with this. We also obtained a PCR culture that did not grow bacteria. There was Candida present however this looks to be colonization. Patient has no issues or complaints today. 3/28; patient presents for follow-up. He has been using endoform to the wound bed. There is still a decent tunnel present. He has been approved  for HBO therapy. Due to his echo  showing a left ventricular ejection fraction of less than 20 he will need cardiac clearance to proceed with HBO. This was discussed with the patient and mother. They have an appointment with cardiology next week. Electronic Signature(s) Signed: 01/07/2023 10:46:29 AM By: Kalman Shan DO Entered By: Kalman Shan on 01/07/2023 08:57:12 [place], [place] A ([id number] Page 2 of 7 -------------------------------------------------------------------------------- Physical Exam Details Patient Name: Date of Service: [name], [place] RK A. 01/07/2023 8:00 A M Medical Record Number: [id number] Patient Account Number: [id number] Date of Birth/Sex: Treating RN: 03/10/1956 (67 y.o. M) Primary Care Provider: Salena Saner Other Clinician: Referring Provider: Treating Provider/Extender: Ether Griffins in Treatment: 65 Constitutional respirations regular, non-labored and within target range for patient.. Cardiovascular 2+ dorsalis pedis/posterior tibialis pulses. Psychiatric pleasant and cooperative. Notes Sacral ulcer: Small open wound with granulation tissue at the opening of that tunnels for about 3 cm. Electronic Signature(s) Signed: 01/07/2023 10:46:29 AM By: Kalman Shan DO Entered By: Kalman Shan on 01/07/2023 08:57:30 -------------------------------------------------------------------------------- Physician Orders Details Patient Name: Date of Service: [name], [name] 01/07/2023 8:00 A M Medical Record Number: [id number] Patient Account Number: [id number] Date of Birth/Sex: Treating RN: [birth year] (67 y.o. [name], Meta.Reding Primary Care Provider: Salena Saner Other Clinician: Referring Provider: Treating Provider/Extender: Ether Griffins in Treatment: 48 Verbal / Phone Orders: No Diagnosis Coding ICD-10 Coding Code  Description L89.154 Pressure ulcer of sacral region, stage 4 I50.42 Chronic combined systolic (congestive) and diastolic (congestive) heart failure Z95.1 Presence of aortocoronary bypass graft M46.28 Osteomyelitis of vertebra, sacral and sacrococcygeal region Follow-up Appointments ppointment in 2 weeks. - Dr. Heber Curtice 01/18/2023 0845 room 8 Return A Other: - Ask Cardiologist on 01/13/2023 for cardiac clearance for hyperbaric oxygen therapy. IF cleared by cardiologist go straight and get the chest x-ray. Off-Loading Turn and reposition every 2 hours [facility] wound care orders this week; continue Home Health for wound care. May utilize formulary equivalent dressing for wound treatment orders unless otherwise specified. - [facility] to change weekly. Pt.'s mother will change all other times. primary dressing endoform packing into wound bed. Hyperbaric Oxygen Therapy Wound #1 Sacrum Evaluate for HBO Therapy Indication: - chronic refractory osteomyelitis If appropriate for treatment, begin HBOT per protocol: 2.5 ATA for 90 Minutes with 2 Five (5) Minute A Breaks ir Total Number of Treatments: - 40 A frin (Oxymetazoline HCL) 0.05% nasal spray - 1 spray in both nostrils daily as needed prior to HBO treatment for difficulty clearing ears Wound Treatment Wound #1 - Sacrum Cleanser: Soap and Water ([place]) 1 x Per Day/30 Days Laverdiere, Rune A ([id number] Page 3 of 7 Discharge Instructions: May shower and wash wound with dial antibacterial soap and water prior to dressing change. Cleanser: Wound Cleanser (Home Health) (Generic) 1 x Per Day/30 Days Discharge Instructions: Cleanse the wound with wound cleanser prior to applying a clean dressing using gauze sponges, not tissue or cotton balls. Peri-Wound Care: Skin Prep (Home Health) (Generic) 1 x Per Day/30 Days Discharge Instructions: Use skin prep as directed Prim Dressing: Endoform 2x2 in ([place]) 1 x Per Day/30 Days ary Discharge Instructions: Moisten with saline- pack into wound bed and tunnel. Secondary Dressing: Woven Gauze Sponge, Non-Sterile 4x4 in (Generic) 1 x Per Day/30 Days Discharge Instructions: Apply over primary dressing as directed. Secondary Dressing: Zetuvit Plus Silicone Border Dressing 4x4 (in/in) (Generic) 1 x Per Day/30 Days Discharge Instructions: Apply silicone border over primary dressing as directed. Radiology  X-ray, Chest - chest x-ray for hyperbaric protocol related to history heart and lung issues. CPT code - (ICD10 I50.42 - Chronic combined systolic (congestive) and diastolic (congestive) heart failure) Electronic Signature(s) Signed: 01/07/2023 10:45:39 AM By: Deon Pilling RN, BSN Signed: 01/07/2023 10:46:29 AM By: Kalman Shan DO Entered By: Deon Pilling on 01/07/2023 09:02:05 Prescription 01/07/2023 -------------------------------------------------------------------------------- Froman, Sanchez A. Kalman Shan DO Patient Name: Provider: 1956/08/14 [id number] Date of Birth: NPI#: Jerilynn Mages [id number] Sex: DEA #: [phone] Phone #: License #: [place] Patient Address: [facility] [address] [place], [place] [address] [place], [place] [address] [phone] Allergies codeine; Levaquin Provider's Orders X-ray, Chest - ICD10: I50.42 - chest x-ray for hyperbaric protocol related to history heart and lung issues. CPT code Hand Signature: Date(s): Electronic Signature(s) Signed: 01/07/2023 10:45:39 AM By: Deon Pilling RN, BSN Signed: 01/07/2023 10:46:29 AM By: Kalman Shan DO Entered By: Deon Pilling on 01/07/2023 09:02:06 -------------------------------------------------------------------------------- Problem List Details Patient Name: Date of Service: [name], [name] 01/07/2023 8:00 A M Medical Record Number: [id number] Patient Account Number: [id number] Date of  Birth/Sex: Treating RN: [birth year] (67 y.o. [name] Primary Care Provider: Salena Saner Other Clinician: Referring Provider: Treating Provider/Extender: Ether Griffins in Treatment: [place], [place] A ([id number]) [id number] Page 4 of 7 ICD-10 Encounter Code Description Active Date MDM Diagnosis L89.154 Pressure ulcer of sacral region, stage 4 08/27/2022 No Yes I50.42 Chronic combined systolic (congestive) and diastolic (congestive) heart failure 08/27/2022 No Yes Z95.1 Presence of aortocoronary bypass graft 08/27/2022 No Yes M46.28 Osteomyelitis of vertebra, sacral and sacrococcygeal region 10/22/2022 No Yes Inactive Problems Resolved Problems Electronic Signature(s) Signed: 01/07/2023 10:46:29 AM By: Kalman Shan DO Entered By: Kalman Shan on 01/07/2023 08:55:58 -------------------------------------------------------------------------------- Progress Note Details Patient Name: Date of Service: [name], [name] 01/07/2023 8:00 A M Medical Record Number: [id number] Patient Account Number: [id number] Date of Birth/Sex: Treating RN: [birth year] (67 y.o. M) Primary Care Provider: Salena Saner Other Clinician: Referring Provider: Treating Provider/Extender: Ether Griffins in Treatment: 19 Subjective Chief Complaint Information obtained from Patient 08/27/2022; patient presents for sacral wound History of Present Illness (HPI) 08/27/2022 Mr. Enrike Politis is a 67 year old male with a past medical history of chronic combined systolic diastolic heart failure, coronary artery disease status post CABG x1, left bundle branch block That presents to the clinic for a sacral ulcer. Patient was admitted to the hospital on 07/08/2022 For 6 weeks and discharged on 08/13/2022 For placement of a bioprosthetic aortic valve due to an ascending aortic aneurysm. During  this admission patient states that he developed the sacral ulcer. He is not bedbound and ambulates with a walker. He is currently living with his mother and home health comes out for dressing changes. Currently he is using Medihoney every 3 days. There is mild odor. Currently denies systemic signs of infection. 11/30; patient presents for follow-up. He has been using Dakin's wet-to-dry dressings mother is present during the encounter. He has no issues or complaints today. He denies signs of infection. 12/14; patient presents for follow-up. He has been using Dakin's wet-to-dry dressings. He has no issues or complaints today. He denies signs of infection. 12/28; patient presents for follow-up. He has been using Dakin's wet-to-dry dressings. He has no issues or complaints today. 1/11; patient presents for follow-up. He had a sacral x-ray done at last clinic visit that showed findings consistent with osteomyelitis  to the mid to inferior sacrum. He is scheduled to see infectious disease on 1/15. He has been using Dakin's wet-to-dry dressings. He has chronic pain to the site. He denies systemic signs of infection. 1/25; patient presents for follow-up. He saw Dr. Juleen China with infectious disease on 1/15. Patient was started on a PICC line and plan is for 6 weeks of IV antibiotics. Patient has been using Dakin's wet-to-dry packing strips. 2/15; patient presents for follow-up. He is on week 3 of IV antibiotics. He has been using Dakin's wet-to-dry packing strips. He has no issues or complaints today. 3/7; patient presents for follow-up. He has completed his course of IV antibiotics. He has been using Dakin's wet-to-dry packing strip. He denies any pain to the wound site as he previously had chronic pain. He wants to hold off on doing HBO therapy. 3/14; patient presents for follow-up. We started endoform at last clinic visit. He has done well with this. We also obtained a PCR culture that did not  grow bacteria. There was Candida present however this looks to be colonization. Patient has no issues or complaints today. 3/28; patient presents for follow-up. He has been using endoform to the wound bed. There is still a decent tunnel present. He has been approved for HBO therapy. Due to his echo showing a left ventricular ejection fraction of less than 20 he will need cardiac clearance to proceed with HBO. This was discussed with the patient and mother. They have an appointment with cardiology next week. Inda, Jihan A [phone] Page 5 of 7 Patient History Family History Heart Disease - Mother,Maternal Grandparents. Social History Never smoker, Alcohol Use - Never, Drug Use - No History, Caffeine Use - Never. Medical History Cardiovascular Patient has history of Coronary Artery Disease, Hypertension Musculoskeletal Patient has history of Osteoarthritis - bilateral hips; left shoulder Hospitalization/Surgery History - cardioversions 07/2022. - CABG 07/08/2022. - hip replacements 2015. - 2018 left shoulder surgery. - epicardial pacing lead placement 07/08/2022. Medical A Surgical History Notes nd Constitutional Symptoms (General Health) cognitive deficits congenital brain damage expressive speech delay Cardiovascular PVC LBBB thoracic ascending aortic aneurysm epicardial pacing lead placement Gastrointestinal umbilical hernia Genitourinary kidney stones Objective Constitutional respirations regular, non-labored and within target range for patient.. Vitals Time Taken: 8:03 AM, Height: 67 in, Weight: 160 lbs, BMI: 25.1, Temperature: 98.2 F, Pulse: 53 bpm, Respiratory Rate: 20 breaths/min, Blood Pressure: 112/63 mmHg. Cardiovascular 2+ dorsalis pedis/posterior tibialis pulses. Psychiatric pleasant and cooperative. General Notes: Sacral ulcer: Small open wound with granulation tissue at the opening of that tunnels for about 3  cm. Integumentary (Hair, Skin) Wound #1 status is Open. Original cause of wound was Pressure Injury. The date acquired was: 08/07/2022. The wound has been in treatment 19 weeks. The wound is located on the Sacrum. The wound measures 0.1cm length x 0.1cm width x 0.2cm depth; 0.008cm^2 area and 0.002cm^3 volume. There is Fat Layer (Subcutaneous Tissue) exposed. There is no undermining noted, however, there is tunneling at 11:00 with a maximum distance of 4cm. There is a small amount of serous drainage noted. The wound margin is distinct with the outline attached to the wound base. There is no granulation within the wound bed. There is no necrotic tissue within the wound bed. The periwound skin appearance did not exhibit: Callus, Crepitus, Excoriation, Induration, Rash, Scarring, Dry/Scaly, Maceration, Atrophie Blanche, Cyanosis, Ecchymosis, Hemosiderin Staining, Mottled, Pallor, Rubor, Erythema. Assessment Active Problems ICD-10 Pressure ulcer of sacral region, stage 4 Chronic combined systolic (congestive) and diastolic (congestive)  heart failure Presence of aortocoronary bypass graft Osteomyelitis of vertebra, sacral and sacrococcygeal region Patient's wound is stable. He has been approved for HBO therapy. I recommended proceeding with this as he still has a decent sized tunnel to the wound. For now continue endoform. Will need cardiac clearance and he is seeing his cardiologist next week. If cleared for HBO he will obtain a chest x-ray and start in the next week. Continue aggressive offloading. Plan [place], [place] A ([id number] Page 6 of 7 1. Endoform 2. Aggressive offloading 3. Cardiac clearance for HBO therapy 4. Follow-up in 2 weeks, sooner if starts HBO Electronic Signature(s) Signed: 01/07/2023 10:46:29 AM By: Kalman Shan DO Entered By: Kalman Shan on 01/07/2023  08:58:53 -------------------------------------------------------------------------------- HxROS Details Patient Name: Date of Service: [name], [place] RK A. 01/07/2023 8:00 A M Medical Record Number: [id number] Patient Account Number: [id number] Date of Birth/Sex: Treating RN: Nov 28, 1955 (67 y.o. M) Primary Care Provider: Salena Saner Other Clinician: Referring Provider: Treating Provider/Extender: Ether Griffins in Treatment: 19 Constitutional Symptoms (General Health) Medical History: Past Medical History Notes: cognitive deficits congenital brain damage expressive speech delay Cardiovascular Medical History: Positive for: Coronary Artery Disease; Hypertension Past Medical History Notes: PVC LBBB thoracic ascending aortic aneurysm epicardial pacing lead placement Gastrointestinal Medical History: Past Medical History Notes: umbilical hernia Genitourinary Medical History: Past Medical History Notes: kidney stones Musculoskeletal Medical History: Positive for: Osteoarthritis - bilateral hips; left shoulder Immunizations Pneumococcal Vaccine: Received Pneumococcal Vaccination: No Implantable Devices No devices added Hospitalization / Surgery History Type of Hospitalization/Surgery cardioversions 07/2022 CABG 07/08/2022 hip replacements 2015 2018 left shoulder surgery epicardial pacing lead placement 07/08/2022 Family and Social History Heart Disease: Yes - Mother,Maternal Grandparents; Never smoker; Alcohol Use: Never; Drug Use: No History; Caffeine Use: Never; Financial Concerns: No; Food, Clothing or Shelter Needs: No; Support System Lacking: No; Transportation Concerns: No [place], [place] A ([id number] Page 7 of 7 Electronic Signature(s) Signed: 01/07/2023 10:46:29 AM By: Kalman Shan DO Entered By: Kalman Shan on 01/07/2023  08:57:17 -------------------------------------------------------------------------------- SuperBill Details Patient Name: Date of Service: [name], MA [place] A. 01/07/2023 Medical Record Number: [id number] Patient Account Number: [id number] Date of Birth/Sex: Treating RN: Aug 23, 1956 (67 y.o. M) Primary Care Provider: Salena Saner Other Clinician: Referring Provider: Treating Provider/Extender: Ether Griffins in Treatment: 19 Diagnosis Coding ICD-10 Codes Code Description L89.154 Pressure ulcer of sacral region, stage 4 I50.42 Chronic combined systolic (congestive) and diastolic (congestive) heart failure Z95.1 Presence of aortocoronary bypass graft M46.28 Osteomyelitis of vertebra, sacral and sacrococcygeal region Facility Procedures : CPT4 Code: [id number] Description: 99213 - WOUND CARE VISIT-LEV 3 EST PT Modifier: Quantity: 1 Physician Procedures : CPT4 Code Description Modifier [id number] - WC PHYS LEVEL 3 - EST PT ICD-10 Diagnosis Description L89.154 Pressure ulcer of sacral region, stage 4 I50.42 Chronic combined systolic (congestive) and diastolic (congestive) heart failure Z95.1 Presence  of aortocoronary bypass graft M46.28 Osteomyelitis of vertebra, sacral and sacrococcygeal region Quantity: 1 Electronic Signature(s) Signed: 01/07/2023 10:45:39 AM By: Deon Pilling RN, BSN Signed: 01/07/2023 10:46:29 AM By: Kalman Shan DO Entered By: Deon Pilling on 01/07/2023 09:18:17

## 2023-01-07 NOTE — Progress Notes (Signed)
[place], [place] [name] [phone] Page 1 of 8 Visit Report for 01/07/2023 Arrival Information Details Patient Name: Date of Service: Joe [name], [place] [name]. 01/07/2023 8:00 [name] Medical Record Number: [id number] Patient Account Number: [id number] Date of Birth/Sex: Treating RN: 08-23-1956 (67 y.o. Joe [name] Primary Care Joe [name] Joe: Salena Saner Other Clinician: Referring Joe [name]: Treating Joe [name]/Extender: Ether Griffins in Treatment: 19 Visit Information History Since Last Visit Added or deleted any medications: No Patient Arrived: Ambulatory Any new allergies or adverse reactions: No Arrival Time: 08:04 Had [name] fall or experienced change in No Accompanied By: mother activities of daily living that may affect Transfer Assistance: None risk of falls: Patient Identification Verified: Yes Signs or symptoms of abuse/neglect since last visito No Secondary Verification Process Completed: Yes Hospitalized since last visit: No Patient Requires Transmission-Based Precautions: No Implantable device outside of the clinic excluding No Patient Has Alerts: Yes cellular tissue based products placed in the center Patient Alerts: Patient on Blood Thinner since last visit: PICC LINE RIGHT ARM Has Dressing in Place as Prescribed: Yes NO BP'S RIGHT ARM Pain Present Now: No Electronic Signature(s) Signed: 01/07/2023 10:45:39 AM By: Deon Pilling RN, BSN Entered By: Deon Pilling on 01/07/2023 08:04:40 -------------------------------------------------------------------------------- Clinic Level of Care Assessment Details Patient Name: Date of Service: Joe TI, MA [name]. 01/07/2023 8:00 [name] Medical Record Number: [id number] Patient Account Number: [id number] Date of Birth/Sex: Treating RN: Feb 13, 1956 (67 y.o. Joe [name], Meta.Reding Primary Care Joe [name]: Salena Saner Other Clinician: Referring Joe [name]: Treating Joe [name]/Extender:  Ether Griffins in Treatment: 19 Clinic Level of Care Assessment Items TOOL 4 Quantity Score X- 1 0 Use when only an EandM is performed on FOLLOW-UP visit ASSESSMENTS - Nursing Assessment / Reassessment X- 1 10 Reassessment of Co-morbidities (includes updates in patient status) X- 1 5 Reassessment of Adherence to Treatment Plan ASSESSMENTS - Wound and Skin [name] ssessment / Reassessment X - Simple Wound Assessment / Reassessment - one wound 1 5 []  - 0 Complex Wound Assessment / Reassessment - multiple wounds []  - 0 Dermatologic / Skin Assessment (not related to wound area) ASSESSMENTS - Focused Assessment []  - 0 Circumferential Edema Measurements - multi extremities []  - 0 Nutritional Assessment / Counseling / Intervention []  - 0 Lower Extremity Assessment (monofilament, tuning fork, pulses) []  - 0 Peripheral Arterial Disease Assessment (using hand held doppler) ASSESSMENTS - Ostomy and/or Continence Assessment and Care []  - 0 Incontinence Assessment and Management []  - 0 Ostomy Care Assessment and Management (repouching, etc.) PROCESS - Coordination of Care X - Simple Patient / Family Education for ongoing care 1 15 Joe [name] ([id number] Page 2 of 8 []  - 0 Complex (extensive) Patient / Family Education for ongoing care X- 1 10 Staff obtains Consents, Records, T Results / Process Orders est X- 1 10 Staff telephones HHA, Nursing Homes / Clarify orders / etc []  - 0 Routine Transfer to another Facility (non-emergent condition) []  - 0 Routine Hospital Admission (non-emergent condition) []  - 0 New Admissions / Biomedical engineer / Ordering NPWT Apligraf, etc. , []  - 0 Emergency Hospital Admission (emergent condition) X- 1 10 Simple Discharge Coordination []  - 0 Complex (extensive) Discharge Coordination PROCESS - Special Needs []  - 0 Pediatric / Minor Patient Management []  - 0 Isolation  Patient Management []  - 0 Hearing / Language / Visual special needs []  - 0 Assessment of Community assistance (transportation, D/C planning, etc.) []  - 0 Additional assistance / Altered  mentation []  - 0 Support Surface(s) Assessment (bed, cushion, seat, etc.) INTERVENTIONS - Wound Cleansing / Measurement X - Simple Wound Cleansing - one wound 1 5 []  - 0 Complex Wound Cleansing - multiple wounds X- 1 5 Wound Imaging (photographs - any number of wounds) []  - 0 Wound Tracing (instead of photographs) X- 1 5 Simple Wound Measurement - one wound []  - 0 Complex Wound Measurement - multiple wounds INTERVENTIONS - Wound Dressings X - Small Wound Dressing one or multiple wounds 1 10 []  - 0 Medium Wound Dressing one or multiple wounds []  - 0 Large Wound Dressing one or multiple wounds []  - 0 Application of Medications - topical []  - 0 Application of Medications - injection INTERVENTIONS - Miscellaneous []  - 0 External ear exam []  - 0 Specimen Collection (cultures, biopsies, blood, body fluids, etc.) []  - 0 Specimen(s) / Culture(s) sent or taken to Lab for analysis []  - 0 Patient Transfer (multiple staff / Civil Service fast streamer / Similar devices) []  - 0 Simple Staple / Suture removal (25 or less) []  - 0 Complex Staple / Suture removal (26 or more) []  - 0 Hypo / Hyperglycemic Management (close monitor of Blood Glucose) []  - 0 Ankle / Brachial Index (ABI) - do not check if billed separately X- 1 5 Vital Signs Has the patient been seen at the hospital within the last three years: Yes Total Score: 95 Level Of Care: New/Established - Level 3 Electronic Signature(s) Signed: 01/07/2023 10:45:39 AM By: Deon Pilling RN, BSN Entered By: Deon Pilling on 01/07/2023 09:17:37 Joe [name] ([id number] Page 3 of 8 -------------------------------------------------------------------------------- Encounter Discharge Information Details Patient Name: Date of  Service: Joe [name], [place] [name]. 01/07/2023 8:00 [name] Medical Record Number: [id number] Patient Account Number: [id number] Date of Birth/Sex: Treating RN: 26-Apr-1956 (67 y.o. Joe [name] Primary Care Joe [name]: Salena Saner Other Clinician: Referring Joe [name]: Treating Joe [name]/Extender: Ether Griffins in Treatment: 19 Encounter Discharge Information Items Discharge Condition: Stable Ambulatory Status: Ambulatory Discharge Destination: Home Transportation: Private Auto Accompanied By: mother Schedule Follow-up Appointment: Yes Clinical Summary of Care: Electronic Signature(s) Signed: 01/07/2023 10:45:39 AM By: Deon Pilling RN, BSN Entered By: Deon Pilling on 01/07/2023 09:19:03 -------------------------------------------------------------------------------- Lower Extremity Assessment Details Patient Name: Date of Service: Joe Herald, MA [name]. 01/07/2023 8:00 [name] Medical Record Number: [id number] Patient Account Number: [id number] Date of Birth/Sex: Treating RN: January 11, 1956 (67 y.o. Joe [name] Primary Care Marvetta Vohs: Salena Saner Other Clinician: Referring Oaklynn Stierwalt: Treating Lasaro Primm/Extender: Ether Griffins in Treatment: 19 Electronic Signature(s) Signed: 01/07/2023 10:45:39 AM By: Deon Pilling RN, BSN Entered By: Deon Pilling on 01/07/2023 08:05:10 -------------------------------------------------------------------------------- Multi Wound Chart Details Patient Name: Date of Service: Joe Herald, MA [name]. 01/07/2023 8:00 [name] Medical Record Number: [id number] Patient Account Number: [id number] Date of Birth/Sex: Treating RN: 13-Mar-1956 (67 y.o. M) Primary Care Alexandra Lipps: Salena Saner Other Clinician: Referring Liticia Gasior: Treating Kecia Swoboda/Extender: Ether Griffins in Treatment: 19 Vital Signs Height(in): 67 Pulse(bpm): 53 Weight(lbs): 160 Blood Pressure(mmHg):  112/63 Body Mass Index(BMI): 25.1 Temperature(F): 98.2 Respiratory Rate(breaths/min): 20 [1:Photos:] [N/[name]:N/[name]] Joe [name] ([id number]) [1:Sacrum Wound Location: Pressure Injury Wounding Event: Pressure Ulcer Primary Etiology: Coronary Artery Disease, Comorbid History: Hypertension, Osteoarthritis 08/07/2022 Date Acquired: 19 Weeks of Treatment: Open Wound Status: No Wound Recurrence:  0.1x0.1x0.2 Measurements L x W x D (cm) 0.008 [name] (cm) : rea 0.002 Volume (cm) : 100.00% % Reduction in [name] rea: 100.00% % Reduction in  Volume: 11 Position 1 (o'clock): 4 Maximum Distance 1 (cm): Yes Tunneling: Category/Stage IV Classification: Small  Exudate [name] mount: Serous Exudate Type: amber Exudate Color: Distinct, outline attached Wound Margin: None Present (0%) Granulation [name] mount: None Present (0%) Necrotic [name] mount: Fat Layer (Subcutaneous Tissue): Yes N/[name] Exposed Structures: Fascia: No Tendon:  No Muscle: No Joint: No Bone: No Medium (34-66%) Epithelialization: Excoriation: No Periwound Skin Texture: Induration: No Callus: No Crepitus: No Rash: No Scarring: No Maceration: No Periwound Skin Moisture: Dry/Scaly: No Atrophie Blanche: No Periwound  Skin Color: Cyanosis: No Ecchymosis: No Erythema: No Hemosiderin Staining: No Mottled: No Pallor: No Rubor: No] [N/[name]:N/[name] N/[name] N/[name] N/[name] N/[name] N/[name] N/[name] N/[name] N/[name] N/[name] N/[name] N/[name] N/[name] N/[name] N/[name] N/[name] N/[name] N/[name] N/[name] N/[name] N/[name] N/[name] N/[name] N/[name] N/[name]] Treatment Notes Electronic Signature(s) Signed: 01/07/2023 10:46:29 AM By: Kalman Shan DO Entered By: Kalman Shan on 01/07/2023 08:56:02 -------------------------------------------------------------------------------- Multi-Disciplinary Care Plan Details Patient Name: Date of Service: Joe Herald, MA [name]. 01/07/2023 8:00 [name] Medical Record Number: [id number] Patient Account Number: [id number] Date of Birth/Sex: Treating RN: 08/10/1956 (67 y.o. Joe [name] Primary Care Kramer Hanrahan: Salena Saner Other Clinician: Referring  Bengie Kaucher: Treating Linsie Lupo/Extender: Ether Griffins in Treatment: 19 Active Inactive Wound/Skin Impairment Nursing Diagnoses: Impaired tissue integrity Knowledge deficit related to ulceration/compromised skin integrity Goals: Patient will have [name] decrease in wound volume by X% from date: (specify in notes) Date Initiated: 08/27/2022 Target Resolution Date: 02/12/2023 Goal Status: Active Joe [name] ([id number]) [id number] Page 5 of 8 Patient/caregiver will verbalize understanding of skin care regimen Date Initiated: 08/27/2022 Target Resolution Date: 02/12/2023 Goal Status: Active Ulcer/skin breakdown will have [name] volume reduction of 30% by week 4 Date Initiated: 08/27/2022 Date Inactivated: 12/17/2022 Target Resolution Date: 11/14/2022 Goal Status: Met Interventions: Assess patient/caregiver ability to obtain necessary supplies Assess patient/caregiver ability to perform ulcer/skin care regimen upon admission and as needed Assess ulceration(s) every visit Notes: Electronic Signature(s) Signed: 01/07/2023 10:45:39 AM By: Deon Pilling RN, BSN Entered By: Deon Pilling on 01/07/2023 08:42:49 -------------------------------------------------------------------------------- Pain Assessment Details Patient Name: Date of Service: Joe [name], [place] [name]. 01/07/2023 8:00 [name] Medical Record Number: [id number] Patient Account Number: [id number] Date of Birth/Sex: Treating RN: 09/18/1956 (67 y.o. Joe [name] Primary Care Davionte Lusby: Salena Saner Other Clinician: Referring Muranda Coye: Treating Rutha Melgoza/Extender: Ether Griffins in Treatment: 19 Active Problems Location of Pain Severity and Description of Pain Patient Has Paino No Site Locations Rate the pain. Current Pain Level: 0 Pain Management and Medication Current Pain Management: Medication: No Cold Application: No Rest: No Massage:  No Activity: No T.E.N.S.: No Heat Application: No Leg drop or elevation: No Is the Current Pain Management Adequate: Adequate How does your wound impact your activities of daily livingo Sleep: No Bathing: No Appetite: No Relationship With Others: No Bladder Continence: No Emotions: No Bowel Continence: No Work: No Toileting: No Drive: No Dressing: No Hobbies: No Electronic Signature(s) Signed: 01/07/2023 10:45:39 AM By: Deon Pilling RN, BSN Entered By: Deon Pilling on 01/07/2023 [place], [place] [name] ([id number] Page 6 of 8 -------------------------------------------------------------------------------- Patient/Caregiver Education Details Patient Name: Date of Service: Joe [name], [place] [name]. 3/28/2024andnbsp8:00 [name] Medical Record Number: [id number] Patient Account Number: [id number] Date of Birth/Gender: Treating RN: 24-Jan-1956 (67 y.o. Joe [name] Primary Care Physician: Salena Saner Other Clinician: Referring Physician: Treating Physician/Extender: Ether Griffins in Treatment: 4 Education Assessment Education Provided To: Patient Education Topics Provided Wound/Skin  Impairment: Handouts: Caring for Your Ulcer Methods: Explain/Verbal Responses: Reinforcements needed Electronic Signature(s) Signed: 01/07/2023 10:45:39 AM By: Deon Pilling RN, BSN Entered By: Deon Pilling on 01/07/2023 08:43:00 -------------------------------------------------------------------------------- Wound Assessment Details Patient Name: Date of Service: Joe Herald, MA [name]. 01/07/2023 8:00 [name] Medical Record Number: [id number] Patient Account Number: [id number] Date of Birth/Sex: Treating RN: [birth year] (67 y.o. Joe [name], Meta.Reding Primary Care Sharnee Douglass: Salena Saner Other Clinician: Referring Taimur Fier: Treating Shreshta Medley/Extender: Ether Griffins in Treatment: 19 Wound  Status Wound Number: 1 Primary Etiology: Pressure Ulcer Wound Location: Sacrum Wound Status: Open Wounding Event: Pressure Injury Comorbid History: Coronary Artery Disease, Hypertension, Osteoarthritis Date Acquired: 08/07/2022 Weeks Of Treatment: 19 Clustered Wound: No Photos Wound Measurements Length: (cm) 0.1 Width: (cm) 0.1 Depth: (cm) 0.2 Area: (cm) 0.008 Volume: (cm) 0.002 % Reduction in Area: 100% % Reduction in Volume: 100% Epithelialization: Medium (34-66%) Tunneling: Yes Position (o'clock): 11 Maximum Distance: (cm) 4 Undermining: No Joe [name] ([id number]) [id number] Page 7 of 8 Wound Description Classification: Category/Stage IV Wound Margin: Distinct, outline attached Exudate Amount: Small Exudate Type: Serous Exudate Color: amber Foul Odor After Cleansing: No Slough/Fibrino No Wound Bed Granulation Amount: None Present (0%) Exposed Structure Necrotic Amount: None Present (0%) Fascia Exposed: No Fat Layer (Subcutaneous Tissue) Exposed: Yes Tendon Exposed: No Muscle Exposed: No Joint Exposed: No Bone Exposed: No Periwound Skin Texture Texture Color No Abnormalities Noted: No No Abnormalities Noted: No Callus: No Atrophie Blanche: No Crepitus: No Cyanosis: No Excoriation: No Ecchymosis: No Induration: No Erythema: No Rash: No Hemosiderin Staining: No Scarring: No Mottled: No Pallor: No Moisture Rubor: No No Abnormalities Noted: No Dry / Scaly: No Maceration: No Treatment Notes Wound #1 (Sacrum) Cleanser Soap and Water Discharge Instruction: May shower and wash wound with dial antibacterial soap and water prior to dressing change. Wound Cleanser Discharge Instruction: Cleanse the wound with wound cleanser prior to applying [name] clean dressing using gauze sponges, not tissue or cotton balls. Peri-Wound Care Skin Prep Discharge Instruction: Use skin prep as directed Topical Primary Dressing Endoform 2x2  in Discharge Instruction: Moisten with saline- pack into wound bed and tunnel. Secondary Dressing Woven Gauze Sponge, Non-Sterile 4x4 in Discharge Instruction: Apply over primary dressing as directed. Zetuvit Plus Silicone Border Dressing 4x4 (in/in) Discharge Instruction: Apply silicone border over primary dressing as directed. Secured With Compression Wrap Compression Stockings Environmental education officer) Signed: 01/07/2023 10:45:39 AM By: Deon Pilling RN, BSN Entered By: Deon Pilling on 01/07/2023 08:12:13 -------------------------------------------------------------------------------- Vitals Details Patient Name: Date of Service: Joe Herald, [place] [name]. 01/07/2023 8:00 [name] Nelle Joe [name] ([id number] Page 8 of 8 Medical Record Number: [id number] Patient Account Number: [id number] Date of Birth/Sex: Treating RN: August 01, 1956 (67 y.o. Joe [name], Meta.Reding Primary Care Saman Giddens: Salena Saner Other Clinician: Referring Damien Cisar: Treating Cochise Dinneen/Extender: Ether Griffins in Treatment: 19 Vital Signs Time Taken: 08:03 Temperature (F): 98.2 Height (in): 67 Pulse (bpm): 53 Weight (lbs): 160 Respiratory Rate (breaths/min): 20 Body Mass Index (BMI): 25.1 Blood Pressure (mmHg): 112/63 Reference Range: 80 - 120 mg / dl Electronic Signature(s) Signed: 01/07/2023 10:45:39 AM By: Deon Pilling RN, BSN Entered By: Deon Pilling on 01/07/2023 08:04:53

## 2023-01-08 ENCOUNTER — Encounter (HOSPITAL_COMMUNITY): Payer: Medicare Other | Admitting: Internal Medicine

## 2023-01-12 ENCOUNTER — Ambulatory Visit (HOSPITAL_COMMUNITY)
Admission: RE | Admit: 2023-01-12 | Discharge: 2023-01-12 | Disposition: A | Discharge status: Home / Self Care | Payer: Medicare Other | Source: Ambulatory Visit | Attending: Internal Medicine | Admitting: Internal Medicine

## 2023-01-12 ENCOUNTER — Encounter (HOSPITAL_COMMUNITY): Payer: Self-pay | Admitting: Internal Medicine

## 2023-01-12 ENCOUNTER — Other Ambulatory Visit (HOSPITAL_BASED_OUTPATIENT_CLINIC_OR_DEPARTMENT_OTHER): Payer: Self-pay

## 2023-01-12 VITALS — BP 110/62 | HR 58 | Wt 166.6 lb

## 2023-01-12 DIAGNOSIS — Z7901 Long term (current) use of anticoagulants: Secondary | ICD-10-CM | POA: Insufficient documentation

## 2023-01-12 DIAGNOSIS — I4891 Unspecified atrial fibrillation: Secondary | ICD-10-CM | POA: Insufficient documentation

## 2023-01-12 DIAGNOSIS — I7121 Aneurysm of the ascending aorta, without rupture: Secondary | ICD-10-CM | POA: Diagnosis not present

## 2023-01-12 DIAGNOSIS — I2510 Atherosclerotic heart disease of native coronary artery without angina pectoris: Secondary | ICD-10-CM | POA: Diagnosis not present

## 2023-01-12 DIAGNOSIS — I428 Other cardiomyopathies: Secondary | ICD-10-CM | POA: Insufficient documentation

## 2023-01-12 DIAGNOSIS — I5042 Chronic combined systolic (congestive) and diastolic (congestive) heart failure: Secondary | ICD-10-CM | POA: Insufficient documentation

## 2023-01-12 DIAGNOSIS — I110 Hypertensive heart disease with heart failure: Secondary | ICD-10-CM | POA: Insufficient documentation

## 2023-01-12 DIAGNOSIS — I447 Left bundle-branch block, unspecified: Secondary | ICD-10-CM

## 2023-01-12 DIAGNOSIS — Z79899 Other long term (current) drug therapy: Secondary | ICD-10-CM | POA: Insufficient documentation

## 2023-01-12 DIAGNOSIS — Z87440 Personal history of urinary (tract) infections: Secondary | ICD-10-CM | POA: Diagnosis not present

## 2023-01-12 DIAGNOSIS — Z86718 Personal history of other venous thrombosis and embolism: Secondary | ICD-10-CM | POA: Diagnosis not present

## 2023-01-12 DIAGNOSIS — S31000D Unspecified open wound of lower back and pelvis without penetration into retroperitoneum, subsequent encounter: Secondary | ICD-10-CM | POA: Diagnosis not present

## 2023-01-12 DIAGNOSIS — I340 Nonrheumatic mitral (valve) insufficiency: Secondary | ICD-10-CM

## 2023-01-12 DIAGNOSIS — Z953 Presence of xenogenic heart valve: Secondary | ICD-10-CM | POA: Insufficient documentation

## 2023-01-12 DIAGNOSIS — I480 Paroxysmal atrial fibrillation: Secondary | ICD-10-CM

## 2023-01-12 DIAGNOSIS — X58XXXD Exposure to other specified factors, subsequent encounter: Secondary | ICD-10-CM | POA: Diagnosis not present

## 2023-01-12 DIAGNOSIS — Z951 Presence of aortocoronary bypass graft: Secondary | ICD-10-CM | POA: Diagnosis not present

## 2023-01-12 DIAGNOSIS — Z8249 Family history of ischemic heart disease and other diseases of the circulatory system: Secondary | ICD-10-CM | POA: Diagnosis not present

## 2023-01-12 LAB — BASIC METABOLIC PANEL
Anion gap: 5 (ref 5–15)
BUN: 21 mg/dL (ref 8–23)
CO2: 26 mmol/L (ref 22–32)
Calcium: 9.4 mg/dL (ref 8.9–10.3)
Chloride: 106 mmol/L (ref 98–111)
Creatinine, Ser: 0.95 mg/dL (ref 0.61–1.24)
GFR, Estimated: >60 mL/min (ref >60)
Glucose, Bld: 106 mg/dL — ABNORMAL HIGH (ref 70–99)
Potassium: 4.2 mmol/L (ref 3.5–5.1)
Sodium: 137 mmol/L (ref 135–145)

## 2023-01-12 LAB — BRAIN NATRIURETIC PEPTIDE: B Natriuretic Peptide: 274.4 pg/mL — ABNORMAL HIGH (ref 0.0–100.0)

## 2023-01-12 MED ORDER — SPIRONOLACTONE 25 MG PO TABS
25.0000 mg | ORAL_TABLET | Freq: Every day | ORAL | 3 refills | Status: DC
  Filled 2023-01-12: qty 90, 90d supply, fill #0
  Filled 2023-04-16: qty 90, 90d supply, fill #1
  Filled 2023-08-05: qty 90, 90d supply, fill #2
  Filled 2023-08-14 – 2023-11-26 (×2): qty 90, 90d supply, fill #3

## 2023-01-12 NOTE — Patient Instructions (Signed)
STOP Amiodarone  INCREASE Spironolactone to 25 mg daily.  TAKE 20 mg lasix Monday Wednesday and Friday for 1 week, then go back to 40 mg as needed.  Your physician has requested that you have an echocardiogram. Echocardiography is a painless test that uses sound waves to create images of your heart. It provides your doctor with information about the size and shape of your heart and how well your heart's chambers and valves are working. This procedure takes approximately one hour. There are no restrictions for this procedure. Please do NOT wear cologne, perfume, aftershave, or lotions (deodorant is allowed). Please arrive 15 minutes prior to your appointment time.  Your physician recommends that you schedule a follow-up appointment in: 4 months ( August) ** please call the  office on June to arrange your follow up appointment. **  If you have any questions or concerns before your next appointment please send [place] a message through [place] or call our office at [phone].    TO LEAVE A MESSAGE FOR THE NURSE SELECT OPTION 2, PLEASE LEAVE A MESSAGE INCLUDING: YOUR NAME DATE OF BIRTH CALL BACK NUMBER REASON FOR CALL**this is important as we prioritize the call backs  YOU WILL RECEIVE A CALL BACK THE SAME DAY AS LONG AS YOU CALL BEFORE 4:00 PM  At the [facility], you and your health needs are our priority. As part of our continuing mission to provide you with exceptional heart care, we have created designated Provider Care Teams. These Care Teams include your primary Cardiologist (physician) and Advanced Practice Providers (APPs- Physician Assistants and Nurse Practitioners) who all work together to provide you with the care you need, when you need it.   You may see any of the following providers on your designated Care Team at your next follow up: Dr Glori Bickers Dr Loralie Champagne Dr. Roxana Hires, NP Lyda Jester, [place] [facility] [place], [place] Forestine Na, NP Audry Riles, PharmD   Please be sure to bring in all your medications bottles to every appointment.    Thank you for choosing [facility]

## 2023-01-12 NOTE — Progress Notes (Signed)
ReDS Vest / Clip - 01/12/23 1500       ReDS Vest / Clip   Station Marker C    Ruler Value 27.5    ReDS Value Range Moderate volume overload    ReDS Actual Value 38

## 2023-01-12 NOTE — Progress Notes (Signed)
Advanced Heart Failure Clinic Note   PCP: Willey Blade, MD Primary Cardiologist: Skeet Latch, MD  HF Cardiologist: Dr. Haroldine Laws   Reason for Visit: CHF f/u  HPI: 67 y.o. male with past medication history of mild cognitive impairment and anxiety. He was found to have severe LV dysfunction in setting of markedly dilated aortic root (67 mm) and severe AI (tricuspid AoV). Echo 9/23 EF 25-30%, LV severely dilated, severe AI, Global HK, RV ok. LBBB also noted. [facility] 9/23 showed single vessel CAD w/ 60-70% pRCA stenosis, patent LM, LAD and LCx. RHC w/ low filling pressures and preserved CO, CI 2.53.    Underwent Bentall procedure + CABG x 1, SVR-RCA, on 9/26. Intraoperative TEE showed LVEF 20-25%, RV normal. Normal functioning bioprosthetic AoV w/ no AI and no perivalvular leak post repair. He had a prolonged hospital course as post op recovery was complicated by post-cardiotomy shock slow to wean off pressors, severe ileus/constipation, urinary retention/ obstruction requiring placement of foley catheter, atrial fibrillation requiring DCCV, RUE PICC Line Associated DVT, deconditioning and sacral wound. Home health arranged at discharge. Referred to outpatient wound clinic for ongoing management of sacral wound.   Of note, he was discharged home on 08/13/22. On 11/16 he was diagnosed w/ UTI. Started on abx.   Follow up with EP 11/23, given persistent sacral wound, decision made to defer CRT-D implant until healed given infection risk.   Seen for hospital follow-up 11/22. Mildly volume overloaded. Arlyce Harman increased to 12.5 mg bid. Med titration limited by soft BP.  Seen in ED 09/25/22 with CP. HsT neg x 2, felt pain likely GI in nature. BP was low so Entresto decreased to 24/26, spiro restarted at 12.5 daily.  Post hospital follow up 12/23, doing well NYHA II. Defer CRT until sacral wounds healed.  Saw EP 2/24 to discuss eventual CRT-D implantation. Completed IV abx 12/16/22, then re-discuss  CRT.  Today he returns for HF follow up with his mother, she helps with history due to [facility] cognitive impairment. Overall feeling fine. [facility], walks on [place] and does weight machines without dyspnea. Has a goal to be back riding his bike on the [place]. Has some LE edema today, ate Chik [employer] yesterday. Denies palpitations, CP, dizziness, or PND/Orthopnea. Appetite ok. No fever or chills. Weight at home 161 pounds. Taking all medications. Still has sacral wound with tunneling, planning hyperbaric oxygen chamber soon. He needs cardiac clearance for this. Took Lasix last week, first time in awhile.  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     amiodarone (PACERONE) 200 MG tablet Take 1 tablet (200 mg total) by mouth daily. 90 tablet 2   apixaban (ELIQUIS) 5 MG TABS tablet Take 1 tablet (5 mg total) by mouth 2 (two) times daily. 60 tablet 11   ascorbic acid (VITAMIN C) 500 MG tablet Take 500 mg by mouth daily.     aspirin EC 81 MG tablet Take 1 tablet (81 mg  total) by mouth daily. Swallow whole. 30 tablet 12   atorvastatin (LIPITOR) 80 MG tablet Take 1 tablet (80 mg total) by mouth daily. 90 tablet 2   B Complex Vitamins (VITAMIN B COMPLEX) TABS Take 1 tablet by mouth daily.     Cholecalciferol (VITAMIN D3) 250 MCG (10000 UT) capsule Take 10,000 Units by mouth daily.     Coenzyme Q10 (CO Q-10) 100 MG CAPS Take 100 mg by mouth daily.     fluticasone (FLONASE) 50 MCG/ACT nasal spray Place 2 sprays into both nostrils daily. 16 g 1   furosemide (LASIX) 20 MG tablet Take 2 tablets (40 mg total) by mouth daily as needed for fluid.     Magnesium 300 MG CAPS Take 1 capsule by mouth daily.     OVER THE COUNTER MEDICATION PRUNE JUICE/FLAXSEED     potassium chloride SA (KLOR-CON M) 20 MEQ tablet Take 1 tablet (20 mEq total) by mouth as needed (Take one tab on the days you require lasix). 30 tablet 1   sacubitril-valsartan (ENTRESTO) 24-26 MG Take 1 tablet by mouth 2 (two) times daily. 60 tablet 3   spironolactone (ALDACTONE) 25 MG tablet Take 0.5 tablets (12.5 mg total) by mouth daily. 15 tablet 0   tamsulosin (FLOMAX) 0.4 MG CAPS capsule Take 1 capsule (0.4 mg total) by mouth 2 (two) times daily. 60 capsule 3   Zinc 50 MG TABS Take 1 tablet by mouth daily.     No current facility-administered medications for this encounter.   Allergies  Allergen Reactions   Codeine Nausea Only   Levaquin [Levofloxacin In D5w] Diarrhea      Social History   Socioeconomic History   Marital status: Single    Spouse name: Not on file   Number of children: Not on file   Years of education: Not on file   Highest education level: Not on file  Occupational History   Occupation: unemployed  Tobacco Use   Smoking status: Never   Smokeless tobacco: Never  Vaping Use   Vaping Use: Never used  Substance and Sexual Activity   Alcohol use: No   Drug use: No   Sexual activity: Not Currently  Other Topics Concern   Not on file  Social History Narrative   Pt with  delayed mental capabilities, 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  Lives with parents.   Social Determinants of Health   Financial Resource Strain: Low Risk  (10/11/2019)   Overall Financial Resource Strain (CARDIA)    Difficulty of Paying Living Expenses: Not hard at all  Food Insecurity: No Food Insecurity (09/26/2022)   Hunger Vital Sign    Worried About Running Out of Food in the Last Year: Never true    Ran Out of Food in the Last Year: Never true  Transportation Needs: No Transportation Needs (09/26/2022)  PRAPARE - Hydrologist (Medical): No    Lack of Transportation (Non-Medical): No  Physical Activity: Sufficiently Active (10/11/2019)   Exercise Vital Sign    Days of Exercise per Week: 5 days    Minutes of Exercise per Session: 40 min  Stress: Stress Concern Present (10/11/2019)   [place]    Feeling of Stress : To some extent  Social Connections: Not on file  Intimate Partner Violence: Not At Risk (09/26/2022)   Humiliation, Afraid, Rape, and Kick questionnaire    Fear of Current or Ex-Partner: No    Emotionally Abused: No    Physically Abused: No    Sexually Abused: No   Family History  Problem Relation Age of Onset   Heart failure Mother    Atrial fibrillation Mother    Heart failure Maternal Grandmother    Atrial fibrillation Maternal Grandmother    BP 110/62   Pulse (!) 58   Wt 75.6 kg (166 lb 9.6 oz)   SpO2 94%   BMI 26.09 kg/m   Wt Readings from Last 3 Encounters:  01/12/23 75.6 kg (166 lb 9.6 oz)  12/21/22 75.3 kg (166 lb)  12/01/22 72.1 kg (159 lb)   PHYSICAL EXAM: General:  NAD. No resp difficulty, kyphotic HEENT: Normal Neck: Supple. JVP 8-9 Carotids 2+ bilat; no bruits. No lymphadenopathy or thryomegaly appreciated. Cor: PMI nondisplaced. Regular rate &  rhythm. No rubs, gallops or murmurs. Lungs: Clear Abdomen: Soft, nontender, nondistended. No hepatosplenomegaly. No bruits or masses. Good bowel sounds. Extremities: No cyanosis, clubbing, rash, 1+ BLE edema Neuro: Alert & oriented x 3, cranial nerves grossly intact. Moves all 4 extremities w/o difficulty. Affect pleasant.  ReDs: 38%  ASSESSMENT & PLAN: 1. Chronic Systolic Heart Failure  - newly diagnosed - Echo 9/23 EF 25-30%, LV severely dilated, severe AI, Global HK, RV ok  - NICM, severity out of proportion to degree of CAD, likely valvular CM in setting of severe AI +/- LBBB CM  - s/p bioprosthetic AVR + CABG 9/23; epicardial CRT wires placed during surgery. - Intraoperative TEE LVEF 20-25%, RV ok - Echo (07/21/22): EF 25% RV function improving. Moderate to severe MR (in setting of LBBB) AVR ok   - Echo (10/23):  EF 20% RV moderately reduced. Moderate to severe PI. AVR ok  - Echo (09/03/22): EF < 20%, RV mildly reduced, normal functioning aortic valve prosthesis - NYHA II. Volume up a little today, REDs 38% - Increase spironolactone to 25 mg daily. - Take Lasix 20 mg MWF x 1 week, then use PRN. - Continue Entresto 24/26 mg bid. - With junctional rhythm, hold off on beta blocker and digoxin for now to avoid worsening A-V conduction.  - Avoid SGLT2i given h/o urinary retention, sacral wound and recent UTI.  - Place TED hose - Labs today. Repeat BMET in 7-10 days. - Repeat echo  2. Ascending Aortic Aneurysm/ Severe AI  - CTA  w/ severe dilation at the sinus of Valsalva, 67 mm. Moderate dilation of mid ascending aorta, 44 mm. Tricuspid AoV - s/p Bentall with bioprosthetic aortic valve 9/23. - Intraoperative TEE, no perivalvular leak post repair  - Echo 09/03/22: EF < 20%, RV mildly reduced, normal functioning aortic valve prosthesis.  3. CAD - single vessel, 60-70% pRCA  - s/p CABG x 1, SVG- RCA  - No CP. - Continue ASA + statin  - No BB for now w/ bradycardia.  4. LBBB -  Will eventually need epicardial lead hooked up for CRT, once sacral wound heals.  - Has f/u w/ EP 03/10/23.   5. Atrial fib/flutter, post-op - Required DCCV during hospital stay>>NSR. - CHA2DS2Vasc is 3.  - Regular on exam today. - Continue Eliquis 5 mg bid. No bleeding issues.  - Stop amiodarone.  6. Mitral Regurgitation  - Mod-severe on echo 10/23, in setting of LBBB. - Planning HF optimization w/ CRT-D, as outlined above.    7. Sacral Wound - Followed by wound clinic. - Planning on HBO therapy, needs cards clearance due to low EF. - OK to proceed with HBO therapy from cardiac standpoint. - Will get CXR today.  Follow up in 4 months.  Rafael Bihari, FNP  3:24 PM  Patient seen and examined with the above-signed Advanced Practice Provider and/or Housestaff. I personally reviewed laboratory data, imaging studies and relevant notes. I independently examined the patient and formulated the important aspects of the plan. I have edited the note to reflect any of my changes or salient points. I have personally discussed the plan with the patient and/or family.  Doing very well. Remains active. No CP or SOB. Remains in NSR. Unfortunately still struggling with sacral wound.   General:  Well appearing. No resp difficulty HEENT: normal Neck: supple. JVP 7. Carotids 2+ bilat; no bruits. No lymphadenopathy or thryomegaly appreciated. Cor: PMI nondisplaced. Regular rate & rhythm. No rubs, gallops or murmurs. Lungs: clear Abdomen: soft, nontender, nondistended. No hepatosplenomegaly. No bruits or masses. Good bowel sounds. Extremities: no cyanosis, clubbing, rash, 1+ edema Neuro: alert & orientedx3, cranial nerves grossly intact. moves all 4 extremities w/o difficulty. Affect pleasant  Doing very well NYHA I-II. Mild volume overload on exam. Increase spiro/lasix. Repeat echo   Ok to proceed with hyperbaric oxygen therapy. Once wound healed will need CRT leads hooked up.   Labs today.    Glori Bickers, MD  5:17 PM

## 2023-01-18 ENCOUNTER — Encounter (HOSPITAL_BASED_OUTPATIENT_CLINIC_OR_DEPARTMENT_OTHER): Payer: Medicare Other | Attending: Internal Medicine | Admitting: Internal Medicine

## 2023-01-18 ENCOUNTER — Other Ambulatory Visit (HOSPITAL_BASED_OUTPATIENT_CLINIC_OR_DEPARTMENT_OTHER): Payer: Self-pay

## 2023-01-18 DIAGNOSIS — I5042 Chronic combined systolic (congestive) and diastolic (congestive) heart failure: Secondary | ICD-10-CM | POA: Diagnosis not present

## 2023-01-18 DIAGNOSIS — I110 Hypertensive heart disease with heart failure: Secondary | ICD-10-CM | POA: Diagnosis not present

## 2023-01-18 DIAGNOSIS — Z951 Presence of aortocoronary bypass graft: Secondary | ICD-10-CM | POA: Diagnosis not present

## 2023-01-18 DIAGNOSIS — I2510 Atherosclerotic heart disease of native coronary artery without angina pectoris: Secondary | ICD-10-CM | POA: Diagnosis not present

## 2023-01-18 DIAGNOSIS — M4628 Osteomyelitis of vertebra, sacral and sacrococcygeal region: Secondary | ICD-10-CM | POA: Insufficient documentation

## 2023-01-18 DIAGNOSIS — M8668 Other chronic osteomyelitis, other site: Secondary | ICD-10-CM | POA: Diagnosis not present

## 2023-01-18 DIAGNOSIS — L89154 Pressure ulcer of sacral region, stage 4: Secondary | ICD-10-CM | POA: Diagnosis not present

## 2023-01-18 DIAGNOSIS — I447 Left bundle-branch block, unspecified: Secondary | ICD-10-CM | POA: Diagnosis not present

## 2023-01-18 NOTE — Progress Notes (Addendum)
Piet, Librado A ([id number]) [id number] Page 1 of 7 Visit Report for 01/18/2023 Chief Complaint Document Details Patient Name: Date of Service: Joe DillsREFINA TI, [place] A. 01/18/2023 8:45 A M Medical Record Number: [id number] Patient Account Number: [id number] Date of Birth/Sex: Treating RN: [birth year] (67 y.o. M) Primary Care Provider: [name], [name] Other Clinician: Referring Provider: Treating Provider/Extender: Loetta RoughHoffman, Miette Molenda Shelton, [name] Weeks in Treatment: 20 Information Obtained from: Patient Chief Complaint 08/27/2022; patient presents for sacral wound Electronic Signature(s) Signed: 01/18/2023 1:25:17 PM By: Geralyn CorwinHoffman, Jawan Chavarria DO Entered By: Geralyn CorwinHoffman, Kodi Guerrera on 01/18/2023 10:08:33 -------------------------------------------------------------------------------- HPI Details Patient Name: Date of Service: Joe DillsEFINA TI, [name] 01/18/2023 8:45 A M Medical Record Number: [id number] Patient Account Number: [id number] Date of Birth/Sex: Treating RN: [birth year] (67 y.o. M) Primary Care Provider: [name], [name] Other Clinician: Referring Provider: Treating Provider/Extender: Loetta RoughHoffman, Damar Petit Shelton, [name] Weeks in Treatment: 20 History of Present Illness HPI Description: 08/27/2022 [name] is a 67 year old male with a past medical history of chronic combined systolic diastolic heart failure, coronary artery disease status post CABG x1, left bundle branch block That presents to the clinic for a sacral ulcer. Patient was admitted to the hospital on 07/08/2022 For 6 weeks and discharged on 08/13/2022 For placement of a bioprosthetic aortic valve due to an ascending aortic aneurysm. During this admission patient states that he developed the sacral ulcer. He is not bedbound and ambulates with a walker. He is currently living with his mother and home health comes out for dressing changes. Currently he is using Medihoney every 3 days. There is  mild odor. Currently denies systemic signs of infection. 11/30; patient presents for follow-up. He has been using Dakin's wet-to-dry dressings mother is present during the encounter. He has no issues or complaints today. He denies signs of infection. 12/14; patient presents for follow-up. He has been using Dakin's wet-to-dry dressings. He has no issues or complaints today. He denies signs of infection. 12/28; patient presents for follow-up. He has been using Dakin's wet-to-dry dressings. He has no issues or complaints today. 1/11; patient presents for follow-up. He had a sacral x-ray done at last clinic visit that showed findings consistent with osteomyelitis to the mid to inferior sacrum. He is scheduled to see infectious disease on 1/15. He has been using Dakin's wet-to-dry dressings. He has chronic pain to the site. He denies systemic signs of infection. 1/25; patient presents for follow-up. He saw Dr. Earlene PlaterWallace with infectious disease on 1/15. Patient was started on a PICC line and plan is for 6 weeks of IV antibiotics. Patient has been using Dakin's wet-to-dry packing strips. 2/15; patient presents for follow-up. He is on week 3 of IV antibiotics. He has been using Dakin's wet-to-dry packing strips. He has no issues or complaints today. 3/7; patient presents for follow-up. He has completed his course of IV antibiotics. He has been using Dakin's wet-to-dry packing strip. He denies any pain to the wound site as he previously had chronic pain. He wants to hold off on doing HBO therapy. 3/14; patient presents for follow-up. We started endoform at last clinic visit. He has done well with this. We also obtained a PCR culture that did not grow bacteria. There was Candida present however this looks to be colonization. Patient has no issues or complaints today. 3/28; patient presents for follow-up. He has been using endoform to the wound bed. There is still a decent tunnel present. He has been approved for  HBO therapy. Due to his echo  showing a left ventricular ejection fraction of less than 20 he will need cardiac clearance to proceed with HBO. This was discussed with the patient and mother. They have an appointment with cardiology next week. 4/8; patient presents for follow-up. He has been using endoform to the wound bed. Per mother who changes the dressing there is still a tunnel present. It was hard to appreciate this today however there is still an open wound. He has been approved for HBO therapy. He was cleared by cardiology to proceed. He would like to start treatment. Electronic Signature(s) Signed: 01/18/2023 1:25:17 PM By: Geralyn Corwin DO [name], [name] ([id number]) [id number] Page 2 of 7 Entered By: Geralyn Corwin on 01/18/2023 10:09:28 -------------------------------------------------------------------------------- Physical Exam Details Patient Name: Date of Service: Joe [name], [place] RK A. 01/18/2023 8:45 A M Medical Record Number: [id number] Patient Account Number: [id number] Date of Birth/Sex: Treating RN: May 10, 1956 (67 y.o. M) Primary Care Provider: [name] Other Clinician: Referring Provider: Treating Provider/Extender: [name] in Treatment: 20 Constitutional respirations regular, non-labored and within target range for patient.[name] Kitchen Psychiatric pleasant and cooperative. Notes Sacral ulcer: Small open wound with granulation tissue at the opening. Electronic Signature(s) Signed: 01/18/2023 1:25:17 PM By: Geralyn Corwin DO Entered By: Geralyn Corwin on 01/18/2023 10:11:36 -------------------------------------------------------------------------------- Physician Orders Details Patient Name: Date of Service: Joe Dills, [name] 01/18/2023 8:45 A M Medical Record Number: [id number] Patient Account Number: [id number] Date of Birth/Sex: Treating RN: 06-06-1956 (67 y.o. [name] Primary Care  Provider: [name] Other Clinician: Referring Provider: Treating Provider/Extender: [name] in Treatment: 20 Verbal / Phone Orders: No Diagnosis Coding ICD-10 Coding Code Description L89.154 Pressure ulcer of sacral region, stage 4 I50.42 Chronic combined systolic (congestive) and diastolic (congestive) heart failure Z95.1 Presence of aortocoronary bypass graft M46.28 Osteomyelitis of vertebra, sacral and sacrococcygeal region Follow-up Appointments ppointment in 2 weeks. - 1030 Monday 02/01/2023 room 8 Return A Other: - Start hyperbaric oxygen therapy Tuesday morning at 0800. Off-Loading Turn and reposition every 2 hours Home Health New wound care orders this week; continue Home Health for wound care. May utilize formulary equivalent dressing for wound treatment orders unless otherwise specified. - mother to apply daily over the counter antibiotic ointment and bordered foam. Other Home Health Orders/Instructions: - [facility]. Hyperbaric Oxygen Therapy Wound #1 Sacrum Evaluate for HBO Therapy Indication: - chronic refractory osteomyelitis If appropriate for treatment, begin HBOT per protocol: 2.5 ATA for 90 Minutes with 2 Five (5) Minute A Breaks ir Total Number of Treatments: - 40 A frin (Oxymetazoline HCL) 0.05% nasal spray - 1 spray in both nostrils daily as needed prior to HBO treatment for difficulty clearing ears Wound Treatment Wound #1 - Sacrum Cleanser: Soap and Water (Home Health) 1 x Per Day/30 Days Discharge Instructions: May shower and wash wound with dial antibacterial soap and water prior to dressing change. Bannan, Dexter A ([id number]) [id number] Page 3 of 7 Cleanser: Wound Cleanser (Home Health) (Generic) 1 x Per Day/30 Days Discharge Instructions: Cleanse the wound with wound cleanser prior to applying a clean dressing using gauze sponges, not tissue or cotton  balls. Peri-Wound Care: Skin Prep (Home Health) (Generic) 1 x Per Day/30 Days Discharge Instructions: Use skin prep as directed Topical: Antibiotic ointment 1 x Per Day/30 Days Discharge Instructions: apply a small amount ointment. Secondary Dressing: Woven Gauze Sponge, Non-Sterile 4x4 in (Generic) 1 x Per Day/30 Days Discharge Instructions: Apply over primary dressing as directed. Secondary  Dressing: Zetuvit Plus Silicone Border Dressing 4x4 (in/in) (Generic) 1 x Per Day/30 Days Discharge Instructions: Apply silicone border over primary dressing as directed. Electronic Signature(s) Signed: 01/18/2023 1:25:17 PM By: Geralyn Corwin DO Entered By: Geralyn Corwin on 01/18/2023 10:12:49 -------------------------------------------------------------------------------- Problem List Details Patient Name: Date of Service: Joe Dills, [name] 01/18/2023 8:45 A M Medical Record Number: [id number] Patient Account Number: [id number] Date of Birth/Sex: Treating RN: [birth year] (67 y.o. [name] Primary Care Provider: [name] Other Clinician: Referring Provider: Treating Provider/Extender: [name] in Treatment: 20 Active Problems ICD-10 Encounter Code Description Active Date MDM Diagnosis L89.154 Pressure ulcer of sacral region, stage 4 08/27/2022 No Yes I50.42 Chronic combined systolic (congestive) and diastolic (congestive) heart failure 08/27/2022 No Yes Z95.1 Presence of aortocoronary bypass graft 08/27/2022 No Yes M46.28 Osteomyelitis of vertebra, sacral and sacrococcygeal region 10/22/2022 No Yes M86.60 Other chronic osteomyelitis, unspecified site 01/18/2023 No Yes Inactive Problems Resolved Problems Electronic Signature(s) Signed: 02/01/2023 9:25:08 AM By: Geralyn Corwin DO Previous Signature: 01/18/2023 1:25:17 PM Version By: Geralyn Corwin DO Entered By: Geralyn Corwin on 02/01/2023 09:24:33 Progress Note  Details -------------------------------------------------------------------------------- Benay Spice ([id number]) [id number] Page 4 of 7 Patient Name: Date of Service: Joe [name], [place] RK A. 01/18/2023 8:45 A M Medical Record Number: [id number] Patient Account Number: [id number] Date of Birth/Sex: Treating RN: Oct 01, 1956 (67 y.o. M) Primary Care Provider: [name] Other Clinician: Referring Provider: Treating Provider/Extender: [name] in Treatment: 20 Subjective Chief Complaint Information obtained from Patient 08/27/2022; patient presents for sacral wound History of Present Illness (HPI) 08/27/2022 Mr. Mohamed Portlock is a 67 year old male with a past medical history of chronic combined systolic diastolic heart failure, coronary artery disease status post CABG x1, left bundle branch block That presents to the clinic for a sacral ulcer. Patient was admitted to the hospital on 07/08/2022 For 6 weeks and discharged on 08/13/2022 For placement of a bioprosthetic aortic valve due to an ascending aortic aneurysm. During this admission patient states that he developed the sacral ulcer. He is not bedbound and ambulates with a walker. He is currently living with his mother and home health comes out for dressing changes. Currently he is using Medihoney every 3 days. There is mild odor. Currently denies systemic signs of infection. 11/30; patient presents for follow-up. He has been using Dakin's wet-to-dry dressings mother is present during the encounter. He has no issues or complaints today. He denies signs of infection. 12/14; patient presents for follow-up. He has been using Dakin's wet-to-dry dressings. He has no issues or complaints today. He denies signs of infection. 12/28; patient presents for follow-up. He has been using Dakin's wet-to-dry dressings. He has no issues or complaints today. 1/11; patient presents for  follow-up. He had a sacral x-ray done at last clinic visit that showed findings consistent with osteomyelitis to the mid to inferior sacrum. He is scheduled to see infectious disease on 1/15. He has been using Dakin's wet-to-dry dressings. He has chronic pain to the site. He denies systemic signs of infection. 1/25; patient presents for follow-up. He saw Dr. Earlene Plater with infectious disease on 1/15. Patient was started on a PICC line and plan is for 6 weeks of IV antibiotics. Patient has been using Dakin's wet-to-dry packing strips. 2/15; patient presents for follow-up. He is on week 3 of IV antibiotics. He has been using Dakin's wet-to-dry packing strips. He has no issues or complaints today. 3/7; patient presents for follow-up.  He has completed his course of IV antibiotics. He has been using Dakin's wet-to-dry packing strip. He denies any pain to the wound site as he previously had chronic pain. He wants to hold off on doing HBO therapy. 3/14; patient presents for follow-up. We started endoform at last clinic visit. He has done well with this. We also obtained a PCR culture that did not grow bacteria. There was Candida present however this looks to be colonization. Patient has no issues or complaints today. 3/28; patient presents for follow-up. He has been using endoform to the wound bed. There is still a decent tunnel present. He has been approved for HBO therapy. Due to his echo showing a left ventricular ejection fraction of less than 20 he will need cardiac clearance to proceed with HBO. This was discussed with the patient and mother. They have an appointment with cardiology next week. 4/8; patient presents for follow-up. He has been using endoform to the wound bed. Per mother who changes the dressing there is still a tunnel present. It was hard to appreciate this today however there is still an open wound. He has been approved for HBO therapy. He was cleared by cardiology to proceed. He  would like to start treatment. Patient History Family History Heart Disease - Mother,Maternal Grandparents. Social History Never smoker, Alcohol Use - Never, Drug Use - No History, Caffeine Use - Never. Medical History Cardiovascular Patient has history of Coronary Artery Disease, Hypertension Musculoskeletal Patient has history of Osteoarthritis - bilateral hips; left shoulder Hospitalization/Surgery History - cardioversions 07/2022. - CABG 07/08/2022. - hip replacements 2015. - 2018 left shoulder surgery. - epicardial pacing lead placement 07/08/2022. Medical A Surgical History Notes nd Constitutional Symptoms (General Health) cognitive deficits congenital brain damage expressive speech delay Cardiovascular PVC LBBB thoracic ascending aortic aneurysm epicardial pacing lead placement Gastrointestinal umbilical hernia Genitourinary kidney stones Portela, Paeton A ([id number]) [id number] Page 5 of 7 Objective Constitutional respirations regular, non-labored and within target range for patient.. Vitals Time Taken: 8:28 AM, Height: 67 in, Weight: 160 lbs, BMI: 25.1, Temperature: 98.7 F, Pulse: 61 bpm, Respiratory Rate: 20 breaths/min, Blood Pressure: 113/71 mmHg. Psychiatric pleasant and cooperative. General Notes: Sacral ulcer: Small open wound with granulation tissue at the opening. Integumentary (Hair, Skin) Wound #1 status is Open. Original cause of wound was Pressure Injury. The date acquired was: 08/07/2022. The wound has been in treatment 20 weeks. The wound is located on the Sacrum. The wound measures 0.2cm length x 0.2cm width x 0.2cm depth; 0.031cm^2 area and 0.006cm^3 volume. There is Fat Layer (Subcutaneous Tissue) exposed. There is no tunneling or undermining noted. There is a none present amount of drainage noted. The wound margin is distinct with the outline attached to the wound base. There is no granulation within the wound bed. There is  no necrotic tissue within the wound bed. The periwound skin appearance did not exhibit: Callus, Crepitus, Excoriation, Induration, Rash, Scarring, Dry/Scaly, Maceration, Atrophie Blanche, Cyanosis, Ecchymosis, Hemosiderin Staining, Mottled, Pallor, Rubor, Erythema. Assessment Active Problems ICD-10 Pressure ulcer of sacral region, stage 4 Chronic combined systolic (congestive) and diastolic (congestive) heart failure Presence of aortocoronary bypass graft Osteomyelitis of vertebra, sacral and sacrococcygeal region Patient's wound has shown improvement in size in appearance since last clinic visit. He has been approved for HBO therapy and this will be helpful to try and help aid in wound healing. He is scheduled to start tomorrow. He has been cleared by cardiology to proceed. For now I recommended antibiotic ointment And keep  the area covered. Plan Follow-up Appointments: Return Appointment in 2 weeks. - 1030 Monday 02/01/2023 room 8 Other: - Start hyperbaric oxygen therapy Tuesday morning at 0800. Off-Loading: Turn and reposition every 2 hours Home Health: New wound care orders this week; continue Home Health for wound care. May utilize formulary equivalent dressing for wound treatment orders unless otherwise specified. - mother to apply daily over the counter antibiotic ointment and bordered foam. Other Home Health Orders/Instructions: - [facility]. Hyperbaric Oxygen Therapy: Wound #1 Sacrum: Evaluate for HBO Therapy Indication: - chronic refractory osteomyelitis If appropriate for treatment, begin HBOT per protocol: 2.5 ATA for 90 Minutes with 2 Five (5) Minute Air Breaks T Number of Treatments: - 40 otal Afrin (Oxymetazoline HCL) 0.05% nasal spray - 1 spray in both nostrils daily as needed prior to HBO treatment for difficulty clearing ears WOUND #1: - Sacrum Wound Laterality: Cleanser: Soap and Water (Home Health) 1 x Per Day/30 Days Discharge Instructions: May shower  and wash wound with dial antibacterial soap and water prior to dressing change. Cleanser: Wound Cleanser (Home Health) (Generic) 1 x Per Day/30 Days Discharge Instructions: Cleanse the wound with wound cleanser prior to applying a clean dressing using gauze sponges, not tissue or cotton balls. Peri-Wound Care: Skin Prep (Home Health) (Generic) 1 x Per Day/30 Days Discharge Instructions: Use skin prep as directed Topical: Antibiotic ointment 1 x Per Day/30 Days Discharge Instructions: apply a small amount ointment. Secondary Dressing: Woven Gauze Sponge, Non-Sterile 4x4 in (Generic) 1 x Per Day/30 Days Discharge Instructions: Apply over primary dressing as directed. Secondary Dressing: Zetuvit Plus Silicone Border Dressing 4x4 (in/in) (Generic) 1 x Per Day/30 Days Discharge Instructions: Apply silicone border over primary dressing as directed. 1. Start HBO therapy 2. Antibiotic ointment 3. Follow-up in 2 weeks 4. Aggressive offloading Herrman, Gaston A ([id number]) [phone].pdf Page 6 of 7 Electronic Signature(s) Signed: 01/18/2023 1:25:17 PM By: Geralyn Corwin DO Entered By: Geralyn Corwin on 01/18/2023 10:13:58 -------------------------------------------------------------------------------- HxROS Details Patient Name: Date of Service: Joe Dills, [name] 01/18/2023 8:45 A M Medical Record Number: [id number] Patient Account Number: [id number] Date of Birth/Sex: Treating RN: [birth year] (67 y.o. M) Primary Care Provider: [name] Other Clinician: Referring Provider: Treating Provider/Extender: [name] in Treatment: 20 Constitutional Symptoms (General Health) Medical History: Past Medical History Notes: cognitive deficits congenital brain damage expressive speech delay Cardiovascular Medical History: Positive for: Coronary Artery Disease; Hypertension Past Medical History Notes: PVC LBBB thoracic ascending  aortic aneurysm epicardial pacing lead placement Gastrointestinal Medical History: Past Medical History Notes: umbilical hernia Genitourinary Medical History: Past Medical History Notes: kidney stones Musculoskeletal Medical History: Positive for: Osteoarthritis - bilateral hips; left shoulder Immunizations Pneumococcal Vaccine: Received Pneumococcal Vaccination: No Implantable Devices No devices added Hospitalization / Surgery History Type of Hospitalization/Surgery cardioversions 07/2022 CABG 07/08/2022 hip replacements 2015 2018 left shoulder surgery epicardial pacing lead placement 07/08/2022 Family and Social History Heart Disease: Yes - Mother,Maternal Grandparents; Never smoker; Alcohol Use: Never; Drug Use: No History; Caffeine Use: Never; Financial Concerns: No; Food, Clothing or Shelter Needs: No; Support System Lacking: No; Transportation Concerns: No Electronic Signature(s) Signed: 01/18/2023 1:25:17 PM By: Geralyn Corwin DO Entered By: Geralyn Corwin on 01/18/2023 10:10:53 Batiz, Quantrell A ([id number]) [id number] Page 7 of 7 -------------------------------------------------------------------------------- SuperBill Details Patient Name: Date of Service: Joe [name], [place] RK A. 01/18/2023 Medical Record Number: [id number] Patient Account Number: [id number] Date of Birth/Sex: Treating RN: 03-11-1956 (67 y.o. [name] Primary Care Provider: [name]  Other Clinician: Referring Provider: Treating Provider/Extender: [name] in Treatment: 20 Diagnosis Coding ICD-10 Codes Code Description L89.154 Pressure ulcer of sacral region, stage 4 I50.42 Chronic combined systolic (congestive) and diastolic (congestive) heart failure Z95.1 Presence of aortocoronary bypass graft M46.28 Osteomyelitis of vertebra, sacral and sacrococcygeal region Facility Procedures : CPT4 Code: [id number] Description:  99213 - WOUND CARE VISIT-LEV 3 EST PT Modifier: Quantity: 1 Physician Procedures : CPT4 Code Description Modifier [id number] - WC PHYS LEVEL 3 - EST PT ICD-10 Diagnosis Description L89.154 Pressure ulcer of sacral region, stage 4 I50.42 Chronic combined systolic (congestive) and diastolic (congestive) heart failure Z95.1 Presence  of aortocoronary bypass graft M46.28 Osteomyelitis of vertebra, sacral and sacrococcygeal region Quantity: 1 Electronic Signature(s) Signed: 01/18/2023 1:25:17 PM By: Geralyn Corwin DO Entered By: Geralyn Corwin on 01/18/2023 10:14:19

## 2023-01-18 NOTE — Progress Notes (Signed)
Bischoff, Lucero [name] ([id number]) [id number] Page 1 of 8 Visit Report for 01/18/2023 Arrival Information Details Patient Name: Date of Service: [name], [place] [name]. 01/18/2023 8:45 [name] Medical Record Number: [id number] Patient Account Number: [id number] Date of Birth/Sex: Treating RN: 10/01/1956 (67 y.o. Tammy SoursM) Deaton, Bobbi Primary Care [name]: [name], Joe R Other Clinician: Referring Tannar Broker: Treating [name], Jessica [name], Joe R Weeks in Treatment: 20 Visit Information History Since Last Visit All ordered tests and consults were completed: Yes Patient Arrived: Ambulatory Added or deleted any medications: No Arrival Time: 08:28 Any new allergies or adverse reactions: No Accompanied By: mother Had [name] fall or experienced change in No Transfer Assistance: None activities of daily living that may affect Patient Identification Verified: Yes risk of falls: Secondary Verification Process Completed: Yes Signs or symptoms of abuse/neglect since last visito No Patient Requires Transmission-Based Precautions: No Hospitalized since last visit: No Patient Has Alerts: Yes Implantable device outside of the clinic excluding No Patient Alerts: Patient on Blood Thinner cellular tissue based products placed in the center PICC LINE RIGHT ARM since last visit: NO BP'S RIGHT ARM Has Dressing in Place as Prescribed: Yes Pain Present Now: No Electronic Signature(s) Signed: 01/18/2023 5:03:18 PM By: Shawn Stalleaton, Bobbi RN, BSN Entered By: Shawn Stalleaton, Bobbi on 01/18/2023 08:28:29 -------------------------------------------------------------------------------- Clinic Level of Care Assessment Details Patient Name: Date of Service: Joe [name]. 01/18/2023 8:45 [name] Medical Record Number: [id number] Patient Account Number: [id number] Date of Birth/Sex: Treating RN: 10/01/1956 (67 y.o. Harlon FlorM) Deaton, Millard.LoaBobbi Primary Care Ervie Mccard: [name], Joe R Other  Clinician: Referring Ashad Fawbush: Treating [name], Jessica [name], Joe R Weeks in Treatment: 20 Clinic Level of Care Assessment Items TOOL 4 Quantity Score X- 1 0 Use when only an EandM is performed on FOLLOW-UP visit ASSESSMENTS - Nursing Assessment / Reassessment X- 1 10 Reassessment of Co-morbidities (includes updates in patient status) X- 1 5 Reassessment of Adherence to Treatment Plan ASSESSMENTS - Wound and Skin [name] ssessment / Reassessment X - Simple Wound Assessment / Reassessment - one wound 1 5 []  - 0 Complex Wound Assessment / Reassessment - multiple wounds []  - 0 Dermatologic / Skin Assessment (not related to wound area) ASSESSMENTS - Focused Assessment []  - 0 Circumferential Edema Measurements - multi extremities []  - 0 Nutritional Assessment / Counseling / Intervention []  - 0 Lower Extremity Assessment (monofilament, tuning fork, pulses) []  - 0 Peripheral Arterial Disease Assessment (using hand held doppler) ASSESSMENTS - Ostomy and/or Continence Assessment and Care []  - 0 Incontinence Assessment and Management []  - 0 Ostomy Care Assessment and Management (repouching, etc.) PROCESS - Coordination of Care Joe [name], Joe [name] ([id number]) [id number] Page 2 of 8 X- 1 15 Simple Patient / Family Education for ongoing care []  - 0 Complex (extensive) Patient / Family Education for ongoing care X- 1 10 Staff obtains ChiropractorConsents, Records, T Results / Process Orders est []  - 0 Staff telephones HHA, Nursing Homes / Clarify orders / etc []  - 0 Routine Transfer to another Facility (non-emergent condition) []  - 0 Routine Hospital Admission (non-emergent condition) []  - 0 New Admissions / Manufacturing engineernsurance Authorizations / Ordering NPWT Apligraf, etc. , []  - 0 Emergency Hospital Admission (emergent condition) X- 1 10 Simple Discharge Coordination []  - 0 Complex (extensive) Discharge Coordination PROCESS - Special Needs []  -  0 Pediatric / Minor Patient Management []  - 0 Isolation Patient Management []  - 0 Hearing / Language / Visual special needs []  - 0 Assessment of Community assistance (transportation, D/C planning, etc.) []  -  0 Additional assistance / Altered mentation []  - 0 Support Surface(s) Assessment (bed, cushion, seat, etc.) INTERVENTIONS - Wound Cleansing / Measurement X - Simple Wound Cleansing - one wound 1 5 []  - 0 Complex Wound Cleansing - multiple wounds X- 1 5 Wound Imaging (photographs - any number of wounds) []  - 0 Wound Tracing (instead of photographs) X- 1 5 Simple Wound Measurement - one wound []  - 0 Complex Wound Measurement - multiple wounds INTERVENTIONS - Wound Dressings X - Small Wound Dressing one or multiple wounds 1 10 []  - 0 Medium Wound Dressing one or multiple wounds []  - 0 Large Wound Dressing one or multiple wounds []  - 0 Application of Medications - topical []  - 0 Application of Medications - injection INTERVENTIONS - Miscellaneous []  - 0 External ear exam []  - 0 Specimen Collection (cultures, biopsies, blood, body fluids, etc.) []  - 0 Specimen(s) / Culture(s) sent or taken to Lab for analysis []  - 0 Patient Transfer (multiple staff / Nurse, adult / Similar devices) []  - 0 Simple Staple / Suture removal (25 or less) []  - 0 Complex Staple / Suture removal (26 or more) []  - 0 Hypo / Hyperglycemic Management (close monitor of Blood Glucose) []  - 0 Ankle / Brachial Index (ABI) - do not check if billed separately X- 1 5 Vital Signs Has the patient been seen at the hospital within the last three years: Yes Total Score: 85 Level Of Care: New/Established - Level 3 Electronic Signature(s) Signed: 01/18/2023 5:03:18 PM By: Shawn Stall RN, BSN Grams, Rondey [name] ([id number]) [id number] Page 3 of 8 Entered By: Shawn Stall on 01/18/2023  09:20:54 -------------------------------------------------------------------------------- Encounter Discharge Information Details Patient Name: Date of Service: Joe [name], [place] [name]. 01/18/2023 8:45 [name] Medical Record Number: [id number] Patient Account Number: [id number] Date of Birth/Sex: Treating RN: 11-May-1956 (67 y.o. [name] Primary Care Gwenetta Devos: [name] Other Clinician: Referring Donyetta Ogletree: Treating Chastin Garlitz/Extender: [name] in Treatment: 20 Encounter Discharge Information Items Discharge Condition: Stable Ambulatory Status: Ambulatory Discharge Destination: Home Transportation: Private Auto Accompanied By: mother Schedule Follow-up Appointment: Yes Clinical Summary of Care: Electronic Signature(s) Signed: 01/18/2023 5:03:18 PM By: Shawn Stall RN, BSN Entered By: Shawn Stall on 01/18/2023 09:21:29 -------------------------------------------------------------------------------- Lower Extremity Assessment Details Patient Name: Date of Service: Joe Dills, Joe [name]. 01/18/2023 8:45 [name] Medical Record Number: [id number] Patient Account Number: [id number] Date of Birth/Sex: Treating RN: 10-Aug-1956 (67 y.o. [name] Primary Care Jarquavious Fentress: [name] Other Clinician: Referring Lazer Wollard: Treating Bella Brummet/Extender: [name] in Treatment: 20 Electronic Signature(s) Signed: 01/18/2023 5:03:18 PM By: Shawn Stall RN, BSN Entered By: Shawn Stall on 01/18/2023 08:28:51 -------------------------------------------------------------------------------- Multi Wound Chart Details Patient Name: Date of Service: Joe Dills, Joe [name]. 01/18/2023 8:45 [name] Medical Record Number: [id number] Patient Account Number: [id number] Date of Birth/Sex: Treating RN: [birth year] (67 y.o. M) Primary Care Benjerman Molinelli: [name] Other Clinician: Referring Maxum Cassarino: Treating Madylyn Insco/Extender: [name] in Treatment: 20 Vital Signs Height(in): 67 Pulse(bpm): 61 Weight(lbs): 160 Blood Pressure(mmHg): 113/71 Body Mass Index(BMI): 25.1 Temperature(F): 98.7 Respiratory Rate(breaths/min): 20 [1:Photos:] [N/[name]:N/[name]] Sacrum N/[name] N/[name] Wound Location: Pressure Injury N/[name] N/[name] Wounding Event: Pressure Ulcer N/[name] N/[name] Primary Etiology: Coronary Artery Disease, N/[name] N/[name] Comorbid History: Hypertension, Osteoarthritis 08/07/2022 N/[name] N/[name] Date Acquired: 20 N/[name] N/[name] Weeks of Treatment: Open N/[name] N/[name] Wound Status: No N/[name] N/[name] Wound Recurrence: 0.2x0.2x0.2 N/[name] N/[name] Measurements L x W x D (cm) 0.031  N/[name] N/[name] [name] (cm) : rea 0.006 N/[name] N/[name] Volume (cm) : 99.80% N/[name] N/[name] % Reduction in [name] rea: 100.00% N/[name] N/[name] % Reduction in Volume: Category/Stage IV N/[name] N/[name] Classification: None Present N/[name] N/[name] Exudate [name] mount: Distinct, outline attached N/[name] N/[name] Wound Margin: None Present (0%) N/[name] N/[name] Granulation [name] mount: None Present (0%) N/[name] N/[name] Necrotic [name] mount: Fat Layer (Subcutaneous Tissue): Yes N/[name] N/[name] Exposed Structures: Fascia: No Tendon: No Muscle: No Joint: No Bone: No Large (67-100%) N/[name] N/[name] Epithelialization: Excoriation: No N/[name] N/[name] Periwound Skin Texture: Induration: No Callus: No Crepitus: No Rash: No Scarring: No Maceration: No N/[name] N/[name] Periwound Skin Moisture: Dry/Scaly: No Atrophie Blanche: No N/[name] N/[name] Periwound Skin Color: Cyanosis: No Ecchymosis: No Erythema: No Hemosiderin Staining: No Mottled: No Pallor: No Rubor: No Treatment Notes Wound #1 (Sacrum) Cleanser Soap and Water Discharge Instruction: May shower and wash wound with dial antibacterial soap and water prior to dressing change. Wound Cleanser Discharge Instruction: Cleanse the wound with wound cleanser prior to applying [name] clean dressing using gauze sponges, not tissue or cotton balls. Peri-Wound Care Skin Prep Discharge Instruction: Use skin prep as  directed Topical Antibiotic ointment Discharge Instruction: apply [name] small amount ointment. Primary Dressing Secondary Dressing Woven Gauze Sponge, Non-Sterile 4x4 in Discharge Instruction: Apply over primary dressing as directed. Zetuvit Plus Silicone Border Dressing 4x4 (in/in) Discharge Instruction: Apply silicone border over primary dressing as directed. Secured With Compression Wrap Compression Stockings Add-Ons Electronic Signature(s) Signed: 01/18/2023 1:25:17 PM By: Geralyn Corwin DO Joe [name],Signed: 01/18/2023 1:25:17 PM By: Geralyn Corwin DO Hyden [name] ([id number]) [id number] Page 5 of 8 Entered By: Geralyn Corwin on 01/18/2023 10:08:08 -------------------------------------------------------------------------------- Multi-Disciplinary Care Plan Details Patient Name: Date of Service: Joe [name], [place] [name]. 01/18/2023 8:45 [name] Medical Record Number: [id number] Patient Account Number: [id number] Date of Birth/Sex: Treating RN: 1956-06-12 (66 y.o. [name] Primary Care Jaron Czarnecki: [name] Other Clinician: Referring Dwanda Tufano: Treating Kelsy Polack/Extender: [name] in Treatment: 20 Active Inactive Wound/Skin Impairment Nursing Diagnoses: Impaired tissue integrity Knowledge deficit related to ulceration/compromised skin integrity Goals: Patient will have [name] decrease in wound volume by X% from date: (specify in notes) Date Initiated: 08/27/2022 Target Resolution Date: 02/12/2023 Goal Status: Active Patient/caregiver will verbalize understanding of skin care regimen Date Initiated: 08/27/2022 Target Resolution Date: 02/12/2023 Goal Status: Active Ulcer/skin breakdown will have [name] volume reduction of 30% by week 4 Date Initiated: 08/27/2022 Date Inactivated: 12/17/2022 Target Resolution Date: 11/14/2022 Goal Status: Met Interventions: Assess patient/caregiver ability to obtain necessary supplies Assess  patient/caregiver ability to perform ulcer/skin care regimen upon admission and as needed Assess ulceration(s) every visit Notes: Electronic Signature(s) Signed: 01/18/2023 5:03:18 PM By: Shawn Stall RN, BSN Entered By: Shawn Stall on 01/18/2023 08:53:55 -------------------------------------------------------------------------------- Pain Assessment Details Patient Name: Date of Service: Joe Dills, Joe [name]. 01/18/2023 8:45 [name] Medical Record Number: [id number] Patient Account Number: [id number] Date of Birth/Sex: Treating RN: 03/01/1956 (67 y.o. [name] Primary Care Madgeline Rayo: [name] Other Clinician: Referring Sudais Banghart: Treating Blaine Guiffre/Extender: [name] in Treatment: 20 Active Problems Location of Pain Severity and Description of Pain Patient Has Paino No Site Locations [facility], [facility] [name] ([id number]) [phone].pdf Page 6 of 8 Pain Management and Medication Current Pain Management: Electronic Signature(s) Signed: 01/18/2023 5:03:18 PM By: Shawn Stall RN, BSN Entered By: Shawn Stall on 01/18/2023 08:28:45 -------------------------------------------------------------------------------- Patient/Caregiver Education Details Patient Name: Date of Service: Joe Dills, Joe [name]. 4/8/2024andnbsp8:45 [name] Medical Record Number: [id number]  Patient Account Number: [id number] Date of Birth/Gender: Treating RN: [birth year] (67 y.o. [name] Primary Care Physician: [name] Other Clinician: Referring Physician: Treating Physician/Extender: [name] in Treatment: 20 Education Assessment Education Provided To: Patient Education Topics Provided Wound/Skin Impairment: Handouts: Caring for Your Ulcer Methods: Explain/Verbal Responses: Reinforcements needed Electronic Signature(s) Signed: 01/18/2023 5:03:18 PM By: Shawn Stall RN, BSN Entered By: Shawn Stall on  01/18/2023 08:54:10 -------------------------------------------------------------------------------- Wound Assessment Details Patient Name: Date of Service: Joe Dills, Joe [name]. 01/18/2023 8:45 [name] Medical Record Number: [id number] Patient Account Number: [id number] Date of Birth/Sex: Treating RN: 09/19/1956 (67 y.o. [name] Primary Care Nance Mccombs: [name] Other Clinician: Referring Lars Jeziorski: Treating Xayden Linsey/Extender: [name] in Treatment: 20 Wound Status Wound Number: 1 Primary Etiology: Pressure Ulcer Wound Location: Sacrum Wound Status: Open Wounding Event: Pressure Injury Comorbid History: Coronary Artery Disease, Hypertension, Osteoarthritis Date Acquired: 08/07/2022 Weeks Of Treatment: 20 Rost, Franco [name] ([id number]) [id number] Page 7 of 8 Clustered Wound: No Photos Wound Measurements Length: (cm) 0.2 Width: (cm) 0.2 Depth: (cm) 0.2 Area: (cm) 0.031 Volume: (cm) 0.006 % Reduction in Area: 99.8% % Reduction in Volume: 100% Epithelialization: Large (67-100%) Tunneling: No Undermining: No Wound Description Classification: Category/Stage IV Wound Margin: Distinct, outline attached Exudate Amount: None Present Foul Odor After Cleansing: No Slough/Fibrino No Wound Bed Granulation Amount: None Present (0%) Exposed Structure Necrotic Amount: None Present (0%) Fascia Exposed: No Fat Layer (Subcutaneous Tissue) Exposed: Yes Tendon Exposed: No Muscle Exposed: No Joint Exposed: No Bone Exposed: No Periwound Skin Texture Texture Color No Abnormalities Noted: No No Abnormalities Noted: No Callus: No Atrophie Blanche: No Crepitus: No Cyanosis: No Excoriation: No Ecchymosis: No Induration: No Erythema: No Rash: No Hemosiderin Staining: No Scarring: No Mottled: No Pallor: No Moisture Rubor: No No Abnormalities Noted: No Dry / Scaly: No Maceration: No Treatment Notes Wound #1  (Sacrum) Cleanser Soap and Water Discharge Instruction: May shower and wash wound with dial antibacterial soap and water prior to dressing change. Wound Cleanser Discharge Instruction: Cleanse the wound with wound cleanser prior to applying [name] clean dressing using gauze sponges, not tissue or cotton balls. Peri-Wound Care Skin Prep Discharge Instruction: Use skin prep as directed Topical Antibiotic ointment Discharge Instruction: apply [name] small amount ointment. Primary Dressing Secondary Dressing Woven Gauze Sponge, Non-Sterile 4x4 in [place], Alter [name] ([id number]) [phone].pdf Page 8 of 8 Discharge Instruction: Apply over primary dressing as directed. Zetuvit Plus Silicone Border Dressing 4x4 (in/in) Discharge Instruction: Apply silicone border over primary dressing as directed. Secured With Compression Wrap Compression Stockings Facilities manager) Signed: 01/18/2023 5:03:18 PM By: Shawn Stall RN, BSN Entered By: Shawn Stall on 01/18/2023 09:12:18 -------------------------------------------------------------------------------- Vitals Details Patient Name: Date of Service: Joe Dills, Joe [name]. 01/18/2023 8:45 [name] Medical Record Number: [id number] Patient Account Number: [id number] Date of Birth/Sex: Treating RN: 1956-01-26 (67 y.o. [name], [name] Primary Care Gelsey Amyx: [name] Other Clinician: Referring Paxton Binns: Treating Huber Mathers/Extender: [name] in Treatment: 20 Vital Signs Time Taken: 08:28 Temperature (F): 98.7 Height (in): 67 Pulse (bpm): 61 Weight (lbs): 160 Respiratory Rate (breaths/min): 20 Body Mass Index (BMI): 25.1 Blood Pressure (mmHg): 113/71 Reference Range: 80 - 120 mg / dl Electronic Signature(s) Signed: 01/18/2023 5:03:18 PM By: Shawn Stall RN, BSN Entered By: Shawn Stall on 01/18/2023 08:28:40

## 2023-01-20 ENCOUNTER — Encounter (HOSPITAL_BASED_OUTPATIENT_CLINIC_OR_DEPARTMENT_OTHER): Payer: Medicare Other | Admitting: General Surgery

## 2023-01-20 DIAGNOSIS — M4628 Osteomyelitis of vertebra, sacral and sacrococcygeal region: Secondary | ICD-10-CM | POA: Diagnosis not present

## 2023-01-20 NOTE — Progress Notes (Addendum)
Joe [name] [id number]) [id number] Page 1 of 2 Visit Report for 01/20/2023 HBO Details Patient Name: Date of Service: Joe [name], [place] [name]. 01/20/2023 8:00 [name] Medical Record Number: [id number] Patient Account Number: [id number] Date of Birth/Sex: Treating RN: 1955-11-30 (67 y.o. 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 HBO Treatment Details Treatment Number: 1 Patient Type: Outpatient Chamber Type: Monoplace Chamber Serial #: [id number] Treatment Protocol: 2.0 ATA with 90 minutes oxygen, and no air breaks Treatment Details Compression Rate Down: 1.0 psi / minute De-Compression Rate Up: 1.5 psi / minute Air breaks and breathing Decompress Decompress Compress Tx Pressure Begins Reached periods Begins Ends (leave unused spaces blank) Chamber Pressure (ATA 1 2 ------2 1 ) Clock Time (24 hr) 08:41 08:56 - - - - - - 10:26 10:36 Treatment Length: 115 (minutes) Treatment Segments: 4 Vital Signs Capillary Blood Glucose Reference Range: 80 - 120 mg / dl HBO Diabetic Blood Glucose Intervention Range: <131 mg/dl or >893 mg/dl Type: Time Vitals Blood Pulse: Respiratory Temperature: Capillary Blood Glucose Pulse Action Taken: Pressure: Rate: Glucose (mg/dl): Meter #: Oximetry (%) Taken: Pre 08:05 117/74 55 18 98.1 patient asymptomatic for bradycardia Post 10:39 117/74 48 18 98 patient asymptomatic for bradycardia Treatment Response Treatment Toleration: Well Treatment Completion Status: Treatment Completed without Adverse Event Treatment Notes Patient and mother arrived for first  treatment today. Patient prepared for treatment. Vital signs taken bedside. Pulse rate < 55. Patient had no symptoms associated with bradycardia. After performing [name] safety check, patient was placed in the chamber which was compressed with 100% oxygen at [name] rate of 1 psi/min. Patient treated today per verbal order from Dr. Lady [name] at 2 ATA x 90 min with no air breaks. [name] tolerated the treatment and subsequent decompression of the chamber at the rate of 1 psi/min. At approximately 8 psig, travel rate was increased to 1.5 psi/min as patient denied any issues with ear equalization. Post-treatment vital signs were normal with the exception of heart rate of 48 bpm. Patient denied any symptoms of bradycardia saying that he felt well. He did state that his ears were just [name] little sore. Dr. Lady [name] was made aware and examined patient. Physician HBO Attestation: I certify that I supervised this HBO treatment in accordance with Medicare guidelines. [name] trained emergency response team is readily available per Yes hospital policies and procedures. Continue HBOT as ordered. Yes Electronic Signature(s) Signed: 01/20/2023 6:25:01 PM By: Joe [name] CHT EMT BS , , Signed: 01/21/2023 8:06:48 AM By: Duanne Guess MD FACS Previous Signature: 01/20/2023 4:32:54 PM Version By: Duanne Guess MD FACS Previous Signature: 01/20/2023 3:40:25 PM Version By: Joe [name] CHT EMT BS , , Entered By: Joe [name] on 01/20/2023 18:25:01 Joe [name] ([id number]) [id number] Page 2 of 2 -------------------------------------------------------------------------------- HBO Safety Checklist Details Patient Name: Date of Service: Joe [name], [place] [name]. 01/20/2023 8:00 [name] Medical Record Number: [id number] Patient Account Number: [id number] Date of Birth/Sex: Treating RN: [birth year] (67 y.o. Joe [name] Primary Care Joe [name]: Joe [name] Other Clinician: Haywood [name] Referring Joe [name]: Treating Joe [name]/Extender: Joe [name] in Treatment: 20 HBO Safety Checklist Items Safety Checklist Consent Form Signed Patient voided / foley secured and emptied When  did you last eato Breakfast Last dose of injectable or oral agent n/[name] Ostomy pouch emptied and vented if applicable NA All implantable devices assessed, documented and approved NA Intravenous access site secured and place NA Valuables secured Linens and cotton and cotton/polyester blend (less than 51% polyester) Personal oil-based products / skin lotions / body lotions removed Wigs or hairpieces removed NA Smoking or tobacco materials removed NA Books / newspapers / magazines / loose paper removed Cologne, aftershave, perfume and deodorant removed Jewelry removed (may wrap wedding band) Make-up removed NA Hair care products removed Battery operated devices (external) removed Heating patches and chemical warmers removed Titanium eyewear removed Nail polish cured greater than 10 hours NA Casting material cured greater than 10 hours NA Hearing aids removed NA Loose dentures or partials removed NA Prosthetics have been removed NA Patient demonstrates correct use of air break device (if applicable) Patient concerns have been addressed Patient grounding bracelet on and cord attached to chamber Specifics for Inpatients (complete in addition to above) Medication sheet sent with patient NA Intravenous medications needed or due during therapy sent with patient NA Drainage tubes (e.g. nasogastric tube or chest tube secured and vented) NA Endotracheal or Tracheotomy tube secured NA Cuff deflated of air and inflated with saline NA Airway suctioned NA Notes Paper version used prior to treatment start. Electronic Signature(s) Signed: 01/20/2023 3:40:25 PM By: Joe [name] CHT EMT BS , , Entered By: Joe [name] on 01/20/2023 15:18:44

## 2023-01-20 NOTE — Progress Notes (Addendum)
Wease, Chadric A ([id number]) [id number] Page 1 of 2 Visit Report for 01/20/2023 Arrival Information Details Patient Name: Date of Service: Joe [name], [place] RK A. 01/20/2023 8:00 A M Medical Record Number: [id number] Patient Account Number: [id number] Date of Birth/Sex: Treating RN: [birth year] (67 y.o. Joe [name] Primary Care Joe [name]: Joe [name] Other Clinician: Haywood [name] Referring Joe [name]: Treating Joe [name]/Extender: Joe [name] in Treatment: 20 Visit Information History Since Last Visit All ordered tests and consults were completed: Yes Patient Arrived: Ambulatory Added or deleted any medications: No Arrival Time: 07:30 Any new allergies or adverse reactions: No Accompanied By: mother Had a fall or experienced change in No Transfer Assistance: None activities of daily living that may affect Patient Identification Verified: Yes risk of falls: Secondary Verification Process Completed: Yes Signs or symptoms of abuse/neglect since last visito No Patient Requires Transmission-Based Precautions: No Hospitalized since last visit: No Patient Has Alerts: Yes Implantable device outside of the clinic excluding No Patient Alerts: Patient on Blood Thinner cellular tissue based products placed in the center PICC LINE RIGHT ARM since last visit: NO BP'S RIGHT ARM Pain Present Now: No Electronic Signature(s) Signed: 01/20/2023 3:40:25 PM By: Joe [name] Joe [name] , , Entered By: Joe [name] on 01/20/2023 15:18:29 -------------------------------------------------------------------------------- Encounter Discharge Information Details Patient Name: Date of Service: Joe Dills, Joe RK A. 01/20/2023 8:00 A M Medical Record Number: [id number] Patient Account Number: [id number] Date of Birth/Sex: Treating RN: [birth year] (67 y.o. Joe [name] Primary Care Joe [name]: Joe [name] Other Clinician:  Haywood [name] Referring Joe [name]: Treating Joe [name]/Extender: Joe [name] in Treatment: 20 Encounter Discharge Information Items Discharge Condition: Stable Ambulatory Status: Ambulatory Discharge Destination: Home Transportation: Private Auto Accompanied By: mother Schedule Follow-up Appointment: No Clinical Summary of Care: Electronic Signature(s) Signed: 01/20/2023 3:40:25 PM By: Joe [name] Joe [name] , , Entered By: Joe [name] on 01/20/2023 15:25:02 -------------------------------------------------------------------------------- Vitals Details Patient Name: Date of Service: Joe Dills, Joe RK A. 01/20/2023 8:00 A M Medical Record Number: [id number] Patient Account Number: [id number] Date of Birth/Sex: Treating RN: 09/14/1956 (67 y.o. Joe [name] Primary Care Joe [name]: Joe [name] Other Clinician: Haywood [name] Referring Joe [name]: Treating Joe [name]/Extender: Joe [name] in Treatment: 20 Vital Signs Time Taken: 08:05 Temperature (F): 98.1 [name], [name] ([id number]) [id number] Page 2 of 2 Height (in): 67 Pulse (bpm): 55 Weight (lbs): 160 Respiratory Rate (breaths/min): 18 Body Mass Index (BMI): 25.1 Blood Pressure (mmHg): 117/74 Reference Range: 80 - 120 mg / dl Electronic Signature(s) Signed: 01/20/2023 3:40:25 PM By: Joe [name] Joe [name] , , Entered By: Joe [name] on 01/20/2023 15:18:36

## 2023-01-20 NOTE — Progress Notes (Signed)
[name], [name] ([id number]) [id number] Page 1 of 1 Visit Report for 01/20/2023 SuperBill Details Patient Name: Date of Service: Joe [name], [place] RK A. 01/20/2023 Medical Record Number: [id number] Patient Account Number: [id number] Date of Birth/Sex: Treating RN: [birth year] (67 y.o. [name] Primary Care Provider: [name] Other Clinician: [name] Referring Provider: Treating Provider/Extender: Lou Miner in Treatment: 20 Diagnosis Coding ICD-10 Codes Code Description [phone] Pressure ulcer of sacral region, stage 4 I50.42 Chronic combined systolic (congestive) and diastolic (congestive) heart failure Z95.1 Presence of aortocoronary bypass graft M46.28 Osteomyelitis of vertebra, sacral and sacrococcygeal region Facility Procedures CPT4 Code Description Modifier Quantity [id number] G0277-(Facility Use Only) HBOT full body chamber, , 4 ICD-10 Diagnosis Description M46.28 Osteomyelitis of vertebra, sacral and sacrococcygeal region L89.154 Pressure ulcer of sacral region, stage 4 I50.42 Chronic combined systolic (congestive) and diastolic (congestive) heart failure Z95.1 Presence of aortocoronary bypass graft Physician Procedures Quantity CPT4 Code Description Modifier [phone] - WC PHYS HYPERBARIC OXYGEN THERAPY 1 ICD-10 Diagnosis Description M46.28 Osteomyelitis of vertebra, sacral and sacrococcygeal region L89.154 Pressure ulcer of sacral region, stage 4 I50.42 Chronic combined systolic (congestive) and diastolic (congestive) heart failure Z95.1 Presence of aortocoronary bypass graft Electronic Signature(s) Signed: 01/20/2023 3:40:25 PM By: [name] CHT EMT BS , , Signed: 01/20/2023 4:32:21 PM By: Duanne Guess MD FACS Entered By: [name] on 01/20/2023 15:24:25

## 2023-01-21 ENCOUNTER — Encounter (HOSPITAL_BASED_OUTPATIENT_CLINIC_OR_DEPARTMENT_OTHER): Payer: Medicare Other | Admitting: Internal Medicine

## 2023-01-21 DIAGNOSIS — L89154 Pressure ulcer of sacral region, stage 4: Secondary | ICD-10-CM | POA: Diagnosis not present

## 2023-01-21 DIAGNOSIS — I5042 Chronic combined systolic (congestive) and diastolic (congestive) heart failure: Secondary | ICD-10-CM

## 2023-01-21 DIAGNOSIS — Z951 Presence of aortocoronary bypass graft: Secondary | ICD-10-CM

## 2023-01-21 DIAGNOSIS — M4628 Osteomyelitis of vertebra, sacral and sacrococcygeal region: Secondary | ICD-10-CM

## 2023-01-21 NOTE — Progress Notes (Addendum)
Alioto, Bandy A [phone]([id number]) [id number] Page 1 of 2 Visit Report for 01/21/2023 HBO Details Patient Name: Date of Service: Joe DillsREFINA [name], [place] A. 01/21/2023 8:00 A M Medical Record Number: [id number] Patient Account Number: [id number] Date of Birth/Sex: Treating RN: 1956-02-12 (67 y.o. 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 HBO Treatment Details Treatment Number: 2 Patient Type: Outpatient Chamber Type: Monoplace Chamber Serial #: [id number] Treatment Protocol: 2.0 ATA with 90 minutes oxygen, and no air breaks Treatment Details Compression Rate Down: 1.0 psi / minute De-Compression Rate Up: 1.5 psi / minute Air breaks and breathing Decompress Decompress Compress Tx Pressure Begins Reached periods Begins Ends (leave unused spaces blank) Chamber Pressure (ATA 1 2 ------2 1 ) Clock Time (24 hr) 08:12 08:25 - - - - - - 09:39 09:49 Treatment Length: 97 (minutes) Treatment Segments: 3 Vital Signs Capillary Blood Glucose Reference Range: 80 - 120 mg / dl HBO Diabetic Blood Glucose Intervention Range: <131 mg/dl or >324>249 mg/dl Type: Time Vitals Blood Pulse: Respiratory Temperature: Capillary Blood Glucose Pulse Action Taken: Pressure: Rate: Glucose (mg/dl): Meter #: Oximetry (%) Taken: Pre 07:53 104/67 60 18 97.9 (manual pulse reading) Post 09:57 99/65 59 18 98 patient asymptomatic for hypotension. Treatment Response Treatment Toleration: Fair Treatment Completion Status: Treatment Completed without Adverse Event Treatment Notes Patient and mother arrived and prepared for  treatment. Vital signs were within normal range. Manual pulse rate was 60 bpm. After performing safety check, patient was placed in the chamber which was compressed with 100% oxygen at a rate of 1 psi/min confirming normal ear equalization throughout travel. Patient treated today by verbal order from Dr. Mikey BussingHoffman for 2.0 ATA x 90 minutes with no air breaks. Patient tolerated treatment until 0939 at which time patient stated he needed to use the restroom. Decompression of the chamber proceeded at approximately 1.5 psi/min. Patient denied any issues with ear equalization and/or pain. He was stable upon discharge. Physician HBO Attestation: I certify that I supervised this HBO treatment in accordance with Medicare guidelines. A trained emergency response team is readily available per Yes hospital policies and procedures. Continue HBOT as ordered. Yes Electronic Signature(s) Signed: 01/22/2023 9:58:26 PM By: Haywood PaoScammell, Michael CHT EMT BS , , Signed: 01/25/2023 11:42:23 AM By: Geralyn CorwinHoffman, Jessica DO Previous Signature: 01/21/2023 3:29:47 PM Version By: Geralyn CorwinHoffman, Jessica DO Previous Signature: 01/21/2023 12:30:30 PM Version By: Haywood PaoScammell, Michael CHT EMT BS , , Previous Signature: 01/21/2023 11:41:58 AM Version By: Haywood PaoScammell, Michael CHT EMT BS , , Entered By: Haywood PaoScammell, Michael on 01/22/2023 21:58:26 Cedrone, Kunal A ([id number]) [id number] Page 2 of 2 -------------------------------------------------------------------------------- HBO Safety Checklist Details Patient Name: Date of Service: Joe DillsREFINA [name], [place]. 01/21/2023 8:00 A M Medical Record Number: [id number] Patient Account Number: [id number] Date of Birth/Sex: Treating RN: 1956-02-12 (67 y.o. Joe [name]) Herrington, Taylor Primary Care Navarro Nine: [name], [name] Other Clinician: Haywood PaoScammell, Michael Referring Lashawnna Lambrecht: Treating Johsua Shevlin/Extender: Loetta RoughHoffman, [name], [name] Weeks in Treatment: 21 HBO Safety Checklist  Items Safety Checklist Consent Form Signed Patient voided / foley secured and emptied When did you last eato Breakfast Last dose of injectable or oral agent n/a Ostomy pouch emptied and vented if applicable NA All implantable devices assessed, documented and approved NA Intravenous access site  secured and place NA Valuables secured Linens and cotton and cotton/polyester blend (less than 51% polyester) Personal oil-based products / skin lotions / body lotions removed Wigs or hairpieces removed NA Smoking or tobacco materials removed NA Books / newspapers / magazines / loose paper removed Cologne, aftershave, perfume and deodorant removed Jewelry removed (may wrap wedding band) Make-up removed Hair care products removed Battery operated devices (external) removed Heating patches and chemical warmers removed NA Titanium eyewear removed NA Nail polish cured greater than 10 hours NA Casting material cured greater than 10 hours NA Hearing aids removed NA Loose dentures or partials removed NA Prosthetics have been removed NA Patient demonstrates correct use of air break device (if applicable) Patient concerns have been addressed Patient grounding bracelet on and cord attached to chamber Specifics for Inpatients (complete in addition to above) Medication sheet sent with patient NA Intravenous medications needed or due during therapy sent with patient NA Drainage tubes (e.g. nasogastric tube or chest tube secured and vented) NA Endotracheal or Tracheotomy tube secured NA Cuff deflated of air and inflated with saline NA Airway suctioned NA Notes Paper version used prior to treatment start. Electronic Signature(s) Signed: 01/21/2023 11:37:47 AM By: [name] CHT EMT BS , , Entered By: [name] on 01/21/2023 11:37:47

## 2023-01-21 NOTE — Progress Notes (Signed)
Servais, Zach A ([id number]) [id number] Page 1 of 1 Visit Report for 01/21/2023 SuperBill Details Patient Name: Date of Service: Joe [name], [place] RK A. 01/21/2023 Medical Record Number: [id number] Patient Account Number: [id number] Date of Birth/Sex: Treating RN: [birth year] (67 y.o. [name] Primary Care Provider: [name] Other Clinician: [name] Referring Provider: Treating Provider/Extender: [name] in Treatment: 21 Diagnosis Coding ICD-10 Codes Code Description [phone] Pressure ulcer of sacral region, stage 4 I50.42 Chronic combined systolic (congestive) and diastolic (congestive) heart failure Z95.1 Presence of aortocoronary bypass graft M46.28 Osteomyelitis of vertebra, sacral and sacrococcygeal region Facility Procedures CPT4 Code Description Modifier Quantity [id number] G0277-(Facility Use Only) HBOT full body chamber, , 3 ICD-10 Diagnosis Description M46.28 Osteomyelitis of vertebra, sacral and sacrococcygeal region L89.154 Pressure ulcer of sacral region, stage 4 I50.42 Chronic combined systolic (congestive) and diastolic (congestive) heart failure Z95.1 Presence of aortocoronary bypass graft Physician Procedures Quantity CPT4 Code Description Modifier [id number] 99183 - WC PHYS HYPERBARIC OXYGEN THERAPY 1 ICD-10 Diagnosis Description M46.28 Osteomyelitis of vertebra, sacral and sacrococcygeal region L89.154 Pressure ulcer of sacral region, stage 4 I50.42 Chronic combined systolic (congestive) and diastolic (congestive) heart failure Z95.1 Presence of aortocoronary bypass graft Electronic Signature(s) Signed: 01/21/2023 12:22:48 PM By: [name] CHT EMT BS , , Signed: 01/21/2023 3:29:47 PM By: Geralyn Corwin DO Entered By: [name] on 01/21/2023 12:22:48

## 2023-01-21 NOTE — Progress Notes (Addendum)
Joe [name], Joe [name] ([id number]) [id number] Page 1 of 2 Visit Report for 01/21/2023 Arrival Information Details Patient Name: Date of Service: Joe [name], Joe [name]. 01/21/2023 8:00 [name] Medical Record Number: [id number] Patient Account Number: [id number] Date of Birth/Sex: Treating RN: 02-18-1956 (67 y.o. Joe [name] Primary Care Joe [name]: Joe [name] Other Clinician: Haywood [name] Referring Joe [name]: Treating Joe [name]/Extender: Joe [name] in Treatment: 21 Visit Information History Since Last Visit All ordered tests and consults were completed: Yes Patient Arrived: Ambulatory Added or deleted any medications: No Arrival Time: 07:32 Any new allergies or adverse reactions: No Accompanied By: mother Had [name] fall or experienced change in No Transfer Assistance: None activities of daily living that may affect Patient Identification Verified: Yes risk of falls: Secondary Verification Process Completed: Yes Signs or symptoms of abuse/neglect since last visito No Patient Requires Transmission-Based Precautions: No Hospitalized since last visit: No Patient Has Alerts: Yes Implantable device outside of the clinic excluding No Patient Alerts: Patient on Blood Thinner cellular tissue based products placed in the center PICC LINE RIGHT ARM since last visit: NO BP'S RIGHT ARM Pain Present Now: No Electronic Signature(s) Signed: 01/21/2023 11:36:23 AM By: Joe [name] CHT EMT BS , , Entered By: Joe [name] on 01/21/2023 11:36:22 -------------------------------------------------------------------------------- Encounter Discharge Information Details Patient Name: Date of Service: Joe Dills, [place] [name]. 01/21/2023 8:00 [name] Medical Record Number: [id number] Patient Account Number: [id number] Date of Birth/Sex: Treating RN: 10-26-1955 (67 y.o. Joe [name] Primary Care Joe [name]: Joe [name] Other  Clinician: Haywood [name] Referring Joe [name]: Treating Joe [name]/Extender: Joe [name] in Treatment: 21 Encounter Discharge Information Items Discharge Condition: Stable Ambulatory Status: Ambulatory Discharge Destination: Home Transportation: Private Auto Accompanied By: mother Schedule Follow-up Appointment: No Clinical Summary of Care: Electronic Signature(s) Signed: 01/21/2023 12:23:13 PM By: Joe [name] CHT EMT BS , , Entered By: Joe [name] on 01/21/2023 12:23:12 -------------------------------------------------------------------------------- Vitals Details Patient Name: Date of Service: Joe Dills, [place] [name]. 01/21/2023 8:00 [name] Medical Record Number: [id number] Patient Account Number: [id number] Date of Birth/Sex: Treating RN: 1956-10-02 (67 y.o. Joe [name] Primary Care Joe [name]: Joe [name] Other Clinician: Haywood [name] Referring [name]: Treating Joe [name]/Extender: Joe [name] in Treatment: 21 Vital Signs Time Taken: 07:53 Temperature (F): 97.9 Wass, [name] ([id number]) [id number] Page 2 of 2 Height (in): 67 Pulse (bpm): 60 Weight (lbs): 160 Respiratory Rate (breaths/min): 18 Body Mass Index (BMI): 25.1 Blood Pressure (mmHg): 104/67 Reference Range: 80 - 120 mg / dl Electronic Signature(s) Signed: 01/21/2023 11:36:46 AM By: Joe [name] CHT EMT BS , , Entered By: Joe [name] on 01/21/2023 11:36:46

## 2023-01-22 ENCOUNTER — Encounter (HOSPITAL_BASED_OUTPATIENT_CLINIC_OR_DEPARTMENT_OTHER): Payer: Medicare Other | Admitting: Internal Medicine

## 2023-01-22 DIAGNOSIS — M4628 Osteomyelitis of vertebra, sacral and sacrococcygeal region: Secondary | ICD-10-CM

## 2023-01-22 DIAGNOSIS — I5042 Chronic combined systolic (congestive) and diastolic (congestive) heart failure: Secondary | ICD-10-CM | POA: Diagnosis not present

## 2023-01-22 DIAGNOSIS — L89154 Pressure ulcer of sacral region, stage 4: Secondary | ICD-10-CM | POA: Diagnosis not present

## 2023-01-22 DIAGNOSIS — Z951 Presence of aortocoronary bypass graft: Secondary | ICD-10-CM | POA: Diagnosis not present

## 2023-01-22 NOTE — Progress Notes (Signed)
Joe [name], [name] [id number]) [id number] Page 1 of 2 Visit Report for 01/22/2023 HBO Details Patient Name: Date of Service: Joe [name], [place] [name]. 01/22/2023 8:00 [name] Medical Record Number: [id number] Patient Account Number: [id number] Date of Birth/Sex: Treating RN: Jul 25, 1956 (67 y.o. 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 HBO Treatment Details Treatment Number: 3 Patient Type: Outpatient Chamber Type: Monoplace Chamber Serial #: [id number] Treatment Protocol: 2.0 ATA with 90 minutes oxygen, and no air breaks Treatment Details Compression Rate Down: 1.0 psi / minute De-Compression Rate Up: 1.0 psi / minute Air breaks and breathing Decompress Decompress Compress Tx Pressure Begins Reached periods Begins Ends (leave unused spaces blank) Chamber Pressure (ATA 1 2 ------2 1 ) Clock Time (24 hr) 07:54 08:05 - - - - - - 09:35 09:50 Treatment Length: 116 (minutes) Treatment Segments: 4 Vital Signs Capillary Blood Glucose Reference Range: 80 - 120 mg / dl HBO Diabetic Blood Glucose Intervention Range: <131 mg/dl or >462 mg/dl Type: Time Vitals Blood Pulse: Respiratory Temperature: Capillary Blood Glucose Pulse Action Taken: Pressure: Rate: Glucose (mg/dl): Meter #: Oximetry (%) Taken: Pre 07:41 122/73 47 18 97.3 informed physician of HR Post 09:53 128/87 47 18 98.2 patient asymptomatic for bradycardia Treatment Response Treatment Toleration: Well Treatment Completion Status: Treatment Completed without Adverse Event Treatment Notes Patient arrived and prepared for treatment. Vital  signs were within normal range with the exception of pulse rate of 47 bpm (machine) 42 bpm (7 beats/10 s). Informed Dr. Mikey Bussing via telephone of patient's pulse rate and denial of any symptoms related to bradycardia. After performing safety check, patient was placed in the chamber which was compressed with 100% oxygen at [name] rate of 1 psi/min confirming normal ear equalization throughout travel. Joe [name] was treated today per 01/21/2023 verbal order from Dr. Mikey Bussing at 2.0 ATA x 90 minutes with no Air Break. (Order updated in the system today). Patient tolerated treatment and the subsequent decompression of the chamber at 1 psi/min. Joe [name] denied any issues with ear equalization and/or pain. He was stable upon discharge. Physician HBO Attestation: I certify that I supervised this HBO treatment in accordance with Medicare guidelines. [name] trained emergency response team is readily available per Yes hospital policies and procedures. Continue HBOT as ordered. Yes Electronic Signature(s) Signed: 01/25/2023 11:42:23 AM By: Geralyn Corwin DO Previous Signature: 01/22/2023 10:00:27 PM Version By: [name] CHT EMT BS , , Previous Signature: 01/22/2023 11:00:28 AM Version By: [name] CHT EMT BS , , Previous Signature: 01/22/2023 10:59:50 AM Version By: [name] CHT EMT BS , , Previous Signature: 01/22/2023 9:10:22 AM Version By: [name] CHT EMT BS , , Entered By: Geralyn Corwin on 01/25/2023 09:09:58 Joe [name], Joe [name] ([id number]) [id number] Page 2 of 2 -------------------------------------------------------------------------------- HBO Safety Checklist Details Patient Name: Date of Service: Joe [name], [place] [name]. 01/22/2023 8:00 [name] Medical Record Number: [id number] Patient Account Number: [id number] Date of Birth/Sex: Treating RN: 04/14/1956 (67 y.o. [name] Primary Care Lashante Fryberger: [name] Other Clinician: [name] Referring Karry Causer: Treating Wm Sahagun/Extender: [name] in Treatment: 21 HBO Safety Checklist Items Safety Checklist Consent Form Signed Patient voided / foley secured and emptied When did you last  eato Breakfast Last dose of injectable or oral agent n/[name] Ostomy pouch emptied and vented if applicable NA All implantable devices assessed, documented and approved NA Intravenous access site secured and place NA Valuables secured Linens and cotton and cotton/polyester blend (less than 51% polyester) Personal oil-based products / skin lotions / body lotions removed Wigs or hairpieces removed NA Smoking or tobacco materials removed NA Books / newspapers / magazines / loose paper removed Cologne, aftershave, perfume and deodorant removed Jewelry removed (may wrap wedding band) Make-up removed NA Hair care products removed Battery operated devices (external) removed Heating patches and chemical warmers removed Titanium eyewear removed Nail polish cured greater than 10 hours NA Casting material cured greater than 10 hours NA Hearing aids removed NA Loose dentures or partials removed NA Prosthetics have been removed NA Patient demonstrates correct use of air break device (if applicable) Patient concerns have been addressed Patient grounding bracelet on and cord attached to chamber Specifics for Inpatients (complete in addition to above) Medication sheet sent with patient NA Intravenous medications needed or due during therapy sent with patient NA Drainage tubes (e.g. nasogastric tube or chest tube secured and vented) NA Endotracheal or Tracheotomy tube secured NA Cuff deflated of air and inflated with saline NA Airway suctioned NA Notes Paper version used prior to treatment start. Electronic Signature(s) Signed: 01/22/2023 9:05:10 AM By: [name] CHT EMT BS , , Entered By: [name] on 01/22/2023 09:05:10

## 2023-01-22 NOTE — Progress Notes (Addendum)
Joe, [name] ([id number]) [id number] Page 1 of 2 Visit Report for 01/22/2023 Arrival Information Details Patient Name: Date of Service: Joe [name], [place] Joe [name]. 01/22/2023 8:00 [name] Medical Record Number: [id number] Patient Account Number: [id number] Date of Birth/Sex: Treating RN: 02/09/1956 (67 y.o. Joe [name] Primary Care Joe [name]: Joe [name] Other Clinician: Haywood [name] Referring Joe [name]: Treating Joe [name]/Extender: Joe [name] in Treatment: 21 Visit Information History Since Last Visit All ordered tests and consults were completed: Yes Patient Arrived: Ambulatory Added or deleted any medications: No Arrival Time: 07:31 Any new allergies or adverse reactions: No Accompanied By: mother Had [name] fall or experienced change in No Transfer Assistance: None activities of daily living that may affect Patient Identification Verified: Yes risk of falls: Secondary Verification Process Completed: Yes Signs or symptoms of abuse/neglect since last visito No Patient Requires Transmission-Based Precautions: No Hospitalized since last visit: No Patient Has Alerts: Yes Implantable device outside of the clinic excluding No Patient Alerts: Patient on Blood Thinner cellular tissue based products placed in the center since last visit: Pain Present Now: No Electronic Signature(s) Signed: 01/22/2023 9:02:53 AM By: Joe [name] CHT EMT BS , , Entered By: Joe [name] on 01/22/2023 09:02:53 -------------------------------------------------------------------------------- Encounter Discharge Information Details Patient Name: Date of Service: Joe Dills, [place] Joe [name]. 01/22/2023 8:00 [name] Medical Record Number: [id number] Patient Account Number: [id number] Date of Birth/Sex: Treating RN: September 05, 1956 (67 y.o. Joe [name] Primary Care Kaeli Nichelson: Joe [name] Other Clinician: Haywood [name] Referring  Joe [name]: Treating Joe [name]/Extender: Joe [name] in Treatment: 21 Encounter Discharge Information Items Discharge Condition: Stable Ambulatory Status: Ambulatory Discharge Destination: Home Transportation: Private Auto Accompanied By: mother Schedule Follow-up Appointment: No Clinical Summary of Care: Electronic Signature(s) Signed: 01/22/2023 11:01:23 AM By: Joe [name] CHT EMT BS , , Entered By: Joe [name] on 01/22/2023 11:01:23 -------------------------------------------------------------------------------- Vitals Details Patient Name: Date of Service: Joe Dills, [place] Joe [name]. 01/22/2023 8:00 [name] Medical Record Number: [id number] Patient Account Number: [id number] Date of Birth/Sex: Treating RN: [birth year] (67 y.o. Joe [name] Primary Care Vu Liebman: Joe [name] Other Clinician: Haywood [name] Referring Joe [name]: Treating Joe [name]/Extender: Joe [name] Weeks in Treatment: 21 Vital Signs Time Taken: 07:41 Temperature (F): 97.3 Dockter, Joe [name] ([id number]) [phone].pdf Page 2 of 2 Height (in): 67 Pulse (bpm): 47 Weight (lbs): 160 Respiratory Rate (breaths/min): 18 Body Mass Index (BMI): 25.1 Blood Pressure (mmHg): 122/73 Reference Range: 80 - 120 mg / dl Electronic Signature(s) Signed: 01/22/2023 9:04:03 AM By: Joe [name] CHT EMT BS , , Entered By: Joe [name] on 01/22/2023 09:04:03

## 2023-01-25 ENCOUNTER — Encounter (HOSPITAL_BASED_OUTPATIENT_CLINIC_OR_DEPARTMENT_OTHER): Payer: Medicare Other | Admitting: Internal Medicine

## 2023-01-25 ENCOUNTER — Other Ambulatory Visit (HOSPITAL_BASED_OUTPATIENT_CLINIC_OR_DEPARTMENT_OTHER): Payer: Self-pay

## 2023-01-25 DIAGNOSIS — L89154 Pressure ulcer of sacral region, stage 4: Secondary | ICD-10-CM

## 2023-01-25 DIAGNOSIS — I5042 Chronic combined systolic (congestive) and diastolic (congestive) heart failure: Secondary | ICD-10-CM | POA: Diagnosis not present

## 2023-01-25 DIAGNOSIS — M4628 Osteomyelitis of vertebra, sacral and sacrococcygeal region: Secondary | ICD-10-CM | POA: Diagnosis not present

## 2023-01-25 DIAGNOSIS — Z951 Presence of aortocoronary bypass graft: Secondary | ICD-10-CM | POA: Diagnosis not present

## 2023-01-25 NOTE — Progress Notes (Addendum)
Rendall, Skyelar A ([id number]) [id number] Page 1 of 2 Visit Report for 01/25/2023 Arrival Information Details Patient Name: Date of Service: Theo Dills, [place] RK A. 01/25/2023 8:00 A M Medical Record Number: [id number] Patient Account Number: [id number] Date of Birth/Sex: Treating RN: 1956/08/14 (67 y.o. [name] Primary Care Tajay Muzzy: [name] Other Clinician: [name] Referring Winona Sison: Treating Chanah Tidmore/Extender: [name] in Treatment: 21 Visit Information History Since Last Visit All ordered tests and consults were completed: Yes Patient Arrived: Ambulatory Added or deleted any medications: No Arrival Time: 07:33 Any new allergies or adverse reactions: No Accompanied By: mother Had a fall or experienced change in No Transfer Assistance: None activities of daily living that may affect Patient Identification Verified: Yes risk of falls: Secondary Verification Process Completed: Yes Signs or symptoms of abuse/neglect since last visito No Patient Requires Transmission-Based Precautions: No Hospitalized since last visit: No Patient Has Alerts: Yes Implantable device outside of the clinic excluding No Patient Alerts: Patient on Blood Thinner cellular tissue based products placed in the center since last visit: Pain Present Now: No Electronic Signature(s) Signed: 01/25/2023 8:46:38 AM By: [name] CHT EMT BS , , Entered By: [name] on 01/25/2023 08:46:38 -------------------------------------------------------------------------------- Encounter Discharge Information Details Patient Name: Date of Service: Theo Dills, [name] 01/25/2023 8:00 A M Medical Record Number: [id number] Patient Account Number: [id number] Date of Birth/Sex: Treating RN: 23-Jul-1956 (67 y.o. [name] Primary Care Malesha Suliman: [name] Other Clinician: [name] Referring  Zev Blue: Treating Kelsy Polack/Extender: [name] in Treatment: 21 Encounter Discharge Information Items Discharge Condition: Stable Ambulatory Status: Ambulatory Discharge Destination: Home Transportation: Private Auto Accompanied By: mother Schedule Follow-up Appointment: No Clinical Summary of Care: Electronic Signature(s) Signed: 01/25/2023 11:27:45 AM By: [name] CHT EMT BS , , Entered By: [name] on 01/25/2023 11:27:45 -------------------------------------------------------------------------------- Vitals Details Patient Name: Date of Service: Theo Dills, [name] 01/25/2023 8:00 A M Medical Record Number: [id number] Patient Account Number: [id number] Date of Birth/Sex: Treating RN: 04/01/1956 (67 y.o. [name] Primary Care Mishayla Sliwinski: [name] Other Clinician: [name] Referring Coltan Spinello: Treating Cristella Stiver/Extender: [name] in Treatment: 21 Vital Signs Time Taken: 07:48 Temperature (F): 98.1 Cotterill, Kekoa A ([id number]) [phone].pdf Page 2 of 2 Height (in): 67 Pulse (bpm): 56 Weight (lbs): 160 Respiratory Rate (breaths/min): 18 Body Mass Index (BMI): 25.1 Blood Pressure (mmHg): 110/67 Reference Range: 80 - 120 mg / dl Electronic Signature(s) Signed: 01/25/2023 8:47:00 AM By: [name] CHT EMT BS , , Entered By: [name] on 01/25/2023 08:47:00

## 2023-01-25 NOTE — Progress Notes (Signed)
[name], [name] ([id number]) [id number] Page 1 of 1 Visit Report for 01/22/2023 SuperBill Details Patient Name: Date of Service: Joe [name], [place] RK A. 01/22/2023 Medical Record Number: [id number] Patient Account Number: [id number] Date of Birth/Sex: Treating RN: 1956-05-01 (67 y.o. [name] Primary Care Provider: [name] Other Clinician: [name] Referring Provider: Treating Provider/Extender: [name] in Treatment: 21 Diagnosis Coding ICD-10 Codes Code Description [phone] Pressure ulcer of sacral region, stage 4 I50.42 Chronic combined systolic (congestive) and diastolic (congestive) heart failure Z95.1 Presence of aortocoronary bypass graft M46.28 Osteomyelitis of vertebra, sacral and sacrococcygeal region Facility Procedures CPT4 Code Description Modifier Quantity [id number] G0277-(Facility Use Only) HBOT full body chamber, , 4 ICD-10 Diagnosis Description M46.28 Osteomyelitis of vertebra, sacral and sacrococcygeal region L89.154 Pressure ulcer of sacral region, stage 4 I50.42 Chronic combined systolic (congestive) and diastolic (congestive) heart failure Z95.1 Presence of aortocoronary bypass graft Physician Procedures Quantity CPT4 Code Description Modifier [id number] - WC PHYS HYPERBARIC OXYGEN THERAPY 1 ICD-10 Diagnosis Description M46.28 Osteomyelitis of vertebra, sacral and sacrococcygeal region L89.154 Pressure ulcer of sacral region, stage 4 I50.42 Chronic combined systolic (congestive) and diastolic (congestive) heart failure Z95.1 Presence of aortocoronary bypass graft Electronic Signature(s) Signed: 01/22/2023 11:00:52 AM By: [name] CHT EMT BS , , Signed: 01/25/2023 11:42:23 AM By: Geralyn Corwin DO Entered By: [name] on 01/22/2023 11:00:52

## 2023-01-25 NOTE — Progress Notes (Signed)
Sluka, Rahm A ([id number]) [id number] Page 1 of 1 Visit Report for 01/25/2023 SuperBill Details Patient Name: Date of Service: Joe Dills, [place] RK A. 01/25/2023 Medical Record Number: [id number] Patient Account Number: [id number] Date of Birth/Sex: Treating RN: 1956-02-03 (67 y.o. [name] Primary Care Provider: [name] Other Clinician: [name] Referring Provider: Treating Provider/Extender: [name] in Treatment: 21 Diagnosis Coding ICD-10 Codes Code Description [phone] Pressure ulcer of sacral region, stage 4 I50.42 Chronic combined systolic (congestive) and diastolic (congestive) heart failure Z95.1 Presence of aortocoronary bypass graft M46.28 Osteomyelitis of vertebra, sacral and sacrococcygeal region Facility Procedures CPT4 Code Description Modifier Quantity [id number] G0277-(Facility Use Only) HBOT full body chamber, , 4 ICD-10 Diagnosis Description M46.28 Osteomyelitis of vertebra, sacral and sacrococcygeal region L89.154 Pressure ulcer of sacral region, stage 4 I50.42 Chronic combined systolic (congestive) and diastolic (congestive) heart failure Z95.1 Presence of aortocoronary bypass graft Physician Procedures Quantity CPT4 Code Description Modifier [id number] 99183 - WC PHYS HYPERBARIC OXYGEN THERAPY 1 ICD-10 Diagnosis Description M46.28 Osteomyelitis of vertebra, sacral and sacrococcygeal region L89.154 Pressure ulcer of sacral region, stage 4 I50.42 Chronic combined systolic (congestive) and diastolic (congestive) heart failure Z95.1 Presence of aortocoronary bypass graft Electronic Signature(s) Signed: 01/25/2023 11:27:23 AM By: [name] CHT EMT BS , , Signed: 01/25/2023 1:47:27 PM By: Geralyn Corwin DO Entered By: [name] on 01/25/2023 11:27:22

## 2023-01-25 NOTE — Progress Notes (Addendum)
Cothron, Lenny [name] [id number]) [phone].pdf Page 1 of 2 Visit Report for 01/25/2023 HBO Details Patient Name: Date of Service: Joe [name], Joe [name]. 01/25/2023 8:00 [name] Medical Record Number: [id number] Patient Account Number: [id number] Date of Birth/Sex: Treating RN: 1956/07/24 (67 y.o. 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 HBO Treatment Details Treatment Number: 4 Patient Type: Outpatient Chamber Type: Monoplace Chamber Serial #: [id number] Treatment Protocol: 2.0 ATA with 90 minutes oxygen, and no air breaks Treatment Details Compression Rate Down: 1.0 psi / minute De-Compression Rate Up: 1.0 psi / minute Air breaks and breathing Decompress Decompress Compress Tx Pressure Begins Reached periods Begins Ends (leave unused spaces blank) Chamber Pressure (ATA 1 2 ------2 1 ) Clock Time (24 hr) 07:56 08:09 - - - - - - 09:39 09:55 Treatment Length: 119 (minutes) Treatment Segments: 4 Vital Signs Capillary Blood Glucose Reference Range: 80 - 120 mg / dl HBO Diabetic Blood Glucose Intervention Range: <131 mg/dl or >749 mg/dl Type: Time Vitals Blood Pulse: Respiratory Temperature: Capillary Blood Glucose Pulse Action Taken: Pressure: Rate: Glucose (mg/dl): Meter #: Oximetry (%) Taken: Pre 07:48 110/67 56 18 98.1 asymptomatic for bradycardia Post 09:59 106/68 58 18 98.1 asymptomatic for bradycardia Treatment Response Treatment Toleration: Well Treatment Completion Status: Treatment Completed without Adverse Event Treatment Notes Joe [name] arrived, prepared for treatment.  Vital signs were within normal range with the exception of heart rate below 60 bpm. He stated that he felt fine and denied any symptoms related to bradycardia. After performing [name] safety check, patient was placed in the chamber which was compressed with 100% oxygen at [name] rate of 2 psi/min. He tolerated the treatment and subsequent decompression of the chamber at [name] rate of 2 psi/min as well. Patient denied any issues with ear equalization and/or pain. Post-treatment vitals normal with the exception of heart rate of 58 bpm. He was stable upon discharge. Physician HBO Attestation: I certify that I supervised this HBO treatment in accordance with Medicare guidelines. [name] trained emergency response team is readily available per Yes hospital policies and procedures. Continue HBOT as ordered. Yes Electronic Signature(s) Signed: 01/25/2023 1:47:27 PM By: Geralyn Corwin DO Previous Signature: 01/25/2023 11:26:52 AM Version By: [name] CHT EMT BS , , Entered By: Geralyn Corwin on 01/25/2023 13:46:30 HBO Safety Checklist Details -------------------------------------------------------------------------------- Joe [name] ([id number]) [id number] Page 2 of 2 Patient Name: Date of Service: Joe [name], Joe [name]. 01/25/2023 8:00 [name] Medical Record Number: [id number] Patient Account Number: [id number] Date of Birth/Sex: Treating RN: Nov 10, 1955 (67 y.o. Joe [name], Bonita Quin Primary Care Lucresha Dismuke: [name] Other Clinician: [name] Referring Ezekiah Massie: Treating K[place]ton Duerst/Extender: [name] in Treatment: 21 HBO Safety Checklist Items Safety Checklist Consent Form Signed Patient voided / foley secured and emptied When did you last eato Breakfast Last dose of injectable or oral agent n/[name] Ostomy pouch emptied and vented if applicable NA All implantable devices assessed, documented and approved NA Intravenous access site  secured and place NA Valuables secured Linens and cotton and cotton/polyester blend (less than 51% polyester) Personal oil-based products / skin lotions / body lotions removed Wigs or hairpieces removed NA Smoking or tobacco materials removed NA Books / newspapers / magazines /  loose paper removed Cologne, aftershave, perfume and deodorant removed Jewelry removed (may wrap wedding band) Make-up removed NA Hair care products removed Battery operated devices (external) removed Heating patches and chemical warmers removed Titanium eyewear removed Nail polish cured greater than 10 hours Casting material cured greater than 10 hours NA Hearing aids removed NA Loose dentures or partials removed NA Prosthetics have been removed NA Patient demonstrates correct use of air break device (if applicable) Patient concerns have been addressed Patient grounding bracelet on and cord attached to chamber Specifics for Inpatients (complete in addition to above) Medication sheet sent with patient NA Intravenous medications needed or due during therapy sent with patient NA Drainage tubes (e.g. nasogastric tube or chest tube secured and vented) NA Endotracheal or Tracheotomy tube secured NA Cuff deflated of air and inflated with saline NA Airway suctioned NA Notes Paper version used prior to treatment start. Electronic Signature(s) Signed: 01/25/2023 8:47:45 AM By: [name] CHT EMT BS , , Entered By: [name] on 01/25/2023 08:47:45

## 2023-01-26 ENCOUNTER — Ambulatory Visit (HOSPITAL_COMMUNITY)
Admission: RE | Admit: 2023-01-26 | Discharge: 2023-01-26 | Disposition: A | Discharge status: Home / Self Care | Payer: Medicare Other | Source: Ambulatory Visit | Attending: Internal Medicine | Admitting: Internal Medicine

## 2023-01-26 ENCOUNTER — Other Ambulatory Visit (HOSPITAL_COMMUNITY): Payer: Medicare Other

## 2023-01-26 ENCOUNTER — Encounter (HOSPITAL_BASED_OUTPATIENT_CLINIC_OR_DEPARTMENT_OTHER): Payer: Medicare Other | Admitting: Internal Medicine

## 2023-01-26 DIAGNOSIS — M4628 Osteomyelitis of vertebra, sacral and sacrococcygeal region: Secondary | ICD-10-CM | POA: Diagnosis not present

## 2023-01-26 DIAGNOSIS — I5042 Chronic combined systolic (congestive) and diastolic (congestive) heart failure: Secondary | ICD-10-CM | POA: Diagnosis not present

## 2023-01-26 DIAGNOSIS — L89154 Pressure ulcer of sacral region, stage 4: Secondary | ICD-10-CM | POA: Diagnosis not present

## 2023-01-26 DIAGNOSIS — Z951 Presence of aortocoronary bypass graft: Secondary | ICD-10-CM

## 2023-01-26 LAB — BASIC METABOLIC PANEL
Anion gap: 7 (ref 5–15)
BUN: 20 mg/dL (ref 8–23)
CO2: 25 mmol/L (ref 22–32)
Calcium: 9 mg/dL (ref 8.9–10.3)
Chloride: 106 mmol/L (ref 98–111)
Creatinine, Ser: 0.89 mg/dL (ref 0.61–1.24)
GFR, Estimated: >60 mL/min (ref >60)
Glucose, Bld: 93 mg/dL (ref 70–99)
Potassium: 4 mmol/L (ref 3.5–5.1)
Sodium: 138 mmol/L (ref 135–145)

## 2023-01-26 NOTE — Progress Notes (Signed)
Katich, Woodruff [name] [id number]) [id number] Page 1 of 2 Visit Report for 01/26/2023 Arrival Information Details Patient Name: Date of Service: Joe [name], [place] Joe [name]. 01/26/2023 8:00 [name] Medical Record Number: [id number] Patient Account Number: [id number] Date of Birth/Sex: Treating RN: [birth year] (67 y.o. Joe [name] Primary Care Joe [name]: Joe [name] Other Clinician: Haywood [name] Referring Joe [name]: Treating Joe [name]/Extender: Joe [name] in [name]: 21 Visit Information History Since Last Visit All ordered tests and consults were completed: Yes Patient Arrived: Ambulatory Added or deleted any medications: No Arrival Time: 07:30 Any new allergies or adverse reactions: No Accompanied By: mother Had [name] fall or experienced change in No Transfer Assistance: None activities of daily living that may affect Patient Identification Verified: Yes risk of falls: Secondary Verification Process Completed: Yes Signs or symptoms of abuse/neglect since last visito No Patient Requires Transmission-Based Precautions: No Hospitalized since last visit: No Patient Has Alerts: Yes Implantable device outside of the clinic excluding No Patient Alerts: Patient on Blood Thinner cellular tissue based products placed in the center since last visit: Pain Present Now: No Electronic Signature(s) Signed: 01/26/2023 2:56:30 PM By: Joe [name] CHT EMT BS , , Entered By: Joe [name] on 01/26/2023 08:42:05 -------------------------------------------------------------------------------- Encounter Discharge Information Details Patient Name: Date of Service: Joe Dills, MA Joe [name]. 01/26/2023 8:00 [name] Medical Record Number: [id number] Patient Account Number: [id number] Date of Birth/Sex: Treating RN: [birth year] (67 y.o. Joe [name] Primary Care Joe [name]: Joe [name] Other Clinician: Haywood [name] Referring  Joe [name]: Treating Joe [name]/Extender: Joe [name] in [name]: 21 Encounter Discharge Information Items Discharge Condition: Stable Ambulatory Status: Ambulatory Discharge Destination: Home Transportation: Private Auto Accompanied By: self Schedule Follow-up Appointment: No Clinical Summary of Care: Electronic Signature(s) Signed: 01/26/2023 2:56:30 PM By: Joe [name] CHT EMT BS , , Entered By: Joe [name] on 01/26/2023 13:38:44 -------------------------------------------------------------------------------- Vitals Details Patient Name: Date of Service: Joe Dills, [place] Joe [name]. 01/26/2023 8:00 [name] Medical Record Number: [id number] Patient Account Number: [id number] Date of Birth/Sex: Treating RN: 1956/03/23 (67 y.o. Joe [name] Primary Care Joe [name]: Joe [name] Other Clinician: Haywood [name] Referring Joe [name]: Treating Joe [name]/Extender: Joe [name]: 21 Vital Signs Time Taken: 07:38 Temperature (F): 98.1 Joe [name] [name] ([id number]) [phone].pdf Page 2 of 2 Height (in): 67 Pulse (bpm): 57 Weight (lbs): 160 Respiratory Rate (breaths/min): 18 Body Mass Index (BMI): 25.1 Blood Pressure (mmHg): 100/58 Reference Range: 80 - 120 mg / dl Electronic Signature(s) Signed: 01/26/2023 2:56:30 PM By: Joe [name] CHT EMT BS , , Entered By: Joe [name] on 01/26/2023 08:42:35

## 2023-01-26 NOTE — Progress Notes (Signed)
[name] [name] [id number]) [id number] Page 1 of 2 Visit Report for 01/26/2023 HBO Details Patient Name: Date of Service: [name], [place] [name]. 01/26/2023 8:00 [name] [name] Medical Record Number: [id number] Patient Account Number: [id number] Date of Birth/Sex: Treating RN: 04-05-1956 (67 y.o. 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 HBO Treatment Details Treatment Number: 5 Patient Type: Outpatient Chamber Type: Monoplace Chamber Serial #: [id number] Treatment Protocol: 2.0 ATA with 90 minutes oxygen, and no air breaks Treatment Details Compression Rate Down: 1.5 psi / minute De-Compression Rate Up: 2.0 psi / minute Air breaks and breathing Decompress Decompress Compress Tx Pressure Begins Reached periods Begins Ends (leave unused spaces blank) Chamber Pressure (ATA 1 2 ------2 1 ) Clock Time (24 hr) 07:47 08:02 - - - - - - 09:32 09:42 Treatment Length: 115 (minutes) Treatment Segments: 4 Vital Signs Capillary Blood Glucose Reference Range: 80 - 120 mg / dl HBO Diabetic Blood Glucose Intervention Range: <131 mg/dl or >782 mg/dl Type: Time Vitals Blood Respiratory Capillary Blood Glucose Pulse Action Pulse: Temperature: Taken: Pressure: Rate: Glucose (mg/dl): Meter #: Oximetry (%) Taken: Pre 07:38 100/58 57 18 98.1 none per protocol Post 09:47 119/81 48 18 97.5 none per protocol Treatment Response Treatment Toleration: Well Treatment Completion Status: Treatment Completed without Adverse Event Treatment Notes [name] [name] arrived and prepared for treatment. Vital signs showed  diasystolic pressure below 60 mmHg and pulse less than 60 bpm. Patient denied any symptoms related to hypotension/bradycardia stating that he feels fine. After performing [name] safety check, patient was placed in the chamber which was compressed at [name] rate of 1 psi/min. [name] [name] tolerated the treatment and subsequent decompression of the chamber at [name] rate of 1 psi/min. He denied any issues with ear equalization and/or pain. Post-treatment vital signs: 48 bpm, he denied any symptoms related to bradycardia saying that he felt fine. He was stable upon discharge. Physician HBO Attestation: I certify that I supervised this HBO treatment in accordance with Medicare guidelines. [name] trained emergency response team is readily available per Yes hospital policies and procedures. Continue HBOT as ordered. Yes Electronic Signature(s) Signed: 01/26/2023 3:28:22 PM By: [name] Corwin DO Previous Signature: 01/26/2023 2:56:30 PM Version By: [name] [name] CHT EMT BS , , Entered By: [name] [name] on 01/26/2023 15:27:23 [name] [name], [name] [name] ([id number]) [id number] Page 2 of 2 -------------------------------------------------------------------------------- HBO Safety Checklist Details Patient Name: Date of Service: [name], [place] [name]. 01/26/2023 8:00 [name] [name] Medical Record Number: [id number] Patient Account Number: [id number] Date of Birth/Sex: Treating RN: 1956-06-27 (67 y.o. [name] [name] Primary Care [name] [name]: [name] [name] Other Clinician: Haywood [name] Referring [name] [name]: Treating [name] [name]/Extender: [name] [name] in Treatment: 21 HBO Safety Checklist Items Safety Checklist Consent Form Signed Patient voided / foley secured and emptied When did you last eato Breakfast Last dose of injectable or oral agent n/[name] Ostomy pouch emptied and vented if applicable NA All implantable devices assessed, documented and approved NA Intravenous access  site secured and place NA Valuables secured Linens and cotton and cotton/polyester blend (less than 51% polyester) Personal oil-based products / skin lotions / body lotions removed Wigs or hairpieces removed NA Smoking or tobacco materials removed NA Books / newspapers / magazines /  loose paper removed Cologne, aftershave, perfume and deodorant removed Jewelry removed (may wrap wedding band) Make-up removed NA Hair care products removed Battery operated devices (external) removed Heating patches and chemical warmers removed Titanium eyewear removed Nail polish cured greater than 10 hours Casting material cured greater than 10 hours NA Hearing aids removed NA Loose dentures or partials removed NA Prosthetics have been removed NA Patient demonstrates correct use of air break device (if applicable) Patient concerns have been addressed Patient grounding bracelet on and cord attached to chamber Specifics for Inpatients (complete in addition to above) Medication sheet sent with patient NA Intravenous medications needed or due during therapy sent with patient NA Drainage tubes (e.g. nasogastric tube or chest tube secured and vented) NA Endotracheal or Tracheotomy tube secured NA Cuff deflated of air and inflated with saline NA Airway suctioned NA Notes Paper version used prior to treatment start. Electronic Signature(s) Signed: 01/26/2023 2:56:30 PM By: [name] [name] CHT EMT BS , , Entered By: [name] [name] on 01/26/2023 08:43:33

## 2023-01-26 NOTE — Progress Notes (Signed)
Bernabe, Kurt A [id number] [id number] Page 1 of 1 Visit Report for 01/26/2023 SuperBill Details Patient Name: Date of Service: Joe [name], [place] RK A. 01/26/2023 Medical Record Number: [id number] Patient Account Number: [id number] Date of Birth/Sex: Treating RN: [birth year] (67 y.o. [name] Primary Care Provider: [name] Other Clinician: [name] Referring Provider: Treating Provider/Extender: [name] in Treatment: 21 Diagnosis Coding ICD-10 Codes Code Description [phone] Pressure ulcer of sacral region, stage 4 I50.42 Chronic combined systolic (congestive) and diastolic (congestive) heart failure Z95.1 Presence of aortocoronary bypass graft M46.28 Osteomyelitis of vertebra, sacral and sacrococcygeal region Facility Procedures CPT4 Code Description Modifier Quantity [id number] G0277-(Facility Use Only) HBOT full body chamber, , 4 ICD-10 Diagnosis Description M46.28 Osteomyelitis of vertebra, sacral and sacrococcygeal region L89.154 Pressure ulcer of sacral region, stage 4 I50.42 Chronic combined systolic (congestive) and diastolic (congestive) heart failure Z95.1 Presence of aortocoronary bypass graft Physician Procedures Quantity CPT4 Code Description Modifier [id number] - WC PHYS HYPERBARIC OXYGEN THERAPY 1 ICD-10 Diagnosis Description M46.28 Osteomyelitis of vertebra, sacral and sacrococcygeal region L89.154 Pressure ulcer of sacral region, stage 4 I50.42 Chronic combined systolic (congestive) and diastolic (congestive) heart failure Z95.1 Presence of aortocoronary bypass graft Electronic Signature(s) Signed: 01/26/2023 2:56:30 PM By: [name] CHT EMT BS , , Signed: 01/26/2023 3:28:22 PM By: Geralyn Corwin DO Entered By: [name] on 01/26/2023 13:38:18

## 2023-01-27 ENCOUNTER — Encounter (HOSPITAL_BASED_OUTPATIENT_CLINIC_OR_DEPARTMENT_OTHER): Payer: Medicare Other | Admitting: Physician Assistant

## 2023-01-27 DIAGNOSIS — M4628 Osteomyelitis of vertebra, sacral and sacrococcygeal region: Secondary | ICD-10-CM | POA: Diagnosis not present

## 2023-01-28 ENCOUNTER — Encounter (HOSPITAL_BASED_OUTPATIENT_CLINIC_OR_DEPARTMENT_OTHER): Payer: Medicare Other | Admitting: Internal Medicine

## 2023-01-28 DIAGNOSIS — L89154 Pressure ulcer of sacral region, stage 4: Secondary | ICD-10-CM | POA: Diagnosis not present

## 2023-01-28 DIAGNOSIS — Z951 Presence of aortocoronary bypass graft: Secondary | ICD-10-CM

## 2023-01-28 DIAGNOSIS — M4628 Osteomyelitis of vertebra, sacral and sacrococcygeal region: Secondary | ICD-10-CM | POA: Diagnosis not present

## 2023-01-28 DIAGNOSIS — I5042 Chronic combined systolic (congestive) and diastolic (congestive) heart failure: Secondary | ICD-10-CM

## 2023-01-28 NOTE — Progress Notes (Signed)
[name], [name] ([id number]) [id number] Page 1 of 8 Visit Report for 01/28/2023 Arrival Information Details Patient Name: Date of Service: Joe [name], Joe RK A. 01/28/2023 8:00 A M Medical Record Number: [id number] Patient Account Number: [id number] Date of Birth/Sex: Treating RN: November 24, 1955 (67 y.o. Joe [name] Primary Care Joe [name]: Joe [name] Other Clinician: Referring Joe [name]: Treating Joe [name]/Extender: Joe [name] in Treatment: 22 Visit Information History Since Last Visit Added or deleted any medications: No Patient Arrived: Ambulatory Any new allergies or adverse reactions: No Arrival Time: 08:10 Had a fall or experienced change in No Accompanied By: Mother activities of daily living that may affect Transfer Assistance: None risk of falls: Patient Identification Verified: Yes Signs or symptoms of abuse/neglect since last visito No Secondary Verification Process Completed: Yes Hospitalized since last visit: No Patient Requires Transmission-Based Precautions: No Implantable device outside of the clinic excluding No Patient Has Alerts: Yes cellular tissue based products placed in the center Patient Alerts: Patient on Blood Thinner since last visit: Has Dressing in Place as Prescribed: Yes Pain Present Now: No Electronic Signature(s) Signed: 01/28/2023 2:41:21 PM By: Joe Stall RN, BSN Entered By: Joe [name] on 01/28/2023 08:23:26 -------------------------------------------------------------------------------- Clinic Level of Care Assessment Details Patient Name: Date of Service: Joe TI, [name] 01/28/2023 8:00 A M Medical Record Number: [id number] Patient Account Number: [id number] Date of Birth/Sex: Treating RN: Jul 02, 1956 (67 y.o. Joe [name] Primary Care Joe [name]: Joe [name] Other Clinician: Referring Joe [name]: Treating Joe [name]: Joe [name] in Treatment: 22 Clinic Level of Care Assessment Items TOOL 4 Quantity Score X- 1 0 Use when only an EandM is performed on FOLLOW-UP visit ASSESSMENTS - Nursing Assessment / Reassessment X- 1 10 Reassessment of Co-morbidities (includes updates in patient status) X- 1 5 Reassessment of Adherence to Treatment Plan ASSESSMENTS - Wound and Skin A ssessment / Reassessment X - Simple Wound Assessment / Reassessment - one wound 1 5 []  - 0 Complex Wound Assessment / Reassessment - multiple wounds []  - 0 Dermatologic / Skin Assessment (not related to wound area) ASSESSMENTS - Focused Assessment []  - 0 Circumferential Edema Measurements - multi extremities []  - 0 Nutritional Assessment / Counseling / Intervention []  - 0 Lower Extremity Assessment (monofilament, tuning fork, pulses) []  - 0 Peripheral Arterial Disease Assessment (using hand held doppler) ASSESSMENTS - Ostomy and/or Continence Assessment and Care []  - 0 Incontinence Assessment and Management []  - 0 Ostomy Care Assessment and Management (repouching, etc.) PROCESS - Coordination of Care X - Simple Patient / Family Education for ongoing care 1 15 [name], [name] ([id number]) [id number] Page 2 of 8 []  - 0 Complex (extensive) Patient / Family Education for ongoing care X- 1 10 Staff obtains Chiropractor, Records, T Results / Process Orders est []  - 0 Staff telephones HHA, Nursing Homes / Clarify orders / etc []  - 0 Routine Transfer to another Facility (non-emergent condition) []  - 0 Routine Hospital Admission (non-emergent condition) []  - 0 New Admissions / Manufacturing engineer / Ordering NPWT Apligraf, etc. , []  - 0 Emergency Hospital Admission (emergent condition) X- 1 10 Simple Discharge Coordination []  - 0 Complex (extensive) Discharge Coordination PROCESS - Special Needs []  - 0 Pediatric / Minor Patient Management []  - 0 Isolation Patient Management []  - 0 Hearing /  Language / Visual special needs []  - 0 Assessment of Community assistance (transportation, D/C planning, etc.) []  - 0 Additional assistance / Altered mentation []  - 0 Support Surface(s) Assessment (bed,  cushion, seat, etc.) INTERVENTIONS - Wound Cleansing / Measurement X - Simple Wound Cleansing - one wound 1 5 []  - 0 Complex Wound Cleansing - multiple wounds X- 1 5 Wound Imaging (photographs - any number of wounds) []  - 0 Wound Tracing (instead of photographs) X- 1 5 Simple Wound Measurement - one wound []  - 0 Complex Wound Measurement - multiple wounds INTERVENTIONS - Wound Dressings X - Small Wound Dressing one or multiple wounds 1 10 []  - 0 Medium Wound Dressing one or multiple wounds []  - 0 Large Wound Dressing one or multiple wounds []  - 0 Application of Medications - topical []  - 0 Application of Medications - injection INTERVENTIONS - Miscellaneous []  - 0 External ear exam []  - 0 Specimen Collection (cultures, biopsies, blood, body fluids, etc.) []  - 0 Specimen(s) / Culture(s) sent or taken to Lab for analysis []  - 0 Patient Transfer (multiple staff / Nurse, adult / Similar devices) []  - 0 Simple Staple / Suture removal (25 or less) []  - 0 Complex Staple / Suture removal (26 or more) []  - 0 Hypo / Hyperglycemic Management (close monitor of Blood Glucose) []  - 0 Ankle / Brachial Index (ABI) - do not check if billed separately X- 1 5 Vital Signs Has the patient been seen at the hospital within the last three years: Yes Total Score: 85 Level Of Care: New/Established - Level 3 Electronic Signature(s) Signed: 01/28/2023 2:41:21 PM By: Joe Stall RN, BSN Entered By: Joe [name] on 01/28/2023 08:54:29 [name], [name] ([id number]) [id number] Page 3 of 8 -------------------------------------------------------------------------------- Encounter Discharge Information Details Patient Name: Date of Service: Joe [name], Joe RK A.  01/28/2023 8:00 A M Medical Record Number: [id number] Patient Account Number: [id number] Date of Birth/Sex: Treating RN: 1956/03/18 (67 y.o. Joe [name] Primary Care Joe [name]: Joe [name] Other Clinician: Referring Joe [name]: Treating Joe [name]: Joe [name] in Treatment: 22 Encounter Discharge Information Items Discharge Condition: Stable Ambulatory Status: Ambulatory Discharge Destination: Other (Note Required) Telephoned: No Orders Sent: No Transportation: Other Accompanied By: mother Schedule Follow-up Appointment: Yes Clinical Summary of Care: Notes sent HBO. Electronic Signature(s) Signed: 01/28/2023 2:41:21 PM By: Joe Stall RN, BSN Entered By: Joe [name] on 01/28/2023 08:55:37 -------------------------------------------------------------------------------- Lower Extremity Assessment Details Patient Name: Date of Service: Joe Dills, [name] 01/28/2023 8:00 A M Medical Record Number: [id number] Patient Account Number: [id number] Date of Birth/Sex: Treating RN: 1956/06/25 (67 y.o. Joe [name] Primary Care Meg Niemeier: Joe [name] Other Clinician: Referring Yasmeen Manka: Treating Yamen Castrogiovanni/Extender: Joe [name] in Treatment: 22 Electronic Signature(s) Signed: 01/28/2023 2:41:21 PM By: Joe Stall RN, BSN Entered By: Joe [name] on 01/28/2023 08:25:31 -------------------------------------------------------------------------------- Multi Wound Chart Details Patient Name: Date of Service: Joe Dills, [name] 01/28/2023 8:00 A M Medical Record Number: [id number] Patient Account Number: [id number] Date of Birth/Sex: Treating RN: 1956-08-11 (67 y.o. M) Primary Care Chivon Lepage: Joe [name] Other Clinician: Referring Mykle Pascua: Treating Keymani Glynn/Extender: Joe [name] in Treatment: 22 Vital Signs Height(in): 67 Pulse(bpm): 72 Weight(lbs):  160 Blood Pressure(mmHg): 121/71 Body Mass Index(BMI): 25.1 Temperature(F): 98.6 Respiratory Rate(breaths/min): 20 [1:Photos:] [N/A:N/A] Sacrum N/A N/A Wound Location: Pressure Injury N/A N/A Wounding Event: Pressure Ulcer N/A N/A Primary Etiology: Coronary Artery Disease, N/A N/A Comorbid History: Hypertension, Osteoarthritis 08/07/2022 N/A N/A Date Acquired: 22 N/A N/A Weeks of Treatment: Open N/A N/A Wound Status: No N/A N/A Wound Recurrence: 0.1x0.1x0.2 N/A N/A Measurements L x W x D (cm) 0.008 N/A N/A A (cm) :  rea 0.002 N/A N/A Volume (cm) : 100.00% N/A N/A % Reduction in A rea: 100.00% N/A N/A % Reduction in Volume: Category/Stage IV N/A N/A Classification: None Present N/A N/A Exudate A mount: Distinct, outline attached N/A N/A Wound Margin: None Present (0%) N/A N/A Granulation A mount: None Present (0%) N/A N/A Necrotic A mount: Fascia: No N/A N/A Exposed Structures: Fat Layer (Subcutaneous Tissue): No Tendon: No Muscle: No Joint: No Bone: No Large (67-100%) N/A N/A Epithelialization: Excoriation: No N/A N/A Periwound Skin Texture: Induration: No Callus: No Crepitus: No Rash: No Scarring: No Maceration: No N/A N/A Periwound Skin Moisture: Dry/Scaly: No Atrophie Blanche: No N/A N/A Periwound Skin Color: Cyanosis: No Ecchymosis: No Erythema: No Hemosiderin Staining: No Mottled: No Pallor: No Rubor: No Treatment Notes Wound #1 (Sacrum) Cleanser Soap and Water Discharge Instruction: May shower and wash wound with dial antibacterial soap and water prior to dressing change. Wound Cleanser Discharge Instruction: Cleanse the wound with wound cleanser prior to applying a clean dressing using gauze sponges, not tissue or cotton balls. Peri-Wound Care Skin Prep Discharge Instruction: Use skin prep as directed Topical Antibiotic ointment Discharge Instruction: apply a small amount ointment. Primary Dressing Secondary  Dressing Woven Gauze Sponge, Non-Sterile 4x4 in Discharge Instruction: Apply over primary dressing as directed. Zetuvit Plus Silicone Border Dressing 4x4 (in/in) Discharge Instruction: Apply silicone border over primary dressing as directed. Secured With Compression Wrap Compression Stockings Add-Ons [name], [name] ([id number]) [phone].pdf Page 5 of 8 Electronic Signature(s) Signed: 01/28/2023 11:07:03 AM By: Geralyn Corwin DO Entered By: Geralyn Corwin on 01/28/2023 09:13:27 -------------------------------------------------------------------------------- Multi-Disciplinary Care Plan Details Patient Name: Date of Service: Joe Dills, [name] 01/28/2023 8:00 A M Medical Record Number: [id number] Patient Account Number: [id number] Date of Birth/Sex: Treating RN: January 31, 1956 (67 y.o. Joe [name] Primary Care Jalin Alicea: Joe [name] Other Clinician: Referring Kacie Huxtable: Treating Lior Cartelli/Extender: Joe [name] in Treatment: 22 Active Inactive Wound/Skin Impairment Nursing Diagnoses: Impaired tissue integrity Knowledge deficit related to ulceration/compromised skin integrity Goals: Patient will have a decrease in wound volume by X% from date: (specify in notes) Date Initiated: 08/27/2022 Target Resolution Date: 02/12/2023 Goal Status: Active Patient/caregiver will verbalize understanding of skin care regimen Date Initiated: 08/27/2022 Target Resolution Date: 02/12/2023 Goal Status: Active Ulcer/skin breakdown will have a volume reduction of 30% by week 4 Date Initiated: 08/27/2022 Date Inactivated: 12/17/2022 Target Resolution Date: 11/14/2022 Goal Status: Met Interventions: Assess patient/caregiver ability to obtain necessary supplies Assess patient/caregiver ability to perform ulcer/skin care regimen upon admission and as needed Assess ulceration(s) every visit Notes: Electronic Signature(s) Signed: 01/28/2023  2:41:21 PM By: Joe Stall RN, BSN Entered By: Joe [name] on 01/28/2023 08:48:25 -------------------------------------------------------------------------------- Pain Assessment Details Patient Name: Date of Service: Joe Dills, [name] 01/28/2023 8:00 A M Medical Record Number: [id number] Patient Account Number: [id number] Date of Birth/Sex: Treating RN: 08-03-1956 (67 y.o. Joe [name] Primary Care Shacara Cozine: Joe [name] Other Clinician: Referring Charliee Krenz: Treating Kalilah Barua/Extender: Joe [name] in Treatment: 22 Active Problems Location of Pain Severity and Description of Pain Patient Has Paino No Site Locations [name], [name] ([id number]) [id number] Page 6 of 8 Pain Management and Medication Current Pain Management: Electronic Signature(s) Signed: 01/28/2023 2:41:21 PM By: Joe Stall RN, BSN Entered By: Joe [name] on 01/28/2023 08:25:20 -------------------------------------------------------------------------------- Patient/Caregiver Education Details Patient Name: Date of Service: Joe Dills, [name] 4/18/2024andnbsp8:00 A M Medical Record Number: [id number] Patient Account Number: [id number] Date of Birth/Gender: Treating RN: 05/07/1956 (66 y.o.  Joe [name] Primary Care Physician: Joe [name] Other Clinician: Referring Physician: Treating Physician/Extender: Joe [name] in Treatment: 22 Education Assessment Education Provided To: Patient and Caregiver Education Topics Provided Hyperbaric Oxygenation: Handouts: Hyperbaric Oxygen Methods: Explain/Verbal Responses: Reinforcements needed Electronic Signature(s) Signed: 01/28/2023 2:41:21 PM By: Joe Stall RN, BSN Entered By: Joe [name] on 01/28/2023 08:49:16 -------------------------------------------------------------------------------- Wound Assessment Details Patient Name: Date of  Service: Joe Dills, [name] 01/28/2023 8:00 A M Medical Record Number: [id number] Patient Account Number: [id number] Date of Birth/Sex: Treating RN: Sep 24, 1956 (67 y.o. Joe [name] Primary Care Sherita Decoste: Joe [name] Other Clinician: Referring Diamonique Ruedas: Treating Taraya Steward/Extender: [name] in Treatment: 22 Wound Status Wound Number: 1 Primary Etiology: Pressure Ulcer Wound Location: Sacrum Wound Status: Open Wounding Event: Pressure Injury Comorbid History: Coronary Artery Disease, Hypertension, Osteoarthritis Date Acquired: 08/07/2022 Weeks Of Treatment: 22 [name], [name] ([id number]) [id number] Page 7 of 8 Clustered Wound: No Photos Wound Measurements Length: (cm) 0.1 Width: (cm) 0.1 Depth: (cm) 0.2 Area: (cm) 0.008 Volume: (cm) 0.002 % Reduction in Area: 100% % Reduction in Volume: 100% Epithelialization: Large (67-100%) Tunneling: No Undermining: No Wound Description Classification: Category/Stage IV Wound Margin: Distinct, outline attached Exudate Amount: None Present Foul Odor After Cleansing: No Slough/Fibrino No Wound Bed Granulation Amount: None Present (0%) Exposed Structure Necrotic Amount: None Present (0%) Fascia Exposed: No Fat Layer (Subcutaneous Tissue) Exposed: No Tendon Exposed: No Muscle Exposed: No Joint Exposed: No Bone Exposed: No Periwound Skin Texture Texture Color No Abnormalities Noted: No No Abnormalities Noted: No Callus: No Atrophie Blanche: No Crepitus: No Cyanosis: No Excoriation: No Ecchymosis: No Induration: No Erythema: No Rash: No Hemosiderin Staining: No Scarring: No Mottled: No Pallor: No Moisture Rubor: No No Abnormalities Noted: No Dry / Scaly: No Maceration: No Treatment Notes Wound #1 (Sacrum) Cleanser Soap and Water Discharge Instruction: May shower and wash wound with dial antibacterial soap and water prior to dressing  change. Wound Cleanser Discharge Instruction: Cleanse the wound with wound cleanser prior to applying a clean dressing using gauze sponges, not tissue or cotton balls. Peri-Wound Care Skin Prep Discharge Instruction: Use skin prep as directed Topical Antibiotic ointment Discharge Instruction: apply a small amount ointment. Primary Dressing Secondary Dressing Woven Gauze Sponge, Non-Sterile 4x4 in [name], [name] ([id number]) [id number] Page 8 of 8 Discharge Instruction: Apply over primary dressing as directed. Zetuvit Plus Silicone Border Dressing 4x4 (in/in) Discharge Instruction: Apply silicone border over primary dressing as directed. Secured With Compression Wrap Compression Stockings Facilities manager) Signed: 01/28/2023 2:41:21 PM By: Joe Stall RN, BSN Entered By: Joe [name] on 01/28/2023 08:30:59 -------------------------------------------------------------------------------- Vitals Details Patient Name: Date of Service: Joe Dills, [name] 01/28/2023 8:00 A M Medical Record Number: [id number] Patient Account Number: [id number] Date of Birth/Sex: Treating RN: May 17, 1956 (67 y.o. Joe [name] Primary Care Deneene Tarver: Joe [name] Other Clinician: Referring Nieko Clarin: Treating Denea Cheaney/Extender: Joe [name] in Treatment: 22 Vital Signs Time Taken: 08:23 Temperature (F): 98.6 Height (in): 67 Pulse (bpm): 72 Weight (lbs): 160 Respiratory Rate (breaths/min): 20 Body Mass Index (BMI): 25.1 Blood Pressure (mmHg): 121/71 Reference Range: 80 - 120 mg / dl Electronic Signature(s) Signed: 01/28/2023 2:41:21 PM By: Joe Stall RN, BSN Entered By: Joe [name] on 01/28/2023 08:25:13

## 2023-01-28 NOTE — Progress Notes (Signed)
Zamudio, Ples A ([id number]) [id number] Page 1 of 2 Visit Report for 01/27/2023 Arrival Information Details Patient Name: Date of Service: Theo Dills, [place] RK A. 01/27/2023 8:00 A M Medical Record Number: [id number] Patient Account Number: [id number] Date of Birth/Sex: Treating RN: [birth year] (67 y.o. [name], [name] Primary Care Lysette Lindenbaum: [name] Other Clinician: [name] Referring Madiline Saffran: Treating Lizvette Lightsey/Extender: Valla Leaver in Treatment: 21 Visit Information History Since Last Visit All ordered tests and consults were completed: Yes Patient Arrived: Ambulatory Added or deleted any medications: No Arrival Time: 07:32 Any new allergies or adverse reactions: No Accompanied By: mother Had a fall or experienced change in No Transfer Assistance: None activities of daily living that may affect Patient Identification Verified: Yes risk of falls: Secondary Verification Process Completed: Yes Signs or symptoms of abuse/neglect since last visito No Patient Requires Transmission-Based Precautions: No Hospitalized since last visit: No Patient Has Alerts: Yes Implantable device outside of the clinic excluding No Patient Alerts: Patient on Blood Thinner cellular tissue based products placed in the center since last visit: Pain Present Now: No Electronic Signature(s) Signed: 01/28/2023 3:05:40 PM By: [name] CHT EMT BS , , Entered By: [name] on 01/27/2023 12:26:33 -------------------------------------------------------------------------------- Encounter Discharge Information Details Patient Name: Date of Service: Theo Dills, [name] 01/27/2023 8:00 A M Medical Record Number: [id number] Patient Account Number: [id number] Date of Birth/Sex: Treating RN: August 29, 1956 (68 y.o. [name] Primary Care Nazly Digilio: [name] Other Clinician: [name] Referring  Ragan Duhon: Treating Codee Bloodworth/Extender: Valla Leaver in Treatment: 21 Encounter Discharge Information Items Discharge Condition: Stable Ambulatory Status: Ambulatory Discharge Destination: Home Transportation: Private Auto Accompanied By: mother Schedule Follow-up Appointment: No Clinical Summary of Care: Electronic Signature(s) Signed: 01/28/2023 3:05:40 PM By: [name] CHT EMT BS , , Entered By: [name] on 01/27/2023 12:26:02 -------------------------------------------------------------------------------- Vitals Details Patient Name: Date of Service: Theo Dills, [name] 01/27/2023 8:00 A M Medical Record Number: [id number] Patient Account Number: [id number] Date of Birth/Sex: Treating RN: 06-Jan-1956 (67 y.o. [name] Primary Care Romina Divirgilio: [name] Other Clinician: [name] Referring Venia Riveron: Treating Zaydyn Havey/Extender: Francee Gentile Weeks in Treatment: 21 Vital Signs Time Taken: 07:44 Temperature (F): 97.9 Fergeson, Gamal A ([id number]) [id number] Page 2 of 2 Height (in): 67 Pulse (bpm): 66 Weight (lbs): 160 Respiratory Rate (breaths/min): 18 Body Mass Index (BMI): 25.1 Blood Pressure (mmHg): 106/72 Reference Range: 80 - 120 mg / dl Electronic Signature(s) Signed: 01/28/2023 3:05:40 PM By: [name] CHT EMT BS , , Entered By: [name] on 01/27/2023 10:24:06

## 2023-01-28 NOTE — Progress Notes (Signed)
Kleiner, Cornelio A ([id number]) [id number] Page 1 of 1 Visit Report for 01/27/2023 SuperBill Details Patient Name: Date of Service: Joe [name], [place] RK A. 01/27/2023 Medical Record Number: [id number] Patient Account Number: [id number] Date of Birth/Sex: Treating RN: Mar 14, 1956 (67 y.o. [name], [name] Primary Care Provider: [name] Other Clinician: [name] Referring Provider: Treating Provider/Extender: Valla Leaver in Treatment: 21 Diagnosis Coding ICD-10 Codes Code Description [phone] Pressure ulcer of sacral region, stage 4 I50.42 Chronic combined systolic (congestive) and diastolic (congestive) heart failure Z95.1 Presence of aortocoronary bypass graft M46.28 Osteomyelitis of vertebra, sacral and sacrococcygeal region Facility Procedures CPT4 Code Description Modifier Quantity [id number] G0277-(Facility Use Only) HBOT full body chamber, , 4 ICD-10 Diagnosis Description M46.28 Osteomyelitis of vertebra, sacral and sacrococcygeal region L89.154 Pressure ulcer of sacral region, stage 4 I50.42 Chronic combined systolic (congestive) and diastolic (congestive) heart failure Z95.1 Presence of aortocoronary bypass graft Physician Procedures Quantity CPT4 Code Description Modifier [id number] - WC PHYS HYPERBARIC OXYGEN THERAPY 1 ICD-10 Diagnosis Description M46.28 Osteomyelitis of vertebra, sacral and sacrococcygeal region L89.154 Pressure ulcer of sacral region, stage 4 I50.42 Chronic combined systolic (congestive) and diastolic (congestive) heart failure Z95.1 Presence of aortocoronary bypass graft Electronic Signature(s) Signed: 01/27/2023 6:42:55 PM By: Allen Derry PA-C Signed: 01/28/2023 3:05:40 PM By: [name] CHT EMT BS , , Entered By: [name] on 01/27/2023 12:25:36

## 2023-01-28 NOTE — Progress Notes (Addendum)
Joe [name] [id number]) [id number] Page 1 of 2 Visit Report for 01/28/2023 Arrival Information Details Patient Name: Date of Service: Joe [name], [place] [name]. 01/28/2023 9:30 [name] Medical Record Number: [id number] Patient Account Number: [id number] Date of Birth/Sex: Treating RN: Sep 21, 1956 (67 y.o. [name] Primary Care [name]: [name] Other Clinician: [name] Referring [name]: Treating [name]: [name] in Treatment: 22 Visit Information History Since Last Visit All ordered tests and consults were completed: Yes Patient Arrived: Ambulatory Added or deleted any medications: No Arrival Time: 08:45 Any new allergies or adverse reactions: No Accompanied By: mother Had [name] fall or experienced change in No Transfer Assistance: None activities of daily living that may affect Patient Identification Verified: Yes risk of falls: Secondary Verification Process Completed: Yes Signs or symptoms of abuse/neglect since last visito No Patient Requires Transmission-Based Precautions: No Hospitalized since last visit: No Patient Has Alerts: Yes Implantable device outside of the clinic excluding No Patient Alerts: Patient on Blood Thinner cellular tissue based products placed in the center since last visit: Pain Present Now: No Electronic Signature(s) Signed: 01/28/2023 10:29:44 AM By: [name] CHT EMT BS , , Entered By: [name] on 01/28/2023 10:29:44 -------------------------------------------------------------------------------- Encounter Discharge Information Details Patient Name: Date of Service: Joe Dills, [place] [name]. 01/28/2023 9:30 [name] Medical Record Number: [id number] Patient Account Number: [id number] Date of Birth/Sex: Treating RN: 07/26/1956 (67 y.o. [name] Primary Care Angel Hobdy: [name] Other Clinician: [name] Referring  Katesha Eichel: Treating Natasja Niday/Extender: [name] in Treatment: 22 Encounter Discharge Information Items Discharge Condition: Stable Ambulatory Status: Ambulatory Discharge Destination: Home Transportation: Private Auto Accompanied By: mother Schedule Follow-up Appointment: No Clinical Summary of Care: Electronic Signature(s) Signed: 01/28/2023 12:58:45 PM By: [name] CHT EMT BS , , Entered By: [name] on 01/28/2023 12:58:44 -------------------------------------------------------------------------------- Vitals Details Patient Name: Date of Service: Joe Dills, [place] [name]. 01/28/2023 9:30 [name] Medical Record Number: [id number] Patient Account Number: [id number] Date of Birth/Sex: Treating RN: 05/10/1956 (67 y.o. [name] Primary Care Lariza Cothron: [name] Other Clinician: [name] Referring Virgle Arth: Treating [name]: [name] in Treatment: 22 Vital Signs Time Taken: 08:23 Temperature (F): 98.6 Joe [name] ([id number]) [id number] Page 2 of 2 Height (in): 67 Pulse (bpm): 72 Weight (lbs): 160 Respiratory Rate (breaths/min): 18 Body Mass Index (BMI): 25.1 Blood Pressure (mmHg): 121/71 Reference Range: 80 - 120 mg / dl Electronic Signature(s) Signed: 01/28/2023 10:35:00 AM By: [name] CHT EMT BS , , Entered By: [name] on 01/28/2023 10:35:00

## 2023-01-28 NOTE — Progress Notes (Addendum)
[name], [name] [id number]) [id number] Page 1 of 2 Visit Report for 01/28/2023 HBO Details Patient Name: Date of Service: Joe [name], [place] RK A. 01/28/2023 9:30 A M Medical Record Number: [id number] Patient Account Number: [id number] Date of Birth/Sex: Treating RN: [birth year] (67 y.o. 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 HBO Treatment Details Treatment Number: 7 Patient Type: Outpatient Chamber Type: Monoplace Chamber Serial #: [id number] Treatment Protocol: 2.0 ATA with 90 minutes oxygen, and no air breaks Treatment Details Compression Rate Down: 1.5 psi / minute De-Compression Rate Up: 2.0 psi / minute Air breaks and breathing Decompress Decompress Compress Tx Pressure Begins Reached periods Begins Ends (leave unused spaces blank) Chamber Pressure (ATA 1 2 ------2 1 ) Clock Time (24 hr) 08:49 08:59 - - - - - - 10:29 10:36 Treatment Length: 107 (minutes) Treatment Segments: 4 Vital Signs Capillary Blood Glucose Reference Range: 80 - 120 mg / dl HBO Diabetic Blood Glucose Intervention Range: <131 mg/dl or >782 mg/dl Type: Time Vitals Blood Respiratory Capillary Blood Glucose Pulse Action Pulse: Temperature: Taken: Pressure: Rate: Glucose (mg/dl): Meter #: Oximetry (%) Taken: Pre 08:23 121/71 72 18 98.6 none per protocol Post 10:39 121/81 50 18 97.3 none per protocol Treatment Response Treatment Toleration: Well Treatment Completion Status: Treatment Completed without Adverse Event Treatment Notes Joe [name] arrived and prepared for treatment. His vital signs  were normal prior to treatment. After performing safety check, patient was placed in the chamber which was compressed with 100% oxygen at a rate of 1.5 psi/min after confirming normal ear equalization. He tolerated the treatment and subsequent decompression at a rate of approximately 2 psi/min. He denied any issues with ear equalization and/or pain. Joe [name] post-treatment heart rate was 50 bpm. He denied any symptoms related to bradycardia. Patient was stable upon discharge. Physician HBO Attestation: I certify that I supervised this HBO treatment in accordance with Medicare guidelines. A trained emergency response team is readily available per Yes hospital policies and procedures. Continue HBOT as ordered. Yes Electronic Signature(s) Signed: 01/28/2023 3:11:40 PM By: Geralyn Corwin DO Previous Signature: 01/28/2023 12:57:40 PM Version By: [name] CHT EMT BS , , Entered By: Geralyn Corwin on 01/28/2023 15:09:55 HBO Safety Checklist Details -------------------------------------------------------------------------------- [name] A ([id number]) [id number] Page 2 of 2 Patient Name: Date of Service: Joe [name], [place] RK A. 01/28/2023 9:30 A M Medical Record Number: [id number] Patient Account Number: [id number] Date of Birth/Sex: Treating RN: Jan 08, 1956 (67 y.o. Joe [name] Primary Care Tamitha Norell: [name] Other Clinician: [name] Referring Yaneli Keithley: Treating Rhoderick Farrel/Extender: [name] in Treatment: 22 HBO Safety Checklist Items Safety Checklist Consent Form Signed Patient voided / foley secured and emptied When did you last eato Breakfast Last dose of injectable or oral agent n/a Ostomy pouch emptied and vented if applicable NA All implantable devices assessed, documented and approved NA Intravenous access site secured and place NA Valuables secured Linens and cotton and  cotton/polyester blend (less than 51% polyester) Personal oil-based products / skin lotions / body lotions removed Wigs or hairpieces removed NA Smoking or tobacco materials removed NA Books / newspapers / magazines / loose paper removed Cologne, aftershave, perfume and deodorant removed Jewelry removed (may wrap wedding band)  Make-up removed NA Hair care products removed Battery operated devices (external) removed Heating patches and chemical warmers removed Titanium eyewear removed Nail polish cured greater than 10 hours NA Casting material cured greater than 10 hours NA Hearing aids removed NA Loose dentures or partials removed NA Prosthetics have been removed NA Patient demonstrates correct use of air break device (if applicable) Patient concerns have been addressed Patient grounding bracelet on and cord attached to chamber Specifics for Inpatients (complete in addition to above) Medication sheet sent with patient NA Intravenous medications needed or due during therapy sent with patient NA Drainage tubes (e.g. nasogastric tube or chest tube secured and vented) NA Endotracheal or Tracheotomy tube secured NA Cuff deflated of air and inflated with saline NA Airway suctioned NA Notes Paper version used prior to treatment start. Electronic Signature(s) Signed: 01/28/2023 10:36:10 AM By: [name] CHT EMT BS , , Entered By: [name] on 01/28/2023 10:36:10

## 2023-01-28 NOTE — Progress Notes (Signed)
Walt, Enrrique A ([id number]) [id number] Page 1 of 7 Visit Report for 01/28/2023 Chief Complaint Document Details Patient Name: Date of Service: Joe [name], [place] RK A. 01/28/2023 8:00 A M Medical Record Number: [id number] Patient Account Number: [id number] Date of Birth/Sex: Treating RN: 1955/11/02 (67 y.o. M) Primary Care Provider: [name] Other Clinician: Referring Provider: Treating Provider/Extender: [name] in Treatment: 22 Information Obtained from: Patient Chief Complaint 08/27/2022; patient presents for sacral wound Electronic Signature(s) Signed: 01/28/2023 11:07:03 AM By: Geralyn Corwin DO Entered By: Geralyn Corwin on 01/28/2023 09:13:40 -------------------------------------------------------------------------------- HPI Details Patient Name: Date of Service: Joe Dills, [name] 01/28/2023 8:00 A M Medical Record Number: [id number] Patient Account Number: [id number] Date of Birth/Sex: Treating RN: [birth year] (67 y.o. M) Primary Care Provider: [name] Other Clinician: Referring Provider: Treating Provider/Extender: [name] in Treatment: 22 History of Present Illness HPI Description: 08/27/2022 Mr. Xzaviar Maloof is a 67 year old male with a past medical history of chronic combined systolic diastolic heart failure, coronary artery disease status post CABG x1, left bundle branch block That presents to the clinic for a sacral ulcer. Patient was admitted to the hospital on 07/08/2022 For 6 weeks and discharged on 08/13/2022 For placement of a bioprosthetic aortic valve due to an ascending aortic aneurysm. During this admission patient states that he developed the sacral ulcer. He is not bedbound and ambulates with a walker. He is currently living with his mother and home health comes out for dressing changes. Currently he is using Medihoney every 3 days. There  is mild odor. Currently denies systemic signs of infection. 11/30; patient presents for follow-up. He has been using Dakin's wet-to-dry dressings mother is present during the encounter. He has no issues or complaints today. He denies signs of infection. 12/14; patient presents for follow-up. He has been using Dakin's wet-to-dry dressings. He has no issues or complaints today. He denies signs of infection. 12/28; patient presents for follow-up. He has been using Dakin's wet-to-dry dressings. He has no issues or complaints today. 1/11; patient presents for follow-up. He had a sacral x-ray done at last clinic visit that showed findings consistent with osteomyelitis to the mid to inferior sacrum. He is scheduled to see infectious disease on 1/15. He has been using Dakin's wet-to-dry dressings. He has chronic pain to the site. He denies systemic signs of infection. 1/25; patient presents for follow-up. He saw Dr. Earlene Plater with infectious disease on 1/15. Patient was started on a PICC line and plan is for 6 weeks of IV antibiotics. Patient has been using Dakin's wet-to-dry packing strips. 2/15; patient presents for follow-up. He is on week 3 of IV antibiotics. He has been using Dakin's wet-to-dry packing strips. He has no issues or complaints today. 3/7; patient presents for follow-up. He has completed his course of IV antibiotics. He has been using Dakin's wet-to-dry packing strip. He denies any pain to the wound site as he previously had chronic pain. He wants to hold off on doing HBO therapy. 3/14; patient presents for follow-up. We started endoform at last clinic visit. He has done well with this. We also obtained a PCR culture that did not grow bacteria. There was Candida present however this looks to be colonization. Patient has no issues or complaints today. 3/28; patient presents for follow-up. He has been using endoform to the wound bed. There is still a decent tunnel present. He has been approved  for HBO therapy. Due to his echo  showing a left ventricular ejection fraction of less than 20 he will need cardiac clearance to proceed with HBO. This was discussed with the patient and mother. They have an appointment with cardiology next week. 4/8; patient presents for follow-up. He has been using endoform to the wound bed. Per mother who changes the dressing there is still a tunnel present. It was hard to appreciate this today however there is still an open wound. He has been approved for HBO therapy. He was cleared by cardiology to proceed. He would like to start treatment. 4/18; patient presents for follow-up. He has been using antibiotic ointment to the wound bed. Wound is smaller. He continues HBO therapy without issues. Electronic Signature(s) Lejeune, Kobyn A ([id number]) [id number] Page 2 of 7 Signed: 01/28/2023 11:07:03 AM By: Geralyn Corwin DO Entered By: Geralyn Corwin on 01/28/2023 09:16:05 -------------------------------------------------------------------------------- Physical Exam Details Patient Name: Date of Service: Joe Dills, [name] 01/28/2023 8:00 A M Medical Record Number: [id number] Patient Account Number: [id number] Date of Birth/Sex: Treating RN: Dec 16, 1955 (67 y.o. M) Primary Care Provider: [name] Other Clinician: Referring Provider: Treating Provider/Extender: [name] in Treatment: 22 Constitutional respirations regular, non-labored and within target range for patient.[name] Kitchen Psychiatric pleasant and cooperative. Notes Sacral ulcer: Small open wound with granulation tissue at the opening. Electronic Signature(s) Signed: 01/28/2023 11:07:03 AM By: Geralyn Corwin DO Entered By: Geralyn Corwin on 01/28/2023 09:16:30 -------------------------------------------------------------------------------- Physician Orders Details Patient Name: Date of Service: Joe Dills, [name] 01/28/2023  8:00 A M Medical Record Number: [id number] Patient Account Number: [id number] Date of Birth/Sex: Treating RN: 10-04-1956 (67 y.o. [name] Primary Care Provider: [name] Other Clinician: Referring Provider: Treating Provider/Extender: [name] in Treatment: 22 Verbal / Phone Orders: No Diagnosis Coding Follow-up Appointments ppointment in 2 weeks. - Before or after Hyperbarics. Return A Continue hyperbarics. Off-Loading Turn and reposition every 2 hours Hyperbaric Oxygen Therapy Wound #1 Sacrum Evaluate for HBO Therapy Indication: - chronic refractory osteomyelitis If appropriate for treatment, begin HBOT per protocol: 2.5 ATA for 90 Minutes with 2 Five (5) Minute A Breaks ir Total Number of Treatments: - 40 A frin (Oxymetazoline HCL) 0.05% nasal spray - 1 spray in both nostrils daily as needed prior to HBO treatment for difficulty clearing ears Wound Treatment Wound #1 - Sacrum Cleanser: Soap and Water (Home Health) 1 x Per Day/30 Days Discharge Instructions: May shower and wash wound with dial antibacterial soap and water prior to dressing change. Cleanser: Wound Cleanser (Home Health) (Generic) 1 x Per Day/30 Days Discharge Instructions: Cleanse the wound with wound cleanser prior to applying a clean dressing using gauze sponges, not tissue or cotton balls. Peri-Wound Care: Skin Prep (Home Health) (Generic) 1 x Per Day/30 Days Discharge Instructions: Use skin prep as directed Topical: Antibiotic ointment 1 x Per Day/30 Days Discharge Instructions: apply a small amount ointment. Secondary Dressing: Woven Gauze Sponge, Non-Sterile 4x4 in (Generic) 1 x Per Day/30 Days Mignano, Brekken A ([id number]) [id number] Page 3 of 7 Discharge Instructions: Apply over primary dressing as directed. Secondary Dressing: Zetuvit Plus Silicone Border Dressing 4x4 (in/in) (Generic) 1 x Per Day/30 Days Discharge  Instructions: Apply silicone border over primary dressing as directed. Electronic Signature(s) Signed: 01/28/2023 11:07:03 AM By: Geralyn Corwin DO Entered By: Geralyn Corwin on 01/28/2023 09:16:39 -------------------------------------------------------------------------------- Problem List Details Patient Name: Date of Service: Joe Dills, [name] 01/28/2023 8:00 A M Medical Record Number: [id number] Patient Account Number: [id number] Date  of Birth/Sex: Treating RN: 02-May-1956 (67 y.o. M) Primary Care Provider: [name] Other Clinician: Referring Provider: Treating Provider/Extender: [name] in Treatment: 22 Active Problems ICD-10 Encounter Code Description Active Date MDM Diagnosis L89.154 Pressure ulcer of sacral region, stage 4 08/27/2022 No Yes I50.42 Chronic combined systolic (congestive) and diastolic (congestive) heart failure 08/27/2022 No Yes Z95.1 Presence of aortocoronary bypass graft 08/27/2022 No Yes M46.28 Osteomyelitis of vertebra, sacral and sacrococcygeal region 10/22/2022 No Yes Inactive Problems Resolved Problems Electronic Signature(s) Signed: 01/28/2023 11:07:03 AM By: Geralyn Corwin DO Entered By: Geralyn Corwin on 01/28/2023 09:13:17 -------------------------------------------------------------------------------- Progress Note Details Patient Name: Date of Service: Joe Dills, [name] 01/28/2023 8:00 A M Medical Record Number: [id number] Patient Account Number: [id number] Date of Birth/Sex: Treating RN: 10/23/1955 (67 y.o. M) Primary Care Provider: [name] Other Clinician: Referring Provider: Treating Provider/Extender: [name] in Treatment: 22 Subjective Chief Complaint Information obtained from Patient 08/27/2022; patient presents for sacral wound History of Present Illness (HPI) 08/27/2022 Leeder, Darcy A ([id number])  [id number] Page 4 of 7 Mr. Jasiel Swann is a 67 year old male with a past medical history of chronic combined systolic diastolic heart failure, coronary artery disease status post CABG x1, left bundle branch block That presents to the clinic for a sacral ulcer. Patient was admitted to the hospital on 07/08/2022 For 6 weeks and discharged on 08/13/2022 For placement of a bioprosthetic aortic valve due to an ascending aortic aneurysm. During this admission patient states that he developed the sacral ulcer. He is not bedbound and ambulates with a walker. He is currently living with his mother and home health comes out for dressing changes. Currently he is using Medihoney every 3 days. There is mild odor. Currently denies systemic signs of infection. 11/30; patient presents for follow-up. He has been using Dakin's wet-to-dry dressings mother is present during the encounter. He has no issues or complaints today. He denies signs of infection. 12/14; patient presents for follow-up. He has been using Dakin's wet-to-dry dressings. He has no issues or complaints today. He denies signs of infection. 12/28; patient presents for follow-up. He has been using Dakin's wet-to-dry dressings. He has no issues or complaints today. 1/11; patient presents for follow-up. He had a sacral x-ray done at last clinic visit that showed findings consistent with osteomyelitis to the mid to inferior sacrum. He is scheduled to see infectious disease on 1/15. He has been using Dakin's wet-to-dry dressings. He has chronic pain to the site. He denies systemic signs of infection. 1/25; patient presents for follow-up. He saw Dr. Earlene Plater with infectious disease on 1/15. Patient was started on a PICC line and plan is for 6 weeks of IV antibiotics. Patient has been using Dakin's wet-to-dry packing strips. 2/15; patient presents for follow-up. He is on week 3 of IV antibiotics. He has been using Dakin's  wet-to-dry packing strips. He has no issues or complaints today. 3/7; patient presents for follow-up. He has completed his course of IV antibiotics. He has been using Dakin's wet-to-dry packing strip. He denies any pain to the wound site as he previously had chronic pain. He wants to hold off on doing HBO therapy. 3/14; patient presents for follow-up. We started endoform at last clinic visit. He has done well with this. We also obtained a PCR culture that did not grow bacteria. There was Candida present however this looks to be colonization. Patient has no issues or complaints today. 3/28; patient presents for  follow-up. He has been using endoform to the wound bed. There is still a decent tunnel present. He has been approved for HBO therapy. Due to his echo showing a left ventricular ejection fraction of less than 20 he will need cardiac clearance to proceed with HBO. This was discussed with the patient and mother. They have an appointment with cardiology next week. 4/8; patient presents for follow-up. He has been using endoform to the wound bed. Per mother who changes the dressing there is still a tunnel present. It was hard to appreciate this today however there is still an open wound. He has been approved for HBO therapy. He was cleared by cardiology to proceed. He would like to start treatment. 4/18; patient presents for follow-up. He has been using antibiotic ointment to the wound bed. Wound is smaller. He continues HBO therapy without issues. Patient History Family History Heart Disease - Mother,Maternal Grandparents. Social History Never smoker, Alcohol Use - Never, Drug Use - No History, Caffeine Use - Never. Medical History Cardiovascular Patient has history of Coronary Artery Disease, Hypertension Musculoskeletal Patient has history of Osteoarthritis - bilateral hips; left shoulder Hospitalization/Surgery History - cardioversions 07/2022. - CABG 07/08/2022. - hip replacements 2015. -  2018 left shoulder surgery. - epicardial pacing lead placement 07/08/2022. Medical A Surgical History Notes nd Constitutional Symptoms (General Health) cognitive deficits congenital brain damage expressive speech delay Cardiovascular PVC LBBB thoracic ascending aortic aneurysm epicardial pacing lead placement Gastrointestinal umbilical hernia Genitourinary kidney stones Objective Constitutional respirations regular, non-labored and within target range for patient.. Vitals Time Taken: 8:23 AM, Height: 67 in, Weight: 160 lbs, BMI: 25.1, Temperature: 98.6 F, Pulse: 72 bpm, Respiratory Rate: 20 breaths/min, Blood Pressure: 121/71 mmHg. Psychiatric pleasant and cooperative. General Notes: Sacral ulcer: Small open wound with granulation tissue at the opening. Tomkiewicz, Lleyton A ([id number]) [id number] Page 5 of 7 Integumentary (Hair, Skin) Wound #1 status is Open. Original cause of wound was Pressure Injury. The date acquired was: 08/07/2022. The wound has been in treatment 22 weeks. The wound is located on the Sacrum. The wound measures 0.1cm length x 0.1cm width x 0.2cm depth; 0.008cm^2 area and 0.002cm^3 volume. There is no tunneling or undermining noted. There is a none present amount of drainage noted. The wound margin is distinct with the outline attached to the wound base. There is no granulation within the wound bed. There is no necrotic tissue within the wound bed. The periwound skin appearance did not exhibit: Callus, Crepitus, Excoriation, Induration, Rash, Scarring, Dry/Scaly, Maceration, Atrophie Blanche, Cyanosis, Ecchymosis, Hemosiderin Staining, Mottled, Pallor, Rubor, Erythema. Assessment Active Problems ICD-10 Pressure ulcer of sacral region, stage 4 Chronic combined systolic (congestive) and diastolic (congestive) heart failure Presence of aortocoronary bypass graft Osteomyelitis of vertebra, sacral and sacrococcygeal region Patient's wound  appears well-healing. I recommended continuing antibiotic ointment here. Continue HBO therapy. Follow-up in 2 weeks. Plan Follow-up Appointments: Return Appointment in 2 weeks. - Before or after Hyperbarics. Continue hyperbarics. Off-Loading: Turn and reposition every 2 hours Hyperbaric Oxygen Therapy: Wound #1 Sacrum: Evaluate for HBO Therapy Indication: - chronic refractory osteomyelitis If appropriate for treatment, begin HBOT per protocol: 2.5 ATA for 90 Minutes with 2 Five (5) Minute Air Breaks T Number of Treatments: - 40 otal Afrin (Oxymetazoline HCL) 0.05% nasal spray - 1 spray in both nostrils daily as needed prior to HBO treatment for difficulty clearing ears WOUND #1: - Sacrum Wound Laterality: Cleanser: Soap and Water (Home Health) 1 x Per Day/30 Days Discharge Instructions: May shower and wash  wound with dial antibacterial soap and water prior to dressing change. Cleanser: Wound Cleanser (Home Health) (Generic) 1 x Per Day/30 Days Discharge Instructions: Cleanse the wound with wound cleanser prior to applying a clean dressing using gauze sponges, not tissue or cotton balls. Peri-Wound Care: Skin Prep (Home Health) (Generic) 1 x Per Day/30 Days Discharge Instructions: Use skin prep as directed Topical: Antibiotic ointment 1 x Per Day/30 Days Discharge Instructions: apply a small amount ointment. Secondary Dressing: Woven Gauze Sponge, Non-Sterile 4x4 in (Generic) 1 x Per Day/30 Days Discharge Instructions: Apply over primary dressing as directed. Secondary Dressing: Zetuvit Plus Silicone Border Dressing 4x4 (in/in) (Generic) 1 x Per Day/30 Days Discharge Instructions: Apply silicone border over primary dressing as directed. 1. Antibiotic ointment 2. Continue HBO therapy 3. Follow-up in 2 weeks Electronic Signature(s) Signed: 01/28/2023 11:07:03 AM By: Geralyn Corwin DO Entered By: Geralyn Corwin on 01/28/2023  09:17:08 -------------------------------------------------------------------------------- HxROS Details Patient Name: Date of Service: Joe Dills, [name] 01/28/2023 8:00 A M Medical Record Number: [id number] Patient Account Number: [id number] Date of Birth/Sex: Treating RN: 1955-12-14 (67 y.o. M) Primary Care Provider: [name] Other Clinician: Referring Provider: Treating Provider/Extender: [name] in Treatment: 22 Constitutional Symptoms (General Health) Medical History: Past Medical History Notes: Dacey, Bernice A ([id number]) [id number] Page 6 of 7 cognitive deficits congenital brain damage expressive speech delay Cardiovascular Medical History: Positive for: Coronary Artery Disease; Hypertension Past Medical History Notes: PVC LBBB thoracic ascending aortic aneurysm epicardial pacing lead placement Gastrointestinal Medical History: Past Medical History Notes: umbilical hernia Genitourinary Medical History: Past Medical History Notes: kidney stones Musculoskeletal Medical History: Positive for: Osteoarthritis - bilateral hips; left shoulder Immunizations Pneumococcal Vaccine: Received Pneumococcal Vaccination: No Implantable Devices No devices added Hospitalization / Surgery History Type of Hospitalization/Surgery cardioversions 07/2022 CABG 07/08/2022 hip replacements 2015 2018 left shoulder surgery epicardial pacing lead placement 07/08/2022 Family and Social History Heart Disease: Yes - Mother,Maternal Grandparents; Never smoker; Alcohol Use: Never; Drug Use: No History; Caffeine Use: Never; Financial Concerns: No; Food, Clothing or Shelter Needs: No; Support System Lacking: No; Transportation Concerns: No Electronic Signature(s) Signed: 01/28/2023 11:07:03 AM By: Geralyn Corwin DO Entered By: Geralyn Corwin on 01/28/2023  09:16:10 -------------------------------------------------------------------------------- SuperBill Details Patient Name: Date of Service: Joe Dills, [name] 01/28/2023 Medical Record Number: [id number] Patient Account Number: [id number] Date of Birth/Sex: Treating RN: 08/02/1956 (67 y.o. [name] Primary Care Provider: [name] Other Clinician: Referring Provider: Treating Provider/Extender: [name] in Treatment: 22 Diagnosis Coding ICD-10 Codes Code Description L89.154 Pressure ulcer of sacral region, stage 4 I50.42 Chronic combined systolic (congestive) and diastolic (congestive) heart failure Z95.1 Presence of aortocoronary bypass graft Meriweather, Maddyx A ([id number]) [id number] Page 7 of 7 M46.28 Osteomyelitis of vertebra, sacral and sacrococcygeal region Facility Procedures : CPT4 Code: [id number] Description: 99213 - WOUND CARE VISIT-LEV 3 EST PT Modifier: Quantity: 1 Physician Procedures : CPT4 Code Description Modifier [id number] 99213 - WC PHYS LEVEL 3 - EST PT ICD-10 Diagnosis Description L89.154 Pressure ulcer of sacral region, stage 4 M46.28 Osteomyelitis of vertebra, sacral and sacrococcygeal region I50.42 Chronic combined systolic  (congestive) and diastolic (congestive) heart failure Z95.1 Presence of aortocoronary bypass graft Quantity: 1 Electronic Signature(s) Signed: 01/28/2023 11:07:03 AM By: Geralyn Corwin DO Entered By: Geralyn Corwin on 01/28/2023 09:18:09

## 2023-01-28 NOTE — Progress Notes (Signed)
[name], [name] [id number]) [id number] Page 1 of 1 Visit Report for 01/28/2023 SuperBill Details Patient Name: Date of Service: Joe [name], [place] RK A. 01/28/2023 Medical Record Number: [id number] Patient Account Number: [id number] Date of Birth/Sex: Treating RN: 1956/07/24 (67 y.o. [name] Primary Care Provider: [name] Other Clinician: [name] Referring Provider: Treating Provider/Extender: [name] in Treatment: 22 Diagnosis Coding ICD-10 Codes Code Description [id number] Pressure ulcer of sacral region, stage 4 I50.42 Chronic combined systolic (congestive) and diastolic (congestive) heart failure Z95.1 Presence of aortocoronary bypass graft M46.28 Osteomyelitis of vertebra, sacral and sacrococcygeal region Facility Procedures CPT4 Code Description Modifier Quantity [id number] G0277-(Facility Use Only) HBOT full body chamber, , 4 ICD-10 Diagnosis Description M46.28 Osteomyelitis of vertebra, sacral and sacrococcygeal region L89.154 Pressure ulcer of sacral region, stage 4 I50.42 Chronic combined systolic (congestive) and diastolic (congestive) heart failure Z95.1 Presence of aortocoronary bypass graft Physician Procedures Quantity CPT4 Code Description Modifier [id number] - WC PHYS HYPERBARIC OXYGEN THERAPY 1 ICD-10 Diagnosis Description M46.28 Osteomyelitis of vertebra, sacral and sacrococcygeal region L89.154 Pressure ulcer of sacral region, stage 4 I50.42 Chronic combined systolic (congestive) and diastolic (congestive) heart failure Z95.1 Presence of aortocoronary bypass graft Electronic Signature(s) Signed: 01/28/2023 12:58:02 PM By: [name] CHT EMT BS , , Signed: 01/28/2023 3:11:40 PM By: Geralyn Corwin DO Entered By: [name] on 01/28/2023 12:58:02

## 2023-01-29 ENCOUNTER — Encounter (HOSPITAL_BASED_OUTPATIENT_CLINIC_OR_DEPARTMENT_OTHER): Payer: Medicare Other | Admitting: Internal Medicine

## 2023-01-29 DIAGNOSIS — Z951 Presence of aortocoronary bypass graft: Secondary | ICD-10-CM

## 2023-01-29 DIAGNOSIS — M4628 Osteomyelitis of vertebra, sacral and sacrococcygeal region: Secondary | ICD-10-CM

## 2023-01-29 DIAGNOSIS — L89154 Pressure ulcer of sacral region, stage 4: Secondary | ICD-10-CM

## 2023-01-29 DIAGNOSIS — I5042 Chronic combined systolic (congestive) and diastolic (congestive) heart failure: Secondary | ICD-10-CM

## 2023-01-29 NOTE — Progress Notes (Signed)
Doell, Jamelle A [id number]) [id number] Page 1 of 2 Visit Report for 01/29/2023 Arrival Information Details Patient Name: Date of Service: Joe [name], [place] RK A. 01/29/2023 8:00 A M Medical Record Number: [id number] Patient Account Number: [id number] Date of Birth/Sex: Treating RN: 11-Nov-1955 (67 y.o. [name], [name] Primary Care Vickey Boak: [name] Other Clinician: [name] Referring Kavi Almquist: Treating Riniyah Speich/Extender: [name] in Treatment: 22 Visit Information History Since Last Visit All ordered tests and consults were completed: Yes Patient Arrived: Ambulatory Added or deleted any medications: No Arrival Time: 07:28 Any new allergies or adverse reactions: No Accompanied By: mother Had a fall or experienced change in No Transfer Assistance: None activities of daily living that may affect Patient Identification Verified: Yes risk of falls: Secondary Verification Process Completed: Yes Signs or symptoms of abuse/neglect since last visito No Patient Requires Transmission-Based Precautions: No Hospitalized since last visit: No Patient Has Alerts: Yes Implantable device outside of the clinic excluding No Patient Alerts: Patient on Blood Thinner cellular tissue based products placed in the center since last visit: Pain Present Now: No Electronic Signature(s) Signed: 01/29/2023 12:55:14 PM By: [name] CHT EMT BS , , Entered By: [name] on 01/29/2023 12:55:14 -------------------------------------------------------------------------------- Encounter Discharge Information Details Patient Name: Date of Service: Joe Dills, [name] 01/29/2023 8:00 A M Medical Record Number: [id number] Patient Account Number: [id number] Date of Birth/Sex: Treating RN: 1956-06-09 (67 y.o. [name] Primary Care Gabriel Conry: [name] Other Clinician: [name] Referring  Louanne Calvillo: Treating Val Farnam/Extender: [name] in Treatment: 22 Encounter Discharge Information Items Discharge Condition: Stable Ambulatory Status: Ambulatory Discharge Destination: Home Transportation: Private Auto Accompanied By: self Schedule Follow-up Appointment: No Clinical Summary of Care: Electronic Signature(s) Signed: 01/29/2023 12:59:57 PM By: [name] CHT EMT BS , , Entered By: [name] on 01/29/2023 12:59:57 -------------------------------------------------------------------------------- Vitals Details Patient Name: Date of Service: Joe Dills, [name] 01/29/2023 8:00 A M Medical Record Number: [id number] Patient Account Number: [id number] Date of Birth/Sex: Treating RN: 28-Aug-1956 (67 y.o. [name] Primary Care Xsavier Seeley: [name] Other Clinician: [name] Referring Gertrude Bucks: Treating Emelio Schneller/Extender: [name] in Treatment: 22 Vital Signs Time Taken: 07:46 Temperature (F): 97.3 Heider, Isam A ([id number]) [id number].pdf Page 2 of 2 Height (in): 67 Pulse (bpm): 54 Weight (lbs): 160 Respiratory Rate (breaths/min): 18 Body Mass Index (BMI): 25.1 Blood Pressure (mmHg): 103/62 Reference Range: 80 - 120 mg / dl Electronic Signature(s) Signed: 01/29/2023 12:55:38 PM By: [name] CHT EMT BS , , Entered By: [name] on 01/29/2023 12:55:38

## 2023-01-29 NOTE — Progress Notes (Signed)
Rieman, Gabreal A [id number]) [id number] Page 1 of 2 Visit Report for 01/29/2023 HBO Details Patient Name: Date of Service: Joe [name], [place] R[place] A. 01/29/2023 8:00 A M Medical Record Number: [id number] Patient Account Number: [id number] Date of Birth/Sex: Treating RN: August 29, 1956 (67 y.o. 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 HBO Treatment Details Treatment Number: 8 Patient Type: Outpatient Chamber Type: Monoplace Chamber Serial #: [id number] Treatment Protocol: 2.0 ATA with 90 minutes oxygen, and no air breaks Treatment Details Compression Rate Down: 1.5 psi / minute De-Compression Rate Up: 2.0 psi / minute Air breaks and breathing Decompress Decompress Compress Tx Pressure Begins Reached periods Begins Ends (leave unused spaces blank) Chamber Pressure (ATA 1 2 ------2 1 ) Clock Time (24 hr) 07:53 08:03 - - - - - - 09:33 09:43 Treatment Length: 110 (minutes) Treatment Segments: 4 Vital Signs Capillary Blood Glucose Reference Range: 80 - 120 mg / dl HBO Diabetic Blood Glucose Intervention Range: <131 mg/dl or >782 mg/dl Type: Time Vitals Blood Respiratory Capillary Blood Glucose Pulse Action Pulse: Temperature: Taken: Pressure: Rate: Glucose (mg/dl): Meter #: Oximetry (%) Taken: Pre 07:46 103/62 54 18 97.3 none per protocol Post 09:46 120/70 45 18 97.9 none per protocol Treatment Response Treatment Toleration: Well Treatment Completion Status: Treatment Completed without Adverse Event Treatment Notes Mr. Cullom arrived and prepared for treatment. His vital signs were  normal prior to treatment. After performing safety check, patient was placed in the chamber which was compressed with 100% oxygen at a rate of 1.5 psi/min after confirming normal ear equalization. He tolerated the treatment and subsequent decompression at a rate of approximately 2 psi/min. He denied any issues with ear equalization and/or pain. Mr. Testerman post-treatment heart rate was 45 bpm. He denied any symptoms related to bradycardia. Patient was stable upon discharge. Physician HBO Attestation: I certify that I supervised this HBO treatment in accordance with Medicare guidelines. A trained emergency response team is readily available per Yes hospital policies and procedures. Continue HBOT as ordered. Yes Electronic Signature(s) Signed: 02/01/2023 9:28:15 AM By: Geralyn Corwin DO Previous Signature: 01/29/2023 12:58:38 PM Version By: [name] CHT EMT BS , , Entered By: Geralyn Corwin on 02/01/2023 09:25:43 HBO Safety Checklist Details -------------------------------------------------------------------------------- [name] A ([id number]) [id number] Page 2 of 2 Patient Name: Date of Service: Joe [name], [place] R[place] A. 01/29/2023 8:00 A M Medical Record Number: [id number] Patient Account Number: [id number] Date of Birth/Sex: Treating RN: [birth year] (67 y.o. [name], [name] Primary Care Zackaria Burkey: [name] Other Clinician: [name] Referring Esraa Seres: Treating Hayk Divis/Extender: [name] in Treatment: 22 HBO Safety Checklist Items Safety Checklist Consent Form Signed Patient voided / foley secured and emptied When did you last eato Breakfast Last dose of injectable or oral agent n/a Ostomy pouch emptied and vented if applicable NA All implantable devices assessed, documented and approved NA Intravenous access site secured and place NA Valuables secured Linens and cotton and  cotton/polyester blend (less than 51% polyester) Personal oil-based products / skin lotions / body lotions removed Wigs or hairpieces removed NA Smoking or tobacco materials removed NA Books / newspapers / magazines / loose paper removed Cologne, aftershave, perfume and deodorant removed Jewelry removed (may wrap wedding band)  Make-up removed NA Hair care products removed Battery operated devices (external) removed Heating patches and chemical warmers removed Titanium eyewear removed Nail polish cured greater than 10 hours NA Casting material cured greater than 10 hours NA Hearing aids removed NA Loose dentures or partials removed NA Prosthetics have been removed NA Patient demonstrates correct use of air break device (if applicable) Patient concerns have been addressed Patient grounding bracelet on and cord attached to chamber Specifics for Inpatients (complete in addition to above) Medication sheet sent with patient NA Intravenous medications needed or due during therapy sent with patient NA Drainage tubes (e.g. nasogastric tube or chest tube secured and vented) NA Endotracheal or Tracheotomy tube secured NA Cuff deflated of air and inflated with saline NA Airway suctioned NA Notes Paper version used prior to treatment. Electronic Signature(s) Signed: 01/29/2023 12:56:38 PM By: [name] CHT EMT BS , , Entered By: [name] on 01/29/2023 12:56:38

## 2023-02-01 ENCOUNTER — Ambulatory Visit (HOSPITAL_BASED_OUTPATIENT_CLINIC_OR_DEPARTMENT_OTHER): Payer: Medicare Other | Admitting: Internal Medicine

## 2023-02-01 ENCOUNTER — Encounter (HOSPITAL_BASED_OUTPATIENT_CLINIC_OR_DEPARTMENT_OTHER): Payer: Medicare Other | Admitting: Internal Medicine

## 2023-02-01 DIAGNOSIS — Z951 Presence of aortocoronary bypass graft: Secondary | ICD-10-CM | POA: Diagnosis not present

## 2023-02-01 DIAGNOSIS — I5042 Chronic combined systolic (congestive) and diastolic (congestive) heart failure: Secondary | ICD-10-CM

## 2023-02-01 DIAGNOSIS — M4628 Osteomyelitis of vertebra, sacral and sacrococcygeal region: Secondary | ICD-10-CM

## 2023-02-01 DIAGNOSIS — L89154 Pressure ulcer of sacral region, stage 4: Secondary | ICD-10-CM | POA: Diagnosis not present

## 2023-02-01 NOTE — Progress Notes (Signed)
[name], [name] [id number]) [phone].pdf Page 1 of 2 Visit Report for 02/01/2023 Arrival Information Details Patient Name: Date of Service: Joe [name], [place] RK A. 02/01/2023 8:00 A M Medical Record Number: [id number] Patient Account Number: [id number] Date of Birth/Sex: Treating RN: 25-Nov-1955 (67 y.o. Joe [name], [name] Primary Care Kailee Essman: [name] Other Clinician: [name] Referring Violet Cart: Treating Madeliene Tejera/Extender: [name] in Treatment: 22 Visit Information History Since Last Visit All ordered tests and consults were completed: Yes Patient Arrived: Ambulatory Added or deleted any medications: No Arrival Time: 07:26 Any new allergies or adverse reactions: No Accompanied By: mother Had a fall or experienced change in No Transfer Assistance: None activities of daily living that may affect Patient Identification Verified: Yes risk of falls: Secondary Verification Process Completed: Yes Signs or symptoms of abuse/neglect since last visito No Patient Requires Transmission-Based Precautions: No Hospitalized since last visit: No Patient Has Alerts: Yes Implantable device outside of the clinic excluding No Patient Alerts: Patient on Blood Thinner cellular tissue based products placed in the center since last visit: Pain Present Now: No Electronic Signature(s) Signed: 02/01/2023 10:39:30 AM By: [name] CHT EMT BS , , Entered By: [name] on 02/01/2023 10:39:29 -------------------------------------------------------------------------------- Encounter Discharge Information Details Patient Name: Date of Service: Joe Dills, [name] 02/01/2023 8:00 A M Medical Record Number: [id number] Patient Account Number: [id number] Date of Birth/Sex: Treating RN: 01-Mar-1956 (67 y.o. Joe [name] Primary Care Claud Gowan: [name] Other Clinician: [name] Referring  Maguire Killmer: Treating Keion Neels/Extender: [name] in Treatment: 22 Encounter Discharge Information Items Discharge Condition: Stable Ambulatory Status: Ambulatory Discharge Destination: Home Transportation: Private Auto Accompanied By: self Schedule Follow-up Appointment: No Clinical Summary of Care: Electronic Signature(s) Signed: 02/01/2023 10:51:07 AM By: [name] CHT EMT BS , , Entered By: [name] on 02/01/2023 10:51:07 -------------------------------------------------------------------------------- Vitals Details Patient Name: Date of Service: Joe Dills, [name] 02/01/2023 8:00 A M Medical Record Number: [id number] Patient Account Number: [id number] Date of Birth/Sex: Treating RN: [birth year] (67 y.o. Joe [name] Primary Care Coolidge Gossard: [name] Other Clinician: [name] Referring Laval Cafaro: Treating Saharsh Sterling/Extender: [name] in Treatment: 22 Vital Signs Time Taken: 07:41 Temperature (F): 98.2 [name], [name] ([id number]) [phone].pdf Page 2 of 2 Height (in): 67 Pulse (bpm): 51 Weight (lbs): 160 Respiratory Rate (breaths/min): 18 Body Mass Index (BMI): 25.1 Blood Pressure (mmHg): 115/67 Reference Range: 80 - 120 mg / dl Electronic Signature(s) Signed: 02/01/2023 10:40:07 AM By: [name] CHT EMT BS , , Entered By: [name] on 02/01/2023 10:40:07

## 2023-02-01 NOTE — Progress Notes (Signed)
Bukowski, Diante A [id number]) [phone].pdf Page 1 of 1 Visit Report for 02/01/2023 SuperBill Details Patient Name: Date of Service: Joe [name], Joe [name] RK A. 02/01/2023 Medical Record Number: [id number] Patient Account Number: [id number] Date of Birth/Sex: Treating RN: 1955-11-14 (67 y.o. [name], [name] Primary Care Provider: [name] Other Clinician: [name] Referring Provider: Treating Provider/Extender: [name] in Treatment: 22 Diagnosis Coding ICD-10 Codes Code Description [phone] Pressure ulcer of sacral region, stage 4 I50.42 Chronic combined systolic (congestive) and diastolic (congestive) heart failure Z95.1 Presence of aortocoronary bypass graft M46.28 Osteomyelitis of vertebra, sacral and sacrococcygeal region Facility Procedures CPT4 Code Description Modifier Quantity [id number] G0277-(Facility Use Only) HBOT full body chamber, , 4 ICD-10 Diagnosis Description M46.28 Osteomyelitis of vertebra, sacral and sacrococcygeal region L89.154 Pressure ulcer of sacral region, stage 4 I50.42 Chronic combined systolic (congestive) and diastolic (congestive) heart failure Z95.1 Presence of aortocoronary bypass graft Physician Procedures Quantity CPT4 Code Description Modifier [id number] - WC PHYS HYPERBARIC OXYGEN THERAPY 1 ICD-10 Diagnosis Description M46.28 Osteomyelitis of vertebra, sacral and sacrococcygeal region L89.154 Pressure ulcer of sacral region, stage 4 I50.42 Chronic combined systolic (congestive) and diastolic (congestive) heart failure Z95.1 Presence of aortocoronary bypass graft Electronic Signature(s) Signed: 02/01/2023 10:50:39 AM By: [name] CHT EMT BS , , Signed: 02/01/2023 3:35:10 PM By: Geralyn Corwin DO Entered By: [name] on 02/01/2023 10:50:38

## 2023-02-01 NOTE — Progress Notes (Signed)
Houpt, Kaelem A ([id number]) [id number] Page 1 of 1 Visit Report for 01/29/2023 SuperBill Details Patient Name: Date of Service: Joe [name], [place] RK A. 01/29/2023 Medical Record Number: [id number] Patient Account Number: [id number] Date of Birth/Sex: Treating RN: August 07, 1956 (67 y.o. [name], [name] Primary Care Provider: [name] Other Clinician: [name] Referring Provider: Treating Provider/Extender: [name] in Treatment: 22 Diagnosis Coding ICD-10 Codes Code Description [phone] Pressure ulcer of sacral region, stage 4 I50.42 Chronic combined systolic (congestive) and diastolic (congestive) heart failure Z95.1 Presence of aortocoronary bypass graft M46.28 Osteomyelitis of vertebra, sacral and sacrococcygeal region Facility Procedures CPT4 Code Description Modifier Quantity [id number] G0277-(Facility Use Only) HBOT full body chamber, , 4 ICD-10 Diagnosis Description M46.28 Osteomyelitis of vertebra, sacral and sacrococcygeal region L89.154 Pressure ulcer of sacral region, stage 4 I50.42 Chronic combined systolic (congestive) and diastolic (congestive) heart failure Z95.1 Presence of aortocoronary bypass graft Physician Procedures Quantity CPT4 Code Description Modifier [id number] 99183 - WC PHYS HYPERBARIC OXYGEN THERAPY 1 ICD-10 Diagnosis Description M46.28 Osteomyelitis of vertebra, sacral and sacrococcygeal region L89.154 Pressure ulcer of sacral region, stage 4 I50.42 Chronic combined systolic (congestive) and diastolic (congestive) heart failure Z95.1 Presence of aortocoronary bypass graft Electronic Signature(s) Signed: 01/29/2023 12:59:02 PM By: [name] CHT EMT BS , , Signed: 02/01/2023 9:28:15 AM By: Geralyn Corwin DO Entered By: [name] on 01/29/2023 12:59:01

## 2023-02-01 NOTE — Progress Notes (Addendum)
Joe [name] [id number]) [id number] Page 1 of 2 Visit Report for 02/01/2023 HBO Details Patient Name: Date of Service: Joe [name], [place] [name]. 02/01/2023 8:00 [name] Medical Record Number: [id number] Patient Account Number: [id number] Date of Birth/Sex: Treating RN: [birth year] (67 y.o. 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 HBO Treatment Details Treatment Number: 9 Patient Type: Outpatient Chamber Type: Monoplace Chamber Serial #: [id number] Treatment Protocol: 2.0 ATA with 90 minutes oxygen, and no air breaks Treatment Details Compression Rate Down: 1.5 psi / minute De-Compression Rate Up: 2.0 psi / minute Air breaks and breathing Decompress Decompress Compress Tx Pressure Begins Reached periods Begins Ends (leave unused spaces blank) Chamber Pressure (ATA 1 2 ------2 1 ) Clock Time (24 hr) 08:00 08:10 - - - - - - 09:40 09:50 Treatment Length: 110 (minutes) Treatment Segments: 4 Vital Signs Capillary Blood Glucose Reference Range: 80 - 120 mg / dl HBO Diabetic Blood Glucose Intervention Range: <131 mg/dl or >742 mg/dl Type: Time Vitals Blood Respiratory Capillary Blood Glucose Pulse Action Pulse: Temperature: Taken: Pressure: Rate: Glucose (mg/dl): Meter #: Oximetry (%) Taken: Pre 07:41 115/67 51 18 98.2 Weight :168.5 lbs Post 09:54 112/71 48 18 97.9 Weight: 168.25 lbs Treatment Response Treatment Toleration: Well Treatment Completion Status: Treatment Completed without Adverse Event Treatment Notes [name] arrived and prepared for treatment. His vital signs were  normal with the exception of heart rate of 51 bpm. [name] denied any symptoms related to bradycardia. After performing safety check, patient was placed in the chamber which was compressed with 100% oxygen at [name] rate of 1.5 psi/min after confirming normal ear equalization. He tolerated the treatment and subsequent decompression at [name] rate of approximately 1.5 psi/min. He denied any issues with ear equalization and/or pain. [name] post-treatment heart rate was 48 bpm. Again, he denied any symptoms related to bradycardia. Patient was stable upon discharge. Physician HBO Attestation: I certify that I supervised this HBO treatment in accordance with Medicare guidelines. [name] trained emergency response team is readily available per Yes hospital policies and procedures. Continue HBOT as ordered. Yes Electronic Signature(s) Signed: 02/01/2023 3:35:10 PM By: Joe Corwin DO Previous Signature: 02/01/2023 10:49:53 AM Version By: Joe [name] CHT EMT BS , , Entered By: Joe [name] on 02/01/2023 15:34:51 Fan, Cane [name] ([id number]) [id number] Page 2 of 2 -------------------------------------------------------------------------------- HBO Safety Checklist Details Patient Name: Date of Service: Joe [name], [place] [name]. 02/01/2023 8:00 [name] Medical Record Number: [id number] Patient Account Number: [id number] Date of Birth/Sex: Treating RN: 1956/02/16 (67 y.o. Joe [name], Joe [name] Primary Care Aika Brzoska: [name] Other Clinician: Haywood [name] Referring Joe [name]: Treating Joe [name]/Extender: Joe [name] in Treatment: 22 HBO Safety Checklist Items Safety Checklist Consent Form Signed Patient voided / foley secured and emptied When did you last eato Breakfast Last dose of injectable or oral agent n/[name] Ostomy pouch emptied and vented if applicable NA All implantable devices assessed, documented and approved NA Intravenous  access site secured and place NA Valuables secured Linens and cotton and cotton/polyester blend (less than 51% polyester) Personal oil-based products / skin lotions / body lotions removed Wigs or hairpieces removed NA Smoking or tobacco materials removed NA Books / newspapers / magazines /  loose paper removed Cologne, aftershave, perfume and deodorant removed Jewelry removed (may wrap wedding band) Make-up removed NA Hair care products removed Battery operated devices (external) removed Heating patches and chemical warmers removed Titanium eyewear removed Nail polish cured greater than 10 hours NA Casting material cured greater than 10 hours NA Hearing aids removed NA Loose dentures or partials removed NA Prosthetics have been removed NA Patient demonstrates correct use of air break device (if applicable) Patient concerns have been addressed Patient grounding bracelet on and cord attached to chamber Specifics for Inpatients (complete in addition to above) Medication sheet sent with patient NA Intravenous medications needed or due during therapy sent with patient NA Drainage tubes (e.g. nasogastric tube or chest tube secured and vented) NA Endotracheal or Tracheotomy tube secured NA Cuff deflated of air and inflated with saline NA Airway suctioned NA Notes Paper version used prior to treatment. Electronic Signature(s) Signed: 02/01/2023 10:45:24 AM By: Joe [name] CHT EMT BS , , Entered By: Joe [name] on 02/01/2023 10:45:23

## 2023-02-02 ENCOUNTER — Encounter (HOSPITAL_BASED_OUTPATIENT_CLINIC_OR_DEPARTMENT_OTHER): Payer: Medicare Other | Admitting: Internal Medicine

## 2023-02-02 DIAGNOSIS — M4628 Osteomyelitis of vertebra, sacral and sacrococcygeal region: Secondary | ICD-10-CM | POA: Diagnosis not present

## 2023-02-02 DIAGNOSIS — L89154 Pressure ulcer of sacral region, stage 4: Secondary | ICD-10-CM

## 2023-02-02 DIAGNOSIS — Z951 Presence of aortocoronary bypass graft: Secondary | ICD-10-CM | POA: Diagnosis not present

## 2023-02-02 DIAGNOSIS — I5042 Chronic combined systolic (congestive) and diastolic (congestive) heart failure: Secondary | ICD-10-CM

## 2023-02-02 NOTE — Progress Notes (Signed)
Colgan, Montravious A [id number]) [phone].pdf Page 1 of 2 Visit Report for 02/02/2023 Arrival Information Details Patient Name: Date of Service: Joe [name], [place] RK A. 02/02/2023 8:00 A M Medical Record Number: [id number] Patient Account Number: [id number] Date of Birth/Sex: Treating RN: 10/10/1956 (67 y.o. [name] Primary Care Bellamie Turney: [name] Other Clinician: [name] Referring Rickard Kennerly: Treating Henrietta Cieslewicz/Extender: [name] in Treatment: 22 Visit Information History Since Last Visit All ordered tests and consults were completed: Yes Patient Arrived: Ambulatory Added or deleted any medications: No Arrival Time: 07:27 Any new allergies or adverse reactions: No Accompanied By: self Had a fall or experienced change in No Transfer Assistance: None activities of daily living that may affect Patient Identification Verified: Yes risk of falls: Secondary Verification Process Completed: Yes Signs or symptoms of abuse/neglect since last visito No Patient Requires Transmission-Based Precautions: No Hospitalized since last visit: No Patient Has Alerts: Yes Implantable device outside of the clinic excluding No Patient Alerts: Patient on Blood Thinner cellular tissue based products placed in the center since last visit: Pain Present Now: No Electronic Signature(s) Signed: 02/02/2023 8:29:46 AM By: [name] CHT EMT BS , , Previous Signature: 02/02/2023 8:28:59 AM Version By: [name] CHT EMT BS , , Entered By: [name] on 02/02/2023 08:29:46 -------------------------------------------------------------------------------- Encounter Discharge Information Details Patient Name: Date of Service: Joe Dills, [name] 02/02/2023 8:00 A M Medical Record Number: [id number] Patient Account Number: [id number] Date of Birth/Sex: Treating RN: [birth year] (67 y.o. [name] Primary Care  Cornellius Kropp: [name] Other Clinician: Karl Bales Referring Masako Overall: Treating Luciann Gossett/Extender: [name] in Treatment: 22 Encounter Discharge Information Items Discharge Condition: Stable Ambulatory Status: Ambulatory Discharge Destination: Home Transportation: Private Auto Accompanied By: Mother Schedule Follow-up Appointment: Yes Clinical Summary of Care: Electronic Signature(s) Signed: 02/02/2023 10:40:15 AM By: Karl Bales EMT Entered By: Karl Bales on 02/02/2023 10:40:15 -------------------------------------------------------------------------------- Vitals Details Patient Name: Date of Service: Joe Dills, [name] 02/02/2023 8:00 A M Medical Record Number: [id number] Patient Account Number: [id number] Date of Birth/Sex: Treating RN: Jun 10, 1956 (67 y.o. [name] Primary Care Olliver Boyadjian: [name] Other Clinician: [name] Referring Brisa Auth: Treating Raechel Marcos/Extender: [name] in Treatment: 22 Vital Signs Time Taken: 07:44 Temperature (F): 97.2 Furno, Chaden A ([id number]) [phone].pdf Page 2 of 2 Height (in): 67 Pulse (bpm): 52 Weight (lbs): 160 Respiratory Rate (breaths/min): 18 Body Mass Index (BMI): 25.1 Blood Pressure (mmHg): 103/72 Reference Range: 80 - 120 mg / dl Electronic Signature(s) Signed: 02/02/2023 8:29:54 AM By: [name] CHT EMT BS , , Previous Signature: 02/02/2023 8:29:32 AM Version By: [name] CHT EMT BS , , Entered By: [name] on 02/02/2023 08:29:54

## 2023-02-02 NOTE — Progress Notes (Signed)
Renda, Gunter A [phone]) [id number] Page 1 of 2 Visit Report for 02/02/2023 HBO Details Patient Name: Date of Service: Joe [name], [place] RK A. 02/02/2023 8:00 A M Medical Record Number: [id number] Patient Account Number: [id number] Date of Birth/Sex: Treating RN: [birth year] (67 y.o. 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 HBO Treatment Details Treatment Number: 10 Patient Type: Outpatient Chamber Type: Monoplace Chamber Serial #: [id number] Treatment Protocol: 2.0 ATA with 90 minutes oxygen, and no air breaks Treatment Details Compression Rate Down: 1.0 psi / minute De-Compression Rate Up: 1.0 psi / minute Air breaks and breathing Decompress Decompress Compress Tx Pressure Begins Reached periods Begins Ends (leave unused spaces blank) Chamber Pressure (ATA 1 2 ------2 1 ) Clock Time (24 hr) 07:53 08:06 - - - - - - 09:36 09:52 Treatment Length: 119 (minutes) Treatment Segments: 4 Vital Signs Capillary Blood Glucose Reference Range: 80 - 120 mg / dl HBO Diabetic Blood Glucose Intervention Range: <131 mg/dl or >102 mg/dl Type: Time Vitals Blood Respiratory Capillary Blood Glucose Pulse Action Pulse: Temperature: Taken: Pressure: Rate: Glucose (mg/dl): Meter #: Oximetry (%) Taken: Pre 07:44 103/72 52 18 97.2 none per protocol Post 09:56 112/77 50 18 97.5 Post 166 1/4 LBS. Treatment Response Treatment Toleration: Well Treatment Completion Status: Treatment Completed without Adverse Event Treatment Notes Mr. Nesser arrived and prepared for treatment. His vital signs were  normal with the exception of heart rate of 52 bpm. Mr. Birdwell denied any symptoms related to bradycardia. After performing safety check, patient was placed in the chamber which was compressed with 100% oxygen at a rate of 1 psi/min after confirming normal ear equalization. Electronic Signature(s) Signed: 02/02/2023 10:38:12 AM By: Karl Bales EMT Previous Signature: 02/02/2023 10:37:48 AM Version By: Karl Bales EMT Previous Signature: 02/02/2023 10:37:14 AM Version By: Karl Bales EMT Previous Signature: 02/02/2023 8:33:52 AM Version By: [name] CHT EMT BS , , Entered By: Karl Bales on 02/02/2023 10:38:12 -------------------------------------------------------------------------------- HBO Safety Checklist Details Patient Name: Date of Service: Joe Dills, [name] 02/02/2023 8:00 A M Medical Record Number: [id number] Patient Account Number: [id number] Date of Birth/Sex: Treating RN: 16-Oct-1955 (67 y.o. Joe [name] Primary Care Omar Orrego: [name] Other Clinician: Karl Bales Referring Yisroel Mullendore: Treating Breylen Agyeman/Extender: Deirdre Peer Genson, Khalee A ([id number]) [id number] Page 2 of 2 Weeks in Treatment: 22 HBO Safety Checklist Items Safety Checklist Consent Form Signed Patient voided / foley secured and emptied When did you last eato Breakfast Last dose of injectable or oral agent n/a Ostomy pouch emptied and vented if applicable NA All implantable devices assessed, documented and approved NA Intravenous access site secured and place NA Valuables secured Linens and cotton and cotton/polyester blend (less than 51% polyester) Personal oil-based products / skin lotions / body lotions removed Wigs or hairpieces removed NA Smoking or tobacco materials removed NA Books / newspapers / magazines / loose paper removed Cologne, aftershave, perfume and deodorant removed Jewelry removed (may wrap  wedding band) Make-up removed NA Hair care products removed Battery operated devices (external) removed Heating patches and chemical warmers removed Titanium eyewear removed Nail polish cured greater than 10 hours NA Casting material cured greater than 10 hours NA Hearing aids removed NA Loose dentures  or partials removed NA Prosthetics have been removed NA Patient demonstrates correct use of air break device (if applicable) Patient concerns have been addressed Patient grounding bracelet on and cord attached to chamber Specifics for Inpatients (complete in addition to above) Medication sheet sent with patient NA Intravenous medications needed or due during therapy sent with patient NA Drainage tubes (e.g. nasogastric tube or chest tube secured and vented) NA Endotracheal or Tracheotomy tube secured NA Cuff deflated of air and inflated with saline NA Airway suctioned NA Notes Paper version used prior to treatment. Electronic Signature(s) Signed: 02/02/2023 8:31:23 AM By: [name] CHT EMT BS , , Entered By: [name] on 02/02/2023 08:31:23

## 2023-02-03 ENCOUNTER — Encounter (HOSPITAL_BASED_OUTPATIENT_CLINIC_OR_DEPARTMENT_OTHER): Payer: Medicare Other | Admitting: Physician Assistant

## 2023-02-03 DIAGNOSIS — M4628 Osteomyelitis of vertebra, sacral and sacrococcygeal region: Secondary | ICD-10-CM | POA: Diagnosis not present

## 2023-02-03 NOTE — Progress Notes (Signed)
Tenny, Garek A [phone] [id number]) [id number] Page 1 of 2 Visit Report for 02/03/2023 HBO Details Patient Name: Date of Service: [name], [place] RK A. 02/03/2023 8:00 A M Medical Record Number: [id number] Patient Account Number: [id number] Date of Birth/Sex: Treating RN: 31-May-1956 (67 y.o. [name] Primary Care Salim Forero: [name] Other Clinician: [name] Referring Raylee Strehl: Treating Nyrah Demos/Extender: Valla Leaver in Treatment: 22 HBO Treatment Course Details Treatment Course Number: 1 Ordering Kenzo Ozment: Geralyn Corwin T Treatments Ordered: otal 40 HBO Treatment Start Date: 01/20/2023 HBO Indication: Chronic Refractory Osteomyelitis to Sacrum HBO Treatment Details Treatment Number: 11 Patient Type: Outpatient Chamber Type: Monoplace Chamber Serial #: [id number] Treatment Protocol: 2.0 ATA with 90 minutes oxygen, and no air breaks Treatment Details Compression Rate Down: 1.5 psi / minute De-Compression Rate Up: 1.0 psi / minute Air breaks and breathing Decompress Decompress Compress Tx Pressure Begins Reached periods Begins Ends (leave unused spaces blank) Chamber Pressure (ATA 1 2 ------2 1 ) Clock Time (24 hr) 07:48 08:01 - - - - - - 09:31 09:49 Treatment Length: 121 (minutes) Treatment Segments: 4 Vital Signs Capillary Blood Glucose Reference Range: 80 - 120 mg / dl HBO Diabetic Blood Glucose Intervention Range: <131 mg/dl or >324 mg/dl Type: Time Vitals Blood Pulse: Respiratory Temperature: Capillary Blood Glucose Pulse Action Taken: Pressure: Rate: Glucose (mg/dl): Meter #: Oximetry (%) Taken: Pre 07:43 101/62 51 18 97.9 asymptomatic for bradycardia Post 09:55 110/72 53 18 97.9 asymptomatic for bradycardia Treatment Response Treatment Toleration: Well Treatment Completion Status: Treatment Completed without Adverse Event Treatment Notes [name] arrived and prepared for treatment.  His vital signs were normal with the exception of heart rate of 51 bpm. [name] denied any symptoms related to bradycardia. After performing safety check, patient was placed in the chamber which was compressed with 100% oxygen at a rate of 1 psi/min after confirming normal ear equalization. He tolerated treatment and subsequent decompression of the chamber at 1 psi/min. He denied any issues with ear equalization and/or pain. His post treatment vital signs were normal with the exception of heart rate of 53 bpm. He denied any symptoms related to bradycardia. [name] was stable upon discharge. Electronic Signature(s) Signed: 02/03/2023 11:53:06 AM By: [name] CHT EMT BS , , Signed: 02/03/2023 5:22:52 PM By: Allen Derry PA-C Previous Signature: 02/03/2023 11:12:18 AM Version By: [name] CHT EMT BS , , Previous Signature: 02/03/2023 11:09:32 AM Version By: [name] CHT EMT BS , , Previous Signature: 02/03/2023 9:06:09 AM Version By: [name] CHT EMT BS , , Entered By: [name] on 02/03/2023 11:53:06 -------------------------------------------------------------------------------- HBO Safety Checklist Details Patient Name: Date of Service: Joe Dills, [name] 02/03/2023 8:00 A M Medical Record Number: [id number] Patient Account Number: [id number] Date of Birth/Sex: Treating RN: 1956/06/14 (67 y.o. Staci Acosta, Harriett Rush, Estefano A ([id number]) [id number] Page 2 of 2 Primary Care Damira Kem: [name] Other Clinician: [name] Referring Madisynn Plair: Treating Marinda Tyer/Extender: Valla Leaver in Treatment: 22 HBO Safety Checklist Items Safety Checklist Consent Form Signed Patient voided / foley secured and emptied When did you last eato Breakfast Last dose of injectable or oral agent n/a Ostomy pouch emptied and vented if applicable NA All implantable devices assessed,  documented and approved NA Intravenous access site secured and place NA Valuables secured Linens and cotton and cotton/polyester blend (less than 51% polyester) Personal oil-based products / skin lotions / body lotions removed Wigs or hairpieces removed  NA Smoking or tobacco materials removed NA Books / newspapers / magazines / loose paper removed NA Cologne, aftershave, perfume and deodorant removed Jewelry removed (may wrap wedding band) Make-up removed NA Hair care products removed Battery operated devices (external) removed Heating patches and chemical warmers removed Titanium eyewear removed Nail polish cured greater than 10 hours NA Casting material cured greater than 10 hours NA Hearing aids removed NA Loose dentures or partials removed NA Prosthetics have been removed NA Patient demonstrates correct use of air break device (if applicable) Patient concerns have been addressed Patient grounding bracelet on and cord attached to chamber Specifics for Inpatients (complete in addition to above) Medication sheet sent with patient NA Intravenous medications needed or due during therapy sent with patient NA Drainage tubes (e.g. nasogastric tube or chest tube secured and vented) NA Endotracheal or Tracheotomy tube secured NA Cuff deflated of air and inflated with saline NA Airway suctioned NA Notes Paper version used prior to treatment. Electronic Signature(s) Signed: 02/03/2023 8:49:07 AM By: [name] CHT EMT BS , , Previous Signature: 02/03/2023 8:38:43 AM Version By: [name] CHT EMT BS , , Entered By: [name] on 02/03/2023 08:49:07

## 2023-02-03 NOTE — Progress Notes (Signed)
Joe [name] [id number]) [phone].pdf Page 1 of 2 Visit Report for 02/03/2023 Arrival Information Details Patient Name: Date of Service: Joe [name], Joe [name]. 02/03/2023 8:00 [name] Medical Record Number: [id number] Patient Account Number: [id number] Date of Birth/Sex: Treating RN: October 22, 1955 (67 y.o. Joe [name] Primary Care Joe [name]: Joe [name] Other Clinician: Haywood [name] Referring Joe [name]: Treating Joe [name]/Extender: Joe [name] in [name]: 22 Visit Information History Since Last Visit All ordered tests and consults were completed: Yes Patient Arrived: Ambulatory Added or deleted any medications: No Arrival Time: 07:30 Any new allergies or adverse reactions: No Accompanied By: mother Had [name] fall or experienced change in No Transfer Assistance: None activities of daily living that may affect Patient Identification Verified: Yes risk of falls: Secondary Verification Process Completed: Yes Signs or symptoms of abuse/neglect since last visito No Patient Requires Transmission-Based Precautions: No Hospitalized since last visit: No Patient Has Alerts: Yes Implantable device outside of the clinic excluding No Patient Alerts: Patient on Blood Thinner cellular tissue based products placed in the center since last visit: Pain Present Now: No Electronic Signature(s) Signed: 02/03/2023 8:37:14 AM By: Joe [name] CHT EMT BS , , Entered By: Joe [name] on 02/03/2023 08:37:14 -------------------------------------------------------------------------------- Encounter Discharge Information Details Patient Name: Date of Service: Joe Dills, Joe [name]. 02/03/2023 8:00 [name] Medical Record Number: [id number] Patient Account Number: [id number] Date of Birth/Sex: Treating RN: 1955-11-24 (67 y.o. Joe [name] Primary Care Marsi Turvey: Joe [name] Other Clinician: Haywood [name] Referring  Joe [name]: Treating Joe [name]/Extender: Joe [name] in [name]: 22 Encounter Discharge Information Items Discharge Condition: Stable Ambulatory Status: Ambulatory Discharge Destination: Home Transportation: Private Auto Accompanied By: mother Schedule Follow-up Appointment: No Clinical Summary of Care: Electronic Signature(s) Signed: 02/03/2023 11:11:17 AM By: Joe [name] CHT EMT BS , , Entered By: Joe [name] on 02/03/2023 11:11:17 -------------------------------------------------------------------------------- Vitals Details Patient Name: Date of Service: Joe Dills, Joe [name]. 02/03/2023 8:00 [name] Medical Record Number: [id number] Patient Account Number: [id number] Date of Birth/Sex: Treating RN: [birth year] (67 y.o. Joe [name] Primary Care Torra Pala: Joe [name] Other Clinician: Haywood [name] Referring Jasher Barkan: Treating Joe [name]/Extender: Joe [name]: 22 Vital Signs Time Taken: 07:43 Temperature (F): 97.9 Joe [name] ([id number]) [phone].pdf Page 2 of 2 Height (in): 67 Pulse (bpm): 51 Weight (lbs): 160 Respiratory Rate (breaths/min): 18 Body Mass Index (BMI): 25.1 Blood Pressure (mmHg): 101/62 Reference Range: 80 - 120 mg / dl Electronic Signature(s) Signed: 02/03/2023 8:45:30 AM By: Joe [name] CHT EMT BS , , Entered By: Joe [name] on 02/03/2023 08:45:30

## 2023-02-03 NOTE — Progress Notes (Signed)
Falotico, Momin A [phone]) [phone].pdf Page 1 of 2 Visit Report for 02/02/2023 Problem List Details Patient Name: Date of Service: Joe [name], [place] RK A. 02/02/2023 8:00 A M Medical Record Number: [id number] Patient Account Number: [id number] Date of Birth/Sex: Treating RN: [birth year] (67 y.o. [name] Primary Care Provider: [name] Other Clinician: Karl Bales Referring Provider: Treating Provider/Extender: [name] in Treatment: 22 Active Problems ICD-10 Encounter Code Description Active Date MDM Diagnosis L89.154 Pressure ulcer of sacral region, stage 4 08/27/2022 No Yes I50.42 Chronic combined systolic (congestive) and diastolic 08/27/2022 No Yes (congestive) heart failure Z95.1 Presence of aortocoronary bypass graft 08/27/2022 No Yes M46.28 Osteomyelitis of vertebra, sacral and sacrococcygeal region 10/22/2022 No Yes Inactive Problems Resolved Problems Electronic Signature(s) Signed: 02/02/2023 10:39:43 AM By: Karl Bales EMT Signed: 02/02/2023 3:44:57 PM By: Geralyn Corwin DO Entered By: Karl Bales on 02/02/2023 10:39:42 -------------------------------------------------------------------------------- SuperBill Details Patient Name: Date of Service: Joe Dills, [name] 02/02/2023 Medical Record Number: [id number] Patient Account Number: [id number] Date of Birth/Sex: Treating RN: 12-Nov-1955 (67 y.o. [name] Primary Care Provider: [name] Other Clinician: Karl Bales Referring Provider: Treating Provider/Extender: [name] in Treatment: 22 Diagnosis Coding ICD-10 Codes Code Description L89.154 Pressure ulcer of sacral region, stage 4 I50.42 Chronic combined systolic (congestive) and diastolic (congestive) heart failure Z95.1 Presence of aortocoronary bypass graft M46.28 Osteomyelitis of vertebra, sacral and sacrococcygeal  region Facility Procedures : [place], CPT4 Code Description: [id number] G0277-(Facility Use Only) HBOT full body chamber, , ICD-10 Diagnosis Description Rashed A ([id number]) M46.28 Osteomyelitis of vertebra, sacral and sacrococcygeal region L89.154 Pressure ulcer of sacral region, stage 4  I50.42 Chronic combined systolic (congestive) and diastolic (congestive Z95.1 Presence of aortocoronary bypass graft Modifier: [id number] ) heart failur Quantity: 4 [id number] Page 2 of 2 e Physician Procedures : CPT4 Code Description Modifier [id number] [phone] - WC PHYS HYPERBARIC OXYGEN THERAPY ICD-10 Diagnosis Description M46.28 Osteomyelitis of vertebra, sacral and sacrococcygeal region L89.154 Pressure ulcer of sacral region, stage 4 I50.42 Chronic combined  systolic (congestive) and diastolic (congestive) heart f G31.5 Presence of aortocoronary bypass graft Quantity: 1 ailure Electronic Signature(s) Signed: 02/02/2023 10:39:33 AM By: Karl Bales EMT Signed: 02/02/2023 3:44:57 PM By: Geralyn Corwin DO Entered By: Karl Bales on 02/02/2023 10:39:32

## 2023-02-04 ENCOUNTER — Encounter (HOSPITAL_BASED_OUTPATIENT_CLINIC_OR_DEPARTMENT_OTHER): Payer: Medicare Other | Admitting: Internal Medicine

## 2023-02-04 DIAGNOSIS — Z951 Presence of aortocoronary bypass graft: Secondary | ICD-10-CM

## 2023-02-04 DIAGNOSIS — M4628 Osteomyelitis of vertebra, sacral and sacrococcygeal region: Secondary | ICD-10-CM | POA: Diagnosis not present

## 2023-02-04 DIAGNOSIS — I5042 Chronic combined systolic (congestive) and diastolic (congestive) heart failure: Secondary | ICD-10-CM

## 2023-02-04 DIAGNOSIS — L89154 Pressure ulcer of sacral region, stage 4: Secondary | ICD-10-CM

## 2023-02-04 NOTE — Progress Notes (Signed)
Littler, Alias A [id number]) [id number].pdf Page 1 of 1 Visit Report for 02/03/2023 SuperBill Details Patient Name: Date of Service: [name], [place] RK A. 02/03/2023 Medical Record Number: [id number] Patient Account Number: [id number] Date of Birth/Sex: Treating RN: 06/18/1956 (67 y.o. [name] Primary Care Provider: [name] Other Clinician: [name] Referring Provider: Treating Provider/Extender: Valla Leaver in Treatment: 22 Diagnosis Coding ICD-10 Codes Code Description [phone] Pressure ulcer of sacral region, stage 4 I50.42 Chronic combined systolic (congestive) and diastolic (congestive) heart failure Z95.1 Presence of aortocoronary bypass graft M46.28 Osteomyelitis of vertebra, sacral and sacrococcygeal region Facility Procedures CPT4 Code Description Modifier Quantity [id number] G0277-(Facility Use Only) HBOT full body chamber, , 4 ICD-10 Diagnosis Description M46.28 Osteomyelitis of vertebra, sacral and sacrococcygeal region L89.154 Pressure ulcer of sacral region, stage 4 I50.42 Chronic combined systolic (congestive) and diastolic (congestive) heart failure Z95.1 Presence of aortocoronary bypass graft Physician Procedures Quantity CPT4 Code Description Modifier [id number] - WC PHYS HYPERBARIC OXYGEN THERAPY 1 ICD-10 Diagnosis Description M46.28 Osteomyelitis of vertebra, sacral and sacrococcygeal region L89.154 Pressure ulcer of sacral region, stage 4 I50.42 Chronic combined systolic (congestive) and diastolic (congestive) heart failure Z95.1 Presence of aortocoronary bypass graft Electronic Signature(s) Signed: 02/03/2023 11:10:56 AM By: [name] CHT EMT BS , , Signed: 02/03/2023 5:22:52 PM By: Allen Derry PA-C Entered By: [name] on 02/03/2023 11:10:56

## 2023-02-04 NOTE — Progress Notes (Addendum)
Joe, [name] A [id number]) [phone].pdf Page 1 of 2 Visit Report for 02/04/2023 HBO Details Patient Name: Date of Service: Joe [name], [place] RK A. 02/04/2023 8:00 A M Medical Record Number: [id number] Patient Account Number: [id number] Date of Birth/Sex: Treating RN: November 30, 1955 (67 y.o. Joe [name], [name] Primary Care Masaichi Kracht: [name] Other Clinician: [name] Referring Ramondo Dietze: Treating Curry Dulski/Extender: [name] in Treatment: 23 HBO Treatment Course Details Treatment Course Number: 1 Ordering Brynlee Pennywell: Joe [name] T Treatments Ordered: otal 40 HBO Treatment Start Date: 01/20/2023 HBO Indication: Chronic Refractory Osteomyelitis to Sacrum HBO Treatment Details Treatment Number: 12 Patient Type: Outpatient Chamber Type: Monoplace Chamber Serial #: [id number] Treatment Protocol: 2.0 ATA with 90 minutes oxygen, and no air breaks Treatment Details Compression Rate Down: 1.0 psi / minute De-Compression Rate Up: 1.0 psi / minute Air breaks and breathing Decompress Decompress Compress Tx Pressure Begins Reached periods Begins Ends (leave unused spaces blank) Chamber Pressure (ATA 1 2 ------2 1 ) Clock Time (24 hr) 07:57 08:14 - - - - - - 09:44 09:59 Treatment Length: 122 (minutes) Treatment Segments: 4 Vital Signs Capillary Blood Glucose Reference Range: 80 - 120 mg / dl HBO Diabetic Blood Glucose Intervention Range: <131 mg/dl or >332 mg/dl Time Capillary Blood Glucose Pulse Blood Respiratory Action Type: Vitals Pulse: Temperature: Glucose Oximetry Pressure: Rate: Meter #: Taken: Taken: (mg/dl): (%) Pre 95:18 841/66 53 18 97.2 informed physician, asymptomatic for bradycardia. Post 10:02 121/69 46 18 97.5 asymptomatic for bradycardia Treatment Response Treatment Toleration: Well Treatment Completion Status: Treatment Completed without Adverse Event Treatment Notes Mr. Kleist arrived and  prepared for treatment. His vital signs were normal with the exception of heart rate of 53 bpm. Mr. Balestrieri denied any symptoms related to bradycardia. Dr. Mikey Bussing informed. Patient showed signs of edema in his lower extremities. After performing safety check, patient was placed in the chamber which was compressed with 100% oxygen at a rate of 1 psi/min after confirming normal ear equalization. Pre-treatment weight 167.5 lbs. Lower Extremity Measurements: Left Ankle: 26 cm, L Calf: 34.6 cm @ 35 cm R Ankle: 26.3 cm L Calf: 35 cm @ 35 cm Patient tolerated treatment and subsequent decompression at a rate of 1 psi/min. He denied any issues with ear equalization and/or pain. Post-treatment heart rate was 46 bpm. He denied any symptoms related to bradycardia, stating that he felt fine, and felt like he was able to breathe normally. He was stable upon discharge. Physician HBO Attestation: I certify that I supervised this HBO treatment in accordance with Medicare guidelines. A trained emergency response team is readily available per Yes hospital policies and procedures. Continue HBOT as ordered. Yes Electronic Signature(s) Signed: 02/04/2023 4:00:48 PM By: Joe Corwin DO Previous Signature: 02/04/2023 2:58:36 PM Version By: [name] CHT EMT BS , , Previous Signature: 02/04/2023 2:57:42 PM Version By: [name] CHT EMT BS , , Previous Signature: 02/04/2023 8:37:39 AM Version By: [name] CHT EMT BS , , Streb, Sylas A ([id number]) [id number] Page 2 of 2 Previous Signature: 02/04/2023 8:37:39 AM Version By: [name] CHT EMT BS , , Entered By: Joe [name] on 02/04/2023 16:00:34 -------------------------------------------------------------------------------- HBO Safety Checklist Details Patient Name: Date of Service: Joe [name], [place] RK A. 02/04/2023 8:00 A M Medical Record Number: [id number] Patient Account Number: [id number] Date of  Birth/Sex: Treating RN: 31-Oct-1955 (67 y.o. [name] Primary Care Bradlee Heitman: [name] Other Clinician: [name] Referring Maxton Noreen: Treating Fields Oros/Extender: [name] in  Treatment: 23 HBO Safety Checklist Items Safety Checklist Consent Form Signed Patient voided / foley secured and emptied When did you last eato Breakfast Last dose of injectable or oral agent n/a Ostomy pouch emptied and vented if applicable NA All implantable devices assessed, documented and approved NA Intravenous access site secured and place NA Valuables secured Linens and cotton and cotton/polyester blend (less than 51% polyester) Personal oil-based products / skin lotions / body lotions removed Wigs or hairpieces removed NA Smoking or tobacco materials removed NA Books / newspapers / magazines / loose paper removed Cologne, aftershave, perfume and deodorant removed Jewelry removed (may wrap wedding band) Make-up removed NA Hair care products removed Battery operated devices (external) removed Heating patches and chemical warmers removed Titanium eyewear removed Nail polish cured greater than 10 hours NA Casting material cured greater than 10 hours NA Hearing aids removed NA Loose dentures or partials removed NA Prosthetics have been removed NA Patient demonstrates correct use of air break device (if applicable) Patient concerns have been addressed Patient grounding bracelet on and cord attached to chamber Specifics for Inpatients (complete in addition to above) Medication sheet sent with patient NA Intravenous medications needed or due during therapy sent with patient NA Drainage tubes (e.g. nasogastric tube or chest tube secured and vented) NA Endotracheal or Tracheotomy tube secured NA Cuff deflated of air and inflated with saline NA Airway suctioned NA Notes Paper version used prior to treatment start. Electronic  Signature(s) Signed: 02/04/2023 8:28:09 AM By: [name] CHT EMT BS , , Entered By: [name] on 02/04/2023 47:82:95

## 2023-02-05 ENCOUNTER — Encounter (HOSPITAL_BASED_OUTPATIENT_CLINIC_OR_DEPARTMENT_OTHER): Payer: Medicare Other | Admitting: Internal Medicine

## 2023-02-05 ENCOUNTER — Other Ambulatory Visit: Payer: Self-pay

## 2023-02-05 NOTE — Progress Notes (Signed)
Zielinski, Michial A [phone]) [phone].pdf Page 1 of 2 Visit Report for 02/04/2023 Arrival Information Details Patient Name: Date of Service: Theo Dills, [place] RK A. 02/04/2023 8:00 A M Medical Record Number: [id number] Patient Account Number: [id number] Date of Birth/Sex: Treating RN: Jul 25, 1956 (67 y.o. [name], [name] Primary Care Ourania Hamler: [name] Other Clinician: [name] Referring Tamitha Norell: Treating Tobechukwu Emmick/Extender: [name] in Treatment: 23 Visit Information History Since Last Visit All ordered tests and consults were completed: Yes Patient Arrived: Ambulatory Added or deleted any medications: No Arrival Time: 07:30 Any new allergies or adverse reactions: No Accompanied By: mother Had a fall or experienced change in No Transfer Assistance: None activities of daily living that may affect Patient Identification Verified: Yes risk of falls: Secondary Verification Process Completed: Yes Signs or symptoms of abuse/neglect since last visito No Patient Requires Transmission-Based Precautions: No Hospitalized since last visit: No Patient Has Alerts: Yes Implantable device outside of the clinic excluding No Patient Alerts: Patient on Blood Thinner cellular tissue based products placed in the center since last visit: Pain Present Now: No Electronic Signature(s) Signed: 02/04/2023 8:25:25 AM By: [name] CHT EMT BS , , Entered By: [name] on 02/04/2023 08:25:25 -------------------------------------------------------------------------------- Encounter Discharge Information Details Patient Name: Date of Service: Theo Dills, [name] 02/04/2023 8:00 A M Medical Record Number: [id number] Patient Account Number: [id number] Date of Birth/Sex: Treating RN: 10-May-1956 (67 y.o. [name] Primary Care Aryana Wonnacott: [name] Other Clinician: [name] Referring  Keylen Uzelac: Treating Azelea Seguin/Extender: [name] in Treatment: 23 Encounter Discharge Information Items Discharge Condition: Stable Ambulatory Status: Ambulatory Discharge Destination: Home Transportation: Private Auto Accompanied By: mother Schedule Follow-up Appointment: No Clinical Summary of Care: Electronic Signature(s) Signed: 02/04/2023 3:01:09 PM By: [name] CHT EMT BS , , Entered By: [name] on 02/04/2023 15:01:09 -------------------------------------------------------------------------------- Vitals Details Patient Name: Date of Service: Theo Dills, [name] 02/04/2023 8:00 A M Medical Record Number: [id number] Patient Account Number: [id number] Date of Birth/Sex: Treating RN: [birth year] (67 y.o. [name] Primary Care Quenten Nawaz: [name] Other Clinician: [name] Referring Saw Mendenhall: Treating Kritika Stukes/Extender: [name] in Treatment: 23 Vital Signs Time Taken: 07:42 Temperature (F): 97.2 Masters, Otilio A ([id number]) [phone].pdf Page 2 of 2 Height (in): 67 Pulse (bpm): 53 Weight (lbs): 160 Respiratory Rate (breaths/min): 18 Body Mass Index (BMI): 25.1 Blood Pressure (mmHg): 111/67 Reference Range: 80 - 120 mg / dl Electronic Signature(s) Signed: 02/04/2023 8:26:39 AM By: [name] CHT EMT BS , , Entered By: [name] on 02/04/2023 08:26:39

## 2023-02-05 NOTE — Progress Notes (Signed)
Ging, Damico A [id number]) [phone].pdf Page 1 of 1 Visit Report for 02/04/2023 SuperBill Details Patient Name: Date of Service: Joe [name], [place] RK A. 02/04/2023 Medical Record Number: [id number] Patient Account Number: [id number] Date of Birth/Sex: Treating RN: 1955/12/19 (67 y.o. [name], [name] Primary Care Provider: [name] Other Clinician: [name] Referring Provider: Treating Provider/Extender: [name] in Treatment: 23 Diagnosis Coding ICD-10 Codes Code Description [phone] Pressure ulcer of sacral region, stage 4 I50.42 Chronic combined systolic (congestive) and diastolic (congestive) heart failure Z95.1 Presence of aortocoronary bypass graft M46.28 Osteomyelitis of vertebra, sacral and sacrococcygeal region Facility Procedures CPT4 Code Description Modifier Quantity [id number]-(Facility Use Only) HBOT full body chamber, , 4 ICD-10 Diagnosis Description M46.28 Osteomyelitis of vertebra, sacral and sacrococcygeal region L89.154 Pressure ulcer of sacral region, stage 4 I50.42 Chronic combined systolic (congestive) and diastolic (congestive) heart failure Z95.1 Presence of aortocoronary bypass graft Physician Procedures Quantity CPT4 Code Description Modifier [id number] - WC PHYS HYPERBARIC OXYGEN THERAPY 1 ICD-10 Diagnosis Description M46.28 Osteomyelitis of vertebra, sacral and sacrococcygeal region L89.154 Pressure ulcer of sacral region, stage 4 I50.42 Chronic combined systolic (congestive) and diastolic (congestive) heart failure Z95.1 Presence of aortocoronary bypass graft Electronic Signature(s) Signed: 02/04/2023 3:00:42 PM By: [name] CHT EMT BS , , Signed: 02/04/2023 4:00:48 PM By: Geralyn Corwin DO Entered By: [name] on 02/04/2023 15:00:42

## 2023-02-08 ENCOUNTER — Other Ambulatory Visit (HOSPITAL_BASED_OUTPATIENT_CLINIC_OR_DEPARTMENT_OTHER): Payer: Self-pay

## 2023-02-08 ENCOUNTER — Encounter (HOSPITAL_BASED_OUTPATIENT_CLINIC_OR_DEPARTMENT_OTHER): Payer: Medicare Other | Admitting: Internal Medicine

## 2023-02-09 ENCOUNTER — Other Ambulatory Visit (HOSPITAL_BASED_OUTPATIENT_CLINIC_OR_DEPARTMENT_OTHER): Payer: Self-pay

## 2023-02-09 ENCOUNTER — Ambulatory Visit (HOSPITAL_COMMUNITY): Payer: Medicare Other

## 2023-02-09 ENCOUNTER — Other Ambulatory Visit (HOSPITAL_COMMUNITY): Payer: Medicare Other

## 2023-02-09 ENCOUNTER — Encounter (HOSPITAL_BASED_OUTPATIENT_CLINIC_OR_DEPARTMENT_OTHER): Payer: Medicare Other | Admitting: Internal Medicine

## 2023-02-09 DIAGNOSIS — M4628 Osteomyelitis of vertebra, sacral and sacrococcygeal region: Secondary | ICD-10-CM | POA: Diagnosis not present

## 2023-02-10 ENCOUNTER — Other Ambulatory Visit: Payer: Medicare Other

## 2023-02-10 ENCOUNTER — Encounter (HOSPITAL_BASED_OUTPATIENT_CLINIC_OR_DEPARTMENT_OTHER): Payer: Medicare Other | Admitting: Physician Assistant

## 2023-02-10 NOTE — Progress Notes (Signed)
[name], Merritt A ([id number]) [id number] Page 1 of 1 Visit Report for 02/09/2023 SuperBill Details Patient Name: Date of Service: Joe [name], [place] RK A. 02/09/2023 Medical Record Number: [id number] Patient Account Number: [id number] Date of Birth/Sex: Treating RN: August 07, 1956 (67 y.o. [name] Primary Care Provider: [name] Other Clinician: [name] Referring Provider: Treating Provider/Extender: [name] in Treatment: 23 Diagnosis Coding ICD-10 Codes Code Description [phone] Pressure ulcer of sacral region, stage 4 I50.42 Chronic combined systolic (congestive) and diastolic (congestive) heart failure Z95.1 Presence of aortocoronary bypass graft M46.28 Osteomyelitis of vertebra, sacral and sacrococcygeal region Facility Procedures CPT4 Code Description Modifier Quantity [id number] [phone] - WOUND CARE VISIT-LEV 1 EST PT 1 Electronic Signature(s) Signed: 02/09/2023 9:20:06 AM By: [name] CHT EMT BS , , Signed: 02/09/2023 11:15:37 AM By: Geralyn Corwin DO Entered By: [name] on 02/09/2023 09:20:05

## 2023-02-10 NOTE — Progress Notes (Signed)
Jeschke, Alexios A ([phone]) [id number]) [phone].pdf Page 1 of 2 Visit Report for 02/09/2023 Arrival Information Details Patient Name: Date of Service: Joe [name], [place] RK A. 02/09/2023 8:00 A M Medical Record Number: [id number] Patient Account Number: [id number] Date of Birth/Sex: Treating RN: 01-30-1956 (67 y.o. [name] Primary Care Finn Amos: [name] Other Clinician: [name] Referring Avacyn Kloosterman: Treating Allecia Bells/Extender: [name] in Treatment: 23 Visit Information History Since Last Visit All ordered tests and consults were completed: Yes Patient Arrived: Ambulatory Added or deleted any medications: No Arrival Time: 07:30 Any new allergies or adverse reactions: No Accompanied By: mother Had a fall or experienced change in No Transfer Assistance: None activities of daily living that may affect Patient Identification Verified: Yes risk of falls: Secondary Verification Process Completed: Yes Signs or symptoms of abuse/neglect since last visito No Patient Requires Transmission-Based Precautions: No Pain Present Now: Yes Patient Has Alerts: Yes Patient Alerts: Patient on Blood Thinner Electronic Signature(s) Signed: 02/09/2023 8:36:06 AM By: [name] CHT EMT BS , , Entered By: [name] on 02/09/2023 08:36:06 -------------------------------------------------------------------------------- Encounter Discharge Information Details Patient Name: Date of Service: Joe Dills, [name] 02/09/2023 8:00 A M Medical Record Number: [id number] Patient Account Number: [id number] Date of Birth/Sex: Treating RN: 02/12/1956 (67 y.o. [name] Primary Care Aryssa Rosamond: [name] Other Clinician: [name] Referring Kavitha Lansdale: Treating Honestii Marton/Extender: [name] in Treatment: 23 Encounter Discharge Information Items Discharge Condition:  Stable Ambulatory Status: Ambulatory Discharge Destination: Home Transportation: Private Auto Accompanied By: mother Schedule Follow-up Appointment: No Clinical Summary of Care: Electronic Signature(s) Signed: 02/09/2023 9:19:55 AM By: [name] CHT EMT BS , , Entered By: [name] on 02/09/2023 09:19:55 -------------------------------------------------------------------------------- General Visit Notes Details Patient Name: Date of Service: Joe Dills, [name] 02/09/2023 8:00 A M Medical Record Number: [id number] Patient Account Number: [id number] Date of Birth/Sex: Treating RN: 1956-09-12 (67 y.o. [name] Primary Care Moe Graca: [name] Other Clinician: [name] Referring Jersi Mcmaster: Treating Jovin Fester/Extender: [name] in Treatment: 23 Notes Patient arrived complaining of sinus issues and ear ache. Alerted Dr. Lady Gary. Dr. Lady Gary examined patient's ears. Tympanic membranes not visualized due to cerumen build-up. Right ear especially painful but both ears tender. Patient has been stating that ears were clearing during treatment during compression and decompression of the hyperbaric chamber. Informed Dr. Mikey Bussing of the situation. She referred patient to ENT for examination and if necessary tympanostomy tubes for ear equalization during hyperbaric treatments. Patient will have a visit with [facility] [place] this afternoon at 1630. Henry, Oryan A [phone]) [phone].pdf Page 2 of 2 Electronic Signature(s) Signed: 02/09/2023 11:17:46 AM By: [name] CHT EMT BS , , Previous Signature: 02/09/2023 9:18:57 AM Version By: [name] CHT EMT BS , , Previous Signature: 02/09/2023 9:18:24 AM Version By: [name] CHT EMT BS , , Entered By: [name] on 02/09/2023  11:17:46 -------------------------------------------------------------------------------- Vitals Details Patient Name: Date of Service: Joe Dills, [name] 02/09/2023 8:00 A M Medical Record Number: [id number] Patient Account Number: [id number] Date of Birth/Sex: Treating RN: March 22, 1956 (67 y.o. [name] Primary Care Reneta Niehaus: [name] Other Clinician: [name] Referring Abdulrahman Bracey: Treating Brahim Dolman/Extender: [name] in Treatment: 23 Vital Signs Time Taken: 07:40 Reference Range: 80 - 120 mg / dl Height (in): 67 Weight (lbs): 158.5 Body Mass Index (BMI): 24.8 Notes Patient  left prior to collecting vital signs. Electronic Signature(s) Signed: 02/09/2023 9:16:51 AM By: [name] CHT EMT BS , , Previous Signature: 02/09/2023 8:56:56 AM Version By: [name] CHT EMT BS , , Entered By: [name] on 02/09/2023 09:16:51

## 2023-02-11 ENCOUNTER — Encounter (HOSPITAL_BASED_OUTPATIENT_CLINIC_OR_DEPARTMENT_OTHER): Payer: Medicare Other | Admitting: Internal Medicine

## 2023-02-11 ENCOUNTER — Encounter (HOSPITAL_BASED_OUTPATIENT_CLINIC_OR_DEPARTMENT_OTHER): Payer: Self-pay

## 2023-02-11 ENCOUNTER — Encounter (HOSPITAL_BASED_OUTPATIENT_CLINIC_OR_DEPARTMENT_OTHER): Payer: Medicare Other | Attending: Internal Medicine | Admitting: Internal Medicine

## 2023-02-11 DIAGNOSIS — I480 Paroxysmal atrial fibrillation: Secondary | ICD-10-CM | POA: Diagnosis not present

## 2023-02-11 DIAGNOSIS — I2510 Atherosclerotic heart disease of native coronary artery without angina pectoris: Secondary | ICD-10-CM | POA: Diagnosis not present

## 2023-02-11 DIAGNOSIS — I447 Left bundle-branch block, unspecified: Secondary | ICD-10-CM | POA: Insufficient documentation

## 2023-02-11 DIAGNOSIS — I5042 Chronic combined systolic (congestive) and diastolic (congestive) heart failure: Secondary | ICD-10-CM | POA: Diagnosis not present

## 2023-02-11 DIAGNOSIS — Z953 Presence of xenogenic heart valve: Secondary | ICD-10-CM | POA: Diagnosis not present

## 2023-02-11 DIAGNOSIS — M1990 Unspecified osteoarthritis, unspecified site: Secondary | ICD-10-CM | POA: Insufficient documentation

## 2023-02-11 DIAGNOSIS — G8929 Other chronic pain: Secondary | ICD-10-CM | POA: Insufficient documentation

## 2023-02-11 DIAGNOSIS — I110 Hypertensive heart disease with heart failure: Secondary | ICD-10-CM | POA: Insufficient documentation

## 2023-02-11 DIAGNOSIS — Z951 Presence of aortocoronary bypass graft: Secondary | ICD-10-CM | POA: Diagnosis not present

## 2023-02-11 DIAGNOSIS — L89154 Pressure ulcer of sacral region, stage 4: Secondary | ICD-10-CM

## 2023-02-11 DIAGNOSIS — M4628 Osteomyelitis of vertebra, sacral and sacrococcygeal region: Secondary | ICD-10-CM | POA: Insufficient documentation

## 2023-02-11 DIAGNOSIS — Z7901 Long term (current) use of anticoagulants: Secondary | ICD-10-CM | POA: Diagnosis not present

## 2023-02-12 ENCOUNTER — Encounter (HOSPITAL_BASED_OUTPATIENT_CLINIC_OR_DEPARTMENT_OTHER): Payer: Medicare Other | Admitting: Internal Medicine

## 2023-02-12 NOTE — Progress Notes (Signed)
Joe [name] [id number]) [phone].pdf Page 1 of 6 Visit Report for 02/11/2023 Chief Complaint Document Details Patient Name: Date of Service: Joe [name], [place] [name]. 02/11/2023 8:00 [name] Medical Record Number: [id number] Patient Account Number: [id number] Date of Birth/Sex: Treating RN: 06/02/1956 (67 y.o. M) Primary Care Provider: Alva [name] Other Clinician: Referring Provider: Treating Provider/Extender: Joe [name] in Treatment: 24 Information Obtained from: Patient Chief Complaint 08/27/2022; patient presents for sacral wound Electronic Signature(s) Signed: 02/11/2023 11:49:34 AM By: Joe Corwin DO Entered By: Joe [name] on 02/11/2023 09:39:07 -------------------------------------------------------------------------------- HPI Details Patient Name: Date of Service: Joe Dills, MA [name]. 02/11/2023 8:00 [name] Medical Record Number: [id number] Patient Account Number: [id number] Date of Birth/Sex: Treating RN: [birth year] (67 y.o. M) Primary Care Provider: Alva [name] Other Clinician: Referring Provider: Treating Provider/Extender: Joe [name] in Treatment: 24 History of Present Illness HPI Description: 08/27/2022 [name] is [name] 67 year old male with [name] past medical history of chronic combined systolic diastolic heart failure, coronary artery disease status post CABG x1, left bundle branch block That presents to the clinic for [name] sacral ulcer. Patient was admitted to the hospital on 07/08/2022 For 6 weeks and discharged on 08/13/2022 For placement of [name] bioprosthetic aortic valve due to an ascending aortic aneurysm. During this admission patient states that he developed the sacral ulcer. He is not bedbound and ambulates with [name] walker. He is currently living with his mother and home health comes out for dressing changes. Currently he is using Medihoney every 3 days. There is  mild odor. Currently denies systemic signs of infection. 11/30; patient presents for follow-up. He has been using Dakin's wet-to-dry dressings mother is present during the encounter. He has no issues or complaints today. He denies signs of infection. 12/14; patient presents for follow-up. He has been using Dakin's wet-to-dry dressings. He has no issues or complaints today. He denies signs of infection. 12/28; patient presents for follow-up. He has been using Dakin's wet-to-dry dressings. He has no issues or complaints today. 1/11; patient presents for follow-up. He had [name] sacral x-ray done at last clinic visit that showed findings consistent with osteomyelitis to the mid to inferior sacrum. He is scheduled to see infectious disease on 1/15. He has been using Dakin's wet-to-dry dressings. He has chronic pain to the site. He denies systemic signs of infection. 1/25; patient presents for follow-up. He saw Dr. Earlene [name] with infectious disease on 1/15. Patient was started on [name] PICC line and plan is for 6 weeks of IV antibiotics. Patient has been using Dakin's wet-to-dry packing strips. 2/15; patient presents for follow-up. He is on week 3 of IV antibiotics. He has been using Dakin's wet-to-dry packing strips. He has no issues or complaints today. 3/7; patient presents for follow-up. He has completed his course of IV antibiotics. He has been using Dakin's wet-to-dry packing strip. He denies any pain to the wound site as he previously had chronic pain. He wants to hold off on doing HBO therapy. 3/14; patient presents for follow-up. We started endoform at last clinic visit. He has done well with this. We also obtained [name] PCR culture that did not grow bacteria. There was Candida present however this looks to be colonization. Patient has no issues or complaints today. 3/28; patient presents for follow-up. He has been using endoform to the wound bed. There is still [name] decent tunnel present. He has been approved for  HBO therapy. Due to his echo  showing [name] left ventricular ejection fraction of less than 20 he will need cardiac clearance to proceed with HBO. This was discussed with the patient and mother. They have an appointment with cardiology next week. 4/8; patient presents for follow-up. He has been using endoform to the wound bed. Per mother who changes the dressing there is still [name] tunnel present. It was hard to appreciate this today however there is still an open wound. He has been approved for HBO therapy. He was cleared by cardiology to proceed. He would like to start treatment. 4/18; patient presents for follow-up. He has been using antibiotic ointment to the wound bed. Wound is smaller. He continues HBO therapy without issues. 5/2; patient presents for follow-up. The wound has healed. He is currently doing HBO therapy for chronic refractory osteomyelitis of the sacrum. Last week he started experiencing pressure and otalgia and was referred to ENT for potential ear tube placement. He had cerumen impaction in both ears. He was able to [facility], Duncan [name] ([id number]) [phone].pdf Page 2 of 6 have serum disimpaction of the right here however the left persisted. Patient was unable to tolerate this office procedure. At this time it was recommended that he continue to use [name] solution to help remove cerumen in the left ear. Electronic Signature(s) Signed: 02/11/2023 11:49:34 AM By: Joe Corwin DO Entered By: Joe [name] on 02/11/2023 10:11:03 -------------------------------------------------------------------------------- Physical Exam Details Patient Name: Date of Service: Joe Dills, MA [name]. 02/11/2023 8:00 [name] Medical Record Number: [id number] Patient Account Number: [id number] Date of Birth/Sex: Treating RN: 17-Dec-1955 (67 y.o. M) Primary Care Provider: Alva [name] Other Clinician: Referring Provider: Treating Provider/Extender: Joe [name] in Treatment: 24 Constitutional respirations regular, non-labored and within target range for patient.. Cardiovascular 2+ dorsalis pedis/posterior tibialis pulses. Psychiatric pleasant and cooperative. Notes Sacral ulcer: Epithelization to the previous wound site. Cerumen in the left ear, irritation to the tympanic membrane on the right ear Electronic Signature(s) Signed: 02/11/2023 11:49:34 AM By: Joe Corwin DO Entered By: Joe [name] on 02/11/2023 09:55:07 -------------------------------------------------------------------------------- Physician Orders Details Patient Name: Date of Service: Joe Dills, MA [name]. 02/11/2023 8:00 [name] Medical Record Number: [id number] Patient Account Number: [id number] Date of Birth/Sex: Treating RN: [birth year] (67 y.o. [name] Primary Care Provider: Alva [name] Other Clinician: Referring Provider: Treating Provider/Extender: Joe [name] in Treatment: 24 Verbal / Phone Orders: No Diagnosis Coding ICD-10 Coding Code Description L89.154 Pressure ulcer of sacral region, stage 4 I50.42 Chronic combined systolic (congestive) and diastolic (congestive) heart failure Z95.1 Presence of aortocoronary bypass graft M46.28 Osteomyelitis of vertebra, sacral and sacrococcygeal region Follow-up Appointments ppointment in 2 weeks. - Hold Hyperbarics for 2 weeks. Dr. Mikey Bussing 0800 room 8 02/25/2023. Return [name] Other: - continue to use the solution for ear drops. make an appt with primary care provider next week for earwax removal. Off-Loading Turn and reposition every 2 hours Electronic Signature(s) Signed: 02/11/2023 11:49:34 AM By: Joe Corwin DO Entered By: Joe [name] on 02/11/2023 09:55:13 Sheehy, Madoc [name] ([id number]) [id number] Page 3 of 6 -------------------------------------------------------------------------------- Problem List Details Patient Name: Date  of Service: Joe [name], [place] [name]. 02/11/2023 8:00 [name] Medical Record Number: [id number] Patient Account Number: [id number] Date of Birth/Sex: Treating RN: [birth year] (67 y.o. [name] Primary Care Provider: Alva [name] Other Clinician: Referring Provider: Treating Provider/Extender: Joe [name] in Treatment: 24 Active Problems ICD-10 Encounter Code Description Active Date MDM Diagnosis L89.154 Pressure  ulcer of sacral region, stage 4 08/27/2022 No Yes I50.42 Chronic combined systolic (congestive) and diastolic (congestive) heart failure 08/27/2022 No Yes Z95.1 Presence of aortocoronary bypass graft 08/27/2022 No Yes M46.28 Osteomyelitis of vertebra, sacral and sacrococcygeal region 10/22/2022 No Yes Inactive Problems Resolved Problems Electronic Signature(s) Signed: 02/11/2023 11:49:34 AM By: Joe Corwin DO Entered By: Joe [name] on 02/11/2023 09:38:20 -------------------------------------------------------------------------------- Progress Note Details Patient Name: Date of Service: Joe Dills, MA [name]. 02/11/2023 8:00 [name] Medical Record Number: [id number] Patient Account Number: [id number] Date of Birth/Sex: Treating RN: 1956/05/23 (67 y.o. M) Primary Care Provider: Alva [name] Other Clinician: Referring Provider: Treating Provider/Extender: Joe [name] in Treatment: 24 Subjective Chief Complaint Information obtained from Patient 08/27/2022; patient presents for sacral wound History of Present Illness (HPI) 08/27/2022 [name] is [name] 67 year old male with [name] past medical history of chronic combined systolic diastolic heart failure, coronary artery disease status post CABG x1, left bundle branch block That presents to the clinic for [name] sacral ulcer. Patient was admitted to the hospital on 07/08/2022 For 6 weeks and discharged on 08/13/2022 For placement of [name] bioprosthetic aortic  valve due to an ascending aortic aneurysm. During this admission patient states that he developed the sacral ulcer. He is not bedbound and ambulates with [name] walker. He is currently living with his mother and home health comes out for dressing changes. Currently he is using Medihoney every 3 days. There is mild odor. Currently denies systemic signs of infection. 11/30; patient presents for follow-up. He has been using Dakin's wet-to-dry dressings mother is present during the encounter. He has no issues or complaints today. He denies signs of infection. 12/14; patient presents for follow-up. He has been using Dakin's wet-to-dry dressings. He has no issues or complaints today. He denies signs of infection. 12/28; patient presents for follow-up. He has been using Dakin's wet-to-dry dressings. He has no issues or complaints today. 1/11; patient presents for follow-up. He had [name] sacral x-ray done at last clinic visit that showed findings consistent with osteomyelitis to the mid to inferior sacrum. He is scheduled to see infectious disease on 1/15. He has been using Dakin's wet-to-dry dressings. He has chronic pain to the site. He denies systemic signs of infection. [name], Joe [name] [phone].pdf Page 4 of 6 1/25; patient presents for follow-up. He saw Dr. Earlene [name] with infectious disease on 1/15. Patient was started on [name] PICC line and plan is for 6 weeks of IV antibiotics. Patient has been using Dakin's wet-to-dry packing strips. 2/15; patient presents for follow-up. He is on week 3 of IV antibiotics. He has been using Dakin's wet-to-dry packing strips. He has no issues or complaints today. 3/7; patient presents for follow-up. He has completed his course of IV antibiotics. He has been using Dakin's wet-to-dry packing strip. He denies any pain to the wound site as he previously had chronic pain. He wants to hold off on doing HBO therapy. 3/14; patient presents for  follow-up. We started endoform at last clinic visit. He has done well with this. We also obtained [name] PCR culture that did not grow bacteria. There was Candida present however this looks to be colonization. Patient has no issues or complaints today. 3/28; patient presents for follow-up. He has been using endoform to the wound bed. There is still [name] decent tunnel present. He has been approved for HBO therapy. Due to his echo showing [name] left ventricular ejection fraction of less than 20 he will need  cardiac clearance to proceed with HBO. This was discussed with the patient and mother. They have an appointment with cardiology next week. 4/8; patient presents for follow-up. He has been using endoform to the wound bed. Per mother who changes the dressing there is still [name] tunnel present. It was hard to appreciate this today however there is still an open wound. He has been approved for HBO therapy. He was cleared by cardiology to proceed. He would like to start treatment. 4/18; patient presents for follow-up. He has been using antibiotic ointment to the wound bed. Wound is smaller. He continues HBO therapy without issues. 5/2; patient presents for follow-up. The wound has healed. He is currently doing HBO therapy for chronic refractory osteomyelitis of the sacrum. Last week he started experiencing pressure and otalgia and was referred to ENT for potential ear tube placement. He had cerumen impaction in both ears. He was able to have serum disimpaction of the right here however the left persisted. Patient was unable to tolerate this office procedure. At this time it was recommended that he continue to use [name] solution to help remove cerumen in the left ear. Patient History Family History Heart Disease - Mother,Maternal Grandparents. Social History Never smoker, Alcohol Use - Never, Drug Use - No History, Caffeine Use - Never. Medical History Cardiovascular Patient has history of Coronary Artery Disease,  Hypertension Musculoskeletal Patient has history of Osteoarthritis - bilateral hips; left shoulder Hospitalization/Surgery History - cardioversions 07/2022. - CABG 07/08/2022. - hip replacements 2015. - 2018 left shoulder surgery. - epicardial pacing lead placement 07/08/2022. Medical [name] Surgical History Notes nd Constitutional Symptoms (General Health) cognitive deficits congenital brain damage expressive speech delay Cardiovascular PVC LBBB thoracic ascending aortic aneurysm epicardial pacing lead placement Gastrointestinal umbilical hernia Genitourinary kidney stones Objective Constitutional respirations regular, non-labored and within target range for patient.. Vitals Time Taken: 8:10 AM, Height: 67 in, Weight: 158.5 lbs, BMI: 24.8, Temperature: 98 F, Pulse: 65 bpm, Respiratory Rate: 16 breaths/min, Blood Pressure: 114/68 mmHg, Pulse Oximetry: 93 %. Cardiovascular 2+ dorsalis pedis/posterior tibialis pulses. Psychiatric pleasant and cooperative. General Notes: Sacral ulcer: Epithelization to the previous wound site. Cerumen in the left ear, irritation to the tympanic membrane on the right ear Integumentary (Hair, Skin) Wound #1 status is Open. Original cause of wound was Pressure Injury. The date acquired was: 08/07/2022. The wound has been in treatment 24 weeks. The wound is located on the Sacrum. The wound measures 0cm length x 0cm width x 0cm depth; 0cm^2 area and 0cm^3 volume. There is no tunneling or undermining noted. There is [name] none present amount of drainage noted. The wound margin is distinct with the outline attached to the wound base. There is no granulation within the wound bed. There is no necrotic tissue within the wound bed. The periwound skin appearance did not exhibit: Callus, Crepitus, Excoriation, Induration, Rash, Scarring, Dry/Scaly, Maceration, Atrophie Blanche, Cyanosis, Ecchymosis, Hemosiderin Staining, Mottled, Pallor, Rubor, Erythema. [name], Joe [name]  [phone].pdf Page 5 of 6 Assessment Active Problems ICD-10 Pressure ulcer of sacral region, stage 4 Chronic combined systolic (congestive) and diastolic (congestive) heart failure Presence of aortocoronary bypass graft Osteomyelitis of vertebra, sacral and sacrococcygeal region Patient's sacral wound has healed. He has been doing HBO therapy for his chronic refractory osteomyelitis of the sacrum. Last week He was referred to ENT due to otalgia and cerumen in the ears bilaterally. Unfortunately he is still impacted on the left ear and he is using over-the-counter debrox drops. The right ear has Has  irritation near the tympanic membrane.In order to do tubes in the ears he would have to be admitted and go under general anesthesia per ENT notes. The patient cannot tolerate the procedure in office. For now I recommended taking [name] break from HBO for the next 2 weeks. Continue to use Debrox drops. I will reevaluate him and we will plan on deciding whether we will continue HBO or not at that time. Plan Follow-up Appointments: Return Appointment in 2 weeks. - Hold Hyperbarics for 2 weeks. Dr. Mikey Bussing 0800 room 8 02/25/2023. Other: - continue to use the solution for ear drops. make an appt with primary care provider next week for earwax removal. Off-Loading: Turn and reposition every 2 hours 1. Follow-up in 2 weeks Electronic Signature(s) Signed: 02/11/2023 11:49:34 AM By: Joe Corwin DO Entered By: Joe [name] on 02/11/2023 10:11:47 -------------------------------------------------------------------------------- HxROS Details Patient Name: Date of Service: Joe Dills, MA [name]. 02/11/2023 8:00 [name] Medical Record Number: [id number] Patient Account Number: [id number] Date of Birth/Sex: Treating RN: [birth year] (67 y.o. M) Primary Care Provider: Alva [name] Other Clinician: Referring Provider: Treating Provider/Extender: Joe [name] in Treatment: 24 Constitutional Symptoms (General Health) Medical History: Past Medical History Notes: cognitive deficits congenital brain damage expressive speech delay Cardiovascular Medical History: Positive for: Coronary Artery Disease; Hypertension Past Medical History Notes: PVC LBBB thoracic ascending aortic aneurysm epicardial pacing lead placement Gastrointestinal Medical History: Past Medical History Notes: umbilical hernia Genitourinary Medical History: Past Medical History Notes: [name], Joe [name] ([id number]) [phone].pdf Page 6 of 6 kidney stones Musculoskeletal Medical History: Positive for: Osteoarthritis - bilateral hips; left shoulder Immunizations Pneumococcal Vaccine: Received Pneumococcal Vaccination: No Implantable Devices No devices added Hospitalization / Surgery History Type of Hospitalization/Surgery cardioversions 07/2022 CABG 07/08/2022 hip replacements 2015 2018 left shoulder surgery epicardial pacing lead placement 07/08/2022 Family and Social History Heart Disease: Yes - Mother,Maternal Grandparents; Never smoker; Alcohol Use: Never; Drug Use: No History; Caffeine Use: Never; Financial Concerns: No; Food, Clothing or Shelter Needs: No; Support System Lacking: No; Transportation Concerns: No Electronic Signature(s) Signed: 02/11/2023 11:49:34 AM By: Joe Corwin DO Entered By: Joe [name] on 02/11/2023 09:50:52 -------------------------------------------------------------------------------- SuperBill Details Patient Name: Date of Service: Joe Dills, MA [name]. 02/11/2023 Medical Record Number: [id number] Patient Account Number: [id number] Date of Birth/Sex: Treating RN: 21-Jan-1956 (67 y.o. M) Primary Care Provider: Alva [name] Other Clinician: Referring Provider: Treating Provider/Extender: Joe [name] in Treatment: 24 Diagnosis Coding ICD-10  Codes Code Description L89.154 Pressure ulcer of sacral region, stage 4 I50.42 Chronic combined systolic (congestive) and diastolic (congestive) heart failure Z95.1 Presence of aortocoronary bypass graft M46.28 Osteomyelitis of vertebra, sacral and sacrococcygeal region Physician Procedures : CPT4 Code Description Modifier [id number] - WC PHYS LEVEL 3 - EST PT ICD-10 Diagnosis Description L89.154 Pressure ulcer of sacral region, stage 4 I50.42 Chronic combined systolic (congestive) and diastolic (congestive) heart failure Z95.1 Presence  of aortocoronary bypass graft M46.28 Osteomyelitis of vertebra, sacral and sacrococcygeal region Quantity: 1 Electronic Signature(s) Signed: 02/11/2023 11:49:34 AM By: Joe Corwin DO Entered By: Joe [name] on 02/11/2023 10:11:57

## 2023-02-15 ENCOUNTER — Encounter (HOSPITAL_BASED_OUTPATIENT_CLINIC_OR_DEPARTMENT_OTHER): Payer: Medicare Other | Admitting: Internal Medicine

## 2023-02-16 ENCOUNTER — Encounter (HOSPITAL_BASED_OUTPATIENT_CLINIC_OR_DEPARTMENT_OTHER): Payer: Medicare Other | Admitting: Internal Medicine

## 2023-02-16 NOTE — Progress Notes (Addendum)
[name], Joe [name] [phone] [id number]) [id number].pdf Page 1 of 7 Visit Report for 02/11/2023 Arrival Information Details Patient Name: Date of Service: Joe [name], [place] Joe [name]. 02/11/2023 8:00 [name] Medical Record Number: [id number] Patient Account Number: [id number] Date of Birth/Sex: Treating RN: Jan 27, 1956 (67 y.o. Joe [name] Primary Care Lynell Greenhouse: [name] Other Clinician: Referring Joe [name]: Treating Raneen Jaffer/Extender: Joe [name] in Treatment: 24 Visit Information History Since Last Visit Added or deleted any medications: No Patient Arrived: Ambulatory Any new allergies or adverse reactions: No Arrival Time: 08:10 Had [name] fall or experienced change in No Accompanied By: mother activities of daily living that may affect Transfer Assistance: None risk of falls: Patient Identification Verified: Yes Signs or symptoms of abuse/neglect since last visito No Secondary Verification Process Completed: Yes Hospitalized since last visit: No Patient Requires Transmission-Based Precautions: No Implantable device outside of the clinic excluding No Patient Has Alerts: Yes cellular tissue based products placed in the center Patient Alerts: Patient on Blood Thinner since last visit: Has Dressing in Place as Prescribed: Yes Pain Present Now: No Notes ENT yesterday to remove earwax unable to remove from right ear. Left ear successful. Joe [name] made aware. Electronic Signature(s) Signed: 02/15/2023 5:08:59 PM By: Shawn Stall RN, BSN Entered By: Shawn Stall on 02/11/2023 08:13:17 -------------------------------------------------------------------------------- Clinic Level of Care Assessment Details Patient Name: Date of Service: Joe [name] Joe [name]. 02/11/2023 8:00 [name] Medical Record Number: [id number] Patient Account Number: [id number] Date of Birth/Sex: Treating RN: [birth year] (67 y.o. M) Primary Care Joe [name]: [name] Other  Clinician: Referring Joe [name]: Treating Joe [name]: Joe [name] in Treatment: 24 Clinic Level of Care Assessment Items TOOL 4 Quantity Score []  - 0 Use when only an EandM is performed on FOLLOW-UP visit ASSESSMENTS - Nursing Assessment / Reassessment X- 1 10 Reassessment of Co-morbidities (includes updates in patient status) X- 1 5 Reassessment of Adherence to Treatment Plan ASSESSMENTS - Wound and Skin [name] ssessment / Reassessment X - Simple Wound Assessment / Reassessment - one wound 1 5 []  - 0 Complex Wound Assessment / Reassessment - multiple wounds []  - 0 Dermatologic / Skin Assessment (not related to wound area) ASSESSMENTS - Focused Assessment []  - 0 Circumferential Edema Measurements - multi extremities Joe [name] ([id number]) [phone].pdf Page 2 of 7 []  - 0 Nutritional Assessment / Counseling / Intervention []  - 0 Lower Extremity Assessment (monofilament, tuning fork, pulses) []  - 0 Peripheral Arterial Disease Assessment (using hand held doppler) ASSESSMENTS - Ostomy and/or Continence Assessment and Care []  - 0 Incontinence Assessment and Management []  - 0 Ostomy Care Assessment and Management (repouching, etc.) PROCESS - Coordination of Care X - Simple Patient / Family Education for ongoing care 1 15 []  - 0 Complex (extensive) Patient / Family Education for ongoing care []  - 0 Staff obtains Chiropractor, Records, T Results / Process Orders est []  - 0 Staff telephones HHA, Nursing Homes / Clarify orders / etc []  - 0 Routine Transfer to another Facility (non-emergent condition) []  - 0 Routine Hospital Admission (non-emergent condition) []  - 0 New Admissions / Manufacturing engineer / Ordering NPWT Apligraf, etc. , []  - 0 Emergency Hospital Admission (emergent condition) X- 1 10 Simple Discharge Coordination []  - 0 Complex (extensive) Discharge Coordination PROCESS - Special Needs []  -  0 Pediatric / Minor Patient Management []  - 0 Isolation Patient Management []  - 0 Hearing / Language / Visual special needs []  - 0 Assessment of Community assistance (transportation, D/C planning,  etc.) []  - 0 Additional assistance / Altered mentation []  - 0 Support Surface(s) Assessment (bed, cushion, seat, etc.) INTERVENTIONS - Wound Cleansing / Measurement X - Simple Wound Cleansing - one wound 1 5 []  - 0 Complex Wound Cleansing - multiple wounds X- 1 5 Wound Imaging (photographs - any number of wounds) []  - 0 Wound Tracing (instead of photographs) X- 1 5 Simple Wound Measurement - one wound []  - 0 Complex Wound Measurement - multiple wounds INTERVENTIONS - Wound Dressings []  - 0 Small Wound Dressing one or multiple wounds []  - 0 Medium Wound Dressing one or multiple wounds []  - 0 Large Wound Dressing one or multiple wounds []  - 0 Application of Medications - topical []  - 0 Application of Medications - injection INTERVENTIONS - Miscellaneous []  - 0 External ear exam []  - 0 Specimen Collection (cultures, biopsies, blood, body fluids, etc.) []  - 0 Specimen(s) / Culture(s) sent or taken to Lab for analysis []  - 0 Patient Transfer (multiple staff / Nurse, adult / Similar devices) []  - 0 Simple Staple / Suture removal (25 or less) []  - 0 Complex Staple / Suture removal (26 or more) Joe [name] ([id number]) [id number] Page 3 of 7 []  - 0 Hypo / Hyperglycemic Management (close monitor of Blood Glucose) []  - 0 Ankle / Brachial Index (ABI) - do not check if billed separately X- 1 5 Vital Signs Has the patient been seen at the hospital within the last three years: Yes Total Score: 65 Level Of Care: New/Established - Level 2 Electronic Signature(s) Signed: 04/12/2023 3:00:37 PM By: Pearletha Alfred Entered By: Pearletha Alfred on 03/10/2023 09:13:29 -------------------------------------------------------------------------------- Lower Extremity  Assessment Details Patient Name: Date of Service: Joe Dills, [name] Joe [name]. 02/11/2023 8:00 [name] Medical Record Number: [id number] Patient Account Number: [id number] Date of Birth/Sex: Treating RN: 1956-01-16 (67 y.o. Joe [name] Primary Care Joe [name]: [name] Other Clinician: Referring Yvan Dority: Treating Slate Debroux/Extender: Joe [name] in Treatment: 24 Electronic Signature(s) Signed: 02/15/2023 5:08:59 PM By: Shawn Stall RN, BSN Entered By: Shawn Stall on 02/11/2023 08:13:24 -------------------------------------------------------------------------------- Multi Wound Chart Details Patient Name: Date of Service: Joe Dills, [name] Joe [name]. 02/11/2023 8:00 [name] Medical Record Number: [id number] Patient Account Number: [id number] Date of Birth/Sex: Treating RN: [birth year] (67 y.o. M) Primary Care Catrena Vari: [name] Other Clinician: Referring Colbert Curenton: Treating Aleksa Catterton/Extender: Joe [name] in Treatment: 24 Vital Signs Height(in): 67 Pulse(bpm): 65 Weight(lbs): 158.5 Blood Pressure(mmHg): 114/68 Body Mass Index(BMI): 24.8 Temperature(F): 98 Respiratory Rate(breaths/min): 16 [1:Photos:] [N/[name]:N/[name]] Sacrum N/[name] N/[name] Wound Location: Pressure Injury N/[name] N/[name] Wounding Event: Pressure Ulcer N/[name] N/[name] Primary Etiology: Coronary Artery Disease, N/[name] N/[name] Comorbid History: Hypertension, Osteoarthritis Joe [name] ([id number]) [phone].pdf Page 4 of 7 08/07/2022 N/[name] N/[name] Date Acquired: 31 N/[name] N/[name] Weeks of Treatment: Open N/[name] N/[name] Wound Status: No N/[name] N/[name] Wound Recurrence: 0x0x0 N/[name] N/[name] Measurements L x W x D (cm) 0 N/[name] N/[name] [name] (cm) : rea 0 N/[name] N/[name] Volume (cm) : 100.00% N/[name] N/[name] % Reduction in [name] rea: 100.00% N/[name] N/[name] % Reduction in Volume: Category/Stage IV N/[name] N/[name] Classification: None Present N/[name] N/[name] Exudate [name] mount: Distinct, outline attached N/[name] N/[name] Wound Margin: None Present  (0%) N/[name] N/[name] Granulation [name] mount: None Present (0%) N/[name] N/[name] Necrotic [name] mount: Fascia: No N/[name] N/[name] Exposed Structures: Fat Layer (Subcutaneous Tissue): No Tendon: No Muscle: No Joint: No Bone: No Large (67-100%) N/[name] N/[name] Epithelialization: Excoriation: No N/[name] N/[name] Periwound Skin Texture: Induration: No  Callus: No Crepitus: No Rash: No Scarring: No Maceration: No N/[name] N/[name] Periwound Skin Moisture: Dry/Scaly: No Atrophie Blanche: No N/[name] N/[name] Periwound Skin Color: Cyanosis: No Ecchymosis: No Erythema: No Hemosiderin Staining: No Mottled: No Pallor: No Rubor: No Treatment Notes Electronic Signature(s) Signed: 02/11/2023 11:49:34 AM By: Geralyn Corwin DO Entered By: Geralyn Corwin on 02/11/2023 09:38:49 -------------------------------------------------------------------------------- Multi-Disciplinary Care Plan Details Patient Name: Date of Service: Joe Dills, [name] Joe [name]. 02/11/2023 8:00 [name] Medical Record Number: [id number] Patient Account Number: [id number] Date of Birth/Sex: Treating RN: 03/22/1956 (67 y.o. Joe [name] Primary Care Yanilen Adamik: [name] Other Clinician: Referring Mirza Fessel: Treating Ellenora Talton/Extender: Joe [name] in Treatment: 24 Active Inactive Electronic Signature(s) Signed: 04/13/2023 11:57:19 AM By: Shawn Stall RN, BSN Previous Signature: 02/15/2023 5:08:59 PM Version By: Shawn Stall RN, BSN Entered By: Shawn Stall on 04/13/2023 11:57:19 Joe [name] ([id number]) [id number] Page 5 of 7 -------------------------------------------------------------------------------- Pain Assessment Details Patient Name: Date of Service: Joe [name], [place] Joe [name]. 02/11/2023 8:00 [name] Medical Record Number: [id number] Patient Account Number: [id number] Date of Birth/Sex: Treating RN: 1956-05-14 (67 y.o. Joe [name] Primary Care Hildreth Orsak: [name] Other Clinician: Referring  Oriel Rumbold: Treating Cyncere Ruhe/Extender: Joe [name] in Treatment: 24 Active Problems Location of Pain Severity and Description of Pain Patient Has Paino No Site Locations Pain Management and Medication Current Pain Management: Electronic Signature(s) Signed: 02/15/2023 5:08:59 PM By: Shawn Stall RN, BSN Entered By: Shawn Stall on 02/11/2023 08:12:41 -------------------------------------------------------------------------------- Patient/Caregiver Education Details Patient Name: Date of Service: Joe Dills, [name] Joe [name]. 5/2/2024andnbsp8:00 [name] Medical Record Number: [id number] Patient Account Number: [id number] Date of Birth/Gender: Treating RN: [birth year] (67 y.o. Joe [name] Primary Care Physician: [name] Other Clinician: Referring Physician: Treating Physician/Extender: Joe [name] in Treatment: 24 Education Assessment Education Provided To: Patient Education Topics Provided Wound/Skin Impairment: Handouts: Caring for Your Ulcer Methods: Explain/Verbal Responses: Reinforcements needed Electronic Signature(s) Signed: 02/15/2023 5:08:59 PM By: Shawn Stall RN, BSN Lawn, Grason [name] ([id number]) [phone].pdf Page 6 of 7 Entered By: Shawn Stall on 02/11/2023 08:15:18 -------------------------------------------------------------------------------- Wound Assessment Details Patient Name: Date of Service: Joe [name], [place] Joe [name]. 02/11/2023 8:00 [name] Medical Record Number: [id number] Patient Account Number: [id number] Date of Birth/Sex: Treating RN: Nov 19, 1955 (67 y.o. [name], [name] Primary Care Drevon Plog: [name] Other Clinician: Referring Blasa Raisch: Treating Jaymee Tilson/Extender: [name] in Treatment: 24 Wound Status Wound Number: 1 Primary Etiology: Pressure Ulcer Wound Location: Sacrum Wound Status: Open Wounding Event: Pressure  Injury Comorbid History: Coronary Artery Disease, Hypertension, Osteoarthritis Date Acquired: 08/07/2022 Weeks Of Treatment: 24 Clustered Wound: No Photos Wound Measurements Length: (cm) Width: (cm) Depth: (cm) Area: (cm) Volume: (cm) 0 % Reduction in Area: 100% 0 % Reduction in Volume: 100% 0 Epithelialization: Large (67-100%) 0 Tunneling: No 0 Undermining: No Wound Description Classification: Category/Stage IV Wound Margin: Distinct, outline attached Exudate Amount: None Present Foul Odor After Cleansing: No Slough/Fibrino No Wound Bed Granulation Amount: None Present (0%) Exposed Structure Necrotic Amount: None Present (0%) Fascia Exposed: No Fat Layer (Subcutaneous Tissue) Exposed: No Tendon Exposed: No Muscle Exposed: No Joint Exposed: No Bone Exposed: No Periwound Skin Texture Texture Color No Abnormalities Noted: No No Abnormalities Noted: No Callus: No Atrophie Blanche: No Crepitus: No Cyanosis: No Excoriation: No Ecchymosis: No Induration: No Erythema: No Rash: No Hemosiderin Staining: No Scarring: No Mottled: No Pallor: No Moisture Rubor: No No Abnormalities Noted: No Dry / Scaly:  No Joe [name] ([id number]) [phone].pdf Page 7 of 7 Maceration: No Electronic Signature(s) Signed: 02/15/2023 5:08:59 PM By: Shawn Stall RN, BSN Entered By: Shawn Stall on 02/11/2023 08:19:39 -------------------------------------------------------------------------------- Vitals Details Patient Name: Date of Service: Joe Dills, [name] Joe [name]. 02/11/2023 8:00 [name] Medical Record Number: [id number] Patient Account Number: [id number] Date of Birth/Sex: Treating RN: June 17, 1956 (67 y.o. Joe [name] Primary Care Trice Aspinall: [name] Other Clinician: Referring Colbert Curenton: Treating Suhana Wilner/Extender: Joe [name] in Treatment: 24 Vital Signs Time Taken: 08:10 Temperature (F): 98 Height (in): 67 Pulse  (bpm): 65 Weight (lbs): 158.5 Respiratory Rate (breaths/min): 16 Body Mass Index (BMI): 24.8 Blood Pressure (mmHg): 114/68 Reference Range: 80 - 120 mg / dl Airway Pulse Oximetry (%): 93 Electronic Signature(s) Signed: 02/15/2023 5:08:59 PM By: Shawn Stall RN, BSN Entered By: Shawn Stall on 02/11/2023 08:12:37

## 2023-02-17 ENCOUNTER — Other Ambulatory Visit: Payer: Medicare Other

## 2023-02-17 ENCOUNTER — Encounter (HOSPITAL_BASED_OUTPATIENT_CLINIC_OR_DEPARTMENT_OTHER): Payer: Medicare Other | Admitting: Physician Assistant

## 2023-02-18 ENCOUNTER — Encounter (HOSPITAL_BASED_OUTPATIENT_CLINIC_OR_DEPARTMENT_OTHER): Payer: Medicare Other | Admitting: Internal Medicine

## 2023-02-19 ENCOUNTER — Encounter (HOSPITAL_BASED_OUTPATIENT_CLINIC_OR_DEPARTMENT_OTHER): Payer: Medicare Other | Admitting: Internal Medicine

## 2023-02-22 ENCOUNTER — Other Ambulatory Visit (HOSPITAL_BASED_OUTPATIENT_CLINIC_OR_DEPARTMENT_OTHER): Payer: Self-pay

## 2023-02-24 ENCOUNTER — Other Ambulatory Visit: Payer: Medicare Other

## 2023-02-25 ENCOUNTER — Encounter (HOSPITAL_BASED_OUTPATIENT_CLINIC_OR_DEPARTMENT_OTHER): Payer: Medicare Other | Admitting: Internal Medicine

## 2023-03-02 ENCOUNTER — Ambulatory Visit (HOSPITAL_COMMUNITY)
Admission: RE | Admit: 2023-03-02 | Discharge: 2023-03-02 | Disposition: A | Discharge status: Home / Self Care | Payer: Medicare Other | Source: Ambulatory Visit | Attending: Internal Medicine | Admitting: Internal Medicine

## 2023-03-02 DIAGNOSIS — R609 Edema, unspecified: Secondary | ICD-10-CM | POA: Insufficient documentation

## 2023-03-02 DIAGNOSIS — I081 Rheumatic disorders of both mitral and tricuspid valves: Secondary | ICD-10-CM | POA: Diagnosis not present

## 2023-03-02 DIAGNOSIS — I110 Hypertensive heart disease with heart failure: Secondary | ICD-10-CM | POA: Diagnosis not present

## 2023-03-02 DIAGNOSIS — I509 Heart failure, unspecified: Secondary | ICD-10-CM | POA: Insufficient documentation

## 2023-03-02 DIAGNOSIS — I447 Left bundle-branch block, unspecified: Secondary | ICD-10-CM | POA: Diagnosis not present

## 2023-03-02 DIAGNOSIS — I2510 Atherosclerotic heart disease of native coronary artery without angina pectoris: Secondary | ICD-10-CM | POA: Insufficient documentation

## 2023-03-02 DIAGNOSIS — I5042 Chronic combined systolic (congestive) and diastolic (congestive) heart failure: Secondary | ICD-10-CM | POA: Diagnosis not present

## 2023-03-02 LAB — ECHOCARDIOGRAM COMPLETE
AR max vel: 3.1 cm2
AV Area VTI: 2.69 cm2
AV Area mean vel: 2.52 cm2
AV Mean grad: 3.6 mmHg
AV Peak grad: 5.9 mmHg
Ao pk vel: 1.22 m/s
Calc EF: 36.8 %
S' Lateral: 5.2 cm
Single Plane A2C EF: 39.4 %
Single Plane A4C EF: 31.8 %

## 2023-03-02 NOTE — Progress Notes (Signed)
Echocardiogram 2D Echocardiogram has been performed.  Joe [name] 03/02/2023, 3:05 PM

## 2023-03-04 ENCOUNTER — Encounter (HOSPITAL_BASED_OUTPATIENT_CLINIC_OR_DEPARTMENT_OTHER): Payer: Medicare Other | Admitting: Internal Medicine

## 2023-03-04 DIAGNOSIS — L89154 Pressure ulcer of sacral region, stage 4: Secondary | ICD-10-CM | POA: Diagnosis not present

## 2023-03-04 DIAGNOSIS — I5042 Chronic combined systolic (congestive) and diastolic (congestive) heart failure: Secondary | ICD-10-CM | POA: Diagnosis not present

## 2023-03-04 DIAGNOSIS — M4628 Osteomyelitis of vertebra, sacral and sacrococcygeal region: Secondary | ICD-10-CM

## 2023-03-04 DIAGNOSIS — Z951 Presence of aortocoronary bypass graft: Secondary | ICD-10-CM | POA: Diagnosis not present

## 2023-03-05 ENCOUNTER — Encounter (HOSPITAL_BASED_OUTPATIENT_CLINIC_OR_DEPARTMENT_OTHER): Payer: Medicare Other | Admitting: Internal Medicine

## 2023-03-05 NOTE — Progress Notes (Signed)
Magloire, Brevan A ([id number]) [id number] Page 1 of 1 Visit Report for 03/04/2023 SuperBill Details Patient Name: Date of Service: Joe [name], [place] RK A. 03/04/2023 Medical Record Number: [id number] Patient Account Number: [id number] Date of Birth/Sex: Treating RN: [birth year] (67 y.o. [name] Primary Care Provider: [name] Other Clinician: [name] Referring Provider: Treating Provider/Extender: [name] in Treatment: 27 Diagnosis Coding ICD-10 Codes Code Description L89.154 Pressure ulcer of sacral region, stage 4 I50.42 Chronic combined systolic (congestive) and diastolic (congestive) heart failure Z95.1 Presence of aortocoronary bypass graft M46.28 Osteomyelitis of vertebra, sacral and sacrococcygeal region Facility Procedures CPT4 Code Description Modifier Quantity [id number]-(Facility Use Only) HBOT full body chamber, , 5 ICD-10 Diagnosis Description M46.28 Osteomyelitis of vertebra, sacral and sacrococcygeal region L89.154 Pressure ulcer of sacral region, stage 4 I50.42 Chronic combined systolic (congestive) and diastolic (congestive) heart failure Z95.1 Presence of aortocoronary bypass graft Physician Procedures Quantity CPT4 Code Description Modifier [id number] - WC PHYS HYPERBARIC OXYGEN THERAPY 1 ICD-10 Diagnosis Description M46.28 Osteomyelitis of vertebra, sacral and sacrococcygeal region L89.154 Pressure ulcer of sacral region, stage 4 I50.42 Chronic combined systolic (congestive) and diastolic (congestive) heart failure Z95.1 Presence of aortocoronary bypass graft Electronic Signature(s) Signed: 03/04/2023 3:54:49 PM By: [name] CHT EMT BS , , Signed: 03/04/2023 4:23:19 PM By: Geralyn Corwin DO Entered By: [name] on 03/04/2023 15:54:49

## 2023-03-05 NOTE — Progress Notes (Addendum)
[name], [name] [id number]) [id number] Page 1 of 3 Visit Report for 03/04/2023 HBO Details Patient Name: Date of Service: Joe [name], [place] RK A. 03/04/2023 12:00 PM Medical Record Number: [id number] Patient Account Number: [id number] Date of Birth/Sex: Treating RN: Feb 09, 1956 (67 y.o. 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 HBO Treatment End 03/04/2023 Date: HBO Discharge Treatment Series Incomplete; Patient Choice- Social Outcome: Determinants HBO Treatment Details Treatment Number: 13 Patient Type: Outpatient Chamber Type: Monoplace Chamber Serial #: [id number] Treatment Protocol: 2.0 ATA with 90 minutes oxygen, and no air breaks Treatment Details Compression Rate Down: 1.0 psi / minute De-Compression Rate Up: 1.0 psi / minute Air breaks and breathing Decompress Decompress Compress Tx Pressure Begins Reached periods Begins Ends (leave unused spaces blank) Chamber Pressure (ATA 1 2 ------2 1 ) Clock Time (24 hr) 12:48 13:20 - - - - - - 14:50 15:09 Treatment Length: 141 (minutes) Treatment Segments: 5 Vital Signs Capillary Blood Glucose Reference Range: 80 - 120 mg / dl HBO Diabetic Blood Glucose Intervention Range: <131 mg/dl or >324 mg/dl Type: Time Vitals Blood Pulse: Respiratory Temperature: Capillary Blood Glucose Pulse Action Taken: Pressure: Rate: Glucose (mg/dl): Meter #: Oximetry (%) Taken: Pre 12:06 148/118 52 20 98.2 96 remeasured after communicating ear equalization. Pre 12:08 92/62 84 (manual BP) (manual pulse) Pre 12:27 98/62 60 (manual BP) (manual  pulse) Post 15:13 100/66 58 20 97.9 Treatment Response Treatment Toleration: Well Treatment Completion Status: Treatment Completed without Adverse Event Treatment Notes [name] arrived for wound care appointment. His blood pressure initially was 148/118 mmHg. He stated his left ear was clogged up. I educated patient on Toynbee maneuver and he demonstrated the maneuver then stated that it did work and his ear was clear. His blood pressure was remeasured at that time with a result of 92/62 mmHg with a heart rate of 84 bpm. Due to irregularity of beats blood pressure was remeasured after patient prepared for treatment. 98/62 mmHg with heart rate of 60 bpm. After performing the safety check, patient was placed in the chamber (Chamber 1, S/N [id number]) which was compressed with 100% oxygen at a rate set of 1 psi/min with the purge flow rate set at the highest setting with actual travel rate being 0.8 psi/min. I worked with [name] step-wise at every 0.5 interval until reaching treatment depth of 2 ATA. There were a few times that pressure was reversed to assist equalization, but once he understood the pattern of equalization there was no need to assist equalization. He tolerated treatment and subsequent decompression of the chamber at 1 psi/min. Patient was reminded to equalize his ears using the Toynbee maneuver that worked for him after the machine was turned off as the chamber equalized with ambient pressure as this happens at a faster rate. Patient stated his left ear was clogged upon exiting the chamber. Mr. Arcia self-administered Afrin at this time and a decongestant was recommended this evening per Dr. Mikey Bussing. He was stable upon discharge. CHAMBER 1 S/N [id number], Apparently Recording S/N after change of form, this treatment happened prior to receiving the new chamber. [name], [name] [id number]) [id number] Page 2 of 3 Tiron Suski Notes Evaluated patient's ears  prior to dive. He has no issues or  complaints. There is wax still in the left ear however no irritation noted. Tympanic membrane visualized in the right ear. No irritation noted here As well. Physician HBO Attestation: I certify that I supervised this HBO treatment in accordance with Medicare guidelines. A trained emergency response team is readily available per Yes hospital policies and procedures. Continue HBOT as ordered. Yes Electronic Signature(s) Signed: 04/07/2023 4:03:57 PM By: [name] CHT EMT BS , , Signed: 04/12/2023 2:57:08 PM By: Geralyn Corwin DO Previous Signature: 03/04/2023 4:23:19 PM Version By: Geralyn Corwin DO Previous Signature: 03/04/2023 3:54:09 PM Version By: [name] CHT EMT BS , , Previous Signature: 03/04/2023 3:51:01 PM Version By: [name] CHT EMT BS , , Entered By: [name] on 04/07/2023 16:03:57 -------------------------------------------------------------------------------- HBO Safety Checklist Details Patient Name: Date of Service: Joe Dills, [name] 03/04/2023 12:00 PM Medical Record Number: [id number] Patient Account Number: [id number] Date of Birth/Sex: Treating RN: [birth year] (67 y.o. [name] Primary Care Evgenia Merriman: [name] Other Clinician: [name] Referring Dekayla Prestridge: Treating Foch Rosenwald/Extender: [name] in Treatment: 27 HBO Safety Checklist Items Safety Checklist Consent Form Signed Patient voided / foley secured and emptied When did you last eato Breakfast Last dose of injectable or oral agent n/a Ostomy pouch emptied and vented if applicable NA All implantable devices assessed, documented and approved NA Intravenous access site secured and place NA Valuables secured Linens and cotton and cotton/polyester blend (less than 51% polyester) Personal oil-based products / skin lotions / body lotions removed Wigs or hairpieces  removed NA Smoking or tobacco materials removed NA Books / newspapers / magazines / loose paper removed Cologne, aftershave, perfume and deodorant removed Jewelry removed (may wrap wedding band) Make-up removed NA Hair care products removed Battery operated devices (external) removed Heating patches and chemical warmers removed Titanium eyewear removed Nail polish cured greater than 10 hours NA Casting material cured greater than 10 hours NA Hearing aids removed NA Loose dentures or partials removed NA Prosthetics have been removed NA Patient demonstrates correct use of air break device (if applicable) Patient concerns have been addressed Patient grounding bracelet on and cord attached to chamber Specifics for Inpatients (complete in addition to above) Medication sheet sent with patient NA Intravenous medications needed or due during therapy sent with patient NA Drainage tubes (e.g. nasogastric tube or chest tube secured and vented) NA Endotracheal or Tracheotomy tube secured NA Cuff deflated of air and inflated with saline NA River, Raijon A ([id number]) [id number] Page 3 of 3 Airway suctioned NA Notes Paper version used prior to treatment start. Electronic Signature(s) Signed: 03/04/2023 2:39:42 PM By: [name] CHT EMT BS , , Entered By: [name] on 03/04/2023 14:39:42

## 2023-03-09 ENCOUNTER — Encounter (HOSPITAL_BASED_OUTPATIENT_CLINIC_OR_DEPARTMENT_OTHER): Payer: Medicare Other | Admitting: Internal Medicine

## 2023-03-09 ENCOUNTER — Other Ambulatory Visit (HOSPITAL_BASED_OUTPATIENT_CLINIC_OR_DEPARTMENT_OTHER): Payer: Self-pay

## 2023-03-09 NOTE — Progress Notes (Unsigned)
  Electrophysiology Office Follow up Visit Note:    Date:  03/09/2023   ID:  Joe [name], DOB March 21, 1956, MRN [id number]  PCP:  Andi Devon, MD  [facility] Cardiologist:  Chilton Si, MD  [facility] Electrophysiologist:  Lanier Prude, MD    Interval History:    Joe [name] is a 66 y.o. male who presents for a follow up visit.   I saw him last in late 2023. He has a complex medical history including CAD s/p CABG, AVR, Ao root repair, LV epicardial lead placement. He had a very complicated hospital course after his surgery. We planned for ICD and RA lead addition to the epicardial LV lead.   He last saw Dr [name] 01/12/2023. At that appointment he was improving but still was dealing with a sacaral wound. He was planning to start hypeprbaric treatment for this.   In the past, CRT upgrade was deferred due to poorly healing wound.   He takes eliquis for stroke ppx for his AF/AFL.     Past medical, surgical, social and family history were reviewed.  ROS:   Please see the history of present illness.    All other systems reviewed and are negative.  EKGs/Labs/Other Studies Reviewed:    The following studies were reviewed today:  03/02/2023 Echo EF 20-25% RV moderately reduced Mild MR    EKG:  The ekg ordered today demonstrates ***   Physical Exam:    VS:  There were no vitals taken for this visit.    Wt Readings from Last 3 Encounters:  01/12/23 166 lb 9.6 oz (75.6 kg)  12/21/22 166 lb (75.3 kg)  12/01/22 159 lb (72.1 kg)     GEN: *** Well nourished, well developed in no acute distress CARDIAC: ***RRR, no murmurs, rubs, gallops RESPIRATORY:  Clear to auscultation without rales, wheezing or rhonchi       ASSESSMENT:    No diagnosis found. PLAN:    In order of problems listed above:   #Chronic systolic HF #LBBB NYHA III. Warm and slightly volume up on exam***. Follows with HF clinic.   Discussed role of CRT in treating his HF  and protecting against SCD. We discussed procedure and risks. We discussed how his ongoing sacral wounds put him at prohibitive risk for device implantation.   Once his sacral wounds heal, can proceed with RA/RV lead addition to his epicardial LV lead.   #AF On eliquis for stroke ppx.  Follow up 6 months with EP APP.        Signed, Steffanie Dunn, MD, [facility], [facility] 03/09/2023 9:33 PM    Electrophysiology [facility]

## 2023-03-10 ENCOUNTER — Ambulatory Visit: Payer: Medicare Other | Attending: Cardiology | Admitting: Cardiology

## 2023-03-10 ENCOUNTER — Encounter: Payer: Self-pay | Admitting: Cardiology

## 2023-03-10 VITALS — BP 116/74 | HR 64 | Ht 67.0 in | Wt 163.0 lb

## 2023-03-10 DIAGNOSIS — S31000D Unspecified open wound of lower back and pelvis without penetration into retroperitoneum, subsequent encounter: Secondary | ICD-10-CM

## 2023-03-10 DIAGNOSIS — I447 Left bundle-branch block, unspecified: Secondary | ICD-10-CM

## 2023-03-10 DIAGNOSIS — I480 Paroxysmal atrial fibrillation: Secondary | ICD-10-CM

## 2023-03-10 DIAGNOSIS — I5042 Chronic combined systolic (congestive) and diastolic (congestive) heart failure: Secondary | ICD-10-CM

## 2023-03-10 NOTE — Patient Instructions (Signed)
Medication Instructions:  Your physician recommends that you continue on your current medications as directed. Please refer to the Current Medication list given to you today.  *If you need a refill on your cardiac medications before your next appointment, please call your pharmacy*  Lab Work: BMET and CBC on 6/26 between 7:30am and 4:30pm at [address], [address]. You DO NOT need to be fasting.  Testing/Procedures: Your physician has recommended that you have a defibrillator inserted. An implantable cardioverter defibrillator (ICD) is a small device that is placed in your chest or, in rare cases, your abdomen. This device uses electrical pulses or shocks to help control life-threatening, irregular heartbeats that could lead the heart to suddenly stop beating (sudden cardiac arrest). Leads are attached to the ICD that goes into your heart. This is done in the hospital and usually requires an overnight stay. Please see the instruction sheet given to you today for more information.  You are scheduled for a device implant on Tuesday, July 9 with Dr. Steffanie Dunn.Please arrive at the Main Entrance A at [facility]: [address] [place], [place] [address] at 1:30 PM    Follow-Up: At [facility], you and your health needs are our priority.  As part of our continuing mission to provide you with exceptional heart care, we have created designated Provider Care Teams.  These Care Teams include your primary Cardiologist (physician) and Advanced Practice Providers (APPs -  Physician Assistants and Nurse Practitioners) who all work together to provide you with the care you need, when you need it.  Your next appointment:   We will call you to schedule your follow up appointment.

## 2023-03-10 NOTE — Progress Notes (Signed)
Electrophysiology Office Follow up Visit Note:    Date:  03/10/2023   ID:  Joe [name], DOB November 23, 1955, MRN [id number]  PCP:  Andi Devon, MD  [facility] Cardiologist:  Chilton Si, MD  [facility] Electrophysiologist:  Lanier Prude, MD    Interval History:    Joe [name] is a 67 y.o. male who presents for a follow up visit.   I saw him last in late 2023. He has a complex medical history including CAD s/p CABG, AVR, Ao root repair, LV epicardial lead placement. He had a very complicated hospital course after his surgery. We planned for ICD and RA lead addition to the epicardial LV lead.   He last saw Dr Joe [name] 01/12/2023. At that appointment he was improving but still was dealing with a sacaral wound. He was planning to start hypeprbaric treatment for this.   In the past, CRT upgrade was deferred due to poorly healing wound.   He takes eliquis for stroke ppx for his AF/AFL.  Today, he is not feeling too good.  He has chest pain with deep breathing and palpation in his chest wall.  He has also noted occasional "popping" in his neck and shoulders. He denies significant shortness of breath or palpitations.  They report that his wound has now closed with his hyperbaric therapy. There are about 8 more potential treatment days; the sessions last for 90 minutes.  Additionally he complains of some abdominal muscle cramping.  At times he complains of headaches. These may be related to his known environmental allergies.  His weight has been stable at home. Sometimes he doesn't sleep well.  He denies any peripheral edema, lightheadedness, syncope, orthopnea, or PND.      Past medical, surgical, social and family history were reviewed.  ROS:   Please see the history of present illness.    (+) Chest tightness (+) Headaches (+) Environmental allergies (+) Abdominal muscle cramps All other systems reviewed and are negative.  EKGs/Labs/Other Studies  Reviewed:    The following studies were reviewed today:  03/02/2023 Echo EF 20-25% RV moderately reduced Mild MR   December 01, 2022 EKG shows a wide left bundle branch block, junctional rhythm    Physical Exam:    VS:  BP 116/74   Pulse 64   Ht 5\' 7"  (1.702 m)   Wt 163 lb (73.9 kg)   SpO2 97%   BMI 25.53 kg/m     Wt Readings from Last 3 Encounters:  03/10/23 163 lb (73.9 kg)  01/12/23 166 lb 9.6 oz (75.6 kg)  12/21/22 166 lb (75.3 kg)     GEN:  Well nourished, well developed in no acute distress CARDIAC: RRR, no murmurs, rubs, gallops. Sternotomy incision well healed. MUSC: Tenderness of chest wall on palpation. RESPIRATORY:  Clear to auscultation without rales, wheezing or rhonchi       ASSESSMENT:    1. Chronic combined systolic and diastolic heart failure (HCC)   2. LBBB (left bundle branch block)   3. Wound of sacral region, subsequent encounter   4. Paroxysmal A-fib (HCC)    PLAN:    In order of problems listed above:   #Chronic systolic HF #LBBB NYHA III. Warm and euvolemic. Follows with HF clinic.   Discussed role of CRT in treating his HF and protecting against SCD. We discussed procedure and risks.  His wounds have now healed with the assistance of hyperbaric oxygen therapy.  Given that they have healed, can now proceed  with implant of his CRT D.  We will hook up a generator to his previously placed epicardial LV lead and I will add a transvenous RV and RA lead.  I discussed the procedure in detail including the risks and he wishes to proceed.  -----------  The patient has a mostly nonischemic CM (EF 25%), NYHA Class III CHF, and CAD.  He is referred by Dr Joe [name] for risk stratification of sudden death and consideration of ICD implantation.  At this time, he meets criteria for ICD implantation for primary prevention of sudden death.  I have had a thorough discussion with the patient reviewing options.  The patient and their family (if available)  have had opportunities to ask questions and have them answered. The patient and I have decided together through a shared decision making process to proceed with ICD implant at this time.    Risks, benefits, alternatives to ICD implantation were discussed in detail with the patient today. The patient understands that the risks include but are not limited to bleeding, infection, pneumothorax, perforation, tamponade, vascular damage, renal failure, MI, stroke, death, inappropriate shocks, and lead dislodgement and wishes to proceed.  We will therefore schedule device implantation at the next available time.  I would like to hold his Eliquis for 48 hours prior to the procedure to minimize the risk of pocket hematoma/infection.  I will reach out to his primary cardiologist, Dr. Gala [name] to confirm that this is a reasonable plan given the presence of a bioprosthetic aortic valve.  #AF On eliquis for stroke ppx.  Plan for 48-hour hold prior to device implant to minimize pocket hematoma.  I,Mathew Stumpf,acting as a Neurosurgeon for Lanier Prude, MD.,have documented all relevant documentation on the behalf of Lanier Prude, MD,as directed by  Lanier Prude, MD while in the presence of Lanier Prude, MD.  I, Lanier Prude, MD, have reviewed all documentation for this visit. The documentation on 03/10/23 for the exam, diagnosis, procedures, and orders are all accurate and complete.   Signed, Steffanie Dunn, MD, [facility], [facility] 03/10/2023 2:43 PM    Electrophysiology [facility]

## 2023-03-11 ENCOUNTER — Encounter (HOSPITAL_BASED_OUTPATIENT_CLINIC_OR_DEPARTMENT_OTHER): Payer: Medicare Other | Admitting: Internal Medicine

## 2023-03-15 ENCOUNTER — Encounter (HOSPITAL_BASED_OUTPATIENT_CLINIC_OR_DEPARTMENT_OTHER): Payer: Medicare Other | Admitting: Internal Medicine

## 2023-03-16 ENCOUNTER — Encounter (HOSPITAL_BASED_OUTPATIENT_CLINIC_OR_DEPARTMENT_OTHER): Payer: Medicare Other | Admitting: Internal Medicine

## 2023-03-22 ENCOUNTER — Encounter (HOSPITAL_BASED_OUTPATIENT_CLINIC_OR_DEPARTMENT_OTHER): Payer: Medicare Other | Attending: Internal Medicine | Admitting: Internal Medicine

## 2023-03-22 DIAGNOSIS — Z951 Presence of aortocoronary bypass graft: Secondary | ICD-10-CM | POA: Insufficient documentation

## 2023-03-22 DIAGNOSIS — L89154 Pressure ulcer of sacral region, stage 4: Secondary | ICD-10-CM | POA: Diagnosis present

## 2023-03-22 DIAGNOSIS — I5042 Chronic combined systolic (congestive) and diastolic (congestive) heart failure: Secondary | ICD-10-CM | POA: Insufficient documentation

## 2023-03-23 ENCOUNTER — Encounter (HOSPITAL_BASED_OUTPATIENT_CLINIC_OR_DEPARTMENT_OTHER): Payer: Medicare Other | Admitting: Internal Medicine

## 2023-03-24 ENCOUNTER — Encounter (HOSPITAL_BASED_OUTPATIENT_CLINIC_OR_DEPARTMENT_OTHER): Payer: Medicare Other | Admitting: Internal Medicine

## 2023-03-24 NOTE — Progress Notes (Signed)
Matkins, Luiscarlos A [phone] [id number]) [id number] Page 1 of 1 Visit Report for 03/22/2023 SuperBill Details Patient Name: Date of Service: Theo Dills, [place] RK A. 03/22/2023 Medical Record Number: [id number] Patient Account Number: [id number] Date of Birth/Sex: Treating RN: 21-Dec-1955 (67 y.o. [name] Primary Care Provider: [name] Other Clinician: [name] Referring Provider: Treating Provider/Extender: [name] in Treatment: 29 Diagnosis Coding ICD-10 Codes Code Description [phone] Pressure ulcer of sacral region, stage 4 I50.42 Chronic combined systolic (congestive) and diastolic (congestive) heart failure Z95.1 Presence of aortocoronary bypass graft M46.28 Osteomyelitis of vertebra, sacral and sacrococcygeal region Facility Procedures CPT4 Code Description Modifier Quantity [id number] [phone] - WOUND CARE VISIT-LEV 1 EST PT 1 Electronic Signature(s) Signed: 03/22/2023 5:15:08 PM By: [name] CHT EMT BS , , Signed: 03/23/2023 3:34:20 PM By: Geralyn Corwin DO Previous Signature: 03/22/2023 5:14:56 PM Version By: [name] CHT EMT BS , , Entered By: [name] on 03/22/2023 17:15:08

## 2023-03-25 ENCOUNTER — Encounter (HOSPITAL_BASED_OUTPATIENT_CLINIC_OR_DEPARTMENT_OTHER): Payer: Medicare Other | Admitting: Internal Medicine

## 2023-03-26 ENCOUNTER — Encounter (HOSPITAL_BASED_OUTPATIENT_CLINIC_OR_DEPARTMENT_OTHER): Payer: Medicare Other | Admitting: Internal Medicine

## 2023-04-01 ENCOUNTER — Other Ambulatory Visit (HOSPITAL_BASED_OUTPATIENT_CLINIC_OR_DEPARTMENT_OTHER): Payer: Self-pay

## 2023-04-07 ENCOUNTER — Ambulatory Visit: Payer: Medicare Other | Attending: Cardiology

## 2023-04-07 DIAGNOSIS — I480 Paroxysmal atrial fibrillation: Secondary | ICD-10-CM

## 2023-04-07 DIAGNOSIS — I447 Left bundle-branch block, unspecified: Secondary | ICD-10-CM

## 2023-04-07 DIAGNOSIS — I5042 Chronic combined systolic (congestive) and diastolic (congestive) heart failure: Secondary | ICD-10-CM

## 2023-04-07 DIAGNOSIS — S31000D Unspecified open wound of lower back and pelvis without penetration into retroperitoneum, subsequent encounter: Secondary | ICD-10-CM

## 2023-04-08 LAB — CBC WITH DIFFERENTIAL/PLATELET
Basophils Absolute: 0 10*3/uL (ref 0.0–0.2)
Basos: 0 %
EOS (ABSOLUTE): 0 10*3/uL (ref 0.0–0.4)
Eos: 0 %
Hematocrit: 39 % (ref 37.5–51.0)
Hemoglobin: 13.5 g/dL (ref 13.0–17.7)
Immature Grans (Abs): 0 10*3/uL (ref 0.0–0.1)
Immature Granulocytes: 0 %
Lymphocytes Absolute: 1.2 10*3/uL (ref 0.7–3.1)
Lymphs: 17 %
MCH: 34.6 pg — ABNORMAL HIGH (ref 26.6–33.0)
MCHC: 34.6 g/dL (ref 31.5–35.7)
MCV: 100 fL — ABNORMAL HIGH (ref 79–97)
Monocytes Absolute: 0.6 10*3/uL (ref 0.1–0.9)
Monocytes: 9 %
Neutrophils Absolute: 5 10*3/uL (ref 1.4–7.0)
Neutrophils: 74 %
Platelets: 174 10*3/uL (ref 150–450)
RBC: 3.9 x10E6/uL — ABNORMAL LOW (ref 4.14–5.80)
RDW: 14.8 % (ref 11.6–15.4)
WBC: 6.9 10*3/uL (ref 3.4–10.8)

## 2023-04-08 LAB — BASIC METABOLIC PANEL
BUN/Creatinine Ratio: 22 (ref 10–24)
BUN: 17 mg/dL (ref 8–27)
CO2: 22 mmol/L (ref 20–29)
Calcium: 9.8 mg/dL (ref 8.6–10.2)
Chloride: 105 mmol/L (ref 96–106)
Creatinine, Ser: 0.79 mg/dL (ref 0.76–1.27)
Glucose: 108 mg/dL — ABNORMAL HIGH (ref 70–99)
Potassium: 4.3 mmol/L (ref 3.5–5.2)
Sodium: 140 mmol/L (ref 134–144)
eGFR: 97 mL/min/{1.73_m2} (ref >59)

## 2023-04-12 NOTE — Progress Notes (Signed)
Dockter, Mekhai [name] [phone] [id number]) [phone].pdf Page 1 of 2 Visit Report for 01/27/2023 HBO Details Patient Name: Date of Service: Joe [name], Joe [name] [name]. 01/27/2023 8:00 [name] Medical Record Number: [id number] Patient Account Number: [id number] Date of Birth/Sex: Treating RN: Jun 03, 1956 (67 y.o. 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 HBO Treatment Details Treatment Number: 6 Patient Type: Outpatient Chamber Type: Monoplace Chamber Serial #: [id number] Treatment Protocol: 2.0 ATA with 90 minutes oxygen, and no air breaks Treatment Details Compression Rate Down: 1.0 psi / minute De-Compression Rate Up: 1.5 psi / minute Air breaks and breathing Decompress Decompress Compress Tx Pressure Begins Reached periods Begins Ends (leave unused spaces blank) Chamber Pressure (ATA 1 2 ------2 1 ) Clock Time (24 hr) 07:51 08:05 - - - - - - 09:35 09:43 Treatment Length: 112 (minutes) Treatment Segments: 4 Vital Signs Capillary Blood Glucose Reference Range: 80 - 120 mg / dl HBO Diabetic Blood Glucose Intervention Range: <131 mg/dl or >638 mg/dl Type: Time Vitals Blood Pulse: Respiratory Temperature: Capillary Blood Glucose Pulse Action Taken: Pressure: Rate: Glucose (mg/dl): Meter #: Oximetry (%) Taken: Pre 07:44 106/72 66 18 97.9 Post 09:46 114/70 43 18 97.3 asymptomatic for bradycardia Treatment Response Treatment Toleration: Well Treatment Completion Status: Treatment Completed without Adverse Event Treatment Notes [name] arrived and prepared for treatment. His vital signs were normal prior  to treatment. After performing safety check, patient was placed in the chamber which was compressed with 100% oxygen at [name] rate of 1 psi/min. He tolerated the treatment and subsequent decompression at [name] rate of approximately 1.8 psi/min. He denied any issues with ear equalization and/or pain. [name] post-treatment heart rate was 43 bpm. He denied any symptoms related to bradycardia. Patient was stable upon discharge. Electronic Signature(s) Signed: 01/28/2023 12:59:43 PM By: Joe [name] CHT EMT BS , , Signed: 04/12/2023 3:13:10 PM By: Joe Derry PA-C Previous Signature: 01/27/2023 6:42:55 PM Version By: Joe Derry PA-C Entered By: Joe [name] on 01/28/2023 12:59:43 Joe [name] ([id number]) [id number] Page 2 of 2 -------------------------------------------------------------------------------- HBO Safety Checklist Details Patient Name: Date of Service: Joe [name], Joe [name] [name]. 01/27/2023 8:00 [name] Medical Record Number: [id number] Patient Account Number: [id number] Date of Birth/Sex: Treating RN: Jul 26, 1956 (67 y.o. Joe [name], Joe [name] Primary Care Joe [name]: Joe [name] Other Clinician: Haywood [name] Referring Joe [name]: Treating Joe [name]/Extender: Joe [name] in Treatment: 21 HBO Safety Checklist Items Safety Checklist Consent Form Signed Patient voided / foley secured and emptied When did you last eato Breakfast Last dose of injectable or oral agent n/[name] Ostomy pouch emptied and vented if applicable NA All implantable devices assessed, documented and approved NA Intravenous access site secured and place NA Valuables secured Linens and cotton and cotton/polyester blend (less than 51% polyester) Personal oil-based products / skin lotions / body lotions removed Wigs or hairpieces removed NA Smoking or tobacco materials removed NA Books / newspapers / magazines / loose paper removed Cologne, aftershave,  perfume and deodorant removed Jewelry removed (may wrap wedding band) Make-up removed NA Hair care products removed Battery operated devices (external) removed Heating patches and chemical warmers removed Titanium eyewear removed Nail polish cured greater than 10 hours Casting material cured greater than  10 hours NA Hearing aids removed Loose dentures or partials removed NA Prosthetics have been removed NA Patient demonstrates correct use of air break device (if applicable) Patient concerns have been addressed Patient grounding bracelet on and cord attached to chamber Specifics for Inpatients (complete in addition to above) Medication sheet sent with patient NA Intravenous medications needed or due during therapy sent with patient NA Drainage tubes (e.g. nasogastric tube or chest tube secured and vented) NA Endotracheal or Tracheotomy tube secured NA Cuff deflated of air and inflated with saline NA Airway suctioned NA Notes Paper version used prior to treatment start. Electronic Signature(s) Signed: 01/28/2023 3:05:40 PM By: Joe [name] CHT EMT BS , , Entered By: Joe [name] on 01/27/2023 10:26:03

## 2023-04-14 ENCOUNTER — Other Ambulatory Visit (HOSPITAL_COMMUNITY): Payer: Self-pay | Admitting: Family Medicine

## 2023-04-14 ENCOUNTER — Other Ambulatory Visit (HOSPITAL_BASED_OUTPATIENT_CLINIC_OR_DEPARTMENT_OTHER): Payer: Self-pay

## 2023-04-14 MED ORDER — ENTRESTO 24-26 MG PO TABS
1.0000 | ORAL_TABLET | Freq: Two times a day (BID) | ORAL | 3 refills | Status: DC
  Filled 2023-04-14: qty 60, 30d supply, fill #0
  Filled 2023-05-10: qty 60, 30d supply, fill #1
  Filled 2023-06-07: qty 60, 30d supply, fill #2
  Filled 2023-07-10: qty 60, 30d supply, fill #3

## 2023-04-14 MED ORDER — TAMSULOSIN HCL 0.4 MG PO CAPS
0.4000 mg | ORAL_CAPSULE | Freq: Two times a day (BID) | ORAL | 3 refills | Status: DC
  Filled 2023-04-14: qty 60, 30d supply, fill #0
  Filled 2023-05-10: qty 60, 30d supply, fill #1
  Filled 2023-06-07: qty 60, 30d supply, fill #2
  Filled 2023-07-10: qty 60, 30d supply, fill #3

## 2023-04-19 NOTE — Pre-Procedure Instructions (Signed)
Attempted to call patient regarding procedure instructions.  Left voicemail on the following items: Arrival time 1:00 pm  Nothing to eat or drink after midnight No meds AM of procedure Responsible person to drive you home and stay with you for 24 hrs Wash with special soap night before and morning of procedure

## 2023-04-20 ENCOUNTER — Ambulatory Visit (HOSPITAL_COMMUNITY)
Admission: RE | Admit: 2023-04-20 | Discharge: 2023-04-20 | Disposition: A | Discharge status: Home / Self Care | Payer: Medicare Other | Attending: Cardiology | Admitting: Cardiology

## 2023-04-20 ENCOUNTER — Encounter (HOSPITAL_COMMUNITY)
Admission: RE | Disposition: A | Discharge status: Home / Self Care | Payer: Self-pay | Source: Home / Self Care | Attending: Cardiology

## 2023-04-20 DIAGNOSIS — I2510 Atherosclerotic heart disease of native coronary artery without angina pectoris: Secondary | ICD-10-CM | POA: Diagnosis present

## 2023-04-20 DIAGNOSIS — I480 Paroxysmal atrial fibrillation: Secondary | ICD-10-CM | POA: Diagnosis not present

## 2023-04-20 DIAGNOSIS — I447 Left bundle-branch block, unspecified: Secondary | ICD-10-CM | POA: Diagnosis not present

## 2023-04-20 DIAGNOSIS — Z951 Presence of aortocoronary bypass graft: Secondary | ICD-10-CM | POA: Diagnosis not present

## 2023-04-20 DIAGNOSIS — I5022 Chronic systolic (congestive) heart failure: Secondary | ICD-10-CM | POA: Insufficient documentation

## 2023-04-20 DIAGNOSIS — Z7901 Long term (current) use of anticoagulants: Secondary | ICD-10-CM | POA: Diagnosis not present

## 2023-04-20 DIAGNOSIS — Z539 Procedure and treatment not carried out, unspecified reason: Secondary | ICD-10-CM | POA: Insufficient documentation

## 2023-04-20 SURGERY — ICD IMPLANT

## 2023-04-20 MED ORDER — POVIDONE-IODINE 10 % EX SWAB
2.0000 | Freq: Once | CUTANEOUS | Status: AC
  Administered 2023-04-20: 2 via TOPICAL

## 2023-04-20 MED ORDER — SODIUM CHLORIDE 0.9 % IV SOLN: 80.0000 mg | INTRAVENOUS | Status: DC

## 2023-04-20 MED ORDER — CEFAZOLIN SODIUM-DEXTROSE 2-4 GM/100ML-% IV SOLN: 2.0000 g | INTRAVENOUS | Status: DC

## 2023-04-20 MED ORDER — CHLORHEXIDINE GLUCONATE 4 % EX SOLN: 4.0000 | Freq: Once | CUTANEOUS | Status: DC

## 2023-04-20 MED ORDER — SODIUM CHLORIDE 0.9 % IV SOLN: INTRAVENOUS | Status: DC

## 2023-04-20 NOTE — H&P (Signed)
Electrophysiology Office Follow up Visit Note:     Date:  04/20/2023    ID:  Joe [name], DOB [birth year], MRN [id number]   PCP:  Andi Devon, MD  [facility] Cardiologist:  Chilton Si, MD  [facility] Electrophysiologist:  Lanier Prude, MD      Interval History:     Joe [name] is a 67 y.o. male who presents for a follow up visit.    I saw him last in late 2023. He has a complex medical history including CAD s/p CABG, AVR, Ao root repair, LV epicardial lead placement. He had a very complicated hospital course after his surgery. We planned for ICD and RA lead addition to the epicardial LV lead.    He last saw Dr [name] 01/12/2023. At that appointment he was improving but still was dealing with a sacaral wound. He was planning to start hypeprbaric treatment for this.    In the past, CRT upgrade was deferred due to poorly healing wound.    He takes eliquis for stroke ppx for his AF/AFL.   Today, he is not feeling too good.  He has chest pain with deep breathing and palpation in his chest wall.  He has also noted occasional "popping" in his neck and shoulders. He denies significant shortness of breath or palpitations.   They report that his wound has now closed with his hyperbaric therapy. There are about 8 more potential treatment days; the sessions last for 90 minutes.   Additionally he complains of some abdominal muscle cramping.   At times he complains of headaches. These may be related to his known environmental allergies.   His weight has been stable at home. Sometimes he doesn't sleep well.   He denies any peripheral edema, lightheadedness, syncope, orthopnea, or PND.     Presents for ICD implant today.   Objective  Past medical, surgical, social and family history were reviewed.   ROS:   Please see the history of present illness.    (+) Chest tightness (+) Headaches (+) Environmental allergies (+) Abdominal muscle cramps All other  systems reviewed and are negative.   EKGs/Labs/Other Studies Reviewed:     The following studies were reviewed today:   03/02/2023 Echo EF 20-25% RV moderately reduced Mild MR     December 01, 2022 EKG shows a wide left bundle branch block, junctional rhythm       Physical Exam:     VS:  BP 132/80   Pulse 54   Ht 5\' 7"  (1.702 m)   Wt 163 lb (73.9 kg)   SpO2 97%   BMI 25.53 kg/m         Wt Readings from Last 3 Encounters:  03/10/23 163 lb (73.9 kg)  01/12/23 166 lb 9.6 oz (75.6 kg)  12/21/22 166 lb (75.3 kg)      GEN:  Well nourished, well developed in no acute distress CARDIAC: RRR, no murmurs, rubs, gallops. Sternotomy incision well healed. MUSC: Tenderness of chest wall on palpation. RESPIRATORY:  Clear to auscultation without rales, wheezing or rhonchi          Assessment ASSESSMENT:     1. Chronic combined systolic and diastolic heart failure (HCC)   2. LBBB (left bundle branch block)   3. Wound of sacral region, subsequent encounter   4. Paroxysmal A-fib (HCC)     PLAN:     In order of problems listed above:     #Chronic systolic HF #LBBB NYHA III.  Warm and euvolemic. Follows with HF clinic.    Discussed role of CRT in treating his HF and protecting against SCD. We discussed procedure and risks.  His wounds have now healed with the assistance of hyperbaric oxygen therapy.  Given that they have healed, can now proceed with implant of his CRT D.  We will hook up a generator to his previously placed epicardial LV lead and I will add a transvenous RV and RA lead.  I discussed the procedure in detail including the risks and he wishes to proceed.   -----------   The patient has a mostly nonischemic CM (EF 25%), NYHA Class III CHF, and CAD.  He is referred by Dr [name] for risk stratification of sudden death and consideration of ICD implantation.  At this time, he meets criteria for ICD implantation for primary prevention of sudden death.  I have had a  thorough discussion with the patient reviewing options.  The patient and their family (if available) have had opportunities to ask questions and have them answered. The patient and I have decided together through a shared decision making process to proceed with ICD implant at this time.     Risks, benefits, alternatives to ICD implantation were discussed in detail with the patient today. The patient understands that the risks include but are not limited to bleeding, infection, pneumothorax, perforation, tamponade, vascular damage, renal failure, MI, stroke, death, inappropriate shocks, and lead dislodgement and wishes to proceed.  We will therefore schedule device implantation at the next available time.   I would like to hold his Eliquis for 48 hours prior to the procedure to minimize the risk of pocket hematoma/infection.  I will reach out to his primary cardiologist, Dr. [name] to confirm that this is a reasonable plan given the presence of a bioprosthetic aortic valve.   #AF On eliquis for stroke ppx.  Plan for 48-hour hold prior to device implant to minimize pocket hematoma.   Presents for ICD implant today. Procedure reviewed.     Signed, Steffanie Dunn, MD, [facility], [facility] 04/20/2023 Electrophysiology [facility]

## 2023-04-21 ENCOUNTER — Other Ambulatory Visit (HOSPITAL_BASED_OUTPATIENT_CLINIC_OR_DEPARTMENT_OTHER): Payer: Self-pay

## 2023-04-21 MED ORDER — PANTOPRAZOLE SODIUM 40 MG PO TBEC
40.0000 mg | DELAYED_RELEASE_TABLET | Freq: Every day | ORAL | 3 refills | Status: DC | PRN
  Filled 2023-04-21: qty 30, 30d supply, fill #0
  Filled 2023-11-01: qty 30, 30d supply, fill #1
  Filled 2023-12-27: qty 30, 30d supply, fill #2
  Filled 2024-03-14: qty 30, 30d supply, fill #3

## 2023-04-29 NOTE — Pre-Procedure Instructions (Signed)
Attempted to call patient regarding procedure instructions for tomorrow.  Left voice mail on the following.  Arrival time is 1:30.  Left voice mail on the following items: Arrival time 1:30 Light breakfast  before 8am No meds AM of procedure Responsible person to drive you home and stay with you for 24 hrs Wash with special soap night before and morning of procedure If on anti-coagulant drug instructions Eliquis- last dose should have been on

## 2023-04-30 ENCOUNTER — Telehealth: Payer: Self-pay

## 2023-04-30 DIAGNOSIS — I5042 Chronic combined systolic (congestive) and diastolic (congestive) heart failure: Secondary | ICD-10-CM

## 2023-04-30 NOTE — Pre-Procedure Instructions (Signed)
Spoke with patient's mother Liston Alba.  Patient's procedure has been canceled for today.  Office will call to reschedule.

## 2023-04-30 NOTE — Telephone Encounter (Signed)
Due to outage, pt's ICD Implant has been moved from 7/19 to 05/28/23 at 3:30 PM.   Lab appt: 05/11/23...  I will mail updated instruction letter to pt.

## 2023-05-05 ENCOUNTER — Ambulatory Visit: Payer: Medicare Other

## 2023-05-06 ENCOUNTER — Other Ambulatory Visit (HOSPITAL_BASED_OUTPATIENT_CLINIC_OR_DEPARTMENT_OTHER): Payer: Self-pay

## 2023-05-10 ENCOUNTER — Other Ambulatory Visit (HOSPITAL_BASED_OUTPATIENT_CLINIC_OR_DEPARTMENT_OTHER): Payer: Self-pay

## 2023-05-11 ENCOUNTER — Ambulatory Visit: Payer: Medicare Other | Attending: Cardiology

## 2023-05-11 DIAGNOSIS — I5042 Chronic combined systolic (congestive) and diastolic (congestive) heart failure: Secondary | ICD-10-CM

## 2023-05-11 LAB — BASIC METABOLIC PANEL
BUN/Creatinine Ratio: 19 (ref 10–24)
BUN: 14 mg/dL (ref 8–27)
CO2: 21 mmol/L (ref 20–29)
Calcium: 9.5 mg/dL (ref 8.6–10.2)
Chloride: 104 mmol/L (ref 96–106)
Creatinine, Ser: 0.75 mg/dL — ABNORMAL LOW (ref 0.76–1.27)
Glucose: 113 mg/dL — ABNORMAL HIGH (ref 70–99)
Potassium: 4.2 mmol/L (ref 3.5–5.2)
Sodium: 140 mmol/L (ref 134–144)
eGFR: 98 mL/min/{1.73_m2} (ref >59)

## 2023-05-11 LAB — CBC

## 2023-05-19 ENCOUNTER — Ambulatory Visit: Payer: Medicare Other

## 2023-05-28 ENCOUNTER — Ambulatory Visit (HOSPITAL_COMMUNITY)
Admission: RE | Admit: 2023-05-28 | Discharge: 2023-05-29 | Disposition: A | Discharge status: Home / Self Care | Payer: Medicare Other | Attending: Cardiology | Admitting: Cardiology

## 2023-05-28 ENCOUNTER — Ambulatory Visit (HOSPITAL_COMMUNITY)
Admission: RE | Disposition: A | Discharge status: Home / Self Care | Payer: Self-pay | Source: Home / Self Care | Attending: Cardiology

## 2023-05-28 DIAGNOSIS — I110 Hypertensive heart disease with heart failure: Secondary | ICD-10-CM | POA: Diagnosis not present

## 2023-05-28 DIAGNOSIS — Z7901 Long term (current) use of anticoagulants: Secondary | ICD-10-CM | POA: Insufficient documentation

## 2023-05-28 DIAGNOSIS — I255 Ischemic cardiomyopathy: Secondary | ICD-10-CM | POA: Diagnosis present

## 2023-05-28 DIAGNOSIS — Z951 Presence of aortocoronary bypass graft: Secondary | ICD-10-CM | POA: Insufficient documentation

## 2023-05-28 DIAGNOSIS — I480 Paroxysmal atrial fibrillation: Secondary | ICD-10-CM | POA: Diagnosis not present

## 2023-05-28 DIAGNOSIS — I447 Left bundle-branch block, unspecified: Secondary | ICD-10-CM | POA: Diagnosis present

## 2023-05-28 DIAGNOSIS — Z95810 Presence of automatic (implantable) cardiac defibrillator: Secondary | ICD-10-CM | POA: Diagnosis present

## 2023-05-28 DIAGNOSIS — I5022 Chronic systolic (congestive) heart failure: Secondary | ICD-10-CM | POA: Diagnosis not present

## 2023-05-28 DIAGNOSIS — I5042 Chronic combined systolic (congestive) and diastolic (congestive) heart failure: Secondary | ICD-10-CM | POA: Diagnosis present

## 2023-05-28 DIAGNOSIS — Z953 Presence of xenogenic heart valve: Secondary | ICD-10-CM | POA: Diagnosis not present

## 2023-05-28 DIAGNOSIS — I2510 Atherosclerotic heart disease of native coronary artery without angina pectoris: Secondary | ICD-10-CM | POA: Diagnosis not present

## 2023-05-28 HISTORY — PX: ICD IMPLANT: EP1208

## 2023-05-28 SURGERY — ICD IMPLANT

## 2023-05-28 MED ORDER — LIDOCAINE HCL (PF) 1 % IJ SOLN
INTRAMUSCULAR | Status: AC
  Filled 2023-05-28: qty 30

## 2023-05-28 MED ORDER — ACETAMINOPHEN 325 MG PO TABS
325.0000 mg | ORAL_TABLET | ORAL | Status: DC | PRN
  Administered 2023-05-29: 650 mg via ORAL
  Filled 2023-05-28: qty 2

## 2023-05-28 MED ORDER — MAGNESIUM OXIDE -MG SUPPLEMENT 400 (240 MG) MG PO TABS
200.0000 mg | ORAL_TABLET | Freq: Every day | ORAL | Status: DC
  Administered 2023-05-28 – 2023-05-29 (×2): 200 mg via ORAL
  Filled 2023-05-28 (×2): qty 1

## 2023-05-28 MED ORDER — CEFAZOLIN SODIUM-DEXTROSE 2-4 GM/100ML-% IV SOLN
INTRAVENOUS | Status: AC
  Filled 2023-05-28: qty 100

## 2023-05-28 MED ORDER — CEFAZOLIN SODIUM-DEXTROSE 2-4 GM/100ML-% IV SOLN
2.0000 g | INTRAVENOUS | Status: AC
  Administered 2023-05-28: 2 g via INTRAVENOUS

## 2023-05-28 MED ORDER — LIDOCAINE HCL (PF) 1 % IJ SOLN
INTRAMUSCULAR | Status: AC
  Filled 2023-05-28: qty 60

## 2023-05-28 MED ORDER — SODIUM CHLORIDE 0.9 % IV SOLN
INTRAVENOUS | Status: AC
  Filled 2023-05-28: qty 2

## 2023-05-28 MED ORDER — ONDANSETRON HCL 4 MG/2ML IJ SOLN: 4.0000 mg | Freq: Four times a day (QID) | INTRAMUSCULAR | Status: DC | PRN

## 2023-05-28 MED ORDER — LIDOCAINE HCL (PF) 1 % IJ SOLN
INTRAMUSCULAR | Status: DC | PRN
  Administered 2023-05-28: 90 mL

## 2023-05-28 MED ORDER — MIDAZOLAM HCL 2 MG/2ML IJ SOLN
INTRAMUSCULAR | Status: AC
  Filled 2023-05-28: qty 2

## 2023-05-28 MED ORDER — SODIUM CHLORIDE 0.9 % IV SOLN: INTRAVENOUS | Status: DC

## 2023-05-28 MED ORDER — CEFAZOLIN SODIUM-DEXTROSE 1-4 GM/50ML-% IV SOLN
1.0000 g | Freq: Four times a day (QID) | INTRAVENOUS | Status: AC
  Administered 2023-05-29 (×3): 1 g via INTRAVENOUS
  Filled 2023-05-28 (×3): qty 50

## 2023-05-28 MED ORDER — TAMSULOSIN HCL 0.4 MG PO CAPS
0.4000 mg | ORAL_CAPSULE | Freq: Two times a day (BID) | ORAL | Status: DC
  Administered 2023-05-28 – 2023-05-29 (×2): 0.4 mg via ORAL
  Filled 2023-05-28 (×2): qty 1

## 2023-05-28 MED ORDER — IODIXANOL 320 MG/ML IV SOLN
INTRAVENOUS | Status: DC | PRN
  Administered 2023-05-28 (×2): 15 mL

## 2023-05-28 MED ORDER — MAGNESIUM 300 MG PO CAPS: 300.0000 mg | ORAL_CAPSULE | Freq: Every day | ORAL | Status: DC

## 2023-05-28 MED ORDER — KETOROLAC TROMETHAMINE 30 MG/ML IJ SOLN
30.0000 mg | Freq: Once | INTRAMUSCULAR | Status: AC
  Administered 2023-05-28: 30 mg via INTRAVENOUS
  Filled 2023-05-28: qty 1

## 2023-05-28 MED ORDER — CHLORHEXIDINE GLUCONATE 4 % EX SOLN: 4.0000 | Freq: Once | CUTANEOUS | Status: DC

## 2023-05-28 MED ORDER — SODIUM CHLORIDE 0.9 % IV SOLN
80.0000 mg | INTRAVENOUS | Status: AC
  Administered 2023-05-28: 80 mg

## 2023-05-28 MED ORDER — PANTOPRAZOLE SODIUM 40 MG PO TBEC
40.0000 mg | DELAYED_RELEASE_TABLET | Freq: Every day | ORAL | Status: DC
  Administered 2023-05-29: 40 mg via ORAL
  Filled 2023-05-28: qty 1

## 2023-05-28 MED ORDER — FENTANYL CITRATE (PF) 100 MCG/2ML IJ SOLN
INTRAMUSCULAR | Status: AC
  Filled 2023-05-28: qty 2

## 2023-05-28 MED ORDER — MIDAZOLAM HCL 5 MG/5ML IJ SOLN
INTRAMUSCULAR | Status: DC | PRN
  Administered 2023-05-28: 1 mg via INTRAVENOUS
  Administered 2023-05-28: .5 mg via INTRAVENOUS
  Administered 2023-05-28 (×2): 1 mg via INTRAVENOUS

## 2023-05-28 MED ORDER — MELATONIN 5 MG PO TABS
5.0000 mg | ORAL_TABLET | Freq: Every evening | ORAL | Status: DC | PRN
  Administered 2023-05-29: 5 mg via ORAL
  Filled 2023-05-28: qty 1

## 2023-05-28 MED ORDER — ATORVASTATIN CALCIUM 80 MG PO TABS
80.0000 mg | ORAL_TABLET | Freq: Every evening | ORAL | Status: DC
  Administered 2023-05-28: 80 mg via ORAL
  Filled 2023-05-28: qty 1

## 2023-05-28 MED ORDER — SPIRONOLACTONE 25 MG PO TABS
25.0000 mg | ORAL_TABLET | Freq: Every day | ORAL | Status: DC
  Administered 2023-05-29: 25 mg via ORAL
  Filled 2023-05-28: qty 1

## 2023-05-28 MED ORDER — POVIDONE-IODINE 10 % EX SWAB
2.0000 | Freq: Once | CUTANEOUS | Status: AC
  Administered 2023-05-28: 2 via TOPICAL

## 2023-05-28 MED ORDER — HEPARIN (PORCINE) IN NACL 1000-0.9 UT/500ML-% IV SOLN
INTRAVENOUS | Status: DC | PRN
  Administered 2023-05-28: 500 mL

## 2023-05-28 MED ORDER — FENTANYL CITRATE (PF) 100 MCG/2ML IJ SOLN
INTRAMUSCULAR | Status: DC | PRN
  Administered 2023-05-28 (×3): 25 ug via INTRAVENOUS
  Administered 2023-05-28: 12.5 ug via INTRAVENOUS

## 2023-05-28 MED ORDER — SACUBITRIL-VALSARTAN 24-26 MG PO TABS
1.0000 | ORAL_TABLET | Freq: Two times a day (BID) | ORAL | Status: DC
  Administered 2023-05-29: 1 via ORAL
  Filled 2023-05-28: qty 1

## 2023-05-28 SURGICAL SUPPLY — 10 items
CABLE SURGICAL S-101-97-12 (CABLE) ×1 IMPLANT
ICD MOMENTUM G124 (ICD Generator) IMPLANT
LEAD INGEVITY 7841 52 (Lead) IMPLANT
LEAD RELIANCE 0672 IMPLANT
PAD DEFIB RADIO PHYSIO CONN (PAD) ×1 IMPLANT
POUCH AIGIS-R ANTIBACT ICD (Mesh General) ×1 IMPLANT
POUCH AIGIS-R ANTIBACT ICD LRG (Mesh General) IMPLANT
SHEATH 7FR PRELUDE SNAP 13 (SHEATH) IMPLANT
SHEATH 8FR PRELUDE SNAP 13 (SHEATH) IMPLANT
TRAY PACEMAKER INSERTION (PACKS) ×1 IMPLANT

## 2023-05-28 NOTE — Progress Notes (Signed)
When patient was attempting to move to sit up in bed, patient had what appeared to be a short run of possible vtach on transport monitor. Patient remained alert and oriented to baseline and only complained of arm pain that he had been complaining of. Patient put onto bedside monitor and appeared to be having frequent PVC's/intermittent runs. Dr. Lalla Brothers was called and informed of same. 6E Nurse present at bedside during entire event. Dr. Lalla Brothers advised to get EKG, and that the device rep would come interrogate the device.  Patient left in care of 6E RN.

## 2023-05-28 NOTE — Discharge Instructions (Signed)
After Your ICD (Implantable Cardiac Defibrillator)  ACTIVITY Do not lift your arm above shoulder height for 1 week after your procedure. After 7 days, you may progress as below.  You should remove your sling 24 hours after your procedure, unless otherwise instructed by your provider.     Friday June 04, 2023  Saturday June 05, 2023 Sunday June 06, 2023 Monday June 07, 2023   Do not lift, push, pull, or carry anything over 10 pounds with the affected arm until 6 weeks (Friday July 09, 2023 ) after your procedure.   You may drive AFTER your wound check, unless you have been told otherwise by your provider.   Ask your healthcare provider when you can go back to work   INCISION/Dressing If you are on a blood thinner such as Coumadin, Xarelto, Eliquis, Plavix, or Pradaxa please confirm with your provider when this should be resumed.   If large square, outer bandage is left in place, this can be removed after 24 hours from your procedure. Do not remove steri-strips or glue as below.   Monitor your defibrillator site for redness, swelling, and drainage. Call the device clinic at [phone] if you experience these symptoms or fever/chills.  If your incision is sealed with Steri-strips or staples, you may shower 7 days after your procedure or when told by your provider. Do not remove the steri-strips or let the shower hit directly on your site. You may wash around your site with soap and water.    If you were discharged in a sling, please do not wear this during the day more than 48 hours after your surgery unless otherwise instructed. This may increase the risk of stiffness and soreness in your shoulder.   Avoid lotions, ointments, or perfumes over your incision until it is well-healed.  You may use a hot tub or a pool AFTER your wound check appointment if the incision is completely closed.  Your ICD is designed to protect you from life threatening heart rhythms. Because of  this, you may receive a shock.   1 shock with no symptoms:  Call the office during business hours. 1 shock with symptoms (chest pain, chest pressure, dizziness, lightheadedness, shortness of breath, overall feeling unwell):  Call 911. If you experience 2 or more shocks in 24 hours:  Call 911. If you receive a shock, you should not drive for 6 months per the [place] DMV IF you receive appropriate therapy from your ICD.   ICD Alerts:  Some alerts are vibratory and others beep. These are NOT emergencies. Please call our office to let us know. If this occurs at night or on weekends, it can wait until the next business day. Send a remote transmission.  If your device is capable of reading fluid status (for heart failure), you will be offered monthly monitoring to review this with you.   DEVICE MANAGEMENT Remote monitoring is used to monitor your ICD from home. This monitoring is scheduled every 91 days by our office. It allows [place] to keep an eye on the functioning of your device to ensure it is working properly. You will routinely see your Electrophysiologist annually (more often if necessary).   You should receive your ID card for your new device in 4-8 weeks. Keep this card with you at all times once received. Consider wearing a medical alert bracelet or necklace.  Your ICD  may be MRI compatible. This will be discussed at your next office visit/wound check.  You should avoid  contact with strong electric or magnetic fields.   Do not use amateur (ham) radio equipment or electric (arc) welding torches. MP3 player headphones with magnets should not be used. Some devices are safe to use if held at least 12 inches (30 cm) from your defibrillator. These include power tools, lawn mowers, and speakers. If you are unsure if something is safe to use, ask your health care provider.  When using your cell phone, hold it to the ear that is on the opposite side from the defibrillator. Do not leave your cell phone in a  pocket over the defibrillator.  You may safely use electric blankets, heating pads, computers, and microwave ovens.  Call the office right away if: You have chest pain. You feel more than one shock. You feel more short of breath than you have felt before. You feel more light-headed than you have felt before. Your incision starts to open up.  This information is not intended to replace advice given to you by your health care provider. Make sure you discuss any questions you have with your health care provider.

## 2023-05-28 NOTE — Plan of Care (Signed)
  Problem: Education: Goal: Knowledge of General Education information will improve Description: Including pain rating scale, medication(s)/side effects and non-pharmacologic comfort measures Outcome: Progressing   Problem: Health Behavior/Discharge Planning: Goal: Ability to manage health-related needs will improve Outcome: Progressing   Problem: Activity: Goal: Risk for activity intolerance will decrease Outcome: Progressing   Problem: Pain Managment: Goal: General experience of comfort will improve Outcome: Progressing   Problem: Safety: Goal: Ability to remain free from injury will improve Outcome: Progressing   Problem: Skin Integrity: Goal: Risk for impaired skin integrity will decrease Outcome: Progressing   Problem: Clinical Measurements: Goal: Cardiovascular complication will be avoided Outcome: Progressing

## 2023-05-28 NOTE — H&P (Signed)
Electrophysiology Office Follow up Visit Note:     Date:  05/28/2023    ID:  Joe [name], DOB Dec 01, 1955, MRN [id number]   PCP:  Andi Devon, MD  [facility] Cardiologist:  Chilton Si, MD  [facility] Electrophysiologist:  Lanier Prude, MD      Interval History:     Joe [name] is a 67 y.o. male who presents for a follow up visit.    I saw him last in late 2023. He has a complex medical history including CAD s/p CABG, AVR, Ao root repair, LV epicardial lead placement. He had a very complicated hospital course after his surgery. We planned for ICD and RA lead addition to the epicardial LV lead.    He last saw Dr [name] 01/12/2023. At that appointment he was improving but still was dealing with a sacaral wound. He was planning to start hypeprbaric treatment for this.    In the past, CRT upgrade was deferred due to poorly healing wound.    He takes eliquis for stroke ppx for his AF/AFL.   Today, he is not feeling too good.  He has chest pain with deep breathing and palpation in his chest wall.  He has also noted occasional "popping" in his neck and shoulders. He denies significant shortness of breath or palpitations.   They report that his wound has now closed with his hyperbaric therapy. There are about 8 more potential treatment days; the sessions last for 90 minutes.   Additionally he complains of some abdominal muscle cramping.   At times he complains of headaches. These may be related to his known environmental allergies.   His weight has been stable at home. Sometimes he doesn't sleep well.   He denies any peripheral edema, lightheadedness, syncope, orthopnea, or PND.    Presents for ICD implant. Procedure reviewed.    Objective Past medical, surgical, social and family history were reviewed.   ROS:   Please see the history of present illness.    (+) Chest tightness (+) Headaches (+) Environmental allergies (+) Abdominal muscle  cramps All other systems reviewed and are negative.   EKGs/Labs/Other Studies Reviewed:     The following studies were reviewed today:   03/02/2023 Echo EF 20-25% RV moderately reduced Mild MR     December 01, 2022 EKG shows a wide left bundle branch block, junctional rhythm       Physical Exam:     VS:  BP 116/74   Pulse 64   Ht 5\' 7"  (1.702 m)   Wt 163 lb (73.9 kg)   SpO2 97%   BMI 25.53 kg/m         Wt Readings from Last 3 Encounters:  03/10/23 163 lb (73.9 kg)  01/12/23 166 lb 9.6 oz (75.6 kg)  12/21/22 166 lb (75.3 kg)      GEN:  Well nourished, well developed in no acute distress CARDIAC: RRR, no murmurs, rubs, gallops. Sternotomy incision well healed. MUSC: Tenderness of chest wall on palpation. RESPIRATORY:  Clear to auscultation without rales, wheezing or rhonchi          Assessment ASSESSMENT:     1. Chronic combined systolic and diastolic heart failure (HCC)   2. LBBB (left bundle branch block)   3. Wound of sacral region, subsequent encounter   4. Paroxysmal A-fib (HCC)     PLAN:     In order of problems listed above:     #Chronic systolic HF #LBBB NYHA III.  Warm and euvolemic. Follows with HF clinic.    Discussed role of CRT in treating his HF and protecting against SCD. We discussed procedure and risks.  His wounds have now healed with the assistance of hyperbaric oxygen therapy.  Given that they have healed, can now proceed with implant of his CRT D.  We will hook up a generator to his previously placed epicardial LV lead and I will add a transvenous RV and RA lead.  I discussed the procedure in detail including the risks and he wishes to proceed.   -----------   The patient has a mostly nonischemic CM (EF 25%), NYHA Class III CHF, and CAD.  He is referred by Dr [name] for risk stratification of sudden death and consideration of ICD implantation.  At this time, he meets criteria for ICD implantation for primary prevention of sudden  death.  I have had a thorough discussion with the patient reviewing options.  The patient and their family (if available) have had opportunities to ask questions and have them answered. The patient and I have decided together through a shared decision making process to proceed with ICD implant at this time.     Risks, benefits, alternatives to ICD implantation were discussed in detail with the patient today. The patient understands that the risks include but are not limited to bleeding, infection, pneumothorax, perforation, tamponade, vascular damage, renal failure, MI, stroke, death, inappropriate shocks, and lead dislodgement and wishes to proceed.  We will therefore schedule device implantation at the next available time.   I would like to hold his Eliquis for 48 hours prior to the procedure to minimize the risk of pocket hematoma/infection.  I will reach out to his primary cardiologist, Dr. [name] to confirm that this is a reasonable plan given the presence of a bioprosthetic aortic valve.   #AF On eliquis for stroke ppx.  Plan for 48-hour hold prior to device implant to minimize pocket hematoma.  Presents for ICD implant. Procedure reviewed.      Signed, Steffanie Dunn, MD, [facility], [facility] 05/28/2023 Electrophysiology [facility]

## 2023-05-29 ENCOUNTER — Ambulatory Visit (HOSPITAL_COMMUNITY): Payer: Medicare Other

## 2023-05-29 DIAGNOSIS — Z95810 Presence of automatic (implantable) cardiac defibrillator: Secondary | ICD-10-CM | POA: Diagnosis not present

## 2023-05-29 DIAGNOSIS — I110 Hypertensive heart disease with heart failure: Secondary | ICD-10-CM | POA: Diagnosis not present

## 2023-05-29 DIAGNOSIS — I480 Paroxysmal atrial fibrillation: Secondary | ICD-10-CM | POA: Diagnosis not present

## 2023-05-29 DIAGNOSIS — I5042 Chronic combined systolic (congestive) and diastolic (congestive) heart failure: Secondary | ICD-10-CM | POA: Diagnosis not present

## 2023-05-29 DIAGNOSIS — I255 Ischemic cardiomyopathy: Secondary | ICD-10-CM | POA: Diagnosis not present

## 2023-05-29 MED ORDER — APIXABAN 5 MG PO TABS: 5.0000 mg | ORAL_TABLET | Freq: Two times a day (BID) | ORAL | Status: DC

## 2023-05-29 MED ORDER — ORAL CARE MOUTH RINSE: 15.0000 mL | OROMUCOSAL | Status: DC | PRN

## 2023-05-29 MED ORDER — ASPIRIN 81 MG PO TBEC: 81.0000 mg | DELAYED_RELEASE_TABLET | Freq: Every day | ORAL | Status: DC

## 2023-05-29 NOTE — Progress Notes (Signed)
Patient Name: Joe [name]      SUBJECTIVE: 67 year old man with coronary disease prior CABG aortic valve replacement LV epicardial lead implantation admitted for ICD and right atrial lead addition and pirating of the epicardial LV leads.  History of atrial fibrillation on anticoagulation which was held in anticipation of the surgery.  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   Murmur 05/26/2022   On mechanically assisted ventilation (HCC)    Osteoarthritis of left hip 01/16/2014   Osteoarthritis of right hip 05/15/2014   Primary localized osteoarthrosis of left shoulder 09/21/2017   PVC (premature ventricular contraction) 05/26/2022   Right ureteral stone    Speech impediment    Thoracic ascending aortic aneurysm (HCC)    Umbilical hernia     Scheduled Meds:  Scheduled Meds:  atorvastatin  80 mg Oral QPM   magnesium oxide  200 mg Oral Daily   pantoprazole  40 mg Oral Daily   sacubitril-valsartan  1 tablet Oral BID   spironolactone  25 mg Oral Daily   tamsulosin  0.4 mg Oral BID   Continuous Infusions:   ceFAZolin (ANCEF) IV 1 g (05/29/23 0628)   acetaminophen, melatonin, ondansetron (ZOFRAN) IV    PHYSICAL EXAM Vitals:   05/29/23 0000 05/29/23 0356 05/29/23 0600 05/29/23 0808  BP: 94/66 93/63 112/65 123/64  Pulse: (!) 59 (!) 59 (!) 58 67  Resp: 20 17 17 19   Temp: 98.3 F (36.8 C) 98.4 F (36.9 C)  98.1 F (36.7 C)  TempSrc: Oral Oral  Oral  SpO2: 95% 94%  94%  Weight:      Height:        Well developed and nourished in no  acute distress HENT normal Neck supple with JVP-  flat  Clear Pocket without hematoma, swelling or tenderness  Regular rate and rhythm, no murmurs or gallops Abd-soft with active BS No Clubbing cyanosis edema Skin-warm and dry A & Oriented  Grossly normal sensory and motor function  ECG negative QRS lead I and positive QRS lead V1 PVC I    Chest x-ray without pneumothorax  Intake/Output Summary (Last 24 hours) at 05/29/2023 0957 Last data filed at 05/29/2023 0440 Gross per 24 hour  Intake 480 ml  Output 450 ml  Net 30 ml    LABS: Basic Metabolic Panel: No results for input(s): "NA", "K", "CL", "CO2", "GLUCOSE", "BUN", "CREATININE", "CALCIUM", "MG", "PHOS" in the last 168 hours. Cardiac Enzymes: No results for input(s): "CKTOTAL", "CKMB", "CKMBINDEX", "TROPONINI" in the last 72 hours. CBC: No results for input(s): "WBC", "NEUTROABS", "HGB", "HCT", "MCV", "PLT" in the last 168 hours. PROTIME: No results for input(s): "LABPROT", "INR" in the last 72 hours. Liver Function Tests: No results for input(s): "AST", "ALT", "ALKPHOS", "BILITOT", "PROT", "ALBUMIN" in the last 72 hours. No results for input(s): "LIPASE", "AMYLASE" in the last 72 hours. BNP: BNP (last 3 results) Recent Labs  09/25/22 1420 09/28/22 1105 01/12/23 1628  BNP 241.8* 283.3* 274.4*        Device Interrogation:device function normal    ASSESSMENT AND PLAN:  Ischemic cardiomyopathy  Left bundle branch block  HFrEF-chronic  PVCs   Device function is normal  Plan discharge.  Instructions reviewed.  Will resume Eliquis Wednesday p.m. no prior stroke  PVCs relatively frequent; it would be helpful over time to quantify the PVCs from the device and consider the potential contribution that they may be making to his cardiomyopathy.  Previous event recorder only had about 3%   Signed, Sherryl Manges MD  05/29/2023

## 2023-05-29 NOTE — Discharge Summary (Signed)
Discharge Summary    Patient ID: Joe [name] MRN: [id number]; DOB: 03-28-1956  Admit date: 05/28/2023 Discharge date: 05/29/2023  PCP:  Andi Devon, MD   [facility] Providers Cardiologist:  Chilton Si, MD  Electrophysiologist:  Lanier Prude, MD    Discharge Diagnoses    Principal Problem:   ICD (implantable cardioverter-defibrillator) in place Active Problems:   LBBB (left bundle branch block)   Chronic combined systolic and diastolic heart failure [facility])    Diagnostic Studies/Procedures    ICD Implant 8/16  CONCLUSIONS:   1. Chronic systolic heart failure, LBBB  2. Successful CRT-D implant with 2 implanted transvenous leads (RA and RV) and epicardial LV lead. CRT-D implanted for primary prevention of sudden cardiac death  3.  No early apparent complications.   4. Hold Eliquis for 5 days due to extensive pocket manipulation/dissection (OK to restart 06/03/2023).   5. Maintain pressure dressing for 48 hours. _____________   History of Present Illness     Joe [name] is a 66 y.o. male with a complex medical history including CAD s/p CABG, history of AVR and aortic root repair, severe LV dysfunction, LBBB, LV epicardial lead placement, anxiety, mild cognitive impairment.  He is followed by Dr. Lalla Brothers, and has been pending CRT D implantation.  In the past, CRT upgrade was deferred due to poorly healing wounds.  Patient has a history of atrial fibrillation/flutter and takes Eliquis for stroke prophylaxis.  Patient last saw Dr. [name] in 01/2023.  At that time, patient was improving but still had a sacral wound.  He was planning to start hyperbaric treatment for this.   Patient was seen by Dr. Lalla Brothers on 04/19/2023.  At that time, it was noted that his sacral wound had closed with hyperbaric therapy.  Given that his wound had healed, Dr. Lalla Brothers determined that they could proceed with implantation of his CRT device.  At that appointment, Dr.  Lalla Brothers and patient discussed the role of CRT in treating his heart failure and protecting against sudden cardiac death.  Risks and benefits of the procedure were discussed with the patient and patient wished to proceed.  He was set up for ICD implantation on 05/28/2023 with Dr. Lalla Brothers.  Hospital Course     Consultants:   Patient presented on 8/16 for planned ICD implantation with Dr. Lalla Brothers.  Underwent successful CRT D implant with 2 implanted transvenous leads (RA and RV) and epicardial LV lead.  There were no early apparent complications.  Recommended that Eliquis be held for 5 days due to extensive pocket manipulation  Patient was seen by Dr. Graciela Husbands on 8/17 and was deemed stable for discharge.  Device function was normal on his review.  Dr. Graciela Husbands reviewed postprocedure instructions with patient, and written instructions were provided in patient's discharge paperwork.  Dr. Graciela Husbands recommended resuming Eliquis on Wednesday 8/21 PM.  Patient has a follow-up appointment for wound check on 8/29.  Patient also has an appointment with the Heart Failure clinic on 8/22.     Did the patient have an acute coronary syndrome (MI, NSTEMI, STEMI, etc) this admission?:  No                               Did the patient have a percutaneous coronary intervention (stent / angioplasty)?:  No.       _____________  Discharge Vitals Blood pressure 123/64, pulse 67, temperature 98.1 F (36.7 C), temperature  source Oral, resp. rate 19, height 5\' 6"  (1.676 m), weight 73.9 kg, SpO2 94%.  Filed Weights   05/28/23 1425  Weight: 73.9 kg    Labs & Radiologic Studies    CBC No results for input(s): "WBC", "NEUTROABS", "HGB", "HCT", "MCV", "PLT" in the last 72 hours. Basic Metabolic Panel No results for input(s): "NA", "K", "CL", "CO2", "GLUCOSE", "BUN", "CREATININE", "CALCIUM", "MG", "PHOS" in the last 72 hours. Liver Function Tests No results for input(s): "AST", "ALT", "ALKPHOS", "BILITOT", "PROT", "ALBUMIN"  in the last 72 hours. No results for input(s): "LIPASE", "AMYLASE" in the last 72 hours. High Sensitivity Troponin:   No results for input(s): "TROPONINIHS" in the last 720 hours.  BNP Invalid input(s): "POCBNP" D-Dimer No results for input(s): "DDIMER" in the last 72 hours. Hemoglobin A1C No results for input(s): "HGBA1C" in the last 72 hours. Fasting Lipid Panel No results for input(s): "CHOL", "HDL", "LDLCALC", "TRIG", "CHOLHDL", "LDLDIRECT" in the last 72 hours. Thyroid Function Tests No results for input(s): "TSH", "T4TOTAL", "T3FREE", "THYROIDAB" in the last 72 hours.  Invalid input(s): "FREET3" _____________  DG Chest 2 View  Result Date: 05/29/2023 CLINICAL DATA:  ICD placement. EXAM: CHEST - 2 VIEW COMPARISON:  01/12/2023 FINDINGS: Interval placement of dual lead left-sided ICD in adequate position. Old leads project over the left heart unchanged. Lungs are adequately inflated and otherwise clear. No pneumothorax. Mild stable cardiomegaly. Remainder of the exam is unchanged. IMPRESSION: 1. No acute cardiopulmonary disease. 2. Interval placement of dual lead left-sided ICD in adequate position. Electronically Signed   By: Elberta Fortis M.D.   On: 05/29/2023 09:59   EP PPM/ICD IMPLANT  Result Date: 05/28/2023  CONCLUSIONS:  1. Chronic systolic heart failure, LBBB  2. Successful CRT-D implant with 2 implanted transvenous leads (RA and RV) and epicardial LV lead. CRT-D implanted for primary prevention of sudden cardiac death  3.  No early apparent complications.  4. Hold Eliquis for 5 days due to extensive pocket manipulation/dissection (OK to restart 06/03/2023).  5. Maintain pressure dressing for 48 hours.   Disposition   Pt is being discharged home today in good condition.  Follow-up Plans & Appointments     Discharge Instructions     (HEART FAILURE PATIENTS) Call MD:  Anytime you have any of the following symptoms: 1) 3 pound weight gain in 24 hours or 5 pounds in 1 week 2)  shortness of breath, with or without a dry hacking cough 3) swelling in the hands, feet or stomach 4) if you have to sleep on extra pillows at night in order to breathe.   Complete by: As directed    Call MD for:  extreme fatigue   Complete by: As directed    Call MD for:  persistant dizziness or light-headedness   Complete by: As directed    Call MD for:  redness, tenderness, or signs of infection (pain, swelling, redness, odor or green/yellow discharge around incision site)   Complete by: As directed    Call MD for:  severe uncontrolled pain   Complete by: As directed    Diet - low sodium heart healthy   Complete by: As directed    Discharge instructions   Complete by: As directed    Resume eliquis Wednesday, 8/21 PM. Resume aspirin 8/22   Increase activity slowly   Complete by: As directed         Discharge Medications   Allergies as of 05/29/2023       Reactions   Codeine  Nausea Only   Levaquin [levofloxacin In D5w] Diarrhea        Medication List     TAKE these medications    acetaminophen 650 MG CR tablet Commonly known as: TYLENOL Take 650 mg by mouth every 8 (eight) hours as needed for pain.   apixaban 5 MG Tabs tablet Commonly known as: ELIQUIS Take 1 tablet (5 mg total) by mouth 2 (two) times daily. Start taking on: June 02, 2023 What changed: These instructions start on June 02, 2023. If you are unsure what to do until then, ask your doctor or other care provider.   ascorbic acid 500 MG tablet Commonly known as: VITAMIN C Take 500 mg by mouth in the morning.   aspirin EC 81 MG tablet Take 1 tablet (81 mg total) by mouth daily. Swallow whole. Start taking on: June 03, 2023 What changed: These instructions start on June 03, 2023. If you are unsure what to do until then, ask your doctor or other care provider.   atorvastatin 80 MG tablet Commonly known as: LIPITOR Take 1 tablet (80 mg total) by mouth daily. What changed: when to take this    Co Q-10 100 MG Caps Take 100 mg by mouth in the morning and at bedtime.   Entresto 24-26 MG Generic drug: sacubitril-valsartan Take 1 tablet by mouth 2 (two) times daily.   Magnesium 300 MG Caps Take 300 mg by mouth daily.   melatonin 5 MG Tabs Take 5 mg by mouth at bedtime as needed (sleep).   pantoprazole 40 MG tablet Commonly known as: PROTONIX Take 1 tablet every day by oral route as needed.   spironolactone 25 MG tablet Commonly known as: ALDACTONE Take 1 tablet (25 mg total) by mouth daily.   SUPER TWIN EPA/DHA PO Take 720 mg by mouth in the morning and at bedtime.   tamsulosin 0.4 MG Caps capsule Commonly known as: FLOMAX Take 1 capsule (0.4 mg total) by mouth 2 (two) times daily.   Vitamin B Complex Tabs Take 1 tablet by mouth daily.   Vitamin D3 250 MCG (10000 UT) capsule Take 10,000 Units by mouth in the morning.   ZINC PO Take 60 mg by mouth in the morning.           Outstanding Labs/Studies    Duration of Discharge Encounter   Greater than 30 minutes including physician time.  Signed, Jonita Albee, PA-C 05/29/2023, 12:07 PM

## 2023-05-31 ENCOUNTER — Encounter (HOSPITAL_COMMUNITY): Payer: Self-pay | Admitting: Cardiology

## 2023-05-31 MED FILL — Midazolam HCl Inj 2 MG/2ML (Base Equivalent): INTRAMUSCULAR | Qty: 3.5 | Status: AC

## 2023-06-02 NOTE — Progress Notes (Incomplete)
Advanced Heart Failure Clinic Note   PCP: Andi Devon, MD Primary Cardiologist: Chilton Si, MD  HF Cardiologist: Dr. [name]   Reason for Visit: CHF f/u  HPI: 67 y.o. male with past medication history of mild cognitive impairment and anxiety. He was found to have severe LV dysfunction in setting of markedly dilated aortic root (67 mm) and severe AI (tricuspid AoV). Echo 9/23 EF 25-30%, LV severely dilated, severe AI, Global HK, RV ok. LBBB also noted. [facility] 9/23 showed single vessel CAD w/ 60-70% pRCA stenosis, patent LM, LAD and LCx. RHC w/ low filling pressures and preserved CO, CI 2.53.    Underwent Bentall procedure + CABG x 1, SVR-RCA, on 07/07/22. Intraoperative TEE showed LVEF 20-25%, RV normal. Normal functioning bioprosthetic AoV w/ no AI and no perivalvular leak post repair. He had a prolonged hospital course as post op recovery was complicated by post-cardiotomy shock slow to wean off pressors, severe ileus/constipation, urinary retention/ obstruction requiring placement of foley catheter, atrial fibrillation requiring DCCV, RUE PICC Line Associated DVT, deconditioning and sacral wound.   Echo 05/24: EF 20-25%, RV moderately reduced, normal functioning aortic valve prosthesis  CRT placement had been postponed d/t poorly healing sacral wound. The wound eventually healed with hyperbaric therapy. He saw EP and underwent placement of CRT-D on 05/28/23.  He is here today for f/u. He is accompanied by his mother who assists with providing history. Has been doing well from HF standpoint. No dyspnea, orthopnea, PND or lower extremity edema. Home weight stable between 166-169 lb. Takes diuretic once every couple of weeks. Only complaint is tenderness at site of ICD insertion (required extensive pocket manipulation). No hematoma or drainage.   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     apixaban (ELIQUIS) 5 MG TABS tablet Take 1 tablet (5 mg total) by mouth 2 (two) times daily.     ascorbic acid (VITAMIN C) 500 MG tablet Take 500 mg by mouth in the morning.     aspirin EC 81 MG tablet Take 1 tablet (81 mg total) by mouth daily. Swallow whole.     atorvastatin (LIPITOR) 80 MG tablet Take 1 tablet (80 mg total) by mouth daily. (Patient taking differently: Take 80 mg by mouth every evening.) 90 tablet 2   B Complex Vitamins (VITAMIN B COMPLEX) TABS Take 1 tablet by mouth daily.     carvedilol (COREG) 3.125 MG  tablet Take 1 tablet (3.125 mg total) by mouth 2 (two) times daily. 60 tablet 3   Cholecalciferol (VITAMIN D3) 250 MCG (10000 UT) capsule Take 10,000 Units by mouth in the morning.     Coenzyme Q10 (CO Q-10) 100 MG CAPS Take 100 mg by mouth in the morning and at bedtime.     Magnesium 300 MG CAPS Take 300 mg by mouth daily.     melatonin 5 MG TABS Take 5 mg by mouth at bedtime as needed (sleep).     Multiple Vitamins-Minerals (ZINC PO) Take 60 mg by mouth in the morning.     Omega-3 Fatty Acids (SUPER TWIN EPA/DHA  PO) Take 720 mg by mouth in the morning and at bedtime.     pantoprazole (PROTONIX) 40 MG tablet Take 1 tablet every day by oral route as needed. 30 tablet 3   sacubitril-valsartan (ENTRESTO) 24-26 MG Take 1 tablet by mouth 2 (two) times daily. 60 tablet 3   spironolactone (ALDACTONE) 25 MG tablet Take 1 tablet (25 mg total) by mouth daily. 90 tablet 3   tamsulosin (FLOMAX) 0.4 MG CAPS capsule Take 1 capsule (0.4 mg total) by mouth 2 (two) times daily. 60 capsule 3   No current facility-administered medications for this encounter.   Allergies  Allergen Reactions   Codeine Nausea Only   Levaquin [Levofloxacin In D5w] Diarrhea      Social History   Socioeconomic History   Marital status: Single    Spouse name: Not on file   Number of children: Not on file   Years of education: Not on file   Highest education level: Not on file  Occupational History   Occupation: unemployed  Tobacco Use   Smoking status: Never   Smokeless tobacco: Never  Vaping Use   Vaping status: Never Used  Substance and Sexual Activity   Alcohol use: No   Drug use: No   Sexual activity: Not Currently  Other Topics Concern   Not on file  Social History Narrative   Pt with delayed mental capabilities, 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   Social Determinants of Health   Financial Resource Strain: Low Risk  (10/11/2019)   Overall Financial Resource Strain (CARDIA)    Difficulty of Paying Living Expenses: Not hard at all  Food Insecurity: No Food Insecurity (05/28/2023)   Hunger Vital Sign    Worried About Running Out of Food in the Last Year: Never true    Ran Out of Food in the Last Year: Never true  Transportation Needs: No Transportation Needs (05/28/2023)   PRAPARE - Administrator, Civil Service (Medical): No    Lack of Transportation (Non-Medical): No  Physical  Activity: Sufficiently Active (10/11/2019)   Exercise Vital Sign    Days of Exercise per Week: 5 days    Minutes of Exercise per Session: 40 min  Stress: Stress Concern Present (10/11/2019)   [employer] of Occupational Health - Occupational Stress Questionnaire    Feeling of Stress : To some extent  Social Connections: Not on file  Intimate Partner Violence: Not At Risk (05/28/2023)   Humiliation, Afraid, Rape, and Kick questionnaire    Fear of Current or Ex-Partner: No    Emotionally Abused: No    Physically Abused: No    Sexually Abused: No   Family History  Problem  Relation Age of Onset   Heart failure Mother    Atrial fibrillation Mother    Heart failure Maternal Grandmother    Atrial fibrillation Maternal Grandmother    BP 124/80   Pulse (!) 59   Wt 76.9 kg (169 lb 9.6 oz)   SpO2 95%   BMI 27.37 kg/m   Wt Readings from Last 3 Encounters:  06/03/23 76.9 kg (169 lb 9.6 oz)  05/28/23 73.9 kg (163 lb)  04/20/23 72.6 kg (160 lb)   General:  Well appearing.  HEENT: normal Neck: supple. JVP not elevated. Carotids 2+ bilat; no bruits.  Cor: PMI nondisplaced. Regular rate & rhythm. No rubs, gallops or murmurs. Lungs: clear Abdomen: soft, nontender, nondistended. No hepatosplenomegaly. No bruits or masses. Good bowel sounds. Extremities: no cyanosis, clubbing, rash, edema Neuro: alert & orientedx3, cranial nerves grossly intact. moves all 4 extremities w/o difficulty. Affect pleasant   ReDs: 31%  ASSESSMENT & PLAN: 1. Chronic Systolic Heart Failure  - NICM, severity out of proportion to degree of CAD, likely valvular CM in setting of severe AI +/- LBBB CM  - Echo 9/23 EF 25-30%, LV severely dilated, severe AI, Global HK, RV ok  - s/p bioprosthetic AVR + CABG 9/23; epicardial CRT wires placed during surgery. - Intraoperative TEE LVEF 20-25%, RV ok - Echo (07/21/22): EF 25% RV function improving. Moderate to severe MR (in setting of LBBB) AVR ok   - Echo (10/23):   EF 20% RV moderately reduced. Moderate to severe PI. AVR ok  - Echo (09/03/22): EF < 20%, RV mildly reduced, normal functioning aortic valve prosthesis - Echo (05/24): EF 20-25%, RV moderately reduced, normal functioning aortic valve prosthesis - S/p CRT-D 08/24 - has f/u with device clinic next week - NYHA II. Volume stable on exam and REDS 31%. Weight up from last visit but doubt this is due to fluid. Lasix PRN - Continue spiro 25 mg daily - Continue Entresto 24/26 mg bid. - Add coreg 3.125 mg BID now that he has ICD - Avoid SGLT2i given h/o urinary retention, sacral wound hx UTI.  - Echo in 3 months to assess improvement in LV function with cardiac resynchronization therapy - Labs today  2. Ascending Aortic Aneurysm/ Severe AI  - s/p Bentall with bioprosthetic aortic valve 9/23. - Intraoperative TEE, no perivalvular leak post repair  - Echo 09/03/22: EF < 20%, RV mildly reduced, normal functioning aortic valve prosthesis. - Echo 05/24: EF 20-25%, RV moderately reduced, normal functioning aortic valve prosthesis  3. CAD - s/p CABG x 1 (SVG-RCA) in 09/23 - No CP. - Continue ASA + statin    4. LBBB -Now s/p CRT-D  5. Atrial fib/flutter, post-op - Required DCCV during hospital stay 09/23>>NSR. - Now off amiodarone - Continue Eliquis 5 mg bid. No bleeding issues.   6. Mitral Regurgitation  - Mod-severe on echo 10/23, in setting of LBBB. - Mild on echo 05/24     Follow up: APP in 3 months with echo  Reeve Mallo N, PA-C  11:38 AM

## 2023-06-03 ENCOUNTER — Other Ambulatory Visit (HOSPITAL_BASED_OUTPATIENT_CLINIC_OR_DEPARTMENT_OTHER): Payer: Self-pay

## 2023-06-03 ENCOUNTER — Ambulatory Visit (HOSPITAL_COMMUNITY)
Admission: RE | Admit: 2023-06-03 | Discharge: 2023-06-03 | Disposition: A | Discharge status: Home / Self Care | Payer: Medicare Other | Source: Ambulatory Visit | Attending: Physician Assistant | Admitting: Physician Assistant

## 2023-06-03 ENCOUNTER — Encounter (HOSPITAL_COMMUNITY): Payer: Self-pay

## 2023-06-03 VITALS — BP 124/80 | HR 59 | Wt 169.6 lb

## 2023-06-03 DIAGNOSIS — G3184 Mild cognitive impairment, so stated: Secondary | ICD-10-CM | POA: Insufficient documentation

## 2023-06-03 DIAGNOSIS — Z8249 Family history of ischemic heart disease and other diseases of the circulatory system: Secondary | ICD-10-CM | POA: Insufficient documentation

## 2023-06-03 DIAGNOSIS — Z953 Presence of xenogenic heart valve: Secondary | ICD-10-CM | POA: Insufficient documentation

## 2023-06-03 DIAGNOSIS — I4891 Unspecified atrial fibrillation: Secondary | ICD-10-CM | POA: Insufficient documentation

## 2023-06-03 DIAGNOSIS — Z95810 Presence of automatic (implantable) cardiac defibrillator: Secondary | ICD-10-CM | POA: Insufficient documentation

## 2023-06-03 DIAGNOSIS — I110 Hypertensive heart disease with heart failure: Secondary | ICD-10-CM | POA: Diagnosis not present

## 2023-06-03 DIAGNOSIS — Z7901 Long term (current) use of anticoagulants: Secondary | ICD-10-CM | POA: Insufficient documentation

## 2023-06-03 DIAGNOSIS — Z79899 Other long term (current) drug therapy: Secondary | ICD-10-CM | POA: Diagnosis not present

## 2023-06-03 DIAGNOSIS — Z951 Presence of aortocoronary bypass graft: Secondary | ICD-10-CM | POA: Diagnosis not present

## 2023-06-03 DIAGNOSIS — I4892 Unspecified atrial flutter: Secondary | ICD-10-CM | POA: Insufficient documentation

## 2023-06-03 DIAGNOSIS — I428 Other cardiomyopathies: Secondary | ICD-10-CM | POA: Diagnosis not present

## 2023-06-03 DIAGNOSIS — I080 Rheumatic disorders of both mitral and aortic valves: Secondary | ICD-10-CM | POA: Insufficient documentation

## 2023-06-03 DIAGNOSIS — I5022 Chronic systolic (congestive) heart failure: Secondary | ICD-10-CM

## 2023-06-03 DIAGNOSIS — I2510 Atherosclerotic heart disease of native coronary artery without angina pectoris: Secondary | ICD-10-CM | POA: Diagnosis not present

## 2023-06-03 DIAGNOSIS — I447 Left bundle-branch block, unspecified: Secondary | ICD-10-CM | POA: Insufficient documentation

## 2023-06-03 DIAGNOSIS — I7121 Aneurysm of the ascending aorta, without rupture: Secondary | ICD-10-CM | POA: Diagnosis not present

## 2023-06-03 DIAGNOSIS — Z952 Presence of prosthetic heart valve: Secondary | ICD-10-CM

## 2023-06-03 LAB — BASIC METABOLIC PANEL
Anion gap: 9 (ref 5–15)
BUN: 12 mg/dL (ref 8–23)
CO2: 24 mmol/L (ref 22–32)
Calcium: 9.3 mg/dL (ref 8.9–10.3)
Chloride: 103 mmol/L (ref 98–111)
Creatinine, Ser: 0.74 mg/dL (ref 0.61–1.24)
GFR, Estimated: >60 mL/min (ref >60)
Glucose, Bld: 99 mg/dL (ref 70–99)
Potassium: 4.2 mmol/L (ref 3.5–5.1)
Sodium: 136 mmol/L (ref 135–145)

## 2023-06-03 LAB — BRAIN NATRIURETIC PEPTIDE: B Natriuretic Peptide: 110.5 pg/mL — ABNORMAL HIGH (ref 0.0–100.0)

## 2023-06-03 MED ORDER — CARVEDILOL 3.125 MG PO TABS
3.1250 mg | ORAL_TABLET | Freq: Two times a day (BID) | ORAL | 3 refills | Status: DC
  Filled 2023-06-03: qty 60, 30d supply, fill #0
  Filled 2023-06-29: qty 60, 30d supply, fill #1
  Filled 2023-08-05: qty 60, 30d supply, fill #2
  Filled 2023-08-14 – 2023-09-06 (×2): qty 60, 30d supply, fill #3

## 2023-06-03 NOTE — Addendum Note (Signed)
Encounter addended by: Andrey Farmer, PA-C on: 06/03/2023 11:45 AM  Actions taken: Clinical Note Signed

## 2023-06-03 NOTE — Patient Instructions (Signed)
Medication Changes:  START: CARVEDILOL 3.125MG  TWICE DAILY   Lab Work:  Labs done today, your results will be available in MyChart, we will contact you for abnormal readings.  Follow-Up in: 3 MONTHS AS SCHEDULED WITH APP WITH ECHO RIGHT BEFORE AS SCHEDULED   At the [facility], you and your health needs are our priority. We have a designated team specialized in the treatment of Heart Failure. This Care Team includes your primary Heart Failure Specialized Cardiologist (physician), Advanced Practice Providers (APPs- Physician Assistants and Nurse Practitioners), and Pharmacist who all work together to provide you with the care you need, when you need it.   You may see any of the following providers on your designated Care Team at your next follow up:  Dr. Arvilla Meres Dr. Marca Ancona Dr. Marcos Eke, NP Robbie Lis, [place] [facility] [place], [place] Brynda Peon, NP Karle Plumber, PharmD   Please be sure to bring in all your medications bottles to every appointment.   Need to Contact us:  If you have any questions or concerns before your next appointment please send [place] a message through [place] or call our office at [phone].    TO LEAVE A MESSAGE FOR THE NURSE SELECT OPTION 2, PLEASE LEAVE A MESSAGE INCLUDING: YOUR NAME DATE OF BIRTH CALL BACK NUMBER REASON FOR CALL**this is important as we prioritize the call backs  YOU WILL RECEIVE A CALL BACK THE SAME DAY AS LONG AS YOU CALL BEFORE 4:00 PM

## 2023-06-03 NOTE — Progress Notes (Signed)
ReDS Vest / Clip - 06/03/23 1100       ReDS Vest / Clip   Station Marker A    Ruler Value 34    ReDS Value Range Low volume    ReDS Actual Value 31

## 2023-06-04 ENCOUNTER — Other Ambulatory Visit (HOSPITAL_BASED_OUTPATIENT_CLINIC_OR_DEPARTMENT_OTHER): Payer: Self-pay

## 2023-06-07 ENCOUNTER — Other Ambulatory Visit (HOSPITAL_BASED_OUTPATIENT_CLINIC_OR_DEPARTMENT_OTHER): Payer: Self-pay

## 2023-06-07 ENCOUNTER — Other Ambulatory Visit (HOSPITAL_COMMUNITY): Payer: Self-pay | Admitting: Family Medicine

## 2023-06-07 ENCOUNTER — Other Ambulatory Visit: Payer: Self-pay

## 2023-06-09 ENCOUNTER — Other Ambulatory Visit (HOSPITAL_BASED_OUTPATIENT_CLINIC_OR_DEPARTMENT_OTHER): Payer: Self-pay

## 2023-06-09 MED ORDER — APIXABAN 5 MG PO TABS
5.0000 mg | ORAL_TABLET | Freq: Two times a day (BID) | ORAL | 1 refills | Status: DC
  Filled 2023-06-09 – 2023-06-21 (×2): qty 60, 30d supply, fill #0
  Filled 2023-07-24 (×2): qty 60, 30d supply, fill #1

## 2023-06-10 ENCOUNTER — Ambulatory Visit: Payer: Medicare Other | Attending: Cardiovascular Disease

## 2023-06-10 DIAGNOSIS — I447 Left bundle-branch block, unspecified: Secondary | ICD-10-CM

## 2023-06-10 DIAGNOSIS — I5022 Chronic systolic (congestive) heart failure: Secondary | ICD-10-CM

## 2023-06-10 NOTE — Patient Instructions (Signed)

## 2023-06-10 NOTE — Progress Notes (Signed)

## 2023-06-16 ENCOUNTER — Other Ambulatory Visit (HOSPITAL_BASED_OUTPATIENT_CLINIC_OR_DEPARTMENT_OTHER): Payer: Self-pay

## 2023-06-16 MED ORDER — AMOXICILLIN 500 MG PO CAPS
2000.0000 mg | ORAL_CAPSULE | ORAL | 0 refills | Status: AC
  Filled 2023-06-16: qty 12, 3d supply, fill #0

## 2023-06-18 ENCOUNTER — Other Ambulatory Visit (HOSPITAL_BASED_OUTPATIENT_CLINIC_OR_DEPARTMENT_OTHER): Payer: Self-pay

## 2023-06-21 ENCOUNTER — Other Ambulatory Visit (HOSPITAL_BASED_OUTPATIENT_CLINIC_OR_DEPARTMENT_OTHER): Payer: Self-pay

## 2023-07-11 ENCOUNTER — Other Ambulatory Visit (HOSPITAL_BASED_OUTPATIENT_CLINIC_OR_DEPARTMENT_OTHER): Payer: Self-pay

## 2023-07-24 ENCOUNTER — Other Ambulatory Visit (HOSPITAL_BASED_OUTPATIENT_CLINIC_OR_DEPARTMENT_OTHER): Payer: Self-pay

## 2023-07-24 ENCOUNTER — Other Ambulatory Visit (HOSPITAL_BASED_OUTPATIENT_CLINIC_OR_DEPARTMENT_OTHER): Payer: Self-pay | Admitting: Cardiovascular Disease

## 2023-07-26 ENCOUNTER — Other Ambulatory Visit (HOSPITAL_BASED_OUTPATIENT_CLINIC_OR_DEPARTMENT_OTHER): Payer: Self-pay

## 2023-07-26 MED ORDER — ATORVASTATIN CALCIUM 80 MG PO TABS
80.0000 mg | ORAL_TABLET | Freq: Every day | ORAL | 3 refills | Status: DC
  Filled 2023-07-26 – 2023-09-06 (×3): qty 90, 90d supply, fill #0
  Filled 2023-11-26: qty 90, 90d supply, fill #1
  Filled 2024-03-01: qty 90, 90d supply, fill #2
  Filled 2024-06-23 – 2024-06-24 (×2): qty 90, 90d supply, fill #3

## 2023-07-28 ENCOUNTER — Other Ambulatory Visit (HOSPITAL_BASED_OUTPATIENT_CLINIC_OR_DEPARTMENT_OTHER): Payer: Self-pay

## 2023-08-14 ENCOUNTER — Other Ambulatory Visit (HOSPITAL_COMMUNITY): Payer: Self-pay | Admitting: Family Medicine

## 2023-08-14 ENCOUNTER — Other Ambulatory Visit (HOSPITAL_BASED_OUTPATIENT_CLINIC_OR_DEPARTMENT_OTHER): Payer: Self-pay

## 2023-08-16 ENCOUNTER — Other Ambulatory Visit (HOSPITAL_BASED_OUTPATIENT_CLINIC_OR_DEPARTMENT_OTHER): Payer: Self-pay

## 2023-08-16 ENCOUNTER — Other Ambulatory Visit: Payer: Self-pay

## 2023-08-16 MED ORDER — ENTRESTO 24-26 MG PO TABS
1.0000 | ORAL_TABLET | Freq: Two times a day (BID) | ORAL | 3 refills | Status: DC
  Filled 2023-08-16: qty 60, 30d supply, fill #0
  Filled 2023-09-13: qty 60, 30d supply, fill #1
  Filled 2023-10-14: qty 60, 30d supply, fill #2
  Filled 2023-11-06 (×2): qty 60, 30d supply, fill #3

## 2023-08-17 ENCOUNTER — Other Ambulatory Visit (HOSPITAL_BASED_OUTPATIENT_CLINIC_OR_DEPARTMENT_OTHER): Payer: Self-pay

## 2023-08-23 ENCOUNTER — Encounter: Payer: Medicare Other | Admitting: Cardiology

## 2023-08-23 ENCOUNTER — Other Ambulatory Visit (HOSPITAL_COMMUNITY): Payer: Self-pay | Admitting: Family Medicine

## 2023-08-25 ENCOUNTER — Other Ambulatory Visit (HOSPITAL_BASED_OUTPATIENT_CLINIC_OR_DEPARTMENT_OTHER): Payer: Self-pay

## 2023-08-25 MED ORDER — APIXABAN 5 MG PO TABS
5.0000 mg | ORAL_TABLET | Freq: Two times a day (BID) | ORAL | 1 refills | Status: DC
  Filled 2023-08-25: qty 60, 30d supply, fill #0
  Filled 2023-09-29: qty 60, 30d supply, fill #1

## 2023-08-30 ENCOUNTER — Ambulatory Visit (INDEPENDENT_AMBULATORY_CARE_PROVIDER_SITE_OTHER): Payer: Medicare Other

## 2023-08-30 DIAGNOSIS — I428 Other cardiomyopathies: Secondary | ICD-10-CM

## 2023-08-30 DIAGNOSIS — I5022 Chronic systolic (congestive) heart failure: Secondary | ICD-10-CM

## 2023-08-30 LAB — CUP PACEART REMOTE DEVICE CHECK
Battery Remaining Longevity: 90 mo
Battery Remaining Percentage: 100 %
Brady Statistic RA Percent Paced: 37 %
Brady Statistic RV Percent Paced: 95 %
Date Time Interrogation Session: 20241118003200
HighPow Impedance: 51 Ohm
Implantable Lead Connection Status: 753985
Implantable Lead Connection Status: 753985
Implantable Lead Connection Status: 753985
Implantable Lead Implant Date: 20230927
Implantable Lead Implant Date: 20240816
Implantable Lead Implant Date: 20240816
Implantable Lead Location: 753858
Implantable Lead Location: 753859
Implantable Lead Location: 753860
Implantable Lead Model: 511212
Implantable Lead Model: 672
Implantable Lead Model: 7841
Implantable Lead Serial Number: 1479461
Implantable Lead Serial Number: 238954
Implantable Lead Serial Number: 324875
Implantable Pulse Generator Implant Date: 20240816
Lead Channel Impedance Value: 407 Ohm
Lead Channel Impedance Value: 441 Ohm
Lead Channel Impedance Value: 612 Ohm
Lead Channel Pacing Threshold Amplitude: 0.4 V
Lead Channel Pacing Threshold Amplitude: 0.5 V
Lead Channel Pacing Threshold Amplitude: 1.7 V
Lead Channel Pacing Threshold Pulse Width: 0.4 ms
Lead Channel Pacing Threshold Pulse Width: 0.4 ms
Lead Channel Pacing Threshold Pulse Width: 0.4 ms
Lead Channel Setting Pacing Amplitude: 3 V
Lead Channel Setting Pacing Amplitude: 3.5 V
Lead Channel Setting Pacing Amplitude: 3.5 V
Lead Channel Setting Pacing Pulse Width: 0.4 ms
Lead Channel Setting Pacing Pulse Width: 0.4 ms
Lead Channel Setting Sensing Sensitivity: 0.5 mV
Lead Channel Setting Sensing Sensitivity: 1 mV
Pulse Gen Serial Number: 519842
Zone Setting Status: 755011

## 2023-09-01 NOTE — Progress Notes (Signed)
Advanced Heart Failure Clinic Note   PCP: Andi Devon, MD Primary Cardiologist: Chilton Si, MD  HF Cardiologist: Dr. [name]   Reason for Visit: f/u for chronic systolic heart failure   HPI: 67 y.o. male with past medication history of mild cognitive impairment and anxiety. He was found to have severe LV dysfunction in setting of markedly dilated aortic root (67 mm) and severe AI (tricuspid AoV). Echo 9/23 EF 25-30%, LV severely dilated, severe AI, Global HK, RV ok. LBBB also noted. [facility] 9/23 showed single vessel CAD w/ 60-70% pRCA stenosis, patent LM, LAD and LCx. RHC w/ low filling pressures and preserved CO, CI 2.53.    Underwent Bentall procedure + CABG x 1, SVR-RCA, on 07/07/22. Intraoperative TEE showed LVEF 20-25%, RV normal. Normal functioning bioprosthetic AoV w/ no AI and no perivalvular leak post repair. He had a prolonged hospital course as post op recovery was complicated by post-cardiotomy shock slow to wean off pressors, severe ileus/constipation, urinary retention/ obstruction requiring placement of foley catheter, atrial fibrillation requiring DCCV, RUE PICC Line Associated DVT, deconditioning and sacral wound.   Echo 05/24: EF 20-25%, RV moderately reduced, normal functioning aortic valve prosthesis  CRT placement had been postponed d/t poorly healing sacral wound. The wound eventually healed with hyperbaric therapy. He saw EP and underwent placement of CRT-D on 05/28/23.  He is here today for f/u. He is accompanied by his mother. He has been doing well. Getting around ok. Wt stable and at baseline at 168 lb. BP well controlled, 120/82. Device interrogation shows normal HL score, 1. 95% V paced. No VT. AT/AF burden 0%. Activity level 2.3 hr/day.  No exertional symptoms w/ basic ADLs and physical. Able to ride his bike w/o exertional dyspnea. Denies CP. No LEE, orthopnea/PND.   2D echo completed this morning. Interpretation pending.     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     amoxicillin (AMOXIL) 500 MG capsule Take 4 capsules (2,000 mg total) by mouth 1 hour prior to dental procedure. 12 capsule 0   apixaban (ELIQUIS) 5 MG TABS tablet Take 1 tablet (5 mg total) by mouth 2 (two) times daily.     apixaban (ELIQUIS) 5 MG TABS tablet Take 1 tablet (5 mg total) by mouth 2 (two) times daily. 60 tablet 1   ascorbic acid (VITAMIN C) 500 MG tablet Take 500 mg by mouth in the morning.  aspirin EC 81 MG tablet Take 1 tablet (81 mg total) by mouth daily. Swallow whole.     atorvastatin (LIPITOR) 80 MG tablet Take 1 tablet (80 mg total) by mouth daily. 90 tablet 3   B Complex Vitamins (VITAMIN B COMPLEX) TABS Take 1 tablet by mouth daily.     carvedilol (COREG) 3.125 MG tablet Take 1 tablet (3.125 mg total) by mouth 2 (two) times daily. 60 tablet 3   Cholecalciferol (VITAMIN D3) 250 MCG (10000 UT) capsule Take 10,000 Units by mouth in the morning.     Coenzyme Q10 (CO Q-10) 100 MG CAPS Take 100 mg by mouth in the  morning and at bedtime.     Magnesium 300 MG CAPS Take 300 mg by mouth daily.     melatonin 5 MG TABS Take 5 mg by mouth at bedtime as needed (sleep).     Multiple Vitamins-Minerals (ZINC PO) Take 60 mg by mouth in the morning.     Omega-3 Fatty Acids (SUPER TWIN EPA/DHA PO) Take 720 mg by mouth in the morning and at bedtime.     pantoprazole (PROTONIX) 40 MG tablet Take 1 tablet every day by oral route as needed. 30 tablet 3   sacubitril-valsartan (ENTRESTO) 24-26 MG Take 1 tablet by mouth 2 (two) times daily. 60 tablet 3   spironolactone (ALDACTONE) 25 MG tablet Take 1 tablet (25 mg total) by mouth daily. 90 tablet 3   tamsulosin (FLOMAX) 0.4 MG CAPS capsule Take 1 capsule (0.4 mg total) by mouth 2 (two) times daily. 60 capsule 3   No current facility-administered medications for this visit.   Allergies  Allergen Reactions   Codeine Nausea Only   Levaquin [Levofloxacin In D5w] Diarrhea      Social History   Socioeconomic History   Marital status: Single    Spouse name: Not on file   Number of children: Not on file   Years of education: Not on file   Highest education level: Not on file  Occupational History   Occupation: unemployed  Tobacco Use   Smoking status: Never   Smokeless tobacco: Never  Vaping Use   Vaping status: Never Used  Substance and Sexual Activity   Alcohol use: No   Drug use: No   Sexual activity: Not Currently  Other Topics Concern   Not on file  Social History Narrative   Pt with delayed mental capabilities, 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   Social Determinants of Health   Financial Resource Strain: Low Risk  (10/11/2019)   Overall Financial Resource Strain (CARDIA)    Difficulty of Paying Living Expenses: Not hard at all  Food Insecurity: No Food Insecurity (05/28/2023)   Hunger Vital Sign    Worried About Running Out of  Food in the Last Year: Never true    Ran Out of Food in the Last Year: Never true  Transportation Needs: No Transportation Needs (05/28/2023)   PRAPARE - Administrator, Civil Service (Medical): No    Lack of Transportation (Non-Medical): No  Physical Activity: Sufficiently Active (10/11/2019)   Exercise Vital Sign    Days of Exercise per Week: 5 days    Minutes of Exercise per Session: 40 min  Stress: Stress Concern Present (10/11/2019)   [employer] of Occupational Health - Occupational Stress Questionnaire    Feeling of  Stress : To some extent  Social Connections: Not on file  Intimate Partner Violence: Not At Risk (05/28/2023)   Humiliation, Afraid, Rape, and Kick questionnaire    Fear of Current or Ex-Partner: No    Emotionally Abused: No    Physically Abused: No    Sexually Abused: No   Family History  Problem Relation Age of Onset   Heart failure Mother    Atrial fibrillation Mother    Heart failure Maternal Grandmother    Atrial fibrillation Maternal Grandmother    There were no vitals taken for this visit.  Wt Readings from Last 3 Encounters:  06/03/23 76.9 kg (169 lb 9.6 oz)  05/28/23 73.9 kg (163 lb)  04/20/23 72.6 kg (160 lb)   PHYSICAL EXAM: General:  Well appearing. No respiratory difficulty HEENT: normal Neck: supple. no JVD. Carotids 2+ bilat; no bruits. No lymphadenopathy or thyromegaly appreciated. Cor: PMI nondisplaced. Regular rate & rhythm. No rubs, gallops or murmurs. Lungs: clear Abdomen: soft, nontender, nondistended. No hepatosplenomegaly. No bruits or masses. Good bowel sounds. Extremities: no cyanosis, clubbing, rash, edema Neuro: alert & oriented x 3, cranial nerves grossly intact. moves all 4 extremities w/o difficulty. Affect pleasant.  ASSESSMENT & PLAN: 1. Chronic Systolic Heart Failure  - NICM, severity out of proportion to degree of CAD, likely valvular CM in setting of severe AI +/- LBBB CM  - Echo 9/23 EF 25-30%, LV  severely dilated, severe AI, Global HK, RV ok  - s/p bioprosthetic AVR + CABG 9/23; epicardial CRT wires placed during surgery. - Intraoperative TEE LVEF 20-25%, RV ok - Echo (07/21/22): EF 25% RV function improving. Moderate to severe MR (in setting of LBBB) AVR ok   - Echo (10/23):  EF 20% RV moderately reduced. Moderate to severe PI. AVR ok  - Echo (09/03/22): EF < 20%, RV mildly reduced, normal functioning aortic valve prosthesis - Echo (05/24): EF 20-25%, RV moderately reduced, normal functioning aortic valve prosthesis - S/p CRT-D 08/24>>95% V paced on device interrogation. No VT  - NYHA I-II. Volume stable on exam and device interrogation. HL score 1  - Continue Lasix PRN - Continue spiro 25 mg daily - Continue Entresto 24/26 mg bid. No dose titration due to h/o orthostasis - Continue Coreg 3.125 mg BID - Avoid SGLT2i given h/o urinary retention/UTI and sacral wound hx  - Echo today to assess improvement in LV function with cardiac resynchronization therapy. Results pending - Check BMP/BNP today   2. Ascending Aortic Aneurysm/ Severe AI  - s/p Bentall with bioprosthetic aortic valve 9/23. - Intraoperative TEE, no perivalvular leak post repair  - Echo 09/03/22: EF < 20%, RV mildly reduced, normal functioning aortic valve prosthesis. - Echo 05/24: EF 20-25%, RV moderately reduced, normal functioning aortic valve prosthesis - Echo today    3. CAD - s/p CABG x 1 (SVG-RCA) in 09/23 - Stable w/o CP  - Continue ASA + statin + ? blocker   4. LBBB - Now s/p CRT-D - Device interrogation today shows 95% V pacing - Echo completed today to see if any improvement in EF. Results pending   5. Atrial fib/flutter, post-op - Required DCCV during hospital stay 09/23>>NSR. - Now off amiodarone - EKG today A-sensed, V-paced  - 0% AT/AF burden on device interrogation   - Continue Eliquis 5 mg bid. No bleeding issues. Check CBC   6. Mitral Regurgitation  - Mod-severe on echo 10/23, in  setting of LBBB. - Mild on echo 05/24 - Echo today  pending     Follow up: in 3-6 months w/ Dr. Aquilla Solian, PA-C  8:54 AM

## 2023-09-02 ENCOUNTER — Ambulatory Visit (HOSPITAL_COMMUNITY)
Admission: RE | Admit: 2023-09-02 | Discharge: 2023-09-02 | Disposition: A | Discharge status: Home / Self Care | Payer: Medicare Other | Source: Ambulatory Visit | Attending: Cardiology | Admitting: Cardiology

## 2023-09-02 ENCOUNTER — Encounter (HOSPITAL_COMMUNITY): Payer: Self-pay

## 2023-09-02 ENCOUNTER — Ambulatory Visit (HOSPITAL_BASED_OUTPATIENT_CLINIC_OR_DEPARTMENT_OTHER)
Admission: RE | Admit: 2023-09-02 | Discharge: 2023-09-02 | Disposition: A | Discharge status: Home / Self Care | Payer: Medicare Other | Source: Ambulatory Visit | Attending: Cardiology | Admitting: Cardiology

## 2023-09-02 VITALS — BP 120/82 | HR 75 | Wt 168.8 lb

## 2023-09-02 DIAGNOSIS — I110 Hypertensive heart disease with heart failure: Secondary | ICD-10-CM | POA: Insufficient documentation

## 2023-09-02 DIAGNOSIS — I2510 Atherosclerotic heart disease of native coronary artery without angina pectoris: Secondary | ICD-10-CM

## 2023-09-02 DIAGNOSIS — Z79899 Other long term (current) drug therapy: Secondary | ICD-10-CM | POA: Insufficient documentation

## 2023-09-02 DIAGNOSIS — Z951 Presence of aortocoronary bypass graft: Secondary | ICD-10-CM | POA: Diagnosis not present

## 2023-09-02 DIAGNOSIS — Z87440 Personal history of urinary (tract) infections: Secondary | ICD-10-CM | POA: Diagnosis not present

## 2023-09-02 DIAGNOSIS — I340 Nonrheumatic mitral (valve) insufficiency: Secondary | ICD-10-CM | POA: Insufficient documentation

## 2023-09-02 DIAGNOSIS — I5022 Chronic systolic (congestive) heart failure: Secondary | ICD-10-CM | POA: Insufficient documentation

## 2023-09-02 DIAGNOSIS — Z953 Presence of xenogenic heart valve: Secondary | ICD-10-CM | POA: Diagnosis not present

## 2023-09-02 DIAGNOSIS — I4892 Unspecified atrial flutter: Secondary | ICD-10-CM | POA: Diagnosis not present

## 2023-09-02 DIAGNOSIS — G3184 Mild cognitive impairment, so stated: Secondary | ICD-10-CM | POA: Diagnosis not present

## 2023-09-02 DIAGNOSIS — I428 Other cardiomyopathies: Secondary | ICD-10-CM | POA: Insufficient documentation

## 2023-09-02 DIAGNOSIS — I7121 Aneurysm of the ascending aorta, without rupture: Secondary | ICD-10-CM | POA: Insufficient documentation

## 2023-09-02 DIAGNOSIS — Z7901 Long term (current) use of anticoagulants: Secondary | ICD-10-CM | POA: Diagnosis not present

## 2023-09-02 DIAGNOSIS — F79 Unspecified intellectual disabilities: Secondary | ICD-10-CM | POA: Insufficient documentation

## 2023-09-02 DIAGNOSIS — I4891 Unspecified atrial fibrillation: Secondary | ICD-10-CM | POA: Diagnosis not present

## 2023-09-02 DIAGNOSIS — I447 Left bundle-branch block, unspecified: Secondary | ICD-10-CM

## 2023-09-02 DIAGNOSIS — F419 Anxiety disorder, unspecified: Secondary | ICD-10-CM | POA: Diagnosis not present

## 2023-09-02 LAB — BASIC METABOLIC PANEL
Anion gap: 7 (ref 5–15)
BUN: 15 mg/dL (ref 8–23)
CO2: 22 mmol/L (ref 22–32)
Calcium: 9.4 mg/dL (ref 8.9–10.3)
Chloride: 107 mmol/L (ref 98–111)
Creatinine, Ser: 0.64 mg/dL (ref 0.61–1.24)
GFR, Estimated: >60 mL/min (ref >60)
Glucose, Bld: 95 mg/dL (ref 70–99)
Potassium: 4.5 mmol/L (ref 3.5–5.1)
Sodium: 136 mmol/L (ref 135–145)

## 2023-09-02 LAB — CBC
HCT: 42.1 % (ref 39.0–52.0)
Hemoglobin: 14.7 g/dL (ref 13.0–17.0)
MCH: 35.9 pg — ABNORMAL HIGH (ref 26.0–34.0)
MCHC: 34.9 g/dL (ref 30.0–36.0)
MCV: 102.7 fL — ABNORMAL HIGH (ref 80.0–100.0)
Platelets: 170 10*3/uL (ref 150–400)
RBC: 4.1 MIL/uL — ABNORMAL LOW (ref 4.22–5.81)
RDW: 14.1 % (ref 11.5–15.5)
WBC: 5.5 10*3/uL (ref 4.0–10.5)
nRBC: 0 % (ref 0.0–0.2)

## 2023-09-02 LAB — ECHOCARDIOGRAM COMPLETE
AR max vel: 2 cm2
AV Area VTI: 1.65 cm2
AV Area mean vel: 1.84 cm2
AV Mean grad: 3 mm[Hg]
AV Peak grad: 5.7 mm[Hg]
Ao pk vel: 1.19 m/s
Area-P 1/2: 5.75 cm2
Calc EF: 51.8 %
S' Lateral: 3.9 cm
Single Plane A2C EF: 45.5 %
Single Plane A4C EF: 53.9 %

## 2023-09-02 LAB — BRAIN NATRIURETIC PEPTIDE: B Natriuretic Peptide: 73.3 pg/mL (ref 0.0–100.0)

## 2023-09-02 NOTE — Patient Instructions (Signed)
No change in medications. Return to see Dr. [name] in 6 months. Please call us at [phone] in February to schedule this appointment. Please call us at [phone] if any questions or concerns prior to your next visit.

## 2023-09-02 NOTE — Progress Notes (Signed)
*  PRELIMINARY RESULTS* Echocardiogram 2D Echocardiogram has been performed.  Joe [name] 09/02/2023, 8:52 AM

## 2023-09-06 ENCOUNTER — Other Ambulatory Visit (HOSPITAL_BASED_OUTPATIENT_CLINIC_OR_DEPARTMENT_OTHER): Payer: Self-pay

## 2023-09-24 NOTE — Progress Notes (Signed)
Remote ICD transmission.   

## 2023-09-26 NOTE — Progress Notes (Unsigned)
Cardiology Office Note:  .   Date:  09/26/2023  ID:  Joe [name], DOB 1955/10/23, MRN [id number] PCP: Andi Devon, MD  [facility] Providers Cardiologist:  Chilton Si, MD Electrophysiologist:  Lanier Prude, MD {  History of Present Illness: [name] Kitchen   Joe [name] is a 67 y.o. male w/PMHx of CAD/VHD/dilated AO root/ascending AO (s/p Aortic Root replacement with reimplantation of coronary buttons, CABG X 1 with RSVG from aortic graft to mid RCA, AVR on 07/10/22, tunneled epicardial LV lead ), LBBB, chronic CHF (systolic) felt to be mostly non-ischemic, AFib, HTN, HLD  Sept 2023, he had prolonged hospitalization post op with CHF/shock, AF > DCCV'd, ileus, urinary retention, and picc associated RUE DVT, CRT-D implant deferred to allow recovery   Nov 2023 EP visit, struggling with sacral wound and device implant again deferred  Wound healing slow eventually planned for hyperbaric tx  At his visit with Dr. Lalla Brothers 03/10/23, reported healed wounds, not feeling great, planned to proceed with implant  Planned for implant 04/20/23 canceled 2/2 computer/internet outage  Implanted 05/28/23 Device clinic as usual 06/10/23, well healed.  Saw the HF team 09/02/23, doing "OK", getting around  at baseline weight, riding bike. Avoiding SGLT2i with his hx of UTIs/sacral wound Echo planned  TTE LVEF up to 40-45%, no WMA, , RV OK, AVR functioning normally  Today's visit is scheduled as his 91 day post implant visit  ROS:   He is accompanied by his Mom, who he lives with  His chart reports known mild cognitive impairment, his Mom suggests some hx of brain damage, though doesn't elaborate. He has headache today, they report known sinus headaches, and b/l shoulder upper back ache that is worse with turning his head, massage helps this. No reports of CP, palpitations No near syncope or syncope. Occ orthostatic dizziness No bleeding or signs of bleeding  Today's BP is c/w his  home BPs  Symptoms are a bit difficult to tease out though sounds like he is at his baseline, activity, and no new symptoms.  Today he is bothered by back pain, his Mom suspect musculoskeletal, massage his shoulders today, does change the aching/helps, and turning his head makes it worse. He mentions he hasn;t been sleeping well intermittently    Device information BSCi CRT-D implanted 05/28/2023 He has EPICARDIAL LV lead implanted 07/08/22  Arrhythmia/AAD hx Amiodarone started Sept 2023 for AFib >> stopped April 2024  Studies Reviewed: [name] Kitchen    EKG not done today  DEVICE interrogation done today and reviewed by myself Battery and lead measurements are good AP 37% VP/BP 95-96% No VT/arrhythmias 2 PMTs Outputs reduced  RA/RV > 2.5 V LV to 2.5V as well this leaves a 1 V margin on this lead   09/02/23: TTE 1. Left ventricular ejection fraction, by estimation, is 40 to 45%. The  left ventricle has mildly decreased function. The left ventricle has no  regional wall motion abnormalities. There is moderate left ventricular  hypertrophy. Left ventricular  diastolic parameters are indeterminate.   2. Right ventricular systolic function is normal. The right ventricular  size is normal.   3. The mitral valve is normal in structure. No evidence of mitral valve  regurgitation. No evidence of mitral stenosis.   4. The aortic valve has been repaired/replaced. Aortic valve  regurgitation is not visualized. No aortic stenosis is present. There is a  29 mm KNOECT Resilia valve present in the aortic position. Echo findings  are consistent with normal structure  and  function of the aortic valve prosthesis. Aortic valve mean gradient  measures 3.0 mmHg. Aortic valve Vmax measures 1.19 m/s.   5. Aortic dilatation noted. There is mild dilatation of the ascending  aorta, measuring 42 mm.   6. The inferior vena cava is normal in size with greater than 50%  respiratory variability, suggesting right  atrial pressure of 3 mmHg.    08/19/22; TTE  1. Findings similar to previous 08/03/22.   2. Left ventricular ejection fraction, by estimation, is <20%. The left  ventricle has severely decreased function. The left ventricle demonstrates  global hypokinesis. The left ventricular internal cavity size was severely  dilated. There is mild  concentric left ventricular hypertrophy. Left ventricular diastolic  parameters are consistent with Grade I diastolic dysfunction (impaired  relaxation).   3. Right ventricular systolic function is mildly reduced. The right  ventricular size is mildly enlarged. There is normal pulmonary artery  systolic pressure.   4. Left atrial size was severely dilated.   5. Right atrial size was mildly dilated.   6. The mitral valve is normal in structure. Mild mitral valve  regurgitation. No evidence of mitral stenosis.   7. The aortic valve has been repaired/replaced. Aortic valve  regurgitation is not visualized. No aortic stenosis is present. There is a  29 mm Resilia Konect Conduit Valve valve present in the aortic position.  Procedure Date: 07/08/22. Echo findings are  consistent with normal structure and function of the aortic valve  prosthesis.   8. Aortic root/ascending aorta has been repaired/replaced and dilatation  noted. There is mild dilatation of the aortic root, measuring 45 mm.        Risk Assessment/Calculations:    Physical Exam:   VS:  There were no vitals taken for this visit.   Wt Readings from Last 3 Encounters:  09/02/23 168 lb 12.8 oz (76.6 kg)  06/03/23 169 lb 9.6 oz (76.9 kg)  05/28/23 163 lb (73.9 kg)    GEN: Well nourished, well developed in no acute distress NECK: No JVD; No carotid bruits CARDIAC: RRR, no murmurs, rubs, gallops RESPIRATORY:  CTA b/l without rales, wheezing or rhonchi  ABDOMEN: Soft, non-tender, non-distended EXTREMITIES:  No edema; No deformity   ICD site: is stable, no thinning, fluctuation,  tethering  ASSESSMENT AND PLAN: .    CRT-D  Well healed Device functioning normally Outputs adjusted to chronic outputs  Paroxysmal AFib CHA2DS2Vasc is 4, on Eliquis, appropriately dosed zero % burden  CAD No anginal sounding symptoms C/w Dr. Rollen Sox  CM (mostly NICM) Chronic CHF No symptoms or exam findings of volume OL Heart Logix score is zero C/w Dr. Lynne Logan  Secondary hypercoagulable state 2/2 AFib   Dispo: remotes as usual, back in clinic with EP in August/his 1 year Demarious post implant, sooner if needed  Signed, Sheilah Pigeon, PA-C

## 2023-09-28 ENCOUNTER — Ambulatory Visit: Payer: Medicare Other | Attending: Physician Assistant | Admitting: Physician Assistant

## 2023-09-28 ENCOUNTER — Encounter: Payer: Medicare Other | Admitting: Cardiology

## 2023-09-28 ENCOUNTER — Encounter: Payer: Self-pay | Admitting: Physician Assistant

## 2023-09-28 VITALS — BP 94/62 | HR 71 | Ht 66.0 in | Wt 165.4 lb

## 2023-09-28 DIAGNOSIS — I480 Paroxysmal atrial fibrillation: Secondary | ICD-10-CM

## 2023-09-28 DIAGNOSIS — I2510 Atherosclerotic heart disease of native coronary artery without angina pectoris: Secondary | ICD-10-CM

## 2023-09-28 DIAGNOSIS — D6869 Other thrombophilia: Secondary | ICD-10-CM

## 2023-09-28 DIAGNOSIS — I428 Other cardiomyopathies: Secondary | ICD-10-CM

## 2023-09-28 DIAGNOSIS — Z95810 Presence of automatic (implantable) cardiac defibrillator: Secondary | ICD-10-CM

## 2023-09-28 LAB — CUP PACEART INCLINIC DEVICE CHECK
Date Time Interrogation Session: 20241217174241
HighPow Impedance: 45 Ohm
Implantable Lead Connection Status: 753985
Implantable Lead Connection Status: 753985
Implantable Lead Connection Status: 753985
Implantable Lead Implant Date: 20230927
Implantable Lead Implant Date: 20240816
Implantable Lead Implant Date: 20240816
Implantable Lead Location: 753858
Implantable Lead Location: 753859
Implantable Lead Location: 753860
Implantable Lead Model: 511212
Implantable Lead Model: 672
Implantable Lead Model: 7841
Implantable Lead Serial Number: 1479461
Implantable Lead Serial Number: 238954
Implantable Lead Serial Number: 324875
Implantable Pulse Generator Implant Date: 20240816
Lead Channel Impedance Value: 407 Ohm
Lead Channel Impedance Value: 450 Ohm
Lead Channel Impedance Value: 662 Ohm
Lead Channel Pacing Threshold Amplitude: 0.6 V
Lead Channel Pacing Threshold Amplitude: 0.6 V
Lead Channel Pacing Threshold Amplitude: 1.4 V
Lead Channel Pacing Threshold Pulse Width: 0.4 ms
Lead Channel Pacing Threshold Pulse Width: 0.4 ms
Lead Channel Pacing Threshold Pulse Width: 0.4 ms
Lead Channel Sensing Intrinsic Amplitude: 10.3 mV
Lead Channel Sensing Intrinsic Amplitude: 14.5 mV
Lead Channel Sensing Intrinsic Amplitude: 4.5 mV
Lead Channel Setting Pacing Amplitude: 2.5 V
Lead Channel Setting Pacing Amplitude: 2.5 V
Lead Channel Setting Pacing Amplitude: 2.5 V
Lead Channel Setting Pacing Pulse Width: 0.4 ms
Lead Channel Setting Pacing Pulse Width: 0.4 ms
Lead Channel Setting Sensing Sensitivity: 0.5 mV
Lead Channel Setting Sensing Sensitivity: 1 mV
Pulse Gen Serial Number: 519842
Zone Setting Status: 755011

## 2023-09-28 NOTE — Patient Instructions (Addendum)
Medication Instructions:   Your physician recommends that you continue on your current medications as directed. Please refer to the Current Medication list given to you today.   *If you need a refill on your cardiac medications before your next appointment, please call your pharmacy*   Lab Work: NONE ORDERED  TODAY    If you have labs (blood work) drawn today and your tests are completely normal, you will receive your results only by: MyChart Message (if you have MyChart) OR A paper copy in the mail If you have any lab test that is abnormal or we need to change your treatment, we will call you to review the results.   Testing/Procedures: NONE ORDERED  TODAY    Follow-Up:  At [facility], you and your health needs are our priority.  As part of our continuing mission to provide you with exceptional heart care, we have created designated Provider Care Teams.  These Care Teams include your primary Cardiologist (physician) and Advanced Practice Providers (APPs -  Physician Assistants and Nurse Practitioners) who all work together to provide you with the care you need, when you need it.  We recommend signing up for the patient portal called "MyChart".  Sign up information is provided on this After Visit Summary.  MyChart is used to connect with patients for Virtual Visits (Telemedicine).  Patients are able to view lab/test results, encounter notes, upcoming appointments, etc.  Non-urgent messages can be sent to your provider as well.   To learn more about what you can do with MyChart, go to [email or web address].    Your next appointment:    1 year(s)  Provider:    You may see Lanier Prude, MD or one of the following Advanced Practice Providers on your designated Care Team:   Francis Dowse, [place]  Other Instructions

## 2023-09-29 ENCOUNTER — Other Ambulatory Visit (HOSPITAL_BASED_OUTPATIENT_CLINIC_OR_DEPARTMENT_OTHER): Payer: Self-pay

## 2023-10-14 ENCOUNTER — Other Ambulatory Visit (HOSPITAL_BASED_OUTPATIENT_CLINIC_OR_DEPARTMENT_OTHER): Payer: Self-pay

## 2023-10-16 ENCOUNTER — Other Ambulatory Visit (HOSPITAL_COMMUNITY): Payer: Self-pay | Admitting: Physician Assistant

## 2023-10-18 ENCOUNTER — Other Ambulatory Visit (HOSPITAL_BASED_OUTPATIENT_CLINIC_OR_DEPARTMENT_OTHER): Payer: Self-pay

## 2023-10-18 MED ORDER — CARVEDILOL 3.125 MG PO TABS
3.1250 mg | ORAL_TABLET | Freq: Two times a day (BID) | ORAL | 3 refills | Status: DC
  Filled 2023-10-18: qty 60, 30d supply, fill #0
  Filled 2023-12-09: qty 60, 30d supply, fill #1
  Filled 2024-02-09: qty 60, 30d supply, fill #2
  Filled 2024-03-14: qty 60, 30d supply, fill #3

## 2023-10-20 ENCOUNTER — Other Ambulatory Visit (HOSPITAL_BASED_OUTPATIENT_CLINIC_OR_DEPARTMENT_OTHER): Payer: Self-pay

## 2023-10-20 MED ORDER — TAMSULOSIN HCL 0.4 MG PO CAPS
0.4000 mg | ORAL_CAPSULE | Freq: Two times a day (BID) | ORAL | 3 refills | Status: DC
  Filled 2023-10-20: qty 120, 60d supply, fill #0
  Filled 2023-12-18 (×2): qty 120, 60d supply, fill #1
  Filled 2024-02-24: qty 120, 60d supply, fill #2
  Filled 2024-03-13: qty 120, 60d supply, fill #3

## 2023-10-28 ENCOUNTER — Other Ambulatory Visit (HOSPITAL_BASED_OUTPATIENT_CLINIC_OR_DEPARTMENT_OTHER): Payer: Self-pay

## 2023-10-28 MED ORDER — NITROFURANTOIN MONOHYD MACRO 100 MG PO CAPS
100.0000 mg | ORAL_CAPSULE | Freq: Two times a day (BID) | ORAL | 0 refills | Status: DC
  Filled 2023-10-28: qty 14, 7d supply, fill #0

## 2023-10-29 ENCOUNTER — Other Ambulatory Visit (HOSPITAL_BASED_OUTPATIENT_CLINIC_OR_DEPARTMENT_OTHER): Payer: Self-pay

## 2023-10-29 ENCOUNTER — Other Ambulatory Visit (HOSPITAL_COMMUNITY): Payer: Self-pay | Admitting: Family Medicine

## 2023-10-29 MED ORDER — APIXABAN 5 MG PO TABS
5.0000 mg | ORAL_TABLET | Freq: Two times a day (BID) | ORAL | 11 refills | Status: DC
  Filled 2023-10-29: qty 60, 30d supply, fill #0
  Filled 2023-11-26: qty 60, 30d supply, fill #1
  Filled 2023-12-25 (×2): qty 60, 30d supply, fill #2
  Filled 2023-12-27 – 2024-01-26 (×2): qty 60, 30d supply, fill #3
  Filled 2024-03-01: qty 60, 30d supply, fill #4
  Filled 2024-04-21: qty 60, 30d supply, fill #5
  Filled 2024-05-26: qty 60, 30d supply, fill #6
  Filled 2024-07-02: qty 60, 30d supply, fill #7
  Filled 2024-07-28: qty 60, 30d supply, fill #8
  Filled 2024-09-08: qty 60, 30d supply, fill #9
  Filled 2024-10-10: qty 60, 30d supply, fill #10

## 2023-11-06 ENCOUNTER — Other Ambulatory Visit (HOSPITAL_BASED_OUTPATIENT_CLINIC_OR_DEPARTMENT_OTHER): Payer: Self-pay

## 2023-11-29 ENCOUNTER — Other Ambulatory Visit (HOSPITAL_BASED_OUTPATIENT_CLINIC_OR_DEPARTMENT_OTHER): Payer: Self-pay

## 2023-11-29 ENCOUNTER — Ambulatory Visit: Payer: Medicare Other

## 2023-11-29 DIAGNOSIS — I5022 Chronic systolic (congestive) heart failure: Secondary | ICD-10-CM

## 2023-11-29 DIAGNOSIS — I428 Other cardiomyopathies: Secondary | ICD-10-CM

## 2023-11-29 LAB — CUP PACEART REMOTE DEVICE CHECK
Battery Remaining Longevity: 114 mo
Battery Remaining Percentage: 100 %
Brady Statistic RA Percent Paced: 37 %
Brady Statistic RV Percent Paced: 97 %
Date Time Interrogation Session: 20250217003100
HighPow Impedance: 53 Ohm
Implantable Lead Connection Status: 753985
Implantable Lead Connection Status: 753985
Implantable Lead Connection Status: 753985
Implantable Lead Implant Date: 20230927
Implantable Lead Implant Date: 20240816
Implantable Lead Implant Date: 20240816
Implantable Lead Location: 753858
Implantable Lead Location: 753859
Implantable Lead Location: 753860
Implantable Lead Model: 511212
Implantable Lead Model: 672
Implantable Lead Model: 7841
Implantable Lead Serial Number: 1479461
Implantable Lead Serial Number: 238954
Implantable Lead Serial Number: 324875
Implantable Pulse Generator Implant Date: 20240816
Lead Channel Impedance Value: 406 Ohm
Lead Channel Impedance Value: 447 Ohm
Lead Channel Impedance Value: 665 Ohm
Lead Channel Pacing Threshold Amplitude: 0.4 V
Lead Channel Pacing Threshold Amplitude: 0.6 V
Lead Channel Pacing Threshold Amplitude: 1.6 V
Lead Channel Pacing Threshold Pulse Width: 0.4 ms
Lead Channel Pacing Threshold Pulse Width: 0.4 ms
Lead Channel Pacing Threshold Pulse Width: 0.4 ms
Lead Channel Setting Pacing Amplitude: 2.5 V
Lead Channel Setting Pacing Amplitude: 2.5 V
Lead Channel Setting Pacing Amplitude: 2.5 V
Lead Channel Setting Pacing Pulse Width: 0.4 ms
Lead Channel Setting Pacing Pulse Width: 0.4 ms
Lead Channel Setting Sensing Sensitivity: 0.5 mV
Lead Channel Setting Sensing Sensitivity: 1 mV
Pulse Gen Serial Number: 519842
Zone Setting Status: 755011

## 2023-12-01 ENCOUNTER — Encounter: Payer: Self-pay | Admitting: Cardiology

## 2023-12-03 ENCOUNTER — Other Ambulatory Visit (HOSPITAL_BASED_OUTPATIENT_CLINIC_OR_DEPARTMENT_OTHER): Payer: Self-pay

## 2023-12-03 MED ORDER — SULFAMETHOXAZOLE-TRIMETHOPRIM 800-160 MG PO TABS
1.0000 | ORAL_TABLET | Freq: Two times a day (BID) | ORAL | 0 refills | Status: DC
  Filled 2023-12-03: qty 14, 7d supply, fill #0

## 2023-12-04 ENCOUNTER — Other Ambulatory Visit (HOSPITAL_BASED_OUTPATIENT_CLINIC_OR_DEPARTMENT_OTHER): Payer: Self-pay

## 2023-12-09 ENCOUNTER — Other Ambulatory Visit: Payer: Self-pay

## 2023-12-09 ENCOUNTER — Other Ambulatory Visit (HOSPITAL_COMMUNITY): Payer: Self-pay | Admitting: Family Medicine

## 2023-12-11 ENCOUNTER — Other Ambulatory Visit (HOSPITAL_BASED_OUTPATIENT_CLINIC_OR_DEPARTMENT_OTHER): Payer: Self-pay

## 2023-12-13 ENCOUNTER — Other Ambulatory Visit (HOSPITAL_BASED_OUTPATIENT_CLINIC_OR_DEPARTMENT_OTHER): Payer: Self-pay

## 2023-12-13 MED ORDER — SACUBITRIL-VALSARTAN 24-26 MG PO TABS
1.0000 | ORAL_TABLET | Freq: Two times a day (BID) | ORAL | 11 refills | Status: DC
  Filled 2023-12-13: qty 60, 30d supply, fill #0
  Filled 2024-01-07: qty 60, 30d supply, fill #1
  Filled 2024-02-09: qty 60, 30d supply, fill #2
  Filled 2024-03-14: qty 60, 30d supply, fill #3
  Filled 2024-05-18: qty 60, 30d supply, fill #4

## 2023-12-14 ENCOUNTER — Telehealth (HOSPITAL_COMMUNITY): Payer: Self-pay

## 2023-12-14 NOTE — Progress Notes (Incomplete)
 Advanced Heart Failure Clinic Note    PCP: Andi Devon, MD Primary Cardiologist: Chilton Si, MD  HF Cardiologist: Dr. [name]   Chief Complaint: Heart Failure Follow-up  HPI: 68 y.o. male with past medication history of mild cognitive impairment and anxiety. He was found to have severe LV dysfunction in setting of markedly dilated aortic root (67 mm) and severe AI (tricuspid AoV). Echo 9/23 EF 25-30%, LV severely dilated, severe AI, Global HK, RV ok. LBBB also noted. [facility] 9/23 showed single vessel CAD w/ 60-70% pRCA stenosis, patent LM, LAD and LCx. RHC w/ low filling pressures and preserved CO, CI 2.53.    Underwent Bentall procedure + CABG x 1, SVR-RCA, on 07/07/22. Intraoperative TEE showed LVEF 20-25%, RV normal. Normal functioning bioprosthetic AoV w/ no AI and no perivalvular leak post repair. He had a prolonged hospital course as post op recovery was complicated by post-cardiotomy shock slow to wean off pressors, severe ileus/constipation, urinary retention/ obstruction requiring placement of foley catheter, atrial fibrillation requiring DCCV, RUE PICC Line Associated DVT, deconditioning and sacral wound.   Echo 5/24: EF 20-25%, RV moderately reduced, normal functioning aortic valve prosthesis  Underwent placement of CRT-D on 05/28/23.  09/02/23:  He is here today for f/u. He is accompanied by his mother. He has been doing well. Getting around ok. Wt stable and at baseline at 168 lb. BP well controlled, 120/82. Device interrogation shows normal HL score, 1. 95% V paced. No VT. AT/AF burden 0%. Activity level 2.3 hr/day.  No exertional symptoms w/ basic ADLs and physical. Able to ride his bike w/o exertional dyspnea. Denies CP. No Jannie Doyle, orthopnea/PND.    Echo 11/24: EF 40-45%, RV normal, no AI/AS, aorta dilation 42mm.   Today she returns for HF follow up. Overall feeling fine. Denies increasing SOB, palpitations, abnormal bleeding, CP, dizziness, edema, or PND/Orthopnea.  Appetite ok. No fever or chills. Weight at home 170 pounds. Taking all medications.        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     amoxicillin (AMOXIL) 500 MG capsule Take 4 capsules (2,000 mg total) by mouth 1 hour prior to dental procedure. 12 capsule 0   apixaban (ELIQUIS) 5 MG TABS tablet Take 1 tablet (5 mg total) by mouth 2 (two) times daily. 60 tablet 11   ascorbic acid (VITAMIN C) 500 MG tablet Take 500 mg by mouth in the morning.  aspirin EC 81 MG tablet Take 1 tablet (81 mg total) by mouth daily. Swallow whole.     atorvastatin (LIPITOR) 80 MG tablet Take 1 tablet (80 mg total) by mouth daily. 90 tablet 3   B Complex Vitamins (VITAMIN B COMPLEX) TABS Take 1 tablet by mouth daily.     carvedilol (COREG) 3.125 MG tablet Take 1 tablet (3.125 mg total) by mouth 2 (two) times daily. 60 tablet 3   Cholecalciferol (VITAMIN D3) 250 MCG (10000 UT) capsule Take 10,000 Units by mouth in the morning.     Coenzyme Q10 (CO Q-10) 100 MG CAPS Take 100  mg by mouth in the morning and at bedtime.     Magnesium 300 MG CAPS Take 300 mg by mouth daily.     melatonin 5 MG TABS Take 5 mg by mouth at bedtime as needed (sleep).     Multiple Vitamins-Minerals (ZINC PO) Take 60 mg by mouth in the morning.     nitrofurantoin, macrocrystal-monohydrate, (MACROBID) 100 MG capsule Take 1 capsule (100 mg total) by mouth every 12 (twelve) hours. 14 capsule 0   Omega-3 Fatty Acids (SUPER TWIN EPA/DHA PO) Take 720 mg by mouth in the morning and at bedtime.     pantoprazole (PROTONIX) 40 MG tablet Take 1 tablet every day by oral route as needed. 30 tablet 3   sacubitril-valsartan (ENTRESTO) 24-26 MG Take 1 tablet by mouth 2 (two) times daily. 60 tablet 11   spironolactone (ALDACTONE) 25 MG tablet Take 1 tablet (25 mg total) by mouth daily. 90 tablet 3   sulfamethoxazole-trimethoprim (BACTRIM DS) 800-160 MG tablet Take 1 tablet by mouth every 12 (twelve) hours. 14 tablet 0   tamsulosin (FLOMAX) 0.4 MG CAPS capsule Take 1 capsule (0.4 mg total) by mouth 2 (two) times daily. 120 capsule 3   No current facility-administered medications for this visit.   Allergies  Allergen Reactions   Codeine Nausea Only   Levaquin [Levofloxacin In D5w] Diarrhea      Social History   Socioeconomic History   Marital status: Single    Spouse name: Not on file   Number of children: Not on file   Years of education: Not on file   Highest education level: Not on file  Occupational History   Occupation: unemployed  Tobacco Use   Smoking status: Never   Smokeless tobacco: Never  Vaping Use   Vaping status: Never Used  Substance and Sexual Activity   Alcohol use: No   Drug use: No   Sexual activity: Not Currently  Other Topics Concern   Not on file  Social History Narrative   Pt with delayed mental capabilities, 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    Social Drivers of Corporate investment banker Strain: Low Risk  (10/11/2019)   Overall Financial Resource Strain (CARDIA)    Difficulty of Paying Living Expenses: Not hard at all  Food Insecurity: No Food Insecurity (05/28/2023)   Hunger Vital Sign    Worried About Running Out of Food in the Last Year: Never true    Ran Out of Food in the Last Year: Never true  Transportation Needs: No Transportation Needs (05/28/2023)   PRAPARE - Administrator, Civil Service (Medical): No    Lack of Transportation (Non-Medical): No  Physical Activity: Sufficiently Active (10/11/2019)   Exercise Vital  Sign    Days of Exercise per Week: 5 days    Minutes of Exercise per Session: 40 min  Stress: Stress Concern Present (10/11/2019)   [employer] of Occupational Health - Occupational Stress Questionnaire    Feeling of Stress : To some extent  Social Connections: Not on file  Intimate Partner Violence: Not At Risk (05/28/2023)   Humiliation, Afraid, Rape, and Kick questionnaire    Fear of Current or Ex-Partner: No    Emotionally Abused: No    Physically Abused: No    Sexually Abused: No   Family History  Problem Relation Age of Onset   Heart failure Mother    Atrial fibrillation Mother    Heart failure Maternal Grandmother    Atrial fibrillation Maternal Grandmother    There were no vitals taken for this visit.  Wt Readings from Last 3 Encounters:  09/28/23 75 kg (165 lb 6.4 oz)  09/02/23 76.6 kg (168 lb 12.8 oz)  06/03/23 76.9 kg (169 lb 9.6 oz)   PHYSICAL EXAM: General:  Well appearing. No respiratory difficulty HEENT: normal Neck: supple. no JVD. Carotids 2+ bilat; no bruits. No lymphadenopathy or thyromegaly appreciated. Cor: PMI nondisplaced. Regular rate & rhythm. No rubs, gallops or murmurs. Lungs: clear Abdomen: soft, nontender, nondistended. No hepatosplenomegaly. No bruits or masses. Good bowel sounds. Extremities: no cyanosis, clubbing, rash, edema Neuro:  alert & oriented x 3, cranial nerves grossly intact. moves all 4 extremities w/o difficulty. Affect pleasant.  ASSESSMENT & PLAN: 1. Chronic Systolic Heart Failure  - NICM, severity out of proportion to degree of CAD, likely valvular CM in setting of severe AI +/- LBBB CM  - Echo 9/23 EF 25-30%, LV severely dilated, severe AI, Global HK, RV ok  - s/p bioprosthetic AVR + CABG 9/23; epicardial CRT wires placed during surgery. - Echo (5/24): EF 20-25%, RV moderately reduced, normal functioning aortic valve prosthesis - S/p CRT-D 08/24>>95% V paced on device interrogation. No VT  - Echo (11/24) improved EF 40-45, nl RV, no AI/AS, aorta dilation 42mm. - NYHA I-II. Volume stable on exam and device interrogation. HL score 1 *** - Continue Lasix PRN - Continue spiro 25 mg daily - Continue Entresto 24/26 mg bid. No dose titration due to h/o orthostasis - Continue Coreg 3.125 mg BID - Avoid SGLT2i given h/o urinary retention/UTI and sacral wound hx  - Check BMP/BNP today   2. Ascending Aortic Aneurysm/ Severe AI  - s/p Bentall with bioprosthetic aortic valve 9/23. - Intraoperative TEE, no perivalvular leak post repair  - Echo 11/23, 5/24, and 11/24 normal functioning aortic valve prosthesis.  3. CAD - s/p CABG x 1 (SVG-RCA) in 09/23 - Stable w/o CP  - Continue ASA + statin + ? blocker   4. LBBB - Now s/p CRT-D - Device interrogation today shows 95% V pacing ***  5. Paroxysmal Atrial fib/flutter - s/p DCCCV 09/23 - Off amio - 0% AT/AF burden on device interrogation  *** - Continue Eliquis 5 mg bid. No bleeding issues. Check CBC   6. Mitral Regurgitation  - Mod-severe on echo 10/23, in setting of LBBB. - no MR on echo 11/24  Follow up: in 3-6 months w/ Dr. [name]   [place] Margurite Duffy, NP  3:24 PM

## 2023-12-14 NOTE — Telephone Encounter (Signed)
 Called patient to confirm appointment scheduled tomorrow here in the AHF clinic. Patient aware of appointment time, date, and location. Patient verified they will be at appointment.

## 2023-12-15 ENCOUNTER — Encounter (HOSPITAL_COMMUNITY): Payer: Self-pay

## 2023-12-15 ENCOUNTER — Ambulatory Visit (HOSPITAL_COMMUNITY)
Admission: RE | Admit: 2023-12-15 | Discharge: 2023-12-15 | Disposition: A | Discharge status: Home / Self Care | Payer: Medicare Other | Source: Ambulatory Visit | Attending: Adult Health | Admitting: Adult Health

## 2023-12-15 VITALS — BP 104/68 | HR 80 | Ht 66.0 in | Wt 169.8 lb

## 2023-12-15 DIAGNOSIS — I428 Other cardiomyopathies: Secondary | ICD-10-CM | POA: Insufficient documentation

## 2023-12-15 DIAGNOSIS — Z951 Presence of aortocoronary bypass graft: Secondary | ICD-10-CM | POA: Insufficient documentation

## 2023-12-15 DIAGNOSIS — I2510 Atherosclerotic heart disease of native coronary artery without angina pectoris: Secondary | ICD-10-CM | POA: Diagnosis not present

## 2023-12-15 DIAGNOSIS — Z7901 Long term (current) use of anticoagulants: Secondary | ICD-10-CM | POA: Insufficient documentation

## 2023-12-15 DIAGNOSIS — Z953 Presence of xenogenic heart valve: Secondary | ICD-10-CM | POA: Diagnosis not present

## 2023-12-15 DIAGNOSIS — I110 Hypertensive heart disease with heart failure: Secondary | ICD-10-CM | POA: Diagnosis not present

## 2023-12-15 DIAGNOSIS — Z79899 Other long term (current) drug therapy: Secondary | ICD-10-CM | POA: Insufficient documentation

## 2023-12-15 DIAGNOSIS — I7121 Aneurysm of the ascending aorta, without rupture: Secondary | ICD-10-CM | POA: Diagnosis not present

## 2023-12-15 DIAGNOSIS — I340 Nonrheumatic mitral (valve) insufficiency: Secondary | ICD-10-CM | POA: Diagnosis not present

## 2023-12-15 DIAGNOSIS — I5042 Chronic combined systolic (congestive) and diastolic (congestive) heart failure: Secondary | ICD-10-CM

## 2023-12-15 DIAGNOSIS — F419 Anxiety disorder, unspecified: Secondary | ICD-10-CM | POA: Diagnosis not present

## 2023-12-15 DIAGNOSIS — I447 Left bundle-branch block, unspecified: Secondary | ICD-10-CM | POA: Diagnosis not present

## 2023-12-15 DIAGNOSIS — I5022 Chronic systolic (congestive) heart failure: Secondary | ICD-10-CM | POA: Insufficient documentation

## 2023-12-15 DIAGNOSIS — G3184 Mild cognitive impairment, so stated: Secondary | ICD-10-CM | POA: Insufficient documentation

## 2023-12-15 DIAGNOSIS — I351 Nonrheumatic aortic (valve) insufficiency: Secondary | ICD-10-CM | POA: Diagnosis not present

## 2023-12-15 DIAGNOSIS — I480 Paroxysmal atrial fibrillation: Secondary | ICD-10-CM

## 2023-12-15 LAB — BASIC METABOLIC PANEL
Anion gap: 4 — ABNORMAL LOW (ref 5–15)
BUN: 14 mg/dL (ref 8–23)
CO2: 25 mmol/L (ref 22–32)
Calcium: 9.3 mg/dL (ref 8.9–10.3)
Chloride: 107 mmol/L (ref 98–111)
Creatinine, Ser: 0.86 mg/dL (ref 0.61–1.24)
GFR, Estimated: >60 mL/min (ref >60)
Glucose, Bld: 110 mg/dL — ABNORMAL HIGH (ref 70–99)
Potassium: 4.3 mmol/L (ref 3.5–5.1)
Sodium: 136 mmol/L (ref 135–145)

## 2023-12-15 LAB — BRAIN NATRIURETIC PEPTIDE: B Natriuretic Peptide: 43.1 pg/mL (ref 0.0–100.0)

## 2023-12-15 NOTE — Patient Instructions (Signed)
 Medication Changes:  No Changes In Medications at this time.   Lab Work:  Labs done today, your results will be available in MyChart, we will contact you for abnormal readings.  Follow-Up in: 3 months as scheduled with APP   At the Advanced Heart Failure Clinic, you and your health needs are our priority. We have a designated team specialized in the treatment of Heart Failure. This Care Team includes your primary Heart Failure Specialized Cardiologist (physician), Advanced Practice Providers (APPs- Physician Assistants and Nurse Practitioners), and Pharmacist who all work together to provide you with the care you need, when you need it.   You may see any of the following providers on your designated Care Team at your next follow up:  Dr. Arvilla Meres Dr. Marca Ancona Dr. Dorthula Nettles Dr. Theresia Bough Tonye Becket, NP Robbie Lis, [place] [facility] [place], [place] Brynda Peon, NP [place] Lee, NP Karle Plumber, PharmD   Please be sure to bring in all your medications bottles to every appointment.   Need to Contact us:  If you have any questions or concerns before your next appointment please send [place] a message through [place] or call our office at [phone].    TO LEAVE A MESSAGE FOR THE NURSE SELECT OPTION 2, PLEASE LEAVE A MESSAGE INCLUDING: YOUR NAME DATE OF BIRTH CALL BACK NUMBER REASON FOR CALL**this is important as we prioritize the call backs  YOU WILL RECEIVE A CALL BACK THE SAME DAY AS LONG AS YOU CALL BEFORE 4:00 PM

## 2023-12-18 ENCOUNTER — Other Ambulatory Visit (HOSPITAL_BASED_OUTPATIENT_CLINIC_OR_DEPARTMENT_OTHER): Payer: Self-pay

## 2023-12-25 ENCOUNTER — Other Ambulatory Visit (HOSPITAL_BASED_OUTPATIENT_CLINIC_OR_DEPARTMENT_OTHER): Payer: Self-pay

## 2023-12-27 ENCOUNTER — Other Ambulatory Visit (HOSPITAL_BASED_OUTPATIENT_CLINIC_OR_DEPARTMENT_OTHER): Payer: Self-pay

## 2023-12-27 MED ORDER — NITROFURANTOIN MONOHYD MACRO 100 MG PO CAPS
100.0000 mg | ORAL_CAPSULE | Freq: Two times a day (BID) | ORAL | 0 refills | Status: DC
  Filled 2023-12-27: qty 14, 7d supply, fill #0

## 2023-12-27 MED ORDER — DEBROX 6.5 % OT SOLN
5.0000 [drp] | Freq: Two times a day (BID) | OTIC | 0 refills | Status: DC
  Filled 2023-12-27: qty 15, 30d supply, fill #0

## 2024-01-05 ENCOUNTER — Encounter: Payer: Self-pay | Admitting: Internal Medicine

## 2024-01-07 NOTE — Progress Notes (Signed)
 Remote ICD transmission.

## 2024-02-05 ENCOUNTER — Other Ambulatory Visit (HOSPITAL_BASED_OUTPATIENT_CLINIC_OR_DEPARTMENT_OTHER): Payer: Self-pay

## 2024-02-28 ENCOUNTER — Ambulatory Visit (INDEPENDENT_AMBULATORY_CARE_PROVIDER_SITE_OTHER): Payer: Medicare Other

## 2024-02-28 DIAGNOSIS — I5042 Chronic combined systolic (congestive) and diastolic (congestive) heart failure: Secondary | ICD-10-CM

## 2024-02-28 DIAGNOSIS — I447 Left bundle-branch block, unspecified: Secondary | ICD-10-CM

## 2024-02-29 ENCOUNTER — Ambulatory Visit: Payer: Self-pay | Admitting: Cardiology

## 2024-02-29 LAB — CUP PACEART REMOTE DEVICE CHECK
Battery Remaining Longevity: 108 mo
Battery Remaining Percentage: 100 %
Brady Statistic RA Percent Paced: 39 %
Brady Statistic RV Percent Paced: 95 %
Date Time Interrogation Session: 20250519003000
HighPow Impedance: 51 Ohm
Implantable Lead Connection Status: 753985
Implantable Lead Connection Status: 753985
Implantable Lead Connection Status: 753985
Implantable Lead Implant Date: 20230927
Implantable Lead Implant Date: 20240816
Implantable Lead Implant Date: 20240816
Implantable Lead Location: 753858
Implantable Lead Location: 753859
Implantable Lead Location: 753860
Implantable Lead Model: 511212
Implantable Lead Model: 672
Implantable Lead Model: 7841
Implantable Lead Serial Number: 1479461
Implantable Lead Serial Number: 238954
Implantable Lead Serial Number: 324875
Implantable Pulse Generator Implant Date: 20240816
Lead Channel Impedance Value: 417 Ohm
Lead Channel Impedance Value: 438 Ohm
Lead Channel Impedance Value: 631 Ohm
Lead Channel Pacing Threshold Amplitude: 0.4 V
Lead Channel Pacing Threshold Amplitude: 0.5 V
Lead Channel Pacing Threshold Amplitude: 1.6 V
Lead Channel Pacing Threshold Pulse Width: 0.4 ms
Lead Channel Pacing Threshold Pulse Width: 0.4 ms
Lead Channel Pacing Threshold Pulse Width: 0.4 ms
Lead Channel Setting Pacing Amplitude: 2.5 V
Lead Channel Setting Pacing Amplitude: 2.5 V
Lead Channel Setting Pacing Amplitude: 2.5 V
Lead Channel Setting Pacing Pulse Width: 0.4 ms
Lead Channel Setting Pacing Pulse Width: 0.4 ms
Lead Channel Setting Sensing Sensitivity: 0.5 mV
Lead Channel Setting Sensing Sensitivity: 1 mV
Pulse Gen Serial Number: 519842
Zone Setting Status: 755011

## 2024-03-01 ENCOUNTER — Other Ambulatory Visit (HOSPITAL_COMMUNITY): Payer: Self-pay | Admitting: Internal Medicine

## 2024-03-07 ENCOUNTER — Other Ambulatory Visit (HOSPITAL_BASED_OUTPATIENT_CLINIC_OR_DEPARTMENT_OTHER): Payer: Self-pay

## 2024-03-07 MED ORDER — SPIRONOLACTONE 25 MG PO TABS
25.0000 mg | ORAL_TABLET | Freq: Every day | ORAL | 3 refills | Status: AC
  Filled 2024-03-07: qty 90, 90d supply, fill #0
  Filled 2024-06-23 – 2024-06-24 (×2): qty 90, 90d supply, fill #1
  Filled 2024-09-29: qty 90, 90d supply, fill #2

## 2024-03-13 ENCOUNTER — Other Ambulatory Visit (HOSPITAL_BASED_OUTPATIENT_CLINIC_OR_DEPARTMENT_OTHER): Payer: Self-pay

## 2024-03-14 ENCOUNTER — Other Ambulatory Visit (HOSPITAL_BASED_OUTPATIENT_CLINIC_OR_DEPARTMENT_OTHER): Payer: Self-pay

## 2024-03-15 ENCOUNTER — Telehealth (HOSPITAL_COMMUNITY): Payer: Self-pay

## 2024-03-15 NOTE — Progress Notes (Signed)
 [facility] Note    PCP: Yolanda Hence, MD Primary Cardiologist: Maudine Sos, MD  HF Cardiologist: Dr. Julane Ny   Chief Complaint: Heart Failure Follow-up HPI: 68 y.o. male with past medication history of mild cognitive impairment and anxiety. He was found to have severe LV dysfunction in setting of markedly dilated aortic root (67 mm) and severe AI (tricuspid AoV). Echo 9/23 EF 25-30%, LV severely dilated, severe AI, Global HK, RV ok. LBBB also noted. [facility] 9/23 showed single vessel CAD w/ 60-70% pRCA stenosis, patent LM, LAD and LCx. RHC w/ low filling pressures and preserved CO, CI 2.53.    Underwent Bentall procedure + CABG x 1, SVR-RCA, on 07/07/22. Hospital course complicated by post-cardiotomy shock slow to wean off pressors, severe ileus/constipation, urinary retention/ obstruction requiring placement of foley catheter, atrial fibrillation requiring DCCV, RUE PICC Line Associated DVT, deconditioning and sacral wound.   Echo 5/24: EF 20-25%, RV moderately reduced, normal functioning aortic valve prosthesis  Underwent placement of CRT-D on 05/28/23.  Echo 11/24: EF 40-45%, RV normal, no AI/AS, aorta dilation 42mm.  Today he returns for AHF follow up. Overall feeling ***. Denies palpitations, CP, dizziness, edema, or PND/Orthopnea. *** SOB. Appetite ok. No fever or chills. Weight at home *** pounds. Taking all medications. Denies ETOH, tobacco or drug use.   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     amoxicillin  (AMOXIL ) 500 MG capsule Take 4 capsules (2,000 mg total) by mouth 1 hour prior to dental procedure. 12 capsule 0   apixaban  (ELIQUIS ) 5 MG TABS tablet Take 1 tablet (5 mg total) by mouth 2 (two) times daily. 60 tablet 11   ascorbic acid  (VITAMIN C ) 500 MG tablet Take 500 mg by mouth in the morning.     aspirin  EC 81 MG tablet Take 1 tablet (81 mg total) by mouth daily. Swallow whole.     atorvastatin  (LIPITOR ) 80 MG tablet Take 1 tablet (80 mg total) by mouth daily. 90 tablet 3   B Complex Vitamins (VITAMIN B COMPLEX) TABS Take 1 tablet by mouth daily.     carbamide peroxide (DEBROX) 6.5 % OTIC solution Place 5 drops into affected ear(s) 2 (two) times daily. 15 mL 0   carvedilol  (COREG ) 3.125 MG tablet Take 1 tablet (3.125 mg total) by mouth 2 (two) times daily. 60 tablet 3   Cholecalciferol (VITAMIN D3) 250 MCG (10000 UT) capsule Take  10,000 Units by mouth in the morning.     Coenzyme Q10 (CO Q-10) 100 MG CAPS Take 100 mg by mouth in the morning and at bedtime.     Magnesium  300 MG CAPS Take 300 mg by mouth daily.     melatonin 5 MG TABS Take 5 mg by mouth at bedtime as needed (sleep).     Multiple Vitamins-Minerals (ZINC PO) Take 60 mg by mouth in the morning.     nitrofurantoin , macrocrystal-monohydrate, (MACROBID ) 100 MG capsule Take 1 capsule (100 mg total) by mouth every 12 (twelve) hours. 14 capsule 0   Omega-3 Fatty Acids (SUPER TWIN EPA/DHA PO) Take 720 mg by mouth in the morning and at  bedtime.     pantoprazole  (PROTONIX ) 40 MG tablet Take 1 tablet every day by oral route as needed. 30 tablet 3   sacubitril -valsartan  (ENTRESTO ) 24-26 MG Take 1 tablet by mouth 2 (two) times daily. 60 tablet 11   spironolactone  (ALDACTONE ) 25 MG tablet Take 1 tablet (25 mg total) by mouth daily. 90 tablet 3   tamsulosin  (FLOMAX ) 0.4 MG CAPS capsule Take 1 capsule (0.4 mg total) by mouth 2 (two) times daily. 120 capsule 3   No current facility-administered medications for this visit.   Allergies  Allergen Reactions   Codeine Nausea Only   Levaquin  [Levofloxacin  In D5w] Diarrhea    Social History   Socioeconomic History   Marital status: Single    Spouse name: Not on file   Number of children: Not on file   Years of education: Not on file   Highest education level: Not on file  Occupational History   Occupation: unemployed  Tobacco Use   Smoking status: Never   Smokeless tobacco: Never  Vaping Use   Vaping status: Never Used  Substance and Sexual Activity   Alcohol use: No   Drug use: No   Sexual activity: Not Currently  Other Topics Concern   Not on file  Social History Narrative   Pt with delayed mental capabilities, 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   Social Drivers of Corporate investment banker Strain: Low Risk  (10/11/2019)   Overall Financial Resource Strain (CARDIA)    Difficulty of Paying Living Expenses: Not hard at all  Food Insecurity: No Food Insecurity (05/28/2023)   Hunger Vital Sign    Worried About Running Out of Food in the Last Year: Never true    Ran Out of Food in the Last Year: Never true  Transportation Needs: No Transportation Needs (05/28/2023)   PRAPARE - Administrator, Civil Service (Medical): No    Lack of Transportation (Non-Medical): No  Physical Activity: Sufficiently Active (10/11/2019)   Exercise Vital Sign     Days of Exercise per Week: 5 days    Minutes of Exercise per Session: 40 min  Stress: Stress Concern Present (10/11/2019)   [employer] of Occupational Health - Occupational Stress Questionnaire    Feeling of Stress : To some extent  Social Connections: Not on file  Intimate Partner Violence: Not At Risk (05/28/2023)   Humiliation, Afraid, Rape, and Kick questionnaire    Fear of Current or Ex-Partner: No    Emotionally Abused: No    Physically Abused: No    Sexually Abused: No   Family History  Problem Relation Age of Onset  Heart failure Mother    Atrial fibrillation Mother    Heart failure Maternal Grandmother    Atrial fibrillation Maternal Grandmother    There were no vitals taken for this visit.  Wt Readings from Last 3 Encounters:  12/15/23 77 kg (169 lb 12.8 oz)  09/28/23 75 kg (165 lb 6.4 oz)  09/02/23 76.6 kg (168 lb 12.8 oz)   PHYSICAL EXAM: General:  *** appearing.  No respiratory difficulty HEENT: normal Neck: supple. JVD *** cm.  Cor: PMI nondisplaced. Regular rate & rhythm. No rubs, gallops or murmurs. Lungs: clear Abdomen: soft, nontender, nondistended. Good bowel sounds. Extremities: no cyanosis, clubbing, rash, edema  Neuro: alert & oriented x 3. Moves all 4 extremities w/o difficulty. Affect pleasant.   Device Interrogation: HL score 0, mean HR 71, activity level 1.0 h/d, 94% LV paced, AT/AF <1%  (personally reviewed) ***  ASSESSMENT & PLAN: 1. Chronic Systolic Heart Failure  - NICM, severity out of proportion to degree of CAD, likely valvular CM in setting of severe AI +/- LBBB CM  - Echo 9/23 EF 25-30%, LV severely dilated, severe AI, Global HK, RV ok  - s/p bioprosthetic AVR + CABG 9/23; epicardial CRT wires placed during surgery. - Echo (5/24): EF 20-25%, RV moderately reduced, normal functioning aortic valve prosthesis - S/p CRT-D 08/24. - Echo (11/24): improved EF 40-45, nl RV, no AI/AS, aorta dilation 42mm. - NYHA I-II Volume stable on  exam and device interrogation. HL score 0  - Continue Lasix  PRN. No needing - Continue spiro 25 mg daily - Continue Entresto  24/26 mg bid. No BP room for GDMT titration. - Continue Coreg  3.125 mg BID - No SGLT2i given h/o urinary retention/UTI and sacral wound hx  - Check BMP/BNP today   2. Ascending Aortic Aneurysm/ Severe AI  - s/p Bentall with bioprosthetic aortic valve 9/23. - Echo 11/23, 5/24, and 11/24 normal functioning aortic valve prosthesis.  3. CAD - s/p CABG x 1 (SVG-RCA) in 09/23 - Stable w/o CP  - Continue ASA + statin + ? blocker   4. LBBB - Now s/p CRT-D - Device interrogation today shows 94% LV pacing  5. Paroxysmal Atrial fib/flutter - s/p DCCV 9/23 - Regular on exam - <1%% AT/AF burden on device interrogation - Continue Eliquis  5 mg bid. No bleeding issues.  6. Mitral Regurgitation  - Mod-severe on echo 10/23, in setting of LBBB. - no MR on echo 11/24  Follow up: in 3 months with APP ***  Sheryl Donna, NP  11:31 AM

## 2024-03-15 NOTE — Telephone Encounter (Signed)
 Called to confirm/remind patient of their appointment at the [facility] on 03/16/24.   Appointment:   [] Confirmed  [] Left mess   [] No answer/No voice mail  [] VM Full/unable to leave message  [x] Phone not in service

## 2024-03-16 ENCOUNTER — Ambulatory Visit (HOSPITAL_COMMUNITY)
Admission: RE | Admit: 2024-03-16 | Discharge: 2024-03-16 | Disposition: A | Discharge status: Home / Self Care | Source: Ambulatory Visit | Attending: Internal Medicine | Admitting: Internal Medicine

## 2024-03-16 ENCOUNTER — Other Ambulatory Visit (HOSPITAL_BASED_OUTPATIENT_CLINIC_OR_DEPARTMENT_OTHER): Payer: Self-pay

## 2024-03-16 ENCOUNTER — Ambulatory Visit (HOSPITAL_COMMUNITY): Payer: Self-pay | Admitting: Internal Medicine

## 2024-03-16 ENCOUNTER — Encounter (HOSPITAL_COMMUNITY): Payer: Self-pay

## 2024-03-16 VITALS — BP 112/73 | HR 73 | Resp 16 | Wt 182.8 lb

## 2024-03-16 DIAGNOSIS — I428 Other cardiomyopathies: Secondary | ICD-10-CM | POA: Insufficient documentation

## 2024-03-16 DIAGNOSIS — Z7982 Long term (current) use of aspirin: Secondary | ICD-10-CM | POA: Diagnosis not present

## 2024-03-16 DIAGNOSIS — F70 Mild intellectual disabilities: Secondary | ICD-10-CM | POA: Diagnosis not present

## 2024-03-16 DIAGNOSIS — I110 Hypertensive heart disease with heart failure: Secondary | ICD-10-CM | POA: Insufficient documentation

## 2024-03-16 DIAGNOSIS — Z7901 Long term (current) use of anticoagulants: Secondary | ICD-10-CM | POA: Diagnosis not present

## 2024-03-16 DIAGNOSIS — I480 Paroxysmal atrial fibrillation: Secondary | ICD-10-CM

## 2024-03-16 DIAGNOSIS — I7121 Aneurysm of the ascending aorta, without rupture: Secondary | ICD-10-CM | POA: Diagnosis not present

## 2024-03-16 DIAGNOSIS — Z87440 Personal history of urinary (tract) infections: Secondary | ICD-10-CM | POA: Insufficient documentation

## 2024-03-16 DIAGNOSIS — Z953 Presence of xenogenic heart valve: Secondary | ICD-10-CM | POA: Insufficient documentation

## 2024-03-16 DIAGNOSIS — I2510 Atherosclerotic heart disease of native coronary artery without angina pectoris: Secondary | ICD-10-CM

## 2024-03-16 DIAGNOSIS — Z79899 Other long term (current) drug therapy: Secondary | ICD-10-CM | POA: Diagnosis not present

## 2024-03-16 DIAGNOSIS — Z951 Presence of aortocoronary bypass graft: Secondary | ICD-10-CM | POA: Diagnosis not present

## 2024-03-16 DIAGNOSIS — I351 Nonrheumatic aortic (valve) insufficiency: Secondary | ICD-10-CM | POA: Insufficient documentation

## 2024-03-16 DIAGNOSIS — I340 Nonrheumatic mitral (valve) insufficiency: Secondary | ICD-10-CM

## 2024-03-16 DIAGNOSIS — I714 Abdominal aortic aneurysm, without rupture, unspecified: Secondary | ICD-10-CM

## 2024-03-16 DIAGNOSIS — I4892 Unspecified atrial flutter: Secondary | ICD-10-CM | POA: Insufficient documentation

## 2024-03-16 DIAGNOSIS — F419 Anxiety disorder, unspecified: Secondary | ICD-10-CM | POA: Diagnosis not present

## 2024-03-16 DIAGNOSIS — I5022 Chronic systolic (congestive) heart failure: Secondary | ICD-10-CM

## 2024-03-16 DIAGNOSIS — I447 Left bundle-branch block, unspecified: Secondary | ICD-10-CM

## 2024-03-16 LAB — BRAIN NATRIURETIC PEPTIDE: B Natriuretic Peptide: 55.5 pg/mL (ref 0.0–100.0)

## 2024-03-16 LAB — BASIC METABOLIC PANEL WITH GFR
Anion gap: 5 (ref 5–15)
BUN: 13 mg/dL (ref 8–23)
CO2: 27 mmol/L (ref 22–32)
Calcium: 9.6 mg/dL (ref 8.9–10.3)
Chloride: 106 mmol/L (ref 98–111)
Creatinine, Ser: 0.77 mg/dL (ref 0.61–1.24)
GFR, Estimated: >60 mL/min (ref >60)
Glucose, Bld: 100 mg/dL — ABNORMAL HIGH (ref 70–99)
Potassium: 4.2 mmol/L (ref 3.5–5.1)
Sodium: 138 mmol/L (ref 135–145)

## 2024-03-16 MED ORDER — FUROSEMIDE 20 MG PO TABS
20.0000 mg | ORAL_TABLET | ORAL | 3 refills | Status: DC | PRN
  Filled 2024-03-16: qty 30, 30d supply, fill #0

## 2024-03-16 NOTE — Patient Instructions (Addendum)
 TAKE 40 mg of lasix  for the next 2 days only, after that you may take 1 tablet for weight gain of 3lb in 24 hours or 5 lb in a week.  Labs done today, your results will be available in MyChart, we will contact you for abnormal readings.  Your physician recommends that you schedule a follow-up appointment in: 2 months.  If you have any questions or concerns before your next appointment please send us  a message through [place] or call our office at [phone].    TO LEAVE A MESSAGE FOR THE NURSE SELECT OPTION 2, PLEASE LEAVE A MESSAGE INCLUDING: YOUR NAME DATE OF BIRTH CALL BACK NUMBER REASON FOR CALL**this is important as we prioritize the call backs  YOU WILL RECEIVE A CALL BACK THE SAME DAY AS LONG AS YOU CALL BEFORE 4:00 PM  At the [facility], you and your health needs are our priority. As part of our continuing mission to provide you with exceptional heart care, we have created designated Provider Care Teams. These Care Teams include your primary Cardiologist (physician) and Advanced Practice Providers (APPs- Physician Assistants and Nurse Practitioners) who all work together to provide you with the care you need, when you need it.   You may see any of the following providers on your designated Care Team at your next follow up: Dr Jules Oar Dr Peder Bourdon Dr. Alwin Baars Dr. Arta Lark Amy Marijane Shoulders, NP Ruddy Corral, [place] [facility] [place], [place] Dennise Fitz, NP [place] Lee, NP Shawnee Dellen, NP Luster Salters, PharmD Bevely Brush, PharmD   Please be sure to bring in all your medications bottles to every appointment.    Thank you for choosing [facility]

## 2024-03-27 ENCOUNTER — Other Ambulatory Visit (HOSPITAL_BASED_OUTPATIENT_CLINIC_OR_DEPARTMENT_OTHER): Payer: Self-pay

## 2024-04-19 NOTE — Progress Notes (Signed)
 Remote ICD transmission.

## 2024-04-19 NOTE — Addendum Note (Signed)
 Addended by: TAWNI DRILLING D on: 04/19/2024 04:01 PM   Modules accepted: Orders

## 2024-04-21 ENCOUNTER — Other Ambulatory Visit (HOSPITAL_BASED_OUTPATIENT_CLINIC_OR_DEPARTMENT_OTHER): Payer: Self-pay

## 2024-04-28 ENCOUNTER — Emergency Department (HOSPITAL_BASED_OUTPATIENT_CLINIC_OR_DEPARTMENT_OTHER)
Admission: EM | Admit: 2024-04-28 | Discharge: 2024-04-28 | Disposition: A | Discharge status: Home / Self Care | Attending: Emergency Medicine | Admitting: Emergency Medicine

## 2024-04-28 ENCOUNTER — Emergency Department (HOSPITAL_BASED_OUTPATIENT_CLINIC_OR_DEPARTMENT_OTHER)

## 2024-04-28 ENCOUNTER — Encounter (HOSPITAL_BASED_OUTPATIENT_CLINIC_OR_DEPARTMENT_OTHER): Payer: Self-pay

## 2024-04-28 ENCOUNTER — Other Ambulatory Visit (HOSPITAL_BASED_OUTPATIENT_CLINIC_OR_DEPARTMENT_OTHER): Payer: Self-pay

## 2024-04-28 DIAGNOSIS — I10 Essential (primary) hypertension: Secondary | ICD-10-CM | POA: Diagnosis not present

## 2024-04-28 DIAGNOSIS — K429 Umbilical hernia without obstruction or gangrene: Secondary | ICD-10-CM | POA: Diagnosis not present

## 2024-04-28 DIAGNOSIS — N390 Urinary tract infection, site not specified: Secondary | ICD-10-CM | POA: Diagnosis not present

## 2024-04-28 DIAGNOSIS — I2510 Atherosclerotic heart disease of native coronary artery without angina pectoris: Secondary | ICD-10-CM | POA: Diagnosis not present

## 2024-04-28 DIAGNOSIS — D72829 Elevated white blood cell count, unspecified: Secondary | ICD-10-CM | POA: Diagnosis not present

## 2024-04-28 DIAGNOSIS — Z7901 Long term (current) use of anticoagulants: Secondary | ICD-10-CM | POA: Diagnosis not present

## 2024-04-28 DIAGNOSIS — R519 Headache, unspecified: Secondary | ICD-10-CM | POA: Insufficient documentation

## 2024-04-28 DIAGNOSIS — R109 Unspecified abdominal pain: Secondary | ICD-10-CM | POA: Diagnosis present

## 2024-04-28 LAB — COMPREHENSIVE METABOLIC PANEL WITH GFR
ALT: 36 U/L (ref 0–44)
AST: 28 U/L (ref 15–41)
Albumin: 4.5 g/dL (ref 3.5–5.0)
Alkaline Phosphatase: 102 U/L (ref 38–126)
Anion gap: 11 (ref 5–15)
BUN: 17 mg/dL (ref 8–23)
CO2: 25 mmol/L (ref 22–32)
Calcium: 9.8 mg/dL (ref 8.9–10.3)
Chloride: 105 mmol/L (ref 98–111)
Creatinine, Ser: 0.92 mg/dL (ref 0.61–1.24)
GFR, Estimated: >60 mL/min (ref >60)
Glucose, Bld: 104 mg/dL — ABNORMAL HIGH (ref 70–99)
Potassium: 4 mmol/L (ref 3.5–5.1)
Sodium: 141 mmol/L (ref 135–145)
Total Bilirubin: 0.9 mg/dL (ref 0.0–1.2)
Total Protein: 7.3 g/dL (ref 6.5–8.1)

## 2024-04-28 LAB — URINALYSIS, ROUTINE W REFLEX MICROSCOPIC
Bilirubin Urine: NEGATIVE
Glucose, UA: NEGATIVE mg/dL
Ketones, ur: NEGATIVE mg/dL
Nitrite: NEGATIVE
Protein, ur: NEGATIVE mg/dL
Specific Gravity, Urine: <1.005 — ABNORMAL LOW (ref 1.005–1.030)
WBC, UA: >50 WBC/hpf (ref 0–5)
pH: 7 (ref 5.0–8.0)

## 2024-04-28 LAB — CBC
HCT: 43.8 % (ref 39.0–52.0)
Hemoglobin: 15.4 g/dL (ref 13.0–17.0)
MCH: 35.6 pg — ABNORMAL HIGH (ref 26.0–34.0)
MCHC: 35.2 g/dL (ref 30.0–36.0)
MCV: 101.4 fL — ABNORMAL HIGH (ref 80.0–100.0)
Platelets: 177 K/uL (ref 150–400)
RBC: 4.32 MIL/uL (ref 4.22–5.81)
RDW: 12.9 % (ref 11.5–15.5)
WBC: 13.1 K/uL — ABNORMAL HIGH (ref 4.0–10.5)
nRBC: 0 % (ref 0.0–0.2)

## 2024-04-28 LAB — LIPASE, BLOOD: Lipase: 27 U/L (ref 11–51)

## 2024-04-28 MED ORDER — SODIUM CHLORIDE 0.9 % IV SOLN
1.0000 g | Freq: Once | INTRAVENOUS | Status: AC
  Administered 2024-04-28: 1 g via INTRAVENOUS
  Filled 2024-04-28: qty 10

## 2024-04-28 MED ORDER — CEPHALEXIN 500 MG PO CAPS
500.0000 mg | ORAL_CAPSULE | Freq: Three times a day (TID) | ORAL | 0 refills | Status: AC
  Filled 2024-04-28: qty 21, 7d supply, fill #0

## 2024-04-28 MED ORDER — ONDANSETRON 4 MG PO TBDP
4.0000 mg | ORAL_TABLET | Freq: Three times a day (TID) | ORAL | 0 refills | Status: AC | PRN
  Filled 2024-04-28: qty 20, 7d supply, fill #0

## 2024-04-28 MED ORDER — IOHEXOL 300 MG/ML  SOLN
100.0000 mL | Freq: Once | INTRAMUSCULAR | Status: AC | PRN
  Administered 2024-04-28: 100 mL via INTRAVENOUS

## 2024-04-28 NOTE — ED Provider Notes (Signed)
 [place] EMERGENCY DEPARTMENT AT [facility] Provider Note   CSN: [id number] Arrival date & time: 04/28/24  1048     Patient presents with: No chief complaint on file.   Joe [name] is a 68 y.o. male.   HPI     68 year old male with history of congenital brain damage, PVCs, thoracic ascending aortic aneurysm, left bundle branch block, coronary artery disease, hypertension, atrial fibrillation on eliquis , CHF mostly felt to be nonischemic, who presents with headache and abdominal discomfort.   Back of head began to have pain, last night, took some advil, helped the headache some 10/10 last night, has eased off since last night but still there.  No numbness or weaknesss, no change in vision, trouble talking or walking.  Dizzy a little bit, lightheaded feeling, not having that now.  HA did start suddenly.    No nausea or vomiting.    No fever.   A little bit, mildler central abdominal pain, that has improved.  No diarrhea or constipation . No urinary symptoms.     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 Authorizing  Provider  cephALEXin  (KEFLEX ) 500 MG capsule Take 1 capsule (500 mg total) by mouth 3 (three) times daily for 7 days. 04/28/24 05/05/24 Yes Dreama Longs, MD  ondansetron  (ZOFRAN -ODT) 4 MG disintegrating tablet Take 1 tablet (4 mg total) by mouth every 8 (eight) hours as needed for nausea or vomiting. 04/28/24  Yes Dreama Longs, MD  acetaminophen  (TYLENOL ) 650 MG CR tablet Take 650 mg by mouth every 8 (eight) hours as needed for pain.    [provider]  amoxicillin  (AMOXIL ) 500 MG capsule Take 4 capsules (2,000 mg total) by mouth 1 hour prior to dental procedure. 06/16/23     apixaban  (ELIQUIS ) 5 MG TABS tablet Take 1 tablet (5 mg total) by mouth 2 (two) times daily. 10/29/23   Milford, Harlene HERO, FNP  ascorbic acid  (VITAMIN C ) 500 MG tablet Take 500 mg by mouth in the morning.    [provider]  atorvastatin  (LIPITOR ) 80 MG tablet Take 1 tablet (80 mg total) by mouth daily. 07/26/23   Raford Riggs, MD  B Complex Vitamins (VITAMIN B COMPLEX) TABS Take 1 tablet by mouth daily.    [provider]  carbamide peroxide (DEBROX) 6.5 % OTIC solution Place 5 drops into affected ear(s) 2 (two) times daily. 12/27/23     carvedilol  (COREG ) 3.125 MG tablet Take 1 tablet (3.125 mg total) by mouth 2 (two) times daily.  10/18/23   Colletta Manuelita Garre, PA-C  Cholecalciferol (VITAMIN D3) 250 MCG (10000 UT) capsule Take 10,000 Units by mouth in the morning.    [provider]  Coenzyme Q10 (CO Q-10) 100 MG CAPS Take 100 mg by mouth in the morning and at bedtime.    [provider]  furosemide  (LASIX ) 20 MG tablet Take 1 tablet (20 mg total) by mouth as needed for fluid or edema. Weight gain of 3 lb in 24 hours or 5 lb in a week 03/16/24   Hayes Beckey CROME, NP  Magnesium  300 MG CAPS Take 300 mg by mouth daily.    [provider]  melatonin 5 MG TABS Take 5 mg by mouth at bedtime as needed (sleep).    [provider]  Multiple Vitamins-Minerals (ZINC PO)  Take 60 mg by mouth in the morning.    [provider]  Omega-3 Fatty Acids (SUPER TWIN EPA/DHA PO) Take 720 mg by mouth in the morning and at bedtime.    [provider]  pantoprazole  (PROTONIX ) 40 MG tablet Take 1 tablet every day by oral route as needed. 04/21/23     sacubitril -valsartan  (ENTRESTO ) 24-26 MG Take 1 tablet by mouth 2 (two) times daily. 12/13/23   Milford, Harlene HERO, FNP  spironolactone  (ALDACTONE ) 25 MG tablet Take 1 tablet (25 mg total) by mouth daily. 03/07/24   Bensimhon, Toribio SAUNDERS, MD  tamsulosin  (FLOMAX ) 0.4 MG CAPS capsule Take 1 capsule (0.4 mg total) by mouth 2 (two) times daily. 10/20/23       Allergies: Codeine and Levaquin  [levofloxacin  in d5w]    Review of Systems  Updated Vital Signs BP 139/83   Pulse 65   Temp 98.8 F (37.1 C) (Oral)   Resp 18   SpO2 99%   Physical Exam Vitals and nursing note reviewed.  Constitutional:      General: He is not in acute distress.    Appearance: Normal appearance. He is well-developed. He is not ill-appearing or diaphoretic.  HENT:     Head: Normocephalic and atraumatic.  Eyes:     General: No visual field deficit.    Extraocular Movements: Extraocular movements intact.     Conjunctiva/sclera: Conjunctivae normal.     Pupils: Pupils are equal, round, and reactive to light.  Cardiovascular:     Rate and Rhythm: Normal rate and regular rhythm.     Pulses: Normal pulses.     Heart sounds: Normal heart sounds. No murmur heard.    No friction rub. No gallop.  Pulmonary:     Effort: Pulmonary effort is normal. No respiratory distress.     Breath sounds: Normal breath sounds. No wheezing or rales.  Abdominal:     General: There is no distension.     Palpations: Abdomen is soft.     Tenderness: There is abdominal tenderness (tendernss at umbilical hernia, appears reducible on exam but is tender). There is no guarding.  Musculoskeletal:        General: No swelling or tenderness.     Cervical back: Normal  range of motion.  Skin:    General: Skin is warm and dry.     Findings: No erythema or rash.  Neurological:     General: No focal deficit present.     Mental Status: He is alert and oriented to person, place, and time.     GCS: GCS eye subscore is 4. GCS verbal subscore is 5. GCS motor subscore is 6.  Cranial Nerves: No cranial nerve deficit, dysarthria or facial asymmetry.     Sensory: No sensory deficit.     Motor: No weakness or tremor.     Coordination: Coordination normal. Finger-Nose-Finger Test normal.     Gait: Gait normal.     (all labs ordered are listed, but only abnormal results are displayed) Labs Reviewed  COMPREHENSIVE METABOLIC PANEL WITH GFR - Abnormal; Notable for the following components:      Result Value   Glucose, Bld 104 (*)    All other components within normal limits  CBC - Abnormal; Notable for the following components:   WBC 13.1 (*)    MCV 101.4 (*)    MCH 35.6 (*)    All other components within normal limits  URINALYSIS, ROUTINE W REFLEX MICROSCOPIC - Abnormal; Notable for the following components:   APPearance HAZY (*)    Specific Gravity, Urine <1.005 (*)    Hgb urine dipstick TRACE (*)    Leukocytes,Ua LARGE (*)    Bacteria, UA RARE (*)    All other components within normal limits  LIPASE, BLOOD    EKG: None  Radiology: CT ABDOMEN PELVIS W CONTRAST Result Date: 04/28/2024 CLINICAL DATA:  Anterior abdominal wall tenderness and suspected hernia. EXAM: CT ABDOMEN AND PELVIS WITH CONTRAST TECHNIQUE: Multidetector CT imaging of the abdomen and pelvis was performed using the standard protocol following bolus administration of intravenous contrast. RADIATION DOSE REDUCTION: This exam was performed according to the departmental dose-optimization program which includes automated exposure control, adjustment of the mA and/or kV according to patient size and/or use of iterative reconstruction technique. CONTRAST:  OMNIPAQUE  IOHEXOL  300 MG/ML   SOLN COMPARISON:  09/25/2022 FINDINGS: Despite efforts by the technologist and patient, motion artifact is present on today's exam and could not be eliminated. This reduces exam sensitivity and specificity. Lower chest: Aortic valve prosthesis. Pacer leads noted. Mild cardiomegaly. Mildly prominent main pulmonary artery could indicate pulmonary arterial hypertension. Mild descending thoracic aortic atherosclerosis. Mild scarring or atelectasis in both lower lobes. Hepatobiliary: Contracted gallbladder.  Otherwise unremarkable. Pancreas: Unremarkable Spleen: Unremarkable Adrenals/Urinary Tract: Both adrenal glands appear normal. Stable renal cysts warrant no further imaging follow up. Stomach/Bowel: Unremarkable Vascular/Lymphatic: Minimal aortoiliac atheromatous vascular calcification. Reproductive: Unremarkable Other: No supplemental non-categorized findings. Musculoskeletal: Bilateral total hip prostheses. Posterolateral rod and pedicle screw fixation at L4-5 with solid interbody fusion and posterior decompression. Thoracolumbar spondylosis. Grade 1 degenerative retrolisthesis at L2-3 and L3-4. Very small umbilical hernia contains adipose tissue with small-bowel building towards the hernia on image 58 series 3, but no strangulation or obstruction. Appearance not changed from 09/25/2022. IMPRESSION: 1. Very small umbilical hernia contains adipose tissue with small-bowel build towards the hernia, but no strangulation or obstruction. Appearance not changed from 09/25/2022. 2. Mild cardiomegaly. Mildly prominent main pulmonary artery could indicate pulmonary arterial hypertension. 3. Aortic valve prosthesis. 4. Bilateral total hip prostheses. 5. Thoracolumbar spondylosis and degenerative disc disease. Grade 1 degenerative retrolisthesis at L2-3 and L3-4. 6.  Aortic Atherosclerosis (ICD10-I70.0). Electronically Signed   By: Ryan Salvage M.D.   On: 04/28/2024 14:34   CT Head Wo Contrast Result Date:  04/28/2024 EXAM: CT HEAD WITHOUT CONTRAST 04/28/2024 02:12:34 PM TECHNIQUE: CT of the head was performed without the administration of intravenous contrast. Automated exposure control, iterative reconstruction, and/or weight based adjustment of the mA/kV was utilized to reduce the radiation dose to as low as reasonably achievable. COMPARISON: CT head 12/11/2012 CLINICAL HISTORY: Headache, new onset (Age >= 51y); on eliquis . Pt  has headache and possible hernia-poor historian-JLC FINDINGS: BRAIN AND VENTRICLES: No acute hemorrhage. Gray-white differentiation is preserved. No hydrocephalus. No extra-axial collection. No mass effect or midline shift. ORBITS: No acute abnormality. SINUSES: Unchanged osteoma in the inferior right frontal sinus. SOFT TISSUES AND SKULL: No acute soft tissue abnormality. No skull fracture. IMPRESSION: 1. No acute intracranial abnormality. Electronically signed by: Ryan Chess MD 04/28/2024 02:21 PM EDT RP Workstation: [id number]     Procedures   Medications Ordered in the ED  cefTRIAXone  (ROCEPHIN ) 1 g in sodium chloride  0.9 % 100 mL IVPB (0 g Intravenous Stopped 04/28/24 1329)  iohexol  (OMNIPAQUE ) 300 MG/ML solution 100 mL (100 mLs Intravenous Contrast Given 04/28/24 1358)                                     68 year old male with history of congenital brain damage, PVCs, thoracic ascending aortic aneurysm, left bundle branch block, coronary artery disease, hypertension, atrial fibrillation on eliquis , CHF mostly felt to be nonischemic, who presents with headache and abdominal discomfort.   Regarding headache:  CT head completed and shows no evidence of acute abnormalities. NO fever.   Regarding abdominal pain: DDx includes UTI, nephrolithiasis, obstruction, hernia incarcerated, pancreatitis, hepatitis.  Labs completed personally by and interpreted by me show a normal lipase, no signs of pancreatitis, no transaminitis, no clinically significant electrolyte  abnormalities.  White blood cell count is mildly elevated at 13,000.  Urinalysis is consistent with a urinary tract infection with large leukocytes and greater than 50 white blood cells  Given rocephin  for UTI.  CT abdomen pelvis completed given tenderness over hernia shows unchanged hernia on CT< no signs of obstruction or strangulation. Do not feel it is clinically incarcerated.  Recommend follow up with general surgery.  Given keflex , zofran  for UTI.  Recommend PCP and general surgery follow up . Patient discharged in stable condition with understanding of reasons to return.       Final diagnoses:  Urinary tract infection without hematuria, site unspecified  Acute nonintractable headache, unspecified headache type  Umbilical hernia without obstruction and without gangrene    ED Discharge Orders          Ordered    ondansetron  (ZOFRAN -ODT) 4 MG disintegrating tablet  Every 8 hours PRN        04/28/24 1445    cephALEXin  (KEFLEX ) 500 MG capsule  3 times daily        04/28/24 1445               Dreama Longs, MD 04/28/24 2252

## 2024-04-28 NOTE — ED Triage Notes (Signed)
 Pt c/o abd pain, tummyache, HA since last night. No urinary/ bowel complaints. No meds PTA

## 2024-05-15 ENCOUNTER — Telehealth (HOSPITAL_COMMUNITY): Payer: Self-pay

## 2024-05-15 NOTE — Telephone Encounter (Signed)
 Called to confirm/remind patient of their appointment at the [facility] on 05/16/24\.   Appointment:   [] Confirmed  [x] Left mess   [] No answer/No voice mail  [] VM Full/unable to leave message  [] Phone not in service  And to bring in all medications and/or complete list.

## 2024-05-15 NOTE — Progress Notes (Incomplete)
 Advanced Heart Failure Clinic Note    PCP: Theo Iha, MD Primary Cardiologist: Annabella Scarce, MD  HF Cardiologist: Dr. Cherrie   Chief Complaint: Heart Failure Follow-up HPI: 68 y.o. male with past medication history of mild cognitive impairment and anxiety. He was found to have severe LV dysfunction in setting of markedly dilated aortic root (67 mm) and severe AI (tricuspid AoV). Echo 9/23 EF 25-30%, LV severely dilated, severe AI, Global HK, RV ok. LBBB also noted. [facility] 9/23 showed single vessel CAD w/ 60-70% pRCA stenosis, patent LM, LAD and LCx. RHC w/ low filling pressures and preserved CO, CI 2.53.    Underwent Bentall procedure + CABG x 1, SVR-RCA, on 07/07/22. Hospital course complicated by post-cardiotomy shock slow to wean off pressors, severe ileus/constipation, urinary retention/ obstruction requiring placement of foley catheter, atrial fibrillation requiring DCCV, RUE PICC Line Associated DVT, deconditioning and sacral wound.   Echo 5/24: EF 20-25%, RV moderately reduced, normal functioning aortic valve prosthesis  Underwent placement of CRT-D on 05/28/23.  Echo 11/24: EF 40-45%, RV normal, no AI/AS, aorta dilation 42mm.  Today he returns for AHF follow up with his mom. Overall feeling great. Denies palpitations, CP, dizziness, edema, or PND/Orthopnea. No SOB. Appetite ok. No fever or chills. Does not weight at home. Taking all medications. Denies ETOH, tobacco or drug use. [facility] and exercises on the treadmill. [place] to [employer] several times a week. Rarely takes lasix .   He returns today for heart failure follow up. Overall feeling ***. NYHA ***. Reports {Symptoms; cardiac:12860::dyspnea,fatigue}. Denies {Symptoms; cardiac:12860::chest pain,dyspnea,fatigue,near-syncope,orthopnea,palpitations,dizziness,abnormal bleeding}. Able to perform ADLs. Appetite okay. Weight at home  ***. BP at home***. Compliant with all medications.   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     amoxicillin  (AMOXIL ) 500 MG capsule Take 4 capsules (2,000 mg total) by mouth 1 hour prior to dental procedure. 12 capsule 0   apixaban  (ELIQUIS ) 5 MG TABS tablet Take 1 tablet (5 mg total) by mouth 2 (two) times daily. 60 tablet 11   ascorbic acid  (VITAMIN C ) 500 MG tablet Take 500 mg by mouth in the morning.     atorvastatin  (LIPITOR ) 80 MG tablet Take 1 tablet (80 mg total) by mouth daily. 90 tablet 3   B Complex Vitamins (VITAMIN B COMPLEX) TABS Take 1 tablet by mouth daily.  carbamide peroxide (DEBROX) 6.5 % OTIC solution Place 5 drops into affected ear(s) 2 (two) times daily. 15 mL 0   carvedilol  (COREG ) 3.125 MG tablet Take 1 tablet (3.125 mg total) by mouth 2 (two) times daily. 60 tablet 3   Cholecalciferol (VITAMIN D3) 250 MCG (10000 UT) capsule Take 10,000 Units by mouth in the morning.     Coenzyme Q10 (CO Q-10) 100 MG CAPS Take 100 mg by mouth in  the morning and at bedtime.     furosemide  (LASIX ) 20 MG tablet Take 1 tablet (20 mg total) by mouth as needed for fluid or edema. Weight gain of 3 lb in 24 hours or 5 lb in a week 30 tablet 3   Magnesium  300 MG CAPS Take 300 mg by mouth daily.     melatonin 5 MG TABS Take 5 mg by mouth at bedtime as needed (sleep).     Multiple Vitamins-Minerals (ZINC PO) Take 60 mg by mouth in the morning.     Omega-3 Fatty Acids (SUPER TWIN EPA/DHA PO) Take 720 mg by mouth in the morning and at bedtime.     ondansetron  (ZOFRAN -ODT) 4 MG disintegrating tablet Take 1 tablet (4 mg total) by mouth every 8 (eight) hours as needed for nausea or vomiting. 20 tablet 0   pantoprazole  (PROTONIX ) 40 MG tablet Take 1 tablet every day by oral route as needed. 30 tablet 3   sacubitril -valsartan  (ENTRESTO ) 24-26 MG Take 1 tablet by mouth 2 (two) times daily. 60 tablet 11   spironolactone  (ALDACTONE ) 25 MG tablet Take 1 tablet (25 mg total) by mouth daily. 90 tablet 3   tamsulosin  (FLOMAX ) 0.4 MG CAPS capsule Take 1 capsule (0.4 mg total) by mouth 2 (two) times daily. 120 capsule 3   No current facility-administered medications for this visit.   Allergies  Allergen Reactions   Codeine Nausea Only   Levaquin  [Levofloxacin  In D5w] Diarrhea    Social History   Socioeconomic History   Marital status: Single    Spouse name: Not on file   Number of children: Not on file   Years of education: Not on file   Highest education level: Not on file  Occupational History   Occupation: unemployed  Tobacco Use   Smoking status: Never   Smokeless tobacco: Never  Vaping Use   Vaping status: Never Used  Substance and Sexual Activity   Alcohol use: No   Drug use: No   Sexual activity: Not Currently  Other Topics Concern   Not on file  Social History Narrative   Pt with delayed mental capabilities, 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  Lives with parents.   Social Drivers of Corporate investment banker Strain: Low Risk  (10/11/2019)   Overall Financial Resource Strain (CARDIA)    Difficulty of Paying Living Expenses: Not hard at all  Food Insecurity: No Food Insecurity (05/28/2023)   Hunger Vital Sign    Worried About Running Out of Food in the Last Year: Never true    Ran Out of Food in the Last Year: Never true  Transportation Needs: No Transportation Needs (05/28/2023)   PRAPARE - Administrator, Civil Service (Medical): No    Lack of Transportation (Non-Medical): No  Physical Activity: Sufficiently Active (10/11/2019)   Exercise Vital Sign    Days of Exercise per Week: 5 days  Minutes of Exercise per Session: 40 min  Stress: Stress Concern Present (10/11/2019)   [employer] of Occupational Health - Occupational Stress Questionnaire    Feeling of Stress : To some extent  Social Connections: Not on file  Intimate Partner Violence: Not At Risk (05/28/2023)   Humiliation, Afraid, Rape, and Kick questionnaire    Fear of Current or Ex-Partner: No    Emotionally Abused: No    Physically Abused: No    Sexually Abused: No   Family History  Problem Relation Age of Onset   Heart failure Mother    Atrial fibrillation Mother    Heart failure Maternal Grandmother    Atrial fibrillation Maternal Grandmother    There were no vitals taken for this visit.  Wt Readings from Last 3 Encounters:  03/16/24 82.9 kg (182 lb 12.8 oz)  12/15/23 77 kg (169 lb 12.8 oz)  09/28/23 75 kg (165 lb 6.4 oz)   PHYSICAL EXAM: General: Well appearing. No distress on RA Cardiac: JVP ***. S1 and S2 present. No murmurs or rub. Resp: Lung sounds clear and equal B/L Abdomen: Soft, non-tender, non-distended.  Extremities: Warm and dry.  *** edema.  Neuro: Alert and oriented x3. Affect pleasant. Moves all extremities without difficulty.  Device Interrogation: HL score 7, mean HR 69, activity level 1.4 h/d, 93%  LV paced, AT/AF 0hrs  (personally reviewed)   ASSESSMENT & PLAN: 1. Chronic Systolic Heart Failure  - NICM, severity out of proportion to degree of CAD, likely valvular CM in setting of severe AI +/- LBBB CM  - Echo 9/23 EF 25-30%, LV severely dilated, severe AI, Global HK, RV ok  - s/p bioprosthetic AVR + CABG 9/23; epicardial CRT wires placed during surgery. - Echo (5/24): EF 20-25%, RV moderately reduced, normal functioning aortic valve prosthesis - S/p CRT-D 08/24. - Echo (11/24): improved EF 40-45, nl RV, no AI/AS, aorta dilation 42mm. - NYHA I-II. Volume mildly elevated on exam and device interrogation. HL score 7 - Continue Lasix  PRN but I asked him to take 40 mg today and tomorrow and to wear his compression socks. Back to PRN after that.  - Continue spiro 25 mg daily - Continue Entresto  24/26 mg bid. No BP room for GDMT titration. - Continue Coreg  3.125 mg BID - No SGLT2i given h/o urinary retention/UTI and sacral wound hx  - Check BMP/BNP today    2. Ascending Aortic Aneurysm/ Severe AI  - s/p Bentall with bioprosthetic aortic valve 9/23. - Echo 11/23, 5/24, and 11/24 normal functioning aortic valve prosthesis.  3. CAD - s/p CABG x 1 (SVG-RCA) in 09/23 - Stable w/o CP  - Continue ASA + statin + ? blocker   4. LBBB - S/p CRT-D - Device interrogation today shows 93% LV pacing   5. Paroxysmal Atrial fib/flutter - s/p DCCV 9/23 - Regular on exam - 0 AT/AF burden on device interrogation - Continue Eliquis  5 mg bid. No bleeding issues.  6. Mitral Regurgitation  - Mod-severe on echo 10/23, in setting of LBBB. - no MR on echo 11/24  Follow up: in 2 months with APP to reassess volume.  Follow up in *** with ***  [place] Aidan Caloca, NP  4:24 PM

## 2024-05-16 ENCOUNTER — Inpatient Hospital Stay (HOSPITAL_COMMUNITY)
Admission: RE | Admit: 2024-05-16 | Discharge: 2024-05-16 | Disposition: A | Discharge status: Home / Self Care | Source: Ambulatory Visit

## 2024-05-26 ENCOUNTER — Other Ambulatory Visit (HOSPITAL_BASED_OUTPATIENT_CLINIC_OR_DEPARTMENT_OTHER): Payer: Self-pay

## 2024-05-29 ENCOUNTER — Ambulatory Visit (INDEPENDENT_AMBULATORY_CARE_PROVIDER_SITE_OTHER): Payer: Medicare Other

## 2024-05-29 DIAGNOSIS — I447 Left bundle-branch block, unspecified: Secondary | ICD-10-CM | POA: Diagnosis not present

## 2024-05-31 LAB — CUP PACEART REMOTE DEVICE CHECK
Battery Remaining Longevity: 102 mo
Battery Remaining Percentage: 100 %
Brady Statistic RA Percent Paced: 39 %
Brady Statistic RV Percent Paced: 95 %
Date Time Interrogation Session: 20250818003100
HighPow Impedance: 55 Ohm
Implantable Lead Connection Status: 753985
Implantable Lead Connection Status: 753985
Implantable Lead Connection Status: 753985
Implantable Lead Implant Date: 20230927
Implantable Lead Implant Date: 20240816
Implantable Lead Implant Date: 20240816
Implantable Lead Location: 753858
Implantable Lead Location: 753859
Implantable Lead Location: 753860
Implantable Lead Model: 511212
Implantable Lead Model: 672
Implantable Lead Model: 7841
Implantable Lead Serial Number: 1479461
Implantable Lead Serial Number: 238954
Implantable Lead Serial Number: 324875
Implantable Pulse Generator Implant Date: 20240816
Lead Channel Impedance Value: 427 Ohm
Lead Channel Impedance Value: 458 Ohm
Lead Channel Impedance Value: 668 Ohm
Lead Channel Pacing Threshold Amplitude: 0.4 V
Lead Channel Pacing Threshold Amplitude: 0.6 V
Lead Channel Pacing Threshold Amplitude: 1.7 V
Lead Channel Pacing Threshold Pulse Width: 0.4 ms
Lead Channel Pacing Threshold Pulse Width: 0.4 ms
Lead Channel Pacing Threshold Pulse Width: 0.4 ms
Lead Channel Setting Pacing Amplitude: 2.5 V
Lead Channel Setting Pacing Amplitude: 2.5 V
Lead Channel Setting Pacing Amplitude: 2.5 V
Lead Channel Setting Pacing Pulse Width: 0.4 ms
Lead Channel Setting Pacing Pulse Width: 0.4 ms
Lead Channel Setting Sensing Sensitivity: 0.5 mV
Lead Channel Setting Sensing Sensitivity: 1 mV
Pulse Gen Serial Number: 519842
Zone Setting Status: 755011

## 2024-06-02 ENCOUNTER — Ambulatory Visit: Payer: Self-pay | Admitting: Cardiology

## 2024-06-15 ENCOUNTER — Other Ambulatory Visit (HOSPITAL_BASED_OUTPATIENT_CLINIC_OR_DEPARTMENT_OTHER): Payer: Self-pay

## 2024-06-15 MED ORDER — NITROFURANTOIN MONOHYD MACRO 100 MG PO CAPS
100.0000 mg | ORAL_CAPSULE | Freq: Two times a day (BID) | ORAL | 0 refills | Status: DC
  Filled 2024-06-15: qty 14, 7d supply, fill #0

## 2024-06-23 ENCOUNTER — Other Ambulatory Visit (HOSPITAL_COMMUNITY): Payer: Self-pay | Admitting: Physician Assistant

## 2024-06-24 ENCOUNTER — Other Ambulatory Visit (HOSPITAL_BASED_OUTPATIENT_CLINIC_OR_DEPARTMENT_OTHER): Payer: Self-pay

## 2024-06-26 ENCOUNTER — Other Ambulatory Visit (HOSPITAL_BASED_OUTPATIENT_CLINIC_OR_DEPARTMENT_OTHER): Payer: Self-pay

## 2024-06-26 MED ORDER — CARVEDILOL 3.125 MG PO TABS
3.1250 mg | ORAL_TABLET | Freq: Two times a day (BID) | ORAL | 3 refills | Status: DC
  Filled 2024-06-26: qty 60, 30d supply, fill #0
  Filled 2024-07-28: qty 60, 30d supply, fill #1
  Filled 2024-09-01: qty 60, 30d supply, fill #2
  Filled 2024-09-29: qty 60, 30d supply, fill #3

## 2024-06-27 NOTE — Progress Notes (Signed)
 Advanced Heart Failure Clinic   PCP: Theo Iha, MD Primary Cardiologist: Annabella Scarce, MD  HF Cardiologist: Dr. Cherrie   HPI: 68 y.o. male with past medication history of mild cognitive impairment and anxiety. He was found to have severe LV dysfunction in setting of markedly dilated aortic root (67 mm) and severe AI (tricuspid AoV). Echo 9/23 EF 25-30%, LV severely dilated, severe AI, Global HK, RV ok. LBBB also noted. [facility] 9/23 showed single vessel CAD w/ 60-70% pRCA stenosis, patent LM, LAD and LCx. RHC w/ low filling pressures and preserved CO, CI 2.53.    Underwent Bentall procedure + CABG x 1, SVR-RCA, on 07/07/22. Hospital course complicated by post-cardiotomy shock slow to wean off pressors, severe ileus/constipation, urinary retention/ obstruction requiring placement of foley catheter, atrial fibrillation requiring DCCV, RUE PICC Line Associated DVT, deconditioning and sacral wound.   Echo 5/24: EF 20-25%, RV moderately reduced, normal functioning aortic valve prosthesis  Underwent placement of CRT-D on 05/28/23.  Echo 11/24: EF 40-45%, RV normal, no AI/AS, aorta dilation 42mm  Today he returns for HF follow up with his brother. Overall feeling fine. Previously enjoyed [place] bike [place] park and going to [place], but has not been very active since his mother passed. Off Entresto  for unclear reasons, thinks he has been taking Lasix  daily. Brother now helping with  meds since mother's passing. Denies palpitations, abnormal bleeding, CP, dizziness, edema, or PND/Orthopnea. Appetite ok. Taking all medications. Son of our patient, Roseline Cedar, who passed 05/2024.   Past Medical History:  Diagnosis Date   Anxiety    over surgery   Arthritis    BACK AND SHOULDER   Cognitive deficits    Congenital brain damage (HCC)    Constipation    Coronary artery disease    Expressive speech delay    History of kidney stones    Hypertension    LBBB (left bundle branch  block) 05/26/2022   Lower extremity edema 05/26/2022   Mental retardation    PERFORMS ADL'S WITH NO DIFFICULTY /  WORKS FOR FAMILY BUSINESS   Murmur 05/26/2022   On mechanically assisted ventilation (HCC)    Osteoarthritis of left hip 01/16/2014   Osteoarthritis of right hip 05/15/2014   Primary localized osteoarthrosis of left shoulder 09/21/2017   PVC (premature ventricular contraction) 05/26/2022   Right ureteral stone    Speech impediment    Thoracic ascending aortic aneurysm (HCC)    Umbilical hernia    Current Outpatient Medications  Medication Sig Dispense Refill   acetaminophen  (TYLENOL ) 650 MG CR tablet Take 650 mg by mouth every 8 (eight) hours as needed for pain.     amoxicillin  (AMOXIL ) 500 MG capsule Take 4 capsules (2,000 mg total) by mouth 1 hour prior to dental procedure. 12 capsule 0   apixaban  (ELIQUIS ) 5 MG TABS tablet Take 1 tablet (5 mg total) by mouth 2 (two) times daily. 60 tablet 11   ascorbic acid  (VITAMIN C ) 500 MG tablet Take 500 mg by mouth in the morning.     atorvastatin  (LIPITOR ) 80 MG tablet Take 1 tablet (80 mg total) by mouth daily. 90 tablet 3   B Complex Vitamins (VITAMIN B COMPLEX) TABS Take 1 tablet by mouth daily.     carvedilol  (COREG ) 3.125 MG tablet Take 1 tablet (3.125 mg total) by mouth 2 (two) times daily. 60 tablet 3   Cholecalciferol (VITAMIN D3) 250 MCG (10000 UT) capsule Take 10,000 Units by mouth in the morning.  Coenzyme Q10 (CO Q-10) 100 MG CAPS Take 100 mg by mouth in the morning and at bedtime.     furosemide  (LASIX ) 20 MG tablet Take 1 tablet (20 mg total) by mouth as needed for fluid or edema. Weight gain of 3 lb in 24 hours or 5 lb in a week 30 tablet 3   Magnesium  300 MG CAPS Take 300 mg by mouth daily.     melatonin 5 MG TABS Take 5 mg by mouth at bedtime as needed (sleep).     ondansetron  (ZOFRAN -ODT) 4 MG disintegrating tablet Take 1 tablet (4 mg total) by mouth every 8 (eight) hours as needed for nausea or vomiting. 20  tablet 0   spironolactone  (ALDACTONE ) 25 MG tablet Take 1 tablet (25 mg total) by mouth daily. 90 tablet 3   Omega-3 Fatty Acids (SUPER TWIN EPA/DHA PO) Take 720 mg by mouth in the morning and at bedtime.     No current facility-administered medications for this encounter.   Allergies  Allergen Reactions   Codeine Nausea Only   Levaquin  [Levofloxacin  In D5w] Diarrhea    Social History   Socioeconomic History   Marital status: Single    Spouse name: Not on file   Number of children: Not on file   Years of education: Not on file   Highest education level: Not on file  Occupational History   Occupation: unemployed  Tobacco Use   Smoking status: Never   Smokeless tobacco: Never  Vaping Use   Vaping status: Never Used  Substance and Sexual Activity   Alcohol use: No   Drug use: No   Sexual activity: Not Currently  Other Topics Concern   Not on file  Social History Narrative   Pt with delayed mental capabilities, 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   Social Drivers of Corporate investment banker Strain: Low Risk  (10/11/2019)   Overall Financial Resource Strain (CARDIA)    Difficulty of Paying Living Expenses: Not hard at all  Food Insecurity: No Food Insecurity (05/28/2023)   Hunger Vital Sign    Worried About Running Out of Food in the Last Year: Never true    Ran Out of Food in the Last Year: Never true  Transportation Needs: No Transportation Needs (05/28/2023)   PRAPARE - Administrator, Civil Service (Medical): No    Lack of Transportation (Non-Medical): No  Physical Activity: Sufficiently Active (10/11/2019)   Exercise Vital Sign    Days of Exercise per Week: 5 days    Minutes of Exercise per Session: 40 min  Stress: Stress Concern Present (10/11/2019)   [employer] of Occupational Health - Occupational Stress Questionnaire     Feeling of Stress : To some extent  Social Connections: Not on file  Intimate Partner Violence: Not At Risk (05/28/2023)   Humiliation, Afraid, Rape, and Kick questionnaire    Fear of Current or Ex-Partner: No    Emotionally Abused: No    Physically Abused: No    Sexually Abused: No   Family History  Problem Relation Age of Onset   Heart failure Mother    Atrial fibrillation Mother    Heart failure Maternal Grandmother    Atrial fibrillation Maternal Grandmother    BP 122/80   Pulse 62   Wt 80.2 kg (176 lb 12.8 oz)   SpO2  96%   BMI 28.54 kg/m   Wt Readings from Last 3 Encounters:  06/30/24 80.2 kg (176 lb 12.8 oz)  03/16/24 82.9 kg (182 lb 12.8 oz)  12/15/23 77 kg (169 lb 12.8 oz)   PHYSICAL EXAM: General:  NAD. No resp difficulty, walked into clinic HEENT: Normal Neck: Supple. No JVD. Cor: Regular rate & rhythm. No rubs, gallops or murmurs. Lungs: Clear Abdomen: Soft, nontender, nondistended.  Extremities: No cyanosis, clubbing, rash, 1+ ankle edema Neuro: Alert & oriented x 3, moves all 4 extremities w/o difficulty. Affect pleasant.  ECG (personally reviewed): AF, V-paced  Device Interrogation (personally reviewed from 06/24/24): HL score 0, mean HR 71, activity level 1.2 h/d, 96% LV paced, AT/AF 0 hrs    ASSESSMENT & PLAN: 1. Chronic Systolic Heart Failure  - NICM, severity out of proportion to degree of CAD, likely valvular CM in setting of severe AI +/- LBBB CM  - Echo 9/23 EF 25-30%, LV severely dilated, severe AI, Global HK, RV ok  - s/p bioprosthetic AVR + CABG 9/23; epicardial CRT wires placed during surgery. - Echo (5/24): EF 20-25%, RV moderately reduced, normal functioning aortic valve prosthesis - S/p CRT-D 08/24. - Echo (11/24): improved EF 40-45, nl RV, no AI/AS, aorta dilation 42mm. - NYHA I-II. Volume OK on exam and by device, has LE swelling likely venous insuff - Given Rx for compression hose - Continue compression hose - Restart Entresto  24/26 mg  bid - Continue Lasix  20 mg daily for now, may need to pull back. - Continue spiro 25 mg daily - Continue Coreg  3.125 mg bid - No SGLT2i given h/o urinary retention/UTI and sacral wound hx  - Labs today, repeat BMET in 10-14 days - Repeat echo next visit  2. Ascending Aortic Aneurysm/ Severe AI  - s/p Bentall with bioprosthetic aortic valve 9/23. - Echo 11/23, 5/24, and 11/24 normal functioning aortic valve prosthesis.  3. CAD - s/p CABG x 1 (SVG-RCA) in 09/23 - No chest pain  - Continue ASA + statin + ? blocker   4. LBBB - S/p CRT-D - Device interrogation today shows 96% LV pacing   5. Paroxysmal Atrial fib/flutter - s/p DCCV 9/23 - Regular on exam - 0 AT/AF burden on device interrogation - Continue Eliquis  5 mg bid. No bleeding issues. - CBC stable  6. Mitral Regurgitation  - Mod-severe on echo 10/23, in setting of LBBB. - no MR on echo 11/24  Follow up in 3 months with Dr. Cherrie + echo  Harlene CHRISTELLA Gainer, FNP  3:01 PM

## 2024-06-30 ENCOUNTER — Encounter (HOSPITAL_COMMUNITY): Payer: Self-pay

## 2024-06-30 ENCOUNTER — Ambulatory Visit (HOSPITAL_COMMUNITY)
Admission: RE | Admit: 2024-06-30 | Discharge: 2024-06-30 | Disposition: A | Discharge status: Home / Self Care | Source: Ambulatory Visit | Attending: Family Medicine | Admitting: Family Medicine

## 2024-06-30 ENCOUNTER — Other Ambulatory Visit (HOSPITAL_BASED_OUTPATIENT_CLINIC_OR_DEPARTMENT_OTHER): Payer: Self-pay

## 2024-06-30 VITALS — BP 122/80 | HR 62 | Wt 176.8 lb

## 2024-06-30 DIAGNOSIS — G3184 Mild cognitive impairment, so stated: Secondary | ICD-10-CM | POA: Insufficient documentation

## 2024-06-30 DIAGNOSIS — I447 Left bundle-branch block, unspecified: Secondary | ICD-10-CM | POA: Insufficient documentation

## 2024-06-30 DIAGNOSIS — Z952 Presence of prosthetic heart valve: Secondary | ICD-10-CM | POA: Diagnosis not present

## 2024-06-30 DIAGNOSIS — Z79899 Other long term (current) drug therapy: Secondary | ICD-10-CM | POA: Insufficient documentation

## 2024-06-30 DIAGNOSIS — F419 Anxiety disorder, unspecified: Secondary | ICD-10-CM | POA: Diagnosis present

## 2024-06-30 DIAGNOSIS — Z7901 Long term (current) use of anticoagulants: Secondary | ICD-10-CM | POA: Diagnosis not present

## 2024-06-30 DIAGNOSIS — I428 Other cardiomyopathies: Secondary | ICD-10-CM | POA: Diagnosis not present

## 2024-06-30 DIAGNOSIS — I340 Nonrheumatic mitral (valve) insufficiency: Secondary | ICD-10-CM | POA: Diagnosis not present

## 2024-06-30 DIAGNOSIS — Z951 Presence of aortocoronary bypass graft: Secondary | ICD-10-CM | POA: Insufficient documentation

## 2024-06-30 DIAGNOSIS — Z95810 Presence of automatic (implantable) cardiac defibrillator: Secondary | ICD-10-CM | POA: Insufficient documentation

## 2024-06-30 DIAGNOSIS — I5022 Chronic systolic (congestive) heart failure: Secondary | ICD-10-CM | POA: Diagnosis not present

## 2024-06-30 DIAGNOSIS — I2510 Atherosclerotic heart disease of native coronary artery without angina pectoris: Secondary | ICD-10-CM | POA: Insufficient documentation

## 2024-06-30 DIAGNOSIS — I480 Paroxysmal atrial fibrillation: Secondary | ICD-10-CM | POA: Insufficient documentation

## 2024-06-30 DIAGNOSIS — I5042 Chronic combined systolic (congestive) and diastolic (congestive) heart failure: Secondary | ICD-10-CM

## 2024-06-30 LAB — BASIC METABOLIC PANEL WITH GFR
Anion gap: 9 (ref 5–15)
BUN: 20 mg/dL (ref 8–23)
CO2: 23 mmol/L (ref 22–32)
Calcium: 9.2 mg/dL (ref 8.9–10.3)
Chloride: 104 mmol/L (ref 98–111)
Creatinine, Ser: 0.9 mg/dL (ref 0.61–1.24)
GFR, Estimated: >60 mL/min (ref >60)
Glucose, Bld: 95 mg/dL (ref 70–99)
Potassium: 4 mmol/L (ref 3.5–5.1)
Sodium: 136 mmol/L (ref 135–145)

## 2024-06-30 LAB — CBC
HCT: 40.3 % (ref 39.0–52.0)
Hemoglobin: 14.3 g/dL (ref 13.0–17.0)
MCH: 36 pg — ABNORMAL HIGH (ref 26.0–34.0)
MCHC: 35.5 g/dL (ref 30.0–36.0)
MCV: 101.5 fL — ABNORMAL HIGH (ref 80.0–100.0)
Platelets: 156 K/uL (ref 150–400)
RBC: 3.97 MIL/uL — ABNORMAL LOW (ref 4.22–5.81)
RDW: 13.4 % (ref 11.5–15.5)
WBC: 6.6 K/uL (ref 4.0–10.5)
nRBC: 0 % (ref 0.0–0.2)

## 2024-06-30 LAB — BRAIN NATRIURETIC PEPTIDE: B Natriuretic Peptide: 36.2 pg/mL (ref 0.0–100.0)

## 2024-06-30 MED ORDER — SACUBITRIL-VALSARTAN 24-26 MG PO TABS
1.0000 | ORAL_TABLET | Freq: Two times a day (BID) | ORAL | 5 refills | Status: AC
  Filled 2024-06-30: qty 60, 30d supply, fill #0
  Filled 2024-08-17: qty 60, 30d supply, fill #1
  Filled 2024-09-18: qty 60, 30d supply, fill #2
  Filled 2024-10-20: qty 60, 30d supply, fill #3
  Filled 2024-11-16: qty 60, 30d supply, fill #4

## 2024-06-30 MED ORDER — FUROSEMIDE 20 MG PO TABS
20.0000 mg | ORAL_TABLET | Freq: Every day | ORAL | 3 refills | Status: AC
  Filled 2024-06-30: qty 30, 30d supply, fill #0
  Filled 2024-10-10: qty 30, 30d supply, fill #1

## 2024-06-30 NOTE — Patient Instructions (Addendum)
 Good to see you today!    RESTART Entresto  24/26 mg Twice daily  CHANGE Lasix  to 20 mg  daily  Miralax  1 capful daily for constipation  Your physician has requested that you have an echocardiogram. Echocardiography is a painless test that uses sound waves to create images of your heart. It provides your doctor with information about the size and shape of your heart and how well your heart's chambers and valves are working. This procedure takes approximately one hour. There are no restrictions for this procedure. Please do NOT wear cologne, perfume, aftershave, or lotions (deodorant is allowed). Please arrive 15 minutes prior to your appointment time.  Please note: We ask at that you not bring children with you during ultrasound (echo/ vascular) testing. Due to room size and safety concerns, children are not allowed in the ultrasound rooms during exams. Our front office staff cannot provide observation of children in our lobby area while testing is being conducted. An adult accompanying a patient to their appointment will only be allowed in the ultrasound room at the discretion of the ultrasound technician under special circumstances. We apologize for any inconvenience.  WEAR compression hose daily  On in the morning  off in the evening  Labs done today, your results will be available in MyChart, we will contact you for abnormal readings.  REPEAT Lab work in 2 weeks  Your physician recommends that you schedule a follow-up appointment in: as scheduled  If you have any questions or concerns before your next appointment please send us  a message through [place] or call our office at [phone].    TO LEAVE A MESSAGE FOR THE NURSE SELECT OPTION 2, PLEASE LEAVE A MESSAGE INCLUDING: YOUR NAME DATE OF BIRTH CALL BACK NUMBER REASON FOR CALL**this is important as we prioritize the call backs  YOU WILL RECEIVE A CALL BACK THE SAME DAY AS LONG AS YOU CALL BEFORE 4:00 PM At the [facility]  Failure Clinic, you and your health needs are our priority. As part of our continuing mission to provide you with exceptional heart care, we have created designated Provider Care Teams. These Care Teams include your primary Cardiologist (physician) and Advanced Practice Providers (APPs- Physician Assistants and Nurse Practitioners) who all work together to provide you with the care you need, when you need it.   You may see any of the following providers on your designated Care Team at your next follow up: Dr Toribio Fuel Dr Ezra Shuck Dr. Ria Commander Dr. Morene Brownie Amy Lenetta, NP Caffie Shed, [place] [facility] [place], [place] Beckey Coe, NP [place] Lee, NP Ellouise Class, NP Tinnie Redman, PharmD Jaun Bash, PharmD   Please be sure to bring in all your medications bottles to every appointment.    Thank you for choosing [facility]

## 2024-07-03 NOTE — Addendum Note (Signed)
 Encounter addended by: Cherie Lasalle M, RN on: 07/03/2024 9:20 AM  Actions taken: Visit diagnoses modified, Order list changed, Diagnosis association updated

## 2024-07-04 ENCOUNTER — Ambulatory Visit (HOSPITAL_COMMUNITY): Payer: Self-pay | Admitting: Family Medicine

## 2024-07-05 NOTE — Progress Notes (Signed)
Remote ICD Transmission.

## 2024-07-14 ENCOUNTER — Ambulatory Visit (HOSPITAL_COMMUNITY)
Admission: RE | Admit: 2024-07-14 | Discharge: 2024-07-14 | Disposition: A | Discharge status: Home / Self Care | Source: Ambulatory Visit | Attending: Adult Health | Admitting: Adult Health

## 2024-07-14 DIAGNOSIS — I5022 Chronic systolic (congestive) heart failure: Secondary | ICD-10-CM | POA: Insufficient documentation

## 2024-07-14 LAB — BASIC METABOLIC PANEL WITH GFR
Anion gap: 7 (ref 5–15)
BUN: 14 mg/dL (ref 8–23)
CO2: 25 mmol/L (ref 22–32)
Calcium: 9.2 mg/dL (ref 8.9–10.3)
Chloride: 107 mmol/L (ref 98–111)
Creatinine, Ser: 0.76 mg/dL (ref 0.61–1.24)
GFR, Estimated: >60 mL/min (ref >60)
Glucose, Bld: 102 mg/dL — ABNORMAL HIGH (ref 70–99)
Potassium: 4.5 mmol/L (ref 3.5–5.1)
Sodium: 139 mmol/L (ref 135–145)

## 2024-07-17 ENCOUNTER — Encounter: Payer: Self-pay | Admitting: Family Medicine

## 2024-07-29 ENCOUNTER — Other Ambulatory Visit (HOSPITAL_BASED_OUTPATIENT_CLINIC_OR_DEPARTMENT_OTHER): Payer: Self-pay

## 2024-08-18 ENCOUNTER — Other Ambulatory Visit (HOSPITAL_BASED_OUTPATIENT_CLINIC_OR_DEPARTMENT_OTHER): Payer: Self-pay

## 2024-08-28 ENCOUNTER — Ambulatory Visit: Payer: Medicare Other

## 2024-08-28 DIAGNOSIS — I5022 Chronic systolic (congestive) heart failure: Secondary | ICD-10-CM

## 2024-08-30 LAB — CUP PACEART REMOTE DEVICE CHECK
Battery Remaining Longevity: 102 mo
Battery Remaining Percentage: 100 %
Brady Statistic RA Percent Paced: 37 %
Brady Statistic RV Percent Paced: 96 %
Date Time Interrogation Session: 20251117003100
HighPow Impedance: 51 Ohm
Implantable Lead Connection Status: 753985
Implantable Lead Connection Status: 753985
Implantable Lead Connection Status: 753985
Implantable Lead Implant Date: 20230927
Implantable Lead Implant Date: 20240816
Implantable Lead Implant Date: 20240816
Implantable Lead Location: 753858
Implantable Lead Location: 753859
Implantable Lead Location: 753860
Implantable Lead Model: 511212
Implantable Lead Model: 672
Implantable Lead Model: 7841
Implantable Lead Serial Number: 1479461
Implantable Lead Serial Number: 238954
Implantable Lead Serial Number: 324875
Implantable Pulse Generator Implant Date: 20240816
Lead Channel Impedance Value: 427 Ohm
Lead Channel Impedance Value: 436 Ohm
Lead Channel Impedance Value: 667 Ohm
Lead Channel Pacing Threshold Amplitude: 0.4 V
Lead Channel Pacing Threshold Amplitude: 0.5 V
Lead Channel Pacing Threshold Amplitude: 1.4 V
Lead Channel Pacing Threshold Pulse Width: 0.4 ms
Lead Channel Pacing Threshold Pulse Width: 0.4 ms
Lead Channel Pacing Threshold Pulse Width: 0.4 ms
Lead Channel Setting Pacing Amplitude: 2.5 V
Lead Channel Setting Pacing Amplitude: 2.5 V
Lead Channel Setting Pacing Amplitude: 2.5 V
Lead Channel Setting Pacing Pulse Width: 0.4 ms
Lead Channel Setting Pacing Pulse Width: 0.4 ms
Lead Channel Setting Sensing Sensitivity: 0.5 mV
Lead Channel Setting Sensing Sensitivity: 1 mV
Pulse Gen Serial Number: 519842
Zone Setting Status: 755011

## 2024-08-31 ENCOUNTER — Ambulatory Visit: Payer: Self-pay | Admitting: Cardiology

## 2024-08-31 NOTE — Progress Notes (Signed)
 Remote ICD Transmission

## 2024-09-18 ENCOUNTER — Telehealth: Payer: Self-pay

## 2024-09-18 NOTE — Telephone Encounter (Signed)
 Patient device alert transmission for decreased BVP.  On average 91%; however, recent dips in the 80s.  PVC's noted on presenting. Device appears to be working normally, no episodes.  HL score is 17 (21 in recent past).  Sees Dr. Cherrie on 09/22/24.   Patient is due this month for annual follow up with EP.  Forwarding to EP scheduling team to get in this month for routine follow up.

## 2024-09-22 ENCOUNTER — Ambulatory Visit (HOSPITAL_COMMUNITY)
Admission: RE | Admit: 2024-09-22 | Discharge: 2024-09-22 | Disposition: A | Source: Ambulatory Visit | Attending: Internal Medicine | Admitting: Internal Medicine

## 2024-09-22 ENCOUNTER — Other Ambulatory Visit (HOSPITAL_BASED_OUTPATIENT_CLINIC_OR_DEPARTMENT_OTHER): Payer: Self-pay | Admitting: Cardiovascular Disease

## 2024-09-22 ENCOUNTER — Ambulatory Visit (HOSPITAL_COMMUNITY)
Admission: RE | Admit: 2024-09-22 | Discharge: 2024-09-22 | Disposition: A | Source: Ambulatory Visit | Attending: Family Medicine | Admitting: Family Medicine

## 2024-09-22 VITALS — BP 112/70 | HR 77 | Wt 183.6 lb

## 2024-09-22 DIAGNOSIS — I340 Nonrheumatic mitral (valve) insufficiency: Secondary | ICD-10-CM | POA: Diagnosis not present

## 2024-09-22 DIAGNOSIS — I071 Rheumatic tricuspid insufficiency: Secondary | ICD-10-CM | POA: Diagnosis not present

## 2024-09-22 DIAGNOSIS — Z952 Presence of prosthetic heart valve: Secondary | ICD-10-CM | POA: Diagnosis not present

## 2024-09-22 DIAGNOSIS — I480 Paroxysmal atrial fibrillation: Secondary | ICD-10-CM | POA: Diagnosis not present

## 2024-09-22 DIAGNOSIS — I428 Other cardiomyopathies: Secondary | ICD-10-CM | POA: Diagnosis not present

## 2024-09-22 DIAGNOSIS — G3184 Mild cognitive impairment, so stated: Secondary | ICD-10-CM | POA: Diagnosis not present

## 2024-09-22 DIAGNOSIS — I110 Hypertensive heart disease with heart failure: Secondary | ICD-10-CM | POA: Diagnosis not present

## 2024-09-22 DIAGNOSIS — I371 Nonrheumatic pulmonary valve insufficiency: Secondary | ICD-10-CM | POA: Diagnosis not present

## 2024-09-22 DIAGNOSIS — I5042 Chronic combined systolic (congestive) and diastolic (congestive) heart failure: Secondary | ICD-10-CM | POA: Diagnosis present

## 2024-09-22 DIAGNOSIS — Z79899 Other long term (current) drug therapy: Secondary | ICD-10-CM | POA: Diagnosis not present

## 2024-09-22 DIAGNOSIS — Z953 Presence of xenogenic heart valve: Secondary | ICD-10-CM | POA: Diagnosis not present

## 2024-09-22 DIAGNOSIS — I5022 Chronic systolic (congestive) heart failure: Secondary | ICD-10-CM

## 2024-09-22 DIAGNOSIS — I447 Left bundle-branch block, unspecified: Secondary | ICD-10-CM | POA: Diagnosis not present

## 2024-09-22 DIAGNOSIS — I2510 Atherosclerotic heart disease of native coronary artery without angina pectoris: Secondary | ICD-10-CM | POA: Diagnosis not present

## 2024-09-22 DIAGNOSIS — Z95810 Presence of automatic (implantable) cardiac defibrillator: Secondary | ICD-10-CM | POA: Diagnosis not present

## 2024-09-22 DIAGNOSIS — Z951 Presence of aortocoronary bypass graft: Secondary | ICD-10-CM | POA: Diagnosis not present

## 2024-09-22 DIAGNOSIS — Z7901 Long term (current) use of anticoagulants: Secondary | ICD-10-CM | POA: Diagnosis not present

## 2024-09-22 LAB — ECHOCARDIOGRAM COMPLETE
AR max vel: 2.37 cm2
AV Area VTI: 2.65 cm2
AV Area mean vel: 2.15 cm2
AV Mean grad: 3.5 mmHg
AV Peak grad: 6.3 mmHg
Ao pk vel: 1.25 m/s
Area-P 1/2: 3.01 cm2
Calc EF: 45.5 %
MV VTI: 3.51 cm2
S' Lateral: 3.8 cm
Single Plane A2C EF: 39 %
Single Plane A4C EF: 56.2 %

## 2024-09-22 NOTE — Progress Notes (Signed)
 [facility]   PCP: Theo Iha, MD Primary Cardiologist: Annabella Scarce, MD  HF Cardiologist: Dr. Cherrie   HPI: 68 y.o. male with past medication history of mild cognitive impairment and anxiety. He was found to have severe LV dysfunction in setting of markedly dilated aortic root (67 mm) and severe AI (tricuspid AoV). Echo 9/23 EF 25-30%, LV severely dilated, severe AI, Global HK, RV ok. LBBB also noted. [facility] 9[facility]23 showed single vessel CAD w/ 60-70% pRCA stenosis, patent LM, LAD and LCx. RHC w/ low filling pressures and preserved CO, CI 2.53.    Underwent Bentall procedure + CABG x 1, SVR-RCA, on 07/07/22. Hospital course complicated by post-cardiotomy shock slow to wean off pressors, severe ileus/constipation, urinary retention/ obstruction requiring placement of foley catheter, atrial fibrillation requiring DCCV, RUE PICC Line Associated DVT, deconditioning and sacral wound.   Echo 5/24: EF 20-25%, RV moderately reduced, normal functioning aortic valve prosthesis  Underwent placement of CRT-D on 05/28/23.  Echo 11/24: EF 40-45%, RV normal, no AI/AS, aorta dilation 42mm  Today he returns for HF follow up with his brother. Feels great. [facility]. No CP or SOB. 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     apixaban  (ELIQUIS ) 5 MG TABS tablet Take 1 tablet (5 mg total) by mouth 2 (two) times daily. 60 tablet 11   ascorbic acid  (VITAMIN C ) 500 MG tablet Take 500 mg by mouth in the morning.     atorvastatin  (LIPITOR ) 80 MG tablet Take 1 tablet (80 mg total) by mouth daily. 90 tablet 3   B Complex Vitamins (VITAMIN B COMPLEX) TABS Take 1 tablet by mouth daily.     carvedilol  (COREG ) 3.125 MG tablet Take 1 tablet (3.125 mg total) by mouth 2 (two) times daily. 60 tablet 3   Cholecalciferol (VITAMIN D3) 250 MCG (10000 UT) capsule Take 10,000 Units by mouth in the morning.     Coenzyme Q10 (CO Q-10) 100 MG CAPS Take 100 mg by mouth in the morning and at bedtime.     Magnesium  300 MG CAPS Take 300 mg by mouth daily.     melatonin 5 MG TABS Take 5 mg by mouth at bedtime as needed (sleep).     Omega-3 Fatty Acids (SUPER TWIN EPA/DHA PO) Take 720 mg  by mouth in the morning and at bedtime.     sacubitril -valsartan  (ENTRESTO ) 24-26 MG Take 1 tablet by mouth 2 (two) times daily. 60 tablet 5   spironolactone  (ALDACTONE ) 25 MG tablet Take 1 tablet (25 mg total) by mouth daily. 90 tablet 3   amoxicillin  (AMOXIL ) 500 MG capsule Take 4 capsules (2,000 mg total) by mouth 1 hour prior to dental procedure. (Patient not taking: Reported on 09/22/2024) 12 capsule 0   furosemide  (LASIX ) 20 MG tablet Take 1 tablet (20 mg total) by mouth daily. Weight gain of 3 lb in 24 hours or 5 lb in a week (Patient not taking: Reported on 09/22/2024) 30 tablet 3   ondansetron  (ZOFRAN -ODT) 4 MG disintegrating tablet Take 1 tablet (4 mg total) by mouth every 8  (eight) hours as needed for nausea or vomiting. (Patient not taking: Reported on 09/22/2024) 20 tablet 0   No current facility-administered medications for this encounter.   Allergies  Allergen Reactions   Codeine Nausea Only   Levaquin  [Levofloxacin  In D5w] Diarrhea    Social History   Socioeconomic History   Marital status: Single    Spouse name: Not on file   Number of children: Not on file   Years of education: Not on file   Highest education level: Not on file  Occupational History   Occupation: unemployed  Tobacco Use   Smoking status: Never   Smokeless tobacco: Never  Vaping Use   Vaping status: Never Used  Substance and Sexual Activity   Alcohol use: No   Drug use: No   Sexual activity: Not Currently  Other Topics Concern   Not on file  Social History Narrative   Pt with delayed mental capabilities, 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  Lives with parents.   Social Drivers of Health   Tobacco Use: Low Risk (06/30/2024)   Patient History    Smoking Tobacco Use: Never    Smokeless Tobacco Use: Never    Passive Exposure: Not on file  Financial Resource Strain: Not on file  Food Insecurity: No Food Insecurity (05/28/2023)   Hunger Vital Sign    Worried About Running Out of Food in the Last Year: Never true    Ran Out of Food in the Last Year: Never true  Transportation Needs: No Transportation Needs (05/28/2023)   PRAPARE - Administrator, Civil Service (Medical): No    Lack of Transportation (Non-Medical): No  Physical Activity: Not on file  Stress: Not on file  Social Connections: Not on file  Intimate Partner Violence: Not At Risk (05/28/2023)   Humiliation, Afraid, Rape, and Kick questionnaire    Fear of Current or Ex-Partner: No    Emotionally Abused: No    Physically Abused: No    Sexually Abused: No  Depression (PHQ2-9): Low Risk (12/21/2022)    Depression (PHQ2-9)    PHQ-2 Score: 0  Alcohol Screen: Not on file  Housing: Low Risk (05/28/2023)   Housing    Last Housing Risk Score: 0  Utilities: Not At Risk (05/28/2023)   AHC Utilities    Threatened with loss of utilities: No  Health Literacy: Not on file   Family History  Problem Relation Age of Onset   Heart failure Mother    Atrial fibrillation Mother    Heart failure Maternal Grandmother    Atrial fibrillation Maternal Grandmother  BP 112/70   Pulse 77   Wt 83.3 kg (183 lb 9.6 oz)   SpO2 95%   BMI 29.63 kg/m   Wt Readings from Last 3 Encounters:  09/22/24 83.3 kg (183 lb 9.6 oz)  06/30/24 80.2 kg (176 lb 12.8 oz)  03/16/24 82.9 kg (182 lb 12.8 oz)   PHYSICAL EXAM: General:  Sitting up . No resp difficulty HEENT: normal Neck: supple. no JVD.  Cor: Regular rate & rhythm. No rubs, gallops or murmurs. Lungs: clear Abdomen: soft, nontender, nondistended.Good bowel sounds. Extremities: no cyanosis, clubbing, rash, edema Neuro: alert & orientedx3, cranial nerves grossly intact. moves all 4 extremities w/o difficulty. Affect pleasant  ECG (personally reviewed): AF, V-paced  Device Interrogation: HL score 16 No VT/AF  fluid up over Thanksgiving now back down. 94% biv pacing Personally reviewed   ASSESSMENT & PLAN: 1. Chronic Systolic Heart Failure  - NICM, severity out of proportion to degree of CAD, likely valvular CM in setting of severe AI +/- LBBB CM  - Echo 9/23 EF 25-30%, LV severely dilated, severe AI, Global HK, RV ok  - s/p bioprosthetic AVR + CABG 9/23; epicardial CRT wires placed during surgery. - Echo (5/24): EF 20-25%, RV moderately reduced, normal functioning aortic valve prosthesis - S/p CRT-D 08/24. - Echo (11/24): improved EF 40-45, nl RV, no AI/AS, aorta dilation 42mm. - Echo today 09/22/24 EF 45% Aov ok Personally reviewed - NYHA I Volume ok - Continue compression hose - Continue Entresto  24/26 mg bid - Continue Lasix  20 mg daily for now,  may need to pull back. - Continue spiro 25 mg daily - Continue Coreg  3.125 mg bid - ICD interrogated personally  2. Ascending Aortic Aneurysm/ Severe AI  - s/p Bentall with bioprosthetic aortic valve 9/23. - Echo today with normal valve function - Aware of SBE precautions  3. CAD - s/p CABG x 1 (SVG-RCA) in 09/23 - No chest pain  - Continue ASA + statin + ? blocker   4. LBBB - S/p CRT-D - ICD Interrogated as abvoe  5. Paroxysmal Atrial fib/flutter - s/p DCCV 9/23 - In NSR - No AT/AF burden on device interrogation - Continue Eliquis  5 mg bid. No bleeding  6. Mitral Regurgitation  - Mod-severe on echo 10/23, in setting of LBBB. - no MR on echo today   Toribio Fuel, MD  12:07 PM

## 2024-09-22 NOTE — Progress Notes (Signed)
°  Echocardiogram 2D Echocardiogram has been performed.  Norleen ORN [facility] 09/22/2024, 11:21 AM

## 2024-09-22 NOTE — Patient Instructions (Signed)
°  Follow-Up in: 6 months with Dr. Bensimhon PLEASE CALL OUR OFFICE AROUND MARCH/APRIL TO GET SCHEDULED FOR YOUR APPOINTMENT. PHONE NUMBER IS [phone] OPTION 2   At the [facility], you and your health needs are our priority. We have a designated team specialized in the treatment of Heart Failure. This Care Team includes your primary Heart Failure Specialized Cardiologist (physician), Advanced Practice Providers (APPs- Physician Assistants and Nurse Practitioners), and Pharmacist who all work together to provide you with the care you need, when you need it.   You may see any of the following providers on your designated Care Team at your next follow up:  Dr. Toribio Fuel Dr. Ezra Shuck Dr. Odis Brownie Greig Mosses, NP Caffie Shed, [place] [facility] [place], [place] Beckey Coe, NP Jordan Lee, NP Tinnie Redman, PharmD   Please be sure to bring in all your medications bottles to every appointment.   Need to Contact Us :  If you have any questions or concerns before your next appointment please send us  a message through [place] or call our office at [phone].    TO LEAVE A MESSAGE FOR THE NURSE SELECT OPTION 2, PLEASE LEAVE A MESSAGE INCLUDING: YOUR NAME DATE OF BIRTH CALL BACK NUMBER REASON FOR CALL**this is important as we prioritize the call backs  YOU WILL RECEIVE A CALL BACK THE SAME DAY AS LONG AS YOU CALL BEFORE 4:00 PM

## 2024-09-25 ENCOUNTER — Other Ambulatory Visit (HOSPITAL_BASED_OUTPATIENT_CLINIC_OR_DEPARTMENT_OTHER): Payer: Self-pay

## 2024-09-25 MED ORDER — ATORVASTATIN CALCIUM 80 MG PO TABS
80.0000 mg | ORAL_TABLET | Freq: Every day | ORAL | 0 refills | Status: DC
  Filled 2024-09-25: qty 30, 30d supply, fill #0

## 2024-09-26 NOTE — Telephone Encounter (Signed)
*  late entry*   Spoke w/ patient on 12/12 - he is scheduled to see Charlies Arthur, PA on 1/16.

## 2024-10-10 ENCOUNTER — Other Ambulatory Visit (HOSPITAL_BASED_OUTPATIENT_CLINIC_OR_DEPARTMENT_OTHER): Payer: Self-pay

## 2024-10-13 ENCOUNTER — Encounter: Payer: Self-pay | Admitting: Internal Medicine

## 2024-10-27 ENCOUNTER — Ambulatory Visit: Admitting: Physician Assistant

## 2024-10-27 ENCOUNTER — Other Ambulatory Visit (HOSPITAL_BASED_OUTPATIENT_CLINIC_OR_DEPARTMENT_OTHER): Payer: Self-pay | Admitting: Cardiovascular Disease

## 2024-10-27 ENCOUNTER — Other Ambulatory Visit: Payer: Self-pay

## 2024-10-27 ENCOUNTER — Other Ambulatory Visit (HOSPITAL_BASED_OUTPATIENT_CLINIC_OR_DEPARTMENT_OTHER): Payer: Self-pay

## 2024-10-27 MED ORDER — ATORVASTATIN CALCIUM 80 MG PO TABS
80.0000 mg | ORAL_TABLET | Freq: Every day | ORAL | 0 refills | Status: AC
  Filled 2024-10-27: qty 30, 30d supply, fill #0

## 2024-10-27 NOTE — Telephone Encounter (Signed)
 In accordance with refill protocols, please review and address the following requirements before this medication refill can be authorized:  Labs

## 2024-11-16 ENCOUNTER — Other Ambulatory Visit: Payer: Self-pay

## 2024-11-16 ENCOUNTER — Other Ambulatory Visit (HOSPITAL_COMMUNITY): Payer: Self-pay | Admitting: Physician Assistant

## 2024-11-16 ENCOUNTER — Other Ambulatory Visit (HOSPITAL_BASED_OUTPATIENT_CLINIC_OR_DEPARTMENT_OTHER): Payer: Self-pay

## 2024-11-16 MED ORDER — CARVEDILOL 3.125 MG PO TABS
3.1250 mg | ORAL_TABLET | Freq: Two times a day (BID) | ORAL | 3 refills | Status: AC
  Filled 2024-11-16: qty 60, 30d supply, fill #0

## 2024-11-17 ENCOUNTER — Other Ambulatory Visit (HOSPITAL_BASED_OUTPATIENT_CLINIC_OR_DEPARTMENT_OTHER): Payer: Self-pay

## 2024-11-17 ENCOUNTER — Other Ambulatory Visit (HOSPITAL_COMMUNITY): Payer: Self-pay | Admitting: Family Medicine

## 2024-11-17 MED ORDER — APIXABAN 5 MG PO TABS
5.0000 mg | ORAL_TABLET | Freq: Two times a day (BID) | ORAL | 11 refills | Status: AC
  Filled 2024-11-17: qty 60, 30d supply, fill #0

## 2024-11-24 ENCOUNTER — Ambulatory Visit: Admitting: Student
